# Patient Record
Sex: Female | Born: 1965 | Race: White | Hispanic: No | Marital: Married | Smoking: Current every day smoker
Health system: Southern US, Community
[De-identification: ages and names within clinical notes are randomized; demographics above are authoritative.]

## PROBLEM LIST (undated history)

## (undated) DIAGNOSIS — E282 Polycystic ovarian syndrome: Secondary | ICD-10-CM

## (undated) DIAGNOSIS — Z8719 Personal history of other diseases of the digestive system: Secondary | ICD-10-CM

## (undated) DIAGNOSIS — J189 Pneumonia, unspecified organism: Secondary | ICD-10-CM

## (undated) DIAGNOSIS — M1990 Unspecified osteoarthritis, unspecified site: Secondary | ICD-10-CM

## (undated) DIAGNOSIS — I6529 Occlusion and stenosis of unspecified carotid artery: Secondary | ICD-10-CM

## (undated) DIAGNOSIS — D649 Anemia, unspecified: Secondary | ICD-10-CM

## (undated) DIAGNOSIS — S41112S Laceration without foreign body of left upper arm, sequela: Secondary | ICD-10-CM

## (undated) DIAGNOSIS — F329 Major depressive disorder, single episode, unspecified: Secondary | ICD-10-CM

## (undated) DIAGNOSIS — E7800 Pure hypercholesterolemia, unspecified: Secondary | ICD-10-CM

## (undated) DIAGNOSIS — K219 Gastro-esophageal reflux disease without esophagitis: Secondary | ICD-10-CM

## (undated) DIAGNOSIS — T8859XA Other complications of anesthesia, initial encounter: Secondary | ICD-10-CM

## (undated) DIAGNOSIS — J45909 Unspecified asthma, uncomplicated: Secondary | ICD-10-CM

## (undated) DIAGNOSIS — I5032 Chronic diastolic (congestive) heart failure: Secondary | ICD-10-CM

## (undated) DIAGNOSIS — G4730 Sleep apnea, unspecified: Secondary | ICD-10-CM

## (undated) DIAGNOSIS — J40 Bronchitis, not specified as acute or chronic: Secondary | ICD-10-CM

## (undated) DIAGNOSIS — F419 Anxiety disorder, unspecified: Secondary | ICD-10-CM

## (undated) DIAGNOSIS — J449 Chronic obstructive pulmonary disease, unspecified: Secondary | ICD-10-CM

## (undated) DIAGNOSIS — R0609 Other forms of dyspnea: Secondary | ICD-10-CM

## (undated) DIAGNOSIS — M797 Fibromyalgia: Secondary | ICD-10-CM

## (undated) DIAGNOSIS — F32A Depression, unspecified: Secondary | ICD-10-CM

## (undated) DIAGNOSIS — R0600 Dyspnea, unspecified: Secondary | ICD-10-CM

## (undated) DIAGNOSIS — E669 Obesity, unspecified: Secondary | ICD-10-CM

## (undated) DIAGNOSIS — C801 Malignant (primary) neoplasm, unspecified: Secondary | ICD-10-CM

## (undated) DIAGNOSIS — I2510 Atherosclerotic heart disease of native coronary artery without angina pectoris: Secondary | ICD-10-CM

## (undated) DIAGNOSIS — B977 Papillomavirus as the cause of diseases classified elsewhere: Secondary | ICD-10-CM

## (undated) DIAGNOSIS — Z9289 Personal history of other medical treatment: Secondary | ICD-10-CM

## (undated) DIAGNOSIS — Z720 Tobacco use: Secondary | ICD-10-CM

## (undated) DIAGNOSIS — E785 Hyperlipidemia, unspecified: Secondary | ICD-10-CM

## (undated) DIAGNOSIS — I219 Acute myocardial infarction, unspecified: Secondary | ICD-10-CM

## (undated) DIAGNOSIS — I10 Essential (primary) hypertension: Secondary | ICD-10-CM

## (undated) HISTORY — PX: TONSILLECTOMY: SUR1361

## (undated) HISTORY — PX: CORONARY ANGIOPLASTY WITH STENT PLACEMENT: SHX49

## (undated) HISTORY — DX: Anemia, unspecified: D64.9

## (undated) HISTORY — PX: OTHER SURGICAL HISTORY: SHX169

## (undated) HISTORY — DX: Occlusion and stenosis of unspecified carotid artery: I65.29

---

## 1998-04-14 ENCOUNTER — Emergency Department (HOSPITAL_COMMUNITY): Admission: EM | Admit: 1998-04-14 | Discharge: 1998-04-14 | Payer: Self-pay | Admitting: Emergency Medicine

## 1998-05-24 ENCOUNTER — Encounter: Admission: RE | Admit: 1998-05-24 | Discharge: 1998-05-24 | Payer: Self-pay | Admitting: *Deleted

## 1999-01-24 ENCOUNTER — Encounter (HOSPITAL_COMMUNITY): Admission: RE | Admit: 1999-01-24 | Discharge: 1999-02-07 | Payer: Self-pay

## 1999-10-08 ENCOUNTER — Encounter: Payer: Self-pay | Admitting: Family Medicine

## 1999-10-08 ENCOUNTER — Encounter: Admission: RE | Admit: 1999-10-08 | Discharge: 1999-10-08 | Payer: Self-pay | Admitting: Family Medicine

## 2001-03-11 ENCOUNTER — Encounter: Payer: Self-pay | Admitting: Family Medicine

## 2001-03-11 ENCOUNTER — Encounter: Admission: RE | Admit: 2001-03-11 | Discharge: 2001-03-11 | Payer: Self-pay | Admitting: Family Medicine

## 2001-10-06 ENCOUNTER — Other Ambulatory Visit: Admission: RE | Admit: 2001-10-06 | Discharge: 2001-10-06 | Payer: Self-pay | Admitting: Gynecology

## 2003-03-03 ENCOUNTER — Other Ambulatory Visit: Admission: RE | Admit: 2003-03-03 | Discharge: 2003-03-03 | Payer: Self-pay | Admitting: Obstetrics and Gynecology

## 2003-04-04 ENCOUNTER — Ambulatory Visit (HOSPITAL_COMMUNITY): Admission: RE | Admit: 2003-04-04 | Discharge: 2003-04-04 | Payer: Self-pay | Admitting: Obstetrics and Gynecology

## 2003-04-04 ENCOUNTER — Encounter (INDEPENDENT_AMBULATORY_CARE_PROVIDER_SITE_OTHER): Payer: Self-pay

## 2003-05-11 ENCOUNTER — Other Ambulatory Visit (HOSPITAL_COMMUNITY): Admission: RE | Admit: 2003-05-11 | Discharge: 2003-05-22 | Payer: Self-pay | Admitting: Psychiatry

## 2003-11-16 ENCOUNTER — Encounter: Admission: RE | Admit: 2003-11-16 | Discharge: 2003-11-16 | Payer: Self-pay | Admitting: Psychiatry

## 2004-07-31 ENCOUNTER — Other Ambulatory Visit (HOSPITAL_COMMUNITY): Admission: RE | Admit: 2004-07-31 | Discharge: 2004-08-17 | Payer: Self-pay | Admitting: Psychiatry

## 2004-07-31 ENCOUNTER — Ambulatory Visit: Payer: Self-pay | Admitting: Psychiatry

## 2005-12-26 ENCOUNTER — Other Ambulatory Visit: Admission: RE | Admit: 2005-12-26 | Discharge: 2005-12-26 | Payer: Self-pay | Admitting: Obstetrics and Gynecology

## 2006-01-21 ENCOUNTER — Other Ambulatory Visit (HOSPITAL_COMMUNITY): Admission: RE | Admit: 2006-01-21 | Discharge: 2006-02-20 | Payer: Self-pay | Admitting: Psychiatry

## 2006-01-22 ENCOUNTER — Ambulatory Visit: Payer: Self-pay | Admitting: Psychiatry

## 2008-08-11 ENCOUNTER — Ambulatory Visit (HOSPITAL_COMMUNITY): Admission: RE | Admit: 2008-08-11 | Discharge: 2008-08-11 | Payer: Self-pay | Admitting: Obstetrics and Gynecology

## 2008-08-11 ENCOUNTER — Encounter (INDEPENDENT_AMBULATORY_CARE_PROVIDER_SITE_OTHER): Payer: Self-pay | Admitting: Obstetrics and Gynecology

## 2008-08-31 ENCOUNTER — Inpatient Hospital Stay (HOSPITAL_COMMUNITY): Admission: AD | Admit: 2008-08-31 | Discharge: 2008-09-01 | Payer: Self-pay | Admitting: Obstetrics and Gynecology

## 2008-09-01 IMAGING — US US PELVIS COMPLETE MODIFY
1 series · 13 of 25 positions shown · non-contrast
Comparison: None

CLINICAL DATA: Right lower quadrant pain.  Status post endometrial
ablation on [DATE].  Postop endometritis.

TRANSABDOMINAL AND TRANSVAGINAL ULTRASOUND OF PELVIS
TECHNIQUE: Both transabdominal and transvaginal ultrasound
examinations of the pelvis were performed including evaluation of
the uterus, ovaries, adnexal regions, and pelvic cul-de-sac.

[Series 1: us pelvis complete modify · 0.28mm/px · 13 of 51 slices shown]
[im 1/51]
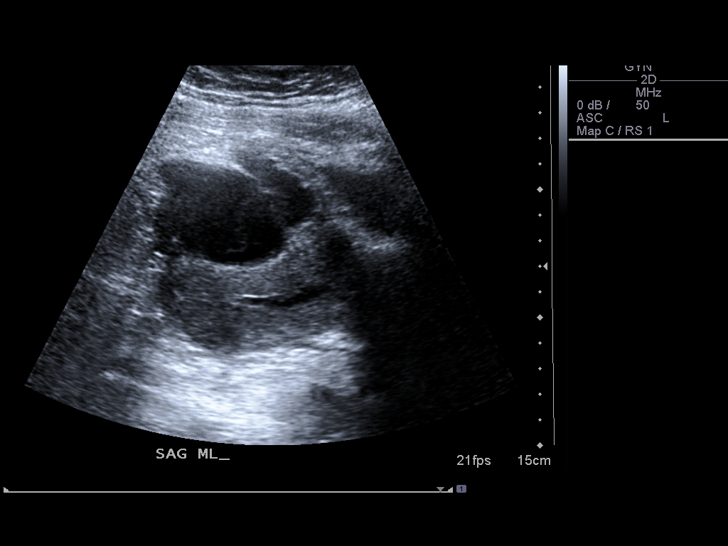
[im 5/51]
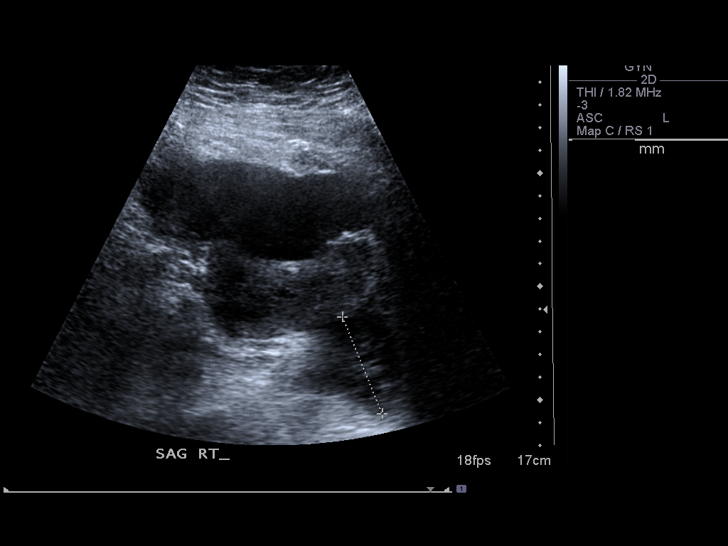
[im 9/51]
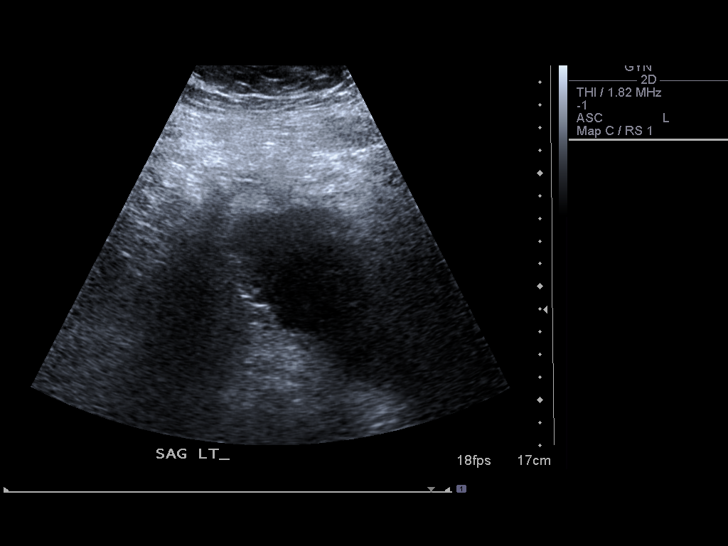
[im 13/51]
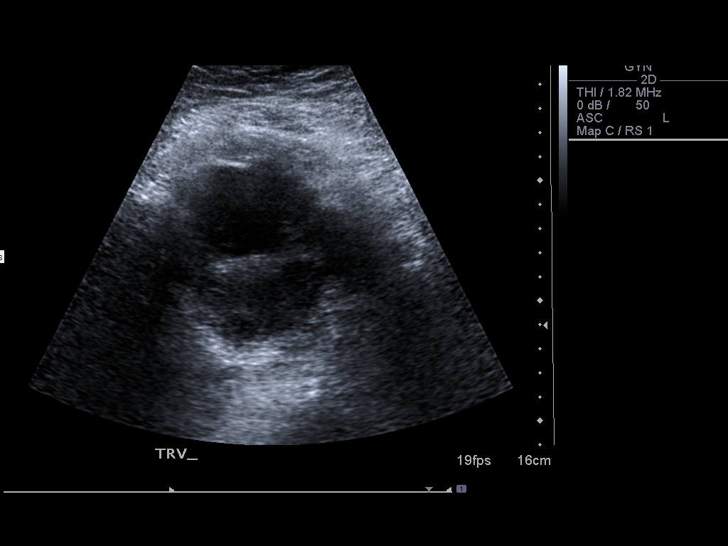
[im 17/51]
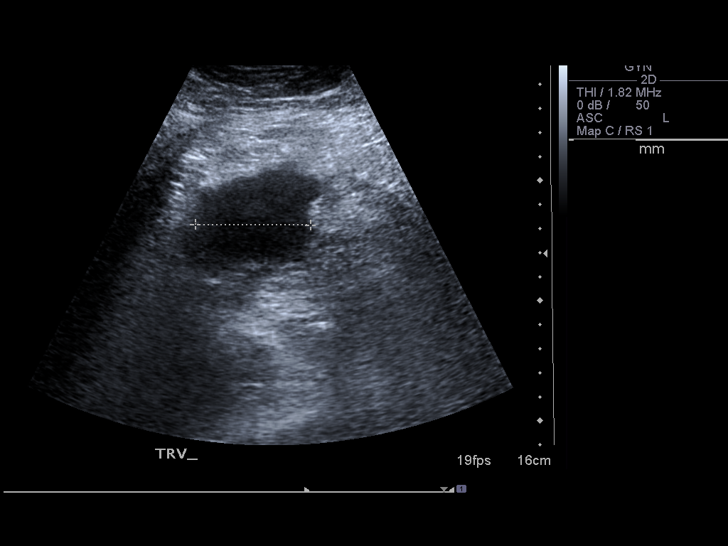
[im 21/51]
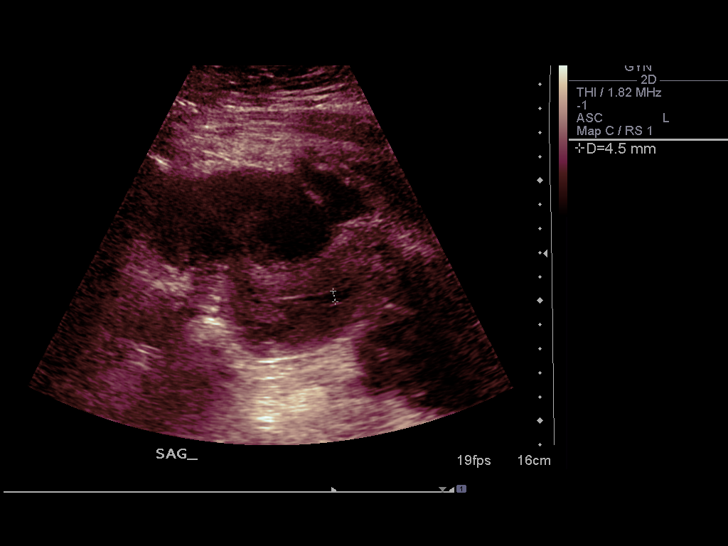
[im 26/51]
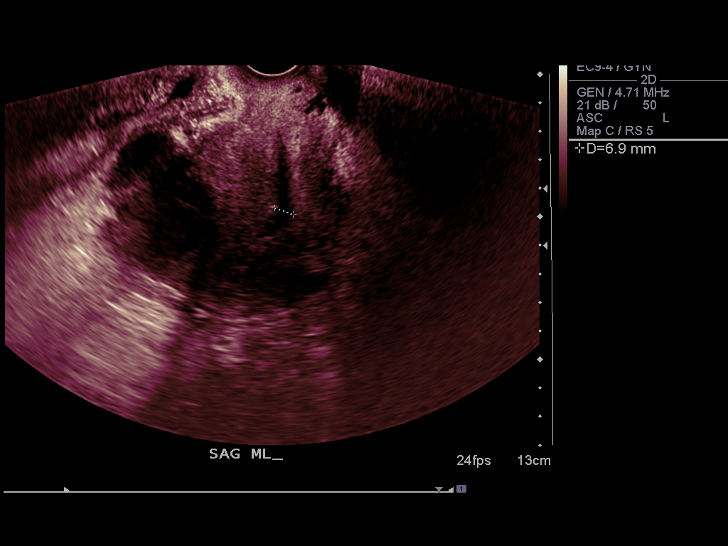
[im 30/51]
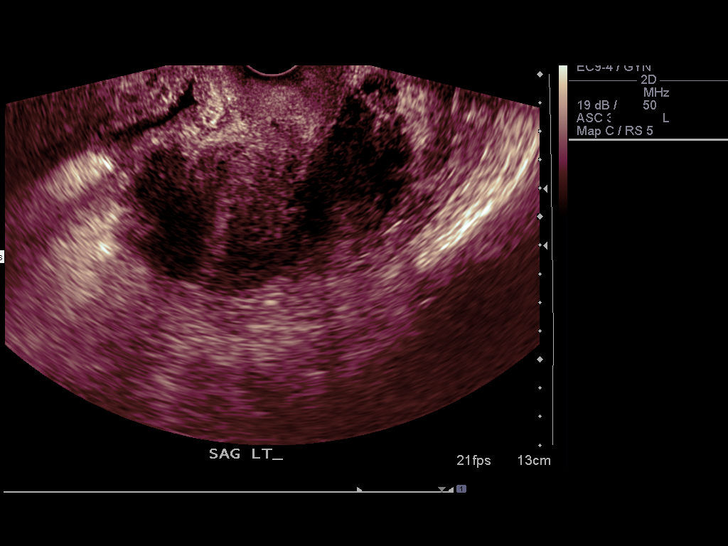
[im 34/51]
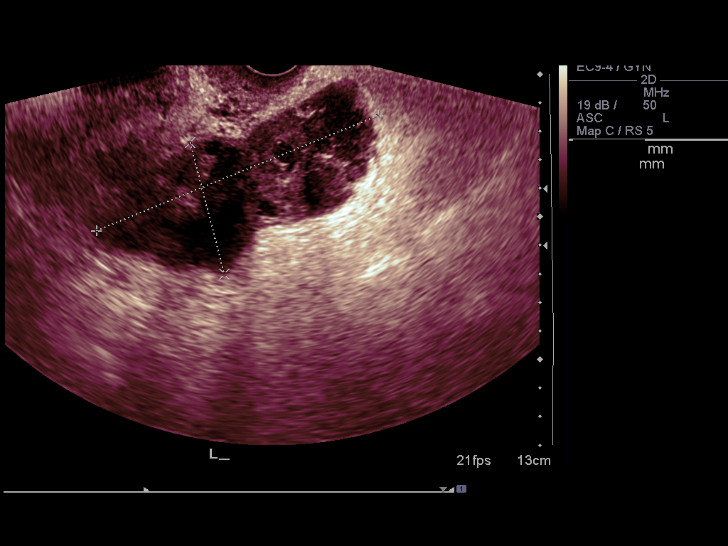
[im 38/51]
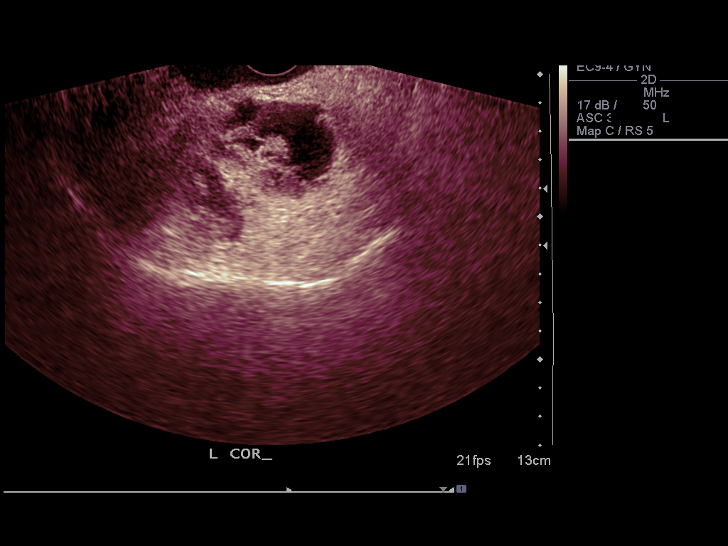
[im 42/51]
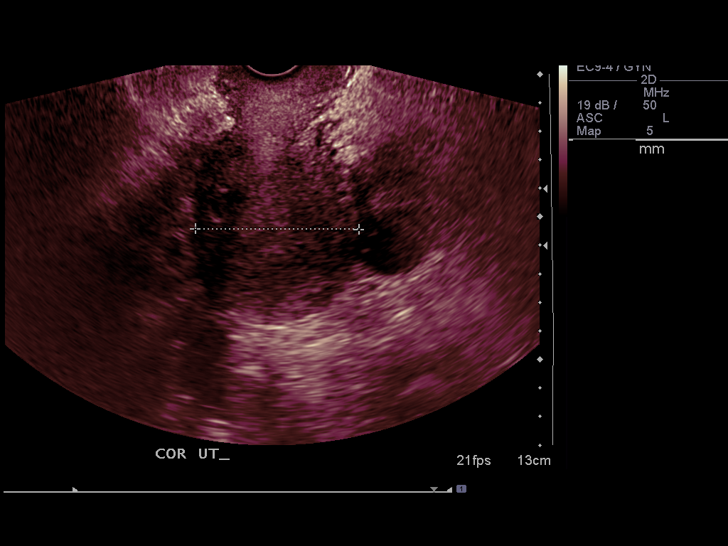
[im 46/51]
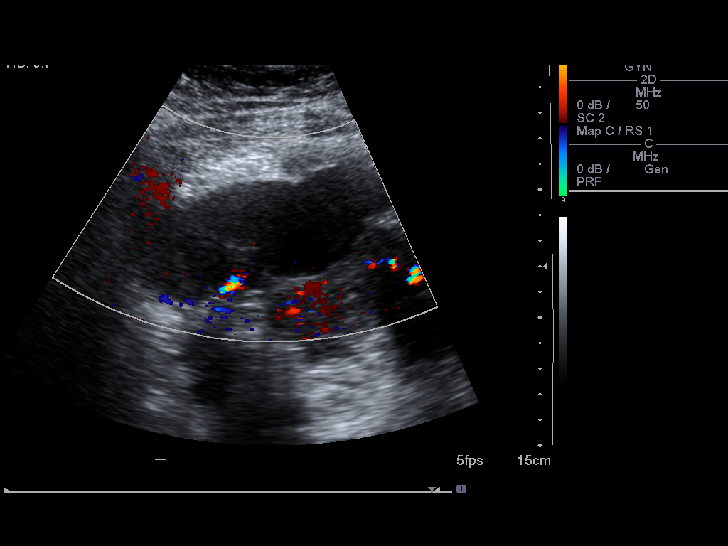
[im 51/51]
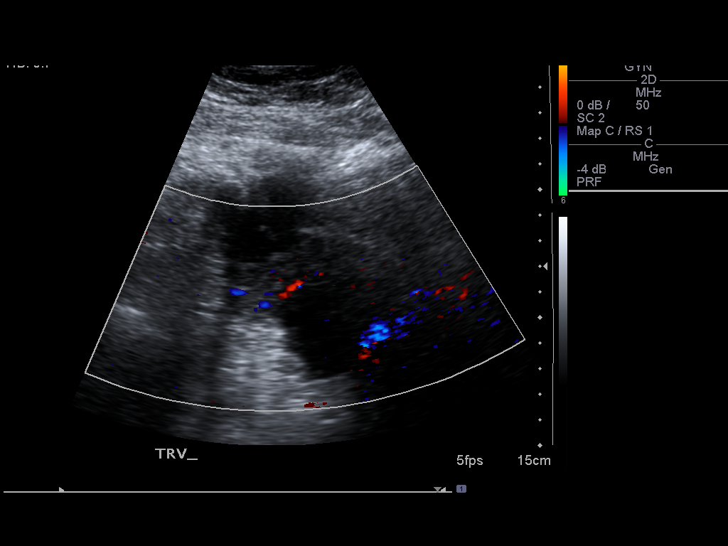

[13 of 25 positions shown; findings below may reference images not displayed]

FINDINGS: The uterus demonstrates a sagittal length of 10.1 cm, an
AP width of 5.8 cm and a transverse width of 5.7 cm.  One area of
focally altered echotexture is identified in the posterior upper
uterine segment portion of the myometrium measuring 1.9 x 1.5 cm
and is compatible with a small focal fibroid.

A small amount of fluid is identified within the endometrial canal.
The finding of note is the presence of bilateral adnexal
collections. On the left this measures 10.7 x 4.8 x 5.6 cm.  A
portion of this collection has imaging characteristics suggestive
of some ovarian tissue although a separate ovary cannot be clearly
delineated and the findings are most suggestive of a tubo-ovarian
abscess.

The collection on the right is located above the level of the
fundus of the uterus measuring 10.0 x 4.3 x 5.0 cm.  This
collection is more difficult to evaluate as it can be seen well
only transabdominally.  Given the findings on the other side, this
may represent a similar process on the right.

No free fluid is noted within the pelvis.
IMPRESSION: Small uterine fibroid with sizes location as noted above.  Small
amount of simple fluid within the endometrial canal in this patient
post endometrial ablation.

Bilateral adnexal collections  with the appearance on the left most
compatible with a tubo-ovarian abscess.  The right collection is
poorly evaluated as it is seen well only transabdominally but this
likely represents a similar process to that on the left.  If
desired, further evaluation with CT would likely be able to more
clearly delineate the extent of these bilateral collections.

Because of these findings, this report was discussed with Dr.
GLENROY at the time of this reading.

## 2009-08-09 ENCOUNTER — Encounter: Payer: Self-pay | Admitting: Cardiology

## 2009-09-04 ENCOUNTER — Ambulatory Visit: Payer: Self-pay | Admitting: Cardiology

## 2009-09-04 DIAGNOSIS — R079 Chest pain, unspecified: Secondary | ICD-10-CM

## 2009-09-04 DIAGNOSIS — R0602 Shortness of breath: Secondary | ICD-10-CM

## 2009-09-06 ENCOUNTER — Telehealth (INDEPENDENT_AMBULATORY_CARE_PROVIDER_SITE_OTHER): Payer: Self-pay

## 2009-09-10 ENCOUNTER — Ambulatory Visit: Payer: Self-pay | Admitting: Cardiology

## 2009-09-10 ENCOUNTER — Ambulatory Visit: Payer: Self-pay

## 2009-09-10 ENCOUNTER — Encounter (HOSPITAL_COMMUNITY): Admission: RE | Admit: 2009-09-10 | Discharge: 2009-09-28 | Payer: Self-pay | Admitting: Cardiology

## 2009-09-10 LAB — CONVERTED CEMR LAB
ALT: 18 units/L (ref 0–35)
AST: 18 units/L (ref 0–37)
Albumin: 3.9 g/dL (ref 3.5–5.2)
Alkaline Phosphatase: 67 units/L (ref 39–117)
BUN: 6 mg/dL (ref 6–23)
Bilirubin, Direct: 0 mg/dL (ref 0.0–0.3)
CO2: 27 meq/L (ref 19–32)
Calcium: 9.2 mg/dL (ref 8.4–10.5)
Chloride: 103 meq/L (ref 96–112)
Cholesterol: 294 mg/dL — ABNORMAL HIGH (ref 0–200)
Creatinine, Ser: 1.1 mg/dL (ref 0.4–1.2)
Direct LDL: 200.3 mg/dL
GFR calc non Af Amer: 56.98 mL/min (ref >60)
Glucose, Bld: 93 mg/dL (ref 70–99)
HDL: 39 mg/dL — ABNORMAL LOW (ref >39.00)
Potassium: 4.6 meq/L (ref 3.5–5.1)
Pro B Natriuretic peptide (BNP): 29 pg/mL (ref 0.0–100.0)
Sodium: 137 meq/L (ref 135–145)
Total Bilirubin: 0.7 mg/dL (ref 0.3–1.2)
Total CHOL/HDL Ratio: 8
Total Protein: 7.6 g/dL (ref 6.0–8.3)
Triglycerides: 244 mg/dL — ABNORMAL HIGH (ref 0.0–149.0)
VLDL: 48.8 mg/dL — ABNORMAL HIGH (ref 0.0–40.0)

## 2009-09-14 ENCOUNTER — Telehealth: Payer: Self-pay | Admitting: Cardiology

## 2009-10-09 ENCOUNTER — Encounter: Payer: Self-pay | Admitting: Cardiology

## 2010-10-31 NOTE — Letter (Signed)
Summary: [facility]   Imported By: Kassie Mends 10/16/2009 08:21:38  _____________________________________________________________________  External Attachment:    Type:   Image     Comment:   External Document

## 2010-10-31 NOTE — Letter (Signed)
Summary: letter to follow-up on B/P  [facility] [employer], Main Office  [address]   [place], [place] [address]   Phone: [phone]  Fax: [phone]        October 09, 2009 MRN: [id number]    [facility] [address] [place], [place]  [address]    Dear Ms. SASSANO,   I have been unable to reach you by telephone to see how your blood pressure has been. Please call me.        Sincerely,  Katina Dung, RN, BSN  This letter has been electronically signed by your physician.

## 2011-02-11 NOTE — H&P (Signed)
NAMEBETTEJANE, LEAVENS                 ACCOUNT NO.:  [id number]   MEDICAL RECORD NO.:  [id number]          PATIENT TYPE:  INP   LOCATION:  9306                          [facility]ILITY:  [facility]   PHYSICIAN:  Michelle L. Grewal, M.D.DATE OF BIRTH:  25-Apr-1966   DATE OF ADMISSION:  08/31/2008  DATE OF DISCHARGE:                              HISTORY & PHYSICAL   HISTORY OF PRESENT ILLNESS:  This 45 year old gravida 0 status post  diagnostic hysteroscopy, D&C, and ThermaChoice endometrial ablation on  August 11, 2008.  Surgery was uncomplicated.  The surgery was for  menorrhagia.  The patient several days after surgery had developed some  lower abdominal pain and low-grade fever.  She was given prescriptions  for antibiotics which she did not immediately take because of her  concern of how it might affect her GI tract.  She returned to the office  for a check up and was diagnosed with endometritis.  The patient was  given a dose of Rocephin and Toradol.  She was also given a prescription  for Keflex.  She was then advised to follow up within a few days. She  returned after few days reporting that she did not take the Keflex,  because she was concerned about taking antibiotics despite being  counseled to do so.  She was then given a prescription of Z-Pak at that  point.  She then took the Z-Pak.  She called in today complaining of a  sharp shooting pain from the right ovary, last night it started at 6  o'clock.  She also had a low-grade fever and then spiked the temperature  up to a 101.  She is having some yellowish vaginal discharge and some  urinary frequency.  She has to strain for her bowel movement and she is  having some constipation.  She feels lousy.   PAST MEDICAL HISTORY:  Unremarkable.   PAST SURGICAL HISTORY:  Tonsillectomy, hysteroscopy, D&C, and  ThermaChoice endometrial ablation.   MEDICATIONS:  Lexapro, Xanax, Vicodin, and Advil.   ALLERGIES:  She has no known allergies.   SOCIAL HISTORY:  She is not a smoker.   REVIEW OF SYSTEMS:  Unremarkable.   PHYSICAL EXAMINATION:  VITAL SIGNS:  Blood pressure 118/78 and  temperature 99.2.  Urine shows 1+ white blood cell and 1+ blood.  LUNGS:  Clear to auscultation bilaterally.  CARDIAC:  Regular rate and rhythm.  ABDOMEN:  Soft.  Some mild tenderness in the lower quadrant, but no  rebound.  No guarding.  Nondistended.  She has good bowel sounds.  PELVIC:  External genitalia within normal limits.  Vagina, moderate  amount of yellow discharge noted.  Cervix appears normal.  Bimanual  exam, moderate cervical motion tenderness.  Uterus is very tender with  movement and she has bilateral adnexal tenderness.   IMPRESSION:  1. Status post hysteroscopy, ThermaChoice endometrial ablation, and      dilation and curettage.  2. Endometritis/pelvic inflammatory disease.   PLAN:  Admit to [facility] for IV ampicillin, gentamicin, and  clindamycin.  I have been concerned about the patient's compliance with  outpatient oral antibiotic use.  So, I will admit her for IV  antibiotics.  Also, would give her Toradol tonight.  We will obtain an  admission CBC with differential and a repeat in the morning.  Obtain  pelvic ultrasound tomorrow.  Also, we will send urine for culture and  sensitivity and we will perform sitz bath tonight.  Plan of care  discussed with the patient.  All questions were answered.      Michelle L. Vincente Poli, M.D.  Electronically Signed     MLG/MEDQ  D:  08/31/2008  T:  09/01/2008  Job:  [id number]

## 2011-02-14 NOTE — Op Note (Signed)
Shannon [name], Shannon [name]                 ACCOUNT NO.:  [id number]   MEDICAL RECORD NO.:  [id number]          PATIENT TYPE:  AMB   LOCATION:  SDC                           [facility]  [facility]   PHYSICIAN:  Michelle L. Grewal, M.D.DATE OF BIRTH:  01/03/1966   DATE OF PROCEDURE:  08/14/2008  DATE OF DISCHARGE:                               OPERATIVE REPORT   PREOPERATIVE DIAGNOSIS:  Menorrhagia.   POSTOPERATIVE DIAGNOSIS:  Menorrhagia.   PROCEDURE:  D&C hysteroscopy, ThermaChoice endometrial ablation.   SURGEON:  Michelle L. Vincente Poli, MD   ANESTHESIA:  MAC with local.   FINDINGS:  Normal cavity.   SPECIMEN:  Uterine curettings sent to Pathology.   ESTIMATED BLOOD LOSS:  Minimal.   COMPLICATIONS:  None.   PROCEDURE:  The patient was taken to the operating room.  Anesthesia was  administered.  She was prepped and draped in usual fashion.  In-and-out  catheter was used to empty the bladder.  Speculum was inserted to the  vagina.  The cervix was grasped with a tenaculum and paracervical block  was performed in standard fashion.  The cervical internal os was gently  dilated using Pratt dilators.  The hysteroscope was inserted to the  uterine cavity with excellent visualization.  I found no intracavitary  lesion.  The hysteroscope was removed.  A sharp curette was inserted and  a thorough sharp uterine curettage was performed.  All tissue was sent  to Pathology.  The ThermaChoice III balloon was inserted and  ThermaChoice endometrial ablation was performed for 8 minutes according  to [employer] specifications.  The intact balloon was removed.  All  sponge, lap, and instrument counts were correct x2.  The patient went to  recovery room in stable condition.      Michelle L. Vincente Poli, M.D.  Electronically Signed     MLG/MEDQ  D:  08/11/2008  T:  08/11/2008  Job:  [id number]

## 2011-02-14 NOTE — Op Note (Signed)
NAME:  Shannon [name], Shannon [name]                           ACCOUNT NO.:  [id number]   MEDICAL RECORD NO.:  [id number]                   PATIENT TYPE:  AMB   LOCATION:  SDC                                  [facility]   PHYSICIAN:  Michelle L. Vincente Poli, M.D.            DATE OF BIRTH:  1965/12/10   DATE OF PROCEDURE:  04/04/2003  DATE OF DISCHARGE:                                 OPERATIVE REPORT   PREOPERATIVE DIAGNOSES:  1. Cervical intraepithelial neoplasia III.  2. Endometrial polyp.   POSTOPERATIVE DIAGNOSES:  1. Cervical intraepithelial neoplasia III.  2. Endometrial polyp.   PROCEDURE:  Wide local excision of CIN III lesion on right vulva, removal of  upper segmental  lesion of left cheek next to vulva in the ___________.   SURGEON:  Michelle L. Vincente Poli, M.D.   ANESTHESIA:  MAC with local.   ESTIMATED BLOOD LOSS:  Minimal.   FINDINGS:  1. The bump of tissue was low to the vagina.  2. Lesion on right vulva.  3. Small hyperpigmented lesion on left cheek.   COMPLICATIONS:  None.   PROCEDURE:  The patient was taken to the operating room.  She was given  sedation and placed in the low lithotomy position.  The abs, lower abdomen,  vagina, and vulva were prepped and draped in the usual sterile fashion.  In-  and-out catheter was used to empty the bladder.  Speculum was inserted into  the vagina and cervix was grasped with a tenaculum in a standard fashion.  The cervical internal os was gently dilated with the [facility] dilators and  diagnostic hysteroscope was inserted into the uterus.  Despite good  distention there was a moderate amount of blood clot in the uterus, so  visualization was limited, but there was some abundant tissue noted.  The  hysteroscope was removed and sharp curette was used to thoroughly curette  the uterus and soft tissue.  The soft tissue was sent to pathology.  The  speculum was removed.  All instruments were removed from the vagina.  Attention was then turned  to the vulva.   Exam of the vulva revealed a previous lesion from which I biopsied right  _______ at approximately 7 o'clock.  The local was infiltrated around it,  and using the scalpel it was removed in its entirety with a good margin.  This was sent to pathology for analysis.  The base of the wound was then  [facility] for hemostasis and then using 3-0 Vicryl interrupted the skin was  closed.  Bacitracin was then placed at the incision.   Attention was then turned to the left cheek at approximately 7 o'clock on  the left side, just a little bit further out from the vulva.  There was a  small 0.5 cm hyperpigmented lesion, and after local was infiltrated, it was  excised and the base was Bovied and then Bacitracin was applied  to this.  The patient was taken to the recovery room in stable condition.  All sponge,  lap, and instrument counts were correct x 2.  She will have an ice pack  placed to the vulva and follow up with me in one week.                                               Michelle L. Vincente Poli, M.D.    Florestine Avers  D:  04/04/2003  T:  04/04/2003  Job:  [id number]

## 2011-03-08 ENCOUNTER — Emergency Department (HOSPITAL_COMMUNITY)
Admission: EM | Admit: 2011-03-08 | Discharge: 2011-03-08 | Disposition: A | Discharge status: Home / Self Care | Payer: BC Managed Care – PPO | Attending: Emergency Medicine | Admitting: Emergency Medicine

## 2011-03-08 DIAGNOSIS — R112 Nausea with vomiting, unspecified: Secondary | ICD-10-CM | POA: Insufficient documentation

## 2011-03-08 DIAGNOSIS — N12 Tubulo-interstitial nephritis, not specified as acute or chronic: Secondary | ICD-10-CM | POA: Insufficient documentation

## 2011-03-08 DIAGNOSIS — I10 Essential (primary) hypertension: Secondary | ICD-10-CM | POA: Insufficient documentation

## 2011-03-08 DIAGNOSIS — R300 Dysuria: Secondary | ICD-10-CM | POA: Insufficient documentation

## 2011-03-08 DIAGNOSIS — R6883 Chills (without fever): Secondary | ICD-10-CM | POA: Insufficient documentation

## 2011-03-08 LAB — URINALYSIS, ROUTINE W REFLEX MICROSCOPIC
Glucose, UA: NEGATIVE mg/dL
Protein, ur: NEGATIVE mg/dL
pH: 6 (ref 5.0–8.0)

## 2011-03-08 LAB — BASIC METABOLIC PANEL
CO2: 20 mEq/L (ref 19–32)
Calcium: 8.6 mg/dL (ref 8.4–10.5)
Chloride: 97 mEq/L (ref 96–112)
Creatinine, Ser: 1.1 mg/dL (ref 0.4–1.2)
Glucose, Bld: 122 mg/dL — ABNORMAL HIGH (ref 70–99)
Sodium: 129 mEq/L — ABNORMAL LOW (ref 135–145)

## 2011-03-08 LAB — URINE MICROSCOPIC-ADD ON

## 2011-03-08 LAB — POCT PREGNANCY, URINE: Preg Test, Ur: NEGATIVE

## 2011-03-11 LAB — URINE CULTURE

## 2011-07-01 LAB — CBC
Hemoglobin: 14.1
MCHC: 32.7
RBC: 5.25 — ABNORMAL HIGH
WBC: 8.7

## 2011-07-03 LAB — COMPREHENSIVE METABOLIC PANEL
BUN: 7 mg/dL (ref 6–23)
CO2: 25 mEq/L (ref 19–32)
Calcium: 8.5 mg/dL (ref 8.4–10.5)
Chloride: 97 mEq/L (ref 96–112)
Creatinine, Ser: 0.81 mg/dL (ref 0.4–1.2)
GFR calc non Af Amer: >60 mL/min (ref >60)
Glucose, Bld: 100 mg/dL — ABNORMAL HIGH (ref 70–99)
Total Bilirubin: 0.4 mg/dL (ref 0.3–1.2)

## 2011-07-03 LAB — URINALYSIS, ROUTINE W REFLEX MICROSCOPIC
Bilirubin Urine: NEGATIVE
Nitrite: POSITIVE — AB
Specific Gravity, Urine: 1.025 (ref 1.005–1.030)
Urobilinogen, UA: 0.2 mg/dL (ref 0.0–1.0)
pH: 5.5 (ref 5.0–8.0)

## 2011-07-03 LAB — DIFFERENTIAL
Basophils Absolute: 0.1 10*3/uL (ref 0.0–0.1)
Basophils Relative: 1 % (ref 0–1)
Eosinophils Absolute: 0.1 10*3/uL (ref 0.0–0.7)
Eosinophils Absolute: 0.1 10*3/uL (ref 0.0–0.7)
Eosinophils Relative: 1 % (ref 0–5)
Lymphocytes Relative: 17 % (ref 12–46)
Lymphs Abs: 2.3 10*3/uL (ref 0.7–4.0)
Monocytes Absolute: 1.1 10*3/uL — ABNORMAL HIGH (ref 0.1–1.0)
Monocytes Relative: 8 % (ref 3–12)
Neutro Abs: 8.3 10*3/uL — ABNORMAL HIGH (ref 1.7–7.7)
Neutrophils Relative %: 75 % (ref 43–77)

## 2011-07-03 LAB — URINE CULTURE: Colony Count: >=100000

## 2011-07-03 LAB — URINE MICROSCOPIC-ADD ON

## 2011-07-03 LAB — CBC
HCT: 31.9 % — ABNORMAL LOW (ref 36.0–46.0)
HCT: 33.5 % — ABNORMAL LOW (ref 36.0–46.0)
Hemoglobin: 10.9 g/dL — ABNORMAL LOW (ref 12.0–15.0)
MCV: 79.7 fL (ref 78.0–100.0)
MCV: 80.1 fL (ref 78.0–100.0)
Platelets: 294 10*3/uL (ref 150–400)
Platelets: 366 10*3/uL (ref 150–400)
RBC: 4 MIL/uL (ref 3.87–5.11)
RBC: 4.18 MIL/uL (ref 3.87–5.11)
WBC: 11.1 10*3/uL — ABNORMAL HIGH (ref 4.0–10.5)
WBC: 13.5 10*3/uL — ABNORMAL HIGH (ref 4.0–10.5)

## 2011-07-03 LAB — CREATININE, SERUM
GFR calc Af Amer: >60 mL/min (ref >60)
GFR calc non Af Amer: >60 mL/min (ref >60)

## 2012-11-15 ENCOUNTER — Other Ambulatory Visit: Payer: Self-pay | Admitting: Physician Assistant

## 2012-11-15 ENCOUNTER — Encounter (HOSPITAL_COMMUNITY)
Admission: AD | Disposition: A | Discharge status: Home / Self Care | Payer: Self-pay | Source: Other Acute Inpatient Hospital | Attending: Surgery

## 2012-11-15 ENCOUNTER — Inpatient Hospital Stay (HOSPITAL_COMMUNITY)
Admission: AD | Admit: 2012-11-15 | Discharge: 2012-11-16 | DRG: 853 | Disposition: A | Discharge status: Home / Self Care | Payer: BC Managed Care – PPO | Source: Other Acute Inpatient Hospital | Attending: Surgery | Admitting: Surgery

## 2012-11-15 DIAGNOSIS — I10 Essential (primary) hypertension: Secondary | ICD-10-CM | POA: Diagnosis present

## 2012-11-15 DIAGNOSIS — I214 Non-ST elevation (NSTEMI) myocardial infarction: Principal | ICD-10-CM

## 2012-11-15 DIAGNOSIS — R079 Chest pain, unspecified: Secondary | ICD-10-CM

## 2012-11-15 DIAGNOSIS — I2510 Atherosclerotic heart disease of native coronary artery without angina pectoris: Secondary | ICD-10-CM | POA: Diagnosis present

## 2012-11-15 DIAGNOSIS — E669 Obesity, unspecified: Secondary | ICD-10-CM | POA: Diagnosis present

## 2012-11-15 DIAGNOSIS — F17200 Nicotine dependence, unspecified, uncomplicated: Secondary | ICD-10-CM | POA: Diagnosis present

## 2012-11-15 DIAGNOSIS — Z955 Presence of coronary angioplasty implant and graft: Secondary | ICD-10-CM

## 2012-11-15 DIAGNOSIS — R0602 Shortness of breath: Secondary | ICD-10-CM

## 2012-11-15 DIAGNOSIS — Z6838 Body mass index (BMI) 38.0-38.9, adult: Secondary | ICD-10-CM

## 2012-11-15 DIAGNOSIS — F411 Generalized anxiety disorder: Secondary | ICD-10-CM | POA: Diagnosis present

## 2012-11-15 DIAGNOSIS — F3289 Other specified depressive episodes: Secondary | ICD-10-CM | POA: Diagnosis present

## 2012-11-15 DIAGNOSIS — F329 Major depressive disorder, single episode, unspecified: Secondary | ICD-10-CM | POA: Diagnosis present

## 2012-11-15 DIAGNOSIS — E785 Hyperlipidemia, unspecified: Secondary | ICD-10-CM | POA: Diagnosis present

## 2012-11-15 HISTORY — DX: Major depressive disorder, single episode, unspecified: F32.9

## 2012-11-15 HISTORY — DX: Essential (primary) hypertension: I10

## 2012-11-15 HISTORY — DX: Depression, unspecified: F32.A

## 2012-11-15 HISTORY — PX: LEFT HEART CATHETERIZATION WITH CORONARY ANGIOGRAM: SHX5451

## 2012-11-15 HISTORY — DX: Tobacco use: Z72.0

## 2012-11-15 HISTORY — DX: Anxiety disorder, unspecified: F41.9

## 2012-11-15 HISTORY — DX: Atherosclerotic heart disease of native coronary artery without angina pectoris: I25.10

## 2012-11-15 HISTORY — PX: PERCUTANEOUS CORONARY STENT INTERVENTION (PCI-S): SHX5485

## 2012-11-15 HISTORY — DX: Hyperlipidemia, unspecified: E78.5

## 2012-11-15 SURGERY — LEFT HEART CATHETERIZATION WITH CORONARY ANGIOGRAM
Anesthesia: LOCAL

## 2012-11-15 MED ORDER — SODIUM CHLORIDE 0.9 % IJ SOLN: 3.0000 mL | Freq: Two times a day (BID) | INTRAMUSCULAR | Status: DC

## 2012-11-15 MED ORDER — ACETAMINOPHEN 325 MG PO TABS: 650.0000 mg | ORAL_TABLET | ORAL | Status: DC | PRN

## 2012-11-15 MED ORDER — PRASUGREL HCL 10 MG PO TABS
10.0000 mg | ORAL_TABLET | Freq: Every day | ORAL | Status: DC
  Administered 2012-11-16: 11:00:00 10 mg via ORAL
  Filled 2012-11-15: qty 1

## 2012-11-15 MED ORDER — MIDAZOLAM HCL 2 MG/2ML IJ SOLN
INTRAMUSCULAR | Status: AC
  Filled 2012-11-15: qty 2

## 2012-11-15 MED ORDER — HYDROMORPHONE HCL PF 2 MG/ML IJ SOLN
INTRAMUSCULAR | Status: AC
  Filled 2012-11-15: qty 1

## 2012-11-15 MED ORDER — ONDANSETRON HCL 4 MG/2ML IJ SOLN: 4.0000 mg | Freq: Four times a day (QID) | INTRAMUSCULAR | Status: DC | PRN

## 2012-11-15 MED ORDER — METOPROLOL TARTRATE 25 MG PO TABS
25.0000 mg | ORAL_TABLET | Freq: Two times a day (BID) | ORAL | Status: DC
  Administered 2012-11-15 – 2012-11-16 (×2): 25 mg via ORAL
  Filled 2012-11-15 (×4): qty 1

## 2012-11-15 MED ORDER — ATORVASTATIN CALCIUM 80 MG PO TABS
80.0000 mg | ORAL_TABLET | Freq: Every day | ORAL | Status: DC
  Filled 2012-11-15 (×3): qty 1

## 2012-11-15 MED ORDER — SERTRALINE HCL 50 MG PO TABS
50.0000 mg | ORAL_TABLET | Freq: Every day | ORAL | Status: DC
  Administered 2012-11-15 – 2012-11-16 (×2): 50 mg via ORAL
  Filled 2012-11-15 (×3): qty 1

## 2012-11-15 MED ORDER — PRASUGREL HCL 10 MG PO TABS
ORAL_TABLET | ORAL | Status: AC
  Filled 2012-11-15: qty 6

## 2012-11-15 MED ORDER — METOPROLOL TARTRATE 25 MG PO TABS
25.0000 mg | ORAL_TABLET | Freq: Four times a day (QID) | ORAL | Status: DC
  Filled 2012-11-15 (×3): qty 1

## 2012-11-15 MED ORDER — ALPRAZOLAM 0.25 MG PO TABS
0.5000 mg | ORAL_TABLET | Freq: Three times a day (TID) | ORAL | Status: DC | PRN
  Administered 2012-11-15 – 2012-11-16 (×3): 0.5 mg via ORAL
  Filled 2012-11-15 (×3): qty 2

## 2012-11-15 MED ORDER — BIVALIRUDIN 250 MG IV SOLR
INTRAVENOUS | Status: AC
  Filled 2012-11-15: qty 250

## 2012-11-15 MED ORDER — SODIUM CHLORIDE 0.9 % IJ SOLN: 3.0000 mL | INTRAMUSCULAR | Status: DC | PRN

## 2012-11-15 MED ORDER — OXYCODONE-ACETAMINOPHEN 5-325 MG PO TABS: 2.0000 | ORAL_TABLET | Freq: Once | ORAL | Status: DC

## 2012-11-15 MED ORDER — LIDOCAINE HCL (PF) 1 % IJ SOLN
INTRAMUSCULAR | Status: AC
  Filled 2012-11-15: qty 30

## 2012-11-15 MED ORDER — SODIUM CHLORIDE 0.9 % IV SOLN: INTRAVENOUS | Status: DC

## 2012-11-15 MED ORDER — ASPIRIN 81 MG PO CHEW
324.0000 mg | CHEWABLE_TABLET | ORAL | Status: AC
  Administered 2012-11-15: 324 mg via ORAL

## 2012-11-15 MED ORDER — SODIUM CHLORIDE 0.9 % IV SOLN: 250.0000 mL | INTRAVENOUS | Status: DC | PRN

## 2012-11-15 MED ORDER — OXYCODONE-ACETAMINOPHEN 5-325 MG PO TABS
ORAL_TABLET | ORAL | Status: AC
  Filled 2012-11-15: qty 2

## 2012-11-15 MED ORDER — HYDROMORPHONE HCL PF 2 MG/ML IJ SOLN
1.0000 mg | Freq: Once | INTRAMUSCULAR | Status: AC
  Administered 2012-11-15: 1 mg via INTRAVENOUS

## 2012-11-15 MED ORDER — SODIUM CHLORIDE 0.9 % IV SOLN: 1.0000 mL/kg/h | INTRAVENOUS | Status: AC

## 2012-11-15 MED ORDER — HEPARIN (PORCINE) IN NACL 2-0.9 UNIT/ML-% IJ SOLN
INTRAMUSCULAR | Status: AC
  Filled 2012-11-15: qty 1000

## 2012-11-15 MED ORDER — FENTANYL CITRATE 0.05 MG/ML IJ SOLN
INTRAMUSCULAR | Status: AC
  Filled 2012-11-15: qty 2

## 2012-11-15 MED ORDER — NITROGLYCERIN 0.4 MG SL SUBL: 0.4000 mg | SUBLINGUAL_TABLET | SUBLINGUAL | Status: DC | PRN

## 2012-11-15 MED ORDER — NITROGLYCERIN 1 MG/10 ML FOR IR/CATH LAB
INTRA_ARTERIAL | Status: AC
  Filled 2012-11-15: qty 10

## 2012-11-15 MED ORDER — PANTOPRAZOLE SODIUM 40 MG PO TBEC
40.0000 mg | DELAYED_RELEASE_TABLET | Freq: Every day | ORAL | Status: DC
  Administered 2012-11-16: 13:00:00 40 mg via ORAL
  Filled 2012-11-15: qty 1

## 2012-11-15 MED ORDER — ASPIRIN EC 81 MG PO TBEC
81.0000 mg | DELAYED_RELEASE_TABLET | Freq: Every day | ORAL | Status: DC
  Administered 2012-11-16: 11:00:00 81 mg via ORAL
  Filled 2012-11-15: qty 1

## 2012-11-15 NOTE — Care Management Note (Addendum)
    Page 1 of 1   11/16/2012     2:07:22 PM   CARE MANAGEMENT NOTE 11/16/2012  Patient:  Shannon [name], Shannon [name]   Account Number:  [id number]  Date Initiated:  11/15/2012  Documentation initiated by:  GRAVES-BIGELOW,BRENDA  Subjective/Objective Assessment:   Pt admitted with cp plan for cath in am.     Action/Plan:   CM will continue to monitor for disposition needs.   Anticipated DC Date:  11/16/2012   Anticipated DC Plan:  HOME/SELF CARE      DC Planning Services  CM consult      Choice offered to / List presented to:             Status of service:  In process, will continue to follow Medicare Important Message given?   (If response is "NO", the following Medicare IM given date fields will be blank) Date Medicare IM given:   Date Additional Medicare IM given:    Discharge Disposition:    Per UR Regulation:  Reviewed for med. necessity/level of care/duration of stay  If discussed at Long Length of Stay Meetings, dates discussed:    Comments:  11/16/12 @1400 .Marland KitchenMarland KitchenOletta Cohn, RN, BSN, NCM, [phone] Notified Pt of co-pay via telephone.  11/16/12@1215 .Marland KitchenMarland KitchenOletta Cohn, RN, BSN, NCM, [phone] Recieved call from CMS stating that [employer] quoted that co-pay is listed on pt insurance card.  11/16/12 @ 1115.Marland KitchenMarland KitchenOletta Cohn, RN, BSN, NCM, [phone] Spoke with Pt about Effient drug and offered assistance through [employer] Patient Assistance Program related to Self Pay status.  Pt husband was able to find insurance card and now on file.  Informed Pt that [employer] Patient Assistance Program no longer an option we would complete [name] benfits check.

## 2012-11-15 NOTE — Progress Notes (Signed)
Pt arrived from [facility].  Onset of CP 2 days ago.  Her Troponin levels are mildly elevated - 0.32 .  Dr. McLean has seen her and has transferred her to  for urgent cath.  Lungs : clear Cor: RR Abd. Obese Ext:  No edema.  R femoral pulse is 2+ Right radial pulse is 1+  Agree to proceed with cardiac cath.    Aubrielle Stroud, MD 11/15/12 4:09 PM  

## 2012-11-15 NOTE — Progress Notes (Signed)
Site area: right groin  Site Prior to Removal:  Level 0  Pressure Applied For 20 MINUTES    Minutes Beginning at 1950  Manual:   yes  Patient Status During Pull:  AAO X 3  Post Pull Groin Site:  Level 0  Post Pull Instructions Given:  yes  Post Pull Pulses Present:  yes  Dressing Applied:  yes  Comments:  TOLERATED PROCEDURE WELL

## 2012-11-15 NOTE — H&P (View-Only) (Signed)
Pt arrived from [facility].  Onset of CP 2 days ago.  Her Troponin levels are mildly elevated - 0.32 .  Dr. Shirlee Latch has seen her and has transferred her to [facility] for urgent cath.  Lungs : clear Cor: RR Abd. Obese Ext:  No edema.  R femoral pulse is 2+ Right radial pulse is 1+  Agree to proceed with cardiac cath.    Kristeen Miss, MD 11/15/12 4:09 PM

## 2012-11-15 NOTE — Interval H&P Note (Signed)
History and Physical Interval Note:  11/15/2012 4:09 PM  Shannon [name]  has presented today for surgery, with the diagnosis of cp  The various methods of treatment have been discussed with the patient and family. After consideration of risks, benefits and other options for treatment, the patient has consented to  Procedure(s): LEFT HEART CATHETERIZATION WITH CORONARY ANGIOGRAM (N/A) as a surgical intervention .  The patient's history has been reviewed, patient examined, no change in status, stable for surgery.  I have reviewed the patient's chart and labs.  Questions were answered to the patient's satisfaction.     Elyn Aquas.

## 2012-11-15 NOTE — CV Procedure (Signed)
    Cardiac Cath Note  Shannon [name] [id number] [birth year]  Procedure: left  Heart Cardiac Catheterization Note Indications: Unstable angina  Procedure Details Consent: Obtained Time Out: Verified patient identification, verified procedure, site/side was marked, verified correct patient position, special equipment/implants available, Radiology Safety Procedures followed,  medications/allergies/relevent history reviewed, required imaging and test results available.  Performed   Medications: Fentanyl: 50 mcg IV Versed: 2 mg IV  The right femoral artery was easily canulated using a modified Seldinger technique.  LV pressure: 126/16 Aortic pressure: 124/76  Angiography   Left Main: minor luminal irregularities  Left anterior Descending: minor luminal irregularities,  10-20% stenosis.  1st diagonal - moderate sized, minor luminal irregularities  Left Circumflex: moderate in size, there is a tight 90% stenosis in the mid LCx. The lesion is eccentric.  The posterior lateral branch and the terminal LCx have mild diffuse disease  Right Coronary Artery: small, mild diffuse disease  LV Gram: overall well preserved LV function.  EF 60-65%  Complications: No apparent complications Patient did tolerate procedure well.  Contrast used: 60 cc  Conclusions:   Single vessel CAD involving the LCx.   Have discussed with Dr. Excell Seltzer. He will proceed with stenting of the LCx this afternoon.   Vesta Mixer, Montez Hageman., MD, [facility] 11/15/2012, 4:38 PM Office - [phone] Pager [phone]

## 2012-11-15 NOTE — CV Procedure (Signed)
   CARDIAC CATH NOTE  Name: Shannon [name] MRN: [id number] DOB: Dec 09, 1965  Procedure: PTCA and stenting of the LCx, failed PerClose of the RFA  Indication: NSTEMI  Procedural Details: The right groin was prepped, draped, and anesthetized with 1% lidocaine. Using the modified Seldinger technique, a 6 Fr sheath was introduced into the right femoral artery.  Weight-based bivalirudin was given for anticoagulation. Once a therapeutic ACT was achieved, a 6 [place] XB 3.5 cm guide catheter was inserted. Dr Elease Hashimoto (who did the diagnostic cath) and I agreed this was the likely 'cluprit lestion.' The patient was preloaded with Effient 60 mg on the cath lab table.  A Cougar coronary guidewire was used to cross the lesion.  The lesion was predilated with a 2.5 mm balloon.  The lesion was then stented with a 3.0x32 mm Promus Element drug-eluting stent.  The stent was postdilated with a 3.25 mm noncompliant balloon.  Following PCI, there was 0% residual stenosis and TIMI-3 flow. Final angiography confirmed an excellent result. The patient tolerated the procedure well. There were no immediate procedural complications. Femoral hemostasis was achieved with a Perclose device, but the device failed to capture the artery. I was able to readvance a 7 French sheath over the indwelling 0.035 cm wire. The groin site was stable without hematoma or bleeding. The patient was transferred to the post catheterization recovery area for further monitoring.  Lesion Data: Vessel: LCx Percent stenosis (pre): 95 TIMI-flow (pre):  3 Stent:  3.0x32 mm drug-eluting Percent stenosis (post): 0 TIMI-flow (post): 3  Conclusions: Successful PCI of the LCx with a single long DES  Recommendations: DAPT with ASA and effient for 12 months.  Tonny Bollman 11/15/2012, 5:20 PM

## 2012-11-16 ENCOUNTER — Encounter (HOSPITAL_COMMUNITY): Payer: Self-pay | Admitting: *Deleted

## 2012-11-16 DIAGNOSIS — I214 Non-ST elevation (NSTEMI) myocardial infarction: Secondary | ICD-10-CM

## 2012-11-16 LAB — CBC
HCT: 39.8 % (ref 36.0–46.0)
Hemoglobin: 12.9 g/dL (ref 12.0–15.0)
MCV: 89 fL (ref 78.0–100.0)
WBC: 8.9 10*3/uL (ref 4.0–10.5)

## 2012-11-16 LAB — BASIC METABOLIC PANEL
BUN: 10 mg/dL (ref 6–23)
CO2: 25 mEq/L (ref 19–32)
Chloride: 104 mEq/L (ref 96–112)
Creatinine, Ser: 0.96 mg/dL (ref 0.50–1.10)
Glucose, Bld: 90 mg/dL (ref 70–99)
Potassium: 4.1 mEq/L (ref 3.5–5.1)

## 2012-11-16 MED ORDER — ATORVASTATIN CALCIUM 80 MG PO TABS: 80.0000 mg | ORAL_TABLET | Freq: Every day | ORAL | Status: DC

## 2012-11-16 MED ORDER — METOPROLOL TARTRATE 25 MG PO TABS: 25.0000 mg | ORAL_TABLET | Freq: Two times a day (BID) | ORAL | Status: DC

## 2012-11-16 MED ORDER — NITROGLYCERIN 0.4 MG SL SUBL: 0.4000 mg | SUBLINGUAL_TABLET | SUBLINGUAL | Status: DC | PRN

## 2012-11-16 MED ORDER — ATORVASTATIN CALCIUM 40 MG PO TABS: 40.0000 mg | ORAL_TABLET | Freq: Every day | ORAL | Status: DC

## 2012-11-16 MED ORDER — PRASUGREL HCL 10 MG PO TABS: 10.0000 mg | ORAL_TABLET | Freq: Every day | ORAL | Status: DC

## 2012-11-16 MED ORDER — ASPIRIN 81 MG PO CHEW: 81.0000 mg | CHEWABLE_TABLET | Freq: Every day | ORAL | Status: DC

## 2012-11-16 MED FILL — Dextrose Inj 5%: INTRAVENOUS | Qty: 50 | Status: AC

## 2012-11-16 NOTE — Discharge Summary (Signed)
Discharge Summary   Patient ID: Shannon [name] MRN: [id number], DOB/AGE: [id number] 49 y.o.  Primary MD: Lenora Boys, MD Primary Cardiologist: Dr. Shirlee Latch Admit date: 11/15/2012 D/C date:     11/16/2012      Primary Discharge Diagnoses:  1. NSTEMI/CAD  - Cath showed 90% stenosis mid LCx and otherwise nonobstructive disease w/ EF 60-65%; S/p PTCA/DES to LCx  2. Hyperlipidemia  - Initiated on statin  - F/u lipid/LFTs in 6 weeks  3. Tobacco Abuse   Secondary Discharge Diagnoses:  . Anxiety   . Depression   . Hypertension    Allergies No Known Allergies  Diagnostic Studies/Procedures:  11/15/12 - Cardiac Cath  LV pressure: 126/16  Aortic pressure: 124/76  Angiography  Left Main: minor luminal irregularities  Left anterior Descending: minor luminal irregularities, 10-20% stenosis. 1st diagonal - moderate sized, minor luminal irregularities  Left Circumflex: moderate in size, there is a tight 90% stenosis in the mid LCx. The lesion is eccentric. The posterior lateral branch and the terminal LCx have mild diffuse disease  Right Coronary Artery: small, mild diffuse disease  LV Gram: overall well preserved LV function. EF 60-65%  Conclusions: Single vessel CAD involving the LCx. Have discussed with Dr. Excell Seltzer. He will proceed with stenting of the LCx this afternoon.  Lesion Data:  Vessel: LCx  Percent stenosis (pre): 95  TIMI-flow (pre): 3  Stent: 3.0x32 mm drug-eluting  Percent stenosis (post): 0  TIMI-flow (post): 3  Conclusions: Successful PCI of the LCx with a single long DES  Recommendations: DAPT with ASA and effient for 12 months   History of Present Illness: 47 y.o. female w/ the above medical problems who transferred from [facility] to [facility] on 11/05/12 with complaints of chest pain and mildly elevated troponin. She has no history of heart disease and had a normal stress test in 2010 after being evaluated by Dr. Shirlee Latch for chest pain and sob. She  presented to [facility] with complaints of chest pain x2 nights with radiation into her neck and jaw. EKG at [facility] reportedly showed NSR with <32mm ST depression in V3-4, CXR was without acute cardiopulmonary fidings, and troponin was mildly elevated at 0.32. She was transferred to [facility] for cardiac cath.   Hospital Course: Cardiac cath revealed single vessel CAD involving the LCx which was successfully treated with PTCA/DES and preserved LV function, EF 60-65%. She tolerated the procedure well without complications. Recommendations were made for DAPT w/ ASA & Effient for at least 12 months and continued medical therapy with BB and statin. She will need follow up lipid panel and LFTs in 6 weeks as she was initiated on a statin. She was educated on the importance of medication compliance, tobacco cessation, improved diet and exercise. Post cath labs remained stable. Cath site was stable. She was able to ambulate without chest pain.   She was seen and evaluated by Dr. Excell Seltzer who felt she was stable for discharge home with plans for follow up as scheduled below.  Discharge Vitals: Blood pressure 120/87, pulse 71, temperature 98.3 F (36.8 C), temperature source Oral, resp. rate 22, height 5\' 6"  (1.676 m), weight 239 lb 6.7 oz (108.6 kg), SpO2 99.00%.  Labs:  [facility] 11/14/12 Troponin 0.32, 0.31, normal CPK/MB x2 INR 0.9, D-dimer 0.51 LFTs normal LDL 152, HDL 37   11/16/12 0630   NA  137   K  4.1   CL  104   CO2  25   GLUCOSE  90   BUN  10   CREATININE  0.96   CALCIUM  8.8     11/16/12 0630   WBC  8.9   HGB  12.9   HCT  39.8   MCV  89.0   PLT  156     Discharge Medications     Medication List    TAKE these medications       ALPRAZolam 1 MG tablet  Commonly known as:  XANAX  Take 0.5 mg by mouth 2 (two) times daily as needed for anxiety.     aspirin 81 MG chewable tablet  Chew 1 tablet (81 mg total) by mouth daily.     atorvastatin 40 MG tablet  Commonly  known as:  LIPITOR  Take 1 tablet (40 mg total) by mouth at bedtime.     ibuprofen 200 MG tablet  Commonly known as:  ADVIL,MOTRIN  Take 600 mg by mouth 2 (two) times daily as needed for pain.     metoprolol tartrate 25 MG tablet  Commonly known as:  LOPRESSOR  Take 1 tablet (25 mg total) by mouth 2 (two) times daily.     nitroGLYCERIN 0.4 MG SL tablet  Commonly known as:  NITROSTAT  Place 1 tablet (0.4 mg total) under the tongue every 5 (five) minutes as needed for chest pain (up to 3 doses).     prasugrel 10 MG Tabs  Commonly known as:  EFFIENT  Take 1 tablet (10 mg total) by mouth daily.     sertraline 100 MG tablet  Commonly known as:  ZOLOFT  Take 100 mg by mouth daily.     TYLENOL PO  Take 2 tablets by mouth every 8 (eight) hours as needed.        Disposition   Discharge Orders   Future Appointments Provider Department Dept Phone   12/02/2012 1:00 PM Prescott Parma, PA [facility] (near [place]) [phone]   Future Orders Complete By Expires     Diet - low sodium heart healthy  As directed     Discharge instructions  As directed     Comments:      * Please take all medications as prescribed and bring them with you to your office visit  * KEEP GROIN SITE CLEAN AND DRY. Call the office for any signs of bleedings, pus, swelling, increased pain, or any other concerns. * NO HEAVY LIFTING (>10lbs) X 2 WEEKS. * NO SEXUAL ACTIVITY X 2 WEEKS. * NO DRIVING X 1 WEEK. * NO SOAKING BATHS, HOT TUBS, POOLS, ETC., X 7 DAYS.  * You were started on a cholesterol medication (Lipitor) this hospitalization and will need follow up blood work (lipid panel and liver function tests) in 6wks  * Please stop smoking!    Increase activity slowly  As directed       Follow-up Information   Follow up with Lenora Boys, MD. (As needed)    Contact information:   [address] [place] [address] [phone]       Follow up with Prescott Parma, PA On 12/02/2012. (1:00)    Contact  information:    [facility] [address] [place] [address] [phone]        Outstanding Labs/Studies:  Follow up Lipid panel and LFTs in 6 weeks  Duration of Discharge Encounter: Greater than 30 minutes including physician and PA time.  Signed, HOPE, JESSICA PA-C 11/16/2012, 11:14 AM

## 2012-11-16 NOTE — Progress Notes (Signed)
CARDIAC REHAB PHASE I   PRE:  Rate/Rhythm: 84 SR    BP: sitting 120/96 forearm    SaO2:   MODE:  Ambulation: 700 ft   POST:  Rate/Rhythm: 90 SR    BP: sitting 130/106 forearm, 134/84 upper arm     SaO2:   Tolerated well, some SOB after walk. BP elevated. Long and indepth discussion of risk factor modification with boyfriend and mother present. Pt feels overwhelmed. Encouraged her to focus on meds and smoking cessation and ease into other changes. Also discussed stress reduction and thinking positive about change. Gave resources and pt interested in [facility], will send referral to [place].  [phone]  Harriet Masson CES, ACSM

## 2012-11-16 NOTE — Progress Notes (Signed)
Cardiology Progress Note Patient Name: Shannon [name] Date of Encounter: 11/16/2012, 7:35 AM     Subjective  No chest pain or sob. Discussed importance of medication compliance, tobacco cessation, improved diet, and exercise.    Objective   Telemetry: Sinus rhythm 70s, no arrhythmias   Medications: . aspirin EC  81 mg Oral Daily  . atorvastatin  80 mg Oral q1800  . metoprolol tartrate  25 mg Oral BID  . oxyCODONE-acetaminophen  2 tablet Oral Once  . pantoprazole  40 mg Oral Q1200  . prasugrel  10 mg Oral Daily  . sertraline  50 mg Oral Daily  . sodium chloride  3 mL Intravenous Q12H      Physical Exam: Temp:  [97.4 F (36.3 C)-98.6 F (37 C)] 98.5 F (36.9 C) (02/18 0611) Pulse Rate:  [60-80] 70 (02/18 0611) Resp:  [20] 20 (02/17 1222) BP: (93-149)/(46-95) 115/77 mmHg (02/18 0611) SpO2:  [94 %-100 %] 99 % (02/18 0611) Weight:  [238 lb 8 oz (108.183 kg)-239 lb 6.7 oz (108.6 kg)] 239 lb 6.7 oz (108.6 kg) (02/18 1610)  General: Overweight white female, in no acute distress. Head: Normocephalic, atraumatic, sclera non-icteric, nares are without discharge.  Neck: Supple. Negative for carotid bruits or JVD Lungs: Clear bilaterally to auscultation without wheezes, rales, or rhonchi. Breathing is unlabored. Heart: RRR S1 S2 without murmurs, rubs, or gallops.  Abdomen: Soft, non-tender, non-distended with normoactive bowel sounds. No rebound/guarding. No obvious abdominal masses. Msk:  Strength and tone appear normal for age. Extremities: Right groin without bleeding, hematoma, or bruit. Trace bilat ankle edema. No clubbing or cyanosis. Distal pedal pulses are intact and equal bilaterally. Neuro: Somnolent, oriented X 3. Moves all extremities spontaneously. Psych:  Responds to questions appropriately with a flat affect.   Intake/Output Summary (Last 24 hours) at 11/16/12 0735 Last data filed at 11/16/12 0600  Gross per 24 hour  Intake 885.75 ml  Output   1800 ml  Net  -914.25 ml    Labs:   11/16/12 0630  NA 137  K 4.1  CL 104  CO2 25  GLUCOSE 90  BUN 10  CREATININE 0.96  CALCIUM 8.8      11/16/12 0630  WBC 8.9  HGB 12.9  HCT 39.8  MCV 89.0  PLT 156    Radiology/Studies:   11/15/12 - Cardiac Cath LV pressure: 126/16  Aortic pressure: 124/76  Angiography  Left Main: minor luminal irregularities  Left anterior Descending: minor luminal irregularities, 10-20% stenosis. 1st diagonal - moderate sized, minor luminal irregularities  Left Circumflex: moderate in size, there is a tight 90% stenosis in the mid LCx. The lesion is eccentric. The posterior lateral branch and the terminal LCx have mild diffuse disease  Right Coronary Artery: small, mild diffuse disease  LV Gram: overall well preserved LV function. EF 60-65%  Conclusions: Single vessel CAD involving the LCx. Have discussed with Dr. Excell Seltzer. He will proceed with stenting of the LCx this afternoon. Lesion Data:  Vessel: LCx  Percent stenosis (pre): 95  TIMI-flow (pre): 3  Stent: 3.0x32 mm drug-eluting  Percent stenosis (post): 0  TIMI-flow (post): 3  Conclusions: Successful PCI of the LCx with a single long DES  Recommendations: DAPT with ASA and effient for 12 months    Assessment and Plan   1. NSTEMI/CAD: Cath showed 90% stenosis mid LCx and otherwise nonobstructive disease w/ EF 60-65%. Treated with PTCA/DES to LCx. Cont DAPT w/ ASA & Effient for at  least 12 months. Cont BB and statin. Crt stable. Groin site ok. Will need to ambulate with cardiac rehab and reassess groin. Then likely DC after evaluation by MD.   Hypertension: BP stable.   Hyperlipidemia: Cont statin. F/u lipids/LFTs in 6 weeks   Tobacco Abuse: Encouraged to quit smoking  Signed, HOPE, JESSICA PA-C  Patient seen, examined. Available data reviewed. Agree with findings, assessment, and plan as outlined by [facility], PA-C. The patient is alert and oriented in NAD. The right groin site is stable. CV is  RRR without murmur or gallop and there is no leg edema. Lab and EKG data reviewed. The patient is stable for discharge this morning. She has tolerated cardiac rehab well. We have discussed the improtance of med adherence, tobacco cessation, and Phase 2 cardiac rehab. She will follow-up in [facility] for her post-hospital visit but may prefer to see me back for long-term follow-up.  Tonny Bollman, M.D. 11/16/2012 11:14 AM

## 2012-11-16 NOTE — Progress Notes (Signed)
Benefits check for EFFFIENT 10 MG.

## 2012-11-17 ENCOUNTER — Telehealth: Payer: Self-pay | Admitting: *Deleted

## 2012-11-17 NOTE — Telephone Encounter (Signed)
TOC management pc to pt. LMTCB 11/17/12. 

## 2012-11-18 NOTE — Telephone Encounter (Signed)
LMOVM at home reminding pt of appt on 12/02/12 with [name] PA. Also called mobile number and found that this was no longer current.  I have removed this number. Mylo Red RN

## 2012-11-18 NOTE — Telephone Encounter (Signed)
Spoke with pt.  States she is doing well since d/c from hospital.  Has all meds and is aware of f/u appt.

## 2012-11-19 ENCOUNTER — Telehealth: Payer: Self-pay | Admitting: Cardiovascular Disease

## 2012-11-19 NOTE — Telephone Encounter (Signed)
Pt had stent placed by dr Excell Seltzer, needs letter stating pt can rtn to work Monday 11-22-12 , works from home, but has to have a letter before she can get into her computer, [employer]ie [employer] [phone] , if can't go back pls call pt  [phone]

## 2012-11-19 NOTE — Telephone Encounter (Signed)
Pt was a NSTEMI who was just discharged from the hospital on 11/16/12. I left a message on the pt's voicemail that she cannot return to work at this time. The pt's primary Cardiologist is Dr Shirlee Latch and Dr Excell Seltzer did the pt's intervention.  I will forward this message to Dr Shirlee Latch to review and determine when the pt can return to work.

## 2012-11-20 NOTE — Telephone Encounter (Signed)
Looks like pt will be followed in the [employer]. She has an appt with [name],PA in [facility] 12/02/12.

## 2012-11-22 ENCOUNTER — Encounter: Payer: Self-pay | Admitting: *Deleted

## 2012-11-22 ENCOUNTER — Telehealth: Payer: Self-pay | Admitting: Cardiology

## 2012-11-22 NOTE — Telephone Encounter (Signed)
Follow Up   Pt stated she needs a release to work from home per Dr. Excell Seltzer. Requesting release be faxed to her manager so she can clock in to work tomorrow. Fax number: [phone] Attn:Angie Sharol Harness. Pt would like a call back.

## 2012-11-22 NOTE — Telephone Encounter (Signed)
Reviewed with Dr Shirlee Latch. OK for pt to return to work 11/23/12 as long as she works from home at [employer] job.

## 2012-11-22 NOTE — Telephone Encounter (Signed)
Spoke with pt. 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 She is asking if she can return to work tomorrow or Wednesday. I will review with Dr Shirlee Latch.

## 2012-11-22 NOTE — Telephone Encounter (Signed)
Return To Work Note Faxed to Angie Simmons/[phone] Almira Coaster F Ok'd For me to do This) 11/22/12/KM

## 2012-11-22 NOTE — Telephone Encounter (Signed)
See letter done by Dr Shirlee Latch today. Pt aware letter has been done and that I am forwarding this information to HIM.

## 2012-11-22 NOTE — Telephone Encounter (Signed)
Pt calling back to find out when she can go back to work,  she works from home, but work on a computer, will need letter stating when she can start back, pls call asap, has to let them know by 12p

## 2012-11-22 NOTE — Telephone Encounter (Signed)
Per Dr Shirlee Latch. Pt should remain out of work for 2 weeks.

## 2012-11-22 NOTE — Telephone Encounter (Signed)
Return to Work Note Faxed to [employer] @ [phone]

## 2012-11-26 ENCOUNTER — Telehealth: Payer: Self-pay | Admitting: Cardiovascular Disease

## 2012-11-26 NOTE — Telephone Encounter (Signed)
This is a Dr Shirlee Latch patient who should be followed in the [place] office.

## 2012-11-26 NOTE — Telephone Encounter (Signed)
New Problem:     Patient called in because she had a strange episode last night and is extremely fatigued today.  Please call back.

## 2012-11-26 NOTE — Telephone Encounter (Signed)
Stated that she went back to work before she was supposed to.  Felt really fatigued today.  Also questioning about her groin site.  Very anxious since this happened and thinks she is noticing every little thing.  Advised her to rest and relax this weekend.  Also, discussed cath site.  States she has no pain, fever, redness in that area.  Also discussed any adverse symptoms to look for like any increased SOB or chest pain and reminded of protocol for usage of her Nitroglycerin.  Also discussed medication that she is taking for antiplatelet therapy as she had concerns about stent getting blocked.  Informed patient if she did have any symptoms (chest pain / SOB) to go to ED for evaluation in light of recent stent placement.   Patient verbalized understanding.

## 2012-11-26 NOTE — Telephone Encounter (Signed)
Left message to return call 

## 2012-11-26 NOTE — Telephone Encounter (Signed)
She has an appt 12/02/12 in the [place] office  with [name]. I am going to forward to the [facility].

## 2012-12-02 ENCOUNTER — Ambulatory Visit (INDEPENDENT_AMBULATORY_CARE_PROVIDER_SITE_OTHER): Payer: BC Managed Care – PPO | Admitting: Physician Assistant

## 2012-12-02 ENCOUNTER — Encounter: Payer: Self-pay | Admitting: Physician Assistant

## 2012-12-02 VITALS — BP 119/91 | HR 70 | Ht 66.0 in | Wt 237.0 lb

## 2012-12-02 DIAGNOSIS — F17200 Nicotine dependence, unspecified, uncomplicated: Secondary | ICD-10-CM

## 2012-12-02 DIAGNOSIS — I10 Essential (primary) hypertension: Secondary | ICD-10-CM | POA: Insufficient documentation

## 2012-12-02 DIAGNOSIS — E785 Hyperlipidemia, unspecified: Secondary | ICD-10-CM

## 2012-12-02 DIAGNOSIS — E782 Mixed hyperlipidemia: Secondary | ICD-10-CM | POA: Insufficient documentation

## 2012-12-02 DIAGNOSIS — I2510 Atherosclerotic heart disease of native coronary artery without angina pectoris: Secondary | ICD-10-CM

## 2012-12-02 MED ORDER — ATORVASTATIN CALCIUM 40 MG PO TABS: 40.0000 mg | ORAL_TABLET | Freq: Every day | ORAL | Status: DC

## 2012-12-02 MED ORDER — ATORVASTATIN CALCIUM 80 MG PO TABS: 80.0000 mg | ORAL_TABLET | Freq: Every day | ORAL | Status: DC

## 2012-12-02 NOTE — Assessment & Plan Note (Signed)
Well-controlled on current regimen. ?

## 2012-12-02 NOTE — Assessment & Plan Note (Signed)
Lipitor to be increased to full dose, with reassessment of lipid status in 8 weeks. Aggressive management recommended with target LDL 70 or less, if feasible.

## 2012-12-02 NOTE — Patient Instructions (Addendum)
Continue current dose of Lipitor 40mg  every evening Continue all other current medications. Follow up in  3 months

## 2012-12-02 NOTE — Progress Notes (Signed)
Primary Cardiologist: Marca Ancona, MD (new)   HPI: Post hospital followup from [facility], status post initial presentation at [facility] with NSTEMI. She presented with no known history of CAD, but multiple CRFs. Abnormal EKG with subtle horizontal ST segment depression in leads V3, V4. Abnormal troponins of 0.3, with normal MBs.   - Cardiac catheterization, February 17: 1v CAD with 90% mid CFX; EF 60-65%.   Status post successful PCI with DES. Discharged on low-dose ASA and Effient. LDL 152.  She presents today reporting no recurrent nocturnal angina, which was her presenting symptom. As before, she denies any development of exertional CP. She reports improved exercise tolerance, and has returned to work. She has also cut back on tobacco smoking, now at less than 1 ppd.  No Known Allergies  Current Outpatient Prescriptions  Medication Sig Dispense Refill  . ALPRAZolam (XANAX) 1 MG tablet Take 0.5 mg by mouth 2 (two) times daily as needed for anxiety.       Marland Kitchen amoxicillin-clavulanate (AUGMENTIN) 875-125 MG per tablet Take 1 tablet by mouth 2 (two) times daily.       Marland Kitchen aspirin 81 MG chewable tablet Chew 1 tablet (81 mg total) by mouth daily.      Marland Kitchen ibuprofen (ADVIL,MOTRIN) 200 MG tablet Take 600 mg by mouth 2 (two) times daily as needed for pain.      . metoprolol tartrate (LOPRESSOR) 25 MG tablet Take 1 tablet (25 mg total) by mouth 2 (two) times daily.  60 tablet  6  . nitroGLYCERIN (NITROSTAT) 0.4 MG SL tablet Place 1 tablet (0.4 mg total) under the tongue every 5 (five) minutes as needed for chest pain (up to 3 doses).  25 tablet  3  . prasugrel (EFFIENT) 10 MG TABS Take 1 tablet (10 mg total) by mouth daily.  30 tablet  6  . sertraline (ZOLOFT) 100 MG tablet Take 50 mg by mouth daily.       Marland Kitchen atorvastatin (LIPITOR) 80 MG tablet Take 1 tablet (80 mg total) by mouth at bedtime.  30 tablet  6   No current facility-administered medications for this visit.    Past Medical History  Diagnosis Date  .  Anxiety   . Depression   . Hypertension   . Hyperlipidemia   . Coronary artery disease     LHC 11/25/12 90% stenosis mid LCx & otherwise nonobstructive dz w/ EF 60-65% S/p PTCA/DES to LCx  . Tobacco abuse     Past Surgical History  Procedure Laterality Date  . Tonsillectomy    . Uterus ablation      History   Social History  . Marital Status: Legally Separated    Spouse Name: N/A    Number of Children: N/A  . Years of Education: N/A   Occupational History  . Not on file.   Social History Main Topics  . Smoking status: Current Every Day Smoker -- 1.25 packs/day    Types: Cigarettes  . Smokeless tobacco: Not on file  . Alcohol Use: No  . Drug Use: No  . Sexually Active: Yes -- Female partner(s)   Other Topics Concern  . Not on file   Social History Narrative  . No narrative on file    No family history on file.  ROS: no nausea, vomiting; no fever, chills; no melena, hematochezia; no claudication  PHYSICAL EXAM: BP 119/91  Pulse 70  Ht 5\' 6"  (1.676 m)  Wt 237 lb (107.502 kg)  BMI 38.27 kg/m2  SpO2 99%  GENERAL: 47 year old female, obese; NAD HEENT: NCAT, PERRLA, EOMI; sclera clear; no xanthelasma NECK: palpable bilateral carotid pulses, no bruits; no JVD; no TM LUNGS: CTA bilaterally CARDIAC: RRR (S1, S2); no significant murmurs; no rubs or gallops ABDOMEN: soft, protuberant EXTREMETIES: intact R femoral pulse, no hematoma or bruit; trace peripheral edema SKIN: warm/dry; no obvious rash/lesions MUSCULOSKELETAL: no joint deformity NEURO: no focal deficit; NL affect   EKG:    ASSESSMENT & PLAN:  Coronary artery disease Continue current medication regimen, including DAPT with low-dose ASA and Effient for at least 1 year.  Hyperlipidemia Reassessment of lipid status in 8 weeks. Aggressive management recommended with target LDL 70 or less, if feasible.  Hypertension Well-controlled on current regimen  Tobacco use disorder Patient was encouraged to  continue cutting back on tobacco smoking, with recommendation for complete cessation. We agree that Chantix would be a poor choice for her, given her history of depression. I would consider placing her on nicotine patch in near future, if she remains stable from a cardiac standpoint.    [name], PAC

## 2012-12-02 NOTE — Assessment & Plan Note (Signed)
Patient was encouraged to continue cutting back on tobacco smoking, with recommendation for complete cessation. We agree that Chantix would be a poor choice for her, given her history of depression. I would consider placing her on nicotine patch in near future, if she remains stable from a cardiac standpoint.

## 2012-12-02 NOTE — Assessment & Plan Note (Signed)
Continue current medication regimen, including DAPT with low-dose ASA and Effient for at least 1 year.

## 2012-12-30 ENCOUNTER — Telehealth: Payer: Self-pay | Admitting: *Deleted

## 2012-12-30 NOTE — Telephone Encounter (Signed)
Patient called office with multiple issues / questions.  Stated that the Lopressor was making her feel very tired.  Her PMD (Dr. Foy Guadalajara) changed her to Bystolic.  Stated that she really did not feel much difference with this med either.  She has now stopped both of these medications on her own x 3 days.  States that she was so fatigued and tired that she would just sleep all day.  Stated that her PMD also took her out of work for the next couple of weeks.  States that she also has issues with major depression and anxiety which she knows may contribute to all of these symptoms.  She also had multiple questions about her stent (DES), medications, and cardiac rehab.  She also c/o still having some numbness in that right groin area where cath site was.   Scheduled OV with [name], PA for Wednesday, 4/9 to discuss these issues as they were too numerous to discuss over there phone.  Advised her to go to ED if symptoms worsen, she verbalized understanding.

## 2013-01-05 ENCOUNTER — Ambulatory Visit (INDEPENDENT_AMBULATORY_CARE_PROVIDER_SITE_OTHER): Payer: BC Managed Care – PPO | Admitting: Physician Assistant

## 2013-01-05 ENCOUNTER — Encounter: Payer: Self-pay | Admitting: Physician Assistant

## 2013-01-05 VITALS — BP 108/77 | HR 89 | Ht 66.0 in | Wt 236.0 lb

## 2013-01-05 DIAGNOSIS — I714 Abdominal aortic aneurysm, without rupture: Secondary | ICD-10-CM | POA: Insufficient documentation

## 2013-01-05 DIAGNOSIS — R5381 Other malaise: Secondary | ICD-10-CM

## 2013-01-05 DIAGNOSIS — R0602 Shortness of breath: Secondary | ICD-10-CM

## 2013-01-05 DIAGNOSIS — R5383 Other fatigue: Secondary | ICD-10-CM | POA: Insufficient documentation

## 2013-01-05 DIAGNOSIS — R002 Palpitations: Secondary | ICD-10-CM

## 2013-01-05 DIAGNOSIS — F17200 Nicotine dependence, unspecified, uncomplicated: Secondary | ICD-10-CM

## 2013-01-05 DIAGNOSIS — I2510 Atherosclerotic heart disease of native coronary artery without angina pectoris: Secondary | ICD-10-CM

## 2013-01-05 MED ORDER — NICOTINE 21 MG/24HR TD PT24: 1.0000 | MEDICATED_PATCH | TRANSDERMAL | Status: DC

## 2013-01-05 NOTE — Assessment & Plan Note (Signed)
No recurrent CP. Continue DAPT for at least one year. Patient unable to tolerate beta blocker, in context of relative hypotension.

## 2013-01-05 NOTE — Progress Notes (Signed)
Primary Cardiologist: Marca Ancona, MD   HPI: Patient returns as an add-on for evaluation of extreme fatigue, after having just been seen here in clinic on March 6 for post hospital followup, status post NSTEMI/DES CFX on November 17.  Although she continues to report no recurrent CP since her recent hospitalization, she complains of extreme fatigue and DOE. She was recently taken off Lopressor and started on systolic, by her primary M.D., but she decided to stop this 3-4 days ago, noting no palpable benefit. She also suggests that her blood pressure is lower than it used to be, typically in the 130 range systolic. Over these last 2 visits she will P has been less than 120. She does not check it at home, however.  Patient also notes some new onset, occasional "fluttering" that lasts perhaps 5 minutes in duration, with some associated lightheadedness. These can occur several times a week. She has never worn an event monitor. She also complains of exertional dyspnea. Unfortunately, she continues to smoke.  No Known Allergies  Current Outpatient Prescriptions  Medication Sig Dispense Refill  . ALPRAZolam (XANAX) 1 MG tablet Take 0.5 mg by mouth 2 (two) times daily as needed for anxiety.       Marland Kitchen aspirin 81 MG chewable tablet Chew 1 tablet (81 mg total) by mouth daily.      Marland Kitchen atorvastatin (LIPITOR) 40 MG tablet Take 1 tablet (40 mg total) by mouth at bedtime.      Marland Kitchen ibuprofen (ADVIL,MOTRIN) 200 MG tablet Take 600 mg by mouth 2 (two) times daily as needed for pain.      . nitroGLYCERIN (NITROSTAT) 0.4 MG SL tablet Place 1 tablet (0.4 mg total) under the tongue every 5 (five) minutes as needed for chest pain (up to 3 doses).  25 tablet  3  . prasugrel (EFFIENT) 10 MG TABS Take 1 tablet (10 mg total) by mouth daily.  30 tablet  6  . sertraline (ZOLOFT) 100 MG tablet Take 50 mg by mouth daily.       . nicotine (NICODERM CQ - DOSED IN MG/24 HOURS) 21 mg/24hr patch Place 1 patch onto the skin daily.  28  patch  0   No current facility-administered medications for this visit.    Past Medical History  Diagnosis Date  . Anxiety   . Depression   . Hypertension   . Hyperlipidemia   . Coronary artery disease     LHC 11/25/12 90% stenosis mid LCx & otherwise nonobstructive dz w/ EF 60-65% S/p PTCA/DES to LCx  . Tobacco abuse     Past Surgical History  Procedure Laterality Date  . Tonsillectomy    . Uterus ablation      History   Social History  . Marital Status: Legally Separated    Spouse Name: N/A    Number of Children: N/A  . Years of Education: N/A   Occupational History  . Not on file.   Social History Main Topics  . Smoking status: Current Every Day Smoker -- 1.25 packs/day    Types: Cigarettes  . Smokeless tobacco: Not on file  . Alcohol Use: No  . Drug Use: No  . Sexually Active: Yes -- Female partner(s)   Other Topics Concern  . Not on file   Social History Narrative  . No narrative on file    No family history on file.  ROS: no nausea, vomiting; no fever, chills; no melena, hematochezia; no claudication  PHYSICAL EXAM: BP 108/77  Pulse  89  Ht 5\' 6"  (1.676 m)  Wt 236 lb (107.049 kg)  BMI 38.11 kg/m2  SpO2 97% GENERAL: 47 year old female, obese; NAD  HEENT: NCAT, PERRLA, EOMI; sclera clear; no xanthelasma  NECK: palpable bilateral carotid pulses, no bruits; no JVD; no TM  LUNGS: Diminished breath sounds  CARDIAC: RRR (S1, S2); no significant murmurs; no rubs or gallops  ABDOMEN: soft, protuberant  EXTREMETIES: trace peripheral edema  SKIN: warm/dry; no obvious rash/lesions  MUSCULOSKELETAL: no joint deformity  NEURO: no focal deficit; NL affect    EKG:    ASSESSMENT & PLAN:  Other malaise and fatigue We'll order extensive labs to rule out metabolic cause, which includes BMET, CBC, TSH, and BNP level.  Palpitations We'll order 21 day event monitor to rule out dysrhythmia. Extensive labs include TSH level.  Tobacco use disorder Patient  is now amenable for assistance in smoking cessation. Will prescribe nicotine 21 mg patch kit.  Coronary artery disease No recurrent CP. Continue DAPT for at least one year. Patient unable to tolerate beta blocker, in context of relative hypotension.    Gene Lorence Nagengast, PAC

## 2013-01-05 NOTE — Assessment & Plan Note (Signed)
Patient is now amenable for assistance in smoking cessation. Will prescribe nicotine 21 mg patch kit.

## 2013-01-05 NOTE — Patient Instructions (Signed)
   Cardiac Rehab  Labs for BMET, BNP, CBC, TSH  21 day heart monitor   Office will contact with results  Nicotine patch 21mg /hr Follow up in  1 month

## 2013-01-05 NOTE — Assessment & Plan Note (Signed)
We'll order 21 day event monitor to rule out dysrhythmia. Extensive labs include TSH level.

## 2013-01-05 NOTE — Assessment & Plan Note (Signed)
We'll order extensive labs to rule out metabolic cause, which includes BMET, CBC, TSH, and BNP level.

## 2013-01-06 ENCOUNTER — Other Ambulatory Visit: Payer: Self-pay | Admitting: *Deleted

## 2013-01-06 DIAGNOSIS — R002 Palpitations: Secondary | ICD-10-CM

## 2013-01-11 ENCOUNTER — Telehealth: Payer: Self-pay | Admitting: Cardiovascular Disease

## 2013-01-11 NOTE — Telephone Encounter (Signed)
FMLA received Via Fax From [facility], Spoke with Pt Yesterday 4/14 she asked for me to Give this toi  Lauren/Cooper to See if he Will complete as she stated she doesn't have the $25.00 to pay [employer] To get this completed, Placed On Lauren's Desk This am 01/11/13/KM

## 2013-01-13 ENCOUNTER — Telehealth: Payer: Self-pay | Admitting: Cardiovascular Disease

## 2013-01-13 NOTE — Telephone Encounter (Signed)
Lauren gave Me FMLA papers back w/ Note attached Dr.Cooper Will Not sign these papers because Pt was  Release to go back to Work on 2/25/14by Dr.McLean. I have LMOVM for Pt to Return my phone call. 01/13/13/KM

## 2013-01-17 ENCOUNTER — Other Ambulatory Visit: Payer: Self-pay | Admitting: *Deleted

## 2013-01-17 ENCOUNTER — Other Ambulatory Visit: Payer: Self-pay

## 2013-02-01 ENCOUNTER — Telehealth: Payer: Self-pay | Admitting: Cardiology

## 2013-02-01 NOTE — Telephone Encounter (Signed)
Pt Called In asking If [employer] FMLA has Been completed, I let her Know I left 2 Messages with Her On 01/14/13 & 01/17/13 and did Not Received a Call back Until Today, Let Her Know Dr.Cooper will  NOT Be Completing her FMLA for Her since she was Release to RTW On 11/23/12 By Dr.McLean And she is Currently Seen in [employer]. Pt asked If paperwork can Be Faxed to [employer] Via Fax  02/01/13/KM

## 2013-02-03 ENCOUNTER — Telehealth: Payer: Self-pay | Admitting: Cardiovascular Disease

## 2013-02-03 NOTE — Telephone Encounter (Signed)
[name] Called Me this am stating He Received the PW on this Pt that I Sent to Him, He was asking Why he Needed to Sign This, I let him know pt Was Release to RTW by Dr.McLean, and Dr.Cooper also refused To sign this, he Stated he Will Not sign And we agreed for Him to Return the PW back To Me  02/03/13/KM

## 2013-02-03 NOTE — Telephone Encounter (Signed)
LMOVM for Pt concerning Her paperwork, asked her to call Back 02/03/13/KM

## 2013-02-08 ENCOUNTER — Ambulatory Visit: Payer: BC Managed Care – PPO | Admitting: Cardiovascular Disease

## 2013-02-28 ENCOUNTER — Ambulatory Visit: Payer: BC Managed Care – PPO | Admitting: Cardiology

## 2013-03-04 ENCOUNTER — Telehealth: Payer: Self-pay | Admitting: Physician Assistant

## 2013-03-04 MED ORDER — PRASUGREL HCL 10 MG PO TABS: 10.0000 mg | ORAL_TABLET | Freq: Every day | ORAL | Status: DC

## 2013-03-04 NOTE — Telephone Encounter (Signed)
Received a telephone call today from Mrs. Lords wanting to know where her disability forms are. States that she spoke With Selena Batten in the [place] office and was told she would need to contact [place] office. I reviewed the telephone calls And told her that Selena Batten tried to contact her on 02/03/2013 but she never returned telephone call. She is upset over the fact noone will complete her disability forms.

## 2013-03-09 ENCOUNTER — Telehealth: Payer: Self-pay

## 2013-03-09 NOTE — Telephone Encounter (Signed)
Patient's mother called requesting that a manager contact her in reference to her daughter.   She would like for someone to explain to her why the doctor will not sign documents?

## 2013-03-09 NOTE — Telephone Encounter (Signed)
Received a telephone call from Shannon [name] who states she is the mother to Shannon [name]. Mrs. Shannon [name] states that she does not understand why Dr. Excell Seltzer will not sign her daughter's disability forms. I reviewed Mrs. Burchill documentation and I can not find where we have permission to speak to Mrs. Shannon [name]. I explained that to her and told her to ask Mrs. Vea to come by the office and sign a DPR giving [place] permission to speak to her. She understood and said that she would have daughter come by and sign the form.   Mrs. Shannon [name] called back to the office and stated that her daughter had given [place] permission to talk to her mother. I explained To Mrs. Shannon [name] that we have a certain form that has to be signed and scanned into the system before we can talk to her. She said that she would relay this messge to her daughter.

## 2013-04-08 ENCOUNTER — Other Ambulatory Visit (HOSPITAL_COMMUNITY): Payer: Self-pay | Admitting: Internal Medicine

## 2013-04-08 DIAGNOSIS — Z139 Encounter for screening, unspecified: Secondary | ICD-10-CM

## 2013-04-12 ENCOUNTER — Inpatient Hospital Stay (HOSPITAL_COMMUNITY): Admission: RE | Admit: 2013-04-12 | Payer: BC Managed Care – PPO | Source: Ambulatory Visit

## 2013-04-21 ENCOUNTER — Other Ambulatory Visit (HOSPITAL_COMMUNITY): Payer: Self-pay | Admitting: Internal Medicine

## 2013-04-21 DIAGNOSIS — R109 Unspecified abdominal pain: Secondary | ICD-10-CM

## 2013-04-21 DIAGNOSIS — N920 Excessive and frequent menstruation with regular cycle: Secondary | ICD-10-CM

## 2013-04-22 ENCOUNTER — Telehealth: Payer: Self-pay | Admitting: Cardiology

## 2013-04-22 NOTE — Telephone Encounter (Signed)
Received call concerning denial by physician to complete forms. Patient has a stent placed by Dr. Elease Hashimoto listed as the primary and Dr. Excell Seltzer assisting on November 15, 2012.  In the past both Dr. Excell Seltzer and Prescott Parma have refused. After reviewing previous telephone encounters it appears Dr. Excell Seltzer and Prescott Parma did not return the patient to work. A letter created by Katina Dung, signed by Dr. Shirlee Latch was completed with a Return to Work date November 23, 2012. The patient has an appointment with Dr. Excell Seltzer on 04/27/13.   Patient agreed to receive an authorization and [employer] payment form via the mail to complete and present to the HIM department during her appointment. She will pursue getting the physician statement form from [facility]. Patient is aware it will not be presented to the doc until the authorization is complete and payment is received. After the necessary documents are received we will pursue presenting form to Dr. Shirlee Latch or Dr. Elease Hashimoto. She is also expected to experience 7-10 business days for completion. rmf 04/22/13.

## 2013-04-27 ENCOUNTER — Encounter: Payer: Self-pay | Admitting: Cardiovascular Disease

## 2013-04-27 ENCOUNTER — Ambulatory Visit (INDEPENDENT_AMBULATORY_CARE_PROVIDER_SITE_OTHER): Payer: BC Managed Care – PPO | Admitting: Cardiovascular Disease

## 2013-04-27 ENCOUNTER — Telehealth: Payer: Self-pay | Admitting: Cardiovascular Disease

## 2013-04-27 VITALS — BP 134/98 | HR 87 | Ht 66.0 in | Wt 244.0 lb

## 2013-04-27 DIAGNOSIS — R609 Edema, unspecified: Secondary | ICD-10-CM

## 2013-04-27 DIAGNOSIS — I2510 Atherosclerotic heart disease of native coronary artery without angina pectoris: Secondary | ICD-10-CM

## 2013-04-27 DIAGNOSIS — R0602 Shortness of breath: Secondary | ICD-10-CM

## 2013-04-27 MED ORDER — METOPROLOL TARTRATE 25 MG PO TABS: 25.0000 mg | ORAL_TABLET | Freq: Two times a day (BID) | ORAL | Status: DC

## 2013-04-27 MED ORDER — FUROSEMIDE 20 MG PO TABS: 20.0000 mg | ORAL_TABLET | Freq: Every day | ORAL | Status: DC

## 2013-04-27 NOTE — Progress Notes (Signed)
HPI:  47 year old woman presenting for followup evaluation. The patient has been followed for coronary artery disease, hypertension, and hyperlipidemia. She presented with a non-STEMI in February 2014. She was found to have critical stenosis of the left circumflex and was treated with a drug-eluting stent. Her left ventricular function was preserved.  On initial followup she complains of marked fatigue with a beta blocker. Her dose was initially reduced and then was discontinued. However, she notes that she felt better now in retrospect when she was on a beta blocker. She denies further chest pain. She does have dyspnea with exertion and complains of worsening leg swelling. She denies orthopnea or PND. She continues to smoke cigarettes. She feels like she is gaining weight but denies any changes in her lifestyle. She doesn't participate in regular exercise. She was diagnosed with a small kidney stone recently. No bleeding problems on effient. No other complaints today.  Outpatient Encounter Prescriptions as of 04/27/2013  Medication Sig Dispense Refill  . ALPRAZolam (XANAX) 1 MG tablet Take 0.5 mg by mouth 2 (two) times daily as needed for anxiety.       Marland Kitchen aspirin 81 MG tablet Take 81 mg by mouth daily.      Marland Kitchen atorvastatin (LIPITOR) 40 MG tablet Take 1 tablet (40 mg total) by mouth at bedtime.      Marland Kitchen HYDROcodone-acetaminophen (NORCO/VICODIN) 5-325 MG per tablet Take 1 tablet by mouth every 6 (six) hours as needed for pain.      Marland Kitchen ibuprofen (ADVIL,MOTRIN) 200 MG tablet Take 600 mg by mouth 2 (two) times daily as needed for pain.      . nitroGLYCERIN (NITROSTAT) 0.4 MG SL tablet Place 1 tablet (0.4 mg total) under the tongue every 5 (five) minutes as needed for chest pain (up to 3 doses).  25 tablet  3  . prasugrel (EFFIENT) 10 MG TABS Take 1 tablet (10 mg total) by mouth daily.  90 tablet  3  . Vilazodone HCl (VIIBRYD) 10 MG TABS Take by mouth daily.      . [DISCONTINUED] aspirin 81 MG chewable  tablet Chew 1 tablet (81 mg total) by mouth daily.      . [DISCONTINUED] metoprolol tartrate (LOPRESSOR) 25 MG tablet       . [DISCONTINUED] nicotine (NICODERM CQ - DOSED IN MG/24 HOURS) 21 mg/24hr patch Place 1 patch onto the skin daily.  28 patch  0  . [DISCONTINUED] sertraline (ZOLOFT) 100 MG tablet Take 50 mg by mouth daily.        No facility-administered encounter medications on file as of 04/27/2013.    No Known Allergies  Past Medical History  Diagnosis Date  . Anxiety   . Depression   . Hypertension   . Hyperlipidemia   . Coronary artery disease     LHC 11/25/12 90% stenosis mid LCx & otherwise nonobstructive dz w/ EF 60-65% S/p PTCA/DES to LCx  . Tobacco abuse     ROS: Negative except as per HPI  BP 134/98  Pulse 87  Ht 5\' 6"  (1.676 m)  Wt 110.678 kg (244 lb)  BMI 39.4 kg/m2  PHYSICAL EXAM: Pt is alert and oriented, pleasant obese woman in NAD HEENT: normal Neck: JVP - normal, carotids 2+= without bruits Lungs: CTA bilaterally CV: RRR without murmur or gallop Abd: soft, NT, Positive BS, no hepatomegaly Ext: 1+ pretibial edema bilaterally, distal pulses intact and equal Skin: warm/dry no rash  ASSESSMENT AND PLAN: 1. Coronary artery disease, native vessel. At the  time of her non-STEMI, she experienced chest pain up into the neck and jaw. No recurrence of symptoms. She will continue on dual antiplatelet therapy with aspirin and effient as well as lipid-lowering with atorvastatin. I'm going to start her back on metoprolol 25 mg twice daily. Tobacco cessation reviewed.  2. Hyperlipidemia. Patient is on atorvastatin 40 mg.  3. Hypertension. Patient's blood pressure is elevated. She will be started on metoprolol 25 mg twice daily.  4. Edema. Suspect much of this is related to her obesity. She was counseled extensively about the need for weight loss. She does have shortness of breath and we will check an echo to make sure that congestive heart failure is not playing a  role in her symptoms. Will start on furosemide 20 mg daily. She should have followup labs in 2 weeks.  For clinical followup, I will schedule her to return in 6 months.  Tonny Bollman 04/27/2013 10:37 AM

## 2013-04-27 NOTE — Patient Instructions (Addendum)
Your physician has recommended you make the following change in your medication: START Metoprolol Tartrate 25mg  take one by mouth twice a day, START Furosemide 20mg  take one by mouth daily  Your physician has requested that you have an echocardiogram. Echocardiography is a painless test that uses sound waves to create images of your heart. It provides your doctor with information about the size and shape of your heart and how well your heart's chambers and valves are working. This procedure takes approximately one hour. There are no restrictions for this procedure.  Your physician recommends that you return for lab work in: 2 WEEKS (BMP)--order given to the pt  Your physician wants you to follow-up in: 6 MONTHS with Dr Excell Seltzer.  You will receive a reminder letter in the mail two months in advance. If you don't receive a letter, please call our office to schedule the follow-up appointment.

## 2013-04-27 NOTE — Telephone Encounter (Signed)
ROI faxed to [facility] at [phone] 04/27/13/km

## 2013-04-28 ENCOUNTER — Encounter: Payer: Self-pay | Admitting: Cardiovascular Disease

## 2013-05-04 ENCOUNTER — Telehealth: Payer: Self-pay | Admitting: Cardiovascular Disease

## 2013-05-04 NOTE — Telephone Encounter (Signed)
Left message on pt's voicemail that there are no restrictions prior to Echo.

## 2013-05-04 NOTE — Telephone Encounter (Signed)
Patient would like to know if there is anything special she needs to do for Echo tomorrow

## 2013-05-04 NOTE — Telephone Encounter (Signed)
Records Rec From [facility], gave to lauren 05/04/13/KM

## 2013-05-05 ENCOUNTER — Other Ambulatory Visit (INDEPENDENT_AMBULATORY_CARE_PROVIDER_SITE_OTHER): Payer: BC Managed Care – PPO

## 2013-05-05 DIAGNOSIS — R609 Edema, unspecified: Secondary | ICD-10-CM

## 2013-05-05 DIAGNOSIS — R0602 Shortness of breath: Secondary | ICD-10-CM

## 2013-05-05 DIAGNOSIS — I2510 Atherosclerotic heart disease of native coronary artery without angina pectoris: Secondary | ICD-10-CM

## 2013-06-02 ENCOUNTER — Telehealth: Payer: Self-pay | Admitting: Cardiovascular Disease

## 2013-06-02 NOTE — Telephone Encounter (Signed)
Follow Up ° °Pt returning call from earlier. Please call. °

## 2013-06-02 NOTE — Telephone Encounter (Signed)
Left message on machine for pt to contact the office.   

## 2013-06-03 NOTE — Telephone Encounter (Signed)
Left message on machine for pt to contact the office.   

## 2013-06-03 NOTE — Telephone Encounter (Signed)
Forms completed and given to medical records to fax.  I asked the pt if she has had BMP drawn and she said that she had a death in the family and has not been able to get labs done.  The pt said she would try to have BMP drawn next week.

## 2013-08-10 ENCOUNTER — Emergency Department (HOSPITAL_COMMUNITY): Payer: BC Managed Care – PPO

## 2013-08-10 ENCOUNTER — Emergency Department (HOSPITAL_COMMUNITY)
Admission: EM | Admit: 2013-08-10 | Discharge: 2013-08-11 | Disposition: A | Discharge status: Home / Self Care | Payer: BC Managed Care – PPO | Attending: Emergency Medicine | Admitting: Emergency Medicine

## 2013-08-10 ENCOUNTER — Encounter (HOSPITAL_COMMUNITY): Payer: Self-pay | Admitting: Emergency Medicine

## 2013-08-10 DIAGNOSIS — R0602 Shortness of breath: Secondary | ICD-10-CM

## 2013-08-10 DIAGNOSIS — Z9861 Coronary angioplasty status: Secondary | ICD-10-CM | POA: Insufficient documentation

## 2013-08-10 DIAGNOSIS — J209 Acute bronchitis, unspecified: Secondary | ICD-10-CM | POA: Insufficient documentation

## 2013-08-10 DIAGNOSIS — I2510 Atherosclerotic heart disease of native coronary artery without angina pectoris: Secondary | ICD-10-CM | POA: Insufficient documentation

## 2013-08-10 DIAGNOSIS — Z79899 Other long term (current) drug therapy: Secondary | ICD-10-CM | POA: Insufficient documentation

## 2013-08-10 DIAGNOSIS — Z862 Personal history of diseases of the blood and blood-forming organs and certain disorders involving the immune mechanism: Secondary | ICD-10-CM | POA: Insufficient documentation

## 2013-08-10 DIAGNOSIS — I252 Old myocardial infarction: Secondary | ICD-10-CM | POA: Insufficient documentation

## 2013-08-10 DIAGNOSIS — F17200 Nicotine dependence, unspecified, uncomplicated: Secondary | ICD-10-CM | POA: Insufficient documentation

## 2013-08-10 DIAGNOSIS — J40 Bronchitis, not specified as acute or chronic: Secondary | ICD-10-CM

## 2013-08-10 DIAGNOSIS — R5381 Other malaise: Secondary | ICD-10-CM | POA: Insufficient documentation

## 2013-08-10 DIAGNOSIS — Z8639 Personal history of other endocrine, nutritional and metabolic disease: Secondary | ICD-10-CM | POA: Insufficient documentation

## 2013-08-10 DIAGNOSIS — F411 Generalized anxiety disorder: Secondary | ICD-10-CM | POA: Insufficient documentation

## 2013-08-10 DIAGNOSIS — I10 Essential (primary) hypertension: Secondary | ICD-10-CM | POA: Insufficient documentation

## 2013-08-10 DIAGNOSIS — F3289 Other specified depressive episodes: Secondary | ICD-10-CM | POA: Insufficient documentation

## 2013-08-10 DIAGNOSIS — Z7982 Long term (current) use of aspirin: Secondary | ICD-10-CM | POA: Insufficient documentation

## 2013-08-10 DIAGNOSIS — F329 Major depressive disorder, single episode, unspecified: Secondary | ICD-10-CM | POA: Insufficient documentation

## 2013-08-10 DIAGNOSIS — R609 Edema, unspecified: Secondary | ICD-10-CM | POA: Insufficient documentation

## 2013-08-10 HISTORY — DX: Acute myocardial infarction, unspecified: I21.9

## 2013-08-10 LAB — POCT I-STAT TROPONIN I

## 2013-08-10 LAB — CBC
HCT: 41.8 % (ref 36.0–46.0)
Hemoglobin: 13.8 g/dL (ref 12.0–15.0)
MCH: 29.2 pg (ref 26.0–34.0)
MCHC: 33 g/dL (ref 30.0–36.0)
MCV: 88.4 fL (ref 78.0–100.0)
Platelets: 180 K/uL (ref 150–400)
RBC: 4.73 MIL/uL (ref 3.87–5.11)
RDW: 14.3 % (ref 11.5–15.5)
WBC: 9.9 K/uL (ref 4.0–10.5)

## 2013-08-10 LAB — COMPREHENSIVE METABOLIC PANEL WITH GFR
ALT: 10 U/L (ref 0–35)
AST: 11 U/L (ref 0–37)
Albumin: 3.2 g/dL — ABNORMAL LOW (ref 3.5–5.2)
Alkaline Phosphatase: 67 U/L (ref 39–117)
BUN: 10 mg/dL (ref 6–23)
CO2: 22 meq/L (ref 19–32)
Calcium: 9 mg/dL (ref 8.4–10.5)
Chloride: 100 meq/L (ref 96–112)
Creatinine, Ser: 0.89 mg/dL (ref 0.50–1.10)
GFR calc Af Amer: 87 mL/min — ABNORMAL LOW (ref >90)
GFR calc non Af Amer: 75 mL/min — ABNORMAL LOW (ref >90)
Glucose, Bld: 121 mg/dL — ABNORMAL HIGH (ref 70–99)
Potassium: 3.4 meq/L — ABNORMAL LOW (ref 3.5–5.1)
Sodium: 134 meq/L — ABNORMAL LOW (ref 135–145)
Total Bilirubin: 0.2 mg/dL — ABNORMAL LOW (ref 0.3–1.2)
Total Protein: 7.1 g/dL (ref 6.0–8.3)

## 2013-08-10 LAB — PRO B NATRIURETIC PEPTIDE: Pro B Natriuretic peptide (BNP): 177.3 pg/mL — ABNORMAL HIGH (ref 0–125)

## 2013-08-10 IMAGING — CR DG CHEST 2V
2 series · 2 of 2 positions shown · non-contrast
Comparison: Chest radiograph performed [DATE]

CLINICAL DATA: Shortness of breath; recent cold symptoms. History
of smoking.

EXAM:
CHEST  2 VIEW

[view not recorded (1 of 2)]
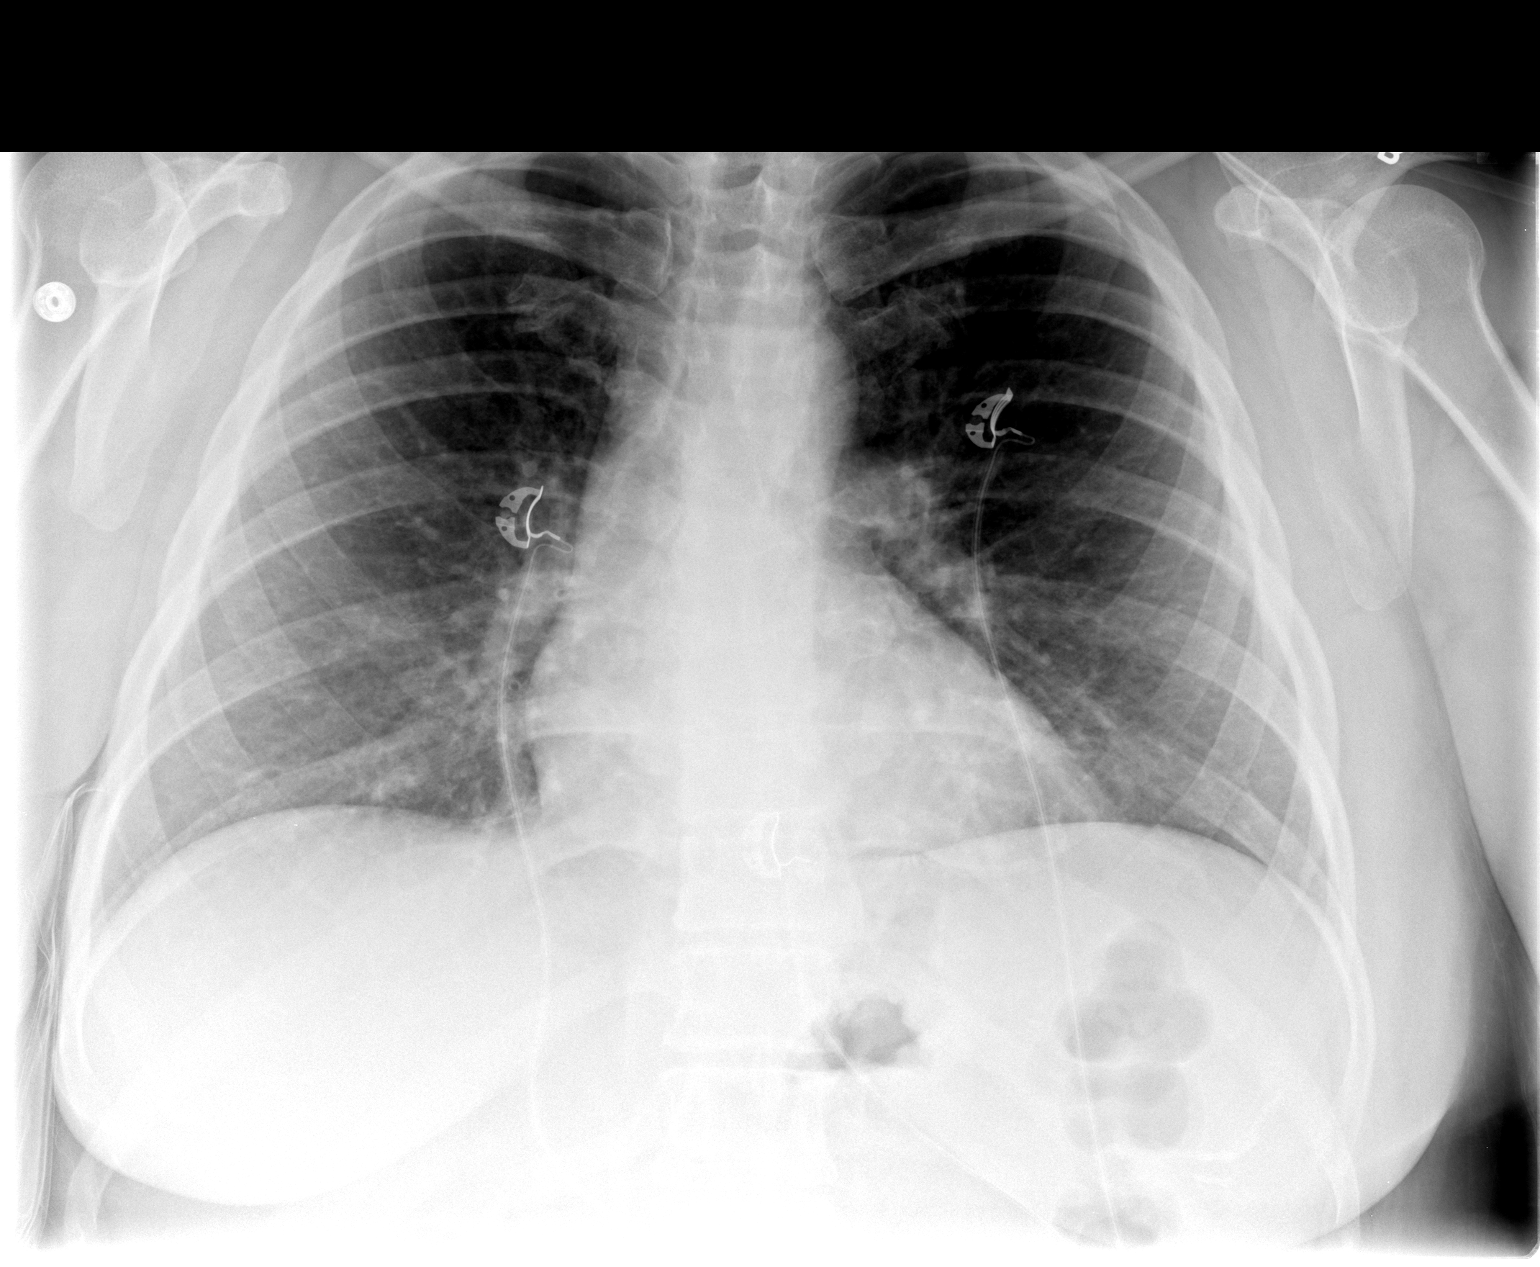

[view not recorded (2 of 2)]
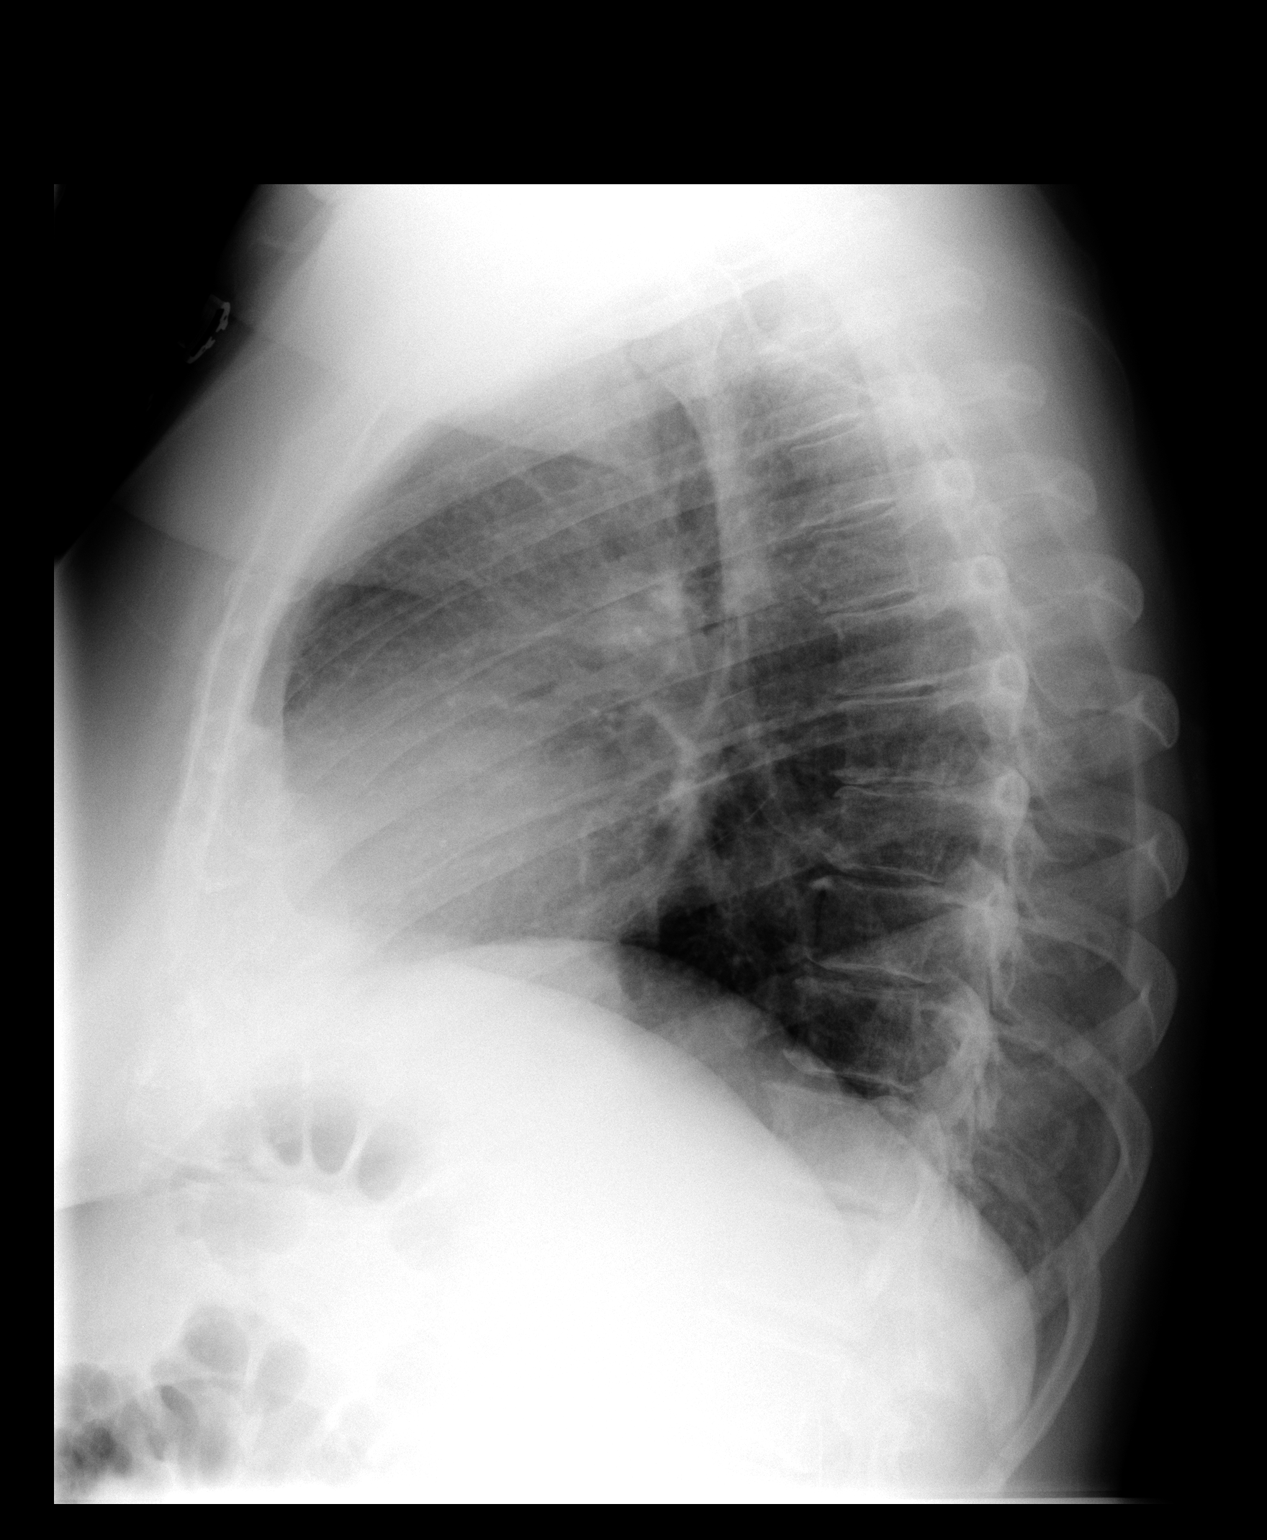

[2 of 2 positions shown; findings below may reference images not displayed]

FINDINGS: The lungs are well-aerated and clear. There is no evidence of focal
opacification, pleural effusion or pneumothorax.

The heart is normal in size; the mediastinal contour is within
normal limits. No acute osseous abnormalities are seen.
IMPRESSION: No acute cardiopulmonary process seen.

## 2013-08-10 NOTE — ED Provider Notes (Signed)
CSN: [id number]     Arrival date & time 08/10/13  2218 History  This chart was scribed for Vida Roller, MD by Blanchard Kelch, ED Scribe. The patient was seen in room APA12/APA12. Patient's care was started at 12:14 AM.     Chief Complaint  Patient presents with  . Chest Pain    HPI Comments: Pt has had a cold for 1 week - sinus drainage and pressure and nasal congestion - then she developed a hoarse  Voice - starting to get worse after getting better.  Today she feels tightness in her chest and voice is more hoarse.  She has assocaited SOB with the tightness.  She has been missing work so she is feeling more stress as well.  She has a hx of MI s/p stenting and hypertension / hyperlipidemia.  She does smoke cigarettes.    She endorses coughing "quite a bit" but is non productive.  It is a dry cough.  She has some subj fevers / chills.  No vomiting but has some nausea.  No diarrhea.  Feels some myalgias and decreased energy.    Stress seems to make things worse.  She has no exertional sx and no positional sx other than nasal congestion when she breaths laying down flat.  Patient is a 47 y.o. female presenting with chest pain. The history is provided by the patient. No language interpreter was used.  Chest Pain Associated symptoms: cough, fatigue, fever ( subjective), nausea and shortness of breath   Associated symptoms: no back pain, no headache, no numbness, not vomiting and no weakness ( focal)     Past Medical History  Diagnosis Date  . Anxiety   . Depression   . Hypertension   . Hyperlipidemia   . Coronary artery disease     LHC 11/25/12 90% stenosis mid LCx & otherwise nonobstructive dz w/ EF 60-65% S/p PTCA/DES to LCx  . Tobacco abuse   . MI (myocardial infarction)    Past Surgical History  Procedure Laterality Date  . Tonsillectomy    . Uterus ablation    . Coronary angioplasty with stent placement     History reviewed. No pertinent family history. History  Substance Use  Topics  . Smoking status: Current Every Day Smoker -- 1.25 packs/day    Types: Cigarettes  . Smokeless tobacco: Not on file  . Alcohol Use: No   OB History   Grav Para Term Preterm Abortions TAB SAB Ect Mult Living                 Review of Systems  Constitutional: Positive for fever ( subjective) and fatigue.  HENT: Positive for congestion, postnasal drip, sinus pressure and voice change.   Eyes: Negative for pain, discharge and redness.  Respiratory: Positive for cough and shortness of breath.   Cardiovascular: Positive for chest pain.  Gastrointestinal: Positive for nausea. Negative for vomiting.  Genitourinary: Negative for dysuria.  Musculoskeletal: Negative for back pain, neck pain and neck stiffness.  Skin: Negative for rash.  Neurological: Negative for weakness ( focal), numbness and headaches.  All other systems reviewed and are negative.    Allergies  Morphine and related  Home Medications   Current Outpatient Rx  Name  Route  Sig  Dispense  Refill  . ALPRAZolam (XANAX) 1 MG tablet   Oral   Take 0.5 mg by mouth 2 (two) times daily as needed for anxiety.          Marland Kitchen aspirin  81 MG tablet   Oral   Take 81 mg by mouth daily.         Marland Kitchen ibuprofen (ADVIL,MOTRIN) 200 MG tablet   Oral   Take 600 mg by mouth 2 (two) times daily as needed for pain.         . nitroGLYCERIN (NITROSTAT) 0.4 MG SL tablet   Sublingual   Place 1 tablet (0.4 mg total) under the tongue every 5 (five) minutes as needed for chest pain (up to 3 doses).   25 tablet   3   . prasugrel (EFFIENT) 10 MG TABS   Oral   Take 1 tablet (10 mg total) by mouth daily.   90 tablet   3   . amoxicillin-clavulanate (AUGMENTIN) 875-125 MG per tablet   Oral   Take 1 tablet by mouth every 12 (twelve) hours.   14 tablet   0    Triage Vitals: BP 133/97  Pulse 96  Temp(Src) 98.3 F (36.8 C) (Oral)  Resp 20  Ht 5\' 6"  (1.676 m)  Wt 240 lb (108.863 kg)  BMI 38.76 kg/m2  SpO2 100%  LMP  06/10/2013  Physical Exam  Nursing note and vitals reviewed. Constitutional: She appears well-developed and well-nourished. No distress.  HENT:  Head: Normocephalic and atraumatic.  Mouth/Throat: Oropharynx is clear and moist. No oropharyngeal exudate.  OP clear, no redness / exudate / hypertrophy  Eyes: Conjunctivae and EOM are normal. Pupils are equal, round, and reactive to light. Right eye exhibits no discharge. Left eye exhibits no discharge. No scleral icterus.  Neck: Normal range of motion. Neck supple. No JVD present. No thyromegaly present.  Cardiovascular: Normal rate, regular rhythm, normal heart sounds and intact distal pulses.  Exam reveals no gallop and no friction rub.   No murmur heard. Strong pulses at the radial arteries bialterally  Pulmonary/Chest: Effort normal and breath sounds normal. No respiratory distress. She has no wheezes. She has no rales.  No inc WOB, no resp distress, no acc muscle use, clear lungs, no wheezing or rales.  Voice with slight hoarsness  Abdominal: Soft. Bowel sounds are normal. She exhibits no distension and no mass. There is no tenderness.  Musculoskeletal: Normal range of motion. She exhibits edema ( scant bil edema without asymetry). She exhibits no tenderness.  Lymphadenopathy:    She has no cervical adenopathy.  Neurological: She is alert. Coordination normal.  Skin: Skin is warm and dry. No rash noted. No erythema.  Psychiatric: She has a normal mood and affect. Her behavior is normal.    ED Course  Procedures (including critical care time)  DIAGNOSTIC STUDIES: Oxygen Saturation is 100% on room air, normal by my interpretation.    COORDINATION OF CARE: 12:14 AM - Patient verbalizes understanding and agrees with treatment plan.    Labs Review Labs Reviewed  COMPREHENSIVE METABOLIC PANEL - Abnormal; Notable for the following:    Sodium 134 (*)    Potassium 3.4 (*)    Glucose, Bld 121 (*)    Albumin 3.2 (*)    Total Bilirubin  0.2 (*)    GFR calc non Af Amer 75 (*)    GFR calc Af Amer 87 (*)    All other components within normal limits  PRO B NATRIURETIC PEPTIDE - Abnormal; Notable for the following:    Pro B Natriuretic peptide (BNP) 177.3 (*)    All other components within normal limits  CBC  POCT I-STAT TROPONIN I   Imaging Review Dg Chest 2  View  08/10/2013   CLINICAL DATA:  Shortness of breath; recent cold symptoms. History of smoking.  EXAM: CHEST  2 VIEW  COMPARISON:  Chest radiograph performed 09/23/2012  FINDINGS: The lungs are well-aerated and clear. There is no evidence of focal opacification, pleural effusion or pneumothorax.  The heart is normal in size; the mediastinal contour is within normal limits. No acute osseous abnormalities are seen.  IMPRESSION: No acute cardiopulmonary process seen.   Electronically Signed   By: Roanna Raider M.D.   On: 08/10/2013 23:58    EKG Interpretation     Ventricular Rate:  85 PR Interval:  142 QRS Duration: 84 QT Interval:  370 QTC Calculation: 440 R Axis:   39 Text Interpretation:  Normal sinus rhythm Normal ECG When compared with ECG of 16-Nov-2012 06:15, No significant change was found            MDM   1. Shortness of breath   2. Bronchitis    ECG is non ischemic - the pt has had URI sx that have descended into the chest and now has a non productive cough - VS are normal, ECG is non ischemic - sx are worsened with coughing / stress but not exertion.  Check labs and CXR.  No wheezing, hypoxia or tachypnea.  CXR and labs without acute findings - pt appaers stable for d/c - doubt ACS given pt's infectious symptoms - she has some sinusitis sx thus the abx.  She has been counseled on cough with bronchitis and no likelyi improvement with abx - need for MDI questionable given no wheezing but with dry cough, tightness and SOB will give MDI and teaching for home.  Pt appears stable for d/c.   I personally performed the services described in this  documentation, which was scribed in my presence. The recorded information has been reviewed and is accurate.      Vida Roller, MD 08/11/13 [phone]

## 2013-08-10 NOTE — ED Notes (Signed)
Patient complaining of chest pain with shortness of breath starting today at 1900.

## 2013-08-11 MED ORDER — AMOXICILLIN-POT CLAVULANATE 875-125 MG PO TABS: 1.0000 | ORAL_TABLET | Freq: Two times a day (BID) | ORAL | Status: DC

## 2013-08-11 MED ORDER — ALBUTEROL SULFATE HFA 108 (90 BASE) MCG/ACT IN AERS
2.0000 | INHALATION_SPRAY | Freq: Once | RESPIRATORY_TRACT | Status: AC
  Administered 2013-08-11: 2 via RESPIRATORY_TRACT
  Filled 2013-08-11: qty 6.7

## 2013-08-11 NOTE — ED Notes (Signed)
Gave patient something to drink as requested.  

## 2014-02-02 ENCOUNTER — Other Ambulatory Visit (HOSPITAL_COMMUNITY): Payer: BC Managed Care – PPO | Attending: Psychiatry | Admitting: Psychiatry

## 2014-02-02 ENCOUNTER — Encounter (HOSPITAL_COMMUNITY): Payer: Self-pay

## 2014-02-02 DIAGNOSIS — F332 Major depressive disorder, recurrent severe without psychotic features: Secondary | ICD-10-CM | POA: Insufficient documentation

## 2014-02-02 DIAGNOSIS — F411 Generalized anxiety disorder: Secondary | ICD-10-CM | POA: Insufficient documentation

## 2014-02-02 DIAGNOSIS — F331 Major depressive disorder, recurrent, moderate: Secondary | ICD-10-CM

## 2014-02-02 DIAGNOSIS — F17200 Nicotine dependence, unspecified, uncomplicated: Secondary | ICD-10-CM | POA: Insufficient documentation

## 2014-02-02 DIAGNOSIS — E785 Hyperlipidemia, unspecified: Secondary | ICD-10-CM | POA: Insufficient documentation

## 2014-02-02 DIAGNOSIS — Z9861 Coronary angioplasty status: Secondary | ICD-10-CM | POA: Insufficient documentation

## 2014-02-02 DIAGNOSIS — I252 Old myocardial infarction: Secondary | ICD-10-CM | POA: Insufficient documentation

## 2014-02-02 DIAGNOSIS — I2510 Atherosclerotic heart disease of native coronary artery without angina pectoris: Secondary | ICD-10-CM | POA: Insufficient documentation

## 2014-02-02 DIAGNOSIS — I10 Essential (primary) hypertension: Secondary | ICD-10-CM | POA: Insufficient documentation

## 2014-02-02 DIAGNOSIS — Z7982 Long term (current) use of aspirin: Secondary | ICD-10-CM | POA: Insufficient documentation

## 2014-02-02 MED ORDER — ESCITALOPRAM OXALATE 20 MG PO TABS: 20.0000 mg | ORAL_TABLET | Freq: Every day | ORAL | Status: DC
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
## 2014-02-03 ENCOUNTER — Other Ambulatory Visit (HOSPITAL_COMMUNITY): Payer: BC Managed Care – PPO | Attending: Orthopedic Surgery

## 2014-02-03 DIAGNOSIS — I2510 Atherosclerotic heart disease of native coronary artery without angina pectoris: Secondary | ICD-10-CM | POA: Insufficient documentation

## 2014-02-03 DIAGNOSIS — Z7982 Long term (current) use of aspirin: Secondary | ICD-10-CM | POA: Insufficient documentation

## 2014-02-03 DIAGNOSIS — F332 Major depressive disorder, recurrent severe without psychotic features: Secondary | ICD-10-CM | POA: Insufficient documentation

## 2014-02-03 DIAGNOSIS — F411 Generalized anxiety disorder: Secondary | ICD-10-CM | POA: Insufficient documentation

## 2014-02-03 DIAGNOSIS — I10 Essential (primary) hypertension: Secondary | ICD-10-CM | POA: Insufficient documentation

## 2014-02-03 DIAGNOSIS — Z5987 Material hardship due to limited financial resources, not elsewhere classified: Secondary | ICD-10-CM | POA: Insufficient documentation

## 2014-02-03 DIAGNOSIS — I252 Old myocardial infarction: Secondary | ICD-10-CM | POA: Insufficient documentation

## 2014-02-03 DIAGNOSIS — Z598 Other problems related to housing and economic circumstances: Secondary | ICD-10-CM | POA: Insufficient documentation

## 2014-02-03 DIAGNOSIS — F17200 Nicotine dependence, unspecified, uncomplicated: Secondary | ICD-10-CM | POA: Insufficient documentation

## 2014-02-03 NOTE — Progress Notes (Signed)
    Daily Group Progress Note  Program: IOP  Group Time: 9:00-10:30  Participation Level: Active  Behavioral Response: Appropriate  Type of Therapy:  Group Therapy  Summary of Progress: Pt. Met with case manager and psychiatrist.     Group Time: 10:30-12:00  Participation Level:  Active  Behavioral Response: Appropriate  Type of Therapy: Psycho-education Group  Summary of Progress: Pt. Met with case manager and psychiatrist.  Brown, Jennifer B, COUNS 

## 2014-02-06 ENCOUNTER — Other Ambulatory Visit (HOSPITAL_COMMUNITY): Payer: BC Managed Care – PPO | Admitting: Psychiatry

## 2014-02-06 ENCOUNTER — Encounter (HOSPITAL_COMMUNITY): Payer: Self-pay | Admitting: Psychiatry

## 2014-02-06 DIAGNOSIS — F331 Major depressive disorder, recurrent, moderate: Secondary | ICD-10-CM
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
## 2014-02-07 ENCOUNTER — Other Ambulatory Visit (HOSPITAL_COMMUNITY): Payer: BC Managed Care – PPO | Admitting: Psychiatry

## 2014-02-07 ENCOUNTER — Encounter (HOSPITAL_COMMUNITY): Payer: Self-pay | Admitting: Psychiatry

## 2014-02-07 DIAGNOSIS — F331 Major depressive disorder, recurrent, moderate: Secondary | ICD-10-CM
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
## 2014-02-08 ENCOUNTER — Other Ambulatory Visit (HOSPITAL_COMMUNITY): Payer: BC Managed Care – PPO | Admitting: Psychiatry

## 2014-02-08 DIAGNOSIS — F331 Major depressive disorder, recurrent, moderate: Secondary | ICD-10-CM

## 2014-02-09 ENCOUNTER — Other Ambulatory Visit (HOSPITAL_COMMUNITY): Payer: BC Managed Care – PPO

## 2014-02-09 ENCOUNTER — Encounter (HOSPITAL_COMMUNITY): Payer: Self-pay | Admitting: Psychiatry

## 2014-02-09 NOTE — Progress Notes (Signed)
    Daily Group Progress Note  Program: IOP  Group Time: 9:00-10:30  Participation Level: Active  Behavioral Response: Appropriate  Type of Therapy:  Psycho-education Group  Summary of Progress: Pt. particpated in meditation exercise. Pt. Reported feeling " mentally exhausted". Pt. Reported that she had a productive day yesterday and that she is trying "to get on a spiritual path". Pt. Reported that she feels that this is her last big shot. Processed the importance of challenging all or nothing thinking.     Group Time: 10:30-12:00  Participation Level:  Active  Behavioral Response: Appropriate  Type of Therapy: Psycho-education Group  Summary of Progress: Pt. Participated in discussion about interventions for feeling positive change with [employer] video.  Nancie Neas, COUNS

## 2014-02-10 ENCOUNTER — Other Ambulatory Visit (HOSPITAL_COMMUNITY): Payer: BC Managed Care – PPO | Admitting: Psychiatry

## 2014-02-10 ENCOUNTER — Encounter (HOSPITAL_COMMUNITY): Payer: Self-pay | Admitting: Psychiatry

## 2014-02-10 DIAGNOSIS — F331 Major depressive disorder, recurrent, moderate: Secondary | ICD-10-CM
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
## 2014-02-13 ENCOUNTER — Encounter (HOSPITAL_COMMUNITY): Payer: Self-pay | Admitting: Psychiatry

## 2014-02-13 ENCOUNTER — Other Ambulatory Visit (HOSPITAL_COMMUNITY): Payer: BC Managed Care – PPO | Admitting: Psychiatry

## 2014-02-13 DIAGNOSIS — F331 Major depressive disorder, recurrent, moderate: Secondary | ICD-10-CM
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
## 2014-02-14 ENCOUNTER — Other Ambulatory Visit (HOSPITAL_COMMUNITY): Payer: BC Managed Care – PPO

## 2014-02-14 ENCOUNTER — Telehealth (HOSPITAL_COMMUNITY): Payer: Self-pay | Admitting: Psychiatry

## 2014-02-15 ENCOUNTER — Other Ambulatory Visit (HOSPITAL_COMMUNITY): Payer: BC Managed Care – PPO | Admitting: Psychiatry

## 2014-02-15 ENCOUNTER — Other Ambulatory Visit (HOSPITAL_COMMUNITY): Payer: Self-pay | Admitting: Family Medicine

## 2014-02-15 DIAGNOSIS — F331 Major depressive disorder, recurrent, moderate: Secondary | ICD-10-CM

## 2014-02-15 DIAGNOSIS — Z1231 Encounter for screening mammogram for malignant neoplasm of breast: Secondary | ICD-10-CM

## 2014-02-16 ENCOUNTER — Encounter (HOSPITAL_COMMUNITY): Payer: Self-pay | Admitting: Psychiatry

## 2014-02-16 ENCOUNTER — Other Ambulatory Visit (HOSPITAL_COMMUNITY): Payer: BC Managed Care – PPO | Admitting: Psychiatry

## 2014-02-16 DIAGNOSIS — F331 Major depressive disorder, recurrent, moderate: Secondary | ICD-10-CM

## 2014-02-16 NOTE — Progress Notes (Signed)
    Daily Group Progress Note  Program: IOP  Group Time: 9:00-10:30  Participation Level: Active  Behavioral Response: Appropriate  Type of Therapy:  Group Therapy  Summary of Progress: Pt. Participated in meditation exercise. Pt. Reported that she slept well the previous night but she was feeling "exhausted". Pt. Processed critical thoughts towards herself and belief that "I am hopeless". Pt. Challenged herself with another patient to follow up on scheduling preventative healthcare appointments.     Group Time: 10:30-12:00  Participation Level:  Active  Behavioral Response: Appropriate  Type of Therapy: Psycho-education Group  Summary of Progress: Pt. Was alert and attentive during discussion about [place] facilitated by Josh.  Nancie Neas, COUNS
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
## 2014-02-17 ENCOUNTER — Other Ambulatory Visit (HOSPITAL_COMMUNITY): Payer: BC Managed Care – PPO | Admitting: Psychiatry

## 2014-02-21 ENCOUNTER — Encounter (HOSPITAL_COMMUNITY): Payer: Self-pay | Admitting: Psychiatry

## 2014-02-21 ENCOUNTER — Other Ambulatory Visit (HOSPITAL_COMMUNITY): Payer: BC Managed Care – PPO | Admitting: Psychiatry

## 2014-02-21 DIAGNOSIS — F331 Major depressive disorder, recurrent, moderate: Secondary | ICD-10-CM

## 2014-02-21 NOTE — Progress Notes (Signed)
    Daily Group Progress Note  Program: IOP  Group Time: 9:00-10:30  Participation Level: Active  Behavioral Response: Appropriate  Type of Therapy:  Group Therapy  Summary of Progress: Pt. Participated in meditation exercise. Pt. Presented as talkative, smiled and laughed appropriately. Pt. Reported that she was feeling "pretty good". Pt. 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     Group Time: 10:30-12:00  Participation Level:  Active  Behavioral Response: Appropriate  Type of Therapy: Psycho-education Group  Summary of Progress: Pt. Participated in activity to develop personal guided meditations.  Nancie Neas, COUNS

## 2014-02-22 ENCOUNTER — Other Ambulatory Visit (HOSPITAL_COMMUNITY): Payer: BC Managed Care – PPO

## 2014-02-23 ENCOUNTER — Other Ambulatory Visit (HOSPITAL_COMMUNITY): Payer: BC Managed Care – PPO

## 2014-02-23 ENCOUNTER — Ambulatory Visit (HOSPITAL_COMMUNITY)
Admission: RE | Admit: 2014-02-23 | Discharge: 2014-02-23 | Disposition: A | Discharge status: Home / Self Care | Payer: BC Managed Care – PPO | Source: Ambulatory Visit | Attending: Family Medicine | Admitting: Family Medicine

## 2014-02-23 DIAGNOSIS — R928 Other abnormal and inconclusive findings on diagnostic imaging of breast: Secondary | ICD-10-CM | POA: Insufficient documentation

## 2014-02-23 DIAGNOSIS — Z1231 Encounter for screening mammogram for malignant neoplasm of breast: Secondary | ICD-10-CM | POA: Insufficient documentation

## 2014-02-23 IMAGING — MG MM DIGITAL SCREENING
6 series · 6 of 6 positions shown · non-contrast
Comparison: None.

CLINICAL DATA: Screening.

EXAM:
DIGITAL SCREENING BILATERAL MAMMOGRAM WITH CAD

[L CC (1 of 2)]
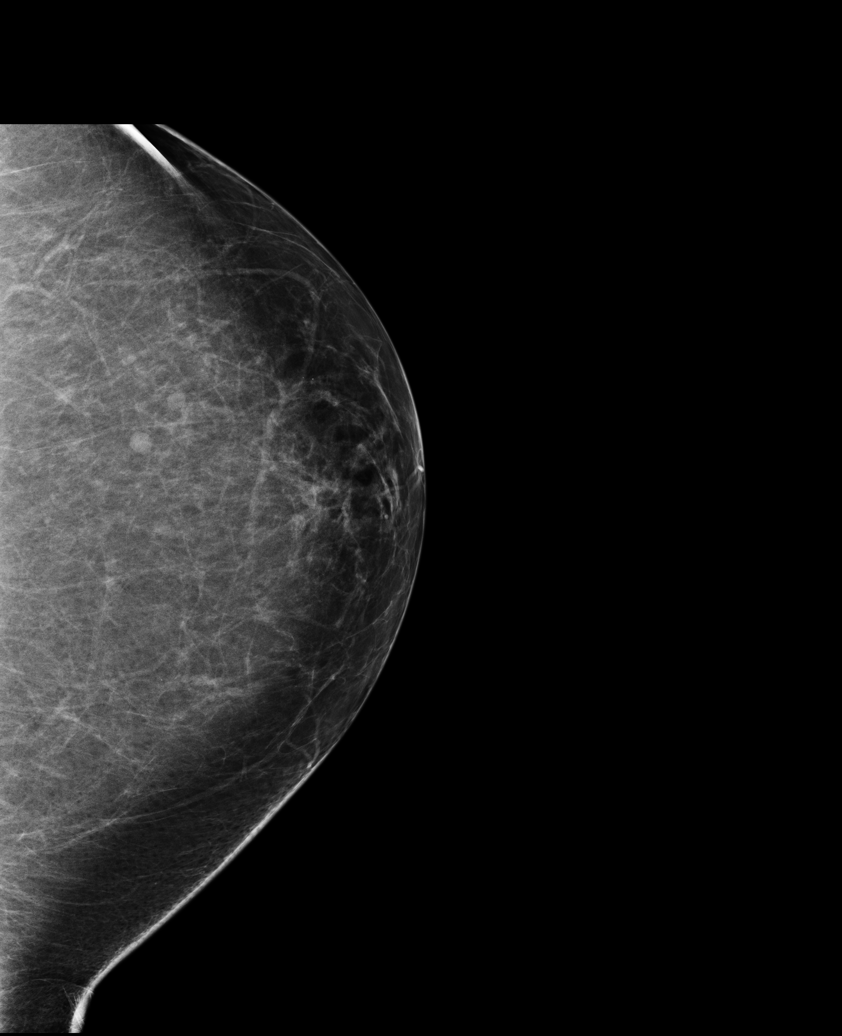

[L MLO]
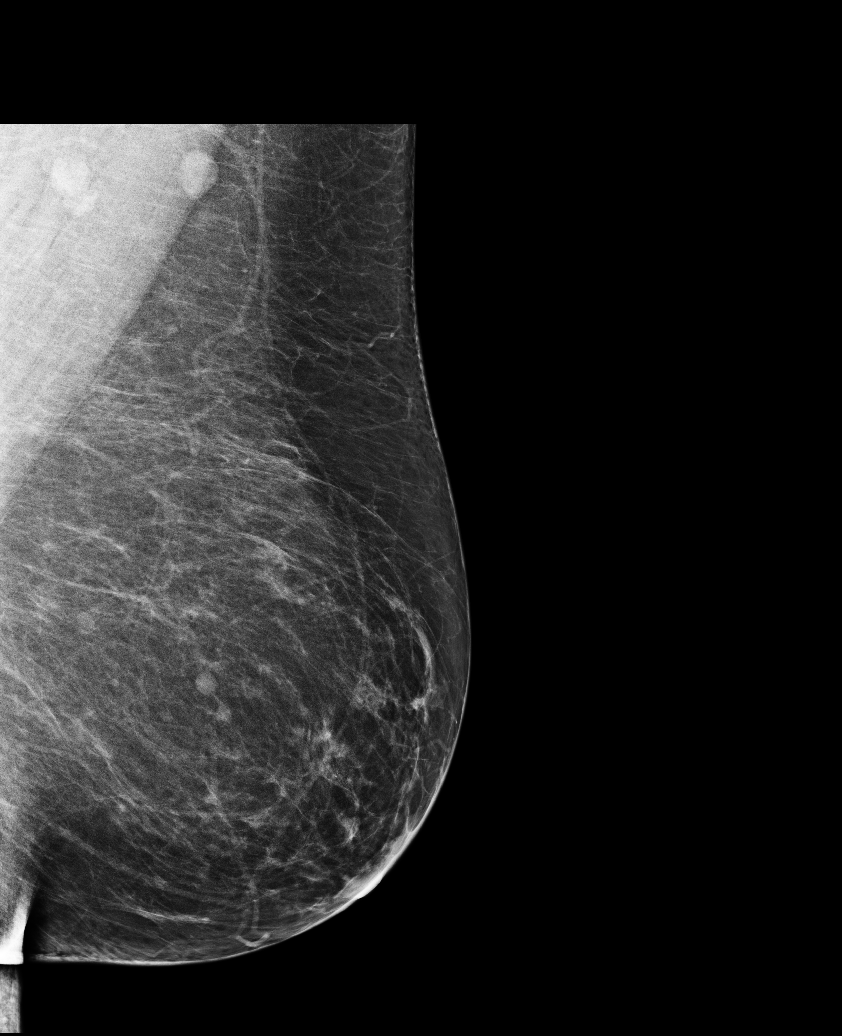

[R CC]
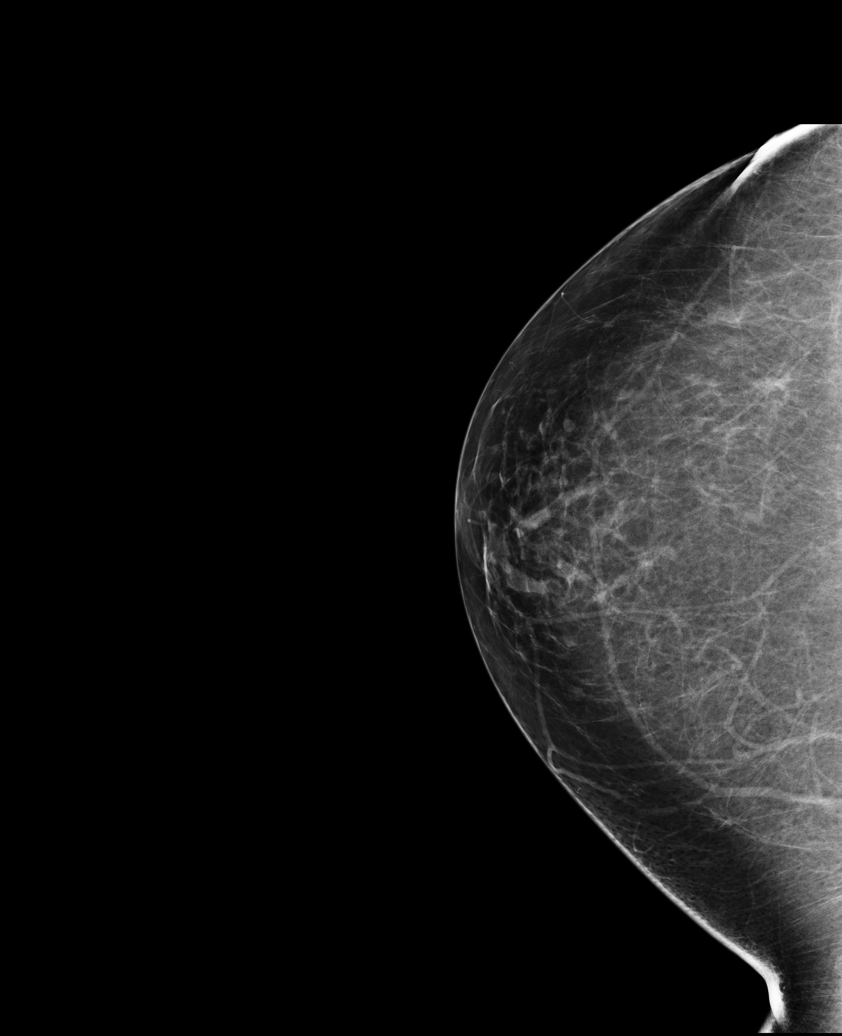

[R MLO (1 of 2)]
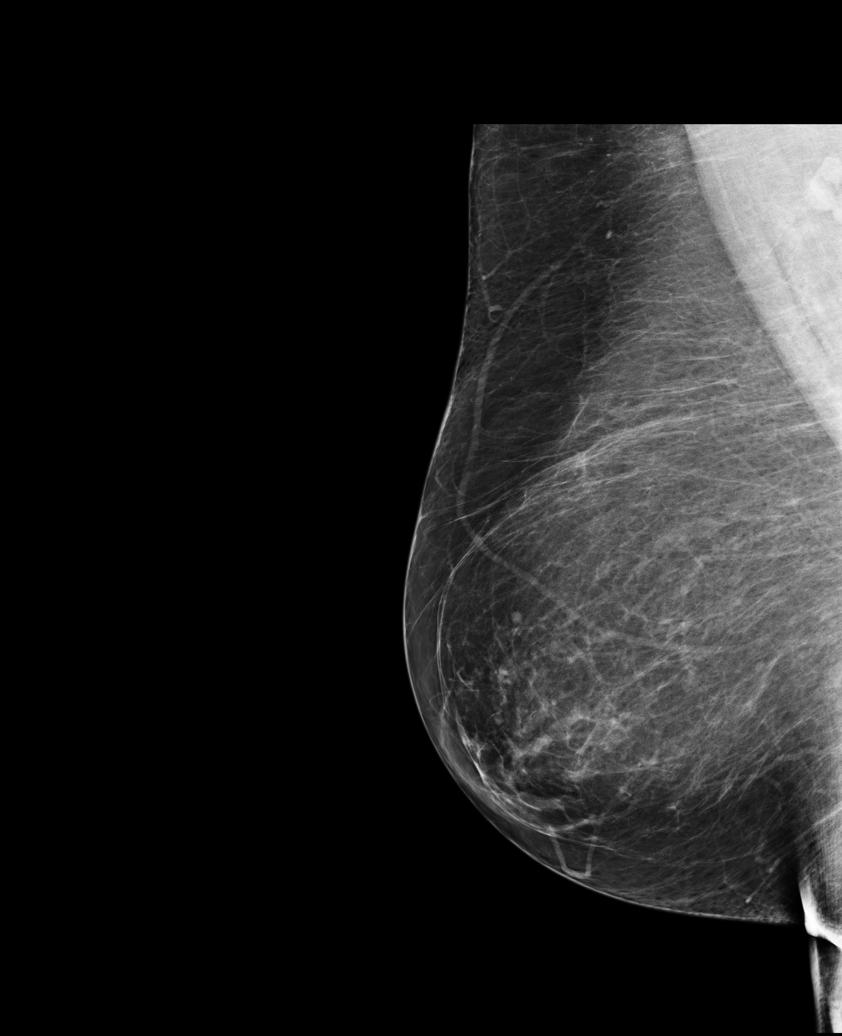

[L CC (2 of 2)]
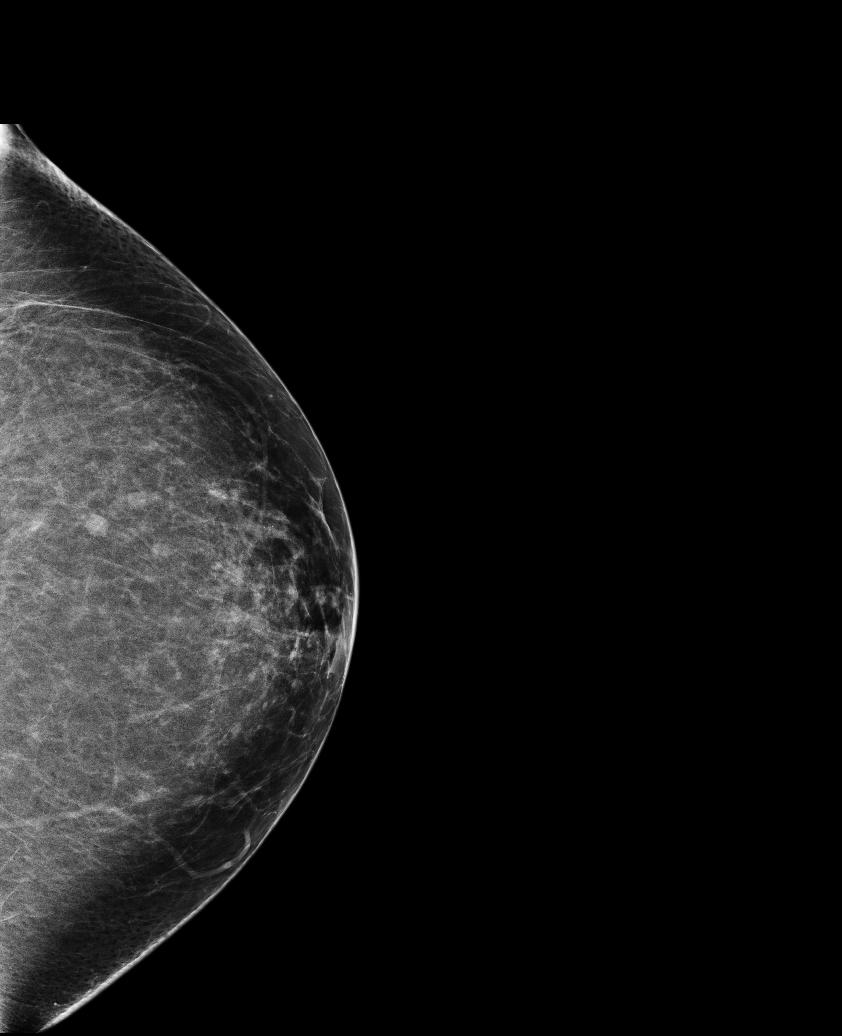

[R MLO (2 of 2)]
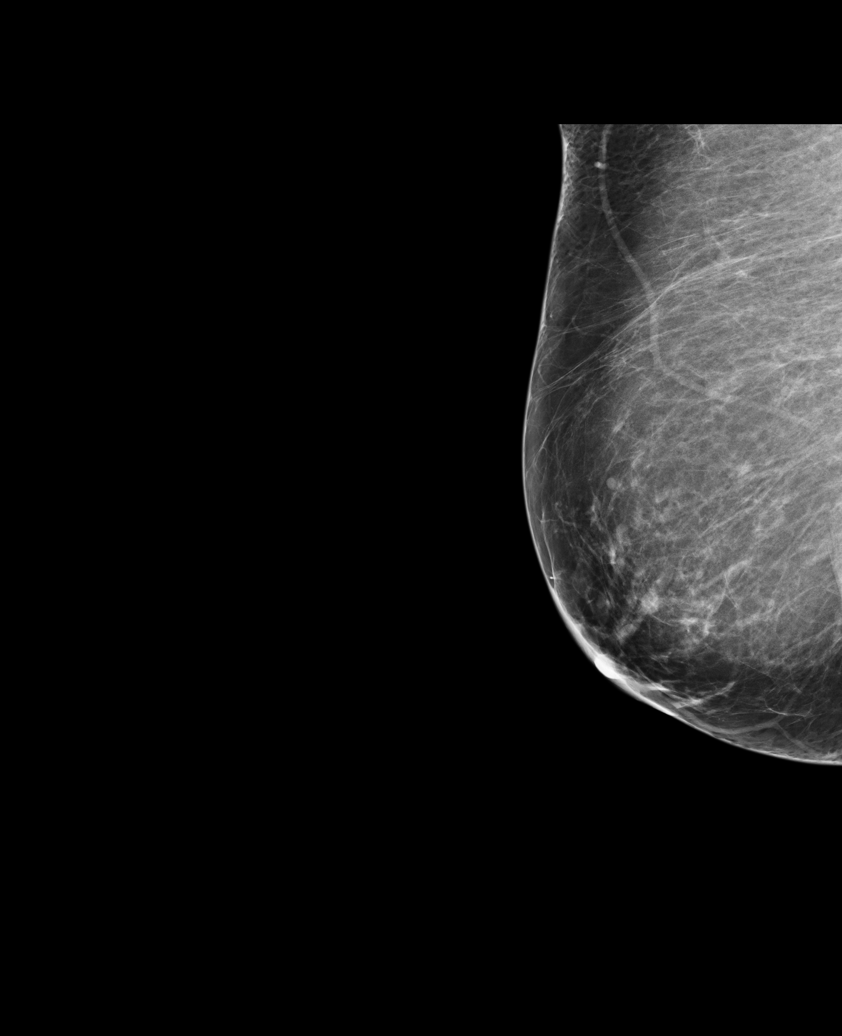

[6 of 6 positions shown; findings below may reference images not displayed]

ACR Breast Density Category b: There are scattered areas of
fibroglandular density.
FINDINGS: In the left breast possible mass warrants further imaging evaluation
with spot compression views and possibly ultrasound. In the right
breast, possible mass warrants further imaging evaluation with spot
compression views and possibly ultrasound. Images were processed
with CAD.
IMPRESSION: Further imaging evaluation is suggested for possible mass in the
left breast.

Further imaging evaluation is suggested for possible mass in the
right breast.

RECOMMENDATION:
Diagnostic mammogram and possibly ultrasound of both breasts.
(Code:[PB])

The patient will be contacted regarding the findings, and additional
imaging will be scheduled.

BI-RADS CATEGORY  0: Incomplete. Need additional imaging evaluation
and/or prior mammograms for comparison.

## 2014-02-24 ENCOUNTER — Encounter (HOSPITAL_COMMUNITY): Payer: Self-pay | Admitting: Psychiatry

## 2014-02-24 ENCOUNTER — Other Ambulatory Visit (HOSPITAL_COMMUNITY): Payer: BC Managed Care – PPO | Admitting: Psychiatry

## 2014-02-24 DIAGNOSIS — F331 Major depressive disorder, recurrent, moderate: Secondary | ICD-10-CM
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
## 2014-02-27 ENCOUNTER — Encounter (HOSPITAL_COMMUNITY): Payer: Self-pay | Admitting: Psychiatry

## 2014-02-27 ENCOUNTER — Other Ambulatory Visit: Payer: Self-pay | Admitting: Family Medicine

## 2014-02-27 ENCOUNTER — Other Ambulatory Visit (HOSPITAL_COMMUNITY): Payer: BC Managed Care – PPO | Attending: Orthopedic Surgery | Admitting: Psychiatry

## 2014-02-27 DIAGNOSIS — I252 Old myocardial infarction: Secondary | ICD-10-CM | POA: Insufficient documentation

## 2014-02-27 DIAGNOSIS — F331 Major depressive disorder, recurrent, moderate: Secondary | ICD-10-CM

## 2014-02-27 DIAGNOSIS — F17200 Nicotine dependence, unspecified, uncomplicated: Secondary | ICD-10-CM | POA: Insufficient documentation

## 2014-02-27 DIAGNOSIS — R928 Other abnormal and inconclusive findings on diagnostic imaging of breast: Secondary | ICD-10-CM

## 2014-02-27 DIAGNOSIS — Z5987 Material hardship due to limited financial resources, not elsewhere classified: Secondary | ICD-10-CM | POA: Insufficient documentation

## 2014-02-27 DIAGNOSIS — I10 Essential (primary) hypertension: Secondary | ICD-10-CM | POA: Insufficient documentation

## 2014-02-27 DIAGNOSIS — I2510 Atherosclerotic heart disease of native coronary artery without angina pectoris: Secondary | ICD-10-CM | POA: Insufficient documentation

## 2014-02-27 DIAGNOSIS — F411 Generalized anxiety disorder: Secondary | ICD-10-CM | POA: Insufficient documentation

## 2014-02-27 DIAGNOSIS — Z598 Other problems related to housing and economic circumstances: Secondary | ICD-10-CM | POA: Insufficient documentation

## 2014-02-27 DIAGNOSIS — F332 Major depressive disorder, recurrent severe without psychotic features: Secondary | ICD-10-CM | POA: Insufficient documentation

## 2014-02-27 DIAGNOSIS — Z7982 Long term (current) use of aspirin: Secondary | ICD-10-CM | POA: Insufficient documentation

## 2014-02-27 DIAGNOSIS — E785 Hyperlipidemia, unspecified: Secondary | ICD-10-CM | POA: Insufficient documentation

## 2014-02-27 NOTE — Telephone Encounter (Signed)
A:  Placed call to [employer] [phone]) to request an extension from work.  Spoke to Jana Half and she states medical records need to be sent to the company starting June 1st.

## 2014-02-27 NOTE — Progress Notes (Signed)
    Daily Group Progress Note  Program: IOP  Group Time: 9:00-10:30  Participation Level: Active  Behavioral Response: Appropriate  Type of Therapy:  Group Therapy  Summary of Progress: Pt. Participated in meditation exercise.  Pt. Reported that she was challenged by negative thoughts over the weekend. Pt. Reported that she continues to be challenged by lethargy and finding motivation to engage in daily activities. Pt. Continues to talk openly in group about her relationship and career stressors.     Group Time: 10:30-12:00  Participation Level:  Active  Behavioral Response: Appropriate  Type of Therapy: Psycho-education Group  Summary of Progress: Pt. Participated in grief and loss group facilitated by Jeanella Craze.  Nancie Neas, COUNS

## 2014-02-28 ENCOUNTER — Other Ambulatory Visit (HOSPITAL_COMMUNITY): Payer: BC Managed Care – PPO | Admitting: Psychiatry

## 2014-03-01 ENCOUNTER — Other Ambulatory Visit (HOSPITAL_COMMUNITY): Payer: BC Managed Care – PPO | Admitting: Psychiatry

## 2014-03-02 ENCOUNTER — Other Ambulatory Visit (HOSPITAL_COMMUNITY): Payer: BC Managed Care – PPO | Admitting: Psychiatry

## 2014-03-02 DIAGNOSIS — F331 Major depressive disorder, recurrent, moderate: Secondary | ICD-10-CM

## 2014-03-03 ENCOUNTER — Encounter (HOSPITAL_COMMUNITY): Payer: Self-pay | Admitting: Psychiatry
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
## 2014-03-03 NOTE — Progress Notes (Signed)
Patient ID: Shannon [name], female   DOB: July 08, 1966, 48 y.o.   MRN: [id number] Pt seen with Dellia Nims on 03/02/14 for follow up , Pt has misssed 6 days and wants more days , discussed Insurance was not approving and due to the fact that she had missed so many days ,  Pt reports multiple health problems leading to her missing days , discussed continued presense was important at IOP for therapeutic effectiveness.  States sleep -fair, appetite -fair, mood anxious No suicidal /homicidal ideation, no hallucinations / delusions.tol meds well

## 2014-03-03 NOTE — Patient Instructions (Signed)
Patient completed MH-IOP today.  Will follow up with Dr. Harrington Challenger on 03-08-14 @ 11 a.m. [phone]:  [address], [place], [place]).  Pt will also follow up with Floria Raveling, LCSW [phone]).  Encouraged support groups. Requested an extension for work through 03-08-14, until pt sees Dr. Harrington Challenger.
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
## 2014-03-08 ENCOUNTER — Ambulatory Visit (HOSPITAL_COMMUNITY)
Admission: RE | Admit: 2014-03-08 | Discharge: 2014-03-08 | Disposition: A | Discharge status: Home / Self Care | Payer: BC Managed Care – PPO | Source: Ambulatory Visit | Attending: Family Medicine | Admitting: Family Medicine

## 2014-03-08 ENCOUNTER — Other Ambulatory Visit: Payer: Self-pay | Admitting: Family Medicine

## 2014-03-08 ENCOUNTER — Ambulatory Visit (HOSPITAL_COMMUNITY): Payer: BC Managed Care – PPO | Admitting: Psychiatry

## 2014-03-08 DIAGNOSIS — R928 Other abnormal and inconclusive findings on diagnostic imaging of breast: Secondary | ICD-10-CM

## 2014-03-08 DIAGNOSIS — N6019 Diffuse cystic mastopathy of unspecified breast: Secondary | ICD-10-CM | POA: Insufficient documentation

## 2014-03-08 IMAGING — US US BREAST LTD UNI LEFT INC AXILLA
1 series · 13 of 16 positions shown · non-contrast
Comparison: Screening mammogram dated [DATE].

CLINICAL DATA: Possible bilateral breast masses at recent screening
mammography.

EXAM:
DIGITAL DIAGNOSTIC  BILATERAL MAMMOGRAM WITH CAD
ULTRASOUND BILATERAL BREAST

[Series 1: us breast ltd uni left inc axilla · 0.06mm/px · 13 of 16 slices shown]
[im 1/16]
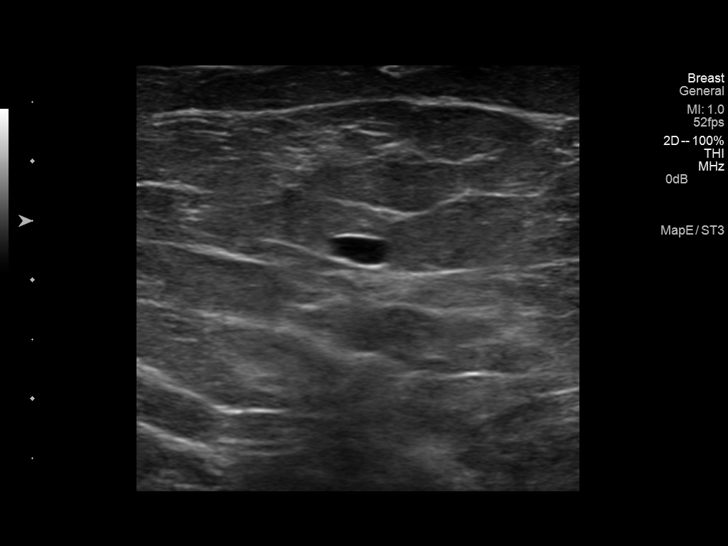
[im 2/16]
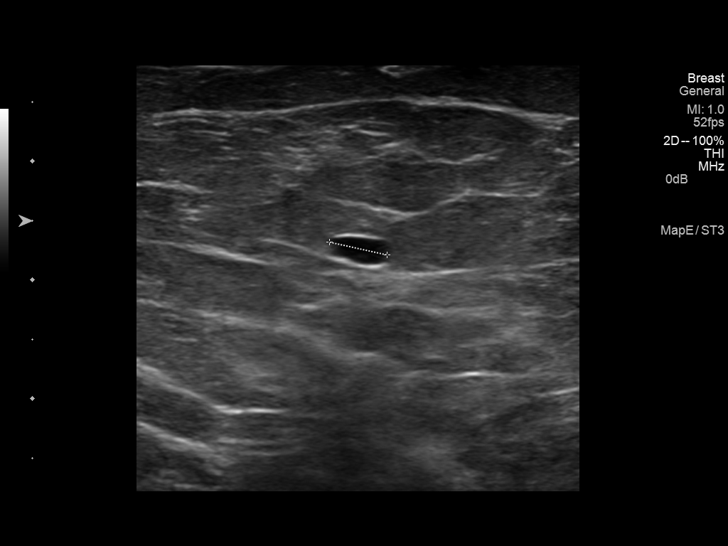
[im 4/16]
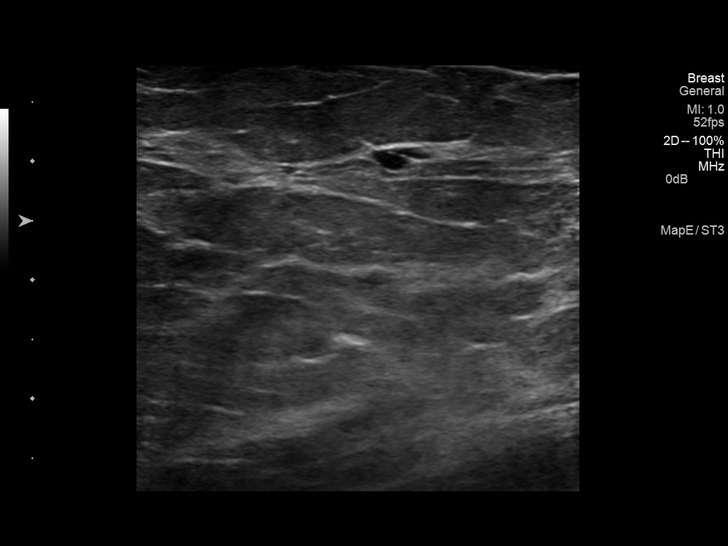
[im 5/16]
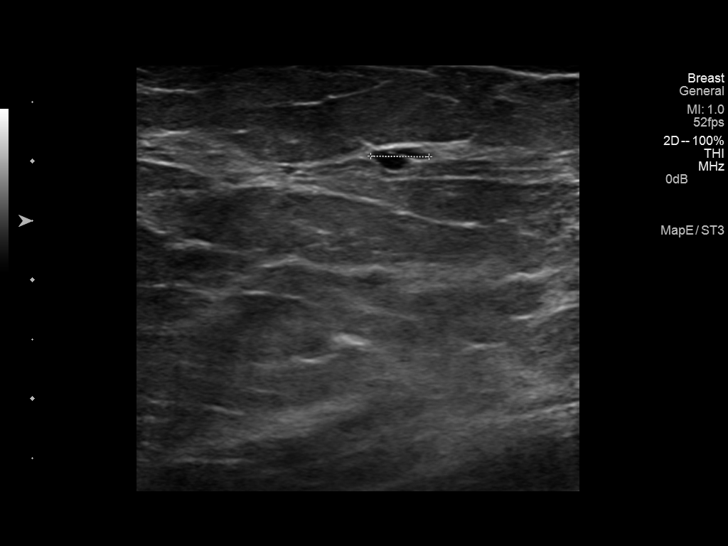
[im 6/16]
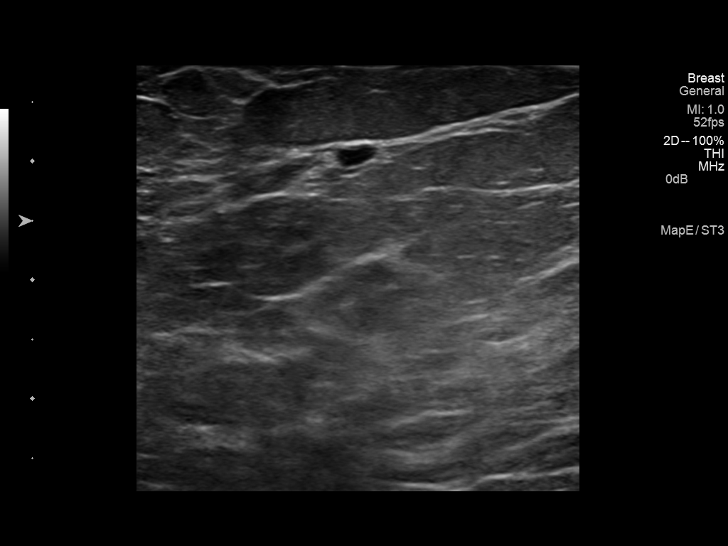
[im 7/16]
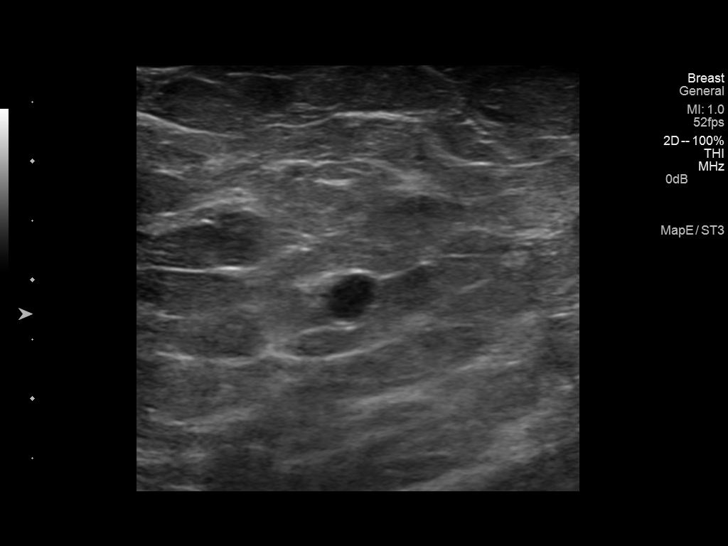
[im 9/16]
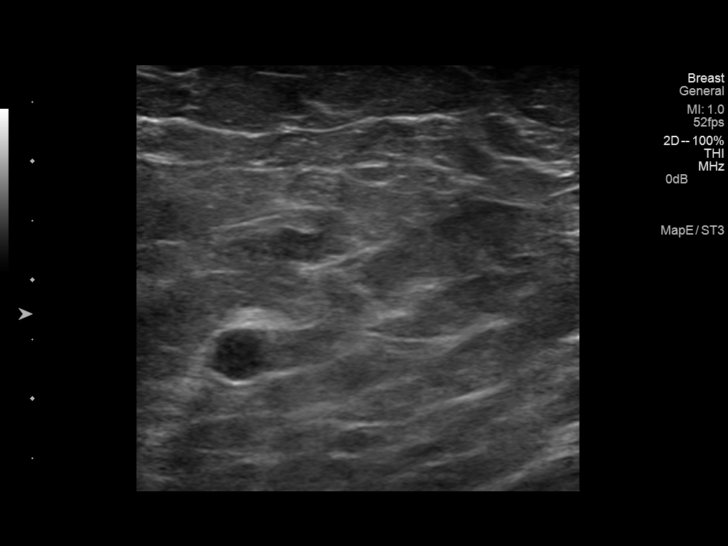
[im 10/16]
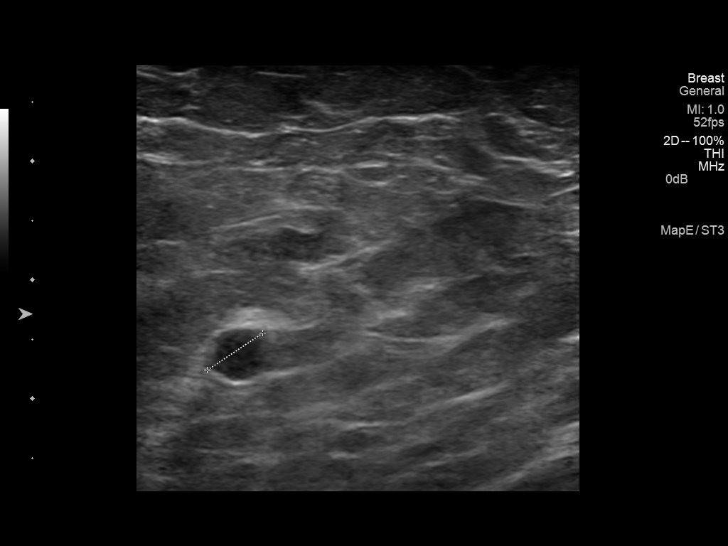
[im 11/16]
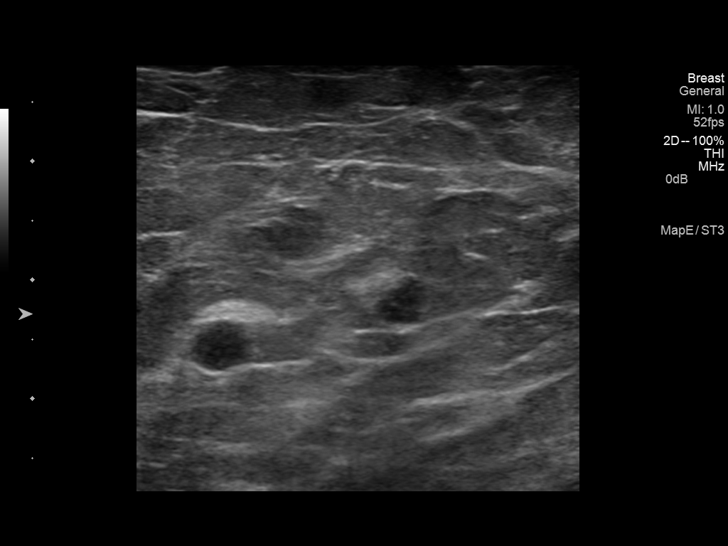
[im 12/16]
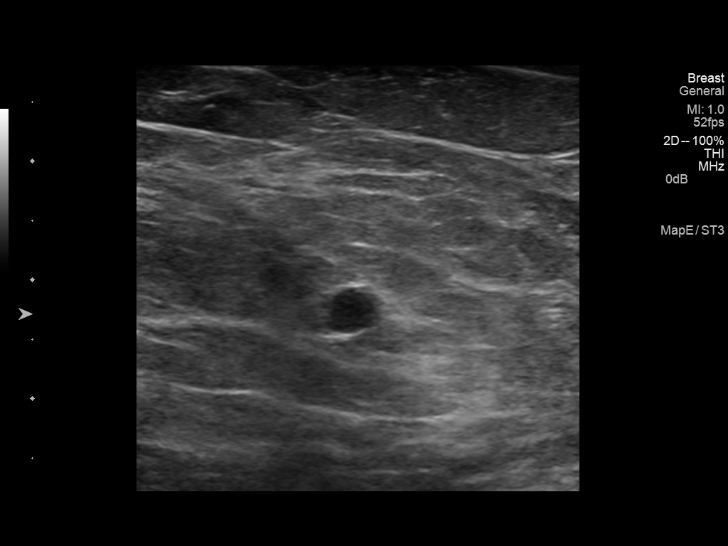
[im 13/16]
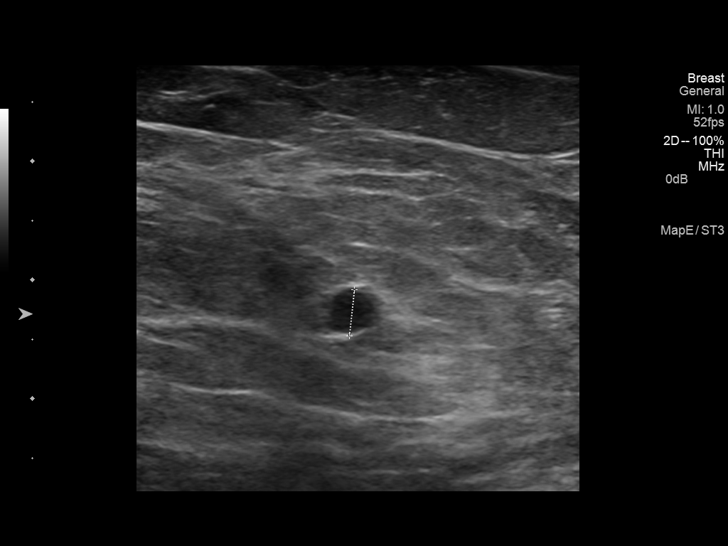
[im 15/16]
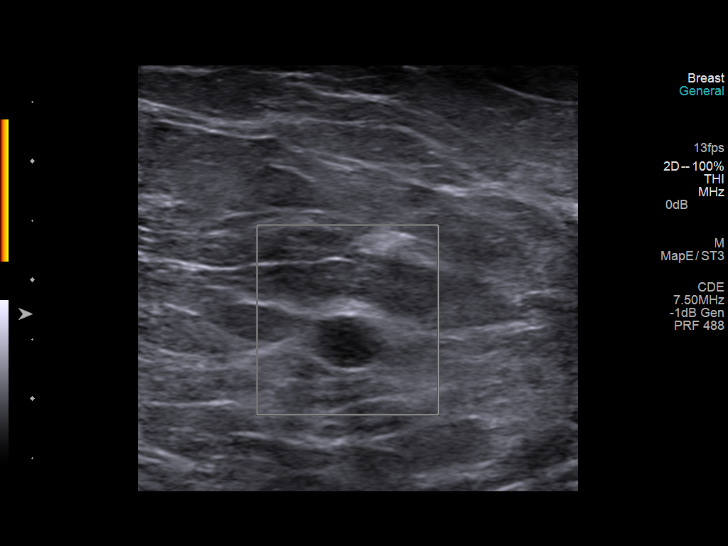
[im 16/16]
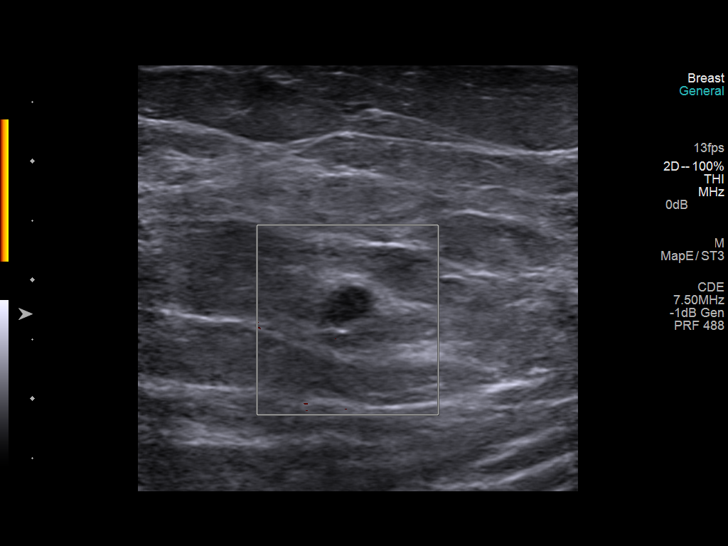

[13 of 16 positions shown; findings below may reference images not displayed]

ACR Breast Density Category b: There are scattered areas of
fibroglandular density.
FINDINGS: True lateral and spot compression views of both breasts confirm
small, oval, circumscribed nodular densities in both breasts.

Mammographic images were processed with CAD.

On physical exam, no mass is palpable in either breast.

Ultrasound is performed, showing a 5 mm cyst in the 11 o'clock
position of the right breast, 4 cm from the nipple. A 5 mm cyst in
the 1:30 o'clock position of the left breast, 5 cm from the nipple.
6 mm and 4 mm cysts containing low level internal echoes in the 3
o'clock position of the left breast, 3 cm from the nipple.
IMPRESSION: Bilateral breast cysts.  No evidence of malignancy.

RECOMMENDATION:
Bilateral screening mammogram in 1 year.

I have discussed the findings and recommendations with the patient.
Results were also provided in writing at the conclusion of the
visit. If applicable, a reminder letter will be sent to the patient
regarding the next appointment.

BI-RADS CATEGORY  2: Benign.

## 2014-04-11 ENCOUNTER — Ambulatory Visit: Payer: Self-pay | Admitting: Cardiovascular Disease

## 2014-04-17 ENCOUNTER — Ambulatory Visit: Payer: BC Managed Care – PPO | Admitting: Psychology

## 2014-04-25 ENCOUNTER — Ambulatory Visit: Payer: Self-pay | Admitting: Cardiovascular Disease

## 2014-05-09 ENCOUNTER — Telehealth: Payer: Self-pay | Admitting: Cardiovascular Disease

## 2014-05-09 NOTE — Telephone Encounter (Signed)
Signed [facility] paperwork never picked up By Pt From 8.28.2014 this was placed In [place]   8.11.15/km

## 2014-09-07 ENCOUNTER — Encounter (HOSPITAL_COMMUNITY): Payer: Self-pay | Admitting: Cardiovascular Disease

## 2015-01-15 ENCOUNTER — Other Ambulatory Visit (HOSPITAL_COMMUNITY): Payer: Self-pay | Admitting: Family Medicine

## 2015-01-15 DIAGNOSIS — I6523 Occlusion and stenosis of bilateral carotid arteries: Secondary | ICD-10-CM

## 2015-01-17 ENCOUNTER — Ambulatory Visit (HOSPITAL_COMMUNITY): Admission: RE | Admit: 2015-01-17 | Payer: Self-pay | Source: Ambulatory Visit

## 2015-01-24 ENCOUNTER — Ambulatory Visit (HOSPITAL_COMMUNITY)
Admission: RE | Admit: 2015-01-24 | Discharge: 2015-01-24 | Disposition: A | Discharge status: Home / Self Care | Payer: Self-pay | Source: Ambulatory Visit | Attending: Family Medicine | Admitting: Family Medicine

## 2015-01-24 DIAGNOSIS — I6523 Occlusion and stenosis of bilateral carotid arteries: Secondary | ICD-10-CM | POA: Insufficient documentation

## 2015-01-24 DIAGNOSIS — E785 Hyperlipidemia, unspecified: Secondary | ICD-10-CM | POA: Insufficient documentation

## 2015-01-24 DIAGNOSIS — I10 Essential (primary) hypertension: Secondary | ICD-10-CM | POA: Insufficient documentation

## 2015-01-24 DIAGNOSIS — Z720 Tobacco use: Secondary | ICD-10-CM | POA: Insufficient documentation

## 2015-01-24 IMAGING — US US CAROTID DUPLEX BILAT
1 series · 13 of 24 positions shown · non-contrast
Comparison: None.

CLINICAL DATA: Carotid atherosclerosis, hypertension,
hyperlipidemia, smoker

EXAM:
BILATERAL CAROTID DUPLEX ULTRASOUND
TECHNIQUE: Gray scale imaging, color Doppler and duplex ultrasound were
performed of bilateral carotid and vertebral arteries in the neck.

[Series 1: us carotid duplex bilat · 0.07mm/px · 13 of 69 slices shown]
[im 1/69]
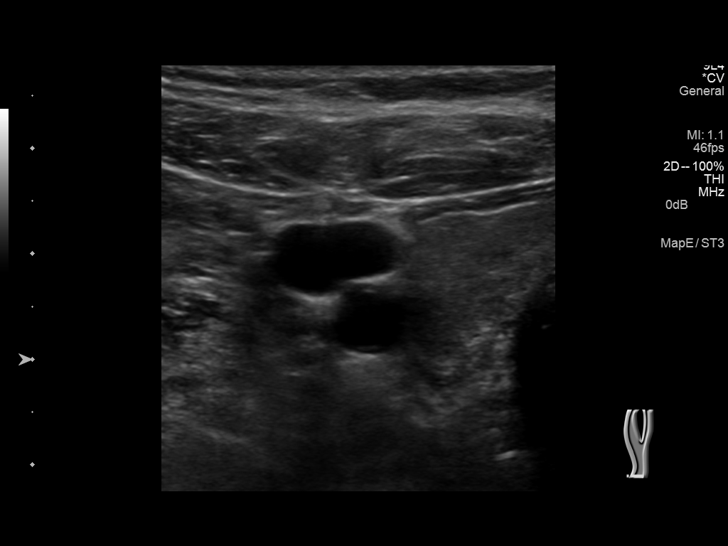
[im 6/69]
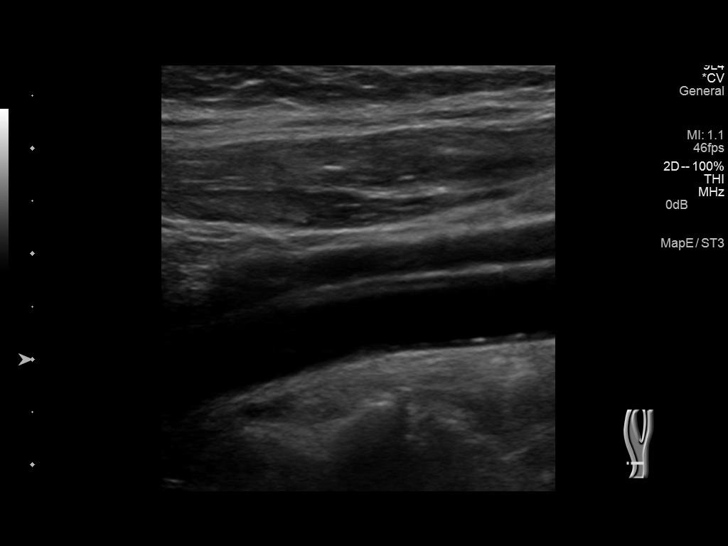
[im 12/69]
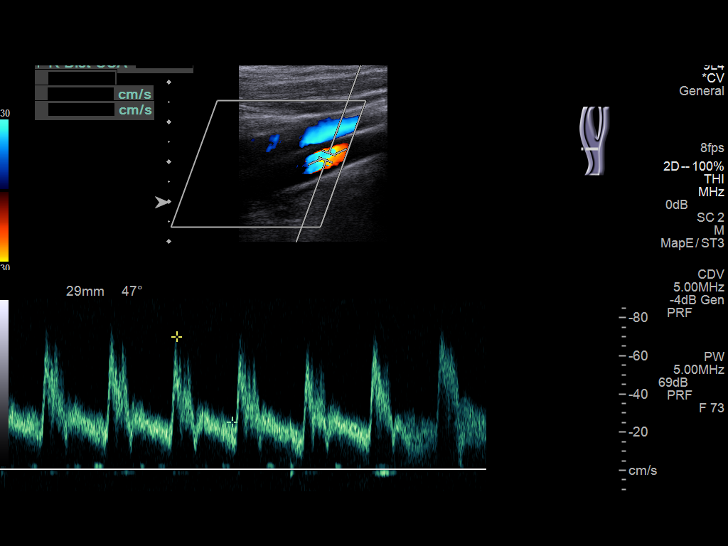
[im 18/69]
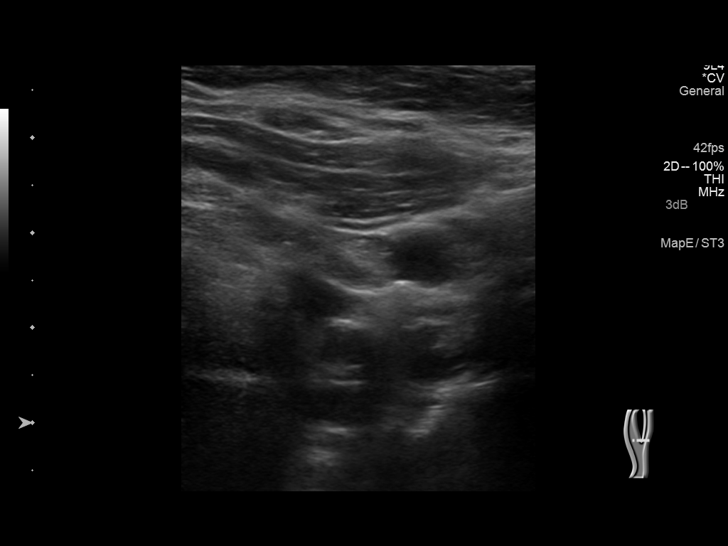
[im 24/69]
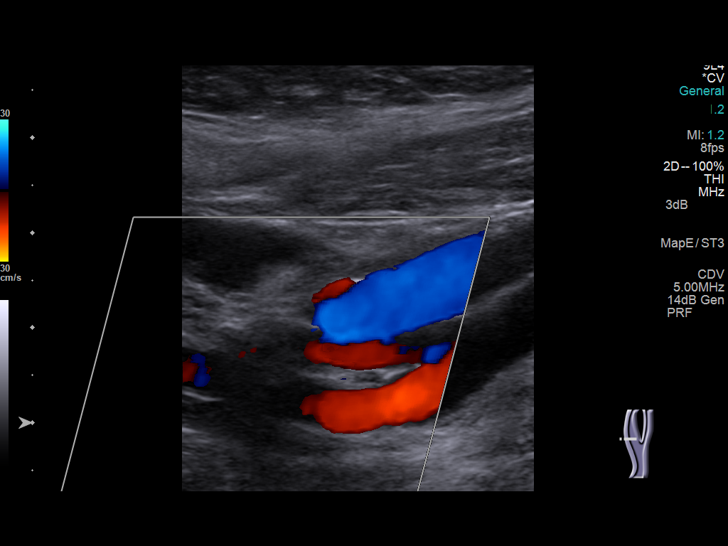
[im 30/69]
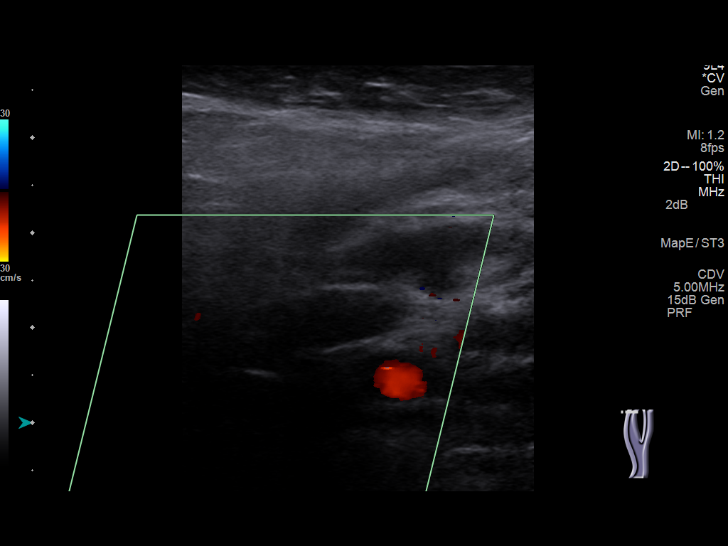
[im 36/69]
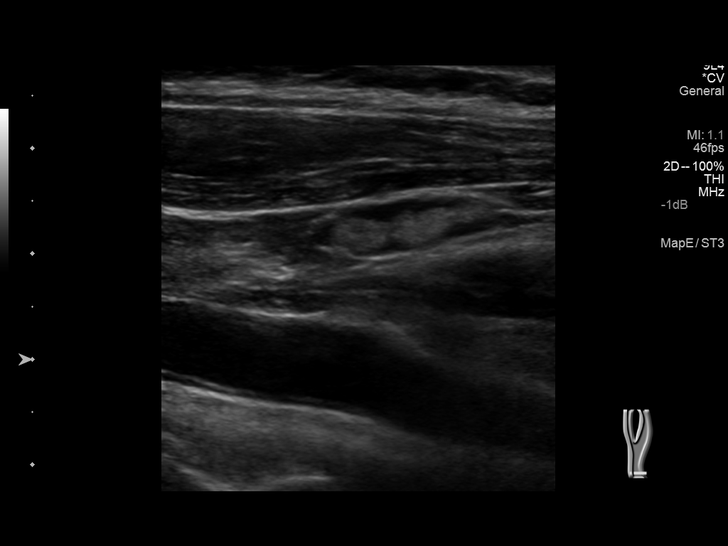
[im 39/69]
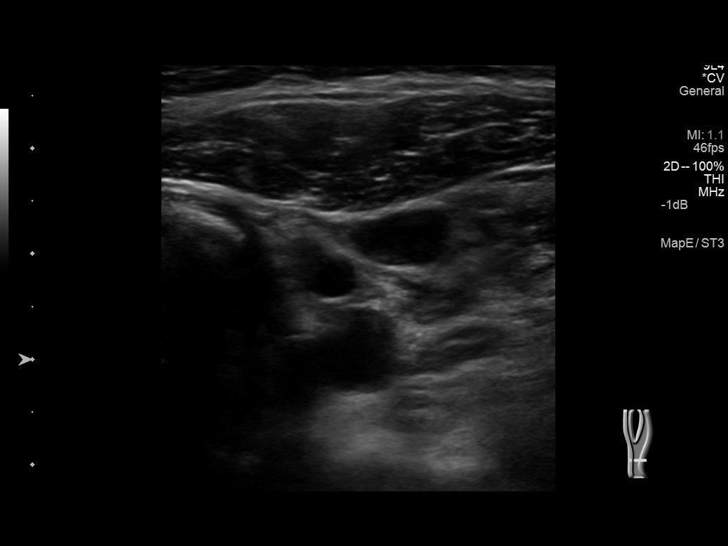
[im 45/69]
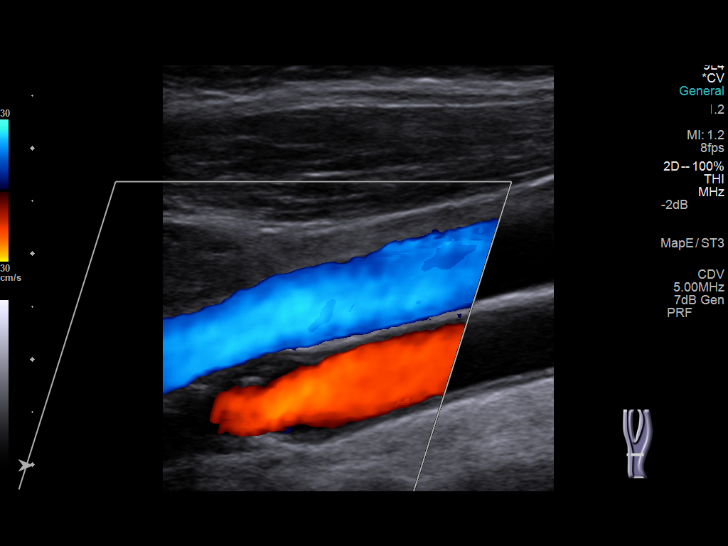
[im 51/69]
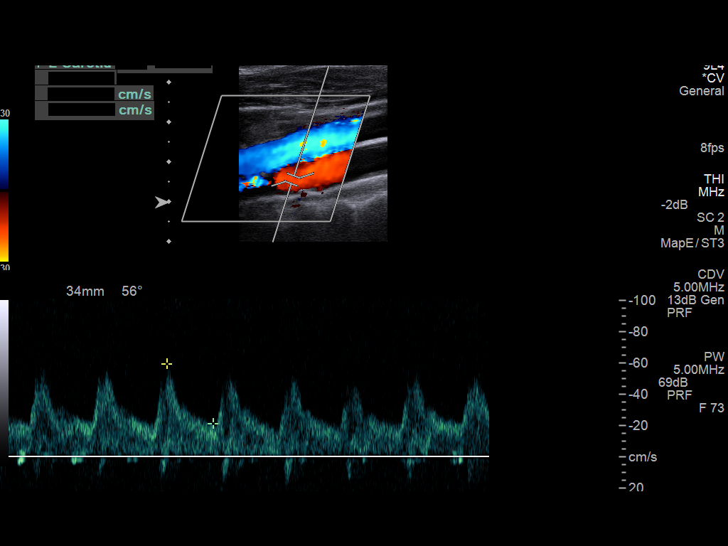
[im 57/69]
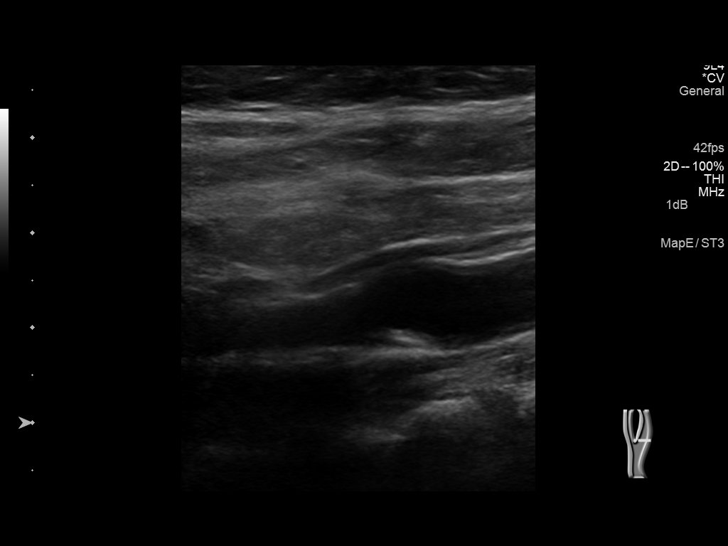
[im 63/69]
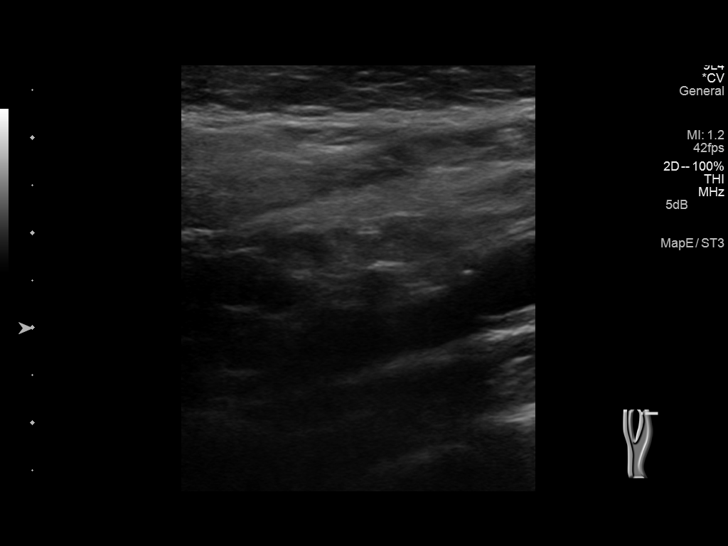
[im 69/69]
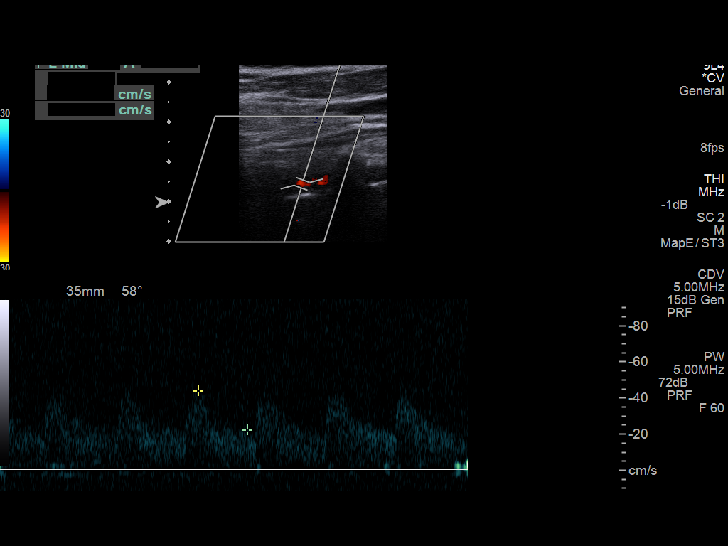

[13 of 24 positions shown; findings below may reference images not displayed]

FINDINGS: Criteria: Quantification of carotid stenosis is based on velocity
parameters that correlate the residual internal carotid diameter
with NASCET-based stenosis levels, using the diameter of the distal
internal carotid lumen as the denominator for stenosis measurement.

The following velocity measurements were obtained:

RIGHT

ICA:  84/36 cm/sec

CCA:  88/37 cm/sec

SYSTOLIC ICA/CCA RATIO:

DIASTOLIC ICA/CCA RATIO:

ECA:  123 cm/sec

LEFT

ICA:  265/107 cm/sec

CCA:  70/25 cm/sec

SYSTOLIC ICA/CCA RATIO:

DIASTOLIC ICA/CCA RATIO:

ECA:  60 cm/sec

RIGHT CAROTID ARTERY: Minor echogenic shadowing plaque formation. No
hemodynamically significant right ICA stenosis, velocity elevation,
or turbulent flow. Degree of narrowing less than 50%.

RIGHT VERTEBRAL ARTERY:  Antegrade

LEFT CAROTID ARTERY: Mid left ICA elevated velocity measures 265/1 7
centimeters/second with turbulent flow and spectral broadening
noted. Lumen is significantly narrowed by grayscale imaging. Degree
of stenosis is estimated at greater than 70%, possibly
hemodynamically significant.

LEFT VERTEBRAL ARTERY:  Antegrade
IMPRESSION: Left greater than right carotid atherosclerosis.

Left ICA stenosis estimated greater than 70%

Right ICA narrowing less than 50%

## 2015-02-19 ENCOUNTER — Ambulatory Visit: Payer: Self-pay | Admitting: Cardiovascular Disease

## 2015-03-09 ENCOUNTER — Ambulatory Visit: Payer: Self-pay | Admitting: Cardiology

## 2015-03-22 ENCOUNTER — Emergency Department (HOSPITAL_COMMUNITY): Payer: 59

## 2015-03-22 ENCOUNTER — Encounter (HOSPITAL_COMMUNITY): Payer: Self-pay | Admitting: *Deleted

## 2015-03-22 ENCOUNTER — Emergency Department (HOSPITAL_COMMUNITY)
Admission: EM | Admit: 2015-03-22 | Discharge: 2015-03-23 | Disposition: A | Discharge status: Home / Self Care | Payer: 59 | Attending: Emergency Medicine | Admitting: Emergency Medicine

## 2015-03-22 DIAGNOSIS — Z7982 Long term (current) use of aspirin: Secondary | ICD-10-CM | POA: Diagnosis not present

## 2015-03-22 DIAGNOSIS — I10 Essential (primary) hypertension: Secondary | ICD-10-CM | POA: Insufficient documentation

## 2015-03-22 DIAGNOSIS — Z8639 Personal history of other endocrine, nutritional and metabolic disease: Secondary | ICD-10-CM | POA: Diagnosis not present

## 2015-03-22 DIAGNOSIS — F329 Major depressive disorder, single episode, unspecified: Secondary | ICD-10-CM | POA: Diagnosis not present

## 2015-03-22 DIAGNOSIS — I252 Old myocardial infarction: Secondary | ICD-10-CM | POA: Insufficient documentation

## 2015-03-22 DIAGNOSIS — R079 Chest pain, unspecified: Secondary | ICD-10-CM | POA: Diagnosis not present

## 2015-03-22 DIAGNOSIS — Z720 Tobacco use: Secondary | ICD-10-CM | POA: Diagnosis not present

## 2015-03-22 DIAGNOSIS — F419 Anxiety disorder, unspecified: Secondary | ICD-10-CM | POA: Insufficient documentation

## 2015-03-22 LAB — CBC WITH DIFFERENTIAL/PLATELET
Basophils Absolute: 0 10*3/uL (ref 0.0–0.1)
Basophils Relative: 0 % (ref 0–1)
Eosinophils Absolute: 0.2 10*3/uL (ref 0.0–0.7)
Eosinophils Relative: 2 % (ref 0–5)
HEMATOCRIT: 41.5 % (ref 36.0–46.0)
Hemoglobin: 13.6 g/dL (ref 12.0–15.0)
LYMPHS PCT: 44 % (ref 12–46)
Lymphs Abs: 4.5 10*3/uL — ABNORMAL HIGH (ref 0.7–4.0)
MCH: 28.6 pg (ref 26.0–34.0)
MCHC: 32.8 g/dL (ref 30.0–36.0)
MCV: 87.4 fL (ref 78.0–100.0)
MONO ABS: 0.6 10*3/uL (ref 0.1–1.0)
Monocytes Relative: 6 % (ref 3–12)
Neutro Abs: 5 10*3/uL (ref 1.7–7.7)
Neutrophils Relative %: 48 % (ref 43–77)
Platelets: 155 10*3/uL (ref 150–400)
RBC: 4.75 MIL/uL (ref 3.87–5.11)
RDW: 14.8 % (ref 11.5–15.5)
WBC: 10.3 10*3/uL (ref 4.0–10.5)

## 2015-03-22 LAB — BASIC METABOLIC PANEL
ANION GAP: 9 (ref 5–15)
BUN: 10 mg/dL (ref 6–20)
CALCIUM: 8.7 mg/dL — AB (ref 8.9–10.3)
CO2: 24 mmol/L (ref 22–32)
Chloride: 105 mmol/L (ref 101–111)
Creatinine, Ser: 0.94 mg/dL (ref 0.44–1.00)
GFR calc non Af Amer: >60 mL/min (ref >60)
Glucose, Bld: 117 mg/dL — ABNORMAL HIGH (ref 65–99)
Potassium: 3.5 mmol/L (ref 3.5–5.1)
Sodium: 138 mmol/L (ref 135–145)

## 2015-03-22 LAB — TROPONIN I: Troponin I: <0.03 ng/mL (ref <0.031)

## 2015-03-22 IMAGING — CR DG CHEST 1V
1 series · 1 of 1 positions shown · non-contrast
Comparison: Chest radiograph [DATE]

CLINICAL DATA: LEFT chest pressure, intermittent shortness of
breath and nausea. Heartburn for many weeks. History of
hypertension, myocardial infarction, hyperlipidemia, tobacco use.

EXAM:
CHEST  1 VIEW

[ap portable]
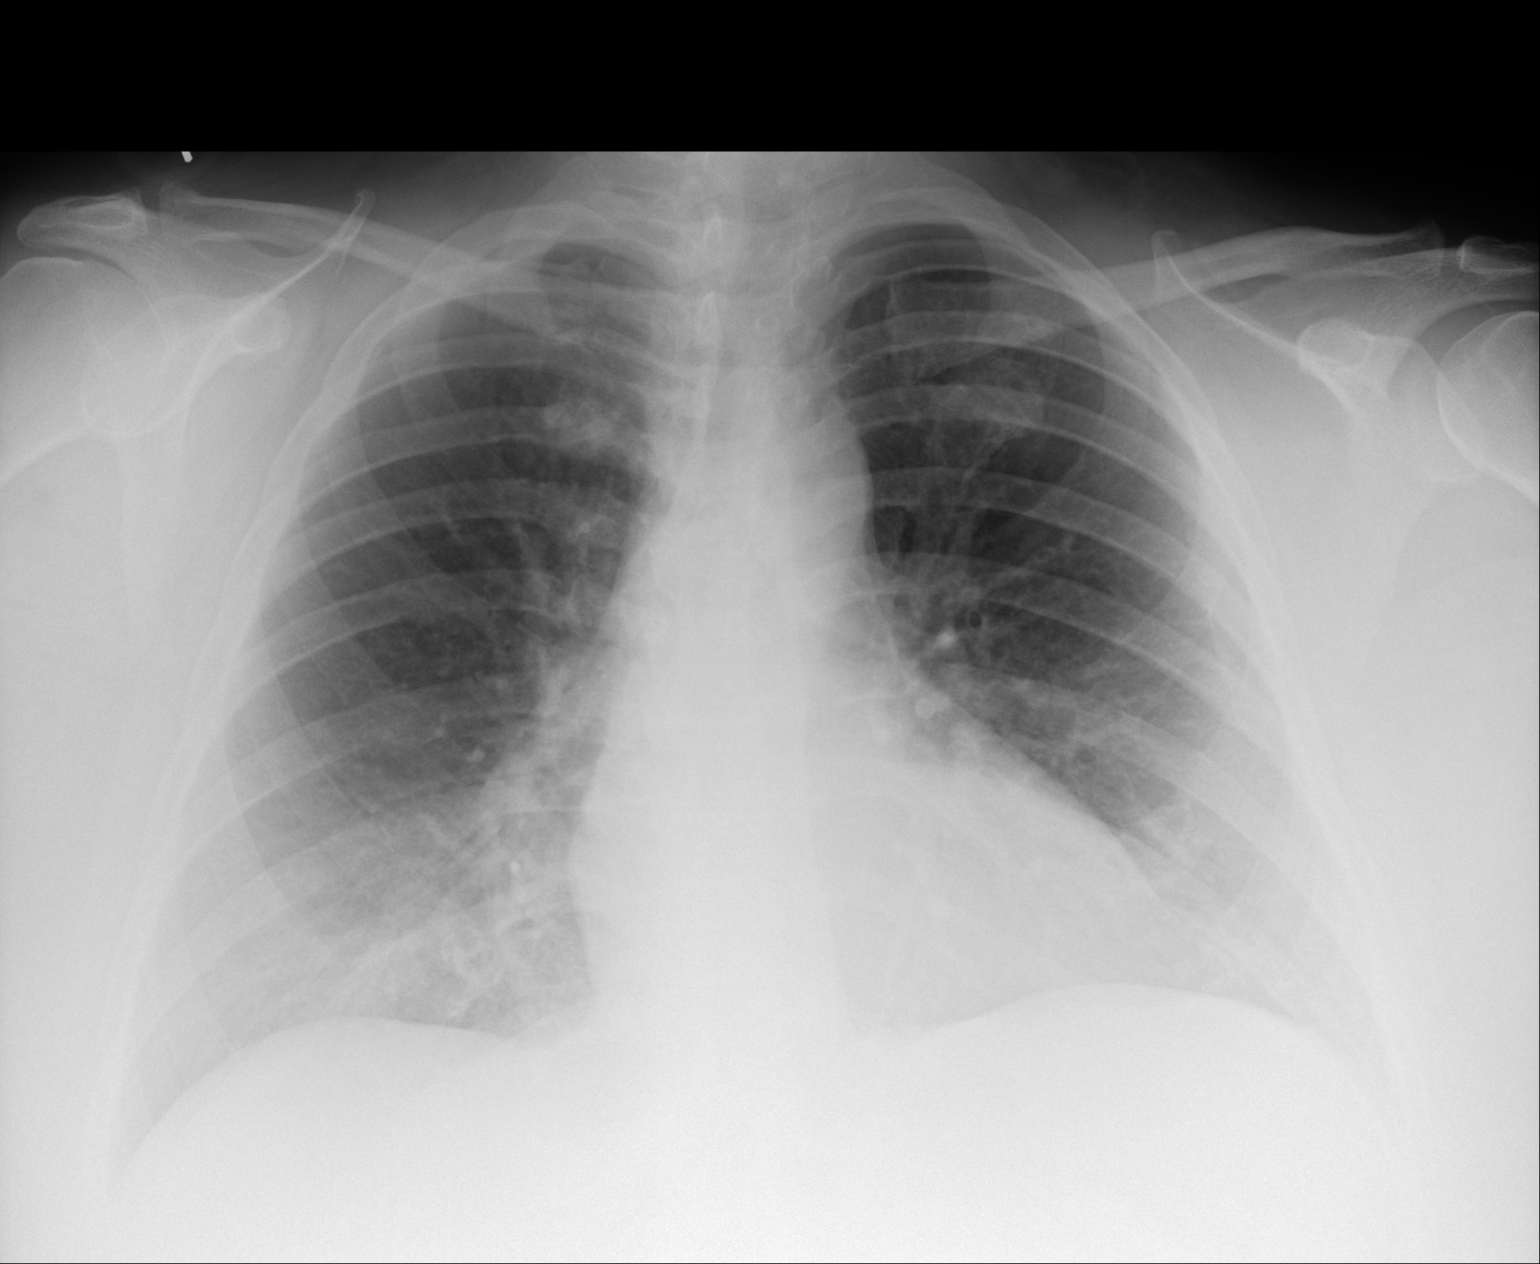

[1 of 1 positions shown; findings below may reference images not displayed]

FINDINGS: Cardiomediastinal silhouette is unremarkable. The lungs are clear
without pleural effusions or focal consolidations. Trachea projects
midline and there is no pneumothorax. Soft tissue planes and
included osseous structures are non-suspicious.
IMPRESSION: Normal portable chest radiograph.

## 2015-03-22 NOTE — ED Notes (Signed)
Pt states "I've been under a lot of stress lately and feel kind of funny today".  States "it's almost like when I had a heart attack in 2014".  Reporting pressure in left side of chest, occasional SOB and nausea.  Reporting bloating and heartburn sensations.  No distress noted at present time.

## 2015-03-22 NOTE — ED Provider Notes (Signed)
CSN: [id number]     Arrival date & time 03/22/15  2118 History  This chart was scribed for Shannon Schmidt, MD by Rayfield Citizen, ED Scribe. This patient was seen in room APA11/APA11 and the patient's care was started at 12:01 AM.    Chief Complaint  Patient presents with  . Chest Pain   The history is provided by the patient. No language interpreter was used.     HPI Comments: Shannon [name] is a 49 y.o. female with past medical history of HTN, HLD, CAD, MI (2014, PCI to circumflex), anxiety who presents to the Emergency Department complaining of central chest pain, described as a constant heaviness. Transient nausea, none now. No SOB now. Prior MI had discomfort radiating into her neck and jaw, which she denies at this time. Reports generalized malaise for several months. Denies exertional CP. No fevers or chills or productive cough. No unilateral leg swelling. Reports feeling "bloated". Reports "stressful" day. Continues to smoke tobacco.   She also notes some intermittent numbness and shooting pains to the lateral aspect of her right upper leg; she describes it as a "burning pain." This pain does not radiate into her buttock or down her leg.   Cardiologist Dr. Sherren Mocha. She is no longer on Plavix but takes aspirin 81mg  daily (last dose today around 15:00). No stress tests in the past year. She is a 1.5ppd smoker.   Past Medical History  Diagnosis Date  . Anxiety   . Depression   . Hypertension   . Hyperlipidemia   . Coronary artery disease     LHC 11/25/12 90% stenosis mid LCx & otherwise nonobstructive dz w/ EF 60-65% S/p PTCA/DES to LCx  . Tobacco abuse   . MI (myocardial infarction)    Past Surgical History  Procedure Laterality Date  . Tonsillectomy    . Uterus ablation    . Coronary angioplasty with stent placement    . Left heart catheterization with coronary angiogram N/A 11/15/2012    Procedure: LEFT HEART CATHETERIZATION WITH CORONARY ANGIOGRAM;  Surgeon: Thayer Headings,  MD;  Location[facility] CATH LAB;  Service: Cardiovascular;  Laterality: N/A;  . Percutaneous coronary stent intervention (pci-s)  11/15/2012    Procedure: PERCUTANEOUS CORONARY STENT INTERVENTION (PCI-S);  Surgeon: Thayer Headings, MD;  Location: [facility] CATH LAB;  Service: Cardiovascular;;   Family History  Problem Relation Age of Onset  . Depression Mother   . Anxiety disorder Maternal Grandmother   . Anxiety disorder Paternal Grandmother   . OCD Paternal Grandmother   . Depression Paternal Grandmother    History  Substance Use Topics  . Smoking status: Current Every Day Smoker -- 1.50 packs/day    Types: Cigarettes  . Smokeless tobacco: Not on file  . Alcohol Use: No   OB History    No data available     Review of Systems  A complete 10 system review of systems was obtained and all systems are negative except as noted in the HPI and PMH.    Allergies  Morphine and related  Home Medications   Prior to Admission medications   Medication Sig Start Date End Date Taking? Authorizing Provider  ALPRAZolam Duanne Moron) 1 MG tablet Take 1 mg by mouth 2 (two) times daily as needed for anxiety.     Historical Provider, MD  aspirin 81 MG tablet Take 81 mg by mouth daily.    Historical Provider, MD  escitalopram (LEXAPRO) 20 MG tablet Take 1 tablet (20 mg total)  by mouth daily. 02/02/14 02/02/15  Leonides Grills, MD  ibuprofen (ADVIL,MOTRIN) 200 MG tablet Take 600 mg by mouth 2 (two) times daily as needed for pain.    Historical Provider, MD  nitroGLYCERIN (NITROSTAT) 0.4 MG SL tablet Place 1 tablet (0.4 mg total) under the tongue every 5 (five) minutes as needed for chest pain (up to 3 doses). 11/16/12   Jessica A Hope, PA-C   BP 126/77 mmHg  Pulse 71  Temp(Src) 97.8 F (36.6 C) (Oral)  Resp 18  Ht 5\' 5"  (1.651 m)  Wt 250 lb (113.399 kg)  BMI 41.60 kg/m2  SpO2 100% Physical Exam  Constitutional: She is oriented to person, place, and time. She appears well-developed and well-nourished. No  distress.  HENT:  Head: Normocephalic and atraumatic.  Eyes: EOM are normal.  Neck: Normal range of motion.  Cardiovascular: Normal rate, regular rhythm and normal heart sounds.   Pulmonary/Chest: Effort normal and breath sounds normal.  Abdominal: Soft. She exhibits no distension. There is no tenderness.  Musculoskeletal: Normal range of motion.  Neurological: She is alert and oriented to person, place, and time.  Skin: Skin is warm and dry.  Psychiatric: She has a normal mood and affect. Judgment normal.  Nursing note and vitals reviewed.   ED Course  Procedures   DIAGNOSTIC STUDIES: Oxygen Saturation is 100% on RA, normal by my interpretation.    COORDINATION OF CARE: 12:13 AM Discussed treatment plan with pt at bedside and pt agreed to plan.   Labs Review Labs Reviewed  CBC WITH DIFFERENTIAL/PLATELET - Abnormal; Notable for the following:    Lymphs Abs 4.5 (*)    All other components within normal limits  BASIC METABOLIC PANEL - Abnormal; Notable for the following:    Glucose, Bld 117 (*)    Calcium 8.7 (*)    All other components within normal limits  TROPONIN I  TROPONIN I    Imaging Review Dg Chest 1 View  03/23/2015   CLINICAL DATA:  LEFT chest pressure, intermittent shortness of breath and nausea. Heartburn for many weeks. History of hypertension, myocardial infarction, hyperlipidemia, tobacco use.  EXAM: CHEST  1 VIEW  COMPARISON:  Chest radiograph July 06, 2014  FINDINGS: Cardiomediastinal silhouette is unremarkable. The lungs are clear without pleural effusions or focal consolidations. Trachea projects midline and there is no pneumothorax. Soft tissue planes and included osseous structures are non-suspicious.  IMPRESSION: Normal portable chest radiograph.   Electronically Signed   By: Elon Alas M.D.   On: 03/23/2015 00:18  I personally reviewed the imaging tests through PACS system I reviewed available ER/hospitalization records through the EMR   EKG  Interpretation #1 Date/Time:  Thursday March 22 2015 21:25:31 EDT Ventricular Rate:  82 PR Interval:  150 QRS Duration: 84 QT Interval:  394 QTC Calculation: 460 R Axis:   62 Text Interpretation:  Normal sinus rhythm Normal ECG No significant change  was found Confirmed by Merrily Tegeler  MD, Lennette Bihari ([id number]) on 03/23/2015 12:05:15 AM  ECG interpretation #2  Date: 03/23/2015  Rate: 71  Rhythm: normal sinus rhythm  QRS Axis: normal  Intervals: normal  ST/T Wave abnormalities: normal  Conduction Disutrbances: none  Narrative Interpretation:   Old EKG Reviewed: No significant changes noted          MDM   Final diagnoses:  Chest pain, unspecified chest pain type  Tobacco abuse    Constant chest heaviness for nearly 9 hrs. Refused GI meds. ecg x 2 normal and troponin  x 2 normal. Low suspicion for ACS. Will need outpatient cards evaluation given her hx of PCI and will probably benefit from noninvasive testing. I don't think the pt needs hospitalization tonight. i asked that she call the cardiology office in the morning for close follow up. It was recommended that she return to ER for new or worsening symptoms   I personally performed the services described in this documentation, which was scribed in my presence. The recorded information has been reviewed and is accurate.       Shannon Schmidt, MD 03/23/15 [phone]

## 2015-03-23 LAB — TROPONIN I

## 2015-03-23 MED ORDER — GI COCKTAIL ~~LOC~~: 30.0000 mL | Freq: Once | ORAL | Status: DC

## 2015-03-23 MED ORDER — ASPIRIN 325 MG PO TABS
325.0000 mg | ORAL_TABLET | Freq: Once | ORAL | Status: AC
  Administered 2015-03-23: 325 mg via ORAL
  Filled 2015-03-23: qty 1

## 2015-03-23 MED ORDER — HYDROMORPHONE HCL 1 MG/ML IJ SOLN: 1.0000 mg | Freq: Once | INTRAMUSCULAR | Status: DC

## 2015-03-23 NOTE — Discharge Instructions (Signed)
Chest Pain (Nonspecific) °It is often hard to give a specific diagnosis for the cause of chest pain. There is always a chance that your pain could be related to something serious, such as a heart attack or a blood clot in the lungs. You need to follow up with your health care provider for further evaluation. °CAUSES  °· Heartburn. °· Pneumonia or bronchitis. °· Anxiety or stress. °· Inflammation around your heart (pericarditis) or lung (pleuritis or pleurisy). °· A blood clot in the lung. °· A collapsed lung (pneumothorax). It can develop suddenly on its own (spontaneous pneumothorax) or from trauma to the chest. °· Shingles infection (herpes zoster virus). °The chest wall is composed of bones, muscles, and cartilage. Any of these can be the source of the pain. °· The bones can be bruised by injury. °· The muscles or cartilage can be strained by coughing or overwork. °· The cartilage can be affected by inflammation and become sore (costochondritis). °DIAGNOSIS  °Lab tests or other studies may be needed to find the cause of your pain. Your health care provider may have you take a test called an ambulatory electrocardiogram (ECG). An ECG records your heartbeat patterns over a 24-hour period. You may also have other tests, such as: °· Transthoracic echocardiogram (TTE). During echocardiography, sound waves are used to evaluate how blood flows through your heart. °· Transesophageal echocardiogram (TEE). °· Cardiac monitoring. This allows your health care provider to monitor your heart rate and rhythm in real time. °· Holter monitor. This is a portable device that records your heartbeat and can help diagnose heart arrhythmias. It allows your health care provider to track your heart activity for several days, if needed. °· Stress tests by exercise or by giving medicine that makes the heart beat faster. °TREATMENT  °· Treatment depends on what may be causing your chest pain. Treatment may include: °¨ Acid blockers for  heartburn. °¨ Anti-inflammatory medicine. °¨ Pain medicine for inflammatory conditions. °¨ Antibiotics if an infection is present. °· You may be advised to change lifestyle habits. This includes stopping smoking and avoiding alcohol, caffeine, and chocolate. °· You may be advised to keep your head raised (elevated) when sleeping. This reduces the chance of acid going backward from your stomach into your esophagus. °Most of the time, nonspecific chest pain will improve within 2-3 days with rest and mild pain medicine.  °HOME CARE INSTRUCTIONS  °· If antibiotics were prescribed, take them as directed. Finish them even if you start to feel better. °· For the next few days, avoid physical activities that bring on chest pain. Continue physical activities as directed. °· Do not use any tobacco products, including cigarettes, chewing tobacco, or electronic cigarettes. °· Avoid drinking alcohol. °· Only take medicine as directed by your health care provider. °· Follow your health care provider's suggestions for further testing if your chest pain does not go away. °· Keep any follow-up appointments you made. If you do not go to an appointment, you could develop lasting (chronic) problems with pain. If there is any problem keeping an appointment, call to reschedule. °SEEK MEDICAL CARE IF:  °· Your chest pain does not go away, even after treatment. °· You have a rash with blisters on your chest. °· You have a fever. °SEEK IMMEDIATE MEDICAL CARE IF:  °· You have increased chest pain or pain that spreads to your arm, neck, jaw, back, or abdomen. °· You have shortness of breath. °· You have an increasing cough, or you cough   up blood.  You have severe back or abdominal pain.  You feel nauseous or vomit.  You have severe weakness.  You faint.  You have chills. This is an emergency. Do not wait to see if the pain will go away. Get medical help at once. Call your local emergency services (911 in [place] Do not drive  yourself to the hospital. MAKE SURE YOU:   Understand these instructions.  Will watch your condition.  Will get help right away if you are not doing well or get worse. Document Released: 06/25/2005 Document Revised: 09/20/2013 Document Reviewed: 04/20/2008 [facility] Patient Information 2015 [place], [place]. This information is not intended to replace advice given to you by your health care provider. Make sure you discuss any questions you have with your health care provider.   Smoking Cessation, Tips for Success If you are ready to quit smoking, congratulations! You have chosen to help yourself be healthier. Cigarettes bring nicotine, tar, carbon monoxide, and other irritants into your body. Your lungs, heart, and blood vessels will be able to work better without these poisons. There are many different ways to quit smoking. Nicotine gum, nicotine patches, a nicotine inhaler, or nicotine nasal spray can help with physical craving. Hypnosis, support groups, and medicines help break the habit of smoking. WHAT THINGS CAN I DO TO MAKE QUITTING EASIER?  Here are some tips to help you quit for good:  Pick a date when you will quit smoking completely. Tell all of your friends and family about your plan to quit on that date.  Do not try to slowly cut down on the number of cigarettes you are smoking. Pick a quit date and quit smoking completely starting on that day.  Throw away all cigarettes.   Clean and remove all ashtrays from your home, work, and car.  On a card, write down your reasons for quitting. Carry the card with you and read it when you get the urge to smoke.  Cleanse your body of nicotine. Drink enough water and fluids to keep your urine clear or pale yellow. Do this after quitting to flush the nicotine from your body.  Learn to predict your moods. Do not let a bad situation be your excuse to have a cigarette. Some situations in your life might tempt you into wanting a  cigarette.  Never have "just one" cigarette. It leads to wanting another and another. Remind yourself of your decision to quit.  Change habits associated with smoking. If you smoked while driving or when feeling stressed, try other activities to replace smoking. Stand up when drinking your coffee. Brush your teeth after eating. Sit in a different chair when you read the paper. Avoid alcohol while trying to quit, and try to drink fewer caffeinated beverages. Alcohol and caffeine may urge you to smoke.  Avoid foods and drinks that can trigger a desire to smoke, such as sugary or spicy foods and alcohol.  Ask people who smoke not to smoke around you.  Have something planned to do right after eating or having a cup of coffee. For example, plan to take a walk or exercise.  Try a relaxation exercise to calm you down and decrease your stress. Remember, you may be tense and nervous for the first 2 weeks after you quit, but this will pass.  Find new activities to keep your hands busy. Play with a pen, coin, or rubber band. Doodle or draw things on paper.  Brush your teeth right after eating. This will  help cut down on the craving for the taste of tobacco after meals. You can also try mouthwash.   Use oral substitutes in place of cigarettes. Try using lemon drops, carrots, cinnamon sticks, or chewing gum. Keep them handy so they are available when you have the urge to smoke.  When you have the urge to smoke, try deep breathing.  Designate your home as a nonsmoking area.  If you are a heavy smoker, ask your health care provider about a prescription for nicotine chewing gum. It can ease your withdrawal from nicotine.  Reward yourself. Set aside the cigarette money you save and buy yourself something nice.  Look for support from others. Join a support group or smoking cessation program. Ask someone at home or at work to help you with your plan to quit smoking.  Always ask yourself, "Do I need this  cigarette or is this just a reflex?" Tell yourself, "Today, I choose not to smoke," or "I do not want to smoke." You are reminding yourself of your decision to quit.  Do not replace cigarette smoking with electronic cigarettes (commonly called e-cigarettes). The safety of e-cigarettes is unknown, and some may contain harmful chemicals.  If you relapse, do not give up! Plan ahead and think about what you will do the next time you get the urge to smoke. HOW WILL I FEEL WHEN I QUIT SMOKING? You may have symptoms of withdrawal because your body is used to nicotine (the addictive substance in cigarettes). You may crave cigarettes, be irritable, feel very hungry, cough often, get headaches, or have difficulty concentrating. The withdrawal symptoms are only temporary. They are strongest when you first quit but will go away within 10-14 days. When withdrawal symptoms occur, stay in control. Think about your reasons for quitting. Remind yourself that these are signs that your body is healing and getting used to being without cigarettes. Remember that withdrawal symptoms are easier to treat than the major diseases that smoking can cause.  Even after the withdrawal is over, expect periodic urges to smoke. However, these cravings are generally short lived and will go away whether you smoke or not. Do not smoke! WHAT RESOURCES ARE AVAILABLE TO HELP ME QUIT SMOKING? Your health care provider can direct you to community resources or hospitals for support, which may include:  Group support.  Education.  Hypnosis.  Therapy. Document Released: 06/13/2004 Document Revised: 01/30/2014 Document Reviewed: 03/03/2013 [facility] Patient Information 2015 [place], [place]. This information is not intended to replace advice given to you by your health care provider. Make sure you discuss any questions you have with your health care provider.

## 2015-03-23 NOTE — ED Notes (Signed)
Pt refused to take dilaudid and gi cocktail though she is having chest pressure and heaviness. EDP aware.

## 2015-03-23 NOTE — ED Notes (Signed)
   03/22/15 2330  Chest Pain Assessment  Occurrence Today  Chronicity New  Chest Pain Location Central chest  Pain Descriptors / Indicators Heaviness;Pressure  Pt c/o chest pressure and heaviness. She also states she is anxious

## 2015-03-23 NOTE — ED Notes (Signed)
   03/22/15 2330  Cardiac  Cardiac (WDL) X  Cardiac Regularity Regular  Cardiac Rhythm NSR  Bedside Cardiac Monitor On Yes  Bedside Cardiac Audible Yes  Bedside Cardiac Alarms Set Yes

## 2015-03-28 ENCOUNTER — Ambulatory Visit (INDEPENDENT_AMBULATORY_CARE_PROVIDER_SITE_OTHER): Payer: 59 | Admitting: Cardiovascular Disease

## 2015-03-28 ENCOUNTER — Encounter: Payer: Self-pay | Admitting: Cardiovascular Disease

## 2015-03-28 ENCOUNTER — Encounter: Payer: Self-pay | Admitting: *Deleted

## 2015-03-28 VITALS — BP 122/90 | HR 78 | Ht 65.0 in | Wt 255.0 lb

## 2015-03-28 DIAGNOSIS — E785 Hyperlipidemia, unspecified: Secondary | ICD-10-CM | POA: Diagnosis not present

## 2015-03-28 DIAGNOSIS — I10 Essential (primary) hypertension: Secondary | ICD-10-CM

## 2015-03-28 DIAGNOSIS — R079 Chest pain, unspecified: Secondary | ICD-10-CM | POA: Diagnosis not present

## 2015-03-28 DIAGNOSIS — Z87898 Personal history of other specified conditions: Secondary | ICD-10-CM

## 2015-03-28 DIAGNOSIS — I25118 Atherosclerotic heart disease of native coronary artery with other forms of angina pectoris: Secondary | ICD-10-CM

## 2015-03-28 DIAGNOSIS — Z9289 Personal history of other medical treatment: Secondary | ICD-10-CM

## 2015-03-28 DIAGNOSIS — I6523 Occlusion and stenosis of bilateral carotid arteries: Secondary | ICD-10-CM

## 2015-03-28 MED ORDER — METOPROLOL SUCCINATE ER 25 MG PO TB24: 12.5000 mg | ORAL_TABLET | Freq: Every day | ORAL | Status: DC
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
## 2015-03-28 NOTE — Patient Instructions (Addendum)
   Your physician recommends that you schedule a follow-up appointment in: [place]  Your physician has recommended you make the following change in your medication:   START METOPROLOL 12.5 MG DAILY   CONTINUE ALL OTHER MEDICATIONS AS DIRECTED   Your physician has requested that you have en exercise stress myoview. For further information please visit [email or web address]. Please follow instruction sheet, as give. Office will contact with results via phone or letter.

## 2015-03-30 ENCOUNTER — Encounter: Payer: Self-pay | Admitting: Cardiovascular Disease

## 2015-04-06 ENCOUNTER — Other Ambulatory Visit: Payer: Self-pay | Admitting: *Deleted

## 2015-04-06 DIAGNOSIS — I6522 Occlusion and stenosis of left carotid artery: Secondary | ICD-10-CM

## 2015-04-10 ENCOUNTER — Ambulatory Visit: Payer: Self-pay | Admitting: Cardiovascular Disease

## 2015-04-18 ENCOUNTER — Inpatient Hospital Stay (HOSPITAL_COMMUNITY): Admission: RE | Admit: 2015-04-18 | Payer: Self-pay | Source: Ambulatory Visit

## 2015-04-18 ENCOUNTER — Encounter (HOSPITAL_COMMUNITY): Payer: Commercial Managed Care - PPO

## 2015-04-18 ENCOUNTER — Encounter (HOSPITAL_COMMUNITY): Admission: RE | Admit: 2015-04-18 | Payer: Commercial Managed Care - PPO | Source: Ambulatory Visit

## 2015-04-18 ENCOUNTER — Telehealth: Payer: Self-pay | Admitting: Cardiovascular Disease

## 2015-04-18 NOTE — Telephone Encounter (Signed)
Left message for pt that we received authorization from pt's insurance [employer].  Also left my direct phone# if pt would like to apply/see if she will qualify for [facility] financial aid.

## 2015-04-18 NOTE — Telephone Encounter (Signed)
Tried to cancel 05-08-15 carotid but appt is linked to Dr. Evelena Leyden consult.  Notified [email or web address]  office.  She will contact Dr. Evelena Leyden office about carotid and consult and have them canx the carotid.

## 2015-04-18 NOTE — Telephone Encounter (Signed)
Pt scheduled for myoview for [email or web address].  Pt's new insurance [employer] effective 03-30-15. Preservice center was told precert was required and they notified [place] of this. Passport Eligibility stated showed no precert required.  [employer] Benefits fax stated "outpt" procedures required precert.  [employer] was called several times by myself and my coworker Dorian Pod.  We were told each time no precert for CPT code 25366. On 04-17-15, the second time I called [employer], I spoke with "Cristie Hem D" who told me no precert required for CPT code [phone].  I then asked for a reference# for the phone call and at that time he then asked me if this was an advanced radiology procedure.  I stated to him that it was.  He then told me "yes" it needed precert.  I attempted to reach the [facility] management nurse to get the case approved as it was the next day.  I was unable to reach the nurse but did leave a [phone] with all case information and faxed clinicals "STAT".  As of 8:30 this morning, clinicals have not been received/loaded ito their system.  I left another voicemail this morning for the nurse, Ellie E.  I contacted pt this morning and explained all this to her.  She wants nuclear test cancelled for today.  She doesn't want to have test unless authorization is on file and really doesn't feel she can afford it.  I also informed her of her carotid and Dr. Kellie Simmering appts.  At first she wanted both those cancelled too but I advised we should cancel just the carotid but keep Dr. Evelena Leyden appt.  Pt agrees and when Josem Kaufmann is received for the nuc, I will call her back and talk with her further about keeping her appts.

## 2015-04-27 ENCOUNTER — Encounter: Payer: Self-pay | Admitting: *Deleted

## 2015-05-07 ENCOUNTER — Encounter: Payer: Self-pay | Admitting: Vascular Surgery

## 2015-05-08 ENCOUNTER — Encounter (HOSPITAL_COMMUNITY): Payer: Self-pay

## 2015-05-08 ENCOUNTER — Encounter: Payer: Self-pay | Admitting: Vascular Surgery

## 2015-06-19 ENCOUNTER — Telehealth: Payer: Self-pay | Admitting: Cardiovascular Disease

## 2015-06-19 DIAGNOSIS — R079 Chest pain, unspecified: Secondary | ICD-10-CM

## 2015-06-19 NOTE — Telephone Encounter (Signed)
[name] called stating that she had cancelled her stress test due to insurance issues.  Also, patient states that she was never Told about being referred to a vascular surgeon . Shannon [name] wants to know what is more important the vascular testing or The stress test. She can not afford all these tests at this time. She has an upcoming appointment next week . [phone].

## 2015-06-19 NOTE — Telephone Encounter (Addendum)
Discussed below with patient.  If stress test is more important, can she do exercise test.  Dictation states Lexiscan.  Patient stated Dr. Raliegh Ip told her Exercise with dye would be fine.  Patient stated that she is willing to do the one doctor feels is most important.  States with her new insurance she will be having to pay most of this out of pocket any way.  3 month f/u scheduled for 06/26/2015 with Dr. Bronson Ing.

## 2015-06-20 NOTE — Telephone Encounter (Signed)
Will forward to Dr Bronson Ing as he returns tomorrow   Zandra Abts MD

## 2015-06-26 ENCOUNTER — Ambulatory Visit: Payer: Self-pay | Admitting: Cardiovascular Disease

## 2015-06-26 NOTE — Telephone Encounter (Signed)
Can hold off on

## 2015-06-26 NOTE — Telephone Encounter (Signed)
Left message to return call 

## 2015-06-26 NOTE — Telephone Encounter (Signed)
As per my note, carotid Dopplers are to be repeated in April 2017, not now. She had chest pain and has CAD with prior stenting. I do not feel she would be able to provide an adequate effort on a treadmill to get necessary information, which is why I ordered a [place].

## 2015-06-27 NOTE — Telephone Encounter (Signed)
That would be fine 

## 2015-06-27 NOTE — Telephone Encounter (Signed)
Call returned to patient  Notified of Dr. Court Joy response.  Stated that she prefers to do the Exercise Cardiolite instead of the [place].  Stated that she is not trying to be difficult, but she has a lot of stress and anxiety issues.  She is concerned that the normal side effects of the Lexiscan may be too much for her.  Informed patient that message will be sent to provider for further advice.

## 2015-06-27 NOTE — Telephone Encounter (Signed)
Patient returned call. Hours that she is available for someone to call back are listed below 10:00 am - 1:15 pm then anytime after 2:30pm

## 2015-06-28 NOTE — Telephone Encounter (Signed)
Left message to return call 

## 2015-06-30 DIAGNOSIS — Z9289 Personal history of other medical treatment: Secondary | ICD-10-CM

## 2015-06-30 HISTORY — DX: Personal history of other medical treatment: Z92.89

## 2015-07-02 NOTE — Telephone Encounter (Signed)
2nd attempt to contact patient - left information below on voice mail.  Will go ahead & put order in & forward to scheduling.    She will need to hold her Metoprolol the morning of test only.  Also, nothing to eat/drink for 4 hours prior to test & no caffeine for 24 hours.

## 2015-07-03 ENCOUNTER — Encounter: Payer: Self-pay | Admitting: *Deleted

## 2015-07-06 ENCOUNTER — Telehealth: Payer: Self-pay | Admitting: Cardiovascular Disease

## 2015-07-06 NOTE — Telephone Encounter (Signed)
Mrs. Labombard has questions about her upcoming stress test. Set for 07-11-15

## 2015-07-09 NOTE — Telephone Encounter (Signed)
Discussed exercise cardiolite test with patient.  Instructions given to have nothing to eat/drink x 4 hours prior to test & no caffeine x 24 hours prior.  Also, hold Metoprolol the morning of test only.  Patient verbalized understanding.

## 2015-07-11 ENCOUNTER — Encounter (HOSPITAL_COMMUNITY): Payer: Commercial Managed Care - PPO

## 2015-07-11 ENCOUNTER — Encounter (HOSPITAL_COMMUNITY): Payer: Self-pay

## 2015-07-25 ENCOUNTER — Encounter (HOSPITAL_COMMUNITY)
Admission: RE | Admit: 2015-07-25 | Discharge: 2015-07-25 | Disposition: A | Discharge status: Home / Self Care | Payer: Commercial Managed Care - PPO | Source: Ambulatory Visit | Attending: Cardiovascular Disease | Admitting: Cardiovascular Disease

## 2015-07-25 ENCOUNTER — Inpatient Hospital Stay (HOSPITAL_COMMUNITY): Admission: RE | Admit: 2015-07-25 | Payer: Self-pay | Source: Ambulatory Visit

## 2015-07-25 ENCOUNTER — Encounter (HOSPITAL_COMMUNITY): Payer: Self-pay

## 2015-07-25 DIAGNOSIS — R079 Chest pain, unspecified: Secondary | ICD-10-CM | POA: Insufficient documentation

## 2015-07-25 LAB — NM MYOCAR MULTI W/SPECT W/WALL MOTION / EF
CHL CUP NUCLEAR SDS: 0
CHL CUP RESTING HR STRESS: 72 {beats}/min
CHL RATE OF PERCEIVED EXERTION: 15
CSEPED: 7 min
CSEPEDS: 20 s
CSEPEW: 8.6 METS
CSEPPHR: 148 {beats}/min
LV dias vol: 63 mL
LVSYSVOL: 28 mL
MPHR: 169 {beats}/min
Percent HR: 87 %
RATE: 0.14
SRS: 0
SSS: 0
TID: 0.73

## 2015-07-25 IMAGING — NM NM MYOCAR MULTI W/SPECT W/WALL MOTION & EF
2 series · 12 of 12 positions shown · non-contrast
Comparison: none

[Series 1: rest · 8.28mm/px · 6 of 64 frames shown]
[frame 6/64]
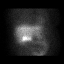
[frame 16/64]
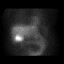
[frame 27/64]
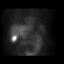
[frame 38/64]
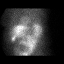
[frame 48/64]
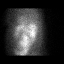
[frame 59/64]
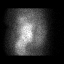

[Series 2: stress gated · 8.28mm/px · 6 of 64 frames shown]
[frame 6/64]
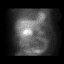
[frame 16/64]
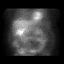
[frame 27/64]
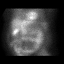
[frame 38/64]
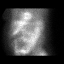
[frame 48/64]
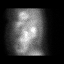
[frame 59/64]
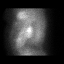

[12 of 12 positions shown; findings below may reference images not displayed]

Canned report from images found in remote index.

Refer to host system for actual result text.

## 2015-07-25 MED ORDER — TECHNETIUM TC 99M SESTAMIBI GENERIC - CARDIOLITE
10.0000 | Freq: Once | INTRAVENOUS | Status: AC | PRN
  Administered 2015-07-25: 10.7 via INTRAVENOUS

## 2015-07-25 MED ORDER — SODIUM CHLORIDE 0.9 % IJ SOLN
INTRAMUSCULAR | Status: AC
  Administered 2015-07-25: 10 mL via INTRAVENOUS
  Filled 2015-07-25: qty 3

## 2015-07-25 MED ORDER — TECHNETIUM TC 99M SESTAMIBI - CARDIOLITE
30.0000 | Freq: Once | INTRAVENOUS | Status: AC | PRN
  Administered 2015-07-25: 33 via INTRAVENOUS

## 2015-07-25 MED ORDER — REGADENOSON 0.4 MG/5ML IV SOLN
INTRAVENOUS | Status: AC
  Filled 2015-07-25: qty 5

## 2015-07-31 ENCOUNTER — Telehealth: Payer: Self-pay | Admitting: Cardiovascular Disease

## 2015-07-31 NOTE — Telephone Encounter (Signed)
Shannon [name] called in reference to her appointment for Wednesday.  She states that it will cost her $60.00 to come in to get test results. She wants to check with Dr. Bronson Ing to see if she can cancel this appointment due to the cost of her copay.

## 2015-08-01 ENCOUNTER — Ambulatory Visit: Payer: Self-pay | Admitting: Cardiovascular Disease

## 2015-08-01 NOTE — Telephone Encounter (Signed)
Patient informed. 

## 2015-08-01 NOTE — Telephone Encounter (Signed)
Inform pt that stress test was normal. You may schedule a 6 month f/u visit for her.

## 2015-10-09 ENCOUNTER — Ambulatory Visit: Payer: Self-pay | Admitting: Neurology

## 2015-11-15 ENCOUNTER — Ambulatory Visit (INDEPENDENT_AMBULATORY_CARE_PROVIDER_SITE_OTHER): Payer: Commercial Managed Care - PPO | Admitting: Neurology

## 2015-11-15 ENCOUNTER — Encounter: Payer: Self-pay | Admitting: Neurology

## 2015-11-15 VITALS — BP 110/80 | HR 92 | Ht 66.0 in | Wt 263.1 lb

## 2015-11-15 DIAGNOSIS — R202 Paresthesia of skin: Secondary | ICD-10-CM | POA: Diagnosis not present

## 2015-11-15 DIAGNOSIS — G5711 Meralgia paresthetica, right lower limb: Secondary | ICD-10-CM

## 2015-11-15 DIAGNOSIS — F17200 Nicotine dependence, unspecified, uncomplicated: Secondary | ICD-10-CM
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
## 2015-11-15 NOTE — Progress Notes (Signed)
Note routed

## 2015-11-15 NOTE — Patient Instructions (Addendum)
1.  You have meralgia paresthetica causing your thigh numbness  - Continue your exercises as weight loss may help improve thigh numbness 2.  Neck physiotherapy 3.  Nerve conduction study and electromyography of the arms 4.  Encouraged to quit smoking  Return to clinic in 3 months

## 2015-11-26 ENCOUNTER — Telehealth: Payer: Self-pay | Admitting: Neurology

## 2015-11-26 NOTE — Telephone Encounter (Signed)
Pt has not heard from [place] yet please call her at [phone]

## 2015-11-27 ENCOUNTER — Encounter: Payer: Self-pay | Admitting: Neurology

## 2015-11-27 NOTE — Telephone Encounter (Signed)
Called [facility] and left message for them to call me back.

## 2015-11-27 NOTE — Telephone Encounter (Signed)
Left message for patient that I have called [facility] but they have not called me back yet.

## 2015-11-28 NOTE — Telephone Encounter (Signed)
[facility] called and said that they have been trying to get in touch with patient.  They have left messages but patient has not called them back.  They said that they would keep trying to reach her.

## 2015-12-11 ENCOUNTER — Encounter: Payer: Self-pay | Admitting: Neurology

## 2015-12-20 ENCOUNTER — Emergency Department (HOSPITAL_COMMUNITY)
Admission: EM | Admit: 2015-12-20 | Discharge: 2015-12-21 | Disposition: A | Discharge status: Home / Self Care | Payer: Commercial Managed Care - PPO | Attending: Emergency Medicine | Admitting: Emergency Medicine

## 2015-12-20 ENCOUNTER — Encounter (HOSPITAL_COMMUNITY): Payer: Self-pay

## 2015-12-20 DIAGNOSIS — F329 Major depressive disorder, single episode, unspecified: Secondary | ICD-10-CM | POA: Insufficient documentation

## 2015-12-20 DIAGNOSIS — F17210 Nicotine dependence, cigarettes, uncomplicated: Secondary | ICD-10-CM | POA: Insufficient documentation

## 2015-12-20 DIAGNOSIS — I2510 Atherosclerotic heart disease of native coronary artery without angina pectoris: Secondary | ICD-10-CM | POA: Insufficient documentation

## 2015-12-20 DIAGNOSIS — Z79899 Other long term (current) drug therapy: Secondary | ICD-10-CM | POA: Diagnosis not present

## 2015-12-20 DIAGNOSIS — T7840XA Allergy, unspecified, initial encounter: Secondary | ICD-10-CM | POA: Diagnosis present

## 2015-12-20 DIAGNOSIS — I10 Essential (primary) hypertension: Secondary | ICD-10-CM | POA: Diagnosis not present

## 2015-12-20 DIAGNOSIS — F419 Anxiety disorder, unspecified: Secondary | ICD-10-CM

## 2015-12-20 DIAGNOSIS — E785 Hyperlipidemia, unspecified: Secondary | ICD-10-CM | POA: Diagnosis not present

## 2015-12-20 DIAGNOSIS — Z7982 Long term (current) use of aspirin: Secondary | ICD-10-CM | POA: Diagnosis not present

## 2015-12-20 NOTE — ED Notes (Signed)
Pt states she at [facility] at approx 9pm, states about 30 mins later she started feeling her throat was raspy.  Pt speaks with a raspy voice.  Pt states she also had stomach upset.

## 2015-12-21 LAB — CBG MONITORING, ED: GLUCOSE-CAPILLARY: 124 mg/dL — AB (ref 65–99)

## 2015-12-21 MED ORDER — ALBUTEROL SULFATE HFA 108 (90 BASE) MCG/ACT IN AERS: 2.0000 | INHALATION_SPRAY | RESPIRATORY_TRACT | Status: AC | PRN

## 2015-12-21 MED ORDER — FAMOTIDINE 20 MG PO TABS
40.0000 mg | ORAL_TABLET | Freq: Once | ORAL | Status: AC
  Administered 2015-12-21: 40 mg via ORAL
  Filled 2015-12-21: qty 2

## 2015-12-21 MED ORDER — METHYLPREDNISOLONE 4 MG PO TBPK: ORAL_TABLET | ORAL | Status: DC

## 2015-12-21 MED ORDER — FAMOTIDINE 40 MG PO TABS: 40.0000 mg | ORAL_TABLET | Freq: Every day | ORAL | Status: DC

## 2015-12-21 MED ORDER — ONDANSETRON 4 MG PO TBDP
4.0000 mg | ORAL_TABLET | Freq: Once | ORAL | Status: DC
  Filled 2015-12-21: qty 1

## 2015-12-21 MED ORDER — PREDNISONE 50 MG PO TABS: 60.0000 mg | ORAL_TABLET | Freq: Once | ORAL | Status: DC

## 2015-12-21 MED ORDER — ALBUTEROL SULFATE (2.5 MG/3ML) 0.083% IN NEBU
2.5000 mg | INHALATION_SOLUTION | Freq: Once | RESPIRATORY_TRACT | Status: AC
  Administered 2015-12-21: 2.5 mg via RESPIRATORY_TRACT
  Filled 2015-12-21: qty 3

## 2015-12-21 MED ORDER — METHYLPREDNISOLONE SODIUM SUCC 125 MG IJ SOLR
125.0000 mg | Freq: Once | INTRAMUSCULAR | Status: DC
  Filled 2015-12-21: qty 2

## 2015-12-21 MED ORDER — DIPHENHYDRAMINE HCL 25 MG PO CAPS
50.0000 mg | ORAL_CAPSULE | Freq: Once | ORAL | Status: AC
  Administered 2015-12-21: 50 mg via ORAL
  Filled 2015-12-21: qty 2

## 2015-12-21 NOTE — ED Provider Notes (Signed)
TIME SEEN: 12:57 AM    CHIEF COMPLAINT: Allergic Reaction   HPI:  HPI Comments: Shannon [name] is a 50 y.o. female, with a h/o anxiety and depression, HTN, HLD, CAD, and MI, who presents to the Emergency Department for evaluation of a possible allergic reaction, presenting as a dry mouth, throat, and nose. Pt states after eating dinner this evening she began to feel like her throat was closing up and becoming very dry with an associated hoarse voice. She then took her prescribed Xanax as she states her 'nerves were bothering' her. She is unsure of consumption of any allergic agents and denies use of any new medications, foods, or hygienic products. Pt also admits to a recent cough with phlegm. Denies fever. She denies any rashes visualized, recent medication changes, known allergies to foods consumed or any other complaints. States she has been tested for different allergens and states she is allergic to dust.   ROS: See HPI Constitutional: no fever  Eyes: no drainage  ENT: no runny nose   Cardiovascular:  no chest pain  Resp: no SOB  GI: no vomiting GU: no dysuria Integumentary: no rash  Allergy: no hives  Musculoskeletal: no leg swelling  Neurological: no slurred speech ROS otherwise negative  PAST MEDICAL HISTORY/PAST SURGICAL HISTORY:  Past Medical History  Diagnosis Date  . Anxiety   . Depression   . Hypertension   . Hyperlipidemia   . Coronary artery disease     LHC 11/25/12 90% stenosis mid LCx & otherwise nonobstructive dz w/ EF 60-65% S/p PTCA/DES to LCx  . Tobacco abuse   . MI (myocardial infarction) ([place])     MEDICATIONS:  Prior to Admission medications   Medication Sig Start Date End Date Taking? Authorizing Provider  albuterol (PROVENTIL HFA;VENTOLIN HFA) 108 (90 Base) MCG/ACT inhaler Inhale 1 puff into the lungs every 6 (six) hours as needed for wheezing or shortness of breath.   Yes Historical Provider, MD  ALPRAZolam Duanne Moron) 1 MG tablet Take 1 mg by mouth 2  (two) times daily as needed for anxiety.    Yes Historical Provider, MD  aspirin 81 MG tablet Take 81 mg by mouth daily.   Yes Historical Provider, MD  escitalopram (LEXAPRO) 20 MG tablet Take 1 tablet (20 mg total) by mouth daily. 02/02/14 12/20/15 Yes Leonides Grills, MD  ibuprofen (ADVIL,MOTRIN) 200 MG tablet Take 600 mg by mouth 2 (two) times daily as needed for pain.   Yes Historical Provider, MD  nitroGLYCERIN (NITROSTAT) 0.4 MG SL tablet Place 1 tablet (0.4 mg total) under the tongue every 5 (five) minutes as needed for chest pain (up to 3 doses). 11/16/12  Yes Jessica A Hope, PA-C  metoprolol succinate (TOPROL XL) 25 MG 24 hr tablet Take 0.5 tablets (12.5 mg total) by mouth daily. 03/28/15   Herminio Commons, MD  SIMVASTATIN PO Take by mouth daily.    Historical Provider, MD    ALLERGIES:  Allergies  Allergen Reactions  . Morphine And Related Other (See Comments)    PATIENT REFUSES THIS MEDICATION: states that she does not tolerate this medication well    SOCIAL HISTORY:  Social History  Substance Use Topics  . Smoking status: Current Every Day Smoker -- 1.50 packs/day for 32 years    Types: Cigarettes  . Smokeless tobacco: Never Used     Comment: Currently 2ppd  . Alcohol Use: No    FAMILY HISTORY: Family History  Problem Relation Age of Onset  . Depression  Mother   . Anxiety disorder Maternal Grandmother   . Anxiety disorder Paternal Grandmother   . OCD Paternal Grandmother   . Depression Paternal Grandmother   . Heart attack Father     Deceased, 93  . Cerebral aneurysm Father     EXAM: BP 132/83 mmHg  Pulse 86  Temp(Src) 97.7 F (36.5 C) (Oral)  Ht 5\' 4"  (1.626 m)  Wt 265 lb (120.203 kg)  BMI 45.46 kg/m2  SpO2 100% CONSTITUTIONAL: Alert and oriented and responds appropriately to questions. Well-appearing; well-nourished HEAD: Normocephalic EYES: Conjunctivae clear, PERRL ENT: normal nose; no rhinorrhea; moist mucous membranes,  slightly hoarse voice;  No pharyngeal erythema or petechiae, no tonsillar hypertrophy or exudate, no uvular deviation, no trismus or drooling, no stridor, no dental caries or abscess noted, no Ludwig's angina, tongue sits flat in the bottom of the mouth; oropharynx is patent, no angioedema NECK: Supple, no meningismus, no LAD  CARD: RRR; S1 and S2 appreciated; no murmurs, no clicks, no rubs, no gallops RESP: Normal chest excursion without splinting or tachypnea; breath sounds clear and equal bilaterally; no wheezes, no rhonchi, no rales, no hypoxia or respiratory distress, speaking full sentences ABD/GI: Normal bowel sounds; non-distended; soft, non-tender, no rebound, no guarding, no peritoneal signs BACK:  The back appears normal and is non-tender to palpation, there is no CVA tenderness EXT: Normal ROM in all joints; non-tender to palpation; no edema; normal capillary refill; no cyanosis, no calf tenderness or swelling    SKIN: Normal color for age and race; warm; no rash, no urticaria  NEURO: Moves all extremities equally, sensation to light touch intact diffusely, cranial nerves II through XII intact PSYCH: Patient appears very anxious. Grooming and personal hygiene are appropriate.  MEDICAL DECISION MAKING: Patient here with hoarse voice and feeling like her throat is swelling. She thinks this is an allergic reaction. She does report recent cough with her hoarse voice and discussed with her that this may also be a viral illness. Her lungs are clear and she has no fever. We'll treat her symptoms with Benadryl, steroids, Pepcid. Do not feel she needs epinephrine. Have recommended oral and is in which she refuses stating that it makes her anxiety worse. She agrees to IM Solu-Medrol instead. Will monitor patient closely.  ED PROGRESS: Patient now refusing IM Solu-Medrol. She is demanding a breathing treatment and be placed on oxygen. Her oxygen saturation is 100% on room air at rest. We'll place her on oxygen for comfort.  Her lungs are clear and she has good aeration and no wheezing. She still is demanding albuterol. Will give 1 breathing treatment for symptomatic relief.   Patient reports feeling much better after albuterol, Benadryl and Pepcid. Still refusing steroids but agrees to take a prescription for the same. Discussed with her that she could have significant rebound and that steroids are indicated if this is an allergic reaction. She still refuses them. No sign of any pharyngitis on exam, deep space neck infection, peritonsillar abscess. Again her lungs are clear to auscultation. She has no hypoxia, respiratory distress, angioedema, urticaria. I feel she is safe to be discharged. We'll discharge with prescription for Medrol Dosepak although I feel patient will not take this medication. Will also discharge with prescription for Pepcid and have advised her to use Benadryl as needed. We'll discharge patient home with close outpatient follow-up. Discussed return precautions. She verbalizes understanding is comfortable with this plan.  Nursing notes have been reviewed.     I personally performed  the services described in this documentation, which was scribed in my presence. The recorded information has been reviewed and is accurate.    [place], DO 12/21/15 [phone]

## 2015-12-21 NOTE — Discharge Instructions (Signed)

## 2015-12-21 NOTE — ED Notes (Addendum)
Feels like everything is drying up, my throat and nose are really dry. Patient states that she felt so anxious that she took her total daily dose of xanax. States that she was so anxious that she vomited on herself.  Said she got anxious because she thought her throat was closing up. But it is just sore now.  Gets worried because she knows she has a carotid artery closing up and doesn't know what all it causes.

## 2015-12-21 NOTE — ED Notes (Signed)
MD at bedside. 

## 2016-02-05 ENCOUNTER — Telehealth (HOSPITAL_COMMUNITY): Payer: Self-pay | Admitting: *Deleted

## 2016-04-14 ENCOUNTER — Ambulatory Visit (HOSPITAL_COMMUNITY): Payer: Self-pay | Admitting: Psychiatry

## 2016-06-16 ENCOUNTER — Ambulatory Visit (HOSPITAL_COMMUNITY): Payer: Self-pay | Admitting: Psychiatry

## 2016-06-16 ENCOUNTER — Encounter (HOSPITAL_COMMUNITY): Payer: Self-pay

## 2016-06-24 ENCOUNTER — Telehealth: Payer: Self-pay | Admitting: Cardiovascular Disease

## 2016-06-24 NOTE — Telephone Encounter (Signed)
Numerous attempts to contact patient with recall letters with no success.

## 2016-07-03 ENCOUNTER — Other Ambulatory Visit (HOSPITAL_COMMUNITY): Payer: Self-pay | Admitting: Physician Assistant

## 2016-07-03 DIAGNOSIS — R1310 Dysphagia, unspecified: Secondary | ICD-10-CM

## 2016-07-08 ENCOUNTER — Ambulatory Visit (HOSPITAL_COMMUNITY): Admission: RE | Admit: 2016-07-08 | Payer: Commercial Managed Care - PPO | Source: Ambulatory Visit

## 2016-10-13 ENCOUNTER — Ambulatory Visit (INDEPENDENT_AMBULATORY_CARE_PROVIDER_SITE_OTHER): Payer: Commercial Managed Care - PPO | Admitting: Cardiovascular Disease

## 2016-10-13 ENCOUNTER — Encounter: Payer: Self-pay | Admitting: Cardiovascular Disease

## 2016-10-13 ENCOUNTER — Encounter: Payer: Self-pay | Admitting: *Deleted

## 2016-10-13 VITALS — BP 116/81 | HR 73 | Ht 65.0 in | Wt 262.0 lb

## 2016-10-13 DIAGNOSIS — I10 Essential (primary) hypertension: Secondary | ICD-10-CM

## 2016-10-13 DIAGNOSIS — I25118 Atherosclerotic heart disease of native coronary artery with other forms of angina pectoris: Secondary | ICD-10-CM

## 2016-10-13 DIAGNOSIS — Z01818 Encounter for other preprocedural examination: Secondary | ICD-10-CM

## 2016-10-13 DIAGNOSIS — Z955 Presence of coronary angioplasty implant and graft: Secondary | ICD-10-CM

## 2016-10-13 DIAGNOSIS — I252 Old myocardial infarction: Secondary | ICD-10-CM

## 2016-10-13 DIAGNOSIS — E782 Mixed hyperlipidemia: Secondary | ICD-10-CM

## 2016-10-13 DIAGNOSIS — I6529 Occlusion and stenosis of unspecified carotid artery: Secondary | ICD-10-CM | POA: Diagnosis not present

## 2016-10-13 DIAGNOSIS — Z720 Tobacco use: Secondary | ICD-10-CM

## 2016-10-13 MED ORDER — ATORVASTATIN CALCIUM 20 MG PO TABS: 20.0000 mg | ORAL_TABLET | Freq: Every day | ORAL | 3 refills | Status: DC

## 2016-10-13 NOTE — Patient Instructions (Signed)
Your physician wants you to follow-up in: Shannon [name] You will receive a reminder letter in the mail two months in advance. If you don't receive a letter, please call our office to schedule the follow-up appointment.  Your physician has recommended you make the following change in your medication:   START LIPITOR 20 MG DAILY  Your physician has requested that you have a carotid duplex. This test is an ultrasound of the carotid arteries in your neck. It looks at blood flow through these arteries that supply the brain with blood. Allow one hour for this exam. There are no restrictions or special instructions.  Thank you for choosing [facility]!!

## 2016-10-13 NOTE — Progress Notes (Signed)
SUBJECTIVE: The patient presents for routine follow up. She has a history of coronary artery disease, hypertension, tobacco abuse, morbid obesity, and hyperlipidemia. She has a history of a non-STEMI in February 2014 and underwent drug-eluting stent placement to the left circumflex coronary artery.  She suffers from anxiety and depression and sees a therapist.   An echocardiogram on 05/25/13 demonstrated "probably normal left ventricular systolic function" but they were poor quality images.   Carotid Dopplers on 01/24/15 showed greater than 70% left internal carotid artery stenosis and less than 50% right sided internal carotid artery stenosis.  She smokes cigarettes.  Nuclear stress test 07/25/15: Normal, LVEF 56%.  She did not get carotid Dopplers done when I had ordered them one and a half years ago. She has occasional chest pain but has not had to use nitroglycerin. She has chronic exertional dyspnea from COPD and tobacco abuse which has not gotten any worse. She has occasional palpitations. She is being evaluated for carpal tunnel surgery. She has cervical spine degenerative joint disease. She tells me cholesterol was elevated in the Fall of 2017 but stopped taking statin therapy on her own. She was also diagnosed with low vitamin B-12 and vitamin D levels.  ECG performed in the office today which I personally reviewed demonstrates normal sinus rhythm with no ischemic ST segment or T-wave abnormalities, nor any arrhythmias.    Review of Systems: As per "subjective", otherwise negative.  Allergies  Allergen Reactions  . Morphine And Related Other (See Comments)    PATIENT REFUSES THIS MEDICATION: states that she does not tolerate this medication well    Current Outpatient Prescriptions  Medication Sig Dispense Refill  . albuterol (PROVENTIL HFA;VENTOLIN HFA) 108 (90 Base) MCG/ACT inhaler Inhale 2 puffs into the lungs every 4 (four) hours as needed for wheezing or shortness of  breath. 1 Inhaler 0  . ALPRAZolam (XANAX) 0.5 MG tablet Take 0.5 mg by mouth 2 (two) times daily as needed for anxiety.    Marland Kitchen aspirin 81 MG tablet Take 81 mg by mouth daily.    Marland Kitchen escitalopram (LEXAPRO) 20 MG tablet Take 20 mg by mouth daily.    . naproxen sodium (ALEVE) 220 MG tablet Take 220 mg by mouth 2 (two) times daily as needed.    . nitroGLYCERIN (NITROSTAT) 0.4 MG SL tablet Place 1 tablet (0.4 mg total) under the tongue every 5 (five) minutes as needed for chest pain (up to 3 doses). 25 tablet 3  . vitamin B-12 (CYANOCOBALAMIN) 1000 MCG tablet Take 1,000 mcg by mouth daily.    Marland Kitchen escitalopram (LEXAPRO) 20 MG tablet Take 1 tablet (20 mg total) by mouth daily. 30 tablet 0   No current facility-administered medications for this visit.     Past Medical History:  Diagnosis Date  . Anxiety   . Coronary artery disease    LHC 11/25/12 90% stenosis mid LCx & otherwise nonobstructive dz w/ EF 60-65% S/p PTCA/DES to LCx  . Depression   . Hyperlipidemia   . Hypertension   . MI (myocardial infarction)   . Tobacco abuse     Past Surgical History:  Procedure Laterality Date  . CORONARY ANGIOPLASTY WITH STENT PLACEMENT    . LEFT HEART CATHETERIZATION WITH CORONARY ANGIOGRAM N/A 11/15/2012   Procedure: LEFT HEART CATHETERIZATION WITH CORONARY ANGIOGRAM;  Surgeon: Thayer Headings, MD;  Location: [facility] C[facility] LAB;  Service: Cardiovascular;  Laterality: N/A;  . PERCUTANEOUS CORONARY STENT INTERVENTION (PCI-S)  11/15/2012   Procedure:  PERCUTANEOUS CORONARY STENT INTERVENTION (PCI-S);  Surgeon: Thayer Headings, MD;  Location: [facility] CATH LAB;  Service: Cardiovascular;;  . TONSILLECTOMY    . uterus ablation      Social History   Social History  . Marital status: Legally Separated    Spouse name: N/A  . Number of children: N/A  . Years of education: N/A   Occupational History  . Not on file.   Social History Main Topics  . Smoking status: Current Every Day Smoker    Packs/day: 2.00    Years: 32.00     Types: Cigarettes    Start date: 03/29/1979  . Smokeless tobacco: Never Used     Comment: Currently 2ppd  . Alcohol use No  . Drug use: No  . Sexual activity: Not Currently    Partners: Male   Other Topics Concern  . Not on file   Social History Narrative   Lives with husband in a 3 story home.  Has no children.     On disability.     Education: high school, some college.     Vitals:   10/13/16 1103  BP: 116/81  Pulse: 73  Weight: 262 lb (118.8 kg)  Height: 5\' 5"  (1.651 m)    PHYSICAL EXAM General: NAD HEENT: Normal. Neck: No JVD, no thyromegaly. Lungs: Clear to auscultation bilaterally with normal respiratory effort. CV: Nondisplaced PMI.  Regular rate and rhythm, normal S1/S2, no S3/S4, no murmur. No pretibial or periankle edema.  No carotid bruit.   Abdomen: Soft, obese.  Neurologic: Alert and oriented.  Psych: Normal affect. Skin: Normal. Musculoskeletal: No gross deformities.    ECG: Most recent ECG reviewed.     ASSESSMENT AND PLAN: 1.  CAD with left circumflex stent and prior NSTEMI: Symptomatically stable. Normal Lexiscan Cardiolite stress test 06/2015. Continue aspirin. No longer on statin and beta blocker. Will start Lipitor 20 mg.  2. Essential HTN: Controlled. No changes.  3. Dyslipidemia: No longer on simvastatin. Will start Lipitor 20 mg. Will obtain copy of lipids from PCP.  4. Carotid artery stenosis: Will repeat Dopplers. Continue ASA. No longer on statin therapy. Will start Lipitor 20 mg.  5. Tobacco abuse: Cessation counseling previously provided.  6. Preoperative risk stratification: Can safely proceed with carpal tunnel surgery. Does not require noninvasive testing.  Dispo: f/u 1 yr   Kate Sable, M.D., F.A.C.C.

## 2016-10-21 ENCOUNTER — Other Ambulatory Visit (HOSPITAL_COMMUNITY): Payer: Self-pay | Admitting: Physician Assistant

## 2016-10-21 ENCOUNTER — Ambulatory Visit (HOSPITAL_COMMUNITY)
Admission: RE | Admit: 2016-10-21 | Discharge: 2016-10-21 | Disposition: A | Discharge status: Home / Self Care | Payer: Commercial Managed Care - PPO | Source: Ambulatory Visit | Attending: Physician Assistant | Admitting: Physician Assistant

## 2016-10-21 DIAGNOSIS — K2980 Duodenitis without bleeding: Secondary | ICD-10-CM | POA: Diagnosis not present

## 2016-10-21 DIAGNOSIS — R1310 Dysphagia, unspecified: Secondary | ICD-10-CM

## 2016-10-21 IMAGING — RF DG UGI W/ HIGH DENSITY W/KUB
14 of 24 series · 14 of 24 positions shown · non-contrast
Comparison: None

CLINICAL DATA: Abdominal discomfort in epigastrium, bloating,
nausea, dysphagia

EXAM:
UPPER GI SERIES WITH KUB
TECHNIQUE: After obtaining a scout radiograph a routine upper GI series was
performed using thin and high density barium.
FLUOROSCOPY TIME:  Fluoroscopy Time:  2 minutes 12 seconds
Radiation Exposure Index (if provided by the fluoroscopic device):
80.0 mGy
Number of Acquired Spot Images: 3 plus multiple screen captures
during fluoroscopy

[Series 1: t abdomen supine · 0.15mm/px · 1 of 1 slices shown (1 of 2)]
[im 1/1]
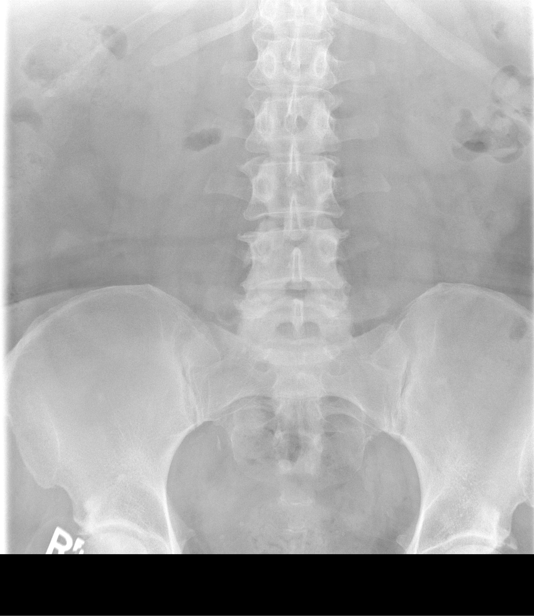

[Series 3: t abdomen supine · 0.15mm/px · 1 of 1 slices shown (2 of 2)]
[im 1/1]
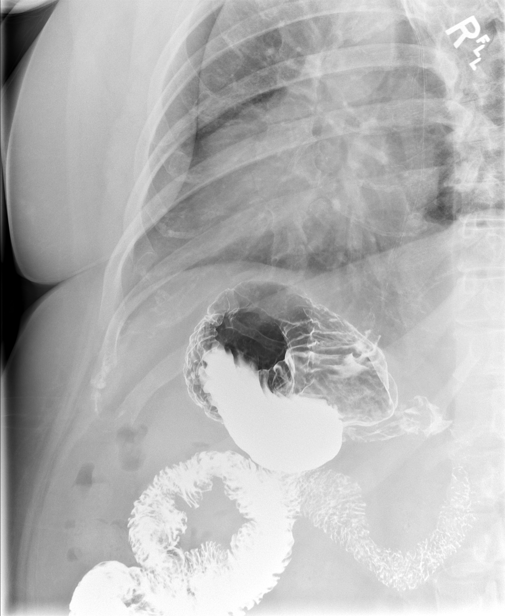

[Series 5: cp_standard · 0.19mm/px · 1 of 1 slices shown (1 of 12)]
[im 1/1]
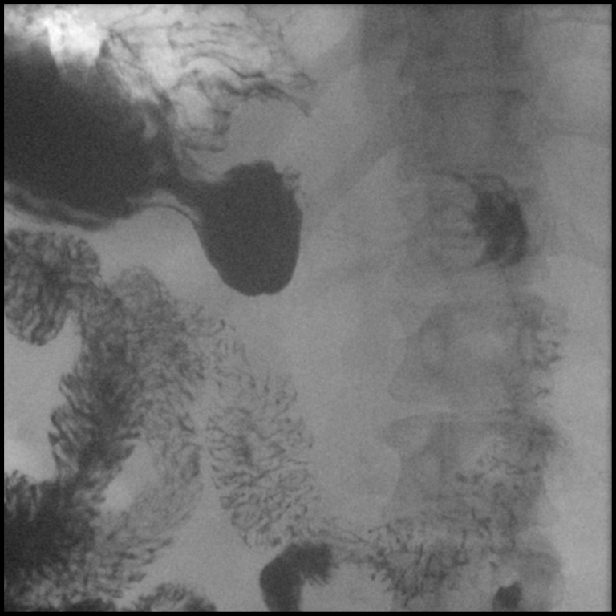

[Series 7: cp_standard · 0.19mm/px · 1 of 1 slices shown (2 of 12)]
[im 1/1]
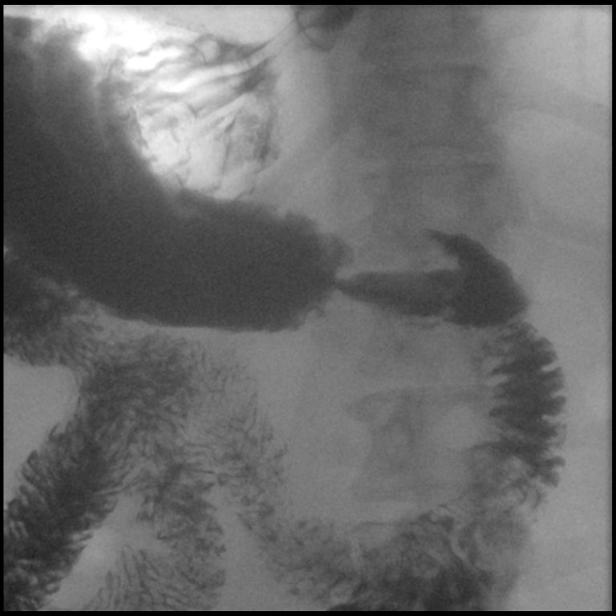

[Series 8: cp_standard · 0.19mm/px · 1 of 1 slices shown (3 of 12)]
[im 1/1]
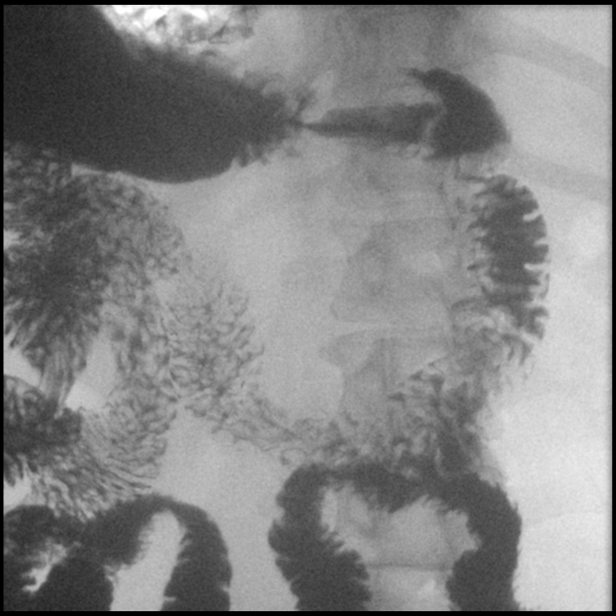

[Series 10: cp_standard · 0.19mm/px · 1 of 1 slices shown (4 of 12)]
[im 1/1]
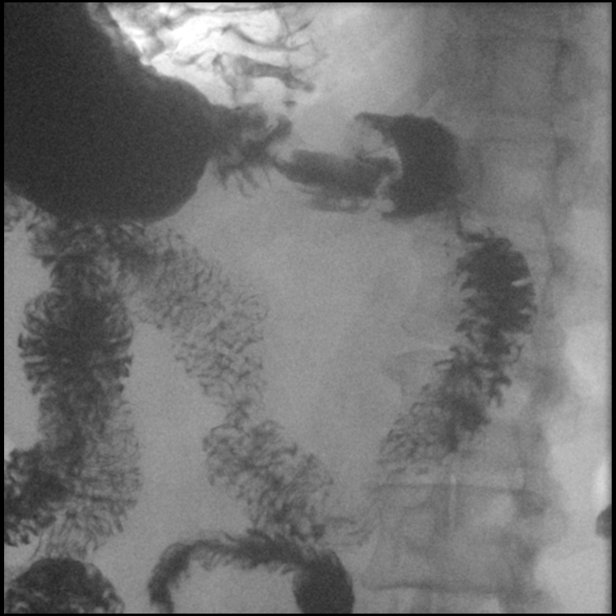

[Series 12: cp_standard · 0.19mm/px · 1 of 1 slices shown (5 of 12)]
[im 1/1]
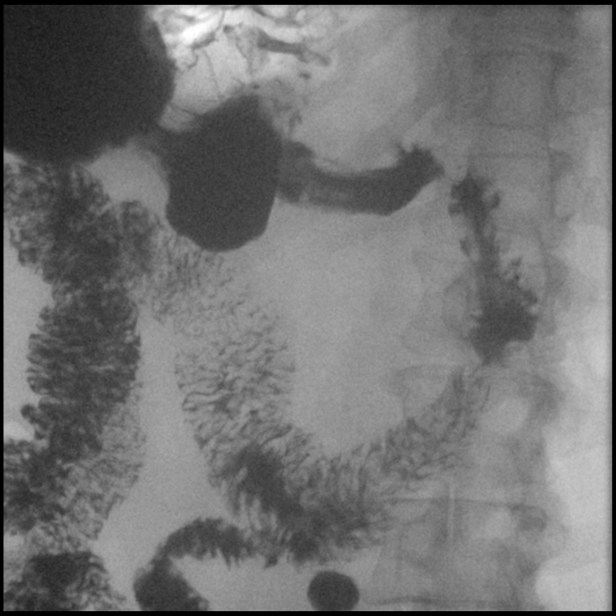

[Series 13: cp_standard · 0.19mm/px · 1 of 1 slices shown (6 of 12)]
[im 1/1]
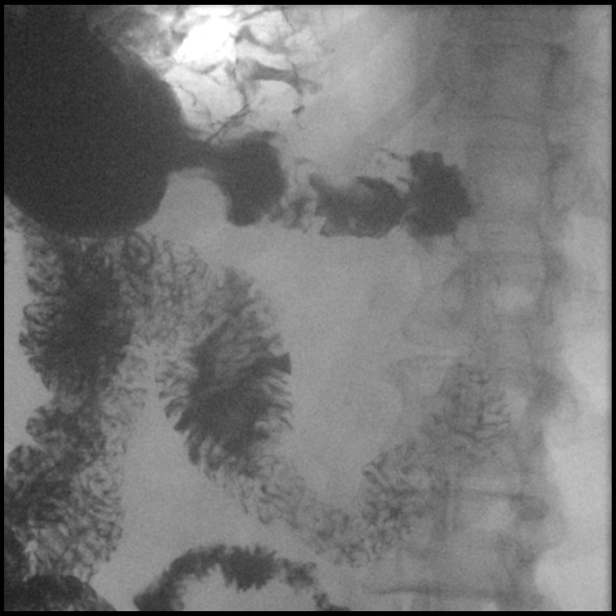

[Series 15: cp_standard · 0.19mm/px · 1 of 1 slices shown (7 of 12)]
[im 1/1]
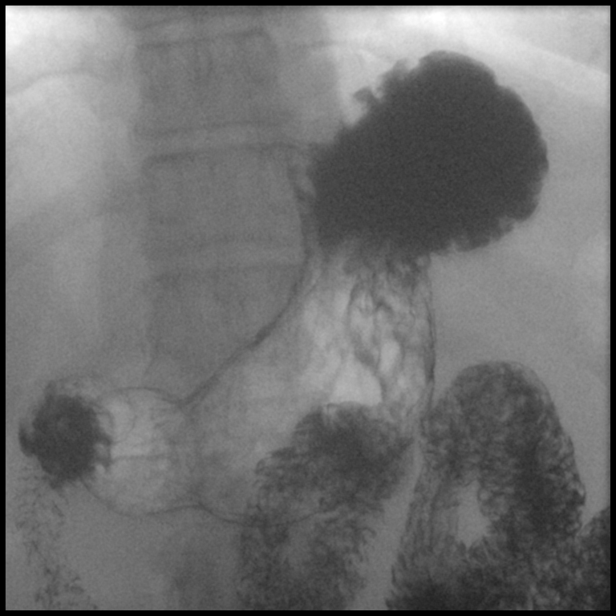

[Series 17: cp_standard · 0.19mm/px · 1 of 1 slices shown (8 of 12)]
[im 1/1]
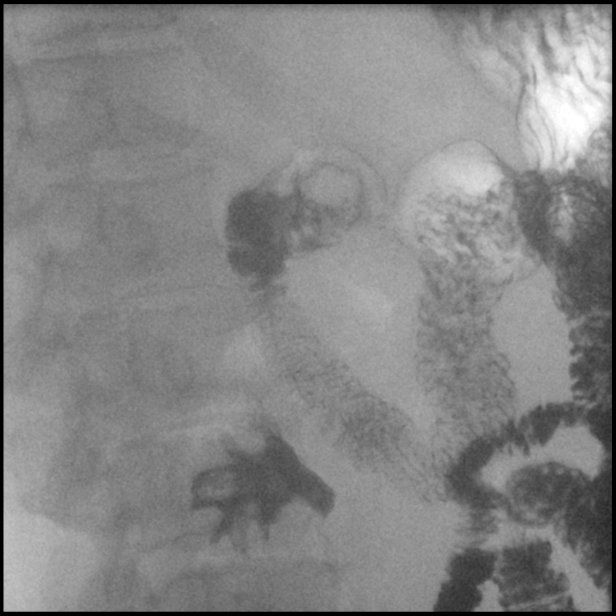

[Series 19: cp_standard · 0.19mm/px · 1 of 1 slices shown (9 of 12)]
[im 1/1]
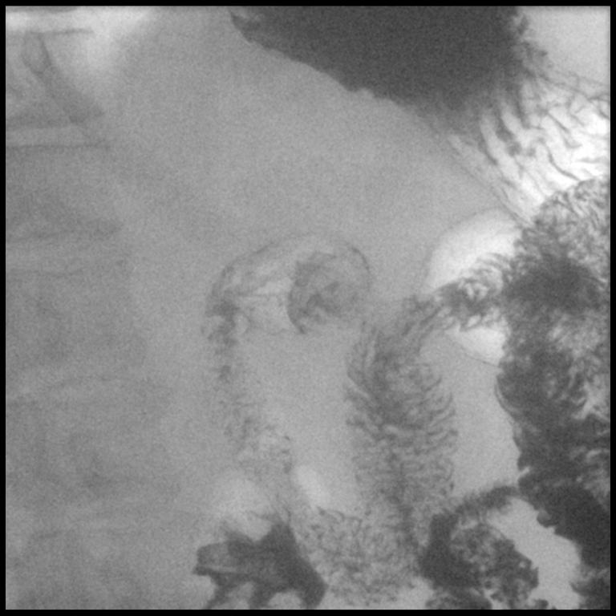

[Series 20: cp_standard · 0.19mm/px · 1 of 1 slices shown (10 of 12)]
[im 1/1]
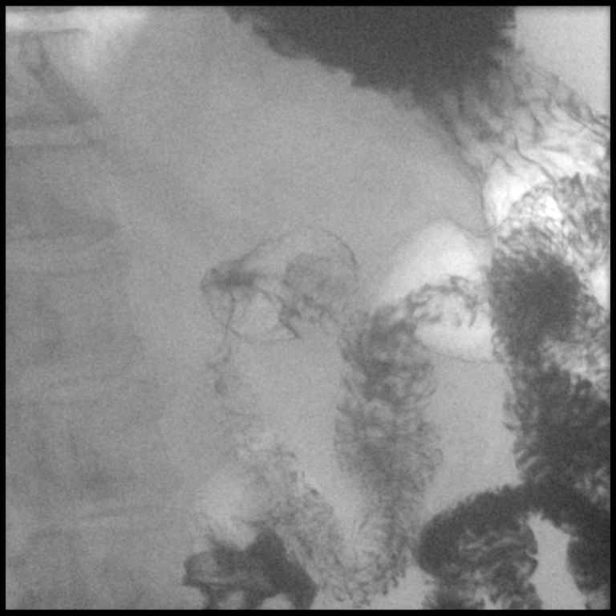

[Series 22: cp_standard · 0.19mm/px · 1 of 1 slices shown (11 of 12)]
[im 1/1]
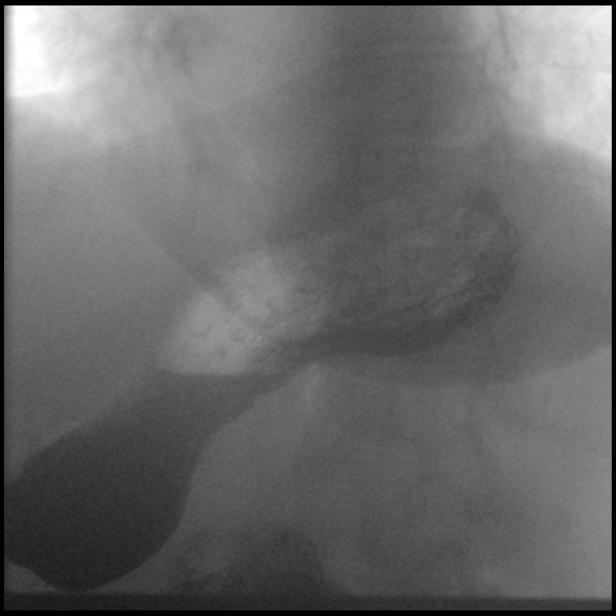

[Series 24: cp_standard · 0.19mm/px · 1 of 1 slices shown (12 of 12)]
[im 1/1]
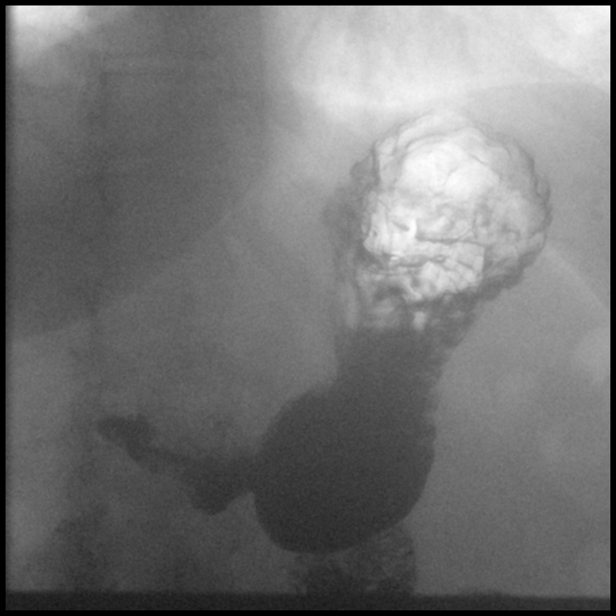

[14 of 24 positions shown; findings below may reference images not displayed]

FINDINGS: Real-time imaging of esophagus demonstrated normal distention and
motility without stricture.

No esophageal wall irregularity was seen at real-time imaging.

Stomach distends normally with normal rugal fold pattern.

No gastric mass or outlet obstruction.

Mucosal thickening of the duodenal bulb which appears to extends
back into the distal portion of the pyloric channel compatible with
duodenitis.

No discrete duodenal ulcer or contrast extravasation seen.

Normal morphology of the duodenal C-loop with normal position of
ligament of Treitz.

Jejunal and ileal small bowel loops are normal in appearance.
IMPRESSION: Duodenitis changes at the duodenal bulb without discrete duodenal
ulcer.

## 2016-11-05 ENCOUNTER — Ambulatory Visit: Payer: Commercial Managed Care - PPO

## 2016-11-05 DIAGNOSIS — I6523 Occlusion and stenosis of bilateral carotid arteries: Secondary | ICD-10-CM | POA: Diagnosis not present

## 2016-11-05 DIAGNOSIS — I6529 Occlusion and stenosis of unspecified carotid artery: Secondary | ICD-10-CM

## 2016-11-07 ENCOUNTER — Telehealth: Payer: Self-pay | Admitting: Cardiovascular Disease

## 2016-11-07 DIAGNOSIS — I6529 Occlusion and stenosis of unspecified carotid artery: Secondary | ICD-10-CM

## 2016-11-07 LAB — VAS US CAROTID
LCCADSYS: -75 cm/s
LCCAPDIAS: 25 cm/s
LEFT ECA DIAS: -48 cm/s
LEFT VERTEBRAL DIAS: -19 cm/s
Left CCA dist dias: -16 cm/s
Left CCA prox sys: 148 cm/s
RCCADSYS: -114 cm/s
RIGHT ECA DIAS: -56 cm/s
RIGHT VERTEBRAL DIAS: -20 cm/s
Right CCA prox dias: 46 cm/s
Right CCA prox sys: 138 cm/s

## 2016-11-07 NOTE — Telephone Encounter (Signed)
PATIENT asking for test results

## 2016-11-10 NOTE — Telephone Encounter (Signed)
Patient had concerns about the left side being occluded.  Requesting further explanation.  Tried to explain to patient that when the area is occluded, the blood flow will find another route to get to where it needs to go.  Also, said that she had discussed with you that she had some Crestor at home that she would like to finish up before beginning the Lipitor & still on ASA 81mg  qd.  Not sure how compliant she is with her medications though.

## 2016-11-10 NOTE — Telephone Encounter (Signed)
Notes Recorded by Laurine Blazer, LPN on [id number] at 075-GRM PM EST Patient notified. Copy to pmd. ------  Notes Recorded by Herminio Commons, MD on 11/10/2016 at 4:34 PM EST Mild to moderate right sided blockage and occluded left side. Repeat in one year.

## 2016-11-11 NOTE — Telephone Encounter (Signed)
Have her see vascular surgery.

## 2016-11-11 NOTE — Telephone Encounter (Signed)
Patient called back with continued concerns & states that she wants more explanation in regards to the total occlusion.  Stated that when she was at 70% that she had been referred to vascular surgeon back then, but she never went to that appointment.  Would like to come in for an office visit to discuss further so that someone can explain in detail her results. Or have a call from the doctor to explain further.

## 2016-11-11 NOTE — Telephone Encounter (Signed)
Yes, not sure about her compliance either. When I saw her in 02/2015, she was supposed to follow up with me 3-4 months later yet only did so in January 2018.  Your explanation of her ICA occlusion was satisfactory.

## 2016-11-12 NOTE — Telephone Encounter (Signed)
Referral entered into EPIC

## 2016-11-12 NOTE — Telephone Encounter (Signed)
Spoke with patient and explained to her Dr. Bronson Ing is referring her to a vascular surgeon for a consultation as a precaution.  She agreed with the referral and will contact us by Friday if she has not received a call from the surgeon's office.

## 2016-11-14 ENCOUNTER — Ambulatory Visit: Payer: Self-pay | Admitting: Cardiovascular Disease

## 2016-11-19 ENCOUNTER — Ambulatory Visit (INDEPENDENT_AMBULATORY_CARE_PROVIDER_SITE_OTHER): Payer: Commercial Managed Care - PPO | Admitting: Vascular Surgery

## 2016-11-19 ENCOUNTER — Encounter: Payer: Self-pay | Admitting: Vascular Surgery

## 2016-11-19 VITALS — BP 137/94 | HR 95 | Temp 97.0°F | Resp 18 | Ht 65.0 in | Wt 265.0 lb

## 2016-11-19 DIAGNOSIS — I6523 Occlusion and stenosis of bilateral carotid arteries: Secondary | ICD-10-CM

## 2016-11-19 NOTE — Progress Notes (Signed)
Patient name: Shannon [name] MRN: [id number] DOB: 07/12/1966 Sex: female  REASON FOR CONSULT: Carotid disease. 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  HPI: Shannon [name] is a 51 y.o. female, who had a [facility] screening test in 2016 which reportedly showed a 70% left carotid stenosis. This is being followed and on a most recent carotid duplex scan she was found to have an occluded left internal carotid artery with a 40-59% right carotid stenosis. She was sent for vascular consultation.  Patient is right-handed. She denies any history of stroke, TIAs, expressive or receptive aphasia, or amaurosis fugax.  Past Medical History:  Diagnosis Date  . Anemia   . Anxiety   . Carotid artery occlusion   . Coronary artery disease    LHC 11/25/12 90% stenosis mid LCx & otherwise nonobstructive dz w/ EF 60-65% S/p PTCA/DES to LCx  . Depression   . Hyperlipidemia   . Hypertension   . MI (myocardial infarction)   . Tobacco abuse     Family History  Problem Relation Age of Onset  . Depression Mother   . Anxiety disorder Maternal Grandmother   . Anxiety disorder Paternal Grandmother   . OCD Paternal Grandmother   . Depression Paternal Grandmother   . Heart attack Father     Deceased, 70  . Cerebral aneurysm Father   . Heart disease Father     SOCIAL HISTORY: Social History   Social History  . Marital status: Legally Separated    Spouse name: N/A  . Number of children: N/A  . Years of education: N/A   Occupational History  . Not on file.   Social History Main Topics  . Smoking status: Current Every Day Smoker    Packs/day: 2.00    Years: 32.00    Types: Cigarettes    Start date: 03/29/1979  . Smokeless tobacco: Never Used     Comment: Currently 2ppd  . Alcohol use No  . Drug use: No  . Sexual activity: Not Currently    Partners: Male   Other Topics Concern  . Not on file   Social History Narrative   Lives with husband in a 3 story home.  Has no children.     On  disability.     Education: high school, some college.    Allergies  Allergen Reactions  . Morphine And Related Other (See Comments)    PATIENT REFUSES THIS MEDICATION: states that she does not tolerate this medication well    Current Outpatient Prescriptions  Medication Sig Dispense Refill  . albuterol (PROVENTIL HFA;VENTOLIN HFA) 108 (90 Base) MCG/ACT inhaler Inhale 2 puffs into the lungs every 4 (four) hours as needed for wheezing or shortness of breath. 1 Inhaler 0  . ALPRAZolam (XANAX) 0.5 MG tablet Take 0.5 mg by mouth 2 (two) times daily as needed for anxiety.    Marland Kitchen aspirin 81 MG tablet Take 81 mg by mouth daily.    Marland Kitchen atorvastatin (LIPITOR) 20 MG tablet Take 1 tablet (20 mg total) by mouth daily. 90 tablet 3  . escitalopram (LEXAPRO) 20 MG tablet Take 1 tablet (20 mg total) by mouth daily. 30 tablet 0  . escitalopram (LEXAPRO) 20 MG tablet Take 20 mg by mouth daily.    . naproxen sodium (ALEVE) 220 MG tablet Take 220 mg by mouth 2 (two) times daily as needed.    . nitroGLYCERIN (NITROSTAT) 0.4 MG SL tablet Place 1 tablet (0.4 mg total) under the tongue every 5 (  five) minutes as needed for chest pain (up to 3 doses). 25 tablet 3  . vitamin B-12 (CYANOCOBALAMIN) 1000 MCG tablet Take 1,000 mcg by mouth daily.     No current facility-administered medications for this visit.     REVIEW OF SYSTEMS:  [X]  denotes positive finding, [ ]  denotes negative finding Cardiac  Comments:  Chest pain or chest pressure:    Shortness of breath upon exertion: X   Short of breath when lying flat:    Irregular heart rhythm:        Vascular    Pain in calf, thigh, or hip brought on by ambulation:    Pain in feet at night that wakes you up from your sleep:     Blood clot in your veins:    Leg swelling:         Pulmonary    Oxygen at home:    Productive cough:     Wheezing:         Neurologic    Sudden weakness in arms or legs:     Sudden numbness in arms or legs:  X   Sudden onset of  difficulty speaking or slurred speech:    Temporary loss of vision in one eye:     Problems with dizziness:         Gastrointestinal    Blood in stool:     Vomited blood:         Genitourinary    Burning when urinating:     Blood in urine:        Psychiatric    Major depression:         Hematologic    Bleeding problems:    Problems with blood clotting too easily:        Skin    Rashes or ulcers:        Constitutional    Fever or chills:      PHYSICAL EXAM: Vitals:   11/19/16 1113 11/19/16 1117  BP: 132/85 (!) 137/94  Pulse: 95   Resp: 18   Temp: 97 F (36.1 C)   TempSrc: Oral   SpO2: 95%   Weight: 265 lb (120.2 kg)   Height: 5\' 5"  (1.651 m)     GENERAL: The patient is a well-nourished female, in no acute distress. The vital signs are documented above. CARDIAC: There is a regular rate and rhythm.  VASCULAR: I do not detect carotid bruits. PULMONARY: There is good air exchange bilaterally without wheezing or rales. ABDOMEN: Soft and non-tender with normal pitched bowel sounds.  MUSCULOSKELETAL: There are no major deformities or cyanosis. NEUROLOGIC: No focal weakness or paresthesias are detected. SKIN: There are no ulcers or rashes noted. PSYCHIATRIC: The patient has a normal affect.  DATA:   CAROTID DUPLEX: I have reviewed the carotid duplex scan that was done at the outpatient vascular lab in [place].   On the right side there was a 40-59% stenosis.  The left internal carotid artery was occluded.  MEDICAL ISSUES:  BILATERAL CAROTID DISEASE: This patient has an occluded left internal carotid artery and I have explained that there is no surgical options for this. On the right side she has a 40-59% stenosis. Given that the velocities on the right are likely falsely elevated, because of the contralateral occlusion, this stenosis is probably closer to 40%. I would normally recommend a follow up carotid duplex scan in 1 year however given that the stenosis on the  left went from 70% to  occluded I would like 16 month study before extending her follow up to 1 year. If this stenosis is stable at 6 months and I think we can follow this on a yearly basis.  In addition, I have had a long discussion with her about the importance of tobacco cessation. We have also discussed the importance of nutrition and exercise. She is on aspirin and is on a statin and no stent continue these.  I will see her back in 6 months. She knows to call sooner if she has problems.   Deitra Mayo [facility] [phone]

## 2016-11-24 NOTE — Addendum Note (Signed)
Addended by: Lianne Cure A on: 11/24/2016 03:47 PM   Modules accepted: Orders

## 2017-01-06 ENCOUNTER — Telehealth: Payer: Self-pay | Admitting: Vascular Surgery

## 2017-01-06 NOTE — Telephone Encounter (Signed)
-----   Message -----  From: Kaleen Mask, LPN  Sent: 7/65/4650  2:23 PM  To: Angelia Mould, MD  Subject: Surgical Clearance                Joelene Millin with [employer] called and left voice msg. Is this pt cleared for carpel tunnel surgery? [phone].  Thanks,  Vivyan Biggers E, LPN.   ---------------  Yes. OK to proceed.  Thanks  CD    --------------- Joelene Millin will make pt aware.

## 2017-01-07 ENCOUNTER — Telehealth: Payer: Self-pay | Admitting: Cardiovascular Disease

## 2017-01-07 NOTE — Telephone Encounter (Signed)
Patient was diagnosed with bronchitis on this past Sunday afternoon by [facility]. Patient c/o productive coughing with light brown sputum, congestion, headache rated 8/10 that is relieved by advil, chest pain/tightness rated 5-6/10 that only happens at night, intermittent lightheadedness that doesn't affect her ability to do her ADL's and she was able to drive herself to [facility], and elevated BP. Patient said her BP was 153/101 at [facility] on Sunday. Patient said that her PCP started her on a blood pressure medication 2-3 weeks ago but she has not started it yet. Patient denies, fever, chills,n/v, swelling and active chest pain. Patient said that her SOB is the same as it was prior to going to Urgent Care. Patient said that her symptoms were improving and seemed to have gotten worse again on last night. Patient said she is currently taking augmentin, tesselon pearles for her bronchitis. Patient said that her cough has improved. Patient stated that she was also prescribed a nasal spray that she has not started yet. Patient said she sleeps in a recliner but has been doing this for several months prior to bronchitis diagnosis. Patient was given a breathing tx at Urgent Care and said that it helped her sob. Patient has an albuterol inhaler at home but says it doesn't help. Patient said she gained 5 lbs in a months time. Patient did not sound to be in any respiratory distress while on the phone. Patient was given an appointment to see Dr. Raliegh Ip on 01/09/17 by front staff. Patient was advised to contact [facility] about her symptoms since they recently evaluated her or contact her PCP. Patient also advised that if her symptoms got worse, that she needed to go to the ED for an evaluation. Please advise.

## 2017-01-07 NOTE — Telephone Encounter (Signed)
patient called stating that she continues to have shortness of breath -seen in Urgent Care at [facility] on 01/04/17 - was told it was Bronchitis - patient is not conmfortable with the way she feels. She is scheduled for appointment in the [facility] on Friday 01/09/17.

## 2017-01-09 ENCOUNTER — Ambulatory Visit: Payer: Self-pay | Admitting: Cardiovascular Disease

## 2017-02-06 ENCOUNTER — Ambulatory Visit: Payer: Self-pay | Admitting: Cardiovascular Disease

## 2017-02-27 ENCOUNTER — Ambulatory Visit (INDEPENDENT_AMBULATORY_CARE_PROVIDER_SITE_OTHER): Payer: Commercial Managed Care - PPO | Admitting: Cardiovascular Disease

## 2017-02-27 ENCOUNTER — Encounter: Payer: Self-pay | Admitting: Cardiovascular Disease

## 2017-02-27 VITALS — BP 160/100 | HR 90 | Ht 65.5 in | Wt 264.0 lb

## 2017-02-27 DIAGNOSIS — Z716 Tobacco abuse counseling: Secondary | ICD-10-CM

## 2017-02-27 DIAGNOSIS — E782 Mixed hyperlipidemia: Secondary | ICD-10-CM | POA: Diagnosis not present

## 2017-02-27 DIAGNOSIS — I10 Essential (primary) hypertension: Secondary | ICD-10-CM | POA: Diagnosis not present

## 2017-02-27 DIAGNOSIS — I252 Old myocardial infarction: Secondary | ICD-10-CM

## 2017-02-27 DIAGNOSIS — I25118 Atherosclerotic heart disease of native coronary artery with other forms of angina pectoris: Secondary | ICD-10-CM

## 2017-02-27 DIAGNOSIS — Z955 Presence of coronary angioplasty implant and graft: Secondary | ICD-10-CM

## 2017-02-27 DIAGNOSIS — I6529 Occlusion and stenosis of unspecified carotid artery: Secondary | ICD-10-CM

## 2017-02-27 MED ORDER — LOSARTAN POTASSIUM 25 MG PO TABS: 25.0000 mg | ORAL_TABLET | Freq: Every day | ORAL | 6 refills | Status: DC

## 2017-02-27 MED ORDER — ROSUVASTATIN CALCIUM 10 MG PO TABS: 10.0000 mg | ORAL_TABLET | Freq: Every day | ORAL | 6 refills | Status: DC

## 2017-02-27 NOTE — Patient Instructions (Signed)
Medication Instructions:   Begin Losartan 25mg  daily.  Increase Crestor to 10mg  daily.  Continue all other medications.    Labwork:  BMET - due next week.  Lipids - Reminder:  Nothing to eat or drink after 12 midnight prior to labs.  Orders given today   Office will contact with results via phone or letter.    Testing/Procedures: none  Follow-Up: 4 months   Any Other Special Instructions Will Be Listed Below (If Applicable).  If you need a refill on your cardiac medications before your next appointment, please call your pharmacy.

## 2017-02-27 NOTE — Addendum Note (Signed)
Addended by: Laurine Blazer on: 02/27/2017 03:40 PM   Modules accepted: Orders

## 2017-02-27 NOTE — Progress Notes (Signed)
SUBJECTIVE:  The patient presents for routine follow up. She has a history of coronary artery disease, hypertension, tobacco abuse, morbid obesity, and hyperlipidemia. She has a history of a non-STEMI in February 2014 and underwent drug-eluting stent placement to the left circumflex coronary artery.  She suffers from anxiety and depression and sees a therapist.   An echocardiogram on 05/25/13 demonstrated "probably normal left ventricular systolic function" but they were poor quality images.   Carotid Dopplers 11/07/16 showed 40-59% right sided carotid artery stenosis with occlusion of the left side. She saw vascular surgery who felt that the right sided velocities were falsely elevated because of contralateral occlusion and stenosis is probably closer to 40%. He recommended repeat Dopplers in 6 months.  She smokes cigarettes.  Nuclear stress test 07/25/15: Normal, LVEF 56%.  She said she is very stressed and is tearful. Blood pressure 160/100 today. She says she has noticed that her blood pressure has gradually been increasing over the past several months.  She denies chest pain. She says she is more forgetful lately. She has headaches when her blood pressure is elevated.  Lipids 06/30/16: Total cholesterol 303, triglycerides 232, HDL 47, LDL 209.   Review of Systems: As per "subjective", otherwise negative.  Allergies  Allergen Reactions  . Morphine And Related Other (See Comments)    PATIENT REFUSES THIS MEDICATION: states that she does not tolerate this medication well    Current Outpatient Prescriptions  Medication Sig Dispense Refill  . albuterol (PROVENTIL HFA;VENTOLIN HFA) 108 (90 Base) MCG/ACT inhaler Inhale 2 puffs into the lungs every 4 (four) hours as needed for wheezing or shortness of breath. 1 Inhaler 0  . ALPRAZolam (XANAX) 0.5 MG tablet Take 0.5 mg by mouth 2 (two) times daily as needed for anxiety.    Marland Kitchen aspirin 81 MG tablet Take 81 mg by mouth daily.    Marland Kitchen  escitalopram (LEXAPRO) 20 MG tablet Take 1 tablet (20 mg total) by mouth daily. 30 tablet 0  . nitroGLYCERIN (NITROSTAT) 0.4 MG SL tablet Place 1 tablet (0.4 mg total) under the tongue every 5 (five) minutes as needed for chest pain (up to 3 doses). 25 tablet 3  . rosuvastatin (CRESTOR) 5 MG tablet Take 5 mg by mouth at bedtime.    . vitamin B-12 (CYANOCOBALAMIN) 1000 MCG tablet Take 1,000 mcg by mouth daily.     No current facility-administered medications for this visit.     Past Medical History:  Diagnosis Date  . Anemia   . Anxiety   . Carotid artery occlusion   . Coronary artery disease    LHC 11/25/12 90% stenosis mid LCx & otherwise nonobstructive dz w/ EF 60-65% S/p PTCA/DES to LCx  . Depression   . Hyperlipidemia   . Hypertension   . MI (myocardial infarction) ([place])   . Tobacco abuse     Past Surgical History:  Procedure Laterality Date  . CORONARY ANGIOPLASTY WITH STENT PLACEMENT    . LEFT HEART CATHETERIZATION WITH CORONARY ANGIOGRAM N/A 11/15/2012   Procedure: LEFT HEART CATHETERIZATION WITH CORONARY ANGIOGRAM;  Surgeon: Thayer Headings, MD;  Location[facility] C[facility] LAB;  Service: Cardiovascular;  Laterality: N/A;  . PERCUTANEOUS CORONARY STENT INTERVENTION (PCI-S)  11/15/2012   Procedure: PERCUTANEOUS CORONARY STENT INTERVENTION (PCI-S);  Surgeon: Thayer Headings, MD;  Location: [facility] CATH LAB;  Service: Cardiovascular;;  . TONSILLECTOMY    . uterus ablation      Social History   Social History  . Marital  status: Legally Separated    Spouse name: N/A  . Number of children: N/A  . Years of education: N/A   Occupational History  . Not on file.   Social History Main Topics  . Smoking status: Current Every Day Smoker    Packs/day: 2.00    Years: 32.00    Types: Cigarettes    Start date: 03/29/1979  . Smokeless tobacco: Never Used     Comment: Currently 2ppd  . Alcohol use No  . Drug use: No  . Sexual activity: Not Currently    Partners: Male   Other Topics Concern   . Not on file   Social History Narrative   Lives with husband in a 3 story home.  Has no children.     On disability.     Education: high school, some college.     Vitals:   02/27/17 1508  BP: (!) 160/100  Pulse: 90  SpO2: 98%  Weight: 264 lb (119.7 kg)  Height: 5' 5.5" (1.664 m)    Wt Readings from Last 3 Encounters:  02/27/17 264 lb (119.7 kg)  11/19/16 265 lb (120.2 kg)  10/13/16 262 lb (118.8 kg)     PHYSICAL EXAM General: NAD HEENT: Normal. Neck: No JVD, no thyromegaly. Lungs: Diminished throughout, no crackles or wheezes. CV: Nondisplaced PMI.  Regular rate and rhythm, normal S1/S2, no S3/S4, no murmur. Trace b/l periankle edema.  No carotid bruit.   Abdomen: Soft, nontender, obese  Neurologic: Alert and oriented.  Psych: Tearful. Skin: Normal. Musculoskeletal: No gross deformities.    ECG: Most recent ECG reviewed.   Labs: Lab Results  Component Value Date/Time   K 3.5 03/22/2015 11:28 PM   BUN 10 03/22/2015 11:28 PM   CREATININE 0.94 03/22/2015 11:28 PM   ALT 10 08/10/2013 10:47 PM   HGB 13.6 03/22/2015 11:28 PM     Lipids: Lab Results  Component Value Date/Time   LDLDIRECT 200.3 09/04/2009 01:08 PM   CHOL 294 (H) 09/04/2009 01:08 PM   TRIG 244.0 (H) 09/04/2009 01:08 PM   HDL 39.00 (L) 09/04/2009 01:08 PM       ASSESSMENT AND PLAN: 1.  CAD with left circumflex stent and prior NSTEMI: Symptomatically stable. Normal Lexiscan Cardiolite stress test 06/2015. Continue aspirin. I will increase Crestor to 10 mg.  2. Essential HTN: Markedly elevated. I will start losartan 25 mg and check a basic metabolic panel to assess kidney function and potassium next week.  3. Dyslipidemia: Lipids from October 2017 detailed above. I will repeat lipids now. I will increase Crestor to 10 mg.  4. Carotid artery stenosis: Carotid Dopplers 11/07/16 showed 40-59% right sided carotid artery stenosis with occlusion of the left side. She saw vascular surgery who  felt that the right sided velocities were falsely elevated because of contralateral occlusion and stenosis is probably closer to 40%. He recommended repeat Dopplers in 6 months. Continue aspirin and statin.  5. Tobacco abuse: Cessation counseling again provided.    Disposition: Follow up 4 months.  Kate Sable, M.D., F.A.C.C.

## 2017-03-21 ENCOUNTER — Encounter (HOSPITAL_COMMUNITY): Payer: Self-pay | Admitting: Emergency Medicine

## 2017-03-21 ENCOUNTER — Emergency Department (HOSPITAL_COMMUNITY)
Admission: EM | Admit: 2017-03-21 | Discharge: 2017-03-21 | Disposition: A | Discharge status: Home / Self Care | Payer: Commercial Managed Care - PPO | Attending: Emergency Medicine | Admitting: Emergency Medicine

## 2017-03-21 DIAGNOSIS — F17210 Nicotine dependence, cigarettes, uncomplicated: Secondary | ICD-10-CM | POA: Insufficient documentation

## 2017-03-21 DIAGNOSIS — I2510 Atherosclerotic heart disease of native coronary artery without angina pectoris: Secondary | ICD-10-CM | POA: Insufficient documentation

## 2017-03-21 DIAGNOSIS — I10 Essential (primary) hypertension: Secondary | ICD-10-CM | POA: Insufficient documentation

## 2017-03-21 DIAGNOSIS — Z7982 Long term (current) use of aspirin: Secondary | ICD-10-CM | POA: Diagnosis not present

## 2017-03-21 DIAGNOSIS — R42 Dizziness and giddiness: Secondary | ICD-10-CM | POA: Diagnosis present

## 2017-03-21 DIAGNOSIS — I252 Old myocardial infarction: Secondary | ICD-10-CM | POA: Diagnosis not present

## 2017-03-21 HISTORY — DX: Chronic obstructive pulmonary disease, unspecified: J44.9

## 2017-03-21 HISTORY — DX: Personal history of other medical treatment: Z92.89

## 2017-03-21 HISTORY — DX: Dyspnea, unspecified: R06.00

## 2017-03-21 HISTORY — DX: Other forms of dyspnea: R06.09

## 2017-03-21 LAB — COMPREHENSIVE METABOLIC PANEL
ALK PHOS: 79 U/L (ref 38–126)
ALT: 20 U/L (ref 14–54)
ANION GAP: 9 (ref 5–15)
AST: 16 U/L (ref 15–41)
Albumin: 3.8 g/dL (ref 3.5–5.0)
BILIRUBIN TOTAL: 0.4 mg/dL (ref 0.3–1.2)
BUN: 12 mg/dL (ref 6–20)
CALCIUM: 9.1 mg/dL (ref 8.9–10.3)
CO2: 26 mmol/L (ref 22–32)
Chloride: 106 mmol/L (ref 101–111)
Creatinine, Ser: 1.08 mg/dL — ABNORMAL HIGH (ref 0.44–1.00)
GFR calc non Af Amer: 58 mL/min — ABNORMAL LOW (ref >60)
Glucose, Bld: 113 mg/dL — ABNORMAL HIGH (ref 65–99)
Potassium: 4.1 mmol/L (ref 3.5–5.1)
SODIUM: 141 mmol/L (ref 135–145)
Total Protein: 7.4 g/dL (ref 6.5–8.1)

## 2017-03-21 LAB — CBC WITH DIFFERENTIAL/PLATELET
Basophils Absolute: 0 10*3/uL (ref 0.0–0.1)
Basophils Relative: 0 %
Eosinophils Absolute: 0.2 10*3/uL (ref 0.0–0.7)
Eosinophils Relative: 2 %
HEMATOCRIT: 44.6 % (ref 36.0–46.0)
HEMOGLOBIN: 14.5 g/dL (ref 12.0–15.0)
LYMPHS ABS: 3.8 10*3/uL (ref 0.7–4.0)
LYMPHS PCT: 36 %
MCH: 28.2 pg (ref 26.0–34.0)
MCHC: 32.5 g/dL (ref 30.0–36.0)
MCV: 86.6 fL (ref 78.0–100.0)
MONOS PCT: 6 %
Monocytes Absolute: 0.6 10*3/uL (ref 0.1–1.0)
NEUTROS ABS: 5.9 10*3/uL (ref 1.7–7.7)
NEUTROS PCT: 56 %
Platelets: 152 10*3/uL (ref 150–400)
RBC: 5.15 MIL/uL — AB (ref 3.87–5.11)
RDW: 14.8 % (ref 11.5–15.5)
WBC: 10.5 10*3/uL (ref 4.0–10.5)

## 2017-03-21 LAB — URINALYSIS, ROUTINE W REFLEX MICROSCOPIC
BILIRUBIN URINE: NEGATIVE
Glucose, UA: NEGATIVE mg/dL
HGB URINE DIPSTICK: NEGATIVE
Ketones, ur: NEGATIVE mg/dL
Leukocytes, UA: NEGATIVE
NITRITE: NEGATIVE
PH: 5 (ref 5.0–8.0)
Protein, ur: NEGATIVE mg/dL
SPECIFIC GRAVITY, URINE: 1.009 (ref 1.005–1.030)

## 2017-03-21 LAB — TROPONIN I: Troponin I: <0.03 ng/mL (ref <0.03)

## 2017-03-21 MED ORDER — ONDANSETRON 4 MG PO TBDP
4.0000 mg | ORAL_TABLET | Freq: Once | ORAL | Status: AC
  Administered 2017-03-21: 4 mg via ORAL
  Filled 2017-03-21: qty 1

## 2017-03-21 NOTE — ED Notes (Signed)
Pt found her $15 in her pocket.

## 2017-03-21 NOTE — ED Provider Notes (Signed)
[facility] DEPT Provider Note   CSN: [id number] Arrival date & time: 03/21/17  1754     History   Chief Complaint Chief Complaint  Patient presents with  . Dizziness    HPI Shannon [name] is a 51 y.o. female with a history of CAD, HTN, prior MI and carotid artery occlusion which is evaluated every 6 months by vascular surgery and has been stable presenting with a one week history of episodic lightheadedness with near syncope along with fatigue, vague nausea, intermittent tingling in her hands and is very disturbed by an event yesterday that she completely forgot about and missed which is unlike her. She states she has felt like this in the past when she has had a urinary infection but denies urinary symptoms.  She also denies any focal weakness, headaches, cp, sob, palpitations.  She has also had similar symptoms with Vitamin B12 deficiency which she has been diagnosed with but has not been taking her supplement.  She additionally states she is Vitamin D deficient and would like both of these levels checked.  She reports decreased sleep but this is chronic and related to menopause.  She has been placed on Losartan at her last office visit with her cardiologist June 1 and reports her blood pressures "have never been so low" and really has not felt well since being placed on this medicine.    The history is provided by the patient and the spouse.    Past Medical History:  Diagnosis Date  . Anemia   . Anxiety   . Carotid artery occlusion   . COPD (chronic obstructive pulmonary disease) ([place])   . Coronary artery disease    LHC 11/25/12 90% stenosis mid LCx & otherwise nonobstructive dz w/ EF 60-65% S/p PTCA/DES to LCx  . Depression   . Exertional dyspnea    chronic  . History of cardiovascular stress test 06/2015   low risk  . Hyperlipidemia   . Hypertension   . MI (myocardial infarction) ([place])   . Tobacco abuse     Patient Active Problem List   Diagnosis Date Noted  . AAA  (abdominal aortic aneurysm) without rupture ([place]) 01/05/2013  . Other malaise and fatigue 01/05/2013  . Palpitations 01/05/2013  . Tobacco use disorder 12/02/2012  . Coronary artery disease   . Hyperlipidemia   . Hypertension   . SHORTNESS OF BREATH 09/04/2009  . CHEST PAIN 09/04/2009    Past Surgical History:  Procedure Laterality Date  . CORONARY ANGIOPLASTY WITH STENT PLACEMENT    . LEFT HEART CATHETERIZATION WITH CORONARY ANGIOGRAM N/A 11/15/2012   Procedure: LEFT HEART CATHETERIZATION WITH CORONARY ANGIOGRAM;  Surgeon: Thayer Headings, MD;  Location: [facility] CATH LAB;  Service: Cardiovascular;  Laterality: N/A;  . PERCUTANEOUS CORONARY STENT INTERVENTION (PCI-S)  11/15/2012   Procedure: PERCUTANEOUS CORONARY STENT INTERVENTION (PCI-S);  Surgeon: Thayer Headings, MD;  Location: [facility] CATH LAB;  Service: Cardiovascular;;  . TONSILLECTOMY    . uterus ablation      OB History    No data available       Home Medications    Prior to Admission medications   Medication Sig Start Date End Date Taking? Authorizing Provider  albuterol (PROVENTIL HFA;VENTOLIN HFA) 108 (90 Base) MCG/ACT inhaler Inhale 2 puffs into the lungs every 4 (four) hours as needed for wheezing or shortness of breath. 12/21/15   Ward, Delice Bison, DO  ALPRAZolam Duanne Moron) 0.5 MG tablet Take 0.5 mg by mouth 2 (two) times daily as  needed for anxiety.    [provider]  aspirin 81 MG tablet Take 81 mg by mouth daily.    [provider]  escitalopram (LEXAPRO) 20 MG tablet Take 1 tablet (20 mg total) by mouth daily. 02/02/14 02/27/17  Leonides Grills, MD  losartan (COZAAR) 25 MG tablet Take 1 tablet (25 mg total) by mouth daily. 02/27/17 05/28/17  Herminio Commons, MD  nitroGLYCERIN (NITROSTAT) 0.4 MG SL tablet Place 1 tablet (0.4 mg total) under the tongue every 5 (five) minutes as needed for chest pain (up to 3 doses). 11/16/12   Hope, Jessica A, PA-C  rosuvastatin (CRESTOR) 10 MG tablet Take 1 tablet (10 mg  total) by mouth at bedtime. 02/27/17   Herminio Commons, MD  vitamin B-12 (CYANOCOBALAMIN) 1000 MCG tablet Take 1,000 mcg by mouth daily.    [provider]    Family History Family History  Problem Relation Age of Onset  . Depression Mother   . Anxiety disorder Maternal Grandmother   . Anxiety disorder Paternal Grandmother   . OCD Paternal Grandmother   . Depression Paternal Grandmother   . Heart attack Father        Deceased, 90  . Cerebral aneurysm Father   . Heart disease Father     Social History Social History  Substance Use Topics  . Smoking status: Current Every Day Smoker    Packs/day: 2.00    Years: 32.00    Types: Cigarettes    Start date: 03/29/1979  . Smokeless tobacco: Never Used     Comment: Currently 2ppd  . Alcohol use No     Allergies   Morphine and related   Review of Systems Review of Systems  Constitutional: Negative for fever.  HENT: Negative for congestion and sore throat.   Eyes: Negative.   Respiratory: Negative for chest tightness and shortness of breath.   Cardiovascular: Negative for chest pain and palpitations.  Gastrointestinal: Positive for nausea. Negative for abdominal pain.  Endocrine: Negative for cold intolerance and heat intolerance.  Genitourinary: Negative.   Musculoskeletal: Negative for arthralgias, joint swelling and neck pain.  Skin: Negative.  Negative for rash and wound.  Neurological: Positive for light-headedness and numbness. Negative for dizziness, seizures, speech difficulty, weakness and headaches.  Psychiatric/Behavioral: Positive for sleep disturbance.     Physical Exam Updated Vital Signs BP 120/72   Pulse 66   Temp 97.9 F (36.6 C) (Oral)   Resp 18   Ht 5\' 5"  (1.651 m)   Wt 119.7 kg (264 lb)   SpO2 100%   BMI 43.93 kg/m   Physical Exam  Constitutional: She is oriented to person, place, and time. She appears well-developed and well-nourished.  obese  HENT:  Head: Normocephalic and  atraumatic.  Eyes: Conjunctivae are normal.  Neck: Normal range of motion.  Cardiovascular: Normal rate, regular rhythm, normal heart sounds and intact distal pulses.   Trace ankle edema, left greater than right.  Pulmonary/Chest: Effort normal and breath sounds normal. She has no wheezes.  Abdominal: Soft. Bowel sounds are normal. There is no tenderness.  Moderately adipose, non tender.  Musculoskeletal: Normal range of motion.  Lymphadenopathy:    She has no cervical adenopathy.  Neurological: She is alert and oriented to person, place, and time. She has normal strength. No cranial nerve deficit or sensory deficit. Coordination normal.  No pronator drift. Equal grip strength.  Skin: Skin is warm and dry.  Psychiatric: She has a normal mood and affect.  Nursing note and vitals reviewed.    ED Treatments / Results  Labs (all labs ordered are listed, but only abnormal results are displayed) Labs Reviewed  CBC WITH DIFFERENTIAL/PLATELET - Abnormal; Notable for the following:       Result Value   RBC 5.15 (*)    All other components within normal limits  COMPREHENSIVE METABOLIC PANEL - Abnormal; Notable for the following:    Glucose, Bld 113 (*)    Creatinine, Ser 1.08 (*)    GFR calc non Af Amer 58 (*)    All other components within normal limits  TROPONIN I  URINALYSIS, ROUTINE W REFLEX MICROSCOPIC    EKG  EKG Interpretation None       Radiology No results found.  Procedures Procedures (including critical care time)  Medications Ordered in ED Medications  ondansetron (ZOFRAN-ODT) disintegrating tablet 4 mg (4 mg Oral Given 03/21/17 2140)     Initial Impression / Assessment and Plan / ED Course  I have reviewed the triage vital signs and the nursing notes.  Pertinent labs & imaging results that were available during my care of the patient were reviewed by me and considered in my medical decision making (see chart for details).     Pt became frustrated by the  fact that her B12 level is not being checked.  Explained that this is not a test typically performed in the ed but she should follow up with her pcp for this test. Ekg was recommended given episodes of lightheadedness.  Pt refused.   Pt was also seen by Dr. Thurnell Garbe during this visit.  Final Clinical Impressions(s) / ED Diagnoses   Final diagnoses:  Lightheadedness    New Prescriptions New Prescriptions   No medications on file     Landis Martins 03/21/17 [place], [place], DO 03/25/17 [phone]

## 2017-03-21 NOTE — ED Notes (Signed)
Pt and husband stated to Jeris Penta RN that patient and husband have asked me for something to eat or drink.  Pt never asked me for anything to eat or drink.  Pt also stated to me " I have $15 in my pocket and I cant find it now.  It must have fallen out.  There was someone that went in the bathroom after me."  RN told pt  " I have not seen any money lying around and if I did, ill let you know".  Pt is in her room counting money.

## 2017-03-21 NOTE — ED Provider Notes (Signed)
Pt agitated with multiple complaints. I went to see pt and her spouse. Pt refusing EKG and CT head. Pt demanding "B12 levels" and other vitamin/mineral testing because that is what she believes her symptoms are due to; also states ED staff was "not taking the role of B12 seriously." ED Charge RN and I explained to pt at length regarding role of ED in continuum of healthcare, as well as scope of ED care. We also reviewed her reassuring dx testing, as well as encouraged her to obtain CT head. Pt continues agitated, stating "nothing was being done" and that she "knows you all see children with colds and treat them." ED RN and I explained EMTALA, and re-explained scope of ED care (ED does not perform specialized testing, this would be the role of her PMD). Again offered CT head and pt refused. Pt picked up her belongings, stated she was "sorry for wasting everyone's time" and began to leave the ED. ED RN and I reiterated multiple times that she was not "wasting our time" and again offered CT head. Pt continues to refuse.    Results for orders placed or performed during the hospital encounter of 03/21/17  CBC with Differential  Result Value Ref Range   WBC 10.5 4.0 - 10.5 K/uL   RBC 5.15 (H) 3.87 - 5.11 MIL/uL   Hemoglobin 14.5 12.0 - 15.0 g/dL   HCT 44.6 36.0 - 46.0 %   MCV 86.6 78.0 - 100.0 fL   MCH 28.2 26.0 - 34.0 pg   MCHC 32.5 30.0 - 36.0 g/dL   RDW 14.8 11.5 - 15.5 %   Platelets 152 150 - 400 K/uL   Neutrophils Relative % 56 %   Neutro Abs 5.9 1.7 - 7.7 K/uL   Lymphocytes Relative 36 %   Lymphs Abs 3.8 0.7 - 4.0 K/uL   Monocytes Relative 6 %   Monocytes Absolute 0.6 0.1 - 1.0 K/uL   Eosinophils Relative 2 %   Eosinophils Absolute 0.2 0.0 - 0.7 K/uL   Basophils Relative 0 %   Basophils Absolute 0.0 0.0 - 0.1 K/uL  Comprehensive metabolic panel  Result Value Ref Range   Sodium 141 135 - 145 mmol/L   Potassium 4.1 3.5 - 5.1 mmol/L   Chloride 106 101 - 111 mmol/L   CO2 26 22 - 32 mmol/L    Glucose, Bld 113 (H) 65 - 99 mg/dL   BUN 12 6 - 20 mg/dL   Creatinine, Ser 1.08 (H) 0.44 - 1.00 mg/dL   Calcium 9.1 8.9 - 10.3 mg/dL   Total Protein 7.4 6.5 - 8.1 g/dL   Albumin 3.8 3.5 - 5.0 g/dL   AST 16 15 - 41 U/L   ALT 20 14 - 54 U/L   Alkaline Phosphatase 79 38 - 126 U/L   Total Bilirubin 0.4 0.3 - 1.2 mg/dL   GFR calc non Af Amer 58 (L) >60 mL/min   GFR calc Af Amer >60 >60 mL/min   Anion gap 9 5 - 15  Troponin I  Result Value Ref Range   Troponin I <0.03 <0.03 ng/mL  Urinalysis, Routine w reflex microscopic  Result Value Ref Range   Color, Urine YELLOW YELLOW   APPearance CLEAR CLEAR   Specific Gravity, Urine 1.009 1.005 - 1.030   pH 5.0 5.0 - 8.0   Glucose, UA NEGATIVE NEGATIVE mg/dL   Hgb urine dipstick NEGATIVE NEGATIVE   Bilirubin Urine NEGATIVE NEGATIVE   Ketones, ur NEGATIVE NEGATIVE mg/dL   Protein,  ur NEGATIVE NEGATIVE mg/dL   Nitrite NEGATIVE NEGATIVE   Leukocytes, UA NEGATIVE NEGATIVE       Francine Graven, DO 03/21/17 2328

## 2017-03-21 NOTE — ED Notes (Signed)
Pt stated to RN " can I ask why a doctor hasnt seen me yet, this is ridiculous"  RN explained to pt that a doctor has to sign up for her as a patient and that we are full in our ER at the moment but a doctor will see her as soon as they can.  Pt responded " so you mean to tell me that a doctor wont come in here to see me.  There were only 4 patient in the waiting room. So I guess I am the least important patient then"  RN explained to pt that she is important to [place] and she will be seen.

## 2017-03-21 NOTE — ED Triage Notes (Signed)
PT states recent change in her HTN medications and since then has been having dizziness spells over the past week.

## 2017-03-21 NOTE — ED Notes (Signed)
Pt refused EKG.  Pt came up to nursing station stating " when is the doctor coming back in because if not, im leaving and I will file a report.  I will leave AMA and I have had nothing done for me here." RN made MD aware.  MD at bedside now.

## 2017-03-21 NOTE — Discharge Instructions (Signed)
Your exam and labs tonight do not give [place] an explanation for your lightheadness and memory concerns, but with the testing we have done this evening, we have found no emergent problems.  I do agree that you would benefit from having your B12 level checked, but this is a test that should be ordered by your primary doctor.  I recommend calling on Monday so you can discuss with him. In the interim, get rechecked for any worsened or new symptoms.

## 2017-03-21 NOTE — ED Notes (Signed)
Pt stated to registration " this is the worst care I have ever received. I have never had to wait. When is the survey coming out so I can complete it."

## 2017-03-21 NOTE — ED Notes (Signed)
Spouse to desk requesting soda and crackers for pt and him  Drinks and crackers provided

## 2017-03-22 NOTE — ED Notes (Signed)
This RN went into room with Dr Thurnell Garbe, Dr Thurnell Garbe spoke with pt and family for several minutes, offered to have ct scan of head performed, RN also attempted to talk to pt about having ct scan performed while at the er now instead of waiting, pt continued to refuse ct scan, pt grabbed her purse and walked out with family,

## 2017-05-12 ENCOUNTER — Encounter: Payer: Self-pay | Admitting: Vascular Surgery

## 2017-05-20 ENCOUNTER — Ambulatory Visit (HOSPITAL_COMMUNITY)
Admission: RE | Admit: 2017-05-20 | Discharge: 2017-05-20 | Disposition: A | Discharge status: Home / Self Care | Payer: Commercial Managed Care - PPO | Source: Ambulatory Visit | Attending: Vascular Surgery | Admitting: Vascular Surgery

## 2017-05-20 ENCOUNTER — Encounter: Payer: Self-pay | Admitting: Vascular Surgery

## 2017-05-20 ENCOUNTER — Ambulatory Visit (INDEPENDENT_AMBULATORY_CARE_PROVIDER_SITE_OTHER): Payer: Commercial Managed Care - PPO | Admitting: Vascular Surgery

## 2017-05-20 ENCOUNTER — Telehealth: Payer: Self-pay | Admitting: Vascular Surgery

## 2017-05-20 VITALS — BP 166/98 | HR 74 | Ht 65.5 in | Wt 266.0 lb

## 2017-05-20 DIAGNOSIS — I6523 Occlusion and stenosis of bilateral carotid arteries: Secondary | ICD-10-CM

## 2017-05-20 DIAGNOSIS — I208 Other forms of angina pectoris: Secondary | ICD-10-CM | POA: Diagnosis not present

## 2017-05-20 DIAGNOSIS — G44009 Cluster headache syndrome, unspecified, not intractable: Secondary | ICD-10-CM

## 2017-05-20 LAB — VAS US CAROTID
LCCADDIAS: -14 cm/s
LCCADSYS: -55 cm/s
LEFT ECA DIAS: -36 cm/s
Left CCA prox dias: 19 cm/s
Left CCA prox sys: 109 cm/s
RCCADSYS: -104 cm/s
RCCAPSYS: 140 cm/s
RIGHT CCA MID DIAS: 28 cm/s
RIGHT ECA DIAS: 39 cm/s
Right CCA prox dias: 31 cm/s

## 2017-05-20 NOTE — Progress Notes (Signed)
Patient name: Shannon [name] MRN: [id number] DOB: July 19, 1966 Sex: female  REASON FOR VISIT:    Follow up with carotid disease.  HPI:   Shannon [name] is a pleasant 51 y.o. female who I saw in consultation on 11/19/2016. The patient had a [facility] screening test in 2016 and was found to have carotid disease. When I saw the patient in February of this year, the patient had a 40-59% right carotid stenosis and the left internal carotid artery was occluded, based on a duplex scan that was done elsewhere. At that time, we discussed the importance of tobacco cessation. The patient was on aspirin and was on a statin. I recommended a follow up duplex can in 6 months.  Since I saw her last, she denies any history of stroke, TIAs, expressive or receptive aphasia, or amaurosis fugax. She does get some paresthesias in both hands and also paresthesias along the lateral aspect of her right thigh. She does have some lumbar disc disease.  She has a significant cardiac history. She had a myocardial infarction in 2014. She's had some recent chest pain in the last 2 weeks. She describes this as chest pressure.   She has also had headaches for the last 2 weeks which are new. She describes a generalized headache with no real aggravating or alleviating factors. She does feel like she's been under increased stress recently.  Past Medical History:  Diagnosis Date  . Anemia   . Anxiety   . Carotid artery occlusion   . COPD (chronic obstructive pulmonary disease) ([place])   . Coronary artery disease    LHC 11/25/12 90% stenosis mid LCx & otherwise nonobstructive dz w/ EF 60-65% S/p PTCA/DES to LCx  . Depression   . Exertional dyspnea    chronic  . History of cardiovascular stress test 06/2015   low risk  . Hyperlipidemia   . Hypertension   . MI (myocardial infarction) ([place])   . Tobacco abuse     Family History  Problem Relation Age of Onset  . Depression Mother   . Anxiety disorder Maternal Grandmother   .  Anxiety disorder Paternal Grandmother   . OCD Paternal Grandmother   . Depression Paternal Grandmother   . Heart attack Father        Deceased, 52  . Cerebral aneurysm Father   . Heart disease Father    She does have a family history of premature cardiovascular disease. Her father died at age 15 from a heart attack.  SOCIAL HISTORY: She smokes 2 packs per day of cigarettes and has been smoking for over 25 years. Social History  Substance Use Topics  . Smoking status: Current Every Day Smoker    Packs/day: 2.00    Years: 32.00    Types: Cigarettes    Start date: 03/29/1979  . Smokeless tobacco: Never Used     Comment: Currently 2ppd  . Alcohol use No    Allergies  Allergen Reactions  . Morphine And Related Other (See Comments)    PATIENT REFUSES THIS MEDICATION: states that she does not tolerate this medication well    Current Outpatient Prescriptions  Medication Sig Dispense Refill  . albuterol (PROVENTIL HFA;VENTOLIN HFA) 108 (90 Base) MCG/ACT inhaler Inhale 2 puffs into the lungs every 4 (four) hours as needed for wheezing or shortness of breath. 1 Inhaler 0  . ALPRAZolam (XANAX) 0.5 MG tablet Take 0.5 mg by mouth 2 (two) times daily as needed for anxiety.    Marland Kitchen aspirin  81 MG tablet Take 81 mg by mouth daily.    Marland Kitchen losartan (COZAAR) 25 MG tablet Take 1 tablet (25 mg total) by mouth daily. 30 tablet 6  . nitroGLYCERIN (NITROSTAT) 0.4 MG SL tablet Place 1 tablet (0.4 mg total) under the tongue every 5 (five) minutes as needed for chest pain (up to 3 doses). 25 tablet 3  . rosuvastatin (CRESTOR) 10 MG tablet Take 1 tablet (10 mg total) by mouth at bedtime. 30 tablet 6  . vitamin B-12 (CYANOCOBALAMIN) 1000 MCG tablet Take 1,000 mcg by mouth daily.    Marland Kitchen escitalopram (LEXAPRO) 20 MG tablet Take 1 tablet (20 mg total) by mouth daily. 30 tablet 0   No current facility-administered medications for this visit.     REVIEW OF SYSTEMS:  [X]  denotes positive finding, [ ]  denotes negative  finding Cardiac  Comments:  Chest pain or chest pressure: X   Shortness of breath upon exertion: X   Short of breath when lying flat:    Irregular heart rhythm:        Vascular    Pain in calf, thigh, or hip brought on by ambulation:    Pain in feet at night that wakes you up from your sleep:     Blood clot in your veins:    Leg swelling:         Pulmonary    Oxygen at home:    Productive cough:  X   Wheezing:  X       Neurologic    Sudden weakness in arms or legs:     Sudden numbness in arms or legs:     Sudden onset of difficulty speaking or slurred speech:    Temporary loss of vision in one eye:     Problems with dizziness:         Gastrointestinal    Blood in stool:     Vomited blood:         Genitourinary    Burning when urinating:     Blood in urine:        Psychiatric    Major depression:  X       Hematologic    Bleeding problems:    Problems with blood clotting too easily:        Skin    Rashes or ulcers:        Constitutional    Fever or chills:     PHYSICAL EXAM:   Vitals:   05/20/17 1435  BP: (!) 166/98  Pulse: 74  SpO2: 98%  Weight: 266 lb (120.7 kg)  Height: 5' 5.5" (1.664 m)    GENERAL: The patient is a well-nourished female, in no acute distress. The vital signs are documented above. CARDIAC: There is a regular rate and rhythm.  VASCULAR: I do not detect carotid bruits. She has no significant lower extremity swelling. PULMONARY: There is good air exchange bilaterally without wheezing or rales. ABDOMEN: Soft and non-tender with normal pitched bowel sounds.  MUSCULOSKELETAL: There are no major deformities or cyanosis. NEUROLOGIC: No focal weakness or paresthesias are detected. SKIN: There are no ulcers or rashes noted. PSYCHIATRIC: The patient has a normal affect.  DATA:    CAROTID DUPLEX: I have independently interpreted her carotid duplex scan in our office today. This shows that in fact the left internal carotid artery is not  occluded but she has an 80-99% stenosis. Peak systolic velocity is 564 cm/s with an end-diastolic velocity of 332 cm/s. On the  right side she has a 40-59% stenosis.  MEDICAL ISSUES:   BILATERAL CAROTID DISEASE: This patient has a greater than 80% left carotid stenosis and a 40-59% right carotid stenosis. She is asymptomatic. Given the severity of the left carotid stenosis I would recommend left carotid endarterectomy in order to lower her risk of future stroke. He is on aspirin and is on a statin. We have discussed the importance of tobacco cessation. I have reviewed the indications for carotid endarterectomy, that is to lower the risk of future stroke. I have also reviewed the potential complications of surgery, including but not limited to: bleeding, stroke (perioperative risk 1-2%), MI, nerve injury of other unpredictable medical problems. All of the patients questions were answered and they are agreeable to proceed with surgery.  Given her recent chest pain however we will obtain preoperative cardiac clearance prior to scheduling her surgery. In addition, given the discrepancy between the duplex scan done elsewhere in the scan done in our office I have also ordered a CT angio of the neck.  CHEST PRESSURE: This patient has been having some chest pressure over the last 2 weeks which is new. She does have a history of a myocardial infarction in 2014. We will make arrangements for her to see her cardiologist for preoperative cardiac evaluation and to workup for chest pain.   HEADACHES: The patient is also been having headaches for the last 2 weeks which is new. There are really no aggravating or alleviating factors. Given her plans for carotid endarterectomy I think that it would be safest to obtain a CT scan of the head to be sure there were not missing anything.   HYPERTENSION: Her blood pressure is also elevated today. She will follow up with her primary care physician on this.  We will get her  scheduled for carotid endarterectomy once her cardiac clearance is complete and her CT Angio of the neck and CT of the head are complete.  Deitra Mayo [facility] [phone]

## 2017-05-20 NOTE — Telephone Encounter (Signed)
Per Dr.Dickson's instructions, I scheduled the patient for a CTA head/neck for 05/22/17 at the [facility] location of [facility]. She is to arrive at 3:10pm for a 3:30pm appt. No solid foods 4 hours prior but liquids and medications are okay.  I also faxed a cardiac clearance form to Dr.Konewaran's office as well. awt

## 2017-05-20 NOTE — Addendum Note (Signed)
Addended by: Lianne Cure A on: 05/20/2017 03:17 PM   Modules accepted: Orders

## 2017-05-22 ENCOUNTER — Ambulatory Visit
Admission: RE | Admit: 2017-05-22 | Discharge: 2017-05-22 | Disposition: A | Discharge status: Home / Self Care | Payer: Commercial Managed Care - PPO | Source: Ambulatory Visit | Attending: Vascular Surgery | Admitting: Vascular Surgery

## 2017-05-22 ENCOUNTER — Other Ambulatory Visit: Payer: Self-pay | Admitting: *Deleted

## 2017-05-22 DIAGNOSIS — I6523 Occlusion and stenosis of bilateral carotid arteries: Secondary | ICD-10-CM

## 2017-05-22 DIAGNOSIS — G44009 Cluster headache syndrome, unspecified, not intractable: Secondary | ICD-10-CM

## 2017-05-22 IMAGING — CT CT ANGIO HEAD
1 of 14 series · 3 of 33 positions shown · IV contrast (isovue)
Comparison: Report of [REDACTED]tid Doppler ultrasound
[DATE] (no images available).
Previous Carotid Doppler ultrasound [DATE]

CLINICAL DATA: 51-year-old female with carotid atherosclerosis.
Reportedly with left ICA occlusion on outside study, but recent [REDACTED]tid Doppler suggesting patency with 80-99%
proximal left ICA stenosis.

EXAM:
CT ANGIOGRAPHY HEAD AND NECK
TECHNIQUE: Multidetector CT imaging of the head and neck was performed using
the standard protocol during bolus administration of intravenous
contrast. Multiplanar CT image reconstructions and MIPs were
obtained to evaluate the vascular anatomy. Carotid stenosis
measurements (when applicable) are obtained utilizing NASCET
criteria, using the distal internal carotid diameter as the
denominator.
CONTRAST:  75 mL Isovue 370

[Series 501: axial thin · axial · 0.54mm/px · z∈[+201,+387]mm · 3 of 372 slices shown]
[im 93/372  soft-tissue]
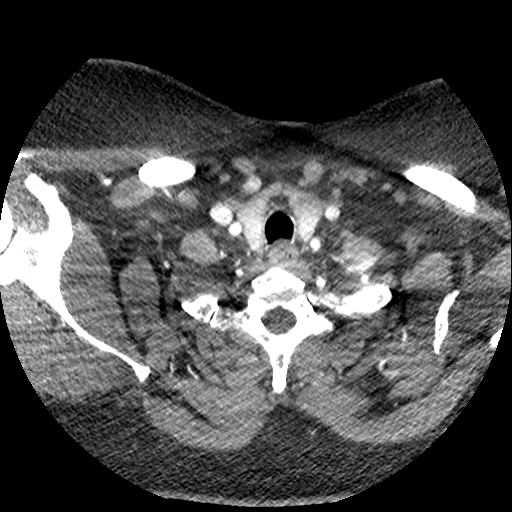
[im 186/372  bone]
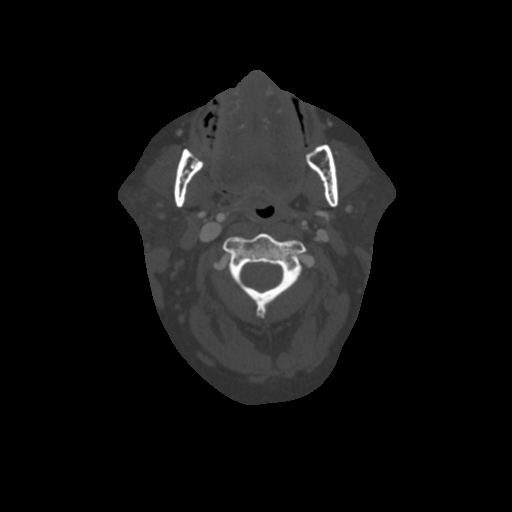
[im 279/372  soft-tissue]
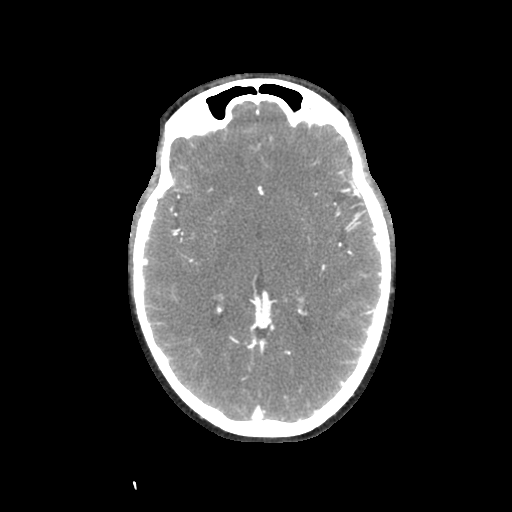

[3 of 33 positions shown; findings below may reference images not displayed]

FINDINGS: CT HEAD

Brain: No midline shift, ventriculomegaly, mass effect, evidence of
mass lesion, intracranial hemorrhage or evidence of cortically based
acute infarction. Gray-white matter differentiation is within normal
limits throughout the brain.

Calvarium and skull base: Negative.

Paranasal sinuses: Clear.

Orbits: Visualized orbits and scalp soft tissues are within normal
limits.

CTA NECK

Skeleton: Absent dentition. Lower cervical spine disc and endplate
degeneration. No acute osseous abnormality identified.

Upper chest: Negative visualized lung parenchyma. No superior
mediastinal lymphadenopathy.

Other neck: Negative thyroid, larynx, pharynx, parapharyngeal
spaces, retropharyngeal space (retropharyngeal course of the
carotids more so the right), sublingual space, submandibular glands
and parotid glands. No cervical lymphadenopathy.

Aortic arch: 3 vessel arch configuration. No arch atherosclerosis or
great vessel origin stenosis.

Right carotid system: Negative right CCA. Partially retropharyngeal
right carotid bifurcation and right ICA origin. Soft and calcified
plaque but no cervical right ICA stenosis.

Left carotid system: Negative although mildly diminutive left CCA.
Soft and calcified plaque at the left carotid bifurcation, and bulky
soft plaque or thrombus filling the left ICA bulb within 5-6 mm of
the origin. Despite this finding the left ICA remains patent but is
diminutive. There is a highly faint string sign along a 12 mm
segment of the vessel as seen on series 607, image 170. The
downstream left ICA measures 3 mm diameter but remains patent to the
skullbase.

Vertebral arteries:No proximal subclavian artery stenosis despite
soft plaque. Soft plaque at the left vertebral artery origin with no
significant stenosis. Some loss of detail of the right vertebral
artery origin and proximal V1 segment (series 606, image 150), but
no definite stenosis. Both vertebral arteries appear somewhat
hypertrophied in the left is dominant. No vertebral artery stenosis
to the skullbase.

CTA HEAD

Posterior circulation: Mildly dominant distal right vertebral artery
with mild V4 segment calcified plaque, but no distal vertebral
stenosis. PICA origins and vertebrobasilar junction are patent. No
basilar stenosis. Normal SCA and PCA origins. Posterior
communicating arteries are diminutive or absent. Normal PCA
branches.

Anterior circulation: Both ICA siphons are patent. There is mild
calcified plaque on the right with no hemodynamically significant
stenosis. The left siphon is diminutive with more moderate calcified
plaque, and mild supraclinoid stenosis, but remains patent to the
left ICA terminus. Patent MCA and ACA origins.

Mildly dominant right ACA A1 segment. Anterior communicating artery
and bilateral ACA branches are within normal limits. The left MCA
also appears mildly diminutive compared to the right but the left M1
segment, bifurcation, and left MCA MCA branches are patent and
otherwise within normal limits. Right MCA M1 segment, bifurcation,
and right MCA branches are within normal limits.

Venous sinuses: Patent.

Anatomic variants: Mildly dominant left vertebral artery, and right
ACA A1 segment.

Delayed phase: No abnormal enhancement identified.

Review of the MIP images confirms the above findings
IMPRESSION: 1. SUBTOTAL OCCLUSION of the Left ICA bulb with a very faint
RADIOGRAPHIC STRING SIGN along a 12 mm segment of the vessel
beginning about 5 mm from the origin. See series 607, image 170.
2. Subsequently, the Left ICA is diminutive but patent throughout
the siphon to the terminus. There is mild superimposed supraclinoid
segment stenosis due to calcified plaque.
3. Patent but mildly diminutive appearing left anterior circulation,
with no other intracranial stenosis.
4. Comparatively mild right ICA atherosclerosis with no significant
stenosis. Mild vertebral artery origin atherosclerosis with no
significant stenosis.
5.  Normal CT appearance of the brain.
6. No acute findings in the neck.

## 2017-05-22 MED ORDER — IOPAMIDOL (ISOVUE-370) INJECTION 76%
75.0000 mL | Freq: Once | INTRAVENOUS | Status: AC | PRN
  Administered 2017-05-22: 75 mL via INTRAVENOUS

## 2017-05-26 ENCOUNTER — Telehealth: Payer: Self-pay

## 2017-05-26 NOTE — Telephone Encounter (Signed)
Spoke with patient regarding disability paperwork received by fax. Patient stated that it was for long term disability. Our office does not participate in long term disability and patient stated that she would come to our office to get last office note. Patient asked that I fax [employer] that we do not participate in long term disability. Fax was sent at 4:26pm.

## 2017-05-29 ENCOUNTER — Telehealth: Payer: Self-pay | Admitting: Vascular Surgery

## 2017-05-29 NOTE — Telephone Encounter (Signed)
Per instructions from [place], I scheduled a six month appt w/ Dr.Dickson and carotid US prior for this patient. Her appt is 11/25/16 at 1pm. I called and LVM for her regarding this and also mailed a letter. awt

## 2017-06-04 ENCOUNTER — Ambulatory Visit: Payer: Self-pay | Admitting: Cardiovascular Disease

## 2017-06-16 ENCOUNTER — Inpatient Hospital Stay: Admit: 2017-06-16 | Payer: Commercial Managed Care - PPO | Admitting: Vascular Surgery

## 2017-06-16 SURGERY — ENDARTERECTOMY, CAROTID
Anesthesia: General | Laterality: Left

## 2017-06-24 ENCOUNTER — Telehealth (HOSPITAL_COMMUNITY): Payer: Self-pay

## 2017-06-24 ENCOUNTER — Ambulatory Visit (HOSPITAL_COMMUNITY): Payer: Commercial Managed Care - PPO | Attending: Physician Assistant

## 2017-06-24 ENCOUNTER — Encounter (HOSPITAL_COMMUNITY): Payer: Self-pay

## 2017-06-24 DIAGNOSIS — R29898 Other symptoms and signs involving the musculoskeletal system: Secondary | ICD-10-CM | POA: Diagnosis present

## 2017-06-24 DIAGNOSIS — M542 Cervicalgia: Secondary | ICD-10-CM | POA: Diagnosis present

## 2017-06-24 NOTE — Therapy (Signed)
[place], [place], [address] Phone: [phone]   Fax:  [phone]  Occupational Therapy Evaluation  Patient Details  Name: Shannon [name] MRN: [id number] Date of Birth: October 16, 1965 Referring Provider: Cline Crock, PA-C  Encounter Date: 06/24/2017      OT End of Session - 06/24/17 1750    Visit Number 1   Number of Visits 8   Date for OT Re-Evaluation 07/24/17   Authorization Type [employer] $60 copay per visit (pt requesting to be billed)   OT Start Time 1120   OT Stop Time 1214   OT Time Calculation (min) 54 min   Activity Tolerance Patient tolerated treatment well   Behavior During Therapy [facility] for tasks assessed/performed      Past Medical History:  Diagnosis Date  . Anemia   . Anxiety   . Carotid artery occlusion   . COPD (chronic obstructive pulmonary disease) ([place])   . Coronary artery disease    LHC 11/25/12 90% stenosis mid LCx & otherwise nonobstructive dz w/ EF 60-65% S/p PTCA/DES to LCx  . Depression   . Exertional dyspnea    chronic  . History of cardiovascular stress test 06/2015   low risk  . Hyperlipidemia   . Hypertension   . MI (myocardial infarction) ([place])   . Tobacco abuse     Past Surgical History:  Procedure Laterality Date  . CORONARY ANGIOPLASTY WITH STENT PLACEMENT    . LEFT HEART CATHETERIZATION WITH CORONARY ANGIOGRAM N/A 11/15/2012   Procedure: LEFT HEART CATHETERIZATION WITH CORONARY ANGIOGRAM;  Surgeon: Thayer Headings, MD;  Location: [facility] CATH LAB;  Service: Cardiovascular;  Laterality: N/A;  . PERCUTANEOUS CORONARY STENT INTERVENTION (PCI-S)  11/15/2012   Procedure: PERCUTANEOUS CORONARY STENT INTERVENTION (PCI-S);  Surgeon: Thayer Headings, MD;  Location: [facility] CATH LAB;  Service: Cardiovascular;;  . TONSILLECTOMY    . uterus ablation      There were no vitals filed for this visit.      Subjective Assessment - 06/24/17 1123    Subjective  S: I was diagnosed last  July but recently it's been getting worse.    Pertinent History Patient is a 51 y/o female S/P cervical pain and possible carpal tunnel syndrome in bilateral hands. Pt reports that we was diagnosed with DDD July 2017 and CTS June 2017. She reports that the MD is not 100% sure that it's CTS and wants to see if therapy will help her neck and hand pain before discussing surgery. Cline Crock, PA-C has referred patient to occupational therapy for evaluation and treatment.    Special Tests FOTO to be completed next session.    Patient Stated Goals To decrease pain and be able to use her hands as normal as possible.    Currently in Pain? Yes   Pain Score 6    Pain Location Neck   Pain Orientation Right;Lateral;Posterior   Pain Descriptors / Indicators Tiring;Heaviness;Tightness;Squeezing   Pain Type Chronic pain   Aggravating Factors  Unsupported (neck), driving for a long, sleeping flat.   Pain Relieving Factors Heat    Effect of Pain on Daily Activities Max effect   Multiple Pain Sites Yes   Pain Score 5   Pain Location Hand   Pain Orientation Right;Left   Pain Descriptors / Indicators Cramping;Pins and needles;Numbness   Pain Type Chronic pain   Pain Onset Other (comment)   Pain Frequency Intermittent   Aggravating Factors  Increased use, washing  dishes, holding items, washing hair, raising arms, driving.    Pain Relieving Factors unknown   Effect of Pain on Daily Activities Max effect           [facility] OT Assessment - 06/24/17 1114      Assessment   Diagnosis Cervical Pain/Bilateral hand numbness   Referring Provider Cline Crock, PA-C   Onset Date --  June/July 2017   Prior Therapy Pt received OT services at [facility] orthropedic when diagnosed. Approximately 6-8 sessions completed.      Precautions   Precautions Other (comment)   Precaution Comments 100% blocked carotid artery on left side of neck     Restrictions   Weight Bearing Restrictions No     Balance  Screen   Has the patient fallen in the past 6 months No     Home  Environment   Family/patient expects to be discharged to: Private residence     Prior Function   Level of Independence Independent     ADL   ADL comments Drops everything, holding hairbrush, unable to sleep in bed and instead sleeps sitting upright in recliner.      Mobility   Mobility Status Independent     Written Expression   Dominant Hand Right     Cognition   Overall Cognitive Status Within Functional Limits for tasks assessed     Sensation   Light Touch Appears Intact   Stereognosis Impaired by gross assessment   Hot/Cold Appears Intact   Proprioception Appears Intact     ROM / Strength   AROM / PROM / Strength AROM;PROM;Strength     Palpation   Palpation comment Max fascial restrictions on right lateral cervical region and upper trapezius region.      AROM   Overall AROM Comments Assessed seated. Shoulder A/ROM WFL.   AROM Assessment Site Cervical   Cervical Flexion 40   Cervical Extension 22   Cervical - Right Side Bend 29   Cervical - Left Side Bend 36   Cervical - Right Rotation 45   Cervical - Left Rotation 63     Strength   Overall Strength Comments Assessed seated. IR/er adducted.   Strength Assessment Site Shoulder;Hand   Right/Left Shoulder Right;Left   Right Shoulder Flexion 4+/5   Right Shoulder ABduction 4/5   Right Shoulder Internal Rotation 4+/5   Right Shoulder External Rotation 4+/5   Left Shoulder Flexion 4+/5   Left Shoulder ABduction 4+/5   Left Shoulder Internal Rotation 5/5   Left Shoulder External Rotation 5/5   Right/Left hand Left;Right   Right Hand Grip (lbs) 35   Right Hand Lateral Pinch 12 lbs   Right Hand 3 Point Pinch 16 lbs   Left Hand Grip (lbs) 31   Left Hand Lateral Pinch 9 lbs   Left Hand 3 Point Pinch 10 lbs                         OT Education - 06/24/17 1746    Education provided Yes   Education Details cervical stretches and  scapular strengthening with red band.   Person(s) Educated Patient   Methods Explanation;Demonstration;Handout;Verbal cues   Comprehension Returned demonstration;Verbalized understanding          OT Short Term Goals - 06/24/17 1756      OT SHORT TERM GOAL #1   Title Patient will be educated and independent with HEP to faciliate progress in therapy and allow her to complete  her ADL tasks with increased independence.   Time 4   Period Weeks   Status New   Target Date 07/24/17     OT SHORT TERM GOAL #2   Title Patient will increase cervical ROM by 10 degrees where improvement is needed in order to look both ways and behind her when driving with less difficulty.    Time 4   Period Weeks   Status New     OT SHORT TERM GOAL #3   Title Patient will decrease pain in cervical region to 3/10 or less to increase ability sleep with more comfort.   Time 3   Period Weeks   Status New     OT SHORT TERM GOAL #4   Title Patient will decrease fascial restrictions in cervical region to min amount or less to increase functional mobility needed to complete daily tasks.    Time 4   Period Weeks   Status New     OT SHORT TERM GOAL #5   Title Patient will be educated on safety awareness related to bilateral hand numbness in order to prevent serious injury when completing daily tasks.    Time 4   Period Weeks   Status New                  Plan - 06/24/17 1751    Clinical Impression Statement A: Patient is a 51 y/o female S/P cervical pain and bilateral hand numbness causing increased fascial restrictions, pain and decreased cervical ROM resulting in difficulty completing ADL tasks. Discussed expected outcomes of therapy and diagnosis of DDD.    Occupational Profile and client history currently impacting functional performance Motivated to return to PLOF, independent prior issue.   Occupational performance deficits (Please refer to evaluation for details): ADL's;Rest and Sleep;Leisure    Rehab Potential Excellent   Current Impairments/barriers affecting progress: Chronic issue now due to length of time. Other medical issues.   OT Frequency 2x / week   OT Duration 4 weeks   OT Treatment/Interventions Self-care/ADL training;Therapeutic exercise;Patient/family education;Manual Therapy;Ultrasound;Therapeutic activities;Cryotherapy;Electrical Stimulation;Moist Heat;Passive range of motion;DME and/or AE instruction   Plan P: Pt will benefit from skilled OT services to increase functional performance during ADL tasks and increase overall comfort level. Treatment plan: myofascial release, manual stretching of cervical region. cervical stretches, scapular strengthening and posture training, grip strengthening.    Clinical Decision Making Limited treatment options, no task modification necessary   Consulted and Agree with Plan of Care Patient      Patient will benefit from skilled therapeutic intervention in order to improve the following deficits and impairments:     Visit Diagnosis: Other symptoms and signs involving the musculoskeletal system - Plan: Ot plan of care cert/re-cert  Cervicalgia - Plan: Ot plan of care cert/re-cert    Problem List Patient Active Problem List   Diagnosis Date Noted  . AAA (abdominal aortic aneurysm) without rupture ([place]) 01/05/2013  . Other malaise and fatigue 01/05/2013  . Palpitations 01/05/2013  . Tobacco use disorder 12/02/2012  . Coronary artery disease   . Hyperlipidemia   . Hypertension   . SHORTNESS OF BREATH 09/04/2009  . CHEST PAIN 09/04/2009   Ailene Ravel, OTR/L,CBIS  [phone]  06/24/2017, 6:06 PM  [place] [address] [place], [place], [address] Phone: [phone]   Fax:  [phone]  Name: ELLERY TASH MRN: [id number] Date of Birth: 1966-09-07

## 2017-06-24 NOTE — Patient Instructions (Signed)
   Flexibility: Neck Retraction   Pull head straight back, keeping eyes and jaw level. *Give yourself a double chin.* Hold 15-30 seconds. Repeat 10 times.   Lower Cervical / Upper Thoracic Stretch   Clasp hands together in front with arms extended. Gently pull shoulder blades apart and bend head forward. Hold 15-30 seconds. Repeat 10 times.  [place] Stretch, Sitting  Repeat __10_ times per session. Do _1-2__ sessions per day.  Copyright  VHI. All rights reserved.  Scalene Stretch, Sitting   Sit, one hand tucked under hip on side to be stretched, other hand over top of head. Gently pull head to side and backwards. Hold _15-30__ seconds. For more stretch, lean body in direction of head pull. Repeat ___ times per session. Do ___ sessions per day.  Copyright  VHI. All rights reserved.  Side Bend, Standing   Stand, one hand grasping other arm above wrist behind body. Pull down while gently tilting head toward pulling side. Hold _15-30__ seconds. Repeat ___ times per session. Do ___ sessions per day.   (Home) Extension: Isometric / Bilateral Arm Retraction - Sitting   Facing anchor, hold hands and elbow at shoulder height, with elbow bent.  Pull arms back to squeeze shoulder blades together. Repeat 10-15 times. 1-3 times/day.   Copyright  VHI. All rights reserved.   (Home) Retraction: Row - Bilateral (Anchor)   Facing anchor, arms reaching forward, pull hands toward stomach, keeping elbows bent and at your sides and pinching shoulder blades together. Repeat 10-15 times. 1-3 times/day.   Copyright  VHI. All rights reserved.   (Clinic) Extension / Flexion (Assist)   Face anchor, pull arms back, keeping elbow straight, and squeze shoulder blades together. Repeat 10-15 times. 1-3 times/day.   Copyright  VHI. All rights reserved.

## 2017-06-24 NOTE — Telephone Encounter (Signed)
OT 2xwk/4wks L/m for pt to call us back to  schedule. NF  06/24/17

## 2017-06-29 ENCOUNTER — Ambulatory Visit (HOSPITAL_COMMUNITY): Payer: Commercial Managed Care - PPO | Attending: Physician Assistant

## 2017-06-29 ENCOUNTER — Encounter (HOSPITAL_COMMUNITY): Payer: Self-pay

## 2017-06-29 DIAGNOSIS — M542 Cervicalgia: Secondary | ICD-10-CM | POA: Diagnosis not present

## 2017-06-29 DIAGNOSIS — R29898 Other symptoms and signs involving the musculoskeletal system: Secondary | ICD-10-CM | POA: Diagnosis present

## 2017-06-29 NOTE — Therapy (Addendum)
[place], [place], [address] Phone: [phone]   Fax:  [phone]  Occupational Therapy Treatment  Patient Details  Name: Shannon [name] MRN: [id number] Date of Birth: 06-Feb-1966 Referring Provider: Cline Crock, PA-C  Encounter Date: 06/29/2017      OT End of Session - 06/29/17 0200    Visit Number 2   Number of Visits 8   Date for OT Re-Evaluation 07/24/17   Authorization Type UMR $60 copay per visit (pt requesting to be billed)   OT Start Time 1128  Pt arrived late   OT Stop Time 1200   OT Time Calculation (min) 32 min   Activity Tolerance Patient tolerated treatment well   Behavior During Therapy [facility] for tasks assessed/performed      Past Medical History:  Diagnosis Date  . Anemia   . Anxiety   . Carotid artery occlusion   . COPD (chronic obstructive pulmonary disease) ([place])   . Coronary artery disease    LHC 11/25/12 90% stenosis mid LCx & otherwise nonobstructive dz w/ EF 60-65% S/p PTCA/DES to LCx  . Depression   . Exertional dyspnea    chronic  . History of cardiovascular stress test 06/2015   low risk  . Hyperlipidemia   . Hypertension   . MI (myocardial infarction) ([place])   . Tobacco abuse     Past Surgical History:  Procedure Laterality Date  . CORONARY ANGIOPLASTY WITH STENT PLACEMENT    . LEFT HEART CATHETERIZATION WITH CORONARY ANGIOGRAM N/A 11/15/2012   Procedure: LEFT HEART CATHETERIZATION WITH CORONARY ANGIOGRAM;  Surgeon: Thayer Headings, MD;  Location: [facility] CATH LAB;  Service: Cardiovascular;  Laterality: N/A;  . PERCUTANEOUS CORONARY STENT INTERVENTION (PCI-S)  11/15/2012   Procedure: PERCUTANEOUS CORONARY STENT INTERVENTION (PCI-S);  Surgeon: Thayer Headings, MD;  Location: [facility] CATH LAB;  Service: Cardiovascular;;  . TONSILLECTOMY    . uterus ablation      There were no vitals filed for this visit.      Subjective Assessment - 06/29/17 1319    Subjective  S: What  you did last time really helped, I think.    Currently in Pain? Yes   Pain Score 5    Pain Location Neck   Pain Orientation Right;Lateral;Posterior   Pain Descriptors / Indicators Constant;Tightness;Squeezing   Pain Type Chronic pain   Pain Onset More than a month ago   Pain Frequency Constant            [facility] OT Assessment - 06/29/17 1323      Assessment   Diagnosis Cervical Pain/Bilateral hand numbness     Precautions   Precautions Other (comment)   Precaution Comments 100% blocked carotid artery on left side of neck                  OT Treatments/Exercises (OP) - 06/29/17 1147      Exercises   Exercises Neck;Shoulder     Neck Exercises: Supine   Cervical Isometrics Flexion;Extension;Left rotation;Right rotation;5 secs  3X   Cervical Isometrics Limitations weaker more for flexion   Neck Retraction 10 reps     Shoulder Exercises: Standing   Extension Theraband;12 reps   Theraband Level (Shoulder Extension) Level 2 (Red)   Row Theraband;12 reps   Theraband Level (Shoulder Row) Level 2 (Red)   Retraction Theraband;12 reps   Theraband Level (Shoulder Retraction) Level 2 (Red)     Shoulder Exercises: ROM/Strengthening   UBE (Upper  Arm Bike) Level 1 2' reverse     Manual Therapy   Manual Therapy Myofascial release   Manual therapy comments Manual therapy completed prior to exercises.    Myofascial Release Myofascial release and manual stretching completed to bilateral cervical region to decrease fascial restrictions and increase joint mobility in a pain free zone.                 OT Education - 06/29/17 1325    Education provided Yes   Education Details Pt was given handout of OT evaluation with goals and plan of care reviewed.    Person(s) Educated Patient   Methods Explanation;Handout   Comprehension Verbalized understanding          OT Short Term Goals - 06/29/17 1327      OT SHORT TERM GOAL #1   Title Patient will be educated and  independent with HEP to faciliate progress in therapy and allow her to complete her ADL tasks with increased independence.   Time 4   Period Weeks   Status On-going     OT SHORT TERM GOAL #2   Title Patient will increase cervical ROM by 10 degrees where improvement is needed in order to look both ways and behind her when driving with less difficulty.    Time 4   Period Weeks   Status On-going     OT SHORT TERM GOAL #3   Title Patient will decrease pain in cervical region to 3/10 or less to increase ability sleep with more comfort.   Time 4   Period Weeks   Status On-going     OT SHORT TERM GOAL #4   Title Patient will decrease fascial restrictions in cervical region to min amount or less to increase functional mobility needed to complete daily tasks.    Time 4   Period Weeks   Status On-going     OT SHORT TERM GOAL #5   Title Patient will be educated on safety awareness related to bilateral hand numbness in order to prevent serious injury when completing daily tasks.    Time 4   Period Weeks   Status On-going          06/29/17 1326  OT Assessment and Plan  Clinical Impression Statement A: Intiated myofascial release, manual stretching, A/ROM of cervical spine. Patient had less fascial restrictions this session compared to evaluation. Continues to have cervical pain when rotating to the left.   Pt will benefit from skilled therapeutic intervention in order to improve on the following deficits  Decreased strength;Pain;Decreased range of motion;Increased fascial restricitons;Impaired UE functional use  Plan P: Continue with myofascial release to bilateral trapezius and cervical region. Cervical stretches.           Patient will benefit from skilled therapeutic intervention in order to improve the following deficits and impairments:  Decreased strength, Pain, Decreased range of motion, Increased fascial restricitons, Impaired UE functional use  Visit  Diagnosis: Cervicalgia  Other symptoms and signs involving the musculoskeletal system    Problem List Patient Active Problem List   Diagnosis Date Noted  . AAA (abdominal aortic aneurysm) without rupture ([place]) 01/05/2013  . Other malaise and fatigue 01/05/2013  . Palpitations 01/05/2013  . Tobacco use disorder 12/02/2012  . Coronary artery disease   . Hyperlipidemia   . Hypertension   . SHORTNESS OF BREATH 09/04/2009  . CHEST PAIN 09/04/2009   Ailene Ravel, OTR/L,CBIS  [phone]  06/29/2017, 1:29 PM  [facility] [facility] [address] [place], [place], [address] Phone: [phone]   Fax:  [phone]  Name: Shannon [name] MRN: [id number] Date of Birth: 10/13/1965

## 2017-07-02 ENCOUNTER — Ambulatory Visit (HOSPITAL_COMMUNITY): Payer: Commercial Managed Care - PPO | Admitting: Occupational Therapy

## 2017-07-02 ENCOUNTER — Encounter (HOSPITAL_COMMUNITY): Payer: Self-pay | Admitting: Occupational Therapy

## 2017-07-02 DIAGNOSIS — R29898 Other symptoms and signs involving the musculoskeletal system: Secondary | ICD-10-CM

## 2017-07-02 DIAGNOSIS — M542 Cervicalgia: Secondary | ICD-10-CM

## 2017-07-02 NOTE — Therapy (Signed)
[place], [place], [address] Phone: [phone]   Fax:  [phone]  Occupational Therapy Treatment  Patient Details  Name: Shannon [name] MRN: [id number] Date of Birth: [birth year] Referring Provider: Cline Crock, PA-C  Encounter Date: 07/02/2017      OT End of Session - 07/02/17 1622    Visit Number 3   Number of Visits 8   Date for OT Re-Evaluation 07/24/17   Authorization Type UMR $60 copay per visit (pt requesting to be billed)   OT Start Time 1520   OT Stop Time 1600   OT Time Calculation (min) 40 min   Activity Tolerance Patient tolerated treatment well   Behavior During Therapy [facility] for tasks assessed/performed      Past Medical History:  Diagnosis Date  . Anemia   . Anxiety   . Carotid artery occlusion   . COPD (chronic obstructive pulmonary disease) ([place])   . Coronary artery disease    LHC 11/25/12 90% stenosis mid LCx & otherwise nonobstructive dz w/ EF 60-65% S/p PTCA/DES to LCx  . Depression   . Exertional dyspnea    chronic  . History of cardiovascular stress test 06/2015   low risk  . Hyperlipidemia   . Hypertension   . MI (myocardial infarction) ([place])   . Tobacco abuse     Past Surgical History:  Procedure Laterality Date  . CORONARY ANGIOPLASTY WITH STENT PLACEMENT    . LEFT HEART CATHETERIZATION WITH CORONARY ANGIOGRAM N/A 11/15/2012   Procedure: LEFT HEART CATHETERIZATION WITH CORONARY ANGIOGRAM;  Surgeon: Thayer Headings, MD;  Location: [facility] CATH LAB;  Service: Cardiovascular;  Laterality: N/A;  . PERCUTANEOUS CORONARY STENT INTERVENTION (PCI-S)  11/15/2012   Procedure: PERCUTANEOUS CORONARY STENT INTERVENTION (PCI-S);  Surgeon: Thayer Headings, MD;  Location: [facility] CATH LAB;  Service: Cardiovascular;;  . TONSILLECTOMY    . uterus ablation      There were no vitals filed for this visit.      Subjective Assessment - 07/02/17 1521    Subjective  S: It all feels about the  same.    Currently in Pain? Yes   Pain Score 7    Pain Location Neck   Pain Orientation Right;Lateral;Posterior   Pain Descriptors / Indicators Constant;Tightness;Squeezing   Pain Type Chronic pain   Pain Radiating Towards n/a   Pain Onset More than a month ago   Pain Frequency Constant   Aggravating Factors  unsupported (neck), driving for a long time, sleeping flat   Pain Relieving Factors heat   Effect of Pain on Daily Activities max effect on ADL completion   Multiple Pain Sites No            [facility] OT Assessment - 07/02/17 1521      Assessment   Diagnosis Cervical Pain/Bilateral hand numbness     Precautions   Precautions Other (comment)   Precaution Comments 100% blocked carotid artery on left side of neck                  OT Treatments/Exercises (OP) - 07/02/17 1524      Exercises   Exercises Neck;Shoulder     Neck Exercises: Stretches   Upper Trapezius Stretch 3 reps;10 seconds  both directions   Levator Stretch 3 reps;10 seconds  both directions   Lower Cervical/Upper Thoracic Stretch 3 reps;10 seconds     Neck Exercises: Seated   Neck Retraction 10 reps  Neck Exercises: Supine   Cervical Isometrics Flexion;Extension;Left rotation;Right rotation;5 secs  3X   Cervical Isometrics Limitations weaker more for flexion   Neck Retraction 10 reps     Shoulder Exercises: Standing   Extension Theraband;12 reps   Theraband Level (Shoulder Extension) Level 2 (Red)   Row Theraband;12 reps   Theraband Level (Shoulder Row) Level 2 (Red)   Retraction Theraband;12 reps   Theraband Level (Shoulder Retraction) Level 2 (Red)     Manual Therapy   Manual Therapy Myofascial release   Manual therapy comments Manual therapy completed prior to exercises.    Myofascial Release Myofascial release and manual stretching completed to bilateral cervical region to decrease fascial restrictions and increase joint mobility in a pain free zone.                    OT Short Term Goals - 06/29/17 1327      OT SHORT TERM GOAL #1   Title Patient will be educated and independent with HEP to faciliate progress in therapy and allow her to complete her ADL tasks with increased independence.   Time 4   Period Weeks   Status On-going     OT SHORT TERM GOAL #2   Title Patient will increase cervical ROM by 10 degrees where improvement is needed in order to look both ways and behind her when driving with less difficulty.    Time 4   Period Weeks   Status On-going     OT SHORT TERM GOAL #3   Title Patient will decrease pain in cervical region to 3/10 or less to increase ability sleep with more comfort.   Time 4   Period Weeks   Status On-going     OT SHORT TERM GOAL #4   Title Patient will decrease fascial restrictions in cervical region to min amount or less to increase functional mobility needed to complete daily tasks.    Time 4   Period Weeks   Status On-going     OT SHORT TERM GOAL #5   Title Patient will be educated on safety awareness related to bilateral hand numbness in order to prevent serious injury when completing daily tasks.    Time 4   Period Weeks   Status On-going                  Plan - 07/02/17 1622    Clinical Impression Statement A: Added cervical stretches this date, pt reports more pain on the right side when completing trapezius stretch towards the right. Pt reports she is completing the chin tucks at home, occasionally completes remaining exercises from HEP. Verbal and visual cuing for form and technique with new exercises.    Plan P: continue with myofasical release and cervical stretches, follow up on HEP completion. Add prot/ret/elev/dep for scapular mobility   Consulted and Agree with Plan of Care Patient      Patient will benefit from skilled therapeutic intervention in order to improve the following deficits and impairments:  Decreased strength, Pain, Decreased range of motion,  Increased fascial restricitons, Impaired UE functional use  Visit Diagnosis: Cervicalgia  Other symptoms and signs involving the musculoskeletal system    Problem List Patient Active Problem List   Diagnosis Date Noted  . AAA (abdominal aortic aneurysm) without rupture ([facility]) 01/05/2013  . Other malaise and fatigue 01/05/2013  . Palpitations 01/05/2013  . Tobacco use disorder 12/02/2012  . Coronary artery disease   . Hyperlipidemia   .  Hypertension   . SHORTNESS OF BREATH 09/04/2009  . CHEST PAIN 09/04/2009   Guadelupe Sabin, OTR/L  [phone] 07/02/2017, 4:26 PM  [facility] [address] [place], [place], [address] Phone: [phone]   Fax:  [phone]  Name: KENT BRAUNSCHWEIG MRN: [id number] Date of Birth: Feb 01, 1966

## 2017-07-06 ENCOUNTER — Ambulatory Visit (HOSPITAL_COMMUNITY): Payer: Commercial Managed Care - PPO

## 2017-07-07 ENCOUNTER — Telehealth (HOSPITAL_COMMUNITY): Payer: Self-pay | Admitting: Occupational Therapy

## 2017-07-07 NOTE — Telephone Encounter (Signed)
07-07-17 Patient called today to make sure we canceled her appt so it wont count as a noshow. She talk to Bartelso about it because there was'nt anyone at the desk to cancel it for her

## 2017-07-09 ENCOUNTER — Ambulatory Visit (HOSPITAL_COMMUNITY): Payer: Commercial Managed Care - PPO

## 2017-07-13 ENCOUNTER — Ambulatory Visit (HOSPITAL_COMMUNITY): Payer: Commercial Managed Care - PPO

## 2017-07-13 ENCOUNTER — Encounter (HOSPITAL_COMMUNITY): Payer: Self-pay

## 2017-07-13 DIAGNOSIS — M542 Cervicalgia: Secondary | ICD-10-CM

## 2017-07-13 DIAGNOSIS — R29898 Other symptoms and signs involving the musculoskeletal system: Secondary | ICD-10-CM

## 2017-07-13 NOTE — Therapy (Signed)
[facility] [address] [place], [place], [address] Phone: [phone]   Fax:  [phone]  Occupational Therapy Treatment  Patient Details  Name: Shannon [name] MRN: [id number] Date of Birth: 1966/07/21 Referring Provider: Cline Crock, PA-C  Encounter Date: 07/13/2017      OT End of Session - 07/13/17 1752    Visit Number 4   Number of Visits 8   Date for OT Re-Evaluation 07/24/17   Authorization Type UMR $60 copay per visit (pt requesting to be billed)   OT Start Time 1656  Pt arrived late   OT Stop Time 1733   OT Time Calculation (min) 37 min   Activity Tolerance Patient tolerated treatment well   Behavior During Therapy [facility] for tasks assessed/performed      Past Medical History:  Diagnosis Date  . Anemia   . Anxiety   . Carotid artery occlusion   . COPD (chronic obstructive pulmonary disease) ([place])   . Coronary artery disease    LHC 11/25/12 90% stenosis mid LCx & otherwise nonobstructive dz w/ EF 60-65% S/p PTCA/DES to LCx  . Depression   . Exertional dyspnea    chronic  . History of cardiovascular stress test 06/2015   low risk  . Hyperlipidemia   . Hypertension   . MI (myocardial infarction) ([place])   . Tobacco abuse     Past Surgical History:  Procedure Laterality Date  . CORONARY ANGIOPLASTY WITH STENT PLACEMENT    . LEFT HEART CATHETERIZATION WITH CORONARY ANGIOGRAM N/A 11/15/2012   Procedure: LEFT HEART CATHETERIZATION WITH CORONARY ANGIOGRAM;  Surgeon: Thayer Headings, MD;  Location: [facility] CATH LAB;  Service: Cardiovascular;  Laterality: N/A;  . PERCUTANEOUS CORONARY STENT INTERVENTION (PCI-S)  11/15/2012   Procedure: PERCUTANEOUS CORONARY STENT INTERVENTION (PCI-S);  Surgeon: Thayer Headings, MD;  Location: [facility] CATH LAB;  Service: Cardiovascular;;  . TONSILLECTOMY    . uterus ablation      There were no vitals filed for this visit.      Subjective Assessment - 07/13/17 1727    Subjective  S: My  numbness may be a little bit better than it was.    Currently in Pain? Yes   Pain Score 8    Pain Location Neck   Pain Orientation Right;Lateral;Posterior   Pain Descriptors / Indicators Constant;Squeezing;Tightness   Pain Type Chronic pain   Pain Radiating Towards n/a   Pain Onset More than a month ago   Pain Frequency Constant   Aggravating Factors  unsupported (neck), driving for a long time, sleeping flat   Pain Relieving Factors heat   Effect of Pain on Daily Activities max effect on ADL completion            [facility] OT Assessment - 07/13/17 1728      Assessment   Diagnosis Cervical Pain/Bilateral hand numbness     Precautions   Precautions Other (comment)   Precaution Comments 100% blocked carotid artery on left side of neck                  OT Treatments/Exercises (OP) - 07/13/17 1729      Exercises   Exercises Neck;Shoulder     Neck Exercises: Stretches   Upper Trapezius Stretch 2 reps;10 seconds  bilateral     Neck Exercises: Supine   Cervical Rotation Both;5 reps   Lateral Flexion Both;5 reps     Shoulder Exercises: Supine   Flexion AROM;10 reps   Other  Supine Exercises Serratus anterior punch; 10X                  OT Short Term Goals - 06/29/17 1327      OT SHORT TERM GOAL #1   Title Patient will be educated and independent with HEP to faciliate progress in therapy and allow her to complete her ADL tasks with increased independence.   Time 4   Period Weeks   Status On-going     OT SHORT TERM GOAL #2   Title Patient will increase cervical ROM by 10 degrees where improvement is needed in order to look both ways and behind her when driving with less difficulty.    Time 4   Period Weeks   Status On-going     OT SHORT TERM GOAL #3   Title Patient will decrease pain in cervical region to 3/10 or less to increase ability sleep with more comfort.   Time 4   Period Weeks   Status On-going     OT SHORT TERM GOAL #4   Title  Patient will decrease fascial restrictions in cervical region to min amount or less to increase functional mobility needed to complete daily tasks.    Time 4   Period Weeks   Status On-going     OT SHORT TERM GOAL #5   Title Patient will be educated on safety awareness related to bilateral hand numbness in order to prevent serious injury when completing daily tasks.    Time 4   Period Weeks   Status On-going                  Plan - 07/13/17 1752    Clinical Impression Statement A: Pt presented with less fascial restrictions on the right upper trapezius region although showed increased fascial restrictions on the left side. Pt was able to achieve full passive cervical ROM supine this session. Reports a decreased pain level of 5 or 6 out of 10.   Plan P: Complete CTS testing and nerve testing. Provide appropriate nerve glides if needed.       Patient will benefit from skilled therapeutic intervention in order to improve the following deficits and impairments:     Visit Diagnosis: Cervicalgia  Other symptoms and signs involving the musculoskeletal system    Problem List Patient Active Problem List   Diagnosis Date Noted  . AAA (abdominal aortic aneurysm) without rupture ([place]) 01/05/2013  . Other malaise and fatigue 01/05/2013  . Palpitations 01/05/2013  . Tobacco use disorder 12/02/2012  . Coronary artery disease   . Hyperlipidemia   . Hypertension   . SHORTNESS OF BREATH 09/04/2009  . CHEST PAIN 09/04/2009   Ailene Ravel, OTR/L,CBIS  [phone]  07/13/2017, 5:56 PM  [facility] [address] [place], [place], [address] Phone: [phone]   Fax:  [phone]  Name: Shannon [name] MRN: [id number] Date of Birth: 05-26-1966

## 2017-07-16 ENCOUNTER — Ambulatory Visit (HOSPITAL_COMMUNITY): Payer: Commercial Managed Care - PPO

## 2017-07-16 ENCOUNTER — Encounter (HOSPITAL_COMMUNITY): Payer: Self-pay

## 2017-07-16 DIAGNOSIS — M542 Cervicalgia: Secondary | ICD-10-CM

## 2017-07-16 DIAGNOSIS — R29898 Other symptoms and signs involving the musculoskeletal system: Secondary | ICD-10-CM

## 2017-07-16 NOTE — Therapy (Signed)
[place], [place], [address] Phone: [phone]   Fax:  [phone]  Occupational Therapy Treatment  Patient Details  Name: Shannon [name] MRN: [id number] Date of Birth: June 10, 1966 Referring Provider: Cline Crock, PA-C  Encounter Date: 07/16/2017      OT End of Session - 07/16/17 1722    Visit Number 5   Number of Visits 8   Date for OT Re-Evaluation 07/24/17   Authorization Type UMR $60 copay per visit (pt requesting to be billed)   OT Start Time 1520   OT Stop Time 1600   OT Time Calculation (min) 40 min   Activity Tolerance Patient tolerated treatment well   Behavior During Therapy [facility] for tasks assessed/performed      Past Medical History:  Diagnosis Date  . Anemia   . Anxiety   . Carotid artery occlusion   . COPD (chronic obstructive pulmonary disease) ([place])   . Coronary artery disease    LHC 11/25/12 90% stenosis mid LCx & otherwise nonobstructive dz w/ EF 60-65% S/p PTCA/DES to LCx  . Depression   . Exertional dyspnea    chronic  . History of cardiovascular stress test 06/2015   low risk  . Hyperlipidemia   . Hypertension   . MI (myocardial infarction) ([place])   . Tobacco abuse     Past Surgical History:  Procedure Laterality Date  . CORONARY ANGIOPLASTY WITH STENT PLACEMENT    . LEFT HEART CATHETERIZATION WITH CORONARY ANGIOGRAM N/A 11/15/2012   Procedure: LEFT HEART CATHETERIZATION WITH CORONARY ANGIOGRAM;  Surgeon: Thayer Headings, MD;  Location: [facility] CATH LAB;  Service: Cardiovascular;  Laterality: N/A;  . PERCUTANEOUS CORONARY STENT INTERVENTION (PCI-S)  11/15/2012   Procedure: PERCUTANEOUS CORONARY STENT INTERVENTION (PCI-S);  Surgeon: Thayer Headings, MD;  Location: [facility] CATH LAB;  Service: Cardiovascular;;  . TONSILLECTOMY    . uterus ablation      There were no vitals filed for this visit.      Subjective Assessment - 07/16/17 1527    Subjective  S: I've been under a lot  of stress.    Currently in Pain? Yes   Pain Score 7    Pain Location Neck   Pain Orientation Right;Lateral;Posterior   Pain Descriptors / Indicators Constant;Tightness;Squeezing   Pain Type Chronic pain            [facility] OT Assessment - 07/16/17 1550      Assessment   Diagnosis Cervical Pain/Bilateral hand numbness     Precautions   Precautions Other (comment)   Precaution Comments 100% blocked carotid artery on left side of neck     Observation/Other Assessments   Observations Phalen's test completed which was inconclusive this date. Did not indicate a positive CTS.                  OT Treatments/Exercises (OP) - 07/16/17 1550      Exercises   Exercises Neck;Shoulder     Neck Exercises: Seated   X to V 10 reps     Neck Exercises: Supine   Cervical Rotation Both;10 reps   Lateral Flexion Both;10 reps     Shoulder Exercises: Supine   Flexion AROM;10 reps     Shoulder Exercises: ROM/Strengthening   UBE (Upper Arm Bike) Level 1 4' reverse   X to V Arms 10X     Manual Therapy   Manual Therapy Myofascial release   Manual therapy comments Manual therapy completed  prior to exercises.    Myofascial Release Myofascial release and manual stretching completed to bilateral cervical region to decrease fascial restrictions and increase joint mobility in a pain free zone.                   OT Short Term Goals - 06/29/17 1327      OT SHORT TERM GOAL #1   Title Patient will be educated and independent with HEP to faciliate progress in therapy and allow her to complete her ADL tasks with increased independence.   Time 4   Period Weeks   Status On-going     OT SHORT TERM GOAL #2   Title Patient will increase cervical ROM by 10 degrees where improvement is needed in order to look both ways and behind her when driving with less difficulty.    Time 4   Period Weeks   Status On-going     OT SHORT TERM GOAL #3   Title Patient will decrease pain in  cervical region to 3/10 or less to increase ability sleep with more comfort.   Time 4   Period Weeks   Status On-going     OT SHORT TERM GOAL #4   Title Patient will decrease fascial restrictions in cervical region to min amount or less to increase functional mobility needed to complete daily tasks.    Time 4   Period Weeks   Status On-going     OT SHORT TERM GOAL #5   Title Patient will be educated on safety awareness related to bilateral hand numbness in order to prevent serious injury when completing daily tasks.    Time 4   Period Weeks   Status On-going                  Plan - 07/16/17 1723    Clinical Impression Statement A: Patient presents with typical fascial restrictions on the right side and less on the left compared to last session. Supine and seated posture have greatly increased and patient reports that she is actively working on it at home.   Plan P: Continue with manual therapy to cervical and upper trapezius region. Complete scapular strengthening (seated hughston's)      Patient will benefit from skilled therapeutic intervention in order to improve the following deficits and impairments:     Visit Diagnosis: Cervicalgia  Other symptoms and signs involving the musculoskeletal system    Problem List Patient Active Problem List   Diagnosis Date Noted  . AAA (abdominal aortic aneurysm) without rupture ([place]) 01/05/2013  . Other malaise and fatigue 01/05/2013  . Palpitations 01/05/2013  . Tobacco use disorder 12/02/2012  . Coronary artery disease   . Hyperlipidemia   . Hypertension   . SHORTNESS OF BREATH 09/04/2009  . CHEST PAIN 09/04/2009   Shannon [name], OTR/L,CBIS  [phone]  07/16/2017, 5:25 PM  [place] [address] [place], [place], [address] Phone: [phone]   Fax:  [phone]  Name: Shannon [name] MRN: [id number] Date of Birth: 1965/12/04

## 2017-07-20 ENCOUNTER — Encounter (HOSPITAL_COMMUNITY): Payer: Self-pay | Admitting: Occupational Therapy

## 2017-07-20 ENCOUNTER — Ambulatory Visit (HOSPITAL_COMMUNITY): Payer: Commercial Managed Care - PPO | Admitting: Occupational Therapy

## 2017-07-20 DIAGNOSIS — M542 Cervicalgia: Secondary | ICD-10-CM

## 2017-07-20 DIAGNOSIS — R29898 Other symptoms and signs involving the musculoskeletal system: Secondary | ICD-10-CM

## 2017-07-20 NOTE — Therapy (Signed)
Shannon [name], [place], [address] Phone: [phone]   Fax:  [phone]  Occupational Therapy Treatment  Patient Details  Name: Shannon [name] MRN: [id number] Date of Birth: Sep 12, 1966 Referring Provider: Cline Crock, PA-C  Encounter Date: 07/20/2017      OT End of Session - 07/20/17 1652    Visit Number 6   Number of Visits 8   Date for OT Re-Evaluation 07/24/17   Authorization Type UMR $60 copay per visit (pt requesting to be billed)   OT Start Time 1520   OT Stop Time 1606   OT Time Calculation (min) 46 min   Activity Tolerance Patient tolerated treatment well   Behavior During Therapy [facility] for tasks assessed/performed      Past Medical History:  Diagnosis Date  . Anemia   . Anxiety   . Carotid artery occlusion   . COPD (chronic obstructive pulmonary disease) ([place])   . Coronary artery disease    LHC 11/25/12 90% stenosis mid LCx & otherwise nonobstructive dz w/ EF 60-65% S/p PTCA/DES to LCx  . Depression   . Exertional dyspnea    chronic  . History of cardiovascular stress test 06/2015   low risk  . Hyperlipidemia   . Hypertension   . MI (myocardial infarction) ([place])   . Tobacco abuse     Past Surgical History:  Procedure Laterality Date  . CORONARY ANGIOPLASTY WITH STENT PLACEMENT    . LEFT HEART CATHETERIZATION WITH CORONARY ANGIOGRAM N/A 11/15/2012   Procedure: LEFT HEART CATHETERIZATION WITH CORONARY ANGIOGRAM;  Surgeon: Thayer Headings, MD;  Location: [facility] CATH LAB;  Service: Cardiovascular;  Laterality: N/A;  . PERCUTANEOUS CORONARY STENT INTERVENTION (PCI-S)  11/15/2012   Procedure: PERCUTANEOUS CORONARY STENT INTERVENTION (PCI-S);  Surgeon: Thayer Headings, MD;  Location: [facility] CATH LAB;  Service: Cardiovascular;;  . TONSILLECTOMY    . uterus ablation      There were no vitals filed for this visit.      Subjective Assessment - 07/20/17 1521    Subjective  S: My blood pressure has  been sky high this weekend.    Currently in Pain? Yes   Pain Score 6    Pain Location Neck   Pain Orientation Right;Lateral;Posterior   Pain Descriptors / Indicators Aching;Constant;Tightness;Squeezing   Pain Type Chronic pain   Pain Radiating Towards n/a   Pain Onset More than a month ago   Pain Frequency Constant   Aggravating Factors  neck unsupported, driving for a long time, sleeping flat   Pain Relieving Factors heat   Effect of Pain on Daily Activities max effect on ADL completion   Multiple Pain Sites No            [facility] OT Assessment - 07/20/17 1521      Assessment   Diagnosis Cervical Pain/Bilateral hand numbness     Precautions   Precautions Other (comment)   Precaution Comments 100% blocked carotid artery on left side of neck                  OT Treatments/Exercises (OP) - 07/20/17 1523      Exercises   Exercises Neck;Shoulder     Neck Exercises: Seated   X to V  10 reps   Other Seated Exercise seated hughston exercises: 10X each, 5 positions     Neck Exercises: Supine   Cervical Rotation Both;10 reps   Lateral Flexion Both;10 reps     Shoulder  Exercises: ROM/Strengthening   UBE (Upper Arm Bike)  Level 1 4' reverse   X to V Arms  10X     Manual Therapy   Manual Therapy Myofascial release   Manual therapy comments Manual therapy completed prior to exercises.    Myofascial Release Myofascial release and manual stretching completed to bilateral cervical region to decrease fascial restrictions and increase joint mobility in a pain free zone.                   OT Short Term Goals - 06/29/17 1327      OT SHORT TERM GOAL #1   Title Patient will be educated and independent with HEP to faciliate progress in therapy and allow her to complete her ADL tasks with increased independence.   Time 4   Period Weeks   Status On-going     OT SHORT TERM GOAL #2   Title Patient will increase cervical ROM by 10 degrees where improvement is  needed in order to look both ways and behind her when driving with less difficulty.    Time 4   Period Weeks   Status On-going     OT SHORT TERM GOAL #3   Title Patient will decrease pain in cervical region to 3/10 or less to increase ability sleep with more comfort.   Time 4   Period Weeks   Status On-going     OT SHORT TERM GOAL #4   Title Patient will decrease fascial restrictions in cervical region to min amount or less to increase functional mobility needed to complete daily tasks.    Time 4   Period Weeks   Status On-going     OT SHORT TERM GOAL #5   Title Patient will be educated on safety awareness related to bilateral hand numbness in order to prevent serious injury when completing daily tasks.    Time 4   Period Weeks   Status On-going                  Plan - 07/20/17 1551    Clinical Impression Statement A: Pt reports increased stress over the weekend, fascial restrictions palpated on both right and left sides, left greater than right. Added seated hughston exercises this session, pt actively working on her posture during all exercises. Occasional verbal cuing for form.    Plan P: continue with manual therapy, reassessment.   Consulted and Agree with Plan of Care Patient      Patient will benefit from skilled therapeutic intervention in order to improve the following deficits and impairments:  Decreased strength, Pain, Decreased range of motion, Increased fascial restricitons, Impaired UE functional use  Visit Diagnosis: Cervicalgia  Other symptoms and signs involving the musculoskeletal system    Problem List Patient Active Problem List   Diagnosis Date Noted  . AAA (abdominal aortic aneurysm) without rupture ([place]) 01/05/2013  . Other malaise and fatigue 01/05/2013  . Palpitations 01/05/2013  . Tobacco use disorder 12/02/2012  . Coronary artery disease   . Hyperlipidemia   . Hypertension   . SHORTNESS OF BREATH 09/04/2009  . CHEST PAIN  09/04/2009   [place] [name], OTR/L  [phone] 07/20/2017, 4:53 PM  [facility] [address] [place], [place], [address] Phone: [phone]   Fax:  [phone]  Name: Shannon [name] MRN: [id number] Date of Birth: [birth year]

## 2017-07-22 NOTE — Progress Notes (Addendum)
New Carotid Patient  Requested by:  Starlyn Skeans, PA-C [place], [place] [address]  Reason for consultation: left carotid stenosis   History of Present Illness   Shannon [name] is a 51 y.o. (1966-05-26) female who presents with chief complaint: no complaints.  Pt initially was referred to Dr. Scot Dock for L ICA occlusion but on recent carotid studies demonstrated: RICA 86-76% stenosis, LICA 19-50% stenosis.  Dr. Scot Dock recommended: cardiac preop risk stratificaiton and optimization, CTA neck and CT head.  The patient reported recently had a stress test that was normal in the last year.  This patient presents today for second opinion.  Patient has no history of TIA or stroke symptom.  The patient has never had amaurosis fugax or monocular blindness.  The patient has never had facial drooping or hemiplegia.  The patient has never had receptive or expressive aphasia.   The patient has some paresthesia in both hands and lateral aspect of right thigh.  She feels this is likely due to her back problems.  She has known Lumbar DDD.  The patient's risks factors for carotid disease include: HLD, HTN, and active smoking.  Past Medical History:  Diagnosis Date  . Anemia   . Anxiety   . Carotid artery occlusion   . COPD (chronic obstructive pulmonary disease) ([place])   . Coronary artery disease    LHC 11/25/12 90% stenosis mid LCx & otherwise nonobstructive dz w/ EF 60-65% S/p PTCA/DES to LCx  . Depression   . Exertional dyspnea    chronic  . History of cardiovascular stress test 06/2015   low risk  . Hyperlipidemia   . Hypertension   . MI (myocardial infarction) ([place])   . Tobacco abuse     Past Surgical History:  Procedure Laterality Date  . CORONARY ANGIOPLASTY WITH STENT PLACEMENT    . LEFT HEART CATHETERIZATION WITH CORONARY ANGIOGRAM N/A 11/15/2012   Procedure: LEFT HEART CATHETERIZATION WITH CORONARY ANGIOGRAM;  Surgeon: Thayer Headings, MD;  Location: [facility] CATH LAB;   Service: Cardiovascular;  Laterality: N/A;  . PERCUTANEOUS CORONARY STENT INTERVENTION (PCI-S)  11/15/2012   Procedure: PERCUTANEOUS CORONARY STENT INTERVENTION (PCI-S);  Surgeon: Thayer Headings, MD;  Location[facility] CATH LAB;  Service: Cardiovascular;;  . TONSILLECTOMY    . uterus ablation      Social History   Social History  . Marital status: Legally Separated    Spouse name: N/A  . Number of children: N/A  . Years of education: N/A   Occupational History  . Not on file.   Social History Main Topics  . Smoking status: Current Every Day Smoker    Packs/day: 2.00    Years: 32.00    Types: Cigarettes    Start date: 03/29/1979  . Smokeless tobacco: Never Used     Comment: Currently 2ppd  . Alcohol use No  . Drug use: No  . Sexual activity: Not Currently    Partners: Male   Other Topics Concern  . Not on file   Social History Narrative   Lives with husband in a 3 story home.  Has no children.     On disability.     Education: high school, some college.    Family History  Problem Relation Age of Onset  . Depression Mother   . Anxiety disorder Maternal Grandmother   . Anxiety disorder Paternal Grandmother   . OCD Paternal Grandmother   . Depression Paternal Grandmother   . Heart attack Father  Deceased, 53  . Cerebral aneurysm Father   . Heart disease Father     Current Outpatient Prescriptions  Medication Sig Dispense Refill  . albuterol (PROVENTIL HFA;VENTOLIN HFA) 108 (90 Base) MCG/ACT inhaler Inhale 2 puffs into the lungs every 4 (four) hours as needed for wheezing or shortness of breath. 1 Inhaler 0  . ALPRAZolam (XANAX) 0.5 MG tablet Take 0.5 mg by mouth 2 (two) times daily as needed for anxiety.    Marland Kitchen aspirin 81 MG tablet Take 81 mg by mouth daily.    Marland Kitchen escitalopram (LEXAPRO) 20 MG tablet Take 1 tablet (20 mg total) by mouth daily. 30 tablet 0  . losartan (COZAAR) 25 MG tablet Take 1 tablet (25 mg total) by mouth daily. 30 tablet 6  . nitroGLYCERIN  (NITROSTAT) 0.4 MG SL tablet Place 1 tablet (0.4 mg total) under the tongue every 5 (five) minutes as needed for chest pain (up to 3 doses). 25 tablet 3  . propranolol (INDERAL) 10 MG tablet Take 10 mg by mouth 2 (two) times daily. Takes at night.    . rosuvastatin (CRESTOR) 10 MG tablet Take 1 tablet (10 mg total) by mouth at bedtime. 30 tablet 6  . vitamin B-12 (CYANOCOBALAMIN) 1000 MCG tablet Take 1,000 mcg by mouth daily.     No current facility-administered medications for this visit.     Allergies  Allergen Reactions  . Morphine And Related Other (See Comments)    PATIENT REFUSES THIS MEDICATION: states that she does not tolerate this medication well    REVIEW OF SYSTEMS (negative unless checked):   Cardiac:  []  Chest pain or chest pressure? []  Shortness of breath upon activity? []  Shortness of breath when lying flat? []  Irregular heart rhythm?  Vascular:  []  Pain in calf, thigh, or hip brought on by walking? []  Pain in feet at night that wakes you up from your sleep? []  Blood clot in your veins? []  Leg swelling?  Pulmonary:  []  Oxygen at home? []  Productive cough? []  Wheezing?  Neurologic:  []  Sudden weakness in arms or legs? []  Sudden numbness in arms or legs? []  Sudden onset of difficult speaking or slurred speech? []  Temporary loss of vision in one eye? []  Problems with dizziness?  Gastrointestinal:  []  Blood in stool? []  Vomited blood?  Genitourinary:  []  Burning when urinating? []  Blood in urine?  Psychiatric:  []  Major depression  Hematologic:  []  Bleeding problems? []  Problems with blood clotting?  Dermatologic:  []  Rashes or ulcers?  Constitutional:  []  Fever or chills?  Ear/Nose/Throat:  []  Change in hearing? []  Nose bleeds? []  Sore throat?  Musculoskeletal:  []  Back pain? []  Joint pain? []  Muscle pain?   For VQI Use Only   PRE-ADM LIVING Home  AMB STATUS Ambulatory  CAD Sx History of MI, but no symptoms No MI within 6 months   PRIOR CHF None  STRESS TEST Normal    Physical Examination     Vitals:   07/24/17 1129 07/24/17 1135  BP: 122/86 111/75  Pulse: 67 65  Resp: 16   Temp: (!) 97.4 F (36.3 C)   SpO2: 99%   Weight: 258 lb (117 kg)   Height: 5\' 6"  (1.676 m)    Body mass index is 41.64 kg/m.  General Alert, O x 3, WD, NAD  [place]/AT,    Ear/Nose/ Throat Hearing grossly intact, nares without erythema or drainage, oropharynx without Erythema or Exudate, Mallampati score: 3,   Eyes PERRLA, EOMI,  Neck Supple, mid-line trachea,    Pulmonary Sym exp, good B air movt, CTA B  Cardiac RRR, Nl S1, S2, no Murmurs, No rubs, No S3,S4  Vascular Vessel Right Left  Radial Palpable Palpable  Brachial Palpable Palpable  Carotid Palpable, No Bruit Palpable, No Bruit  Aorta Not palpable N/A  Femoral Palpable Palpable  Popliteal Not palpable Not palpable  PT Not palpable Not palpable  DP Faintly palpable Faintly palpable    Gastro- intestinal soft, non-distended, non-tender to palpation, No guarding or rebound, no HSM, no masses, no CVAT B, No palpable prominent aortic pulse,    Musculo- skeletal M/S 5/5 throughout  , Extremities without ischemic changes  , Non-pitting edema present: 1+ B, No visible varicosities , No Lipodermatosclerosis present  Neurologic Cranial nerves 2-12 intact , Pain and light touch intact in extremities , Motor exam as listed above  Psychiatric Judgement intact, Mood & affect appropriate for pt's clinical situation  Dermatologic See M/S exam for extremity exam, skin tags throughout  Lymphatic  Palpable lymph nodes: None    Radiology     CTA Neck (05/22/17) 1. SUBTOTAL OCCLUSION of the Left ICA bulb with a very faint RADIOGRAPHIC STRING SIGN along a 12 mm segment of the vessel beginning about 5 mm from the origin. See series 607, image 170. 2. Subsequently, the Left ICA is diminutive but patent throughout the siphon to the terminus. There is mild superimposed  supraclinoid segment stenosis due to calcified plaque. 3. Patent but mildly diminutive appearing left anterior circulation, with no other intracranial stenosis. 4. Comparatively mild right ICA atherosclerosis with no significant stenosis. Mild vertebral artery origin atherosclerosis with no significant stenosis. 5.  Normal CT appearance of the brain. 6. No acute findings in the neck.  I reviewed the CTA Neck, and the L ICA appears to have a short segment occlusion.  More definitive evaluation will likely need L carotid angiogram.   Outside Studies/Documentation   4 pages of outside documents were reviewed including: B carotid duplex and prior clinic charts.   Medical Decision Making   Rehema A Prigmore is a 51 y.o. female who presents with: R asx ICA stenosis 40-59%, L asx ICA sub-total vs total occlusion   Based on the patient's vascular studies and examination, I have offered the patient: B carotid and cerebral angiogram. I discussed with the patient the nature of angiographic procedures, especially the limited patencies of any endovascular intervention.   The patient is aware of that the risks of an angiographic procedure include but are not limited to: bleeding, infection, access site complications, renal failure, embolization, rupture of vessel, dissection, arteriovenous fistula, possible need for emergent surgical intervention, possible need for surgical procedures to treat the patient's pathology, anaphylactic reaction to contrast, and stroke and death.   The patient is aware of the risks and agrees to proceed.  She is scheduled for 1 NOV 18. I discussed in depth with the patient the nature of atherosclerosis, and emphasized the importance of maximal medical management including strict control of blood pressure, blood glucose, and lipid levels, obtaining regular exercise, antiplatelet agents, and cessation of smoking.   The patient is currently on a statin: Crestor.  The patient is  currently on an anti-platelet: ASA.  The patient is aware that without maximal medical management the underlying atherosclerotic disease process will progress, limiting the benefit of any interventions.  Thank you for allowing [place] to participate in this patient's care.   Adele Barthel, MD, FACS [facility] [place] Office: [phone] Pager: [phone]  07/22/2017, 10:19 AM

## 2017-07-23 ENCOUNTER — Ambulatory Visit (HOSPITAL_COMMUNITY): Payer: Commercial Managed Care - PPO | Admitting: Occupational Therapy

## 2017-07-24 ENCOUNTER — Ambulatory Visit (INDEPENDENT_AMBULATORY_CARE_PROVIDER_SITE_OTHER): Payer: Commercial Managed Care - PPO | Admitting: Vascular Surgery

## 2017-07-24 ENCOUNTER — Encounter: Payer: Self-pay | Admitting: *Deleted

## 2017-07-24 ENCOUNTER — Other Ambulatory Visit: Payer: Self-pay | Admitting: *Deleted

## 2017-07-24 ENCOUNTER — Encounter: Payer: Self-pay | Admitting: Vascular Surgery

## 2017-07-24 DIAGNOSIS — I6523 Occlusion and stenosis of bilateral carotid arteries: Secondary | ICD-10-CM

## 2017-07-24 DIAGNOSIS — I779 Disorder of arteries and arterioles, unspecified: Secondary | ICD-10-CM | POA: Insufficient documentation

## 2017-07-24 DIAGNOSIS — I739 Peripheral vascular disease, unspecified: Secondary | ICD-10-CM

## 2017-07-27 ENCOUNTER — Telehealth: Payer: Self-pay | Admitting: *Deleted

## 2017-07-27 ENCOUNTER — Ambulatory Visit (HOSPITAL_COMMUNITY): Payer: Commercial Managed Care - PPO | Admitting: Specialist

## 2017-07-27 NOTE — Telephone Encounter (Signed)
Patient left message that she needed to cancel her angiogram "I have something else sched. for that day". I called patient back to instruct her that she may call me back when she wants to reschedule this procedure.

## 2017-07-28 ENCOUNTER — Ambulatory Visit (HOSPITAL_COMMUNITY): Payer: Commercial Managed Care - PPO | Admitting: Specialist

## 2017-07-28 ENCOUNTER — Encounter (HOSPITAL_COMMUNITY): Payer: Self-pay | Admitting: Specialist

## 2017-07-28 DIAGNOSIS — M542 Cervicalgia: Secondary | ICD-10-CM | POA: Diagnosis not present

## 2017-07-28 DIAGNOSIS — R29898 Other symptoms and signs involving the musculoskeletal system: Secondary | ICD-10-CM

## 2017-07-28 NOTE — Therapy (Signed)
[place], [place], [address] Phone: [phone]   Fax:  [phone]  Occupational Therapy Treatment  Patient Details  Name: Shannon [name] MRN: [id number] Date of Birth: 01/21/1966 Referring Provider: Cline Crock, PA-C  Encounter Date: 07/28/2017      OT End of Session - 07/28/17 1558    Visit Number 7   Number of Visits 8   Date for OT Re-Evaluation 08/25/17   Authorization Type UMR $60 copay per visit (pt requesting to be billed)   OT Start Time 1125   OT Stop Time 1210   OT Time Calculation (min) 45 min      Past Medical History:  Diagnosis Date  . Anemia   . Anxiety   . Carotid artery occlusion   . COPD (chronic obstructive pulmonary disease) ([place])   . Coronary artery disease    LHC 11/25/12 90% stenosis mid LCx & otherwise nonobstructive dz w/ EF 60-65% S/p PTCA/DES to LCx  . Depression   . Exertional dyspnea    chronic  . History of cardiovascular stress test 06/2015   low risk  . Hyperlipidemia   . Hypertension   . MI (myocardial infarction) ([place])   . Tobacco abuse     Past Surgical History:  Procedure Laterality Date  . CORONARY ANGIOPLASTY WITH STENT PLACEMENT    . LEFT HEART CATHETERIZATION WITH CORONARY ANGIOGRAM N/A 11/15/2012   Procedure: LEFT HEART CATHETERIZATION WITH CORONARY ANGIOGRAM;  Surgeon: Thayer Headings, MD;  Location: [facility] CATH LAB;  Service: Cardiovascular;  Laterality: N/A;  . PERCUTANEOUS CORONARY STENT INTERVENTION (PCI-S)  11/15/2012   Procedure: PERCUTANEOUS CORONARY STENT INTERVENTION (PCI-S);  Surgeon: Thayer Headings, MD;  Location: [facility] CATH LAB;  Service: Cardiovascular;;  . TONSILLECTOMY    . uterus ablation      There were no vitals filed for this visit.      Subjective Assessment - 07/28/17 1557    Subjective  S:  I think its better than it was, but not back to normal.    Currently in Pain? Yes   Pain Score 5    Pain Location Neck  hands   Pain Orientation Right;Posterior   Pain Descriptors / Indicators Aching   Pain Type Chronic pain            [facility] OT Assessment - 07/28/17 0001      Assessment   Diagnosis Cervical Pain/Bilateral hand numbness     Precautions   Precautions Other (comment)   Precaution Comments 100% blocked carotid artery on left side of neck                  OT Treatments/Exercises (OP) - 07/28/17 0001      Exercises   Exercises Neck;Shoulder;Hand     Neck Exercises: Stretches   Upper Trapezius Stretch --     Neck Exercises: Seated   Neck Retraction 5 reps;5 secs   X to V 10 reps   W Back 10 reps     Neck Exercises: Supine   Capital Flexion 10 reps   Cervical Rotation Both;10 reps   Lateral Flexion Both;10 reps     Shoulder Exercises: Standing   Extension Theraband;10 reps   Theraband Level (Shoulder Extension) Level 3 (Green)   Row [employer] reps   Theraband Level (Shoulder Row) Level 3 (Green)   Retraction Theraband;10 reps   Theraband Level (Shoulder Retraction) Level 3 (Green)     Shoulder Exercises: ROM/Strengthening  UBE (Upper Arm Bike) Level 2 4' reverse   Graduated Retraction with Theraband 3 times 3 sets   Sustained Retraction with Theraband 3 times 3 sets      Hand Exercises   Tendon Glides 5 times   Other Hand Exercises wrist extension stretch 3 times 10 seconds     Manual Therapy   Manual Therapy Myofascial release   Manual therapy comments Manual therapy completed prior to exercises.    Myofascial Release Myofascial release and manual stretching completed to bilateral cervical region to decrease fascial restrictions and increase joint mobility in a pain free zone. Added myofascial release to bilateral carpal tunnel regions for decreased restrictions and numbness.                  OT Education - 07/28/17 1558    Education provided Yes   Education Details tendon glides and wrist extension stretch   Person(s) Educated Patient   Methods  Explanation;Handout   Comprehension Verbalized understanding;Returned demonstration          OT Short Term Goals - 06/29/17 1327      OT SHORT TERM GOAL #1   Title Patient will be educated and independent with HEP to faciliate progress in therapy and allow her to complete her ADL tasks with increased independence.   Time 4   Period Weeks   Status On-going     OT SHORT TERM GOAL #2   Title Patient will increase cervical ROM by 10 degrees where improvement is needed in order to look both ways and behind her when driving with less difficulty.    Time 4   Period Weeks   Status On-going     OT SHORT TERM GOAL #3   Title Patient will decrease pain in cervical region to 3/10 or less to increase ability sleep with more comfort.   Time 4   Period Weeks   Status On-going     OT SHORT TERM GOAL #4   Title Patient will decrease fascial restrictions in cervical region to min amount or less to increase functional mobility needed to complete daily tasks.    Time 4   Period Weeks   Status On-going     OT SHORT TERM GOAL #5   Title Patient will be educated on safety awareness related to bilateral hand numbness in order to prevent serious injury when completing daily tasks.    Time 4   Period Weeks   Status On-going                  Plan - 07/28/17 1601    Clinical Impression Statement A: Patient is making progress towards her goals, however she continues to have increased pain and decreased use of her hands due to pain in her hands and cervical region.     OT Frequency 2x / week   OT Duration 4 weeks   OT Treatment/Interventions Self-care/ADL training;Therapeutic exercise;Patient/family education;Manual Therapy;Ultrasound;Therapeutic activities;Cryotherapy;Electrical Stimulation;Moist Heat;Passive range of motion;DME and/or AE instruction   Plan P:  Continue skilled OT intervetnion to improve mobility and functional use of bilateral hands while decreasing pain in her cervical  region and hands.  Follow up on HEP.       Patient will benefit from skilled therapeutic intervention in order to improve the following deficits and impairments:  Decreased strength, Pain, Decreased range of motion, Increased fascial restricitons, Impaired UE functional use  Visit Diagnosis: Cervicalgia - Plan: Ot plan of care cert/re-cert  Other symptoms and signs involving  the musculoskeletal system - Plan: Ot plan of care cert/re-cert    Problem List Patient Active Problem List   Diagnosis Date Noted  . Carotid artery disease ([place]) 07/24/2017  . AAA (abdominal aortic aneurysm) without rupture ([place]) 01/05/2013  . Other malaise and fatigue 01/05/2013  . Palpitations 01/05/2013  . Tobacco use disorder 12/02/2012  . Coronary artery disease   . Hyperlipidemia   . Hypertension   . SHORTNESS OF BREATH 09/04/2009  . CHEST PAIN 09/04/2009    Vangie Bicker, [place], OTR/L [phone]  07/28/2017, 4:25 PM  [place], [place], [address] Phone: [phone]   Fax:  [phone]  Name: Shannon [name] MRN: [id number] Date of Birth: 03/05/1966

## 2017-07-28 NOTE — Patient Instructions (Signed)
  CARPAL TUNNEL (Nerve Compression Syndrome): Wrist Stretch    Extend right arm with fingers facing down. With left hand, gently pull fingers of right hand toward body. Hold position for ___ breaths. Repeat with arms switched. Repeat ___ times, alternating arms. Do __2_ times per day.  Copyright  VHI. All rights reserved.

## 2017-07-30 ENCOUNTER — Ambulatory Visit (HOSPITAL_COMMUNITY): Payer: Commercial Managed Care - PPO | Admitting: Specialist

## 2017-07-30 ENCOUNTER — Telehealth (HOSPITAL_COMMUNITY): Payer: Self-pay | Admitting: Specialist

## 2017-07-30 NOTE — Telephone Encounter (Signed)
Pt  is sick and can not come in today

## 2017-08-03 NOTE — Telephone Encounter (Signed)
Error no documentation

## 2017-08-04 ENCOUNTER — Ambulatory Visit (HOSPITAL_COMMUNITY): Payer: Commercial Managed Care - PPO | Attending: Physician Assistant

## 2017-08-04 DIAGNOSIS — R29898 Other symptoms and signs involving the musculoskeletal system: Secondary | ICD-10-CM | POA: Insufficient documentation

## 2017-08-04 DIAGNOSIS — M542 Cervicalgia: Secondary | ICD-10-CM | POA: Diagnosis present

## 2017-08-04 NOTE — Patient Instructions (Signed)
Complete each exercises at least 10 times. Complete 2-3 time a day.    AROM: Lateral Neck Flexion   Slowly tilt head toward one shoulder, then the other. Hold each position ____ seconds. Repeat ____ times per set. Do ____ sets per session. Do ____ sessions per day.  http://orth.exer.us/296   Copyright  VHI. All rights reserved.  AROM: Neck Extension   Bend head backward. Hold ____ seconds. Repeat ____ times per set. Do ____ sets per session. Do ____ sessions per day.  http://orth.exer.us/300   Copyright  VHI. All rights reserved.  AROM: Neck Flexion   Bend head forward. Hold ____ seconds. Repeat ____ times per set. Do ____ sets per session. Do ____ sessions per day.  http://orth.exer.us/298   Copyright  VHI. All rights reserved.  AROM: Neck Rotation   Turn head slowly to look over one shoulder, then the other. Hold each position ____ seconds. Repeat ____ times per set. Do ____ sets per session. Do ____ sessions per day.  http://orth.exer.us/294   Copyright  VHI. All rights reserved.

## 2017-08-04 NOTE — Therapy (Signed)
[place], [place], [address] Phone: [phone]   Fax:  [phone]  Occupational Therapy Treatment  Patient Details  Name: Shannon [name] MRN: [id number] Date of Birth: 1966-02-02 Referring Provider: Cline Crock, PA-C   Encounter Date: 08/04/2017  OT End of Session - 08/04/17 1610    Visit Number  8    Number of Visits  16    Date for OT Re-Evaluation  08/25/17    Authorization Type  UMR $60 copay per visit (pt requesting to be billed)    OT Start Time  1310 reassessment   reassessment   OT Stop Time  1358    OT Time Calculation (min)  48 min       Past Medical History:  Diagnosis Date  . Anemia   . Anxiety   . Carotid artery occlusion   . COPD (chronic obstructive pulmonary disease) ([place])   . Coronary artery disease    LHC 11/25/12 90% stenosis mid LCx & otherwise nonobstructive dz w/ EF 60-65% S/p PTCA/DES to LCx  . Depression   . Exertional dyspnea    chronic  . History of cardiovascular stress test 06/2015   low risk  . Hyperlipidemia   . Hypertension   . MI (myocardial infarction) ([place])   . Tobacco abuse     Past Surgical History:  Procedure Laterality Date  . CORONARY ANGIOPLASTY WITH STENT PLACEMENT    . TONSILLECTOMY    . uterus ablation      There were no vitals filed for this visit.  Subjective Assessment - 08/04/17 1505    Subjective   S: My hands are more numb    Currently in Pain?  Yes    Pain Score  7     Pain Location  Neck    Pain Orientation  -- entire cervical region   entire cervical region   Pain Descriptors / Indicators  Aching    Pain Type  Chronic pain    Pain Radiating Towards  N/A    Pain Onset  More than a month ago    Pain Frequency  Constant    Aggravating Factors   neck unsupported, driving for a long time, sleeping flat    Pain Relieving Factors  heat    Effect of Pain on Daily Activities  max effect on ADL completion         [facility] OT  Assessment - 08/04/17 1339      Assessment   Diagnosis  Cervical Pain/Bilateral hand numbness    Onset Date  -- June/July 2017   June/July 2017     Precautions   Precautions  Other (comment)    Precaution Comments  100% blocked carotid artery on left side of neck      Prior Function   Level of Independence  Independent      AROM   Overall AROM Comments  Assessed seated. Shoulder A/ROM WFL.    Cervical Flexion  52 previous: 40   previous: 40   Cervical Extension  25 previous: 22   previous: 22   Cervical - Right Side Bend  20 previuos: 29   previuos: 29   Cervical - Left Side Bend  40 previous: 36   previous: 36   Cervical - Right Rotation  53 previous: 45   previous: 45   Cervical - Left Rotation  60 previous: 63   previous: 63     Strength   Overall Strength  Comments  Assessed seated. IR/er adducted.    Strength Assessment Site  Shoulder    Right/Left Shoulder  Right;Left    Right Shoulder Flexion  5/5 previous: 4+/5   previous: 4+/5   Right Shoulder ABduction  5/5 previous: 4/5   previous: 4/5   Right Shoulder Internal Rotation  5/5 previous; 4+/5   previous; 4+/5   Right Shoulder External Rotation  5/5 previous: 4+/5   previous: 4+/5   Left Shoulder Flexion  5/5 previous: 4+/5   previous: 4+/5   Left Shoulder ABduction  5/5 previous: 4=/5   previous: 4=/5   Left Shoulder Internal Rotation  5/5 previous: same   previous: same   Left Shoulder External Rotation  5/5 previous: same   previous: same   Right Hand Grip (lbs)  40 previuos: 35   previuos: 35   Right Hand Lateral Pinch  14 lbs previous: 12   previous: 12   Right Hand 3 Point Pinch  13 lbs previous: 16   previous: 16   Left Hand Grip (lbs)  30 previous: 31   previous: 31   Left Hand Lateral Pinch  10 lbs previous: 9   previous: 9   Left Hand 3 Point Pinch  11 lbs previous: 10   previous: 10              OT Treatments/Exercises (OP) - 08/04/17 1530      Exercises   Exercises   Neck;Shoulder;Hand      Neck Exercises: Supine   Cervical Rotation  Both;10 reps    Lateral Flexion  Both;10 reps      Manual Therapy   Manual Therapy  Myofascial release    Manual therapy comments  Manual therapy completed prior to exercises.     Myofascial Release  Myofascial release and manual stretching completed to bilateral cervical region to decrease fascial restrictions and increase joint mobility in a pain free zone. Added myofascial release to bilateral carpal tunnel regions for decreased restrictions and numbness.               OT Education - 08/04/17 1610    Education provided  Yes    Education Details  cervical ROM    Person(s) Educated  Patient    Methods  Explanation;Demonstration;Handout;Verbal cues    Comprehension  Verbalized understanding       OT Short Term Goals - 08/04/17 1352      OT SHORT TERM GOAL #1   Title  Patient will be educated and independent with HEP to faciliate progress in therapy and allow her to complete her ADL tasks with increased independence.    Time  4    Period  Weeks    Status  On-going      OT SHORT TERM GOAL #2   Title  Patient will increase cervical ROM by 10 degrees where improvement is needed in order to look both ways and behind her when driving with less difficulty.     Time  4    Period  Weeks    Status  On-going      OT SHORT TERM GOAL #3   Title  Patient will decrease pain in cervical region to 3/10 or less to increase ability sleep with more comfort.    Time  4    Period  Weeks    Status  On-going      OT SHORT TERM GOAL #4   Title  Patient will decrease fascial restrictions in cervical region to  min amount or less to increase functional mobility needed to complete daily tasks.     Time  4    Period  Weeks    Status  On-going      OT SHORT TERM GOAL #5   Title  Patient will be educated on safety awareness related to bilateral hand numbness in order to prevent serious injury when completing daily tasks.      Time  4    Period  Weeks    Status  Achieved               Plan - 08/04/17 1612    Clinical Impression Statement  A: Pt has met one therapy goal this date. She has made improvements with her BUE shoulder strength. Pain level is still increased. Cervical ROM has increased slightly. She is making progress towards therapy and would benefit from continuing to focus on ROM and decreasing pain.     Plan  P: Continue skilled OT interventions to improve mobility and comfort level in cervical region. Continue with scapular strengthening and proximal shoulder strengthening.        Patient will benefit from skilled therapeutic intervention in order to improve the following deficits and impairments:  Decreased strength, Pain, Decreased range of motion, Increased fascial restricitons, Impaired UE functional use  Visit Diagnosis: Other symptoms and signs involving the musculoskeletal system  Cervicalgia    Problem List Patient Active Problem List   Diagnosis Date Noted  . Carotid artery disease ([place]) 07/24/2017  . AAA (abdominal aortic aneurysm) without rupture ([place]) 01/05/2013  . Other malaise and fatigue 01/05/2013  . Palpitations 01/05/2013  . Tobacco use disorder 12/02/2012  . Coronary artery disease   . Hyperlipidemia   . Hypertension   . SHORTNESS OF BREATH 09/04/2009  . CHEST PAIN 09/04/2009   Ailene Ravel, OTR/L,CBIS  [phone]  08/04/2017, 4:30 PM  [place] [address] [place], [place], [address] Phone: [phone]   Fax:  [phone]  Name: CAPRIA CARTAYA MRN: [id number] Date of Birth: Aug 25, 1966

## 2017-08-05 ENCOUNTER — Encounter: Payer: Self-pay | Admitting: Family Medicine

## 2017-08-06 ENCOUNTER — Encounter (HOSPITAL_COMMUNITY): Payer: Self-pay

## 2017-08-06 ENCOUNTER — Ambulatory Visit (HOSPITAL_COMMUNITY): Admit: 2017-08-06 | Payer: Commercial Managed Care - PPO | Admitting: Vascular Surgery

## 2017-08-06 SURGERY — CAROTID ANGIOGRAPHY
Anesthesia: LOCAL

## 2017-08-10 ENCOUNTER — Encounter (HOSPITAL_COMMUNITY): Payer: Commercial Managed Care - PPO

## 2017-08-12 ENCOUNTER — Ambulatory Visit (HOSPITAL_COMMUNITY): Payer: Commercial Managed Care - PPO

## 2017-08-12 ENCOUNTER — Telehealth (HOSPITAL_COMMUNITY): Payer: Self-pay

## 2017-08-12 NOTE — Telephone Encounter (Signed)
She is sick and can not come today

## 2017-08-14 ENCOUNTER — Telehealth (HOSPITAL_COMMUNITY): Payer: Self-pay

## 2017-08-14 ENCOUNTER — Ambulatory Visit (HOSPITAL_COMMUNITY): Payer: Commercial Managed Care - PPO

## 2017-08-14 NOTE — Telephone Encounter (Signed)
Called patient regarding no show. Left message reminding patient of next appointment and to call if she was unable to make it.   Ailene Ravel, OTR/L,CBIS  [phone]

## 2017-08-17 ENCOUNTER — Encounter (HOSPITAL_COMMUNITY): Payer: Commercial Managed Care - PPO | Admitting: Specialist

## 2017-08-18 ENCOUNTER — Encounter (HOSPITAL_COMMUNITY): Payer: Self-pay

## 2017-08-18 ENCOUNTER — Ambulatory Visit (HOSPITAL_COMMUNITY): Payer: Commercial Managed Care - PPO

## 2017-08-18 DIAGNOSIS — R29898 Other symptoms and signs involving the musculoskeletal system: Secondary | ICD-10-CM | POA: Diagnosis not present

## 2017-08-18 DIAGNOSIS — M542 Cervicalgia: Secondary | ICD-10-CM

## 2017-08-18 NOTE — Therapy (Signed)
[place] [name], [place], [address] Phone: [phone]   Fax:  [phone]  Occupational Therapy Treatment  Patient Details  Name: Shannon [name] MRN: [id number] Date of Birth: 13-Oct-1965 Referring Provider: Cline Crock, PA-C   Encounter Date: 08/18/2017  OT End of Session - 08/18/17 1501    Visit Number  9    Number of Visits  16    Date for OT Re-Evaluation  08/25/17    Authorization Type  UMR $60 copay per visit (pt requesting to be billed)    OT Start Time  0932 pt arrived late    OT Stop Time  1345    OT Time Calculation (min)  33 min    Activity Tolerance  Patient tolerated treatment well    Behavior During Therapy  [facility] for tasks assessed/performed       Past Medical History:  Diagnosis Date  . Anemia   . Anxiety   . Carotid artery occlusion   . COPD (chronic obstructive pulmonary disease) ([place])   . Coronary artery disease    LHC 11/25/12 90% stenosis mid LCx & otherwise nonobstructive dz w/ EF 60-65% S/p PTCA/DES to LCx  . Depression   . Exertional dyspnea    chronic  . History of cardiovascular stress test 06/2015   low risk  . Hyperlipidemia   . Hypertension   . MI (myocardial infarction) ([place])   . Tobacco abuse     Past Surgical History:  Procedure Laterality Date  . CORONARY ANGIOPLASTY WITH STENT PLACEMENT    . LEFT HEART CATHETERIZATION WITH CORONARY ANGIOGRAM N/A 11/15/2012   Procedure: LEFT HEART CATHETERIZATION WITH CORONARY ANGIOGRAM;  Surgeon: Thayer Headings, MD;  Location[facility] CATH LAB;  Service: Cardiovascular;  Laterality: N/A;  . PERCUTANEOUS CORONARY STENT INTERVENTION (PCI-S)  11/15/2012   Procedure: PERCUTANEOUS CORONARY STENT INTERVENTION (PCI-S);  Surgeon: Thayer Headings, MD;  Location: [facility] CATH LAB;  Service: Cardiovascular;;  . TONSILLECTOMY    . uterus ablation      There were no vitals filed for this visit.  Subjective Assessment - 08/18/17 1342    Subjective    S: I think a lot of this is stress related.    Currently in Pain?  Yes    Pain Score  6     Pain Location  Other (Comment) bilateral upper trapezius    Pain Orientation  Right;Left    Pain Descriptors / Indicators  Aching;Sore    Pain Type  Chronic pain    Pain Radiating Towards  N/A    Pain Onset  More than a month ago    Pain Frequency  Constant    Aggravating Factors   stress    Pain Relieving Factors  heat    Effect of Pain on Daily Activities  max effect    Multiple Pain Sites  No         [facility] OT Assessment - 08/18/17 1344      Assessment   Diagnosis  Cervical Pain/Bilateral hand numbness      Precautions   Precautions  Other (comment)    Precaution Comments  100% blocked carotid artery on left side of neck               OT Treatments/Exercises (OP) - 08/18/17 1344      Exercises   Exercises  Neck;Shoulder      Neck Exercises: Seated   Other Seated Exercise  seated rotation, lateral flexion  both sides, 10X      Shoulder Exercises: Supine   Horizontal ABduction  PROM;Both;5 reps    External Rotation  PROM;Both;5 reps    Internal Rotation  PROM;5 reps;Both    Flexion  PROM;Both;5 reps    ABduction  PROM;5 reps;Both      Manual Therapy   Manual Therapy  Myofascial release    Manual therapy comments  Manual therapy completed prior to exercises.     Myofascial Release  Myofascial release and manual stretching completed to bilateral cervical region to decrease fascial restrictions and increase joint mobility in a pain free zone. Added myofascial release to bilateral carpal tunnel regions for decreased restrictions and numbness.                 OT Short Term Goals - 08/18/17 1510      OT SHORT TERM GOAL #1   Title  Patient will be educated and independent with HEP to faciliate progress in therapy and allow her to complete her ADL tasks with increased independence.    Time  4    Period  Weeks    Status  On-going      OT SHORT TERM GOAL #2    Title  Patient will increase cervical ROM by 10 degrees where improvement is needed in order to look both ways and behind her when driving with less difficulty.     Time  4    Period  Weeks    Status  On-going      OT SHORT TERM GOAL #3   Title  Patient will decrease pain in cervical region to 3/10 or less to increase ability sleep with more comfort.    Time  4    Period  Weeks    Status  On-going      OT SHORT TERM GOAL #4   Title  Patient will decrease fascial restrictions in cervical region to min amount or less to increase functional mobility needed to complete daily tasks.     Time  4    Period  Weeks    Status  On-going      OT SHORT TERM GOAL #5   Title  Patient will be educated on safety awareness related to bilateral hand numbness in order to prevent serious injury when completing daily tasks.     Time  4    Period  Weeks               Plan - 08/18/17 1507    Clinical Impression Statement  A: Pt with equal tightness and tension in bilateral upper trapezius muscles. Session focused mainly on manual techniques to decrease pain and muscle tension. patient has good response to manual techniques.     Plan  P: Continue with improving cervical mobility and scapular strengthening.        Patient will benefit from skilled therapeutic intervention in order to improve the following deficits and impairments:  Decreased strength, Pain, Decreased range of motion, Increased fascial restricitons, Impaired UE functional use  Visit Diagnosis: Cervicalgia  Other symptoms and signs involving the musculoskeletal system    Problem List Patient Active Problem List   Diagnosis Date Noted  . Carotid artery disease ([place]) 07/24/2017  . AAA (abdominal aortic aneurysm) without rupture ([place]) 01/05/2013  . Other malaise and fatigue 01/05/2013  . Palpitations 01/05/2013  . Tobacco use disorder 12/02/2012  . Coronary artery disease   . Hyperlipidemia   . Hypertension   . SHORTNESS  OF  BREATH 09/04/2009  . CHEST PAIN 09/04/2009     Ailene Ravel, OTR/L,CBIS  [phone] 08/18/2017, 3:11 PM  [place] [address] [place], [place], [address] Phone: [phone]   Fax:  [phone]  Name: Shannon [name] MRN: [id number] Date of Birth: 1965/10/29

## 2017-08-24 ENCOUNTER — Encounter (HOSPITAL_COMMUNITY): Payer: Commercial Managed Care - PPO | Admitting: Specialist

## 2017-08-26 ENCOUNTER — Encounter (HOSPITAL_COMMUNITY): Payer: Self-pay

## 2017-08-26 ENCOUNTER — Ambulatory Visit (HOSPITAL_COMMUNITY): Payer: Commercial Managed Care - PPO

## 2017-08-26 DIAGNOSIS — R29898 Other symptoms and signs involving the musculoskeletal system: Secondary | ICD-10-CM | POA: Diagnosis not present

## 2017-08-26 DIAGNOSIS — M542 Cervicalgia: Secondary | ICD-10-CM

## 2017-08-26 NOTE — Therapy (Signed)
[place], [place], [address] Phone: [phone]   Fax:  [phone]  Occupational Therapy Treatment  Patient Details  Name: ETHAN KASPERSKI MRN: [id number] Date of Birth: 11/18/1965 Referring Provider: Cline Crock, PA-C   Encounter Date: 08/26/2017  OT End of Session - 08/26/17 1439    Visit Number  10    Number of Visits  16    Date for OT Re-Evaluation  08/25/17    Authorization Type  [facility] $60 copay per visit (pt requesting to be billed)    OT Start Time  1350    OT Stop Time  1430    OT Time Calculation (min)  40 min    Activity Tolerance  Patient tolerated treatment well    Behavior During Therapy  [facility] for tasks assessed/performed       Past Medical History:  Diagnosis Date  . Anemia   . Anxiety   . Carotid artery occlusion   . COPD (chronic obstructive pulmonary disease) ([place])   . Coronary artery disease    LHC 11/25/12 90% stenosis mid LCx & otherwise nonobstructive dz w/ EF 60-65% S/p PTCA/DES to LCx  . Depression   . Exertional dyspnea    chronic  . History of cardiovascular stress test 06/2015   low risk  . Hyperlipidemia   . Hypertension   . MI (myocardial infarction) ([place])   . Tobacco abuse     Past Surgical History:  Procedure Laterality Date  . CORONARY ANGIOPLASTY WITH STENT PLACEMENT    . LEFT HEART CATHETERIZATION WITH CORONARY ANGIOGRAM N/A 11/15/2012   Procedure: LEFT HEART CATHETERIZATION WITH CORONARY ANGIOGRAM;  Surgeon: Thayer Headings, MD;  Location: [facility] CATH LAB;  Service: Cardiovascular;  Laterality: N/A;  . PERCUTANEOUS CORONARY STENT INTERVENTION (PCI-S)  11/15/2012   Procedure: PERCUTANEOUS CORONARY STENT INTERVENTION (PCI-S);  Surgeon: Thayer Headings, MD;  Location: [facility] CATH LAB;  Service: Cardiovascular;;  . TONSILLECTOMY    . uterus ablation      There were no vitals filed for this visit.  Subjective Assessment - 08/26/17 1427    Subjective   S: I haven't  been doing much of anything which may have helped.    Currently in Pain?  Yes    Pain Score  5     Pain Location  Other (Comment) bilateral upper trapezius    Pain Orientation  Right;Left    Pain Descriptors / Indicators  Aching;Sore    Pain Type  Chronic pain    Pain Radiating Towards  N/A    Pain Onset  More than a month ago    Pain Frequency  Constant    Aggravating Factors   stress    Pain Relieving Factors  heat         [facility] OT Assessment - 08/26/17 1418      Assessment   Diagnosis  Cervical Pain/Bilateral hand numbness      Precautions   Precautions  Other (comment)    Precaution Comments  100% blocked carotid artery on left side of neck               OT Treatments/Exercises (OP) - 08/26/17 0001      Neck Exercises: Seated   Money  10 reps    Other Seated Exercise  seated [place]; 10X; #3    Other Seated Exercise  Wall V arms lift off; 10X      Shoulder Exercises: Standing   Extension  Theraband;10 reps    Theraband Level (Shoulder Extension)  Level 3 (Green)    Row  Theraband;10 reps    Theraband Level (Shoulder Row)  Level 3 (Green)    Retraction  Theraband;10 reps    Theraband Level (Shoulder Retraction)  Level 3 (Green)      Shoulder Exercises: ROM/Strengthening   "W" Arms  10X      Manual Therapy   Manual Therapy  Myofascial release    Manual therapy comments  Manual therapy completed prior to exercises.     Myofascial Release  Myofascial release and manual stretching completed to bilateral cervical region to decrease fascial restrictions and increase joint mobility in a pain free zone. Added myofascial release to bilateral carpal tunnel regions for decreased restrictions and numbness.                 OT Short Term Goals - 08/18/17 1510      OT SHORT TERM GOAL #1   Title  Patient will be educated and independent with HEP to faciliate progress in therapy and allow her to complete her ADL tasks with increased independence.    Time  4     Period  Weeks    Status  On-going      OT SHORT TERM GOAL #2   Title  Patient will increase cervical ROM by 10 degrees where improvement is needed in order to look both ways and behind her when driving with less difficulty.     Time  4    Period  Weeks    Status  On-going      OT SHORT TERM GOAL #3   Title  Patient will decrease pain in cervical region to 3/10 or less to increase ability sleep with more comfort.    Time  4    Period  Weeks    Status  On-going      OT SHORT TERM GOAL #4   Title  Patient will decrease fascial restrictions in cervical region to min amount or less to increase functional mobility needed to complete daily tasks.     Time  4    Period  Weeks    Status  On-going      OT SHORT TERM GOAL #5   Title  Patient will be educated on safety awareness related to bilateral hand numbness in order to prevent serious injury when completing daily tasks.     Time  4    Period  Weeks               Plan - 08/26/17 1439    Clinical Impression Statement  A: Patient with less fascial restrictions in bilateral upper trapezius this session. She reports that she still has the same amount of stress although is not physically doing much around the house due to her depression.     Plan  P: continue with scapular strengthening. Attempt 1# weight seated or standing if able to tolerate.        Patient will benefit from skilled therapeutic intervention in order to improve the following deficits and impairments:  Decreased strength, Pain, Decreased range of motion, Increased fascial restricitons, Impaired UE functional use  Visit Diagnosis: Cervicalgia  Other symptoms and signs involving the musculoskeletal system    Problem List Patient Active Problem List   Diagnosis Date Noted  . Carotid artery disease ([place]) 07/24/2017  . AAA (abdominal aortic aneurysm) without rupture ([place]) 01/05/2013  . Other malaise and fatigue 01/05/2013  . Palpitations  01/05/2013  .  Tobacco use disorder 12/02/2012  . Coronary artery disease   . Hyperlipidemia   . Hypertension   . SHORTNESS OF BREATH 09/04/2009  . CHEST PAIN 09/04/2009   Ailene Ravel, OTR/L,CBIS  [phone]  08/26/2017, 2:41 PM  [facility] [address] [place], [place], [address] Phone: [phone]   Fax:  [phone]  Name: ESME DURKIN MRN: [id number] Date of Birth: July 10, 1966

## 2017-08-28 ENCOUNTER — Ambulatory Visit (HOSPITAL_COMMUNITY): Payer: Commercial Managed Care - PPO | Admitting: Occupational Therapy

## 2017-09-01 ENCOUNTER — Encounter (HOSPITAL_COMMUNITY): Payer: Commercial Managed Care - PPO

## 2017-09-03 ENCOUNTER — Encounter (HOSPITAL_COMMUNITY): Payer: Self-pay

## 2017-09-03 ENCOUNTER — Ambulatory Visit (HOSPITAL_COMMUNITY): Payer: Commercial Managed Care - PPO | Attending: Physician Assistant

## 2017-09-03 DIAGNOSIS — R29898 Other symptoms and signs involving the musculoskeletal system: Secondary | ICD-10-CM | POA: Diagnosis not present

## 2017-09-03 DIAGNOSIS — M542 Cervicalgia: Secondary | ICD-10-CM | POA: Diagnosis present

## 2017-09-03 NOTE — Patient Instructions (Signed)
CERVICAL TOWEL ROTATION STRETCH  Hold the ends of a small folded bath towel and wrap it around your head and neck as shown. Place the towel on your face so as to minimize placing pressure on your jaw. Pressure should be placed on the side of your face/cheek bone.   Use your bottom most arm to anchor the towel in place. Use your top most arm to pull the towel to cause a gentle rotational stretch in your neck. Hold, then return to starting position and repeat. Hold for 10-15 seconds. Completed 2 times. Repeat several times a day.

## 2017-09-03 NOTE — Therapy (Signed)
[facility] [address] [place], [place], [address] Phone: [phone]   Fax:  [phone]  Occupational Therapy Treatment  Patient Details  Name: Shannon [name] MRN: [id number] Date of Birth: January 15, 1966 Referring Provider: Cline Crock, PA-C   Encounter Date: 09/03/2017  OT End of Session - 09/03/17 1702    Visit Number  11    Number of Visits  16    Authorization Type  [employer] $60 copay per visit (pt requesting to be billed)    OT Start Time  1345 reassessment and discharge     OT Stop Time  1430    OT Time Calculation (min)  45 min    Activity Tolerance  Patient tolerated treatment well    Behavior During Therapy  [facility] for tasks assessed/performed       Past Medical History:  Diagnosis Date  . Anemia   . Anxiety   . Carotid artery occlusion   . COPD (chronic obstructive pulmonary disease) ([place])   . Coronary artery disease    LHC 11/25/12 90% stenosis mid LCx & otherwise nonobstructive dz w/ EF 60-65% S/p PTCA/DES to LCx  . Depression   . Exertional dyspnea    chronic  . History of cardiovascular stress test 06/2015   low risk  . Hyperlipidemia   . Hypertension   . MI (myocardial infarction) ([place])   . Tobacco abuse     Past Surgical History:  Procedure Laterality Date  . CORONARY ANGIOPLASTY WITH STENT PLACEMENT    . LEFT HEART CATHETERIZATION WITH CORONARY ANGIOGRAM N/A 11/15/2012   Procedure: LEFT HEART CATHETERIZATION WITH CORONARY ANGIOGRAM;  Surgeon: Thayer Headings, MD;  Location: [facility] CATH LAB;  Service: Cardiovascular;  Laterality: N/A;  . PERCUTANEOUS CORONARY STENT INTERVENTION (PCI-S)  11/15/2012   Procedure: PERCUTANEOUS CORONARY STENT INTERVENTION (PCI-S);  Surgeon: Thayer Headings, MD;  Location: [facility] CATH LAB;  Service: Cardiovascular;;  . TONSILLECTOMY    . uterus ablation      There were no vitals filed for this visit.  Subjective Assessment - 09/03/17 1658    Subjective   S: I'm not doing my  exercises as much as I should be.    Currently in Pain?  Yes    Pain Score  6     Pain Location  Other (Comment) bilateral upper trapezius    Pain Orientation  Right;Left    Pain Descriptors / Indicators  Aching;Sore    Pain Type  Chronic pain    Pain Radiating Towards  N/A    Pain Onset  More than a month ago    Pain Frequency  Constant    Aggravating Factors   stress    Pain Relieving Factors  heat    Effect of Pain on Daily Activities  max effect    Multiple Pain Sites  No         [facility] OT Assessment - 09/03/17 1400      Assessment   Diagnosis  Cervical Pain/Bilateral hand numbness    Onset Date  -- June/July 2017      Precautions   Precautions  Other (comment)    Precaution Comments  100% blocked carotid artery on left side of neck      Prior Function   Level of Independence  Independent      AROM   Overall AROM Comments  Assessed seated. Shoulder A/ROM WFL.    Cervical Flexion  WFL    Cervical Extension  30 previous:  25    Cervical - Right Side Bend  WFL    Cervical - Left Side Bend  WFL    Cervical - Right Rotation  50 previous: 53    Cervical - Left Rotation  75 previous: 60      Strength   Overall Strength Comments  BUE strength for shoulder is 5/5 in all ranges.     Right Hand Grip (lbs)  41 previous: 40    Left Hand Grip (lbs)  35 previous: 30               OT Treatments/Exercises (OP) - 09/03/17 1700      Exercises   Exercises  Neck;Shoulder      Neck Exercises: Stretches   Other Neck Stretches  Cervical rotation to the right with towel; 10 seconds; 2 times      Neck Exercises: Supine   Cervical Rotation  Both;10 reps      Manual Therapy   Manual Therapy  Myofascial release    Manual therapy comments  Manual therapy completed prior to exercises.     Myofascial Release  Myofascial release and manual stretching completed to bilateral cervical region to decrease fascial restrictions and increase joint mobility in a pain free zone.               OT Education - 09/03/17 1701    Education provided  Yes    Education Details  reviewed progress in therapy and updated HEP to focus on cervical rotation and extension. Reviewed goals. Recommended that patient follow up with MD regarding next step of therapy.    Person(s) Educated  Patient    Methods  Explanation;Handout;Demonstration    Comprehension  Verbalized understanding;Returned demonstration       OT Short Term Goals - 09/03/17 1426      OT SHORT TERM GOAL #1   Title  Patient will be educated and independent with HEP to faciliate progress in therapy and allow her to complete her ADL tasks with increased independence.    Time  4    Period  Weeks    Status  Achieved      OT SHORT TERM GOAL #2   Title  Patient will increase cervical ROM by 10 degrees where improvement is needed in order to look both ways and behind her when driving with less difficulty.     Time  4    Period  Weeks    Status  Partially Met      OT SHORT TERM GOAL #3   Title  Patient will decrease pain in cervical region to 3/10 or less to increase ability sleep with more comfort.    Time  4    Period  Weeks    Status  Not Met      OT SHORT TERM GOAL #4   Title  Patient will decrease fascial restrictions in cervical region to min amount or less to increase functional mobility needed to complete daily tasks.     Time  4    Period  Weeks    Status  Partially Met      OT SHORT TERM GOAL #5   Title  Patient will be educated on safety awareness related to bilateral hand numbness in order to prevent serious injury when completing daily tasks.     Time  4    Period  Weeks               Plan - 09/03/17 1714  Clinical Impression Statement  A: Reassessment completed this date. patient has met 2/5 goals overall. Progress has improved with ROM with all areas except right cervical rotation and extension. patient has great results during session with decreasing fascial restrictions although  they return by the next session. Patient reports that she is under a lot of stress which is probably contributing to tightness that is in her bilateral upper trapezius region. At this point, due to lack of progress therapist is recommended that patient discharge and follow up with MD. She continues to have numbness in bilateral hands which has not decreased with therapy. Pt is in agreement with discharge. HEP was updated.     Plan  P: D/C from therapy with HEP.    Consulted and Agree with Plan of Care  Patient       Patient will benefit from skilled therapeutic intervention in order to improve the following deficits and impairments:  Decreased strength, Pain, Decreased range of motion, Increased fascial restrictions, Impaired UE functional use  Visit Diagnosis: Other symptoms and signs involving the musculoskeletal system  Cervicalgia    Problem List Patient Active Problem List   Diagnosis Date Noted  . Carotid artery disease ([place]) 07/24/2017  . AAA (abdominal aortic aneurysm) without rupture ([place]) 01/05/2013  . Other malaise and fatigue 01/05/2013  . Palpitations 01/05/2013  . Tobacco use disorder 12/02/2012  . Coronary artery disease   . Hyperlipidemia   . Hypertension   . SHORTNESS OF BREATH 09/04/2009  . CHEST PAIN 09/04/2009   OCCUPATIONAL THERAPY DISCHARGE SUMMARY  Visits from Start of Care: 11  Current functional level related to goals / functional outcomes: See above   Remaining deficits: See above   Education / Equipment: See above Plan: Patient agrees to discharge.  Patient goals were partially met. Patient is being discharged due to lack of progress.  ?????         Ailene Ravel, OTR/L,CBIS  [phone]  09/03/2017, 5:19 PM  [facility] [address] [place], [place], [address] Phone: [phone]   Fax:  [phone]  Name: Shannon [name] MRN: [id number] Date of Birth: 1966/07/01

## 2017-09-08 ENCOUNTER — Encounter (HOSPITAL_COMMUNITY): Payer: Commercial Managed Care - PPO

## 2017-09-11 ENCOUNTER — Encounter (HOSPITAL_COMMUNITY): Payer: Commercial Managed Care - PPO

## 2017-09-15 ENCOUNTER — Encounter (HOSPITAL_COMMUNITY): Payer: Commercial Managed Care - PPO

## 2017-09-17 ENCOUNTER — Encounter (HOSPITAL_COMMUNITY): Payer: Commercial Managed Care - PPO | Admitting: Occupational Therapy

## 2017-09-21 ENCOUNTER — Emergency Department (HOSPITAL_COMMUNITY)
Admission: EM | Admit: 2017-09-21 | Discharge: 2017-09-22 | Disposition: A | Discharge status: Home / Self Care | Payer: Commercial Managed Care - PPO | Attending: Emergency Medicine | Admitting: Emergency Medicine

## 2017-09-21 DIAGNOSIS — J449 Chronic obstructive pulmonary disease, unspecified: Secondary | ICD-10-CM | POA: Diagnosis not present

## 2017-09-21 DIAGNOSIS — J189 Pneumonia, unspecified organism: Secondary | ICD-10-CM | POA: Insufficient documentation

## 2017-09-21 DIAGNOSIS — I10 Essential (primary) hypertension: Secondary | ICD-10-CM | POA: Insufficient documentation

## 2017-09-21 DIAGNOSIS — R05 Cough: Secondary | ICD-10-CM | POA: Insufficient documentation

## 2017-09-21 DIAGNOSIS — F17210 Nicotine dependence, cigarettes, uncomplicated: Secondary | ICD-10-CM | POA: Insufficient documentation

## 2017-09-21 DIAGNOSIS — R0602 Shortness of breath: Secondary | ICD-10-CM | POA: Insufficient documentation

## 2017-09-21 DIAGNOSIS — Z885 Allergy status to narcotic agent status: Secondary | ICD-10-CM | POA: Insufficient documentation

## 2017-09-21 DIAGNOSIS — Z7982 Long term (current) use of aspirin: Secondary | ICD-10-CM | POA: Diagnosis not present

## 2017-09-21 DIAGNOSIS — J3489 Other specified disorders of nose and nasal sinuses: Secondary | ICD-10-CM | POA: Insufficient documentation

## 2017-09-21 DIAGNOSIS — R0789 Other chest pain: Secondary | ICD-10-CM | POA: Diagnosis present

## 2017-09-21 DIAGNOSIS — I2510 Atherosclerotic heart disease of native coronary artery without angina pectoris: Secondary | ICD-10-CM | POA: Diagnosis not present

## 2017-09-21 DIAGNOSIS — R350 Frequency of micturition: Secondary | ICD-10-CM | POA: Diagnosis not present

## 2017-09-21 DIAGNOSIS — R6884 Jaw pain: Secondary | ICD-10-CM | POA: Insufficient documentation

## 2017-09-21 DIAGNOSIS — Z79899 Other long term (current) drug therapy: Secondary | ICD-10-CM | POA: Diagnosis not present

## 2017-09-22 ENCOUNTER — Emergency Department (HOSPITAL_COMMUNITY): Payer: Commercial Managed Care - PPO

## 2017-09-22 ENCOUNTER — Other Ambulatory Visit: Payer: Self-pay

## 2017-09-22 ENCOUNTER — Encounter (HOSPITAL_COMMUNITY): Payer: Self-pay

## 2017-09-22 LAB — I-STAT CHEM 8, ED
BUN: 10 mg/dL (ref 6–20)
CREATININE: 0.9 mg/dL (ref 0.44–1.00)
Calcium, Ion: 1.13 mmol/L — ABNORMAL LOW (ref 1.15–1.40)
Chloride: 99 mmol/L — ABNORMAL LOW (ref 101–111)
GLUCOSE: 125 mg/dL — AB (ref 65–99)
HEMATOCRIT: 45 % (ref 36.0–46.0)
Hemoglobin: 15.3 g/dL — ABNORMAL HIGH (ref 12.0–15.0)
POTASSIUM: 3.7 mmol/L (ref 3.5–5.1)
Sodium: 136 mmol/L (ref 135–145)
TCO2: 24 mmol/L (ref 22–32)

## 2017-09-22 LAB — TROPONIN I

## 2017-09-22 LAB — URINALYSIS, ROUTINE W REFLEX MICROSCOPIC
BACTERIA UA: NONE SEEN
BILIRUBIN URINE: NEGATIVE
Glucose, UA: NEGATIVE mg/dL
Hgb urine dipstick: NEGATIVE
KETONES UR: NEGATIVE mg/dL
LEUKOCYTES UA: NEGATIVE
Nitrite: NEGATIVE
Protein, ur: NEGATIVE mg/dL
SQUAMOUS EPITHELIAL / LPF: NONE SEEN
Specific Gravity, Urine: 1.003 — ABNORMAL LOW (ref 1.005–1.030)
WBC, UA: NONE SEEN WBC/hpf (ref 0–5)
pH: 6 (ref 5.0–8.0)

## 2017-09-22 IMAGING — DX DG CHEST 2V
2 series · 2 of 2 positions shown · non-contrast
Comparison: Chest radiograph performed [DATE]

CLINICAL DATA: Acute onset of productive cough and left-sided chest
pain. Sinus congestion.

EXAM:
CHEST  2 VIEW

[chest lat]
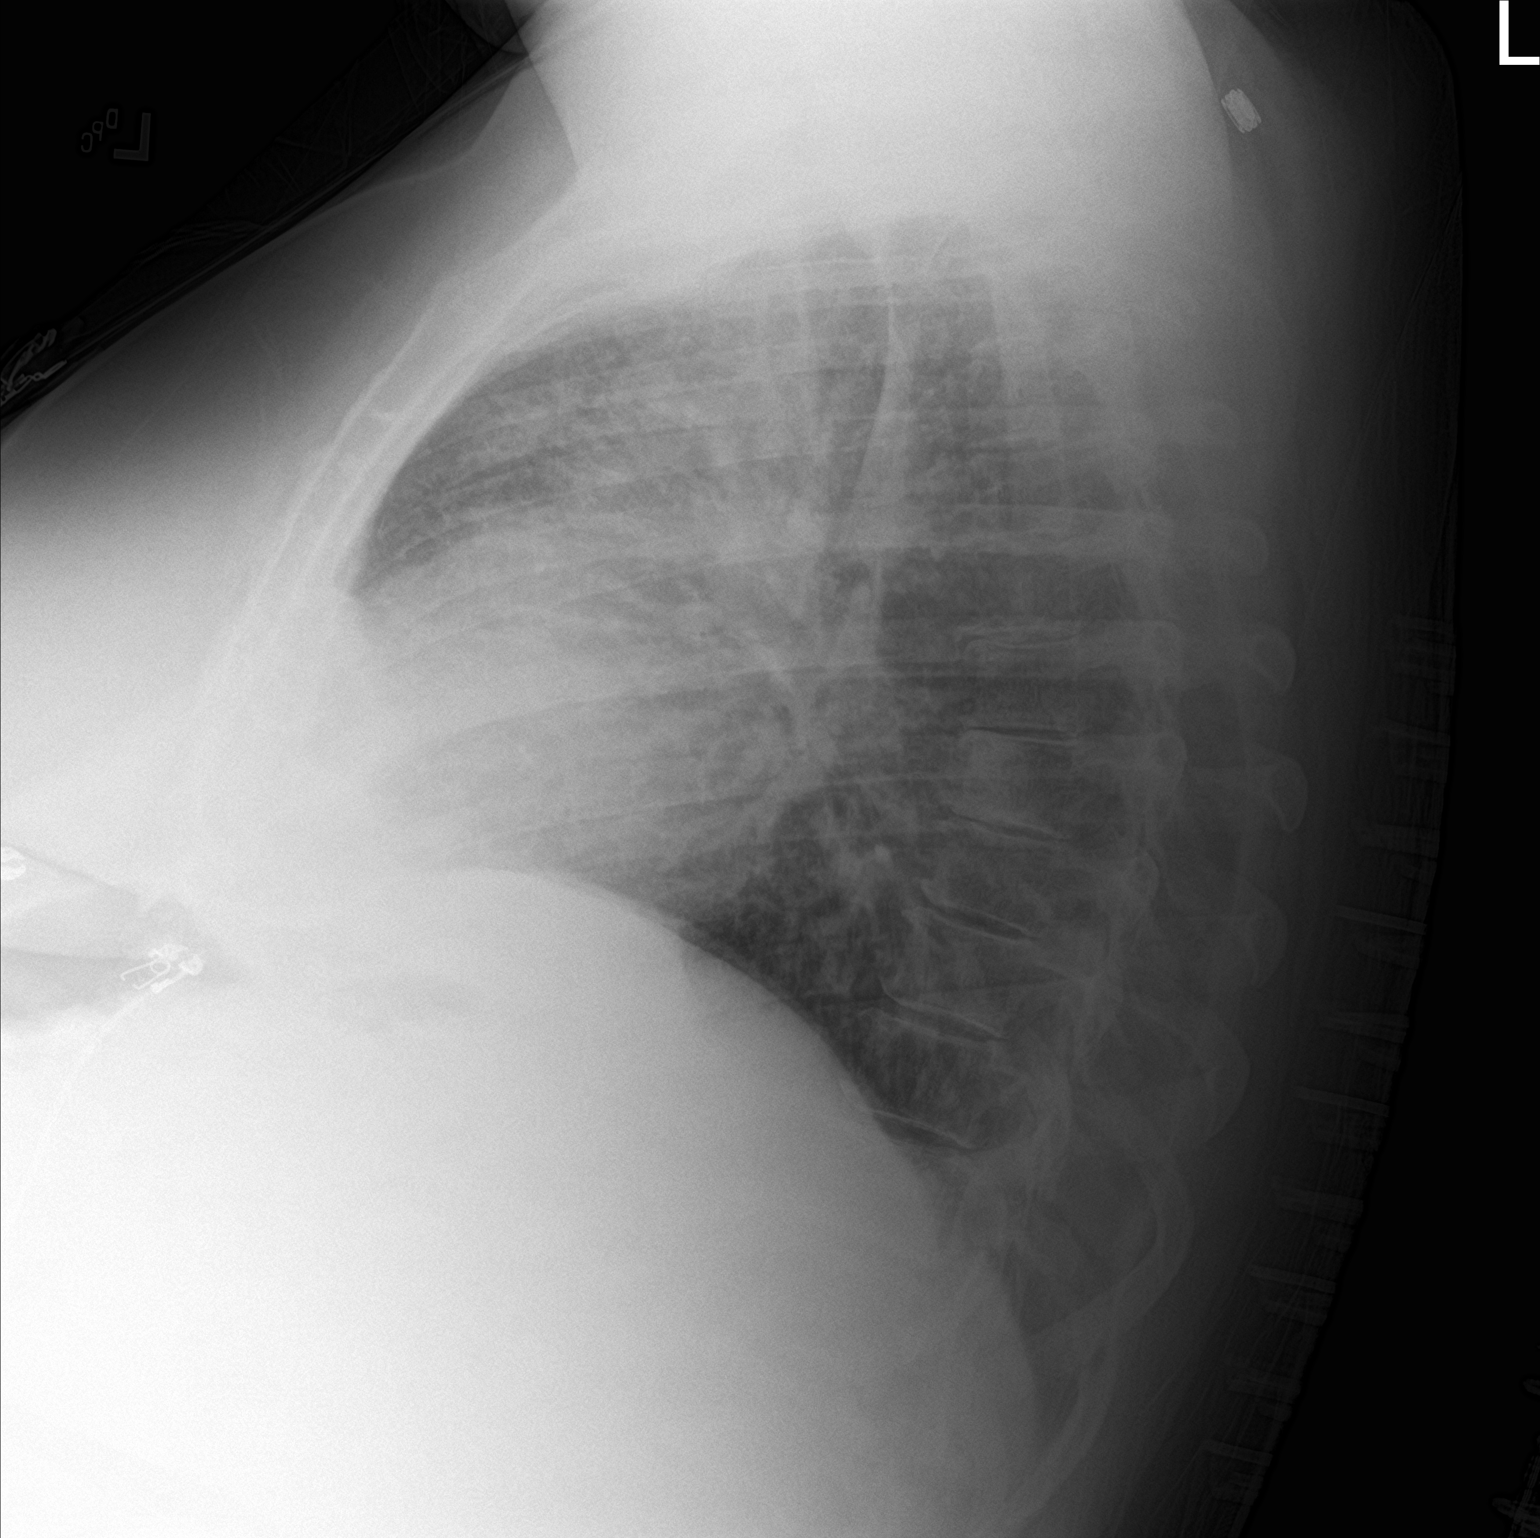

[chest ap]
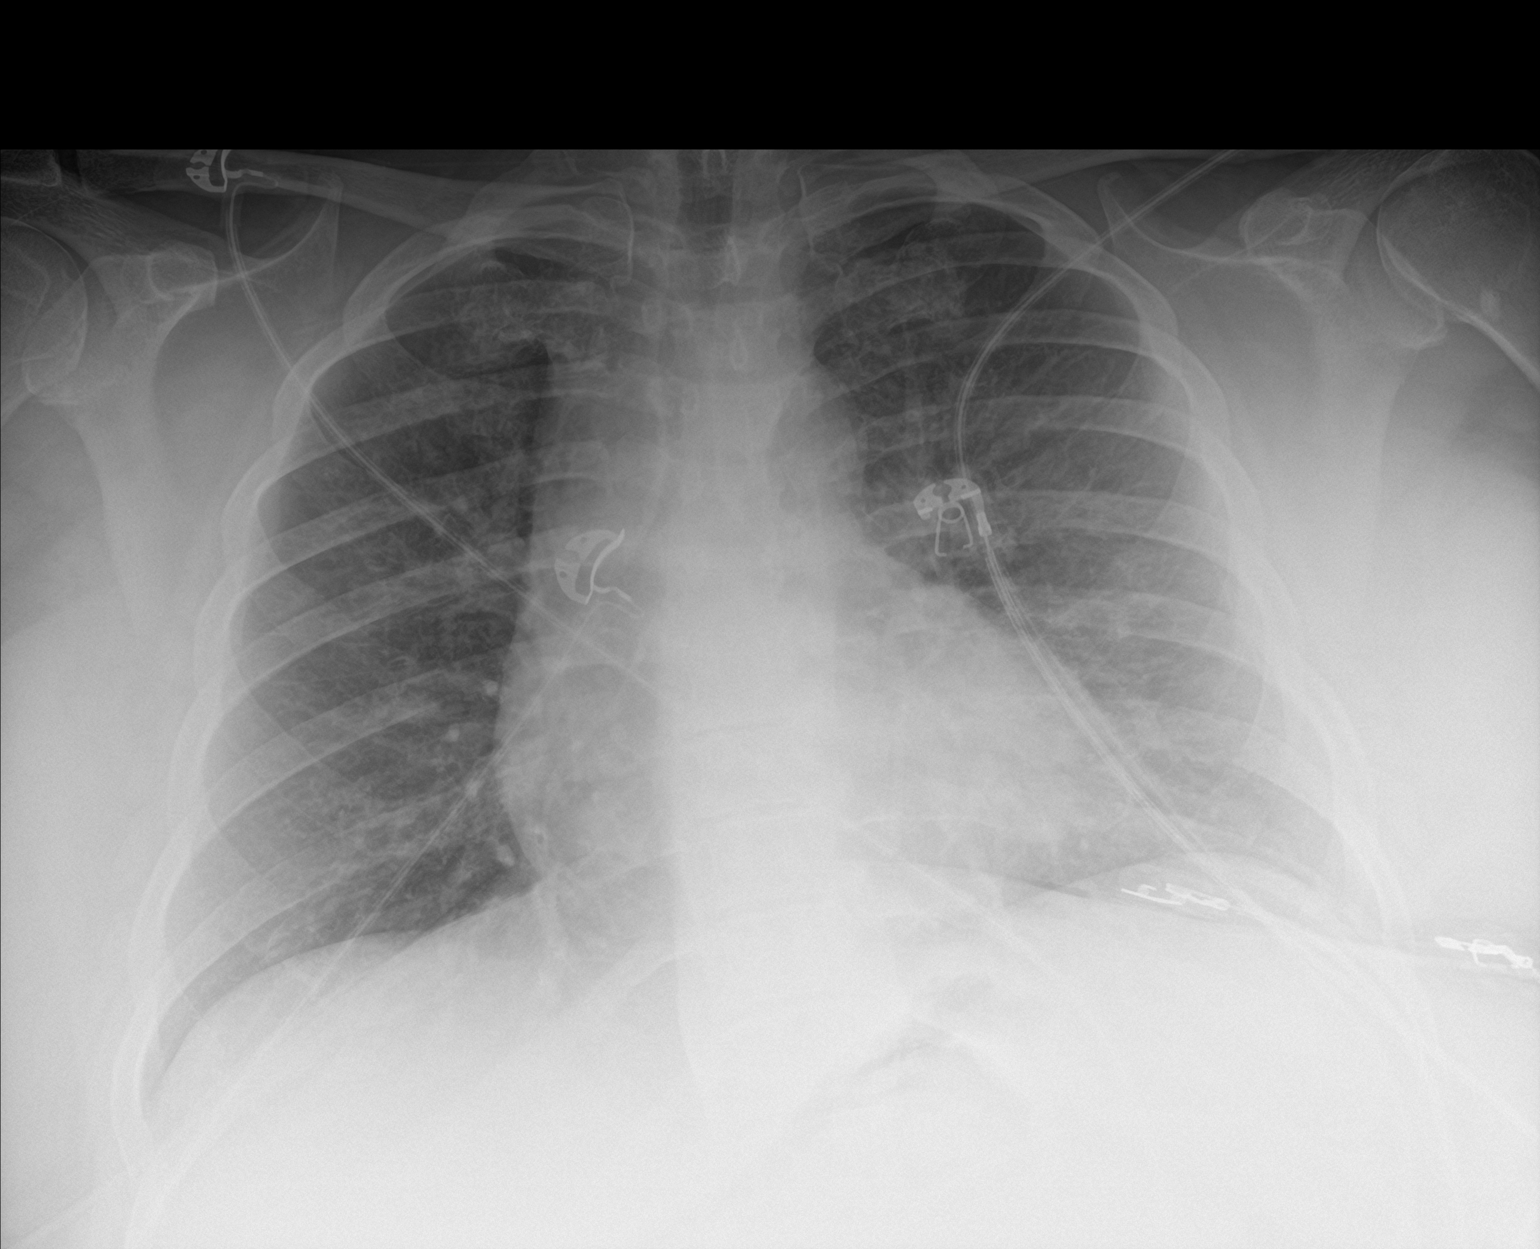

[2 of 2 positions shown; findings below may reference images not displayed]

FINDINGS: The lungs are well-aerated. Mild retrocardiac opacity raises concern
for pneumonia. There is no evidence of pleural effusion or
pneumothorax.

The heart is normal in size; the mediastinal contour is within
normal limits. No acute osseous abnormalities are seen.
IMPRESSION: Mild retrocardiac opacity raises concern for pneumonia.

## 2017-09-22 MED ORDER — AZITHROMYCIN 250 MG PO TABS
500.0000 mg | ORAL_TABLET | Freq: Once | ORAL | Status: AC
  Administered 2017-09-22: 500 mg via ORAL
  Filled 2017-09-22: qty 2

## 2017-09-22 MED ORDER — CEFTRIAXONE SODIUM 1 G IJ SOLR
1.0000 g | Freq: Once | INTRAMUSCULAR | Status: AC
  Administered 2017-09-22: 1 g via INTRAMUSCULAR
  Filled 2017-09-22: qty 10

## 2017-09-22 MED ORDER — AMOXICILLIN 500 MG PO CAPS: 500.0000 mg | ORAL_CAPSULE | Freq: Three times a day (TID) | ORAL | 0 refills | Status: DC

## 2017-09-22 MED ORDER — AZITHROMYCIN 250 MG PO TABS: ORAL_TABLET | ORAL | 0 refills | Status: DC

## 2017-09-22 MED ORDER — LIDOCAINE HCL (PF) 1 % IJ SOLN
INTRAMUSCULAR | Status: AC
  Filled 2017-09-22: qty 2

## 2017-09-22 NOTE — ED Notes (Signed)
Pt ambulated around the nurses station with a P Ox of 99 per cent through out- She ambulates at a steady pace and reports at the 3rd corner of the nurses station walk that she has a catch and points midsternally  Pt reports that she does not want to be discharged until she knows what is wrong with her

## 2017-09-22 NOTE — ED Triage Notes (Signed)
Pt reports 2 day history of sinus congestion, productive cough, sob, left chest pain that is worse with breathing at times.  Pt reports worsening of symptoms over 2 days.

## 2017-09-22 NOTE — Discharge Instructions (Signed)
Drink plenty of fluids.  Take the antibiotics as prescribed.  Take them until they are gone.  If you have a fever you can take ibuprofen 600 mg and/or acetaminophen 1000 mg every 6 hours.  Take Mucinex DM over-the-counter for cough.  You can use Nasacort nasal spray over-the-counter for your nasal stuffiness which will also help your ears to drain.  You should call your doctor's office on Wednesday, December 26 to let them know about your ED visit.  They may want to check you at the end of the week or next week.  Return to the emergency room if you struggle to breathe.

## 2017-09-22 NOTE — ED Notes (Signed)
To radiology

## 2017-09-22 NOTE — ED Notes (Signed)
From Rad 

## 2017-09-22 NOTE — ED Notes (Signed)
Pt reports smokes 2 PPD Carotid art occlusion Several day hx of jaw pain, midsternal CP  Reports allergy to morphine  But states dilaudid works

## 2017-09-22 NOTE — ED Provider Notes (Signed)
[facility] EMERGENCY DEPARTMENT Provider Note   CSN: [id number] Arrival date & time: 09/21/17  2354  Time seen 12:30 AM   History   Chief Complaint Chief Complaint  Patient presents with  . Chest Pain    HPI Shannon [name] is a 51 y.o. female.  HPI patient states 3-4 days ago she started having rhinorrhea or stuffy nose with minimal drainage that is clear, some sneezing, cough that is dry, fever up to around the 100, fullness in her ears, pain in her jaw especially on the right.  She also states she feels like a "tube is cut off" in her left chest and it makes her feel short of breath with exertion.  She describes the chest discomfort as dull and has been there constantly the past 3-4 days.  She denies wheezing.  She denies sore throat but states she has a mild discomfort in her throat.  She denies vomiting, nausea or diarrhea but states she has lower abdominal pain that comes and goes and last a few minutes.  She denies dysuria but states she is having frequency.  She states she had leftover Augmentin from a sinus infection recently that she did not finish and she has been taking them for 3 or 4 days.  PCP Glenford Bayley, DO [facility]   Past Medical History:  Diagnosis Date  . Anemia   . Anxiety   . Carotid artery occlusion   . COPD (chronic obstructive pulmonary disease) ([place])   . Coronary artery disease    LHC 11/25/12 90% stenosis mid LCx & otherwise nonobstructive dz w/ EF 60-65% S/p PTCA/DES to LCx  . Depression   . Exertional dyspnea    chronic  . History of cardiovascular stress test 06/2015   low risk  . Hyperlipidemia   . Hypertension   . MI (myocardial infarction) ([place])   . Tobacco abuse     Patient Active Problem List   Diagnosis Date Noted  . Carotid artery disease ([place]) 07/24/2017  . AAA (abdominal aortic aneurysm) without rupture ([place]) 01/05/2013  . Other malaise and fatigue 01/05/2013  . Palpitations 01/05/2013  . Tobacco use disorder 12/02/2012    . Coronary artery disease   . Hyperlipidemia   . Hypertension   . SHORTNESS OF BREATH 09/04/2009  . CHEST PAIN 09/04/2009    Past Surgical History:  Procedure Laterality Date  . CORONARY ANGIOPLASTY WITH STENT PLACEMENT    . LEFT HEART CATHETERIZATION WITH CORONARY ANGIOGRAM N/A 11/15/2012   Procedure: LEFT HEART CATHETERIZATION WITH CORONARY ANGIOGRAM;  Surgeon: Thayer Headings, MD;  Location: [facility] CATH LAB;  Service: Cardiovascular;  Laterality: N/A;  . PERCUTANEOUS CORONARY STENT INTERVENTION (PCI-S)  11/15/2012   Procedure: PERCUTANEOUS CORONARY STENT INTERVENTION (PCI-S);  Surgeon: Thayer Headings, MD;  Location: [facility] CATH LAB;  Service: Cardiovascular;;  . TONSILLECTOMY    . uterus ablation      OB History    No data available       Home Medications    Prior to Admission medications   Medication Sig Start Date End Date Taking? Authorizing Provider  albuterol (PROVENTIL HFA;VENTOLIN HFA) 108 (90 Base) MCG/ACT inhaler Inhale 2 puffs into the lungs every 4 (four) hours as needed for wheezing or shortness of breath. 12/21/15   Ward, Delice Bison, DO  ALPRAZolam Duanne Moron) 0.5 MG tablet Take 0.5 mg by mouth 2 (two) times daily as needed for anxiety.    [provider]  ALPRAZolam Duanne Moron) 1 MG  tablet Take 1 mg by mouth 2 (two) times daily as needed. 06/12/17   [provider]  amoxicillin (AMOXIL) 500 MG capsule Take 1 capsule (500 mg total) by mouth 3 (three) times daily. 09/22/17   Rolland Porter, MD  amoxicillin-clavulanate (AUGMENTIN) 875-125 MG tablet TAKE 1 TABLET BY MOUTH EVERY 12 HOURS FOR 10 DAYS 07/21/17   [provider]  aspirin 81 MG tablet Take 81 mg by mouth daily.    [provider]  azithromycin (ZITHROMAX) 250 MG tablet Take 1 po QD starting on Dec 26 09/22/17   Rolland Porter, MD  escitalopram (LEXAPRO) 10 MG tablet Take 10 mg by mouth daily.  07/15/17   [provider]  escitalopram (LEXAPRO) 20 MG tablet Take 1 tablet (20 mg total) by  mouth daily. 02/02/14 06/24/17  Leonides Grills, MD  losartan (COZAAR) 25 MG tablet Take 1 tablet (25 mg total) by mouth daily. 02/27/17 05/28/17  Herminio Commons, MD  nitroGLYCERIN (NITROSTAT) 0.4 MG SL tablet Place 1 tablet (0.4 mg total) under the tongue every 5 (five) minutes as needed for chest pain (up to 3 doses). 11/16/12   Hope, Jessica A, PA-C  propranolol (INDERAL) 10 MG tablet Take 10 mg by mouth 2 (two) times daily. Takes at night.    [provider]  rosuvastatin (CRESTOR) 10 MG tablet Take 1 tablet (10 mg total) by mouth at bedtime. 02/27/17   Herminio Commons, MD  vitamin B-12 (CYANOCOBALAMIN) 1000 MCG tablet Take 1,000 mcg by mouth daily.    [provider]    Family History Family History  Problem Relation Age of Onset  . Depression Mother   . Anxiety disorder Maternal Grandmother   . Anxiety disorder Paternal Grandmother   . OCD Paternal Grandmother   . Depression Paternal Grandmother   . Heart attack Father        Deceased, 29  . Cerebral aneurysm Father   . Heart disease Father     Social History Social History   Tobacco Use  . Smoking status: Current Every Day Smoker    Packs/day: 2.00    Years: 32.00    Pack years: 64.00    Types: Cigarettes    Start date: 03/29/1979  . Smokeless tobacco: Never Used  . Tobacco comment: Currently 2ppd  Substance Use Topics  . Alcohol use: No    Alcohol/week: 0.0 oz  . Drug use: No  Still smokes 2 packs a day States she is on disability for heart problems, depression, fibromyalgia and "multiple things".   Allergies   Morphine and related   Review of Systems Review of Systems  All other systems reviewed and are negative.    Physical Exam Updated Vital Signs BP 123/72 (BP Location: Left Arm)   Pulse 81   Temp 98.7 F (37.1 C) (Oral)   Resp 18   Ht 5\' 5"  (1.651 m)   Wt 117.9 kg (260 lb)   SpO2 97%   BMI 43.27 kg/m   Vital signs normal    Physical Exam  Constitutional: She is  oriented to person, place, and time. She appears well-developed and well-nourished.  Non-toxic appearance. She does not appear ill. No distress.  HENT:  Head: Normocephalic and atraumatic.  Right Ear: External ear normal.  Left Ear: External ear normal.  Nose: Nose normal. No mucosal edema or rhinorrhea.  Mouth/Throat: Oropharynx is clear and moist and mucous membranes are normal. No dental abscesses or uvula swelling.  Right TM is obscured  by cerumen, left TM is mildly dull without erythema or bulging.  This canal is normal.  She states the left is more stuffy than the right.  Eyes: Conjunctivae and EOM are normal. Pupils are equal, round, and reactive to light.  Neck: Normal range of motion and full passive range of motion without pain. Neck supple.  Cardiovascular: Normal rate, regular rhythm and normal heart sounds. Exam reveals no gallop and no friction rub.  No murmur heard. Pulmonary/Chest: Effort normal and breath sounds normal. No respiratory distress. She has no wheezes. She has no rhonchi. She has no rales. She exhibits no tenderness and no crepitus.  Area of chest pain noted    Abdominal: Soft. Normal appearance and bowel sounds are normal. She exhibits no distension. There is no tenderness. There is no rebound and no guarding.  Musculoskeletal: Normal range of motion. She exhibits no edema or tenderness.  Moves all extremities well.   Neurological: She is alert and oriented to person, place, and time. She has normal strength. No cranial nerve deficit.  Skin: Skin is warm, dry and intact. No rash noted. No erythema. No pallor.  Psychiatric: She has a normal mood and affect. Her speech is normal and behavior is normal. Her mood appears not anxious.  Nursing note and vitals reviewed.    ED Treatments / Results  Labs (all labs ordered are listed, but only abnormal results are displayed) Results for orders placed or performed during the hospital encounter of 09/21/17    Urinalysis, Routine w reflex microscopic  Result Value Ref Range   Color, Urine COLORLESS (A) YELLOW   APPearance CLEAR CLEAR   Specific Gravity, Urine 1.003 (L) 1.005 - 1.030   pH 6.0 5.0 - 8.0   Glucose, UA NEGATIVE NEGATIVE mg/dL   Hgb urine dipstick NEGATIVE NEGATIVE   Bilirubin Urine NEGATIVE NEGATIVE   Ketones, ur NEGATIVE NEGATIVE mg/dL   Protein, ur NEGATIVE NEGATIVE mg/dL   Nitrite NEGATIVE NEGATIVE   Leukocytes, UA NEGATIVE NEGATIVE   RBC / HPF 0-5 0 - 5 RBC/hpf   WBC, UA NONE SEEN 0 - 5 WBC/hpf   Bacteria, UA NONE SEEN NONE SEEN   Squamous Epithelial / LPF NONE SEEN NONE SEEN   Mucus PRESENT   Troponin I  Result Value Ref Range   Troponin I <0.03 <0.03 ng/mL  I-stat Chem 8, ED  Result Value Ref Range   Sodium 136 135 - 145 mmol/L   Potassium 3.7 3.5 - 5.1 mmol/L   Chloride 99 (L) 101 - 111 mmol/L   BUN 10 6 - 20 mg/dL   Creatinine, Ser 0.90 0.44 - 1.00 mg/dL   Glucose, Bld 125 (H) 65 - 99 mg/dL   Calcium, Ion 1.13 (L) 1.15 - 1.40 mmol/L   TCO2 24 22 - 32 mmol/L   Hemoglobin 15.3 (H) 12.0 - 15.0 g/dL   HCT 45.0 36.0 - 46.0 %   Laboratory interpretation all normal except hyperglycemia    EKG  EKG Interpretation  Date/Time:  Tuesday September 22 2017 00:04:10 EST Ventricular Rate:  80 PR Interval:    QRS Duration: 90 QT Interval:  365 QTC Calculation: 421 R Axis:   63 Text Interpretation:  Sinus rhythm Normal ECG No significant change since last tracing 23 Mar 2015 Confirmed by Rolland Porter [phone]) on 09/22/2017 12:09:43 AM       Radiology Dg Chest 2 View  Result Date: 09/22/2017 CLINICAL DATA:  Acute onset of productive cough and left-sided chest pain. Sinus congestion. EXAM:  CHEST  2 VIEW COMPARISON:  Chest radiograph performed 06/30/2016 FINDINGS: The lungs are well-aerated. Mild retrocardiac opacity raises concern for pneumonia. There is no evidence of pleural effusion or pneumothorax. The heart is normal in size; the mediastinal contour is within  normal limits. No acute osseous abnormalities are seen. IMPRESSION: Mild retrocardiac opacity raises concern for pneumonia. Electronically Signed   By: Garald Balding M.D.   On: 09/22/2017 00:58    Procedures Procedures (including critical care time)  Medications Ordered in ED Medications  lidocaine (PF) (XYLOCAINE) 1 % injection (not administered)  cefTRIAXone (ROCEPHIN) injection 1 g (1 g Intramuscular Given 09/22/17 0236)  azithromycin (ZITHROMAX) tablet 500 mg (500 mg Oral Given 09/22/17 0236)     Initial Impression / Assessment and Plan / ED Course  I have reviewed the triage vital signs and the nursing notes.  Pertinent labs & imaging results that were available during my care of the patient were reviewed by me and considered in my medical decision making (see chart for details).    Chest x-ray was done, urinalysis was done with her complaints of urinary frequency and lower abdominal pain.  Chest x-ray is suggestive of pneumonia.  Patient was ambulated by nursing staff and her pulse ox remained 99% on room air  Talked to patient about her test results.  She was given Rocephin IM and started on Zithromax.  I am going to change her antibiotics   Final Clinical Impressions(s) / ED Diagnoses   Final diagnoses:  Community acquired pneumonia, unspecified laterality    ED Discharge Orders        Ordered    amoxicillin (AMOXIL) 500 MG capsule  3 times daily     09/22/17 0213    azithromycin (ZITHROMAX) 250 MG tablet     09/22/17 0630    OTC nasacort  Plan discharge  Rolland Porter, MD, Barbette Or, MD 09/22/17 [phone]

## 2017-09-23 ENCOUNTER — Ambulatory Visit (HOSPITAL_COMMUNITY): Payer: Commercial Managed Care - PPO

## 2017-09-23 ENCOUNTER — Other Ambulatory Visit: Payer: Self-pay | Admitting: Family Medicine

## 2017-09-23 DIAGNOSIS — M79604 Pain in right leg: Secondary | ICD-10-CM

## 2017-10-07 ENCOUNTER — Ambulatory Visit (HOSPITAL_COMMUNITY)
Admission: RE | Admit: 2017-10-07 | Discharge: 2017-10-07 | Disposition: A | Discharge status: Home / Self Care | Payer: Commercial Managed Care - PPO | Source: Ambulatory Visit | Attending: Family Medicine | Admitting: Family Medicine

## 2017-10-07 ENCOUNTER — Ambulatory Visit (HOSPITAL_COMMUNITY)
Admission: RE | Admit: 2017-10-07 | Discharge: 2017-10-07 | Disposition: A | Discharge status: Home / Self Care | Payer: Medicare HMO | Source: Ambulatory Visit | Attending: Family Medicine | Admitting: Family Medicine

## 2017-10-07 ENCOUNTER — Encounter (HOSPITAL_COMMUNITY): Payer: Self-pay

## 2017-10-07 DIAGNOSIS — E785 Hyperlipidemia, unspecified: Secondary | ICD-10-CM | POA: Insufficient documentation

## 2017-10-07 DIAGNOSIS — M79604 Pain in right leg: Secondary | ICD-10-CM | POA: Insufficient documentation

## 2017-10-07 DIAGNOSIS — Z87891 Personal history of nicotine dependence: Secondary | ICD-10-CM | POA: Insufficient documentation

## 2017-10-07 DIAGNOSIS — I10 Essential (primary) hypertension: Secondary | ICD-10-CM | POA: Diagnosis not present

## 2017-10-27 ENCOUNTER — Other Ambulatory Visit: Payer: Self-pay

## 2017-10-27 DIAGNOSIS — I6523 Occlusion and stenosis of bilateral carotid arteries: Secondary | ICD-10-CM

## 2017-11-25 ENCOUNTER — Ambulatory Visit: Payer: Medicare HMO | Admitting: Vascular Surgery

## 2017-11-25 ENCOUNTER — Encounter (HOSPITAL_COMMUNITY): Payer: Self-pay

## 2017-11-25 DIAGNOSIS — Z6841 Body Mass Index (BMI) 40.0 and over, adult: Secondary | ICD-10-CM | POA: Diagnosis not present

## 2017-11-25 DIAGNOSIS — F332 Major depressive disorder, recurrent severe without psychotic features: Secondary | ICD-10-CM | POA: Diagnosis not present

## 2017-11-25 DIAGNOSIS — E782 Mixed hyperlipidemia: Secondary | ICD-10-CM | POA: Diagnosis not present

## 2017-11-25 DIAGNOSIS — R7301 Impaired fasting glucose: Secondary | ICD-10-CM | POA: Diagnosis not present

## 2017-11-25 DIAGNOSIS — M797 Fibromyalgia: Secondary | ICD-10-CM | POA: Diagnosis not present

## 2017-11-25 DIAGNOSIS — R635 Abnormal weight gain: Secondary | ICD-10-CM | POA: Diagnosis not present

## 2017-11-25 DIAGNOSIS — F411 Generalized anxiety disorder: Secondary | ICD-10-CM | POA: Diagnosis not present

## 2017-11-25 DIAGNOSIS — E559 Vitamin D deficiency, unspecified: Secondary | ICD-10-CM | POA: Diagnosis not present

## 2017-11-25 DIAGNOSIS — H6983 Other specified disorders of Eustachian tube, bilateral: Secondary | ICD-10-CM | POA: Diagnosis not present

## 2017-12-24 DIAGNOSIS — Z85828 Personal history of other malignant neoplasm of skin: Secondary | ICD-10-CM | POA: Diagnosis not present

## 2017-12-24 DIAGNOSIS — C44529 Squamous cell carcinoma of skin of other part of trunk: Secondary | ICD-10-CM | POA: Diagnosis not present

## 2017-12-24 DIAGNOSIS — Z08 Encounter for follow-up examination after completed treatment for malignant neoplasm: Secondary | ICD-10-CM | POA: Diagnosis not present

## 2017-12-24 DIAGNOSIS — L820 Inflamed seborrheic keratosis: Secondary | ICD-10-CM | POA: Diagnosis not present

## 2018-01-07 DIAGNOSIS — G5603 Carpal tunnel syndrome, bilateral upper limbs: Secondary | ICD-10-CM | POA: Diagnosis not present

## 2018-01-12 NOTE — Progress Notes (Signed)
Established Carotid Patient   History of Present Illness   Shannon [name] is a 52 y.o. (1966-07-30) female who presents with chief complaint: no complaints.  Patient has been seen in the past for RICA 03-00% stenosis, LICA 92-33% stenosis.  Patient has no history of TIA or stroke symptom.  The patient has never had amaurosis fugax or monocular blindness.  The patient has never had facial drooping or hemiplegia.  The patient has never had receptive or expressive aphasia.    The patient was a documented history of declining offered diagnostic and therapeutic interventions despite asking for a second opinion.  I recommended previously a carotid angiogram to investigate the left sub-total vs total occlusion in the L carotid artery.  She never scheduled the procedure and has not follow up since then.  The patient's PMH, PSH, SH, and FamHx were reviewed on 01/13/18 are unchanged from 01/12/18.  Current Outpatient Medications  Medication Sig Dispense Refill  . albuterol (PROVENTIL HFA;VENTOLIN HFA) 108 (90 Base) MCG/ACT inhaler Inhale 2 puffs into the lungs every 4 (four) hours as needed for wheezing or shortness of breath. 1 Inhaler 0  . ALPRAZolam (XANAX) 0.5 MG tablet Take 0.5 mg by mouth 2 (two) times daily as needed for anxiety.    . ALPRAZolam (XANAX) 1 MG tablet Take 1 mg by mouth 2 (two) times daily as needed.  2  . amoxicillin (AMOXIL) 500 MG capsule Take 1 capsule (500 mg total) by mouth 3 (three) times daily. 30 capsule 0  . amoxicillin-clavulanate (AUGMENTIN) 875-125 MG tablet TAKE 1 TABLET BY MOUTH EVERY 12 HOURS FOR 10 DAYS  0  . aspirin 81 MG tablet Take 81 mg by mouth daily.    Marland Kitchen azithromycin (ZITHROMAX) 250 MG tablet Take 1 po QD starting on Dec 26 4 tablet 0  . escitalopram (LEXAPRO) 10 MG tablet Take 10 mg by mouth daily.   5  . escitalopram (LEXAPRO) 20 MG tablet Take 1 tablet (20 mg total) by mouth daily. 30 tablet 0  . losartan (COZAAR) 25 MG tablet Take 1 tablet (25 mg  total) by mouth daily. 30 tablet 6  . nitroGLYCERIN (NITROSTAT) 0.4 MG SL tablet Place 1 tablet (0.4 mg total) under the tongue every 5 (five) minutes as needed for chest pain (up to 3 doses). 25 tablet 3  . propranolol (INDERAL) 10 MG tablet Take 10 mg by mouth 2 (two) times daily. Takes at night.    . rosuvastatin (CRESTOR) 10 MG tablet Take 1 tablet (10 mg total) by mouth at bedtime. 30 tablet 6  . vitamin B-12 (CYANOCOBALAMIN) 1000 MCG tablet Take 1,000 mcg by mouth daily.     No current facility-administered medications for this visit.     On ROS today: no CVA or TIA, no intermittent claudication    Physical Examination   Vitals:   01/13/18 1346  BP: 140/89  Pulse: 62  Resp: 16  Temp: (!) 97.2 F (36.2 C)  TempSrc: Oral  SpO2: 98%  Weight: 265 lb 14.4 oz (120.6 kg)  Height: 5\' 5"  (1.651 m)   Body mass index is 44.25 kg/m.  General Alert, O x 3, WD, NAD  Neck Supple, mid-line trachea,    Pulmonary Sym exp, good B air movt, CTA B  Cardiac RRR, Nl S1, S2, no Murmurs, No rubs, No S3,S4  Vascular Vessel Right Left  Radial Palpable Palpable  Brachial Palpable Palpable  Carotid Palpable, No Bruit Palpable, No Bruit  Aorta Not palpable N/A  Femoral Palpable Palpable  Popliteal Not palpable Not palpable  PT Not palpable Not palpable  DP Not palpable Not palpable    Gastro- intestinal soft, non-distended, non-tender to palpation, No guarding or rebound, no HSM, no masses, no CVAT B, No palpable prominent aortic pulse,    Musculo- skeletal M/S 5/5 throughout  , Extremities without ischemic changes    Neurologic Cranial nerves 2-12 intact , Pain and light touch intact in extremities , Motor exam as listed above    Non-Invasive Vascular Imaging   B Carotid Duplex (01/13/2018):   R ICA stenosis:  40-59%  R VA: patent and antegrade  L ICA stenosis:  40-59% (limited visualization, unable to duplicate previous >63%)  L VA: patent and antegrade   Medical Decision  Making   Shannon [name] is a 52 y.o. female who presents with: R asx ICA stenosis 40-59%, L asx ICA sub-total vs total occlusion   Due to technical limitation unable to visualize the L carotid artery well today.  Based on the patient's vascular studies and examination, I have offered the patient: L carotid angiogram.  If L carotid has sub-total occlusion, should purse L CEA.  If occluded already, no intervention is recommended. I discussed with the patient the nature of angiographic procedures, especially the limited patencies of any endovascular intervention.   The patient is aware of that the risks of an angiographic procedure include but are not limited to: bleeding, infection, access site complications, renal failure, embolization, rupture of vessel, dissection, arteriovenous fistula, possible need for emergent surgical intervention, possible need for surgical procedures to treat the patient's pathology, anaphylactic reaction to contrast, and stroke and death.   The patient is aware of the risks and does NOT agree to proceed.  She is considering proceeding.  I discussed in depth with the patient the nature of atherosclerosis, and emphasized the importance of maximal medical management including strict control of blood pressure, blood glucose, and lipid levels, antiplatelet agents, obtaining regular exercise, and cessation of smoking.    The patient is aware that without maximal medical management the underlying atherosclerotic disease process will progress, limiting the benefit of any interventions.  The patient is currently on a statin: Crestor.   The patient is currently on an anti-platelet: ASA.  Thank you for allowing [place] to participate in this patient's care.   Adele Barthel, MD, FACS [facility] Office: [phone] Pager: [phone]

## 2018-01-13 ENCOUNTER — Ambulatory Visit (HOSPITAL_COMMUNITY)
Admission: RE | Admit: 2018-01-13 | Discharge: 2018-01-13 | Disposition: A | Discharge status: Home / Self Care | Payer: Medicare HMO | Source: Ambulatory Visit | Attending: Vascular Surgery | Admitting: Vascular Surgery

## 2018-01-13 ENCOUNTER — Ambulatory Visit: Payer: Medicare HMO | Admitting: Vascular Surgery

## 2018-01-13 ENCOUNTER — Encounter: Payer: Self-pay | Admitting: Vascular Surgery

## 2018-01-13 VITALS — BP 140/89 | HR 62 | Temp 97.2°F | Resp 16 | Ht 65.0 in | Wt 265.9 lb

## 2018-01-13 DIAGNOSIS — I6523 Occlusion and stenosis of bilateral carotid arteries: Secondary | ICD-10-CM

## 2018-01-28 DIAGNOSIS — G4719 Other hypersomnia: Secondary | ICD-10-CM | POA: Diagnosis not present

## 2018-02-15 DIAGNOSIS — J40 Bronchitis, not specified as acute or chronic: Secondary | ICD-10-CM | POA: Diagnosis not present

## 2018-02-15 DIAGNOSIS — J0100 Acute maxillary sinusitis, unspecified: Secondary | ICD-10-CM | POA: Diagnosis not present

## 2018-02-25 ENCOUNTER — Ambulatory Visit: Payer: Self-pay | Admitting: Cardiovascular Disease

## 2018-03-03 DIAGNOSIS — F17200 Nicotine dependence, unspecified, uncomplicated: Secondary | ICD-10-CM | POA: Diagnosis not present

## 2018-03-03 DIAGNOSIS — F411 Generalized anxiety disorder: Secondary | ICD-10-CM | POA: Diagnosis not present

## 2018-03-03 DIAGNOSIS — R600 Localized edema: Secondary | ICD-10-CM | POA: Diagnosis not present

## 2018-03-03 DIAGNOSIS — J40 Bronchitis, not specified as acute or chronic: Secondary | ICD-10-CM | POA: Diagnosis not present

## 2018-03-03 DIAGNOSIS — R635 Abnormal weight gain: Secondary | ICD-10-CM | POA: Diagnosis not present

## 2018-03-03 DIAGNOSIS — R062 Wheezing: Secondary | ICD-10-CM | POA: Diagnosis not present

## 2018-03-03 DIAGNOSIS — I10 Essential (primary) hypertension: Secondary | ICD-10-CM | POA: Diagnosis not present

## 2018-03-03 DIAGNOSIS — I6529 Occlusion and stenosis of unspecified carotid artery: Secondary | ICD-10-CM | POA: Diagnosis not present

## 2018-03-03 DIAGNOSIS — R05 Cough: Secondary | ICD-10-CM | POA: Diagnosis not present

## 2018-03-08 ENCOUNTER — Encounter: Payer: Self-pay | Admitting: *Deleted

## 2018-03-08 ENCOUNTER — Encounter

## 2018-03-08 ENCOUNTER — Ambulatory Visit: Payer: Medicare HMO | Admitting: Cardiovascular Disease

## 2018-03-08 ENCOUNTER — Encounter: Payer: Self-pay | Admitting: Cardiovascular Disease

## 2018-03-08 VITALS — BP 130/84 | HR 57 | Ht 65.5 in | Wt 272.3 lb

## 2018-03-08 DIAGNOSIS — I25118 Atherosclerotic heart disease of native coronary artery with other forms of angina pectoris: Secondary | ICD-10-CM | POA: Diagnosis not present

## 2018-03-08 DIAGNOSIS — Z716 Tobacco abuse counseling: Secondary | ICD-10-CM

## 2018-03-08 DIAGNOSIS — J441 Chronic obstructive pulmonary disease with (acute) exacerbation: Secondary | ICD-10-CM | POA: Diagnosis not present

## 2018-03-08 DIAGNOSIS — E785 Hyperlipidemia, unspecified: Secondary | ICD-10-CM | POA: Diagnosis not present

## 2018-03-08 DIAGNOSIS — R600 Localized edema: Secondary | ICD-10-CM | POA: Diagnosis not present

## 2018-03-08 DIAGNOSIS — I252 Old myocardial infarction: Secondary | ICD-10-CM | POA: Diagnosis not present

## 2018-03-08 DIAGNOSIS — I10 Essential (primary) hypertension: Secondary | ICD-10-CM

## 2018-03-08 DIAGNOSIS — I6523 Occlusion and stenosis of bilateral carotid arteries: Secondary | ICD-10-CM

## 2018-03-08 DIAGNOSIS — Z955 Presence of coronary angioplasty implant and graft: Secondary | ICD-10-CM | POA: Diagnosis not present

## 2018-03-08 NOTE — Patient Instructions (Signed)
Medication Instructions:  Your physician recommends that you continue on your current medications as directed. Please refer to the Current Medication list given to you today.   Labwork: NONE  Testing/Procedures: Your physician has requested that you have an echocardiogram. Echocardiography is a painless test that uses sound waves to create images of your heart. It provides your doctor with information about the size and shape of your heart and how well your heart's chambers and valves are working. This procedure takes approximately one hour. There are no restrictions for this procedure.    Follow-Up: Your physician wants you to follow-up in: 6 Months with Dr. Koneswaran. You will receive a reminder letter in the mail two months in advance. If you don't receive a letter, please call our office to schedule the follow-up appointment.   Any Other Special Instructions Will Be Listed Below (If Applicable).     If you need a refill on your cardiac medications before your next appointment, please call your pharmacy.  Thank you for choosing [facility]!   

## 2018-03-08 NOTE — Progress Notes (Signed)
SUBJECTIVE: The patient presents for past due follow up. She has a history of coronary artery disease, hypertension, tobacco abuse, morbid obesity, and hyperlipidemia. She has a history of a non-STEMI in February 2014 and underwent drug-eluting stent placement to the left circumflex coronary artery.  She has a history of anxiety and depression.   An echocardiogram on 05/25/13 demonstrated "probably normal left ventricular systolic function" but they were poor quality images.   Carotid Dopplers 11/07/16 showed 40-59% right sided carotid artery stenosis with occlusion of the left side. She saw vascular surgery who felt that the right sided velocities were falsely elevated because of contralateral occlusion and stenosis is probably closer to 40%. He recommended repeat Dopplers in 6 months.  She smokes 1.5 packs of cigarettes daily.  Nuclear stress test 07/25/15: Normal, LVEF 56%.  She has chronic exertional dyspnea which is stable.  She denies chest pain.  She was at the beach last week and developed bilateral calf and ankle swelling.  It took a few days after returning home for symptoms to resolve.  She then saw her PCP and the patient tells me that BNP and chest x-ray were okay.  She has a lot of questions regarding carotid artery stenosis and is frustrated by varying ultrasonographic findings.  She requests a second opinion.     Review of Systems: As per "subjective", otherwise negative.  Allergies  Allergen Reactions  . Morphine And Related Other (See Comments)    PATIENT REFUSES THIS MEDICATION: states that she does not tolerate this medication well    Current Outpatient Medications  Medication Sig Dispense Refill  . albuterol (PROVENTIL HFA;VENTOLIN HFA) 108 (90 Base) MCG/ACT inhaler Inhale 2 puffs into the lungs every 4 (four) hours as needed for wheezing or shortness of breath. 1 Inhaler 0  . ALPRAZolam (XANAX) 1 MG tablet Take 1 mg by mouth 2 (two) times daily as  needed.  2  . aspirin EC 81 MG tablet Take by mouth.    . bisoprolol-hydrochlorothiazide (ZIAC) 5-6.25 MG tablet Take 1 tablet by mouth daily.    Marland Kitchen escitalopram (LEXAPRO) 20 MG tablet Take 1 tablet (20 mg total) by mouth daily. 30 tablet 0  . fluticasone (FLONASE) 50 MCG/ACT nasal spray fluticasone propionate 50 mcg/actuation nasal spray,suspension  SPRAY 2 SPRAYS INTO EACH NOSTRIL EVERY DAY    . furosemide (LASIX) 20 MG tablet Take 1 tablet by mouth daily as needed.    Marland Kitchen ibuprofen (ADVIL,MOTRIN) 200 MG tablet Take 200 mg by mouth every 6 (six) hours as needed.     Marland Kitchen ipratropium-albuterol (DUONEB) 0.5-2.5 (3) MG/3ML SOLN Inhale 3 mLs into the lungs every 4 (four) hours as needed.    . nitroGLYCERIN (NITROSTAT) 0.4 MG SL tablet Place 1 tablet (0.4 mg total) under the tongue every 5 (five) minutes as needed for chest pain (up to 3 doses). 25 tablet 3  . rosuvastatin (CRESTOR) 10 MG tablet Take 1 tablet (10 mg total) by mouth at bedtime. 30 tablet 6  . nicotine (NICODERM CQ - DOSED IN MG/24 HOURS) 14 mg/24hr patch nicotine 14 mg/24 hr daily transdermal patch  APPLY 1 PATCH EVERY DAY AS DIRECTED    . vitamin B-12 (CYANOCOBALAMIN) 1000 MCG tablet Take 1,000 mcg by mouth daily.     No current facility-administered medications for this visit.     Past Medical History:  Diagnosis Date  . Anemia   . Anxiety   . Carotid artery occlusion   . COPD (chronic obstructive  pulmonary disease) ([place])   . Coronary artery disease    LHC 11/25/12 90% stenosis mid LCx & otherwise nonobstructive dz w/ EF 60-65% S/p PTCA/DES to LCx  . Depression   . Exertional dyspnea    chronic  . History of cardiovascular stress test 06/2015   low risk  . Hyperlipidemia   . Hypertension   . MI (myocardial infarction) ([place])   . Tobacco abuse     Past Surgical History:  Procedure Laterality Date  . CORONARY ANGIOPLASTY WITH STENT PLACEMENT    . LEFT HEART CATHETERIZATION WITH CORONARY ANGIOGRAM N/A 11/15/2012    Procedure: LEFT HEART CATHETERIZATION WITH CORONARY ANGIOGRAM;  Surgeon: Thayer Headings, MD;  Location[facility] CATH LAB;  Service: Cardiovascular;  Laterality: N/A;  . PERCUTANEOUS CORONARY STENT INTERVENTION (PCI-S)  11/15/2012   Procedure: PERCUTANEOUS CORONARY STENT INTERVENTION (PCI-S);  Surgeon: Thayer Headings, MD;  Location: [facility] CATH LAB;  Service: Cardiovascular;;  . skin cancer removed right lower leg Right   . TONSILLECTOMY    . uterus ablation      Social History   Socioeconomic History  . Marital status: Legally Separated    Spouse name: Not on file  . Number of children: Not on file  . Years of education: Not on file  . Highest education level: Not on file  Occupational History  . Not on file  Social Needs  . Financial resource strain: Not on file  . Food insecurity:    Worry: Not on file    Inability: Not on file  . Transportation needs:    Medical: Not on file    Non-medical: Not on file  Tobacco Use  . Smoking status: Current Every Day Smoker    Packs/day: 2.00    Years: 32.00    Pack years: 64.00    Types: Cigarettes    Start date: 03/29/1979  . Smokeless tobacco: Never Used  . Tobacco comment: Currently 2ppd  Substance and Sexual Activity  . Alcohol use: No    Alcohol/week: 0.0 oz  . Drug use: No  . Sexual activity: Not Currently    Partners: Male  Lifestyle  . Physical activity:    Days per week: Not on file    Minutes per session: Not on file  . Stress: Not on file  Relationships  . Social connections:    Talks on phone: Not on file    Gets together: Not on file    Attends religious service: Not on file    Active member of club or organization: Not on file    Attends meetings of clubs or organizations: Not on file    Relationship status: Not on file  . Intimate partner violence:    Fear of current or ex partner: Not on file    Emotionally abused: Not on file    Physically abused: Not on file    Forced sexual activity: Not on file  Other Topics  Concern  . Not on file  Social History Narrative   Lives with husband in a 3 story home.  Has no children.     On disability.     Education: high school, some college.     Vitals:   03/08/18 1057  BP: 130/84  Pulse: (!) 57  SpO2: 97%  Weight: 272 lb 4.8 oz (123.5 kg)  Height: 5' 5.5" (1.664 m)    Wt Readings from Last 3 Encounters:  03/08/18 272 lb 4.8 oz (123.5 kg)  01/13/18 265 lb 14.4 oz (  120.6 kg)  09/22/17 260 lb (117.9 kg)     PHYSICAL EXAM General: NAD HEENT: Normal. Neck: No JVD, no thyromegaly. Lungs: Diminished throughout, no crackles or wheezes. CV: Regular rate and rhythm, normal S1/S2, no S3/S4, no murmur.  No pretibial nor periankle edema.   Abdomen: Soft, nontender, obese.  Neurologic: Alert and oriented.  Psych: Normal affect. Skin: Normal. Musculoskeletal: No gross deformities.    ECG: Most recent ECG reviewed.   Labs: Lab Results  Component Value Date/Time   K 3.7 09/22/2017 01:12 AM   BUN 10 09/22/2017 01:12 AM   CREATININE 0.90 09/22/2017 01:12 AM   ALT 20 03/21/2017 07:38 PM   HGB 15.3 (H) 09/22/2017 01:12 AM     Lipids: Lab Results  Component Value Date/Time   LDLDIRECT 200.3 09/04/2009 01:08 PM   CHOL 294 (H) 09/04/2009 01:08 PM   TRIG 244.0 (H) 09/04/2009 01:08 PM   HDL 39.00 (L) 09/04/2009 01:08 PM       ASSESSMENT AND PLAN:  1. CAD with left circumflexstent and prior NSTEMI: Symptomatically stable. NormalLexiscan Cardiolite stress test 06/2015. Continue aspirin, bisoprolol, and Crestor.   2. Essential HTN:  Controlled on present therapy.  No changes.  3. Dyslipidemia:  I will obtain a copy of lipids from PCP.  Continue Crestor 10 mg.  4. Carotid artery stenosis:  She follows with vascular surgery who recommended angiography but she decided not to proceed. Continue aspirin and statin. She has a lot of questions regarding carotid artery stenosis and is frustrated by varying ultrasonographic findings.  She requests a  second opinion.  I told her that angiography regardless of the provider would likely be recommended.  5. Tobacco abuse: Cessation counseling againprovided.  6.  Bilateral leg edema: I will obtain an echocardiogram to evaluate cardiac structure and function.  I will obtain a copy of labs and chest x-ray report from PCP.   Disposition: Follow up 6 months   Kate Sable, M.D., F.A.C.C.

## 2018-03-11 ENCOUNTER — Ambulatory Visit (HOSPITAL_COMMUNITY)
Admission: RE | Admit: 2018-03-11 | Discharge: 2018-03-11 | Disposition: A | Discharge status: Home / Self Care | Payer: Medicare HMO | Source: Ambulatory Visit | Attending: Cardiovascular Disease | Admitting: Cardiovascular Disease

## 2018-03-11 DIAGNOSIS — R600 Localized edema: Secondary | ICD-10-CM | POA: Insufficient documentation

## 2018-03-11 DIAGNOSIS — E785 Hyperlipidemia, unspecified: Secondary | ICD-10-CM | POA: Diagnosis not present

## 2018-03-11 DIAGNOSIS — Z720 Tobacco use: Secondary | ICD-10-CM | POA: Diagnosis not present

## 2018-03-11 DIAGNOSIS — I25119 Atherosclerotic heart disease of native coronary artery with unspecified angina pectoris: Secondary | ICD-10-CM | POA: Insufficient documentation

## 2018-03-11 DIAGNOSIS — I10 Essential (primary) hypertension: Secondary | ICD-10-CM | POA: Diagnosis not present

## 2018-03-11 MED ORDER — PERFLUTREN LIPID MICROSPHERE
1.0000 mL | INTRAVENOUS | Status: AC | PRN
  Administered 2018-03-11: 2 mL via INTRAVENOUS
  Filled 2018-03-11: qty 10

## 2018-03-11 NOTE — Progress Notes (Signed)
*  PRELIMINARY RESULTS* Echocardiogram 2D Echocardiogramdefinity has been performed.  Leavy Cella 03/11/2018, 3:40 PM

## 2018-03-17 DIAGNOSIS — G4733 Obstructive sleep apnea (adult) (pediatric): Secondary | ICD-10-CM | POA: Diagnosis not present

## 2018-03-20 DIAGNOSIS — G4733 Obstructive sleep apnea (adult) (pediatric): Secondary | ICD-10-CM | POA: Diagnosis not present

## 2018-03-23 ENCOUNTER — Telehealth: Payer: Self-pay | Admitting: Cardiovascular Disease

## 2018-03-23 NOTE — Telephone Encounter (Signed)
Results of echo/ tg

## 2018-03-23 NOTE — Telephone Encounter (Signed)
Will forward to Dr. Bronson Ing to result.

## 2018-03-24 NOTE — Telephone Encounter (Signed)
Called pt., no answer. Left message for pt to return call.  

## 2018-03-24 NOTE — Telephone Encounter (Signed)
PT notified of results. Voiced understanding.

## 2018-03-24 NOTE — Telephone Encounter (Signed)
Normal cardiac function.

## 2018-04-07 DIAGNOSIS — J441 Chronic obstructive pulmonary disease with (acute) exacerbation: Secondary | ICD-10-CM | POA: Diagnosis not present

## 2018-05-08 DIAGNOSIS — J441 Chronic obstructive pulmonary disease with (acute) exacerbation: Secondary | ICD-10-CM | POA: Diagnosis not present

## 2018-05-18 DIAGNOSIS — R7302 Impaired glucose tolerance (oral): Secondary | ICD-10-CM | POA: Diagnosis not present

## 2018-05-18 DIAGNOSIS — I272 Pulmonary hypertension, unspecified: Secondary | ICD-10-CM | POA: Diagnosis not present

## 2018-05-18 DIAGNOSIS — R635 Abnormal weight gain: Secondary | ICD-10-CM | POA: Diagnosis not present

## 2018-05-18 DIAGNOSIS — Z1212 Encounter for screening for malignant neoplasm of rectum: Secondary | ICD-10-CM | POA: Diagnosis not present

## 2018-05-18 DIAGNOSIS — Z6841 Body Mass Index (BMI) 40.0 and over, adult: Secondary | ICD-10-CM | POA: Diagnosis not present

## 2018-05-18 DIAGNOSIS — Z124 Encounter for screening for malignant neoplasm of cervix: Secondary | ICD-10-CM | POA: Diagnosis not present

## 2018-05-18 DIAGNOSIS — N951 Menopausal and female climacteric states: Secondary | ICD-10-CM | POA: Diagnosis not present

## 2018-05-18 DIAGNOSIS — R140 Abdominal distension (gaseous): Secondary | ICD-10-CM | POA: Diagnosis not present

## 2018-05-18 DIAGNOSIS — N952 Postmenopausal atrophic vaginitis: Secondary | ICD-10-CM | POA: Diagnosis not present

## 2018-05-18 DIAGNOSIS — R5383 Other fatigue: Secondary | ICD-10-CM | POA: Diagnosis not present

## 2018-05-18 DIAGNOSIS — Z01419 Encounter for gynecological examination (general) (routine) without abnormal findings: Secondary | ICD-10-CM | POA: Diagnosis not present

## 2018-05-27 DIAGNOSIS — G5603 Carpal tunnel syndrome, bilateral upper limbs: Secondary | ICD-10-CM | POA: Diagnosis not present

## 2018-06-08 DIAGNOSIS — J441 Chronic obstructive pulmonary disease with (acute) exacerbation: Secondary | ICD-10-CM | POA: Diagnosis not present

## 2018-07-08 DIAGNOSIS — J441 Chronic obstructive pulmonary disease with (acute) exacerbation: Secondary | ICD-10-CM | POA: Diagnosis not present

## 2018-07-12 DIAGNOSIS — G5603 Carpal tunnel syndrome, bilateral upper limbs: Secondary | ICD-10-CM | POA: Diagnosis not present

## 2018-08-08 DIAGNOSIS — J441 Chronic obstructive pulmonary disease with (acute) exacerbation: Secondary | ICD-10-CM | POA: Diagnosis not present

## 2018-08-09 ENCOUNTER — Encounter: Payer: Self-pay | Admitting: Physician Assistant

## 2018-08-09 ENCOUNTER — Encounter (HOSPITAL_COMMUNITY)
Admission: EM | Disposition: A | Discharge status: Home / Self Care | Payer: Self-pay | Source: Home / Self Care | Attending: Emergency Medicine

## 2018-08-09 ENCOUNTER — Ambulatory Visit: Payer: Medicare HMO | Admitting: Physician Assistant

## 2018-08-09 ENCOUNTER — Observation Stay (HOSPITAL_COMMUNITY)
Admission: EM | Admit: 2018-08-09 | Discharge: 2018-08-10 | Disposition: A | Discharge status: Home / Self Care | Payer: Medicare HMO | Attending: Internal Medicine | Admitting: Internal Medicine

## 2018-08-09 ENCOUNTER — Emergency Department (HOSPITAL_COMMUNITY): Payer: Medicare HMO

## 2018-08-09 ENCOUNTER — Other Ambulatory Visit: Payer: Self-pay

## 2018-08-09 ENCOUNTER — Encounter (HOSPITAL_COMMUNITY): Payer: Self-pay

## 2018-08-09 VITALS — BP 128/82 | HR 81 | Ht 65.0 in | Wt 282.0 lb

## 2018-08-09 DIAGNOSIS — J449 Chronic obstructive pulmonary disease, unspecified: Secondary | ICD-10-CM | POA: Diagnosis not present

## 2018-08-09 DIAGNOSIS — Z8249 Family history of ischemic heart disease and other diseases of the circulatory system: Secondary | ICD-10-CM | POA: Diagnosis not present

## 2018-08-09 DIAGNOSIS — I2584 Coronary atherosclerosis due to calcified coronary lesion: Secondary | ICD-10-CM | POA: Diagnosis not present

## 2018-08-09 DIAGNOSIS — I10 Essential (primary) hypertension: Secondary | ICD-10-CM | POA: Diagnosis present

## 2018-08-09 DIAGNOSIS — Z79899 Other long term (current) drug therapy: Secondary | ICD-10-CM | POA: Insufficient documentation

## 2018-08-09 DIAGNOSIS — Z85828 Personal history of other malignant neoplasm of skin: Secondary | ICD-10-CM | POA: Diagnosis not present

## 2018-08-09 DIAGNOSIS — E782 Mixed hyperlipidemia: Secondary | ICD-10-CM | POA: Diagnosis not present

## 2018-08-09 DIAGNOSIS — I25110 Atherosclerotic heart disease of native coronary artery with unstable angina pectoris: Secondary | ICD-10-CM | POA: Diagnosis not present

## 2018-08-09 DIAGNOSIS — F17200 Nicotine dependence, unspecified, uncomplicated: Secondary | ICD-10-CM | POA: Diagnosis not present

## 2018-08-09 DIAGNOSIS — Z9889 Other specified postprocedural states: Secondary | ICD-10-CM | POA: Diagnosis not present

## 2018-08-09 DIAGNOSIS — E785 Hyperlipidemia, unspecified: Secondary | ICD-10-CM | POA: Diagnosis not present

## 2018-08-09 DIAGNOSIS — I6523 Occlusion and stenosis of bilateral carotid arteries: Secondary | ICD-10-CM

## 2018-08-09 DIAGNOSIS — Z955 Presence of coronary angioplasty implant and graft: Secondary | ICD-10-CM | POA: Insufficient documentation

## 2018-08-09 DIAGNOSIS — Z7951 Long term (current) use of inhaled steroids: Secondary | ICD-10-CM | POA: Insufficient documentation

## 2018-08-09 DIAGNOSIS — R079 Chest pain, unspecified: Secondary | ICD-10-CM

## 2018-08-09 DIAGNOSIS — Z885 Allergy status to narcotic agent status: Secondary | ICD-10-CM | POA: Insufficient documentation

## 2018-08-09 DIAGNOSIS — K219 Gastro-esophageal reflux disease without esophagitis: Secondary | ICD-10-CM | POA: Diagnosis not present

## 2018-08-09 DIAGNOSIS — I5032 Chronic diastolic (congestive) heart failure: Secondary | ICD-10-CM | POA: Insufficient documentation

## 2018-08-09 DIAGNOSIS — I110 Hypertensive heart disease with heart failure: Secondary | ICD-10-CM | POA: Diagnosis not present

## 2018-08-09 DIAGNOSIS — Z23 Encounter for immunization: Secondary | ICD-10-CM | POA: Insufficient documentation

## 2018-08-09 DIAGNOSIS — Z6841 Body Mass Index (BMI) 40.0 and over, adult: Secondary | ICD-10-CM | POA: Diagnosis not present

## 2018-08-09 DIAGNOSIS — I252 Old myocardial infarction: Secondary | ICD-10-CM | POA: Diagnosis not present

## 2018-08-09 DIAGNOSIS — I200 Unstable angina: Secondary | ICD-10-CM | POA: Diagnosis present

## 2018-08-09 DIAGNOSIS — F17210 Nicotine dependence, cigarettes, uncomplicated: Secondary | ICD-10-CM | POA: Diagnosis not present

## 2018-08-09 DIAGNOSIS — Z7982 Long term (current) use of aspirin: Secondary | ICD-10-CM | POA: Insufficient documentation

## 2018-08-09 DIAGNOSIS — I2510 Atherosclerotic heart disease of native coronary artery without angina pectoris: Secondary | ICD-10-CM | POA: Diagnosis present

## 2018-08-09 DIAGNOSIS — I25118 Atherosclerotic heart disease of native coronary artery with other forms of angina pectoris: Secondary | ICD-10-CM | POA: Diagnosis not present

## 2018-08-09 HISTORY — DX: Chronic diastolic (congestive) heart failure: I50.32

## 2018-08-09 HISTORY — PX: LEFT HEART CATH AND CORONARY ANGIOGRAPHY: CATH118249

## 2018-08-09 LAB — CBC
HCT: 46.3 % — ABNORMAL HIGH (ref 36.0–46.0)
Hemoglobin: 14.5 g/dL (ref 12.0–15.0)
MCH: 27.6 pg (ref 26.0–34.0)
MCHC: 31.3 g/dL (ref 30.0–36.0)
MCV: 88.2 fL (ref 80.0–100.0)
NRBC: 0 % (ref 0.0–0.2)
PLATELETS: 154 10*3/uL (ref 150–400)
RBC: 5.25 MIL/uL — ABNORMAL HIGH (ref 3.87–5.11)
RDW: 14.8 % (ref 11.5–15.5)
WBC: 8.7 10*3/uL (ref 4.0–10.5)

## 2018-08-09 LAB — BASIC METABOLIC PANEL
Anion gap: 7 (ref 5–15)
BUN: 11 mg/dL (ref 6–20)
CALCIUM: 9.1 mg/dL (ref 8.9–10.3)
CO2: 27 mmol/L (ref 22–32)
CREATININE: 0.91 mg/dL (ref 0.44–1.00)
Chloride: 103 mmol/L (ref 98–111)
Glucose, Bld: 115 mg/dL — ABNORMAL HIGH (ref 70–99)
Potassium: 4.3 mmol/L (ref 3.5–5.1)
Sodium: 137 mmol/L (ref 135–145)

## 2018-08-09 LAB — TROPONIN I: Troponin I: <0.03 ng/mL (ref <0.03)

## 2018-08-09 LAB — POC URINE PREG, ED: Preg Test, Ur: NEGATIVE

## 2018-08-09 IMAGING — CR DG CHEST 1V PORT
1 series · 1 of 1 positions shown · non-contrast
Comparison: [DATE]

CLINICAL DATA: Chest pain.  Smoker.

EXAM:
PORTABLE CHEST 1 VIEW

[portable]
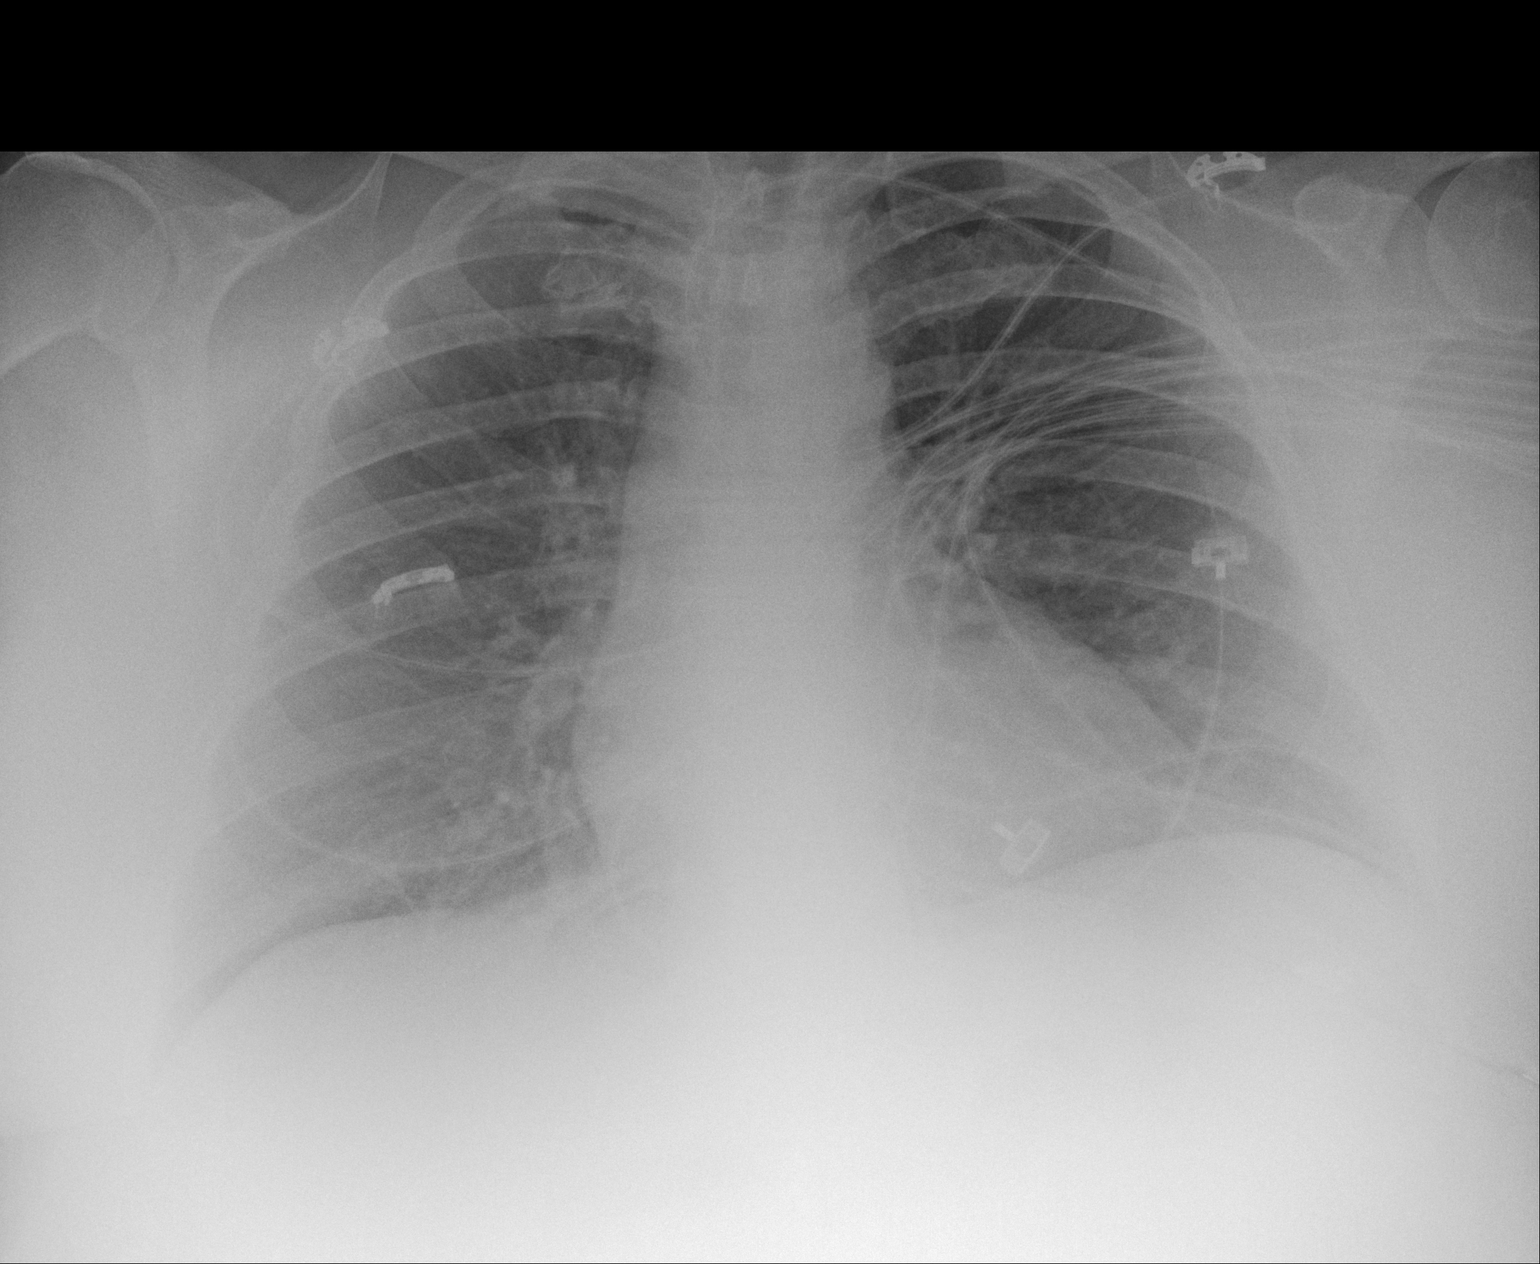

[1 of 1 positions shown; findings below may reference images not displayed]

FINDINGS: The heart size and mediastinal contours are within normal limits.
Both lungs are clear. The visualized skeletal structures are
unremarkable.
IMPRESSION: No active disease.

## 2018-08-09 SURGERY — LEFT HEART CATH AND CORONARY ANGIOGRAPHY
Anesthesia: LOCAL

## 2018-08-09 MED ORDER — VERAPAMIL HCL 2.5 MG/ML IV SOLN
INTRAVENOUS | Status: DC | PRN
  Administered 2018-08-09: 10 mL via INTRA_ARTERIAL

## 2018-08-09 MED ORDER — VERAPAMIL HCL 2.5 MG/ML IV SOLN
INTRAVENOUS | Status: AC
  Filled 2018-08-09: qty 2

## 2018-08-09 MED ORDER — HEPARIN BOLUS VIA INFUSION
4000.0000 [IU] | Freq: Once | INTRAVENOUS | Status: AC
  Administered 2018-08-09: 4000 [IU] via INTRAVENOUS

## 2018-08-09 MED ORDER — SODIUM CHLORIDE 0.9% FLUSH: 3.0000 mL | INTRAVENOUS | Status: DC | PRN

## 2018-08-09 MED ORDER — ESCITALOPRAM OXALATE 10 MG PO TABS
20.0000 mg | ORAL_TABLET | Freq: Every day | ORAL | Status: DC
  Administered 2018-08-09: 10 mg via ORAL
  Filled 2018-08-09: qty 2

## 2018-08-09 MED ORDER — SODIUM CHLORIDE 0.9 % WEIGHT BASED INFUSION: 1.0000 mL/kg/h | INTRAVENOUS | Status: DC

## 2018-08-09 MED ORDER — ASPIRIN EC 81 MG PO TBEC
81.0000 mg | DELAYED_RELEASE_TABLET | Freq: Every day | ORAL | Status: DC
  Administered 2018-08-10: 81 mg via ORAL
  Filled 2018-08-09: qty 1

## 2018-08-09 MED ORDER — ALPRAZOLAM 0.5 MG PO TABS
1.0000 mg | ORAL_TABLET | Freq: Two times a day (BID) | ORAL | Status: DC | PRN
  Administered 2018-08-09: 1 mg via ORAL
  Filled 2018-08-09: qty 2

## 2018-08-09 MED ORDER — SODIUM CHLORIDE 0.9 % IV SOLN: 250.0000 mL | INTRAVENOUS | Status: DC | PRN

## 2018-08-09 MED ORDER — IOHEXOL 350 MG/ML SOLN
INTRAVENOUS | Status: DC | PRN
  Administered 2018-08-09: 55 mL via INTRAVENOUS

## 2018-08-09 MED ORDER — HEPARIN (PORCINE) IN NACL 1000-0.9 UT/500ML-% IV SOLN
INTRAVENOUS | Status: DC | PRN
  Administered 2018-08-09 (×2): 500 mL

## 2018-08-09 MED ORDER — FENTANYL CITRATE (PF) 100 MCG/2ML IJ SOLN
INTRAMUSCULAR | Status: AC
  Filled 2018-08-09: qty 2

## 2018-08-09 MED ORDER — HEPARIN SODIUM (PORCINE) 1000 UNIT/ML IJ SOLN
INTRAMUSCULAR | Status: DC | PRN
  Administered 2018-08-09: 5000 [IU] via INTRAVENOUS

## 2018-08-09 MED ORDER — NITROGLYCERIN 0.4 MG SL SUBL: 0.4000 mg | SUBLINGUAL_TABLET | SUBLINGUAL | Status: DC | PRN

## 2018-08-09 MED ORDER — MIDAZOLAM HCL 2 MG/2ML IJ SOLN
INTRAMUSCULAR | Status: AC
  Filled 2018-08-09: qty 2

## 2018-08-09 MED ORDER — ONDANSETRON HCL 4 MG/2ML IJ SOLN: 4.0000 mg | Freq: Four times a day (QID) | INTRAMUSCULAR | Status: DC | PRN

## 2018-08-09 MED ORDER — HEPARIN (PORCINE) IN NACL 1000-0.9 UT/500ML-% IV SOLN
INTRAVENOUS | Status: AC
  Filled 2018-08-09: qty 1000

## 2018-08-09 MED ORDER — SODIUM CHLORIDE 0.9 % WEIGHT BASED INFUSION
3.0000 mL/kg/h | INTRAVENOUS | Status: DC
  Administered 2018-08-09 (×2): 3 mL/kg/h via INTRAVENOUS

## 2018-08-09 MED ORDER — HYDROMORPHONE HCL 1 MG/ML IJ SOLN
0.5000 mg | Freq: Once | INTRAMUSCULAR | Status: AC
  Administered 2018-08-09: 0.5 mg via INTRAVENOUS
  Filled 2018-08-09: qty 1

## 2018-08-09 MED ORDER — ACETAMINOPHEN 325 MG PO TABS
650.0000 mg | ORAL_TABLET | ORAL | Status: DC | PRN
  Administered 2018-08-09 (×2): 650 mg via ORAL
  Filled 2018-08-09 (×2): qty 2

## 2018-08-09 MED ORDER — HEPARIN (PORCINE) 25000 UT/250ML-% IV SOLN
1050.0000 [IU]/h | INTRAVENOUS | Status: DC
  Administered 2018-08-09: 1050 [IU]/h via INTRAVENOUS
  Filled 2018-08-09: qty 250

## 2018-08-09 MED ORDER — LIDOCAINE HCL (PF) 1 % IJ SOLN
INTRAMUSCULAR | Status: AC
  Filled 2018-08-09: qty 30

## 2018-08-09 MED ORDER — ENOXAPARIN SODIUM 40 MG/0.4ML ~~LOC~~ SOLN: 40.0000 mg | SUBCUTANEOUS | Status: DC

## 2018-08-09 MED ORDER — SODIUM CHLORIDE 0.9% FLUSH
3.0000 mL | Freq: Two times a day (BID) | INTRAVENOUS | Status: DC
  Administered 2018-08-09: 3 mL via INTRAVENOUS

## 2018-08-09 MED ORDER — ROSUVASTATIN CALCIUM 10 MG PO TABS
10.0000 mg | ORAL_TABLET | Freq: Every day | ORAL | Status: DC
  Administered 2018-08-09: 10 mg via ORAL
  Filled 2018-08-09: qty 1

## 2018-08-09 MED ORDER — IPRATROPIUM-ALBUTEROL 0.5-2.5 (3) MG/3ML IN SOLN: 3.0000 mL | RESPIRATORY_TRACT | Status: DC | PRN

## 2018-08-09 MED ORDER — FUROSEMIDE 20 MG PO TABS
20.0000 mg | ORAL_TABLET | Freq: Every day | ORAL | Status: DC
  Administered 2018-08-10: 20 mg via ORAL
  Filled 2018-08-09: qty 1

## 2018-08-09 MED ORDER — ASPIRIN 81 MG PO CHEW
324.0000 mg | CHEWABLE_TABLET | Freq: Once | ORAL | Status: AC
  Administered 2018-08-09: 324 mg via ORAL
  Filled 2018-08-09: qty 4

## 2018-08-09 MED ORDER — LIDOCAINE HCL (PF) 1 % IJ SOLN
INTRAMUSCULAR | Status: DC | PRN
  Administered 2018-08-09: 2 mL

## 2018-08-09 MED ORDER — INFLUENZA VAC SPLIT QUAD 0.5 ML IM SUSY
0.5000 mL | PREFILLED_SYRINGE | INTRAMUSCULAR | Status: AC
  Administered 2018-08-10: 0.5 mL via INTRAMUSCULAR
  Filled 2018-08-09: qty 0.5

## 2018-08-09 MED ORDER — NITROGLYCERIN IN D5W 200-5 MCG/ML-% IV SOLN
0.0000 ug/min | INTRAVENOUS | Status: DC
  Filled 2018-08-09: qty 250

## 2018-08-09 MED ORDER — BISOPROLOL FUMARATE 5 MG PO TABS: 5.0000 mg | ORAL_TABLET | Freq: Every day | ORAL | Status: DC

## 2018-08-09 MED ORDER — MIDAZOLAM HCL 2 MG/2ML IJ SOLN
INTRAMUSCULAR | Status: DC | PRN
  Administered 2018-08-09 (×2): 1 mg via INTRAVENOUS

## 2018-08-09 MED ORDER — FENTANYL CITRATE (PF) 100 MCG/2ML IJ SOLN
INTRAMUSCULAR | Status: DC | PRN
  Administered 2018-08-09 (×2): 25 ug via INTRAVENOUS

## 2018-08-09 SURGICAL SUPPLY — 13 items
CATH INFINITI 5FR ANG PIGTAIL (CATHETERS) ×1 IMPLANT
CATH OPTITORQUE TIG 4.0 5F (CATHETERS) ×2 IMPLANT
DEVICE RAD COMP TR BAND LRG (VASCULAR PRODUCTS) ×1 IMPLANT
GLIDESHEATH SLEND SS 6F .021 (SHEATH) ×1 IMPLANT
GUIDEWIRE INQWIRE 1.5J.035X260 (WIRE) IMPLANT
HOVERMATT SINGLE USE (MISCELLANEOUS) ×2 IMPLANT
INQWIRE 1.5J .035X260CM (WIRE) ×2
KIT HEART LEFT (KITS) ×2 IMPLANT
PACK CARDIAC CATHETERIZATION (CUSTOM PROCEDURE TRAY) ×2 IMPLANT
SHEATH PROBE COVER 6X72 (BAG) ×1 IMPLANT
SYR MEDRAD MARK V 150ML (SYRINGE) ×2 IMPLANT
TRANSDUCER W/STOPCOCK (MISCELLANEOUS) ×2 IMPLANT
TUBING CIL FLEX 10 FLL-RA (TUBING) ×2 IMPLANT

## 2018-08-09 NOTE — Brief Op Note (Signed)
BRIEF CARDIAC CATHETERIZATION NOTE  DATE: 08/09/2018 TIME: 4:14 PM  PATIENT:  Shannon [name]  52 y.o. female  PRE-OPERATIVE DIAGNOSIS:  unstable angina  POST-OPERATIVE DIAGNOSIS:  Non-obstructive CAD  PROCEDURE:  Procedure(s): LEFT HEART CATH AND CORONARY ANGIOGRAPHY (N/A)  SURGEON:  Surgeon(s) and Role:    * Lissa Rowles, MD - Primary  FINDINGS: 1. Mild to non-obstructive CAD. 2. Widely patent LCx stent. 3. Mildly elevated LVEDP with normal LVEF.  RECOMMENDATIONS: 1. Overnight observation. 2. Start furosemide 20 mg PO daily tomorrow for possible component of diastolic heart failure. 3. Aggressive secondary prevention and medical therapy.  Nelva Bush, MD [facility] Pager: [phone]

## 2018-08-09 NOTE — Progress Notes (Signed)
Pt received to room 3e9, pt aggravated that she cant move her right arm and the bp cuff on left arm, husband and mother at bedisde, offered to assist but then says her family will help, pt became frustrated with family not getting her sandwich done, NT assisted with calling for dinner, discussed limitations of right arm and she may get to chair or bathroom with assist the first time, need to oberve for bleeding and hematoma. Verbalized understanding

## 2018-08-09 NOTE — ED Triage Notes (Signed)
Pt sent by Heart care due to chest heaviness for 3 days. Reports some Sob. Hs hx of MI in 2014 with stents

## 2018-08-09 NOTE — ED Notes (Signed)
Went in to start patients nitroglycerin infusion. Patient states "He didn't tell me he wanted nitro. It gives me a headache and I don't want it. He said he was going to give me heparin. I'll take just the heparin. Do not give me that nitro." Medication returned to pyxis. Advised Dr Tomi Bamberger.

## 2018-08-09 NOTE — Addendum Note (Signed)
Addended by: Imogene Burn on: 08/09/2018 12:05 PM   Modules accepted: Orders

## 2018-08-09 NOTE — Interval H&P Note (Signed)
History and Physical Interval Note:  08/09/2018 3:31 PM  Shannon [name]  has presented today for cardiac catheterization, with the diagnosis of unstable angina.  The various methods of treatment have been discussed with the patient and family. After consideration of risks, benefits and other options for treatment, the patient has consented to  Procedure(s): LEFT HEART CATH AND CORONARY ANGIOGRAPHY (N/A) as a surgical intervention .  The patient's history has been reviewed, patient examined, no change in status, stable for surgery.  I have reviewed the patient's chart and labs.  Questions were answered to the patient's satisfaction.    Cath Lab Visit (complete for each Cath Lab visit)  Clinical Evaluation Leading to the Procedure:   ACS: Yes.    Non-ACS:  N/A  Garv Kuechle

## 2018-08-09 NOTE — Plan of Care (Signed)
  Problem: Health Behavior/Discharge Planning: Goal: Ability to manage health-related needs will improve Outcome: Progressing   Problem: Activity: Goal: Risk for activity intolerance will decrease Outcome: Progressing   Problem: Safety: Goal: Ability to remain free from injury will improve Outcome: Progressing   

## 2018-08-09 NOTE — H&P (View-Only) (Signed)
Cardiology Office Note    Date:  08/09/2018   ID:  Shannon [name] 1966-09-20, MRN [id number]  PCP:  Glenford Bayley, DO  Cardiologist: Kate Sable, MD EPS: None  Chief Complaint  Patient presents with  . Chest Pain    History of Present Illness:  Shannon [name] is a 52 y.o. female with history of CAD status post NSTEMI 2014 treated with DES to the circumflex, normal nuclear stress test 07/25/2015 LVEF 56%.  Hypertension, HLD, morbid obesity, tobacco abuse, anxiety depression.  Also has history of 40 to 59% R ICA with occlusion of the left ICA.  Vascular surgery felt the right side velocities were falsely elevated because of contralateral occlusion and stenosis is probably closer to 40%.  Angiography was recommended but patient declined.  Patient last saw Dr. Bronson Ing 03/08/2018 at which time she had bilateral leg edema after a beach trip.  2D echo showed normal LVEF 55 to 06%, normal diastolic function.  Patient comes in today with chest pressure for 1 week. Gets short of breath with little activity.Under a lot of stress for the past 2 weeks.Having a lot of heart burn and trouble swallowing solids-doesn't go all the way down. Food can't pass through. Hard for her to distinguish between heart, lungs and GI.  Has not used nitroglycerin.  Currently having chest pressure in the office.  EKG unchanged.  Smokes 1/2 to 2 packs of cigarettes daily.  Past Medical History:  Diagnosis Date  . Anemia   . Anxiety   . Carotid artery occlusion   . COPD (chronic obstructive pulmonary disease) ([place])   . Coronary artery disease    LHC 11/25/12 90% stenosis mid LCx & otherwise nonobstructive dz w/ EF 60-65% S/p PTCA/DES to LCx  . Depression   . Exertional dyspnea    chronic  . History of cardiovascular stress test 06/2015   low risk  . Hyperlipidemia   . Hypertension   . MI (myocardial infarction) ([place])   . Tobacco abuse     Past Surgical History:  Procedure Laterality Date  . CORONARY  ANGIOPLASTY WITH STENT PLACEMENT    . LEFT HEART CATHETERIZATION WITH CORONARY ANGIOGRAM N/A 11/15/2012   Procedure: LEFT HEART CATHETERIZATION WITH CORONARY ANGIOGRAM;  Surgeon: Thayer Headings, MD;  Location: [facility] CATH LAB;  Service: Cardiovascular;  Laterality: N/A;  . PERCUTANEOUS CORONARY STENT INTERVENTION (PCI-S)  11/15/2012   Procedure: PERCUTANEOUS CORONARY STENT INTERVENTION (PCI-S);  Surgeon: Thayer Headings, MD;  Location: [facility] CATH LAB;  Service: Cardiovascular;;  . skin cancer removed right lower leg Right   . TONSILLECTOMY    . uterus ablation      Current Medications: Current Meds  Medication Sig  . albuterol (PROVENTIL HFA;VENTOLIN HFA) 108 (90 Base) MCG/ACT inhaler Inhale 2 puffs into the lungs every 4 (four) hours as needed for wheezing or shortness of breath.  . ALPRAZolam (XANAX) 1 MG tablet Take 1 mg by mouth 2 (two) times daily as needed.  Marland Kitchen aspirin EC 81 MG tablet Take by mouth.  . bisoprolol-hydrochlorothiazide (ZIAC) 5-6.25 MG tablet Take 1 tablet by mouth daily.  Marland Kitchen escitalopram (LEXAPRO) 20 MG tablet Take 1 tablet (20 mg total) by mouth daily.  . fluticasone (FLONASE) 50 MCG/ACT nasal spray fluticasone propionate 50 mcg/actuation nasal spray,suspension  SPRAY 2 SPRAYS INTO EACH NOSTRIL EVERY DAY  . ibuprofen (ADVIL,MOTRIN) 200 MG tablet Take 200 mg by mouth every 6 (six) hours as needed.   Marland Kitchen ipratropium-albuterol (DUONEB) 0.5-2.5 (3) MG/3ML  SOLN Inhale 3 mLs into the lungs every 4 (four) hours as needed.  . nitroGLYCERIN (NITROSTAT) 0.4 MG SL tablet Place 1 tablet (0.4 mg total) under the tongue every 5 (five) minutes as needed for chest pain (up to 3 doses).  . rosuvastatin (CRESTOR) 10 MG tablet Take 1 tablet (10 mg total) by mouth at bedtime.     Allergies:   Morphine and related   Social History   Socioeconomic History  . Marital status: Legally Separated    Spouse name: Not on file  . Number of children: Not on file  . Years of education: Not on file  .  Highest education level: Not on file  Occupational History  . Not on file  Social Needs  . Financial resource strain: Not on file  . Food insecurity:    Worry: Not on file    Inability: Not on file  . Transportation needs:    Medical: Not on file    Non-medical: Not on file  Tobacco Use  . Smoking status: Current Every Day Smoker    Packs/day: 2.00    Years: 32.00    Pack years: 64.00    Types: Cigarettes    Start date: 03/29/1979  . Smokeless tobacco: Never Used  . Tobacco comment: Currently 2ppd  Substance and Sexual Activity  . Alcohol use: No    Alcohol/week: 0.0 standard drinks  . Drug use: No  . Sexual activity: Not Currently    Partners: Male  Lifestyle  . Physical activity:    Days per week: Not on file    Minutes per session: Not on file  . Stress: Not on file  Relationships  . Social connections:    Talks on phone: Not on file    Gets together: Not on file    Attends religious service: Not on file    Active member of club or organization: Not on file    Attends meetings of clubs or organizations: Not on file    Relationship status: Not on file  Other Topics Concern  . Not on file  Social History Narrative   Lives with husband in a 3 story home.  Has no children.     On disability.     Education: high school, some college.     Family History:  The patient's family history includes Anxiety disorder in her maternal grandmother and paternal grandmother; Cerebral aneurysm in her father; Depression in her mother and paternal grandmother; Heart attack in her father; Heart disease in her father; OCD in her paternal grandmother.   ROS:   Please see the history of present illness.    Review of Systems  Constitution: Negative.  HENT: Negative.   Eyes: Negative.   Cardiovascular: Positive for chest pain and dyspnea on exertion.  Respiratory: Negative.   Hematologic/Lymphatic: Negative.   Musculoskeletal: Negative.  Negative for joint pain.  Gastrointestinal:  Positive for dysphagia and heartburn.  Genitourinary: Negative.   Neurological: Negative.   Psychiatric/Behavioral: The patient is nervous/anxious.    All other systems reviewed and are negative.   PHYSICAL EXAM:   VS:  BP 128/82   Pulse 81   Ht 5\' 5"  (1.651 m)   Wt 282 lb (127.9 kg)   SpO2 98%   BMI 46.93 kg/m   Physical Exam  GEN: Well nourished, well developed, in no acute distress  HEENT: normal  Neck: Bilateral carotid bruits no JVD, or masses Cardiac:RRR; no murmurs, rubs, or gallops  Respiratory:  clear to  auscultation bilaterally, normal work of breathing GI: soft, nontender, nondistended, + BS Ext: without cyanosis, clubbing, or edema, Good distal pulses bilaterally MS: no deformity or atrophy  Skin: warm and dry, no rash Neuro:  Alert and Oriented x 3, Strength and sensation are intact Psych: euthymic mood, full affect  Wt Readings from Last 3 Encounters:  08/09/18 282 lb (127.9 kg)  03/08/18 272 lb 4.8 oz (123.5 kg)  01/13/18 265 lb 14.4 oz (120.6 kg)      Studies/Labs Reviewed:   EKG:  EKG is  ordered today.  The ekg ordered today demonstrates normal sinus rhythm, no acute change  Recent Labs: 09/22/2017: BUN 10; Creatinine, Ser 0.90; Hemoglobin 15.3; Potassium 3.7; Sodium 136   Lipid Panel    Component Value Date/Time   CHOL 294 (H) 09/04/2009 1308   TRIG 244.0 (H) 09/04/2009 1308   HDL 39.00 (L) 09/04/2009 1308   CHOLHDL 8 09/04/2009 1308   VLDL 48.8 (H) 09/04/2009 1308   LDLDIRECT 200.3 09/04/2009 1308    Additional studies/ records that were reviewed today include:   2D echo 6/2019Study Conclusions   - Procedure narrative: Transthoracic echocardiography. Image   quality was suboptimal. Contrast enhancement was employed. - Left ventricle: The cavity size was normal. Wall thickness was   increased in a pattern of mild LVH. Systolic function was normal.   The estimated ejection fraction was in the range of 55% to 60%.   Wall motion was  normal; there were no regional wall motion   abnormalities. Left ventricular diastolic function parameters   were normal.   Carotid Dopplers 4/17/2019Final Interpretation: Right Carotid: Velocities in the right ICA are consistent with a 40-59%                stenosis. This may be compensatory flow.  Left Carotid: Limited visualization. Flow noted in the ICA. Unable to obtain               velocities in the 80-99% stenosis range as noted on previous               study, however, this may be due to sub-optimal visualization.  Vertebrals:  Bilateral vertebral arteries demonstrate antegrade flow. Subclavians: Normal flow hemodynamics were seen in bilateral subclavian              arteries.  *See table(s) above for measurements and observations.     Electronically signed by Adele Barthel on 01/13/2018 at 1:51:05 PM.    Nuclear stress test 2016  There was no ST segment deviation noted during stress.  The study is normal.  This is a low risk study.  Nuclear stress EF: 56%.     Cardiac catheterization 2/2014Angiography    Left Main: minor luminal irregularities   Left anterior Descending: minor luminal irregularities,  10-20% stenosis.  1st diagonal - moderate sized, minor luminal irregularities   Left Circumflex: moderate in size, there is a tight 90% stenosis in the mid LCx. The lesion is eccentric.  The posterior lateral branch and the terminal LCx have mild diffuse disease   Right Coronary Artery: small, mild diffuse disease   LV Gram: overall well preserved LV function.  EF 02-40%   Complications: No apparent complications Patient did tolerate procedure well.   Contrast used: 60 cc   Conclusions:   Single vessel CAD involving the LCx.   Have discussed with Dr. Burt Knack. He will proceed with stenting of the LCx this afternoon.     Ramond Dial.,  MD, [facility] 11/15/2012, 4:38 PM Office - [phone] Pager [phone]     Lesion Data: Vessel: LCx Percent stenosis  (pre): 95 TIMI-flow (pre):  3 Stent:  3.0x32 mm drug-eluting Percent stenosis (post): 0 TIMI-flow (post): 3   Conclusions: Successful PCI of the LCx with a single long DES   Recommendations: DAPT with ASA and effient for 12 months.   Sherren Mocha 11/15/2012, 5:20 PM     ASSESSMENT:    1. Chest pain, unspecified type   2. Coronary artery disease involving native coronary artery of native heart with other form of angina pectoris ([place])   3. Essential hypertension   4. Mixed hyperlipidemia   5. Tobacco use disorder   6. Bilateral carotid artery stenosis   7. Gastroesophageal reflux disease, esophagitis presence not specified      PLAN:  In order of problems listed above:  Unstable angina patient with one-week history of chest pain worse with exertion but having active chest pain now.  EKG without acute change.  We will send her to the emergency room to be started on IV heparin, check preliminary labs and transfer to [facility] for cardiac catheterization.  Dr. Bronson Ing saw the patient and concurs.  CAD status post NSTEMI in 2014 treated with DES to the circumflex, normal NST 2016  Essential hypertension pressure stable  Hyperlipidemia on Crestor  Tobacco abuse continues to smoke 1/2 to 2 packs of cigarettes a day.  Smoking cessation discussed with patient.  Bilateral carotid artery stenosis followed by [facility]  GERD with symptoms of dysphasia.  Will need to see GI after cardiac work-up done.    Medication Adjustments/Labs and Tests Ordered: Current medicines are reviewed at length with the patient today.  Concerns regarding medicines are outlined above.  Medication changes, Labs and Tests ordered today are listed in the Patient Instructions below. There are no Patient Instructions on file for this visit.   Sumner Boast, PA-C  08/09/2018 11:42 AM    [facility] [address], [place], [place]  [address] Phone: [phone]; Fax: [phone]

## 2018-08-09 NOTE — ED Provider Notes (Signed)
[facility] EMERGENCY DEPARTMENT Provider Note   CSN: [id number] Arrival date & time: 08/09/18  1139     History   Chief Complaint Chief Complaint  Patient presents with  . Chest Pain    HPI Shannon [name] is a 52 y.o. female.  HPI She has a history of coronary artery disease status post and STEMI with subsequent cardiac stenting in 2014.  Patient continues to smoke cigarettes.  Patient presents to the emergency room after being sent to the ED awaiting admission from the cardiologist office.  Patient states symptoms started about a week ago.  Patient has been getting some shortness of breath with activity and has been having a pressure discomfort in the center of her chest.  She does have some complaints of heartburn as well.  Patient continues to have chest pressure today. Past Medical History:  Diagnosis Date  . Anemia   . Anxiety   . Carotid artery occlusion   . COPD (chronic obstructive pulmonary disease) ([place])   . Coronary artery disease    LHC 11/25/12 90% stenosis mid LCx & otherwise nonobstructive dz w/ EF 60-65% S/p PTCA/DES to LCx  . Depression   . Exertional dyspnea    chronic  . History of cardiovascular stress test 06/2015   low risk  . Hyperlipidemia   . Hypertension   . MI (myocardial infarction) ([place])   . Tobacco abuse     Patient Active Problem List   Diagnosis Date Noted  . GERD (gastroesophageal reflux disease) 08/09/2018  . Carotid artery disease ([place]) 07/24/2017  . AAA (abdominal aortic aneurysm) without rupture ([place]) 01/05/2013  . Other malaise and fatigue 01/05/2013  . Palpitations 01/05/2013  . Tobacco use disorder 12/02/2012  . Coronary artery disease   . Hyperlipidemia   . Hypertension   . SHORTNESS OF BREATH 09/04/2009  . CHEST PAIN 09/04/2009    Past Surgical History:  Procedure Laterality Date  . CORONARY ANGIOPLASTY WITH STENT PLACEMENT    . LEFT HEART CATHETERIZATION WITH CORONARY ANGIOGRAM N/A 11/15/2012   Procedure: LEFT HEART  CATHETERIZATION WITH CORONARY ANGIOGRAM;  Surgeon: Thayer Headings, MD;  Location: [facility] CATH LAB;  Service: Cardiovascular;  Laterality: N/A;  . PERCUTANEOUS CORONARY STENT INTERVENTION (PCI-S)  11/15/2012   Procedure: PERCUTANEOUS CORONARY STENT INTERVENTION (PCI-S);  Surgeon: Thayer Headings, MD;  Location: [facility] CATH LAB;  Service: Cardiovascular;;  . skin cancer removed right lower leg Right   . TONSILLECTOMY    . uterus ablation       OB History   None      Home Medications    Prior to Admission medications   Medication Sig Start Date End Date Taking? Authorizing Provider  albuterol (PROVENTIL HFA;VENTOLIN HFA) 108 (90 Base) MCG/ACT inhaler Inhale 2 puffs into the lungs every 4 (four) hours as needed for wheezing or shortness of breath. 12/21/15   Ward, Delice Bison, DO  ALPRAZolam Duanne Moron) 1 MG tablet Take 1 mg by mouth 2 (two) times daily as needed. 06/12/17   [provider]  aspirin EC 81 MG tablet Take by mouth.    [provider]  bisoprolol-hydrochlorothiazide (ZIAC) 5-6.25 MG tablet Take 1 tablet by mouth daily. 01/14/18   [provider]  escitalopram (LEXAPRO) 20 MG tablet Take 1 tablet (20 mg total) by mouth daily. 02/02/14 08/09/18  Leonides Grills, MD  fluticasone (FLONASE) 50 MCG/ACT nasal spray fluticasone propionate 50 mcg/actuation nasal spray,suspension  SPRAY 2 SPRAYS INTO EACH NOSTRIL EVERY DAY  [provider]  ibuprofen (ADVIL,MOTRIN) 200 MG tablet Take 200 mg by mouth every 6 (six) hours as needed.     [provider]  ipratropium-albuterol (DUONEB) 0.5-2.5 (3) MG/3ML SOLN Inhale 3 mLs into the lungs every 4 (four) hours as needed. 03/03/18   [provider]  nitroGLYCERIN (NITROSTAT) 0.4 MG SL tablet Place 1 tablet (0.4 mg total) under the tongue every 5 (five) minutes as needed for chest pain (up to 3 doses). 11/16/12   Hope, Jessica A, PA-C  rosuvastatin (CRESTOR) 10 MG tablet Take 1 tablet (10 mg total) by mouth  at bedtime. 02/27/17   Herminio Commons, MD    Family History Family History  Problem Relation Age of Onset  . Depression Mother   . Anxiety disorder Maternal Grandmother   . Anxiety disorder Paternal Grandmother   . OCD Paternal Grandmother   . Depression Paternal Grandmother   . Heart attack Father        Deceased, 23  . Cerebral aneurysm Father   . Heart disease Father     Social History Social History   Tobacco Use  . Smoking status: Current Every Day Smoker    Packs/day: 2.00    Years: 32.00    Pack years: 64.00    Types: Cigarettes    Start date: 03/29/1979  . Smokeless tobacco: Never Used  . Tobacco comment: Currently 2ppd  Substance Use Topics  . Alcohol use: No    Alcohol/week: 0.0 standard drinks  . Drug use: No     Allergies   Morphine and related   Review of Systems Review of Systems  All other systems reviewed and are negative.    Physical Exam Updated Vital Signs BP (!) 139/97 (BP Location: Left Arm)   Pulse 72   Temp 98.1 F (36.7 C) (Oral)   Wt 127.9 kg   SpO2 99%   BMI 46.92 kg/m   Physical Exam  Constitutional: No distress.  Obese  HENT:  Head: Normocephalic and atraumatic.  Right Ear: External ear normal.  Left Ear: External ear normal.  Eyes: Conjunctivae are normal. Right eye exhibits no discharge. Left eye exhibits no discharge. No scleral icterus.  Neck: Neck supple. No tracheal deviation present.  Cardiovascular: Normal rate, regular rhythm and intact distal pulses.  Pulmonary/Chest: Effort normal and breath sounds normal. No stridor. No respiratory distress. She has no wheezes. She has no rales.  Abdominal: Soft. Bowel sounds are normal. She exhibits no distension. There is no tenderness. There is no rebound and no guarding.  Musculoskeletal: She exhibits no edema or tenderness.       Right lower leg: She exhibits no edema.       Left lower leg: She exhibits no edema.  Neurological: She is alert. She has normal  strength. No cranial nerve deficit (no facial droop, extraocular movements intact, no slurred speech) or sensory deficit. She exhibits normal muscle tone. She displays no seizure activity. Coordination normal.  Skin: Skin is warm and dry. No rash noted.  Psychiatric: She has a normal mood and affect.  Nursing note and vitals reviewed.    ED Treatments / Results  Labs (all labs ordered are listed, but only abnormal results are displayed) Labs Reviewed  BASIC METABOLIC PANEL - Abnormal; Notable for the following components:      Result Value   Glucose, Bld 115 (*)    All other components within normal limits  CBC - Abnormal; Notable for the following components:   RBC  5.25 (*)    HCT 46.3 (*)    All other components within normal limits  TROPONIN I  TROPONIN I  PREGNANCY, URINE  HEPARIN LEVEL (UNFRACTIONATED)    EKG EKG Interpretation  Date/Time:  Monday August 09 2018 11:50:10 EST Ventricular Rate:  70 PR Interval:    QRS Duration: 91 QT Interval:  394 QTC Calculation: 426 R Axis:   80 Text Interpretation:  Sinus rhythm Low voltage, extremity and precordial leads No significant change since last tracing Confirmed by Dorie Rank [phone]) on 08/09/2018 11:57:24 AM   Radiology Dg Chest Portable 1 View  Result Date: 08/09/2018 CLINICAL DATA:  Chest pain.  Smoker. EXAM: PORTABLE CHEST 1 VIEW COMPARISON:  03/03/2018 FINDINGS: The heart size and mediastinal contours are within normal limits. Both lungs are clear. The visualized skeletal structures are unremarkable. IMPRESSION: No active disease. Electronically Signed   By: Kerby Moors M.D.   On: 08/09/2018 12:24    Procedures .Critical Care Performed by: Dorie Rank, MD Authorized by: Dorie Rank, MD   Critical care provider statement:    Critical care time (minutes):  30   Critical care was time spent personally by me on the following activities:  Discussions with consultants, evaluation of patient's response to  treatment, examination of patient, ordering and performing treatments and interventions, ordering and review of laboratory studies, ordering and review of radiographic studies, pulse oximetry, re-evaluation of patient's condition, obtaining history from patient or surrogate and review of old charts   (including critical care time)  Medications Ordered in ED Medications  nitroGLYCERIN 50 mg in dextrose 5 % 250 mL (0.2 mg/mL) infusion (has no administration in time range)  heparin bolus via infusion 4,000 Units (has no administration in time range)    Followed by  heparin ADULT infusion 100 units/mL (25000 units/217mL sodium chloride 0.45%) (has no administration in time range)  aspirin chewable tablet 324 mg (324 mg Oral Given 08/09/18 1217)     Initial Impression / Assessment and Plan / ED Course  I have reviewed the triage vital signs and the nursing notes.  Pertinent labs & imaging results that were available during my care of the patient were reviewed by me and considered in my medical decision making (see chart for details).  Clinical Course as of Aug 10 1239  Mon Aug 09, 2018  1238 Initial trop is normal.     [JK]  1239 CXR negative.  Arrangements have been made by cardiology team to have the patient transfer to [place] for cardiac catheterization.  Heparin ordered.  Pt has refused the NTG   [JK]    Clinical Course User Index [JK] Dorie Rank, MD    Patient has known history of coronary artery disease.  She unfortunately has had chest heaviness and some shortness of breath for the last few days.  She went to her cardiologist appointment this morning and they felt she needed to be transferred to [facility] to have a cardiac catheterization.  Patient was sent to the ED for stabilization.  Patient was started on a heparin infusion.  Nitroglycerin was ordered but she refused.  Patient has remained hemodynamically stable.  No acute EKG changes and initial troponin is normal.   Plan on transfer to [facility].  Final Clinical Impressions(s) / ED Diagnoses   Final diagnoses:  Unstable angina [facility])      Dorie Rank, MD 08/09/18 1241

## 2018-08-09 NOTE — ED Notes (Signed)
[facility] arrived to transport patient to Cath Lab at [facility].

## 2018-08-09 NOTE — Progress Notes (Signed)
ANTICOAGULATION CONSULT NOTE - Initial Consult  Pharmacy Consult for heparin  Indication: chest pain/ACS  Allergies  Allergen Reactions  . Morphine And Related Other (See Comments)    PATIENT REFUSES THIS MEDICATION: states that she does not tolerate this medication well    Patient Measurements: Weight: 281 lb 15.5 oz (127.9 kg) Heparin Dosing Weight: 86kg   Vital Signs: Temp: 98.1 F (36.7 C) (11/11 1149) Temp Source: Oral (11/11 1149) BP: 139/97 (11/11 1149) Pulse Rate: 72 (11/11 1149)  Labs: Recent Labs    08/09/18 1151  HGB 14.5  HCT 46.3*  PLT 154  CREATININE 0.91    Estimated Creatinine Clearance: 95.3 mL/min (by C-G formula based on SCr of 0.91 mg/dL).   Medical History: Past Medical History:  Diagnosis Date  . Anemia   . Anxiety   . Carotid artery occlusion   . COPD (chronic obstructive pulmonary disease) ([place])   . Coronary artery disease    LHC 11/25/12 90% stenosis mid LCx & otherwise nonobstructive dz w/ EF 60-65% S/p PTCA/DES to LCx  . Depression   . Exertional dyspnea    chronic  . History of cardiovascular stress test 06/2015   low risk  . Hyperlipidemia   . Hypertension   . MI (myocardial infarction) ([place])   . Tobacco abuse     Medications:  See med rec  Assessment: 52 yo female presented with chest pain. Has significant cardiac history. Plan is to send to [facility] for cardiac cath. Asked to start Heparin infusion.  Goal of Therapy:  INR 2-3 Monitor platelets by anticoagulation protocol: Yes   Plan:  Give 4000 units bolus x 1 Start heparin infusion at 1050 units/hr Check anti-Xa level in 6 hours and daily while on heparin Continue to monitor H&H and platelets  Isac Sarna, BS Vena Austria, BCPS Clinical Pharmacist Pager [phone] 08/09/2018,12:30 PM

## 2018-08-09 NOTE — Progress Notes (Signed)
Cardiology Office Note    Date:  08/09/2018   ID:  Shannon [name], Shannon [name] Feb 11, 1966, MRN [id number]  PCP:  Glenford Bayley, DO  Cardiologist: Kate Sable, MD EPS: None  Chief Complaint  Patient presents with  . Chest Pain    History of Present Illness:  Shannon [name] is a 52 y.o. female with history of CAD status post NSTEMI 2014 treated with DES to the circumflex, normal nuclear stress test 07/25/2015 LVEF 56%.  Hypertension, HLD, morbid obesity, tobacco abuse, anxiety depression.  Also has history of 40 to 59% R ICA with occlusion of the left ICA.  Vascular surgery felt the right side velocities were falsely elevated because of contralateral occlusion and stenosis is probably closer to 40%.  Angiography was recommended but patient declined.  Patient last saw Dr. Bronson Ing 03/08/2018 at which time she had bilateral leg edema after a beach trip.  2D echo showed normal LVEF 55 to 17%, normal diastolic function.  Patient comes in today with chest pressure for 1 week. Gets short of breath with little activity.Under a lot of stress for the past 2 weeks.Having a lot of heart burn and trouble swallowing solids-doesn't go all the way down. Food can't pass through. Hard for her to distinguish between heart, lungs and GI.  Has not used nitroglycerin.  Currently having chest pressure in the office.  EKG unchanged.  Smokes 1/2 to 2 packs of cigarettes daily.  Past Medical History:  Diagnosis Date  . Anemia   . Anxiety   . Carotid artery occlusion   . COPD (chronic obstructive pulmonary disease) ([place])   . Coronary artery disease    LHC 11/25/12 90% stenosis mid LCx & otherwise nonobstructive dz w/ EF 60-65% S/p PTCA/DES to LCx  . Depression   . Exertional dyspnea    chronic  . History of cardiovascular stress test 06/2015   low risk  . Hyperlipidemia   . Hypertension   . MI (myocardial infarction) ([place])   . Tobacco abuse     Past Surgical History:  Procedure Laterality Date  . CORONARY  ANGIOPLASTY WITH STENT PLACEMENT    . LEFT HEART CATHETERIZATION WITH CORONARY ANGIOGRAM N/A 11/15/2012   Procedure: LEFT HEART CATHETERIZATION WITH CORONARY ANGIOGRAM;  Surgeon: Thayer Headings, MD;  Location: [facility] CATH LAB;  Service: Cardiovascular;  Laterality: N/A;  . PERCUTANEOUS CORONARY STENT INTERVENTION (PCI-S)  11/15/2012   Procedure: PERCUTANEOUS CORONARY STENT INTERVENTION (PCI-S);  Surgeon: Thayer Headings, MD;  Location: [facility] CATH LAB;  Service: Cardiovascular;;  . skin cancer removed right lower leg Right   . TONSILLECTOMY    . uterus ablation      Current Medications: Current Meds  Medication Sig  . albuterol (PROVENTIL HFA;VENTOLIN HFA) 108 (90 Base) MCG/ACT inhaler Inhale 2 puffs into the lungs every 4 (four) hours as needed for wheezing or shortness of breath.  . ALPRAZolam (XANAX) 1 MG tablet Take 1 mg by mouth 2 (two) times daily as needed.  Marland Kitchen aspirin EC 81 MG tablet Take by mouth.  . bisoprolol-hydrochlorothiazide (ZIAC) 5-6.25 MG tablet Take 1 tablet by mouth daily.  Marland Kitchen escitalopram (LEXAPRO) 20 MG tablet Take 1 tablet (20 mg total) by mouth daily.  . fluticasone (FLONASE) 50 MCG/ACT nasal spray fluticasone propionate 50 mcg/actuation nasal spray,suspension  SPRAY 2 SPRAYS INTO EACH NOSTRIL EVERY DAY  . ibuprofen (ADVIL,MOTRIN) 200 MG tablet Take 200 mg by mouth every 6 (six) hours as needed.   Marland Kitchen ipratropium-albuterol (DUONEB) 0.5-2.5 (3) MG/3ML  SOLN Inhale 3 mLs into the lungs every 4 (four) hours as needed.  . nitroGLYCERIN (NITROSTAT) 0.4 MG SL tablet Place 1 tablet (0.4 mg total) under the tongue every 5 (five) minutes as needed for chest pain (up to 3 doses).  . rosuvastatin (CRESTOR) 10 MG tablet Take 1 tablet (10 mg total) by mouth at bedtime.     Allergies:   Morphine and related   Social History   Socioeconomic History  . Marital status: Legally Separated    Spouse name: Not on file  . Number of children: Not on file  . Years of education: Not on file  .  Highest education level: Not on file  Occupational History  . Not on file  Social Needs  . Financial resource strain: Not on file  . Food insecurity:    Worry: Not on file    Inability: Not on file  . Transportation needs:    Medical: Not on file    Non-medical: Not on file  Tobacco Use  . Smoking status: Current Every Day Smoker    Packs/day: 2.00    Years: 32.00    Pack years: 64.00    Types: Cigarettes    Start date: 03/29/1979  . Smokeless tobacco: Never Used  . Tobacco comment: Currently 2ppd  Substance and Sexual Activity  . Alcohol use: No    Alcohol/week: 0.0 standard drinks  . Drug use: No  . Sexual activity: Not Currently    Partners: Male  Lifestyle  . Physical activity:    Days per week: Not on file    Minutes per session: Not on file  . Stress: Not on file  Relationships  . Social connections:    Talks on phone: Not on file    Gets together: Not on file    Attends religious service: Not on file    Active member of club or organization: Not on file    Attends meetings of clubs or organizations: Not on file    Relationship status: Not on file  Other Topics Concern  . Not on file  Social History Narrative   Lives with husband in a 3 story home.  Has no children.     On disability.     Education: high school, some college.     Family History:  The patient's family history includes Anxiety disorder in her maternal grandmother and paternal grandmother; Cerebral aneurysm in her father; Depression in her mother and paternal grandmother; Heart attack in her father; Heart disease in her father; OCD in her paternal grandmother.   ROS:   Please see the history of present illness.    Review of Systems  Constitution: Negative.  HENT: Negative.   Eyes: Negative.   Cardiovascular: Positive for chest pain and dyspnea on exertion.  Respiratory: Negative.   Hematologic/Lymphatic: Negative.   Musculoskeletal: Negative.  Negative for joint pain.  Gastrointestinal:  Positive for dysphagia and heartburn.  Genitourinary: Negative.   Neurological: Negative.   Psychiatric/Behavioral: The patient is nervous/anxious.    All other systems reviewed and are negative.   PHYSICAL EXAM:   VS:  BP 128/82   Pulse 81   Ht 5\' 5"  (1.651 m)   Wt 282 lb (127.9 kg)   SpO2 98%   BMI 46.93 kg/m   Physical Exam  GEN: Well nourished, well developed, in no acute distress  HEENT: normal  Neck: Bilateral carotid bruits no JVD, or masses Cardiac:RRR; no murmurs, rubs, or gallops  Respiratory:  clear to  auscultation bilaterally, normal work of breathing GI: soft, nontender, nondistended, + BS Ext: without cyanosis, clubbing, or edema, Good distal pulses bilaterally MS: no deformity or atrophy  Skin: warm and dry, no rash Neuro:  Alert and Oriented x 3, Strength and sensation are intact Psych: euthymic mood, full affect  Wt Readings from Last 3 Encounters:  08/09/18 282 lb (127.9 kg)  03/08/18 272 lb 4.8 oz (123.5 kg)  01/13/18 265 lb 14.4 oz (120.6 kg)      Studies/Labs Reviewed:   EKG:  EKG is  ordered today.  The ekg ordered today demonstrates normal sinus rhythm, no acute change  Recent Labs: 09/22/2017: BUN 10; Creatinine, Ser 0.90; Hemoglobin 15.3; Potassium 3.7; Sodium 136   Lipid Panel    Component Value Date/Time   CHOL 294 (H) 09/04/2009 1308   TRIG 244.0 (H) 09/04/2009 1308   HDL 39.00 (L) 09/04/2009 1308   CHOLHDL 8 09/04/2009 1308   VLDL 48.8 (H) 09/04/2009 1308   LDLDIRECT 200.3 09/04/2009 1308    Additional studies/ records that were reviewed today include:   2D echo 6/2019Study Conclusions   - Procedure narrative: Transthoracic echocardiography. Image   quality was suboptimal. Contrast enhancement was employed. - Left ventricle: The cavity size was normal. Wall thickness was   increased in a pattern of mild LVH. Systolic function was normal.   The estimated ejection fraction was in the range of 55% to 60%.   Wall motion was  normal; there were no regional wall motion   abnormalities. Left ventricular diastolic function parameters   were normal.   Carotid Dopplers 4/17/2019Final Interpretation: Right Carotid: Velocities in the right ICA are consistent with a 40-59%                stenosis. This may be compensatory flow.  Left Carotid: Limited visualization. Flow noted in the ICA. Unable to obtain               velocities in the 80-99% stenosis range as noted on previous               study, however, this may be due to sub-optimal visualization.  Vertebrals:  Bilateral vertebral arteries demonstrate antegrade flow. Subclavians: Normal flow hemodynamics were seen in bilateral subclavian              arteries.  *See table(s) above for measurements and observations.     Electronically signed by Adele Barthel on 01/13/2018 at 1:51:05 PM.    Nuclear stress test 2016  There was no ST segment deviation noted during stress.  The study is normal.  This is a low risk study.  Nuclear stress EF: 56%.     Cardiac catheterization 2/2014Angiography    Left Main: minor luminal irregularities   Left anterior Descending: minor luminal irregularities,  10-20% stenosis.  1st diagonal - moderate sized, minor luminal irregularities   Left Circumflex: moderate in size, there is a tight 90% stenosis in the mid LCx. The lesion is eccentric.  The posterior lateral branch and the terminal LCx have mild diffuse disease   Right Coronary Artery: small, mild diffuse disease   LV Gram: overall well preserved LV function.  EF 53-66%   Complications: No apparent complications Patient did tolerate procedure well.   Contrast used: 60 cc   Conclusions:   Single vessel CAD involving the LCx.   Have discussed with Dr. Burt Knack. He will proceed with stenting of the LCx this afternoon.     Ramond Dial.,  MD, [facility] 11/15/2012, 4:38 PM Office - [phone] Pager [phone]     Lesion Data: Vessel: LCx Percent stenosis  (pre): 95 TIMI-flow (pre):  3 Stent:  3.0x32 mm drug-eluting Percent stenosis (post): 0 TIMI-flow (post): 3   Conclusions: Successful PCI of the LCx with a single long DES   Recommendations: DAPT with ASA and effient for 12 months.   Sherren Mocha 11/15/2012, 5:20 PM     ASSESSMENT:    1. Chest pain, unspecified type   2. Coronary artery disease involving native coronary artery of native heart with other form of angina pectoris ([place])   3. Essential hypertension   4. Mixed hyperlipidemia   5. Tobacco use disorder   6. Bilateral carotid artery stenosis   7. Gastroesophageal reflux disease, esophagitis presence not specified      PLAN:  In order of problems listed above:  Unstable angina patient with one-week history of chest pain worse with exertion but having active chest pain now.  EKG without acute change.  We will send her to the emergency room to be started on IV heparin, check preliminary labs and transfer to [facility] for cardiac catheterization.  Dr. Bronson Ing saw the patient and concurs.  CAD status post NSTEMI in 2014 treated with DES to the circumflex, normal NST 2016  Essential hypertension pressure stable  Hyperlipidemia on Crestor  Tobacco abuse continues to smoke 1/2 to 2 packs of cigarettes a day.  Smoking cessation discussed with patient.  Bilateral carotid artery stenosis followed by [facility]  GERD with symptoms of dysphasia.  Will need to see GI after cardiac work-up done.    Medication Adjustments/Labs and Tests Ordered: Current medicines are reviewed at length with the patient today.  Concerns regarding medicines are outlined above.  Medication changes, Labs and Tests ordered today are listed in the Patient Instructions below. There are no Patient Instructions on file for this visit.   Sumner Boast, PA-C  08/09/2018 11:42 AM    [facility] [address], [place], [place]  [address] Phone: [phone]; Fax: [phone]

## 2018-08-10 ENCOUNTER — Encounter (HOSPITAL_COMMUNITY): Payer: Self-pay | Admitting: Internal Medicine

## 2018-08-10 DIAGNOSIS — I200 Unstable angina: Secondary | ICD-10-CM

## 2018-08-10 DIAGNOSIS — I5032 Chronic diastolic (congestive) heart failure: Secondary | ICD-10-CM

## 2018-08-10 DIAGNOSIS — I10 Essential (primary) hypertension: Secondary | ICD-10-CM

## 2018-08-10 DIAGNOSIS — E7800 Pure hypercholesterolemia, unspecified: Secondary | ICD-10-CM

## 2018-08-10 DIAGNOSIS — I2510 Atherosclerotic heart disease of native coronary artery without angina pectoris: Secondary | ICD-10-CM | POA: Diagnosis not present

## 2018-08-10 DIAGNOSIS — I2583 Coronary atherosclerosis due to lipid rich plaque: Secondary | ICD-10-CM

## 2018-08-10 HISTORY — DX: Chronic diastolic (congestive) heart failure: I50.32

## 2018-08-10 LAB — BASIC METABOLIC PANEL
ANION GAP: 7 (ref 5–15)
BUN: 8 mg/dL (ref 6–20)
CO2: 23 mmol/L (ref 22–32)
Calcium: 8.5 mg/dL — ABNORMAL LOW (ref 8.9–10.3)
Chloride: 105 mmol/L (ref 98–111)
Creatinine, Ser: 1.02 mg/dL — ABNORMAL HIGH (ref 0.44–1.00)
Glucose, Bld: 114 mg/dL — ABNORMAL HIGH (ref 70–99)
Potassium: 3.9 mmol/L (ref 3.5–5.1)
SODIUM: 135 mmol/L (ref 135–145)

## 2018-08-10 LAB — LIPID PANEL
Cholesterol: 194 mg/dL (ref 0–200)
HDL: 36 mg/dL — ABNORMAL LOW (ref >40)
LDL CALC: 105 mg/dL — AB (ref 0–99)
Total CHOL/HDL Ratio: 5.4 RATIO
Triglycerides: 264 mg/dL — ABNORMAL HIGH (ref <150)
VLDL: 53 mg/dL — ABNORMAL HIGH (ref 0–40)

## 2018-08-10 LAB — CBC
HCT: 44 % (ref 36.0–46.0)
HEMOGLOBIN: 13.3 g/dL (ref 12.0–15.0)
MCH: 26.9 pg (ref 26.0–34.0)
MCHC: 30.2 g/dL (ref 30.0–36.0)
MCV: 88.9 fL (ref 80.0–100.0)
NRBC: 0 % (ref 0.0–0.2)
Platelets: 166 10*3/uL (ref 150–400)
RBC: 4.95 MIL/uL (ref 3.87–5.11)
RDW: 14.6 % (ref 11.5–15.5)
WBC: 6.5 10*3/uL (ref 4.0–10.5)

## 2018-08-10 MED ORDER — ROSUVASTATIN CALCIUM 20 MG PO TABS: 20.0000 mg | ORAL_TABLET | Freq: Every day | ORAL | 6 refills | Status: DC

## 2018-08-10 MED ORDER — FUROSEMIDE 20 MG PO TABS: 20.0000 mg | ORAL_TABLET | Freq: Every day | ORAL | 6 refills | Status: DC

## 2018-08-10 MED ORDER — BISOPROLOL FUMARATE 5 MG PO TABS: 5.0000 mg | ORAL_TABLET | Freq: Every day | ORAL | 6 refills | Status: DC

## 2018-08-10 MED ORDER — BISOPROLOL FUMARATE 5 MG PO TABS
5.0000 mg | ORAL_TABLET | Freq: Every day | ORAL | Status: DC
  Filled 2018-08-10: qty 1

## 2018-08-10 MED ORDER — ROSUVASTATIN CALCIUM 20 MG PO TABS: 20.0000 mg | ORAL_TABLET | Freq: Every day | ORAL | Status: DC

## 2018-08-10 NOTE — Progress Notes (Addendum)
Progress Note  Patient Name: Shannon [name] Date of Encounter: 08/10/2018  Primary Cardiologist: Kate Sable, MD   Subjective   Doing well this morning with no complaints.  Cardiac cath yesterday showed mild nonobstructive CAD with widely patent left circumflex stent and 40% mid LAD and mildly elevated LVEDP.  She denies any chest pain or shortness of breath this morning.  Inpatient Medications    Scheduled Meds: . aspirin EC  81 mg Oral Daily  . bisoprolol  5 mg Oral Daily  . enoxaparin (LOVENOX) injection  40 mg Subcutaneous Q24H  . escitalopram  20 mg Oral Daily  . furosemide  20 mg Oral Daily  . Influenza vac split quadrivalent PF  0.5 mL Intramuscular Tomorrow-1000  . rosuvastatin  10 mg Oral QHS  . sodium chloride flush  3 mL Intravenous Q12H   Continuous Infusions: . sodium chloride     PRN Meds: sodium chloride, acetaminophen, ALPRAZolam, ipratropium-albuterol, nitroGLYCERIN, ondansetron (ZOFRAN) IV, sodium chloride flush   Vital Signs    Vitals:   08/09/18 1800 08/09/18 1817 08/09/18 1934 08/10/18 0523  BP: (!) 112/101 108/68 114/67 125/70  Pulse: 64 69 77 70  Resp:   18 18  Temp:   98 F (36.7 C) 98.1 F (36.7 C)  TempSrc:   Oral Oral  SpO2:   99% 99%  Weight:    127.8 kg    Intake/Output Summary (Last 24 hours) at 08/10/2018 0859 Last data filed at 08/09/2018 2249 Gross per 24 hour  Intake 1515.1 ml  Output 0 ml  Net 1515.1 ml   Filed Weights   08/09/18 1147 08/10/18 0523  Weight: 127.9 kg 127.8 kg    Telemetry    NSR - Personally Reviewed  ECG    No new EKG to review - Personally Reviewed  Physical Exam   GEN: No acute distress.   Neck: No JVD Cardiac: RRR, no murmurs, rubs, or gallops.  Respiratory: Clear to auscultation bilaterally. GI: Soft, nontender, non-distended  MS: No edema; No deformity. Neuro:  Nonfocal  Psych: Normal affect   Labs    Chemistry Recent Labs  Lab 08/09/18 1151 08/10/18 0539  NA 137 135  K  4.3 3.9  CL 103 105  CO2 27 23  GLUCOSE 115* 114*  BUN 11 8  CREATININE 0.91 1.02*  CALCIUM 9.1 8.5*  GFRNONAA >60 >60  GFRAA >60 >60  ANIONGAP 7 7     Hematology Recent Labs  Lab 08/09/18 1151 08/10/18 0539  WBC 8.7 6.5  RBC 5.25* 4.95  HGB 14.5 13.3  HCT 46.3* 44.0  MCV 88.2 88.9  MCH 27.6 26.9  MCHC 31.3 30.2  RDW 14.8 14.6  PLT 154 166    Cardiac Enzymes Recent Labs  Lab 08/09/18 1205  TROPONINI <0.03   No results for input(s): TROPIPOC in the last 168 hours.   BNPNo results for input(s): BNP, PROBNP in the last 168 hours.   DDimer No results for input(s): DDIMER in the last 168 hours.   Radiology    Dg Chest Portable 1 View  Result Date: 08/09/2018 CLINICAL DATA:  Chest pain.  Smoker. EXAM: PORTABLE CHEST 1 VIEW COMPARISON:  03/03/2018 FINDINGS: The heart size and mediastinal contours are within normal limits. Both lungs are clear. The visualized skeletal structures are unremarkable. IMPRESSION: No active disease. Electronically Signed   By: Kerby Moors M.D.   On: 08/09/2018 12:24    Cardiac Studies   Cardiac Cath 08/09/2018 Conclusions: 1. Mild to moderate,  non-obstructive coronary artery disease involving the mid LAD and RCA. 2. Widely patent proximal/mid LCx stent. 3. Normal left ventricular contraction with mildly elevated filling pressure.  Recommendations: 1. Medical therapy and aggressive secondary prevention, including smoking cessation, weight loss, and lipid control.  Consider increasing statin therapy if LDL not at goal (<70). 2. Start furosemide 20 mg daily for component of diastolic heart failure.  Recommend Aspirin 81mg  daily for moderate CAD.   Patient Profile     52 y.o. female with a history of a SCID status post non-STEMI in 2014 treated with DES to the left circumflex.  Presented with one-week history of chest pain with exertion.  EKG showed no ischemia.  Admitted for further evaluation.  Assessment & Plan    1.  ASCAD    -status post NSTEMI in 2014 treated with DES to left circumflex. -Admitted with chest pain negative troponin -Cath showed patent left circumflex stent with 40% mid LAD. -Continue medical management with aspirin 81 mg daily, beta-blocker and statin.  2.  HTN -BP is well controlled on exam today at 125/70 mmHg. -Bisoprolol HCT changed to Bisoprolol 5mg  daily and Lasix added for diastolic CHF  3.  Hyperlipidemia -LDL goal is less than 70. -LDL admission was 105 with triglycerides 264. -Increase Crestor to 20 mg daily -Add Vascepa 2gm BID -repeat FLP and ALT in 6 weeks -check HbA1C -encourage weight loss program  4.  Tobacco abuse counseling -counseled on need to stop smoking.  -she is smoking 2ppd -discussed with her that this is the most important thing she can do for herself to prevent future cardiac events  5.  Chronic diastolic CHF -mildly elevated LVEDP at cath -start Lasix 20mg  daily   Patient  is stable from a cardiac standpoint for discharge today.  She will have follow-up with Dr. Saunders Revel in the office.  She will need a repeat FLP and ALT in 6 weeks after increasing Crestor and starting Vascepa.  For questions or updates, please contact [place] Please consult [email or web address] for contact info under Cardiology/STEMI.      Signed, Fransico Him, MD  08/10/2018, 8:59 AM

## 2018-08-10 NOTE — Discharge Summary (Signed)
Discharge Summary    Patient ID: Shannon [name] MRN: [id number]; DOB: Apr 23, 1966  Admit date: 08/09/2018 Discharge date: 08/10/2018  Primary Care Provider: Glenford Bayley, DO  Primary Cardiologist: Kate Sable, MD  Primary Electrophysiologist:  None  Discharge Diagnoses    Principal Problem:   Unstable angina [facility]) Active Problems:   Coronary artery disease   Hyperlipidemia   Hypertension   Tobacco use disorder   Chronic diastolic HF (heart failure) (HCC)  Allergies Allergies  Allergen Reactions  . Morphine And Related Other (See Comments)    PATIENT REFUSES THIS MEDICATION: states that she does not tolerate this medication well    Diagnostic Studies/Procedures    Cardiac Cath 08/09/2018 Conclusions: 1. Mild to moderate, non-obstructive coronary artery disease involving the mid LAD and RCA. 2. Widely patent proximal/mid LCx stent. 3. Normal left ventricular contraction with mildly elevated filling pressure.  Recommendations: 1. Medical therapy and aggressive secondary prevention, including smoking cessation, weight loss, and lipid control. Consider increasing statin therapy if LDL not at goal (<70). 2. Start furosemide 20 mg daily for component of diastolic heart failure.  Recommend Aspirin 81mg  daily for moderate CAD. _____________   History of Present Illness     52 y.o. female with a history of a SCID status post non-STEMI in 2014 treated with DES to the left circumflex.  last nuc was 2016 and neg for ischemia.  Last echo 02/2018 with mild LVH.  EF 55-60%.   Presented to the office yesterday with one-week history of chest pain with exertion.  EKG showed no ischemia.  Sent to ER for futher eval.  Admitted to obs at [facility] and planned for cath.  They were able to proceed with cath same day.    Hospital Course     Consultants:  None   Her cardiac cath with non obstructive disease and prior stent is patent.  LVEDP was elevated so HCTZ stopped and lasix added.   She did well post cath and during the night.  She will continue with BB, statin.  BP stable.  With LDL of 105 Crestor was increased to 20 mg daily.   She was instructed on tobacco cessation.  She has chronic diastolic CHF with mildly elevated LVEDP so on Lasix.    Today she was seen by Dr. Radford Pax  _____________  Discharge Vitals Blood pressure 125/70, pulse 70, temperature 98.1 F (36.7 C), temperature source Oral, resp. rate 18, weight 127.8 kg, SpO2 99 %.  Filed Weights   08/09/18 1147 08/10/18 0523  Weight: 127.9 kg 127.8 kg    Labs & Radiologic Studies    CBC Recent Labs    08/09/18 1151 08/10/18 0539  WBC 8.7 6.5  HGB 14.5 13.3  HCT 46.3* 44.0  MCV 88.2 88.9  PLT 154 607   Basic Metabolic Panel Recent Labs    08/09/18 1151 08/10/18 0539  NA 137 135  K 4.3 3.9  CL 103 105  CO2 27 23  GLUCOSE 115* 114*  BUN 11 8  CREATININE 0.91 1.02*  CALCIUM 9.1 8.5*   Liver Function Tests No results for input(s): AST, ALT, ALKPHOS, BILITOT, PROT, ALBUMIN in the last 72 hours. No results for input(s): LIPASE, AMYLASE in the last 72 hours. Cardiac Enzymes Recent Labs    08/09/18 1205  TROPONINI <0.03   BNP Invalid input(s): POCBNP D-Dimer No results for input(s): DDIMER in the last 72 hours. Hemoglobin A1C No results for input(s): HGBA1C in the last 72 hours. Fasting Lipid Panel Recent  Labs    08/10/18 0539  CHOL 194  HDL 36*  LDLCALC 105*  TRIG 264*  CHOLHDL 5.4   Thyroid Function Tests No results for input(s): TSH, T4TOTAL, T3FREE, THYROIDAB in the last 72 hours.  Invalid input(s): FREET3 _____________  Dg Chest Portable 1 View  Result Date: 08/09/2018 CLINICAL DATA:  Chest pain.  Smoker. EXAM: PORTABLE CHEST 1 VIEW COMPARISON:  03/03/2018 FINDINGS: The heart size and mediastinal contours are within normal limits. Both lungs are clear. The visualized skeletal structures are unremarkable. IMPRESSION: No active disease. Electronically Signed   By: Kerby Moors M.D.   On: 08/09/2018 12:24   Disposition   Pt is being discharged home today in good condition.  Follow-up Plans & Appointments   Call [facility] at [phone] if any bleeding, swelling or drainage at cath site.  May shower, no tub baths for 48 hours for groin sticks. No lifting over 5 pounds for 3 days.  No Driving for 3 days  We added Lasix daily for some extra volume and stopped the HCTZ   Heart healthy low salt diet.  Follow-up Information    Herminio Commons, MD Follow up on 09/09/2018.   Specialty:  Cardiology Why:  at 2:00 pm with his PA [place] information: [place] [id number] [phone]            Discharge Medications   Allergies as of 08/10/2018      Reactions   Morphine And Related Other (See Comments)   PATIENT REFUSES THIS MEDICATION: states that she does not tolerate this medication well      Medication List    STOP taking these medications   bisoprolol-hydrochlorothiazide 5-6.25 MG tablet Commonly known as:  ZIAC   ibuprofen 200 MG tablet Commonly known as:  ADVIL,MOTRIN     TAKE these medications   albuterol 108 (90 Base) MCG/ACT inhaler Commonly known as:  PROVENTIL HFA;VENTOLIN HFA Inhale 2 puffs into the lungs every 4 (four) hours as needed for wheezing or shortness of breath.   ALPRAZolam 1 MG tablet Commonly known as:  XANAX Take 1 mg by mouth 2 (two) times daily as needed.   aspirin EC 81 MG tablet Take by mouth.   bisoprolol 5 MG tablet Commonly known as:  ZEBETA Take 1 tablet (5 mg total) by mouth daily. Start taking on:  08/11/2018   escitalopram 20 MG tablet Commonly known as:  LEXAPRO Take 1 tablet (20 mg total) by mouth daily.   fluticasone 50 MCG/ACT nasal spray Commonly known as:  FLONASE fluticasone propionate 50 mcg/actuation nasal spray,suspension  SPRAY 2 SPRAYS INTO EACH NOSTRIL EVERY DAY   furosemide 20 MG tablet Commonly known as:   LASIX Take 1 tablet (20 mg total) by mouth daily. Start taking on:  08/11/2018   ipratropium-albuterol 0.5-2.5 (3) MG/3ML Soln Commonly known as:  DUONEB Inhale 3 mLs into the lungs every 4 (four) hours as needed.   nitroGLYCERIN 0.4 MG SL tablet Commonly known as:  NITROSTAT Place 1 tablet (0.4 mg total) under the tongue every 5 (five) minutes as needed for chest pain (up to 3 doses).   rosuvastatin 20 MG tablet Commonly known as:  CRESTOR Take 1 tablet (20 mg total) by mouth at bedtime. What changed:    medication strength  how much to take        Acute coronary syndrome (MI, NSTEMI, STEMI, etc) this admission?: No.    Outstanding Labs/Studies  Needs FLP and ALT in 6 weeks and check HGB A1C.  May consider beginning Vascepa as outpt.   Duration of Discharge Encounter   Greater than 30 minutes including physician time.  Signed, Cecilie Kicks, NP 08/10/2018, 10:19 AM

## 2018-08-10 NOTE — Discharge Instructions (Signed)
Call [facility] at [phone] if any bleeding, swelling or drainage at cath site.  May shower, no tub baths for 48 hours for groin sticks. No lifting over 5 pounds for 3 days.  No Driving for 3 days  We added Lasix daily for some extra volume and stopped the HCTZ   Heart healthy low salt diet.

## 2018-08-12 ENCOUNTER — Telehealth: Payer: Self-pay | Admitting: Cardiovascular Disease

## 2018-08-12 NOTE — Telephone Encounter (Signed)
Patient has not eaten , has sensation of wave like movement in stomach and lower chest. Last night she had trouble swallowing rice. I encouraged her to call pcp.She has dx GERD but was not aware  And takes no medication for it.She states this is not the first time she has had trouble swallowing. She is going to take simeticone and then call pcp    She wants to hear from Gerrianne Scale PA-C, or Dr.Koneswaran regarding her cath itself.She doesn't remember the MD talking to her after cath.She remembers something about the "widow maker" showing "stiffness" and more progression of occlusion and is scared

## 2018-08-12 NOTE — Telephone Encounter (Signed)
Pt notified of Dr.Koneswaran's message and states she has reduced smoking from 2 /ppd to one and will continue to smoke less.

## 2018-08-12 NOTE — Telephone Encounter (Signed)
Patient just had angioplasty and has questions about issues she is experiencing.  Chest area has like waves of gas and nothing is relieving it.  Food is getting stuck in that area also

## 2018-08-12 NOTE — Telephone Encounter (Signed)
The stent in the circumflex is widely open.  There are mild blockages in the RCA and moderate blockage in the LAD of up to 40%.  These are not causing significant obstruction.  She needs tobacco cessation to prevent progression.  Continue aspirin and statin therapy.

## 2018-08-30 DIAGNOSIS — Z6841 Body Mass Index (BMI) 40.0 and over, adult: Secondary | ICD-10-CM | POA: Diagnosis not present

## 2018-08-30 DIAGNOSIS — I119 Hypertensive heart disease without heart failure: Secondary | ICD-10-CM | POA: Diagnosis not present

## 2018-08-30 DIAGNOSIS — F17200 Nicotine dependence, unspecified, uncomplicated: Secondary | ICD-10-CM | POA: Diagnosis not present

## 2018-08-30 DIAGNOSIS — E782 Mixed hyperlipidemia: Secondary | ICD-10-CM | POA: Diagnosis not present

## 2018-09-07 DIAGNOSIS — J441 Chronic obstructive pulmonary disease with (acute) exacerbation: Secondary | ICD-10-CM | POA: Diagnosis not present

## 2018-09-09 ENCOUNTER — Ambulatory Visit: Payer: Self-pay | Admitting: Student

## 2018-09-28 DIAGNOSIS — I10 Essential (primary) hypertension: Secondary | ICD-10-CM | POA: Diagnosis not present

## 2018-09-28 DIAGNOSIS — H5210 Myopia, unspecified eye: Secondary | ICD-10-CM | POA: Diagnosis not present

## 2018-10-06 DIAGNOSIS — M25561 Pain in right knee: Secondary | ICD-10-CM | POA: Diagnosis not present

## 2018-10-06 DIAGNOSIS — M25551 Pain in right hip: Secondary | ICD-10-CM | POA: Diagnosis not present

## 2018-10-08 ENCOUNTER — Ambulatory Visit: Payer: Self-pay | Admitting: Student

## 2018-10-08 DIAGNOSIS — J441 Chronic obstructive pulmonary disease with (acute) exacerbation: Secondary | ICD-10-CM | POA: Diagnosis not present

## 2018-10-12 DIAGNOSIS — J0100 Acute maxillary sinusitis, unspecified: Secondary | ICD-10-CM | POA: Diagnosis not present

## 2018-10-12 DIAGNOSIS — H6982 Other specified disorders of Eustachian tube, left ear: Secondary | ICD-10-CM | POA: Diagnosis not present

## 2018-11-01 DIAGNOSIS — D069 Carcinoma in situ of cervix, unspecified: Secondary | ICD-10-CM | POA: Diagnosis not present

## 2018-11-01 DIAGNOSIS — N958 Other specified menopausal and perimenopausal disorders: Secondary | ICD-10-CM | POA: Diagnosis not present

## 2018-11-01 DIAGNOSIS — A630 Anogenital (venereal) warts: Secondary | ICD-10-CM | POA: Diagnosis not present

## 2018-11-08 DIAGNOSIS — J441 Chronic obstructive pulmonary disease with (acute) exacerbation: Secondary | ICD-10-CM | POA: Diagnosis not present

## 2018-11-22 DIAGNOSIS — H6123 Impacted cerumen, bilateral: Secondary | ICD-10-CM | POA: Diagnosis not present

## 2018-11-22 DIAGNOSIS — F17210 Nicotine dependence, cigarettes, uncomplicated: Secondary | ICD-10-CM | POA: Diagnosis not present

## 2018-11-22 DIAGNOSIS — H9193 Unspecified hearing loss, bilateral: Secondary | ICD-10-CM | POA: Diagnosis not present

## 2018-11-22 DIAGNOSIS — J209 Acute bronchitis, unspecified: Secondary | ICD-10-CM | POA: Diagnosis not present

## 2018-11-25 DIAGNOSIS — B9689 Other specified bacterial agents as the cause of diseases classified elsewhere: Secondary | ICD-10-CM | POA: Diagnosis not present

## 2018-11-25 DIAGNOSIS — J019 Acute sinusitis, unspecified: Secondary | ICD-10-CM | POA: Diagnosis not present

## 2018-11-25 DIAGNOSIS — H6691 Otitis media, unspecified, right ear: Secondary | ICD-10-CM | POA: Diagnosis not present

## 2018-11-25 DIAGNOSIS — R05 Cough: Secondary | ICD-10-CM | POA: Diagnosis not present

## 2018-12-03 DIAGNOSIS — D071 Carcinoma in situ of vulva: Secondary | ICD-10-CM | POA: Diagnosis not present

## 2018-12-03 DIAGNOSIS — A630 Anogenital (venereal) warts: Secondary | ICD-10-CM | POA: Diagnosis not present

## 2018-12-03 DIAGNOSIS — Z1231 Encounter for screening mammogram for malignant neoplasm of breast: Secondary | ICD-10-CM | POA: Diagnosis not present

## 2018-12-03 DIAGNOSIS — Z87891 Personal history of nicotine dependence: Secondary | ICD-10-CM | POA: Diagnosis not present

## 2018-12-07 DIAGNOSIS — J441 Chronic obstructive pulmonary disease with (acute) exacerbation: Secondary | ICD-10-CM | POA: Diagnosis not present

## 2018-12-16 DIAGNOSIS — M9905 Segmental and somatic dysfunction of pelvic region: Secondary | ICD-10-CM | POA: Diagnosis not present

## 2018-12-16 DIAGNOSIS — M9904 Segmental and somatic dysfunction of sacral region: Secondary | ICD-10-CM | POA: Diagnosis not present

## 2018-12-16 DIAGNOSIS — M5136 Other intervertebral disc degeneration, lumbar region: Secondary | ICD-10-CM | POA: Diagnosis not present

## 2018-12-16 DIAGNOSIS — M9903 Segmental and somatic dysfunction of lumbar region: Secondary | ICD-10-CM | POA: Diagnosis not present

## 2018-12-20 DIAGNOSIS — B078 Other viral warts: Secondary | ICD-10-CM | POA: Diagnosis not present

## 2018-12-20 DIAGNOSIS — D0461 Carcinoma in situ of skin of right upper limb, including shoulder: Secondary | ICD-10-CM | POA: Diagnosis not present

## 2018-12-20 DIAGNOSIS — D485 Neoplasm of uncertain behavior of skin: Secondary | ICD-10-CM | POA: Diagnosis not present

## 2018-12-20 DIAGNOSIS — Z85828 Personal history of other malignant neoplasm of skin: Secondary | ICD-10-CM | POA: Diagnosis not present

## 2018-12-20 DIAGNOSIS — M9905 Segmental and somatic dysfunction of pelvic region: Secondary | ICD-10-CM | POA: Diagnosis not present

## 2018-12-20 DIAGNOSIS — M9903 Segmental and somatic dysfunction of lumbar region: Secondary | ICD-10-CM | POA: Diagnosis not present

## 2018-12-20 DIAGNOSIS — M9904 Segmental and somatic dysfunction of sacral region: Secondary | ICD-10-CM | POA: Diagnosis not present

## 2018-12-20 DIAGNOSIS — D2261 Melanocytic nevi of right upper limb, including shoulder: Secondary | ICD-10-CM | POA: Diagnosis not present

## 2018-12-20 DIAGNOSIS — D225 Melanocytic nevi of trunk: Secondary | ICD-10-CM | POA: Diagnosis not present

## 2018-12-20 DIAGNOSIS — L821 Other seborrheic keratosis: Secondary | ICD-10-CM | POA: Diagnosis not present

## 2018-12-20 DIAGNOSIS — D2262 Melanocytic nevi of left upper limb, including shoulder: Secondary | ICD-10-CM | POA: Diagnosis not present

## 2018-12-20 DIAGNOSIS — M5136 Other intervertebral disc degeneration, lumbar region: Secondary | ICD-10-CM | POA: Diagnosis not present

## 2019-01-05 DIAGNOSIS — I6522 Occlusion and stenosis of left carotid artery: Secondary | ICD-10-CM | POA: Diagnosis not present

## 2019-01-05 DIAGNOSIS — E782 Mixed hyperlipidemia: Secondary | ICD-10-CM | POA: Diagnosis not present

## 2019-01-05 DIAGNOSIS — I10 Essential (primary) hypertension: Secondary | ICD-10-CM | POA: Diagnosis not present

## 2019-01-05 DIAGNOSIS — F411 Generalized anxiety disorder: Secondary | ICD-10-CM | POA: Diagnosis not present

## 2019-01-05 DIAGNOSIS — F17200 Nicotine dependence, unspecified, uncomplicated: Secondary | ICD-10-CM | POA: Diagnosis not present

## 2019-01-05 DIAGNOSIS — F332 Major depressive disorder, recurrent severe without psychotic features: Secondary | ICD-10-CM | POA: Diagnosis not present

## 2019-01-07 DIAGNOSIS — J441 Chronic obstructive pulmonary disease with (acute) exacerbation: Secondary | ICD-10-CM | POA: Diagnosis not present

## 2019-01-20 DIAGNOSIS — M545 Low back pain: Secondary | ICD-10-CM | POA: Diagnosis not present

## 2019-01-20 DIAGNOSIS — M25551 Pain in right hip: Secondary | ICD-10-CM | POA: Diagnosis not present

## 2019-01-20 DIAGNOSIS — M25561 Pain in right knee: Secondary | ICD-10-CM | POA: Diagnosis not present

## 2019-02-06 DIAGNOSIS — J441 Chronic obstructive pulmonary disease with (acute) exacerbation: Secondary | ICD-10-CM | POA: Diagnosis not present

## 2019-02-11 DIAGNOSIS — M545 Low back pain: Secondary | ICD-10-CM | POA: Diagnosis not present

## 2019-02-11 DIAGNOSIS — M25551 Pain in right hip: Secondary | ICD-10-CM | POA: Diagnosis not present

## 2019-02-11 DIAGNOSIS — Z6841 Body Mass Index (BMI) 40.0 and over, adult: Secondary | ICD-10-CM | POA: Diagnosis not present

## 2019-03-09 DIAGNOSIS — J441 Chronic obstructive pulmonary disease with (acute) exacerbation: Secondary | ICD-10-CM | POA: Diagnosis not present

## 2019-05-30 DIAGNOSIS — F17200 Nicotine dependence, unspecified, uncomplicated: Secondary | ICD-10-CM | POA: Diagnosis not present

## 2019-05-30 DIAGNOSIS — E559 Vitamin D deficiency, unspecified: Secondary | ICD-10-CM | POA: Diagnosis not present

## 2019-05-30 DIAGNOSIS — E538 Deficiency of other specified B group vitamins: Secondary | ICD-10-CM | POA: Diagnosis not present

## 2019-05-30 DIAGNOSIS — E782 Mixed hyperlipidemia: Secondary | ICD-10-CM | POA: Diagnosis not present

## 2019-05-30 DIAGNOSIS — M797 Fibromyalgia: Secondary | ICD-10-CM | POA: Diagnosis not present

## 2019-05-30 DIAGNOSIS — F332 Major depressive disorder, recurrent severe without psychotic features: Secondary | ICD-10-CM | POA: Diagnosis not present

## 2019-05-30 DIAGNOSIS — F411 Generalized anxiety disorder: Secondary | ICD-10-CM | POA: Diagnosis not present

## 2019-05-30 DIAGNOSIS — R52 Pain, unspecified: Secondary | ICD-10-CM | POA: Diagnosis not present

## 2019-05-30 DIAGNOSIS — R234 Changes in skin texture: Secondary | ICD-10-CM | POA: Diagnosis not present

## 2019-05-31 DIAGNOSIS — M25551 Pain in right hip: Secondary | ICD-10-CM | POA: Diagnosis not present

## 2019-05-31 DIAGNOSIS — M25552 Pain in left hip: Secondary | ICD-10-CM | POA: Diagnosis not present

## 2019-05-31 DIAGNOSIS — M545 Low back pain: Secondary | ICD-10-CM | POA: Diagnosis not present

## 2019-06-09 DIAGNOSIS — M545 Low back pain: Secondary | ICD-10-CM | POA: Diagnosis not present

## 2019-06-16 DIAGNOSIS — M25552 Pain in left hip: Secondary | ICD-10-CM | POA: Diagnosis not present

## 2019-06-16 DIAGNOSIS — M25551 Pain in right hip: Secondary | ICD-10-CM | POA: Diagnosis not present

## 2019-07-07 DIAGNOSIS — Z20828 Contact with and (suspected) exposure to other viral communicable diseases: Secondary | ICD-10-CM | POA: Diagnosis not present

## 2019-07-12 DIAGNOSIS — Z720 Tobacco use: Secondary | ICD-10-CM | POA: Diagnosis not present

## 2019-07-12 DIAGNOSIS — J069 Acute upper respiratory infection, unspecified: Secondary | ICD-10-CM | POA: Diagnosis not present

## 2019-07-26 DIAGNOSIS — Z7982 Long term (current) use of aspirin: Secondary | ICD-10-CM | POA: Diagnosis not present

## 2019-07-26 DIAGNOSIS — J449 Chronic obstructive pulmonary disease, unspecified: Secondary | ICD-10-CM | POA: Diagnosis not present

## 2019-07-26 DIAGNOSIS — F17200 Nicotine dependence, unspecified, uncomplicated: Secondary | ICD-10-CM | POA: Diagnosis not present

## 2019-07-26 DIAGNOSIS — I6523 Occlusion and stenosis of bilateral carotid arteries: Secondary | ICD-10-CM | POA: Diagnosis not present

## 2019-08-04 ENCOUNTER — Other Ambulatory Visit: Payer: Self-pay | Admitting: *Deleted

## 2019-08-04 DIAGNOSIS — D071 Carcinoma in situ of vulva: Secondary | ICD-10-CM | POA: Diagnosis not present

## 2019-08-04 MED ORDER — FUROSEMIDE 20 MG PO TABS: 20.0000 mg | ORAL_TABLET | Freq: Every day | ORAL | 0 refills | Status: DC

## 2019-08-27 ENCOUNTER — Other Ambulatory Visit: Payer: Self-pay | Admitting: Cardiovascular Disease

## 2019-09-01 DIAGNOSIS — M25552 Pain in left hip: Secondary | ICD-10-CM | POA: Diagnosis not present

## 2019-09-01 DIAGNOSIS — M25551 Pain in right hip: Secondary | ICD-10-CM | POA: Diagnosis not present

## 2019-09-01 DIAGNOSIS — R262 Difficulty in walking, not elsewhere classified: Secondary | ICD-10-CM | POA: Diagnosis not present

## 2019-09-01 DIAGNOSIS — R079 Chest pain, unspecified: Secondary | ICD-10-CM | POA: Diagnosis not present

## 2019-09-02 DIAGNOSIS — M25552 Pain in left hip: Secondary | ICD-10-CM | POA: Diagnosis not present

## 2019-09-02 DIAGNOSIS — M25551 Pain in right hip: Secondary | ICD-10-CM | POA: Diagnosis not present

## 2019-09-02 DIAGNOSIS — M1611 Unilateral primary osteoarthritis, right hip: Secondary | ICD-10-CM | POA: Diagnosis not present

## 2019-09-02 DIAGNOSIS — M1612 Unilateral primary osteoarthritis, left hip: Secondary | ICD-10-CM | POA: Diagnosis not present

## 2019-09-08 DIAGNOSIS — M25552 Pain in left hip: Secondary | ICD-10-CM | POA: Diagnosis not present

## 2019-10-19 NOTE — H&P (Signed)
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  Past Medical History:  Diagnosis Date  . Anemia   . Anxiety   . Carotid artery occlusion   . Chronic diastolic HF (heart failure) ([place]) 08/10/2018  . COPD (chronic obstructive pulmonary disease) ([place])   . Coronary artery disease    LHC 11/25/12 90% stenosis mid LCx & otherwise nonobstructive dz w/ EF 60-65% S/p PTCA/DES to LCx  . Depression   . Exertional dyspnea    chronic  . History of cardiovascular stress test 06/2015   low risk  . Hyperlipidemia   . Hypertension   . MI (myocardial infarction) ([place])   . Tobacco abuse    Past Surgical History:  Procedure Laterality Date  . CORONARY ANGIOPLASTY WITH STENT PLACEMENT    . LEFT HEART CATH AND CORONARY ANGIOGRAPHY N/A 08/09/2018   Procedure: LEFT HEART CATH AND CORONARY ANGIOGRAPHY;  Surgeon: Nelva Bush, MD;  Location: [place] CV LAB;  Service: Cardiovascular;  Laterality: N/A;  . LEFT HEART CATHETERIZATION WITH CORONARY ANGIOGRAM N/A 11/15/2012   Procedure: LEFT HEART CATHETERIZATION WITH CORONARY ANGIOGRAM;  Surgeon: Thayer Headings, MD;  Location: [facility] C[facility] LAB;  Service: Cardiovascular;  Laterality: N/A;  . PERCUTANEOUS CORONARY STENT INTERVENTION (PCI-S)  11/15/2012   Procedure: PERCUTANEOUS CORONARY STENT INTERVENTION (PCI-S);  Surgeon: Thayer Headings, MD;  Location: [facility] CATH LAB;  Service: Cardiovascular;;  . skin cancer removed right lower leg Right   . TONSILLECTOMY    . uterus ablation     Scheduled Meds: Continuous Infusions: PRN Meds:. Allergies Morphine  Family History  Problem Relation Age of Onset  . Depression Mother   . Anxiety disorder Maternal Grandmother   . Anxiety disorder Paternal Grandmother   . OCD Paternal Grandmother   . Depression Paternal Grandmother   . Heart attack Father        Deceased, 86  . Cerebral aneurysm Father   . Heart disease Father    There were no vitals taken for this visit. General alert  and oriented Lung CTAB Car RRR Abdomen is soft and non tender  Pelvic  Lesion on right upper inner labia and perineum consistent with VIN 3  IMPRESSION: VIN 3  Will proceed with Wide local of excision of VIN 3

## 2019-10-26 ENCOUNTER — Ambulatory Visit: Admit: 2019-10-26 | Payer: Medicare HMO | Admitting: Orthopedic Surgery

## 2019-10-26 DIAGNOSIS — M25551 Pain in right hip: Secondary | ICD-10-CM | POA: Diagnosis not present

## 2019-10-26 SURGERY — ARTHROPLASTY, HIP, TOTAL, ANTERIOR APPROACH
Anesthesia: Choice | Site: Hip | Laterality: Right

## 2019-11-01 ENCOUNTER — Other Ambulatory Visit: Payer: Self-pay

## 2019-11-01 ENCOUNTER — Encounter (HOSPITAL_BASED_OUTPATIENT_CLINIC_OR_DEPARTMENT_OTHER): Payer: Self-pay | Admitting: Obstetrics and Gynecology

## 2019-11-01 NOTE — Progress Notes (Addendum)
Spoke w/ via phone for pre-op interview---[facility] needs dos----    I stat 8, ekg, type and screen          Lab results------ COVID test ------11-04-2019 Arrive at -------530 am 11-08-2019 NPO after ------midnight Medications to take morning of surgery -----alprazolam prn, albuterol inhaler prn/bring inhaler,  Diabetic medication -----n/a Patient Special Instructions ----- Pre-Op special Istructions ----- Patient verbalized understanding of instructions that were given at this phone interview. Patient denies shortness of breath, chest pain, fever, cough a this phone interview.  Anesthesia: cad Addendum :spoke with jessica zanetto pa, ok to proceed for 11-08-2019 surgery, lov dr Bronson Ing cardiology 11-07-2019 epic  [id number] nelson np [facility] Cardiologist :dr Forde Dandy 08-09-2018 chart/epic Chest x-ray :none EKG :none Echo :03-11-2018 chart/epic Cardiac Cath : 08-09-2018 chart/epic lov vascular ross davis 10-31-2019 care everywher/chart Sleep Study/ CPAP :n/a Fasting Blood Sugar :      / Checks Blood Sugar -- times a day:   Blood Thinner/ Instructions /Last Dose:n/a ASA / Instructions/ Last Dose : patient to call dr drewal about if needs to stop 81 mg aspirin for surgery  Patient denies shortness of breath, chest pain, fever, and cough at this phone interview.

## 2019-11-03 ENCOUNTER — Telehealth: Payer: Self-pay

## 2019-11-03 NOTE — Progress Notes (Signed)
Left voicemail message with heather calhoun at dr Helane Rima office, patient needs cardiac clearnce per Janett Billow zanetto pa

## 2019-11-03 NOTE — Progress Notes (Addendum)
Anesthesia Chart Review   Case: [id number] Date/Time: 11/08/19 0715   Procedure: excision of VULVA, vulvoscopy (N/A )   Anesthesia type: Choice   Pre-op diagnosis: VIN 3   Location: [facility] OR ROOM 1 / [place]   Surgeons: Dian Queen, MD      DISCUSSION:54 y.o. current every day smoker (22 pack years) with h/o HTN, HLD, CAD (NSTEMI DES to left circumflex 2014), COPD, CHF, GERD scheduled for above procedure 11/08/19 with Dr. Dian Queen.   S/p DES 2014.  Evaluated for chest pain 07/2018.  Per cardiac cath stent widely open, mild blockages to RCA and moderate in LAD up to 40% without significant obstruction.  Medical therapy recommended. Cardiac clearance requested.   Pt last seen by vascular surgery regarding carotid artery stenosis.  Per OV note, "asymptomatic R ICA stenosis.  She has L ICA occlusion.  We discussed all of this.  She will continue ASA and statin.  She should stop smoking.  I will see her back in 6 months with updated duplex in surveillance of R side stenosis.  Her L ICA needs no intervention, now being occluded.  We will see her back in 6 months."  Addendum 11/07/19:  Pt seen by cardiologist, Dr. Kate Sable, 11/07/19.  Per OV note CAD symptomatically stable, no changes to medical therapy. 6 month follow up recommended.  No further cardiac testing recommended.  VS: Ht 5\' 5"  (1.651 m)   Wt 106.6 kg   LMP 07/01/2015   BMI 39.11 kg/m   PROVIDERS: Glenford Bayley, DO is PCP   Concha Se, MD is Vascular Surgeon LABS: labs DOS (all labs ordered are listed, but only abnormal results are displayed)  Labs Reviewed - No data to display   IMAGES: Carotid US 01/13/2018 Final Interpretation:  Right Carotid: Velocities in the right ICA are consistent with a 40-59%         stenosis. This may be compensatory flow.   Left Carotid: Limited visualization. Flow noted in the ICA. Unable to  obtain        velocities in the 80-99% stenosis  range as noted on previous        study, however, this may be due to sub-optimal  visualization.   Vertebrals: Bilateral vertebral arteries demonstrate antegrade flow.  Subclavians: Normal flow hemodynamics were seen in bilateral subclavian        arteries.   EKG:   CV: Cardiac Cath 08/09/2018 Conclusions: 1. Mild to moderate, non-obstructive coronary artery disease involving the mid LAD and RCA. 2. Widely patent proximal/mid LCx stent. 3. Normal left ventricular contraction with mildly elevated filling pressure.  Recommendations: 1. Medical therapy and aggressive secondary prevention, including smoking cessation, weight loss, and lipid control.  Consider increasing statin therapy if LDL not at goal (<70). 2. Start furosemide 20 mg daily for component of diastolic heart failure.  Recommend Aspirin 81mg  daily for moderate CAD.   03/11/2018 Study Conclusions   - Procedure narrative: Transthoracic echocardiography. Image  quality was suboptimal. Contrast enhancement was employed.  - Left ventricle: The cavity size was normal. Wall thickness was  increased in a pattern of mild LVH. Systolic function was normal.  The estimated ejection fraction was in the range of 55% to 60%.  Wall motion was normal; there were no regional wall motion  abnormalities. Left ventricular diastolic function parameters  were normal.  Past Medical History:  Diagnosis Date  . Anemia   . Anxiety   . Arthritis  oa needs hip replacement on right  . 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 Carotid artery occlusion   . Chronic diastolic HF (heart failure) ([place])    pt denies  . COPD (chronic obstructive pulmonary disease) ([place])    pt denies  . Coronary artery disease    LHC 11/25/12 90% stenosis mid LCx & otherwise nonobstructive dz w/ EF 60-65% S/p PTCA/DES to LCx  . Depression   . Elevated cholesterol   .  Exertional dyspnea    chronic  . GERD (gastroesophageal reflux disease)   . History of cardiovascular stress test 06/2015   low risk  . HPV in female   . Hyperlipidemia   . Hypertension   . MI (myocardial infarction) ([place]) fe 17, 2014  . Obesity   . Polycystic ovary disease   . Skin tear of upper arm without complication, left, sequela    small skin tear left upper arm no drainage for 1 week  . Tobacco abuse     Past Surgical History:  Procedure Laterality Date  . CORONARY ANGIOPLASTY WITH STENT PLACEMENT    . LEFT HEART CATH AND CORONARY ANGIOGRAPHY N/A 08/09/2018   Procedure: LEFT HEART CATH AND CORONARY ANGIOGRAPHY;  Surgeon: Nelva Bush, MD;  Location: [place] CV LAB;  Service: Cardiovascular;  Laterality: N/A;  . LEFT HEART CATHETERIZATION WITH CORONARY ANGIOGRAM N/A 11/15/2012   Procedure: LEFT HEART CATHETERIZATION WITH CORONARY ANGIOGRAM;  Surgeon: Thayer Headings, MD;  Location: [facility] CATH LAB;  Service: Cardiovascular;  Laterality: N/A;  . PERCUTANEOUS CORONARY STENT INTERVENTION (PCI-S)  11/15/2012   Procedure: PERCUTANEOUS CORONARY STENT INTERVENTION (PCI-S);  Surgeon: Thayer Headings, MD;  Location: [facility] CATH LAB;  Service: Cardiovascular;;  . skin cancer removed right lower leg Right   . TONSILLECTOMY    . uterus ablation      MEDICATIONS: No current facility-administered medications for this encounter.   Marland Kitchen ibuprofen (ADVIL) 200 MG tablet  . NON FORMULARY  . pantoprazole (PROTONIX) 40 MG tablet  . traMADol (ULTRAM) 50 MG tablet  . albuterol (PROVENTIL HFA;VENTOLIN HFA) 108 (90 Base) MCG/ACT inhaler  . ALPRAZolam (XANAX) 1 MG tablet  . aspirin EC 81 MG tablet  . bisoprolol (ZEBETA) 5 MG tablet  . escitalopram (LEXAPRO) 20 MG tablet  . fluticasone (FLONASE) 50 MCG/ACT nasal spray  . furosemide (LASIX) 20 MG tablet  . ipratropium-albuterol (DUONEB) 0.5-2.5 (3) MG/3ML SOLN  . nitroGLYCERIN (NITROSTAT) 0.4 MG SL tablet  . rosuvastatin (CRESTOR) 20 MG tablet     Maia Plan [facility] Pre-Surgical Testing [phone] 11/03/19  1:22 PM

## 2019-11-03 NOTE — Telephone Encounter (Signed)
Dr.Grewal contact person Nira Conn [phone]) is requesting surgical clearance from Cardiologist. Planning Out Pt procedure excision of vulva. Office is faxing notes.  Thanks renee

## 2019-11-03 NOTE — Telephone Encounter (Signed)
Placed on Dr.Koneswaran's desk

## 2019-11-04 ENCOUNTER — Other Ambulatory Visit (HOSPITAL_COMMUNITY)
Admission: RE | Admit: 2019-11-04 | Discharge: 2019-11-04 | Disposition: A | Discharge status: Home / Self Care | Payer: Medicare HMO | Source: Ambulatory Visit | Attending: Obstetrics and Gynecology | Admitting: Obstetrics and Gynecology

## 2019-11-04 ENCOUNTER — Other Ambulatory Visit: Payer: Self-pay

## 2019-11-04 ENCOUNTER — Telehealth: Payer: Self-pay | Admitting: Cardiovascular Disease

## 2019-11-04 DIAGNOSIS — Z01812 Encounter for preprocedural laboratory examination: Secondary | ICD-10-CM | POA: Insufficient documentation

## 2019-11-04 DIAGNOSIS — Z20822 Contact with and (suspected) exposure to covid-19: Secondary | ICD-10-CM | POA: Diagnosis not present

## 2019-11-04 LAB — SARS CORONAVIRUS 2 (TAT 6-24 HRS): SARS Coronavirus 2: NEGATIVE

## 2019-11-04 NOTE — Telephone Encounter (Signed)

## 2019-11-07 ENCOUNTER — Encounter: Payer: Self-pay | Admitting: Cardiovascular Disease

## 2019-11-07 ENCOUNTER — Telehealth (INDEPENDENT_AMBULATORY_CARE_PROVIDER_SITE_OTHER): Payer: Medicare HMO | Admitting: Cardiovascular Disease

## 2019-11-07 VITALS — BP 139/81 | HR 72 | Temp 95.3°F | Ht 65.0 in | Wt 235.0 lb

## 2019-11-07 DIAGNOSIS — F17210 Nicotine dependence, cigarettes, uncomplicated: Secondary | ICD-10-CM

## 2019-11-07 DIAGNOSIS — I6523 Occlusion and stenosis of bilateral carotid arteries: Secondary | ICD-10-CM

## 2019-11-07 DIAGNOSIS — I110 Hypertensive heart disease with heart failure: Secondary | ICD-10-CM

## 2019-11-07 DIAGNOSIS — Z716 Tobacco abuse counseling: Secondary | ICD-10-CM

## 2019-11-07 DIAGNOSIS — I10 Essential (primary) hypertension: Secondary | ICD-10-CM

## 2019-11-07 DIAGNOSIS — I25118 Atherosclerotic heart disease of native coronary artery with other forms of angina pectoris: Secondary | ICD-10-CM

## 2019-11-07 DIAGNOSIS — I5032 Chronic diastolic (congestive) heart failure: Secondary | ICD-10-CM | POA: Diagnosis not present

## 2019-11-07 DIAGNOSIS — I252 Old myocardial infarction: Secondary | ICD-10-CM

## 2019-11-07 DIAGNOSIS — E782 Mixed hyperlipidemia: Secondary | ICD-10-CM

## 2019-11-07 DIAGNOSIS — Z720 Tobacco use: Secondary | ICD-10-CM | POA: Diagnosis not present

## 2019-11-07 DIAGNOSIS — E785 Hyperlipidemia, unspecified: Secondary | ICD-10-CM

## 2019-11-07 DIAGNOSIS — Z955 Presence of coronary angioplasty implant and graft: Secondary | ICD-10-CM

## 2019-11-07 NOTE — Anesthesia Preprocedure Evaluation (Addendum)
Anesthesia Evaluation  Patient identified by MRN, date of birth, ID band Patient awake    Reviewed: Allergy & Precautions, NPO status , Patient's Chart, lab work & pertinent test results  Airway Mallampati: II  TM Distance: >3 FB Neck ROM: Full    Dental  (+) Upper Dentures, Lower Dentures   Pulmonary shortness of breath and with exertion, COPD, Current Smoker and Patient abstained from smoking.,  Current smoker, 80 pack year history   Pulmonary exam normal breath sounds clear to auscultation       Cardiovascular hypertension, Pt. on home beta blockers + angina with exertion + CAD, + Past MI, + CABG and +CHF  Normal cardiovascular exam Rhythm:Regular Rate:Normal  MI 2014: [place] 11/25/12 90% stenosis mid LCx & otherwise nonobstructive dz w/ EF 60-65% S/p PTCA/DES to LCx  Last echo 2019: Left ventricle: The cavity size was normal. Wall thickness wasincreased in a pattern of mild LVH. Systolic function was normal.The estimated ejection fraction was in the range of 55% to 60%.Wall motion was normal; there were no regional wall motionabnormalities. Left ventricular diastolic function parameterswere normal.   Subsequent cath 2019:  Lasix started for diastolic HF 1. Mild to moderate, non-obstructive coronary artery disease involving the mid LAD and RCA. 2. Widely patent proximal/mid LCx stent. 3. Normal left ventricular contraction with mildly elevated filling pressure.  60-79% RICA stenosis and 100% occlusion of the LICA.  Cardiologist aware of occasional chest tightness with exertion   Neuro/Psych PSYCHIATRIC DISORDERS Anxiety Depression 60-79% RICA stenosis and 100% occlusion of the LICA.    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, PCOS    Musculoskeletal  (+) Arthritis , Osteoarthritis,    Abdominal (+) +  obese,   Peds negative pediatric ROS (+)  Hematology negative hematology ROS (+)   Anesthesia Other Findings HLD  Reproductive/Obstetrics negative OB ROS                           Anesthesia Physical Anesthesia Plan  ASA: III  Anesthesia Plan: General   Post-op Pain Management:    Induction: Intravenous  PONV Risk Score and Plan: 3 and Ondansetron, Dexamethasone, Midazolam, Treatment may vary due to age or medical condition and Scopolamine patch - Pre-op  Airway Management Planned: LMA  Additional Equipment: None  Intra-op Plan:   Post-operative Plan: Extubation in OR  Informed Consent: I have reviewed the patients History and Physical, chart, labs and discussed the procedure including the risks, benefits and alternatives for the proposed anesthesia with the patient or authorized representative who has indicated his/her understanding and acceptance.     Dental advisory given  Plan Discussed with: CRNA  Anesthesia Plan Comments: (Maintain BP within 10% of baseline given severe carotid disease and moderate CAD. Maintain low/normal HR to decrease myocardial oxygen demand. Explained to the patient the importance of being able to deliver adequate oxygen through the breathing device, especially in a patient with coronary, carotid and lung disease. Pt upset because she was promised sedation by the OB office for this procedure. )      Anesthesia Quick Evaluation

## 2019-11-07 NOTE — Patient Instructions (Signed)

## 2019-11-07 NOTE — Progress Notes (Signed)
Virtual Visit via Telephone Note   This visit type was conducted due to national recommendations for restrictions regarding the COVID-19 Pandemic (e.g. social distancing) in an effort to limit this patient's exposure and mitigate transmission in our community.  Due to her co-morbid illnesses, this patient is at least at moderate risk for complications without adequate follow up.  This format is felt to be most appropriate for this patient at this time.  The patient did not have access to video technology/had technical difficulties with video requiring transitioning to audio format only (telephone).  All issues noted in this document were discussed and addressed.  No physical exam could be performed with this format.  Please refer to the patient's chart for her  consent to telehealth for [facility].   Date:  11/07/2019   ID:  Shannon, [name] 1966/02/18, MRN [id number]  Patient Location: Home Provider Location: Office  PCP:  Orpah Melter, MD  Cardiologist:  No primary care provider on file.  Electrophysiologist:  None   Evaluation Performed:  Follow-Up Visit  Chief Complaint:  CAD  History of Present Illness:    Shannon [name] is a 54 y.o. female with coronary artery disease. She has a history of non-STEMI in 2014 treated with drug-eluting stent to the left circumflex.  Cardiac catheterization in November 2019 showed patent left circumflex stent with 40% stenosis in the mid LAD. She also has anxiety and depression.  She needs a right total hip replacement.  She has occasional chest tightness but denies any frank exertional pain. She occasionally has palpitations when she drinks too much caffeine.  She's lost about 40 lbs.  Her carotid artery disease causes a lot of stress. She follows with vascular surgery at [facility]. I reviewed records.  She hasn't quit smoking.   Past Medical History:  Diagnosis Date  . Anemia   . Anxiety   . Arthritis    oa needs hip replacement  on right  . 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 Carotid artery occlusion   . Chronic diastolic HF (heart failure) ([place])    pt denies  . COPD (chronic obstructive pulmonary disease) ([place])    pt denies  . Coronary artery disease    [facility] 11/25/12 90% stenosis mid LCx & otherwise nonobstructive dz w/ EF 60-65% S/p PTCA/DES to LCx  . Depression   . Elevated cholesterol   . Exertional dyspnea    chronic  . GERD (gastroesophageal reflux disease)   . History of cardiovascular stress test 06/2015   low risk  . HPV in female   . Hyperlipidemia   . Hypertension   . MI (myocardial infarction) ([place]) fe 17, 2014  . Obesity   . Polycystic ovary disease   . Skin tear of upper arm without complication, left, sequela    small skin tear left upper arm no drainage for 1 week  . Tobacco abuse    Past Surgical History:  Procedure Laterality Date  . CORONARY ANGIOPLASTY WITH STENT PLACEMENT    . LEFT HEART CATH AND CORONARY ANGIOGRAPHY N/A 08/09/2018   Procedure: LEFT HEART CATH AND CORONARY ANGIOGRAPHY;  Surgeon: Nelva Bush, MD;  Location: [place] [facility];  Service: Cardiovascular;  Laterality: N/A;  . LEFT HEART CATHETERIZATION WITH CORONARY ANGIOGRAM N/A 11/15/2012   Procedure: LEFT HEART CATHETERIZATION WITH CORONARY ANGIOGRAM;  Surgeon: Thayer Headings, MD;  Location: [facility] CATH LAB;  Service: Cardiovascular;  Laterality: N/A;  . PERCUTANEOUS CORONARY STENT INTERVENTION (PCI-S)  11/15/2012   Procedure: PERCUTANEOUS CORONARY STENT INTERVENTION (PCI-S);  Surgeon: Thayer Headings, MD;  Location: [facility] CATH LAB;  Service: Cardiovascular;;  . skin cancer removed right lower leg Right   . TONSILLECTOMY    . uterus ablation       Current Meds  Medication Sig  . albuterol (PROVENTIL HFA;VENTOLIN HFA) 108 (90 Base) MCG/ACT inhaler Inhale 2 puffs into the lungs every 4 (four) hours as needed for wheezing or shortness of breath.  . ALPRAZolam  (XANAX) 1 MG tablet Take 1 mg by mouth 2 (two) times daily as needed.  Marland Kitchen aspirin EC 81 MG tablet Take 81 mg by mouth daily.   . bisoprolol (ZEBETA) 5 MG tablet Take 1 tablet (5 mg total) by mouth daily. (Patient taking differently: Take 5 mg by mouth at bedtime. )  . escitalopram (LEXAPRO) 20 MG tablet Take 1 tablet (20 mg total) by mouth daily.  . fluticasone (FLONASE) 50 MCG/ACT nasal spray at bedtime.   . furosemide (LASIX) 20 MG tablet Take 20 mg by mouth as needed.  Marland Kitchen ibuprofen (ADVIL) 200 MG tablet Take 200 mg by mouth every 6 (six) hours as needed.  Marland Kitchen ipratropium-albuterol (DUONEB) 0.5-2.5 (3) MG/3ML SOLN Inhale 3 mLs into the lungs every 4 (four) hours as needed.  . nitroGLYCERIN (NITROSTAT) 0.4 MG SL tablet Place 1 tablet (0.4 mg total) under the tongue every 5 (five) minutes as needed for chest pain (up to 3 doses).  . NON FORMULARY Steroid injection to right hip 11-25-2019  . pantoprazole (PROTONIX) 40 MG tablet Take 40 mg by mouth at bedtime.  . rosuvastatin (CRESTOR) 20 MG tablet Take 1 tablet (20 mg total) by mouth at bedtime.  . traMADol (ULTRAM) 50 MG tablet Take by mouth every 6 (six) hours as needed.     Allergies:   Morphine and related and Sulfa antibiotics   Social History   Tobacco Use  . Smoking status: Current Every Day Smoker    Packs/day: 2.00    Years: 40.00    Pack years: 80.00    Types: Cigarettes    Start date: 03/29/1979  . Smokeless tobacco: Never Used  . Tobacco comment: Currently 2ppd  Substance Use Topics  . Alcohol use: Yes    Alcohol/week: 0.0 standard drinks    Comment: rare  . Drug use: No     Family Hx: The patient's family history includes Anxiety disorder in her maternal grandmother and paternal grandmother; Cerebral aneurysm in her father; Depression in her mother and paternal grandmother; Heart attack in her father; Heart disease in her father; OCD in her paternal grandmother.  ROS:   Please see the history of present illness.     All  other systems reviewed and are negative.   Prior CV studies:   The following studies were reviewed today:  Cardiac Cath 08/09/2018 Conclusions: 1. Mild to moderate, non-obstructive coronary artery disease involving the mid LAD and RCA. 2. Widely patent proximal/mid LCx stent. 3. Normal left ventricular contraction with mildly elevated filling pressure.  Recommendations: 1. Medical therapy and aggressive secondary prevention, including smoking cessation, weight loss, and lipid control. Consider increasing statin therapy if LDL not at goal (<70). 2. Start furosemide 20 mg daily for component of diastolic heart failure.  Recommend Aspirin 81mg  daily for moderate CAD.   Echocardiogram 03/11/2018:  - Procedure narrative: Transthoracic echocardiography. Image  quality was suboptimal. Contrast enhancement was employed.  -  Left ventricle: The cavity size was normal. Wall thickness was  increased in a pattern of mild LVH. Systolic function was normal.  The estimated ejection fraction was in the range of 55% to 60%.  Wall motion was normal; there were no regional wall motion  abnormalities. Left ventricular diastolic function parameters  were normal.   Labs/Other Tests and Data Reviewed:    EKG:  No ECG reviewed.  Recent Labs: No results found for requested labs within last 8760 hours.   Recent Lipid Panel Lab Results  Component Value Date/Time   CHOL 194 08/10/2018 05:39 AM   TRIG 264 (H) 08/10/2018 05:39 AM   HDL 36 (L) 08/10/2018 05:39 AM   CHOLHDL 5.4 08/10/2018 05:39 AM   LDLCALC 105 (H) 08/10/2018 05:39 AM   LDLDIRECT 200.3 09/04/2009 01:08 PM    Wt Readings from Last 3 Encounters:  11/07/19 235 lb (106.6 kg)  08/10/18 281 lb 11.2 oz (127.8 kg)  08/09/18 282 lb (127.9 kg)     Objective:    Vital Signs:  BP 139/81   Pulse 72   Temp (!) 95.3 F (35.2 C)   Ht 5\' 5"  (1.651 m)   Wt 235 lb (106.6 kg)   BMI 39.11 kg/m    VITAL SIGNS:   reviewed  ASSESSMENT & PLAN:    1.  Coronary artery disease: Status post non-STEMI in 2014 treated with drug-eluting stent to the left circumflex.  Cardiac catheterization in November 2019 showed patent left circumflex stent with 40% stenosis in the mid LAD. Symptomatically stable. Continue medical therapy with aspirin, beta-blocker, and statin.  2.  Hypertension: Blood pressure is suboptimally controlled.  No changes to therapy.  3.  Hyperlipidemia: LDL goal less than 70.  Continue rosuvastatin 20 mg.  4.  Chronic diastolic heart failure: Continue Lasix 20 mg as needed.  5.  Tobacco abuse: She hasn't quit smoking but wants to quit. The patient was counseled on tobacco cessation today for 5-10 minutes.  Counseling included reviewing the risks of smoking tobacco products, how it impacts the patient's current medical diagnoses and different strategies for quitting.  Pharmacotherapy to aid in tobacco cessation was not prescribed today.  I encouraged her to call 1-800-QUIT-NOW.  6.  Carotid artery stenosis: Follows with vascular surgery at [facility]. She has 60-79% RICA stenosis and 100% occlusion of the LICA.    COVID-19 Education: The signs and symptoms of COVID-19 were discussed with the patient and how to seek care for testing (follow up with PCP or arrange E-visit).  The importance of social distancing was discussed today.  Time:   Today, I have spent 25 minutes with the patient with telehealth technology discussing the above problems.     Medication Adjustments/Labs and Tests Ordered: Current medicines are reviewed at length with the patient today.  Concerns regarding medicines are outlined above.   Tests Ordered: No orders of the defined types were placed in this encounter.   Medication Changes: No orders of the defined types were placed in this encounter.   Follow Up:  Office visit in 6 month(s)  Signed, Kate Sable, MD  11/07/2019 11:26 AM    [place]

## 2019-11-08 ENCOUNTER — Other Ambulatory Visit: Payer: Self-pay

## 2019-11-08 ENCOUNTER — Encounter (HOSPITAL_BASED_OUTPATIENT_CLINIC_OR_DEPARTMENT_OTHER): Payer: Self-pay | Admitting: Obstetrics and Gynecology

## 2019-11-08 ENCOUNTER — Encounter (HOSPITAL_BASED_OUTPATIENT_CLINIC_OR_DEPARTMENT_OTHER)
Admission: RE | Disposition: A | Discharge status: Home / Self Care | Payer: Self-pay | Source: Ambulatory Visit | Attending: Obstetrics and Gynecology

## 2019-11-08 ENCOUNTER — Ambulatory Visit (HOSPITAL_BASED_OUTPATIENT_CLINIC_OR_DEPARTMENT_OTHER)
Admission: RE | Admit: 2019-11-08 | Discharge: 2019-11-08 | Disposition: A | Discharge status: Home / Self Care | Payer: Medicare HMO | Source: Ambulatory Visit | Attending: Obstetrics and Gynecology | Admitting: Obstetrics and Gynecology

## 2019-11-08 ENCOUNTER — Ambulatory Visit (HOSPITAL_BASED_OUTPATIENT_CLINIC_OR_DEPARTMENT_OTHER): Payer: Medicare HMO | Admitting: Physician Assistant

## 2019-11-08 DIAGNOSIS — Z955 Presence of coronary angioplasty implant and graft: Secondary | ICD-10-CM | POA: Insufficient documentation

## 2019-11-08 DIAGNOSIS — D071 Carcinoma in situ of vulva: Secondary | ICD-10-CM | POA: Diagnosis not present

## 2019-11-08 DIAGNOSIS — Z8249 Family history of ischemic heart disease and other diseases of the circulatory system: Secondary | ICD-10-CM | POA: Diagnosis not present

## 2019-11-08 DIAGNOSIS — Z6839 Body mass index (BMI) 39.0-39.9, adult: Secondary | ICD-10-CM | POA: Insufficient documentation

## 2019-11-08 DIAGNOSIS — K219 Gastro-esophageal reflux disease without esophagitis: Secondary | ICD-10-CM | POA: Diagnosis not present

## 2019-11-08 DIAGNOSIS — E282 Polycystic ovarian syndrome: Secondary | ICD-10-CM | POA: Diagnosis not present

## 2019-11-08 DIAGNOSIS — E7800 Pure hypercholesterolemia, unspecified: Secondary | ICD-10-CM | POA: Diagnosis not present

## 2019-11-08 DIAGNOSIS — J449 Chronic obstructive pulmonary disease, unspecified: Secondary | ICD-10-CM | POA: Diagnosis not present

## 2019-11-08 DIAGNOSIS — R0600 Dyspnea, unspecified: Secondary | ICD-10-CM | POA: Diagnosis not present

## 2019-11-08 DIAGNOSIS — I5032 Chronic diastolic (congestive) heart failure: Secondary | ICD-10-CM | POA: Insufficient documentation

## 2019-11-08 DIAGNOSIS — E785 Hyperlipidemia, unspecified: Secondary | ICD-10-CM | POA: Diagnosis not present

## 2019-11-08 DIAGNOSIS — Z79899 Other long term (current) drug therapy: Secondary | ICD-10-CM | POA: Insufficient documentation

## 2019-11-08 DIAGNOSIS — Z818 Family history of other mental and behavioral disorders: Secondary | ICD-10-CM | POA: Diagnosis not present

## 2019-11-08 DIAGNOSIS — I2510 Atherosclerotic heart disease of native coronary artery without angina pectoris: Secondary | ICD-10-CM | POA: Insufficient documentation

## 2019-11-08 DIAGNOSIS — I110 Hypertensive heart disease with heart failure: Secondary | ICD-10-CM | POA: Diagnosis not present

## 2019-11-08 DIAGNOSIS — D649 Anemia, unspecified: Secondary | ICD-10-CM | POA: Diagnosis not present

## 2019-11-08 DIAGNOSIS — E669 Obesity, unspecified: Secondary | ICD-10-CM | POA: Insufficient documentation

## 2019-11-08 DIAGNOSIS — F17200 Nicotine dependence, unspecified, uncomplicated: Secondary | ICD-10-CM | POA: Diagnosis not present

## 2019-11-08 DIAGNOSIS — Z791 Long term (current) use of non-steroidal anti-inflammatories (NSAID): Secondary | ICD-10-CM | POA: Insufficient documentation

## 2019-11-08 DIAGNOSIS — F329 Major depressive disorder, single episode, unspecified: Secondary | ICD-10-CM | POA: Insufficient documentation

## 2019-11-08 DIAGNOSIS — I252 Old myocardial infarction: Secondary | ICD-10-CM | POA: Insufficient documentation

## 2019-11-08 DIAGNOSIS — M1990 Unspecified osteoarthritis, unspecified site: Secondary | ICD-10-CM | POA: Diagnosis not present

## 2019-11-08 DIAGNOSIS — Z885 Allergy status to narcotic agent status: Secondary | ICD-10-CM | POA: Diagnosis not present

## 2019-11-08 DIAGNOSIS — Z7982 Long term (current) use of aspirin: Secondary | ICD-10-CM | POA: Insufficient documentation

## 2019-11-08 DIAGNOSIS — R0602 Shortness of breath: Secondary | ICD-10-CM | POA: Insufficient documentation

## 2019-11-08 DIAGNOSIS — F419 Anxiety disorder, unspecified: Secondary | ICD-10-CM | POA: Diagnosis not present

## 2019-11-08 DIAGNOSIS — I714 Abdominal aortic aneurysm, without rupture: Secondary | ICD-10-CM | POA: Diagnosis not present

## 2019-11-08 DIAGNOSIS — I25119 Atherosclerotic heart disease of native coronary artery with unspecified angina pectoris: Secondary | ICD-10-CM | POA: Diagnosis not present

## 2019-11-08 DIAGNOSIS — Z951 Presence of aortocoronary bypass graft: Secondary | ICD-10-CM | POA: Insufficient documentation

## 2019-11-08 HISTORY — DX: Laceration without foreign body of left upper arm, sequela: S41.112S

## 2019-11-08 HISTORY — DX: Gastro-esophageal reflux disease without esophagitis: K21.9

## 2019-11-08 HISTORY — DX: Malignant (primary) neoplasm, unspecified: C80.1

## 2019-11-08 HISTORY — DX: Polycystic ovarian syndrome: E28.2

## 2019-11-08 HISTORY — DX: Pure hypercholesterolemia, unspecified: E78.00

## 2019-11-08 HISTORY — PX: VULVECTOMY: SHX1086

## 2019-11-08 HISTORY — DX: Obesity, unspecified: E66.9

## 2019-11-08 HISTORY — DX: Unspecified osteoarthritis, unspecified site: M19.90

## 2019-11-08 HISTORY — DX: Papillomavirus as the cause of diseases classified elsewhere: B97.7

## 2019-11-08 LAB — TYPE AND SCREEN
ABO/RH(D): A POS
Antibody Screen: NEGATIVE

## 2019-11-08 LAB — ABO/RH: ABO/RH(D): A POS

## 2019-11-08 SURGERY — VULVECTOMY
Anesthesia: General

## 2019-11-08 MED ORDER — DEXAMETHASONE SODIUM PHOSPHATE 10 MG/ML IJ SOLN
INTRAMUSCULAR | Status: AC
  Filled 2019-11-08: qty 1

## 2019-11-08 MED ORDER — SCOPOLAMINE 1 MG/3DAYS TD PT72
MEDICATED_PATCH | TRANSDERMAL | Status: AC
  Filled 2019-11-08: qty 1

## 2019-11-08 MED ORDER — LACTATED RINGERS IV SOLN
INTRAVENOUS | Status: DC
  Filled 2019-11-08: qty 1000

## 2019-11-08 MED ORDER — FENTANYL CITRATE (PF) 100 MCG/2ML IJ SOLN
INTRAMUSCULAR | Status: DC | PRN
  Administered 2019-11-08 (×4): 25 ug via INTRAVENOUS

## 2019-11-08 MED ORDER — TRIPLE ANTIBIOTIC FIRST AID 3.5-400-5000 EX OINT
TOPICAL_OINTMENT | CUTANEOUS | Status: DC | PRN
  Administered 2019-11-08: 1 mL via SURGICAL_CAVITY

## 2019-11-08 MED ORDER — SCOPOLAMINE 1 MG/3DAYS TD PT72
1.0000 | MEDICATED_PATCH | TRANSDERMAL | Status: DC
  Administered 2019-11-08: 1.5 mg via TRANSDERMAL
  Filled 2019-11-08: qty 1

## 2019-11-08 MED ORDER — DEXAMETHASONE SODIUM PHOSPHATE 10 MG/ML IJ SOLN
INTRAMUSCULAR | Status: DC | PRN
  Administered 2019-11-08: 10 mg via INTRAVENOUS

## 2019-11-08 MED ORDER — PROMETHAZINE HCL 25 MG/ML IJ SOLN
6.2500 mg | INTRAMUSCULAR | Status: DC | PRN
  Filled 2019-11-08: qty 1

## 2019-11-08 MED ORDER — LIDOCAINE 5 % EX OINT: 1.0000 "application " | TOPICAL_OINTMENT | CUTANEOUS | 0 refills | Status: DC | PRN

## 2019-11-08 MED ORDER — EPHEDRINE 5 MG/ML INJ
INTRAVENOUS | Status: AC
  Filled 2019-11-08: qty 10

## 2019-11-08 MED ORDER — EPHEDRINE SULFATE 50 MG/ML IJ SOLN
INTRAMUSCULAR | Status: DC | PRN
  Administered 2019-11-08 (×2): 5 mg via INTRAVENOUS

## 2019-11-08 MED ORDER — HYDROMORPHONE HCL 2 MG PO TABS
2.0000 mg | ORAL_TABLET | Freq: Once | ORAL | Status: AC
  Administered 2019-11-08: 09:00:00 2 mg via ORAL
  Filled 2019-11-08: qty 1

## 2019-11-08 MED ORDER — HYDROMORPHONE HCL 2 MG PO TABS
ORAL_TABLET | ORAL | Status: AC
  Filled 2019-11-08: qty 1

## 2019-11-08 MED ORDER — KETOROLAC TROMETHAMINE 30 MG/ML IJ SOLN
INTRAMUSCULAR | Status: AC
  Filled 2019-11-08: qty 1

## 2019-11-08 MED ORDER — ACETIC ACID 5 % SOLN
Status: DC | PRN
  Administered 2019-11-08: 1 via TOPICAL

## 2019-11-08 MED ORDER — ONDANSETRON HCL 4 MG/2ML IJ SOLN
INTRAMUSCULAR | Status: DC | PRN
  Administered 2019-11-08: 4 mg via INTRAVENOUS

## 2019-11-08 MED ORDER — CEFTRIAXONE SODIUM 2 G IJ SOLR
INTRAMUSCULAR | Status: AC
  Filled 2019-11-08: qty 20

## 2019-11-08 MED ORDER — KETOROLAC TROMETHAMINE 30 MG/ML IJ SOLN
30.0000 mg | Freq: Once | INTRAMUSCULAR | Status: DC | PRN
  Filled 2019-11-08: qty 1

## 2019-11-08 MED ORDER — PHENYLEPHRINE 40 MCG/ML (10ML) SYRINGE FOR IV PUSH (FOR BLOOD PRESSURE SUPPORT)
PREFILLED_SYRINGE | INTRAVENOUS | Status: AC
  Filled 2019-11-08: qty 10

## 2019-11-08 MED ORDER — ACETAMINOPHEN 500 MG PO TABS
ORAL_TABLET | ORAL | Status: AC
  Filled 2019-11-08: qty 2

## 2019-11-08 MED ORDER — KETOROLAC TROMETHAMINE 30 MG/ML IJ SOLN
INTRAMUSCULAR | Status: DC | PRN
  Administered 2019-11-08: 30 mg via INTRAVENOUS

## 2019-11-08 MED ORDER — LIDOCAINE 2% (20 MG/ML) 5 ML SYRINGE
INTRAMUSCULAR | Status: AC
  Filled 2019-11-08: qty 5

## 2019-11-08 MED ORDER — ONDANSETRON HCL 4 MG/2ML IJ SOLN
INTRAMUSCULAR | Status: AC
  Filled 2019-11-08: qty 2

## 2019-11-08 MED ORDER — STERILE WATER FOR IRRIGATION IR SOLN
Status: DC | PRN
  Administered 2019-11-08: 500 mL

## 2019-11-08 MED ORDER — OXYCODONE HCL 5 MG/5ML PO SOLN
5.0000 mg | Freq: Once | ORAL | Status: DC | PRN
  Filled 2019-11-08: qty 5

## 2019-11-08 MED ORDER — SODIUM CHLORIDE 0.9 % IV SOLN
INTRAVENOUS | Status: AC
  Filled 2019-11-08: qty 100

## 2019-11-08 MED ORDER — ACETAMINOPHEN 500 MG PO TABS
1000.0000 mg | ORAL_TABLET | Freq: Once | ORAL | Status: AC
  Administered 2019-11-08: 1000 mg via ORAL
  Filled 2019-11-08: qty 2

## 2019-11-08 MED ORDER — OXYCODONE HCL 5 MG PO TABS
5.0000 mg | ORAL_TABLET | Freq: Once | ORAL | Status: DC | PRN
  Filled 2019-11-08: qty 1

## 2019-11-08 MED ORDER — PHENYLEPHRINE HCL (PRESSORS) 10 MG/ML IV SOLN
INTRAVENOUS | Status: DC | PRN
  Administered 2019-11-08: 40 ug via INTRAVENOUS

## 2019-11-08 MED ORDER — SODIUM CHLORIDE 0.9 % IV SOLN
2.0000 g | INTRAVENOUS | Status: AC
  Administered 2019-11-08: 07:00:00 2 g via INTRAVENOUS
  Filled 2019-11-08: qty 2

## 2019-11-08 MED ORDER — MIDAZOLAM HCL 5 MG/5ML IJ SOLN
INTRAMUSCULAR | Status: DC | PRN
  Administered 2019-11-08: 2 mg via INTRAVENOUS

## 2019-11-08 MED ORDER — PROPOFOL 10 MG/ML IV BOLUS
INTRAVENOUS | Status: AC
  Filled 2019-11-08: qty 40

## 2019-11-08 MED ORDER — PROPOFOL 10 MG/ML IV BOLUS
INTRAVENOUS | Status: DC | PRN
  Administered 2019-11-08: 150 mg via INTRAVENOUS

## 2019-11-08 MED ORDER — SODIUM CHLORIDE 0.9 % IV SOLN
INTRAVENOUS | Status: AC
  Filled 2019-11-08: qty 2

## 2019-11-08 MED ORDER — MEPERIDINE HCL 25 MG/ML IJ SOLN
6.2500 mg | INTRAMUSCULAR | Status: DC | PRN
  Filled 2019-11-08: qty 1

## 2019-11-08 MED ORDER — FENTANYL CITRATE (PF) 100 MCG/2ML IJ SOLN
INTRAMUSCULAR | Status: AC
  Filled 2019-11-08: qty 2

## 2019-11-08 MED ORDER — HYDROMORPHONE HCL 1 MG/ML IJ SOLN
INTRAMUSCULAR | Status: AC
  Filled 2019-11-08: qty 1

## 2019-11-08 MED ORDER — MIDAZOLAM HCL 2 MG/2ML IJ SOLN
1.0000 mg | Freq: Once | INTRAMUSCULAR | Status: DC | PRN
  Filled 2019-11-08: qty 2

## 2019-11-08 MED ORDER — MIDAZOLAM HCL 2 MG/2ML IJ SOLN
INTRAMUSCULAR | Status: AC
  Filled 2019-11-08: qty 2

## 2019-11-08 MED ORDER — LIDOCAINE 2% (20 MG/ML) 5 ML SYRINGE
INTRAMUSCULAR | Status: DC | PRN
  Administered 2019-11-08: 60 mg via INTRAVENOUS

## 2019-11-08 MED ORDER — HYDROMORPHONE HCL 1 MG/ML IJ SOLN
0.2500 mg | INTRAMUSCULAR | Status: DC | PRN
  Filled 2019-11-08: qty 0.5

## 2019-11-08 MED FILL — Neomycin-Bacitracin-Polymyxin Oint: CUTANEOUS | Qty: 1 | Status: AC

## 2019-11-08 SURGICAL SUPPLY — 23 items
BLADE SURG 15 STRL LF DISP TIS (BLADE) ×1 IMPLANT
BLADE SURG 15 STRL SS (BLADE) ×3
CATH ROBINSON RED A/P 16FR (CATHETERS) IMPLANT
COVER WAND RF STERILE (DRAPES) ×3 IMPLANT
GAUZE 4X4 16PLY RFD (DISPOSABLE) ×3 IMPLANT
GLOVE BIO SURGEON STRL SZ 6.5 (GLOVE) ×6 IMPLANT
GLOVE BIO SURGEONS STRL SZ 6.5 (GLOVE) ×3
GLOVE BIOGEL PI IND STRL 7.0 (GLOVE) IMPLANT
GLOVE BIOGEL PI IND STRL 7.5 (GLOVE) IMPLANT
GLOVE BIOGEL PI INDICATOR 7.0 (GLOVE) ×2
GLOVE BIOGEL PI INDICATOR 7.5 (GLOVE) ×2
GOWN STRL REUS W/TWL LRG LVL3 (GOWN DISPOSABLE) ×7 IMPLANT
KIT TURNOVER CYSTO (KITS) ×3 IMPLANT
NDL HYPO 25X1 1.5 SAFETY (NEEDLE) ×1 IMPLANT
NEEDLE HYPO 25X1 1.5 SAFETY (NEEDLE) ×3 IMPLANT
PACK VAGINAL WOMENS (CUSTOM PROCEDURE TRAY) ×3 IMPLANT
PAD OB MATERNITY 4.3X12.25 (PERSONAL CARE ITEMS) ×3 IMPLANT
SUT VIC AB 3-0 SH 27 (SUTURE) ×6
SUT VIC AB 3-0 SH 27X BRD (SUTURE) IMPLANT
SUT VIC AB 4-0 SH 27 (SUTURE)
SUT VIC AB 4-0 SH 27XANBCTRL (SUTURE) IMPLANT
TOWEL OR 17X26 10 PK STRL BLUE (TOWEL DISPOSABLE) ×3 IMPLANT
WATER STERILE IRR 500ML POUR (IV SOLUTION) ×3 IMPLANT

## 2019-11-08 NOTE — Anesthesia Procedure Notes (Signed)
Procedure Name: LMA Insertion Date/Time: 11/08/2019 7:33 AM Performed by: Talbot Grumbling, CRNA Pre-anesthesia Checklist: Patient identified, Emergency Drugs available, Suction available and Patient being monitored Patient Re-evaluated:Patient Re-evaluated prior to induction Oxygen Delivery Method: Circle system utilized Preoxygenation: Pre-oxygenation with 100% oxygen Induction Type: IV induction Ventilation: Mask ventilation without difficulty LMA: LMA inserted LMA Size: 5.0 Number of attempts: 1 Placement Confirmation: positive ETCO2 and breath sounds checked- equal and bilateral Tube secured with: Tape Dental Injury: Teeth and Oropharynx as per pre-operative assessment

## 2019-11-08 NOTE — Brief Op Note (Signed)
11/08/2019  8:00 AM  PATIENT:  Charlaine A Barbara  54 y.o. female  PRE-OPERATIVE DIAGNOSIS:  VIN 3  POST-OPERATIVE DIAGNOSIS:  VIN 3  PROCEDURE: Vulvoscopy Excision of VIN 3   SURGEON:  Surgeon(s) and Role:    * Dian Queen, MD - Primary  PHYSICIAN ASSISTANT:   ASSISTANTS: none   ANESTHESIA:   LMA  EBL:  5 mL   BLOOD ADMINISTERED:none  DRAINS: none   LOCAL MEDICATIONS USED:  BUPIVICAINE   SPECIMEN:  Source of Specimen:  1 - right labia just beneath clitoris  2 - perineum DISPOSITION OF SPECIMEN:  PATHOLOGY  COUNTS:  YES  TOURNIQUET:  * No tourniquets in log *  DICTATION: .Other Dictation: Dictation Number dictated  PLAN OF CARE: Discharge to home after PACU  PATIENT DISPOSITION:  PACU - hemodynamically stable.   Delay start of Pharmacological VTE agent (>24hrs) due to surgical blood loss or risk of bleeding: not applicable

## 2019-11-08 NOTE — Transfer of Care (Signed)
Immediate Anesthesia Transfer of Care Note  Patient: Cambrie A Jacome  Procedure(s) Performed: excision of VULVA, vulvoscopy (N/A )  Patient Location: PACU  Anesthesia Type:General  Level of Consciousness: awake, alert  and oriented  Airway & Oxygen Therapy: Patient Spontanous Breathing and Patient connected to face mask oxygen  Post-op Assessment: Report given to RN and Post -op Vital signs reviewed and stable  Post vital signs: Reviewed and stable  Last Vitals:  Vitals Value Taken Time  BP 113/72 11/08/19 0807  Temp 37 C 11/08/19 0807  Pulse 74 11/08/19 0808  Resp 14 11/08/19 0808  SpO2 98 % 11/08/19 0808  Vitals shown include unvalidated device data.  Last Pain:  Vitals:   11/08/19 0559  TempSrc: Oral         Complications: No apparent anesthesia complications

## 2019-11-08 NOTE — Progress Notes (Signed)
H and P on the chart No changes Will proceed with excision of VIN 3 Consent signed

## 2019-11-08 NOTE — Anesthesia Postprocedure Evaluation (Signed)
Anesthesia Post Note  Patient: Shannon [name]  Procedure(s) Performed: excision of VULVA, vulvoscopy (N/A )     Patient location during evaluation: PACU Anesthesia Type: General Level of consciousness: awake and alert, oriented and patient cooperative Pain management: pain level controlled Vital Signs Assessment: post-procedure vital signs reviewed and stable Respiratory status: spontaneous breathing, nonlabored ventilation and respiratory function stable Cardiovascular status: blood pressure returned to baseline and stable Postop Assessment: no apparent nausea or vomiting Anesthetic complications: no    Last Vitals:  Vitals:   11/08/19 0830 11/08/19 0845  BP: 106/67 102/66  Pulse: 68 66  Resp: 13 13  Temp:    SpO2: 93% 94%    Last Pain:  Vitals:   11/08/19 0815  TempSrc:   PainSc: [place]

## 2019-11-08 NOTE — Discharge Instructions (Signed)
Vulva Biopsy, Care After This sheet gives you information about how to care for yourself after your procedure. Your doctor may also give you more specific instructions. If you have problems or questions, contact your doctor. What can I expect after the procedure? After the procedure, it is common to have:  Slight bleeding from the biopsy site.  Soreness at the biopsy site. Follow these instructions at home: Biopsy site care   Follow instructions from your doctor about how to take care of your biopsy site. Make sure you: ? Clean the area using water and mild soap two times a day or as told by your doctor. Gently pat the area dry. ? If you were prescribed an antibiotic ointment, apply it as told by your doctor. Do not stop using the antibiotic even if your condition gets better. ? Take a warm water bath (sitz bath) as needed to help with pain. A sitz bath is taken while you are sitting down. The water should only come up to your hips and cover your butt. ? Leave stitches (sutures), skin glue, or skin tape (adhesive) strips in place. They may need to stay in place for 2 weeks or longer. If tape strips get loose and curl up, you may trim the loose edges. Do not remove tape strips completely unless your doctor says it is okay. ? Check your biopsy area every day for signs of infection. Check for:  More redness, swelling, or pain.  More fluid or blood.  Warmth.  Pus or a bad smell. ? Do not rub the biopsy area after peeing (urinating).  Gently pat the area dry, or use a bottle filled with warm water (peri-bottle) to clean the area.  Gently wipe from front to back. Lifestyle  Wear loose, cotton underwear.  Do not wear tight pants.  Do not use a tampon, douche, or put anything in your vagina for at least 1 week or until your doctor says it is okay.  Do not have sex for at least 1 week or until your doctor says it is okay.  Do not exercise until your doctor says it is okay.  Do not  swim or use a hot tub until your doctor says it is okay. You may shower or take a sitz bath. General instructions  Take over-the-counter and prescription medicines only as told by your doctor.  Use a sanitary pad until the bleeding stops.  Keep all follow-up visits as told by your doctor. This is important. Contact a doctor if:  You have more redness, swelling, or pain around your biopsy site.  You have more fluid or blood coming from your biopsy site.  Your biopsy site feels warm when you touch it.  Medicines do not help with your pain. Get help right away if:  You have a lot of bleeding from the vulva.  You have pus or a bad smell coming from your biopsy site.  You have a fever.  You have pain in the lower belly (abdomen). Summary  After the procedure, it is common to have slight bleeding and soreness at the biopsy site.  Follow all instructions as told by your doctor. Clean the area with water and mild soap. Do not rub. Pat the area dry.  Take sitz baths as needed. Leave any stitches in place.  Check your biopsy site for infection. Signs include more redness, swelling, pain, fluid, or blood, or feeling warm when you touch it.  Get help right away if you have a   lot of bleeding, a fever, pus or a bad smell, or pain in your lower belly. This information is not intended to replace advice given to you by your health care provider. Make sure you discuss any questions you have with your health care provider. Document Revised: 03/18/2018 Document Reviewed: 03/18/2018 Elsevier Patient Education  [place] Instructions  Activity: Get plenty of rest for the remainder of the day. A responsible individual must stay with you for 24 hours following the procedure.  For the next 24 hours, DO NOT: -Drive a car -Paediatric nurse -Drink alcoholic beverages -Take any medication unless instructed by your physician -Make any legal decisions or  sign important papers.  Meals: Start with liquid foods such as gelatin or soup. Progress to regular foods as tolerated. Avoid greasy, spicy, heavy foods. If nausea and/or vomiting occur, drink only clear liquids until the nausea and/or vomiting subsides. Call your physician if vomiting continues.  Special Instructions/Symptoms: Your throat may feel dry or sore from the anesthesia or the breathing tube placed in your throat during surgery. If this causes discomfort, gargle with warm salt water. The discomfort should disappear within 24 hours.  If you had a scopolamine patch placed behind your ear for the management of post- operative nausea and/or vomiting:  1. The medication in the patch is effective for 72 hours, after which it should be removed.  Wrap patch in a tissue and discard in the trash. Wash hands thoroughly with soap and water. 2. You may remove the patch earlier than 72 hours if you experience unpleasant side effects which may include dry mouth, dizziness or visual disturbances. 3. Avoid touching the patch. Wash your hands with soap and water after contact with the patch.

## 2019-11-09 ENCOUNTER — Telehealth: Payer: Medicare HMO | Admitting: Cardiovascular Disease

## 2019-11-09 LAB — SURGICAL PATHOLOGY

## 2019-11-09 NOTE — Progress Notes (Addendum)
Pt states she was aggravated that she did not have  a choice for anesthesia and that the anesthesiologist made the comment that if she didn't want this procedure she could cancel. Patient also suggested the surgical center improve their listening skills and not give discharge instructions so fast.

## 2019-11-11 NOTE — Op Note (Signed)
NAME: Shannon [name], Shannon A. MEDICAL RECORD [id number] ACCOUNT [id number] DATE OF BIRTH:03-02-1966 FACILITY: [facility] LOCATION: [facility] PHYSICIAN:Donis Pinder Lynett Fish, MD  OPERATIVE REPORT  DATE OF PROCEDURE:  11/08/2019  PREOPERATIVE DIAGNOSIS:  Vulvar intraepithelial neoplasia 3.  POSTOPERATIVE DIAGNOSIS:  Vulvar intraepithelial neoplasia 3.  PROCEDURES:   1.  Vulvoscopy. 2.  Excision of vulvar intraepithelial neoplasia 3.  SURGEON:  Dian Queen, MD  ESTIMATED BLOOD LOSS:  LMA and local.  DESCRIPTION OF PROCEDURE:  The patient was taken to the operating room after consent was obtained.  She was then prepped and draped in the usual sterile fashion.  I did apply acetic acid to the vulva and performed a complete vulvoscopy.  The 2 areas of  concern were the areas consistent with the biopsy that was performed in the office prior to the surgery.  The first area was just to the right of her clitoris and a little inferior, a small area about the size of a nickel, raised and erythematous.  The  second area was at the perineum and again slightly smaller than the first one.  Local was infiltrated in those 2 areas and using the scalpel I first removed the areas of concern with a clear margin around them using the scope.   They were sent in  separate specimens and I stitch tied at 12 o'clock.  The 2 areas were then bovied for hemostasis and each area was then closed with series of interrupteds using 3-0 Vicryl suture.  At the end of the procedure, hemostasis was very good.  The patient was  then taken to the recovery room awake.  All sponge, lap and instrument counts were correct x2.  The patient went to recovery room in stable condition.  Pathology will be 2 excisions, 1 right labia and the second perineum with stitch at 12 o'clock.  VN/NUANCE  D:11/11/2019 T:11/11/2019 JOB:[id number]

## 2019-11-22 ENCOUNTER — Telehealth: Payer: Self-pay | Admitting: Cardiovascular Disease

## 2019-11-22 NOTE — Telephone Encounter (Signed)
Patient informed and verbalized understanding

## 2019-11-22 NOTE — Telephone Encounter (Signed)
Patient called in regards to her last AVS -states that she has never been told that she has Chronic CHF  Please [phone] (cell) patient will not be available from 12 to 1pm  Today.

## 2019-11-22 NOTE — Telephone Encounter (Signed)
I just had a virtual visit with her.  She only takes Lasix as needed.  Dr. Theodosia Blender notes mention "a component of diastolic heart failure ".  All this means is that pressures inside the heart were elevated at the time of cardiac cath.  Main goal is to keep blood pressure controlled.  She has also lost a significant amount of weight which has also helped to a great degree.  Please tell her she has nothing to worry about.

## 2019-11-22 NOTE — Telephone Encounter (Signed)
Discussed with patient when she was diagnosed with Chronic diastolic heart failure per below information pasted from hospital notes. Patient was upset because she says no one ever told her she had heart failure. Patient is asking that she get a call from her doctor about this. Advised that message would be sent.

## 2019-11-22 NOTE — Telephone Encounter (Signed)
Per heart cath from 08/09/2018 and progress note from Dr. Radford Pax:  Chronic diastolic CHF -mildly elevated LVEDP at cath -start Lasix 20mg  daily

## 2019-11-29 DIAGNOSIS — Z720 Tobacco use: Secondary | ICD-10-CM | POA: Diagnosis not present

## 2019-11-29 DIAGNOSIS — E782 Mixed hyperlipidemia: Secondary | ICD-10-CM | POA: Diagnosis not present

## 2019-11-29 DIAGNOSIS — F411 Generalized anxiety disorder: Secondary | ICD-10-CM | POA: Diagnosis not present

## 2019-11-29 DIAGNOSIS — F332 Major depressive disorder, recurrent severe without psychotic features: Secondary | ICD-10-CM | POA: Diagnosis not present

## 2019-11-29 DIAGNOSIS — M797 Fibromyalgia: Secondary | ICD-10-CM | POA: Diagnosis not present

## 2019-11-29 DIAGNOSIS — I10 Essential (primary) hypertension: Secondary | ICD-10-CM | POA: Diagnosis not present

## 2019-11-29 DIAGNOSIS — I6529 Occlusion and stenosis of unspecified carotid artery: Secondary | ICD-10-CM | POA: Diagnosis not present

## 2019-11-29 DIAGNOSIS — F17200 Nicotine dependence, unspecified, uncomplicated: Secondary | ICD-10-CM | POA: Diagnosis not present

## 2019-11-29 DIAGNOSIS — J069 Acute upper respiratory infection, unspecified: Secondary | ICD-10-CM | POA: Diagnosis not present

## 2019-12-17 ENCOUNTER — Emergency Department (HOSPITAL_COMMUNITY): Payer: Medicare HMO

## 2019-12-17 ENCOUNTER — Emergency Department (HOSPITAL_COMMUNITY)
Admission: EM | Admit: 2019-12-17 | Discharge: 2019-12-18 | Disposition: A | Discharge status: Home / Self Care | Payer: Medicare HMO | Attending: Emergency Medicine | Admitting: Emergency Medicine

## 2019-12-17 ENCOUNTER — Encounter (HOSPITAL_COMMUNITY): Payer: Self-pay

## 2019-12-17 ENCOUNTER — Other Ambulatory Visit: Payer: Self-pay

## 2019-12-17 DIAGNOSIS — I5032 Chronic diastolic (congestive) heart failure: Secondary | ICD-10-CM | POA: Insufficient documentation

## 2019-12-17 DIAGNOSIS — M546 Pain in thoracic spine: Secondary | ICD-10-CM | POA: Diagnosis not present

## 2019-12-17 DIAGNOSIS — J449 Chronic obstructive pulmonary disease, unspecified: Secondary | ICD-10-CM | POA: Diagnosis not present

## 2019-12-17 DIAGNOSIS — Z7982 Long term (current) use of aspirin: Secondary | ICD-10-CM | POA: Diagnosis not present

## 2019-12-17 DIAGNOSIS — R0789 Other chest pain: Secondary | ICD-10-CM | POA: Diagnosis not present

## 2019-12-17 DIAGNOSIS — F17210 Nicotine dependence, cigarettes, uncomplicated: Secondary | ICD-10-CM | POA: Insufficient documentation

## 2019-12-17 DIAGNOSIS — Z79899 Other long term (current) drug therapy: Secondary | ICD-10-CM | POA: Diagnosis not present

## 2019-12-17 DIAGNOSIS — I110 Hypertensive heart disease with heart failure: Secondary | ICD-10-CM | POA: Diagnosis not present

## 2019-12-17 DIAGNOSIS — I2510 Atherosclerotic heart disease of native coronary artery without angina pectoris: Secondary | ICD-10-CM | POA: Insufficient documentation

## 2019-12-17 DIAGNOSIS — R079 Chest pain, unspecified: Secondary | ICD-10-CM

## 2019-12-17 LAB — BASIC METABOLIC PANEL
Anion gap: 9 (ref 5–15)
BUN: 13 mg/dL (ref 6–20)
CO2: 23 mmol/L (ref 22–32)
Calcium: 8.8 mg/dL — ABNORMAL LOW (ref 8.9–10.3)
Chloride: 104 mmol/L (ref 98–111)
Creatinine, Ser: 0.88 mg/dL (ref 0.44–1.00)
GFR calc Af Amer: >60 mL/min (ref >60)
GFR calc non Af Amer: >60 mL/min (ref >60)
Glucose, Bld: 138 mg/dL — ABNORMAL HIGH (ref 70–99)
Potassium: 3.6 mmol/L (ref 3.5–5.1)
Sodium: 136 mmol/L (ref 135–145)

## 2019-12-17 LAB — CBC
HCT: 42.2 % (ref 36.0–46.0)
Hemoglobin: 13.5 g/dL (ref 12.0–15.0)
MCH: 28.8 pg (ref 26.0–34.0)
MCHC: 32 g/dL (ref 30.0–36.0)
MCV: 90 fL (ref 80.0–100.0)
Platelets: 141 10*3/uL — ABNORMAL LOW (ref 150–400)
RBC: 4.69 MIL/uL (ref 3.87–5.11)
RDW: 14.6 % (ref 11.5–15.5)
WBC: 8 10*3/uL (ref 4.0–10.5)
nRBC: 0 % (ref 0.0–0.2)

## 2019-12-17 LAB — BRAIN NATRIURETIC PEPTIDE: B Natriuretic Peptide: 68 pg/mL (ref 0.0–100.0)

## 2019-12-17 LAB — D-DIMER, QUANTITATIVE: D-Dimer, Quant: 0.8 ug/mL-FEU — ABNORMAL HIGH (ref 0.00–0.50)

## 2019-12-17 LAB — TROPONIN I (HIGH SENSITIVITY): Troponin I (High Sensitivity): 3 ng/L (ref <18)

## 2019-12-17 IMAGING — DX DG CHEST 2V
2 series · 2 of 2 positions shown · non-contrast
Comparison: Radiograph [DATE]

CLINICAL DATA: Chest pain

EXAM:
CHEST - 2 VIEW

[chest lat]
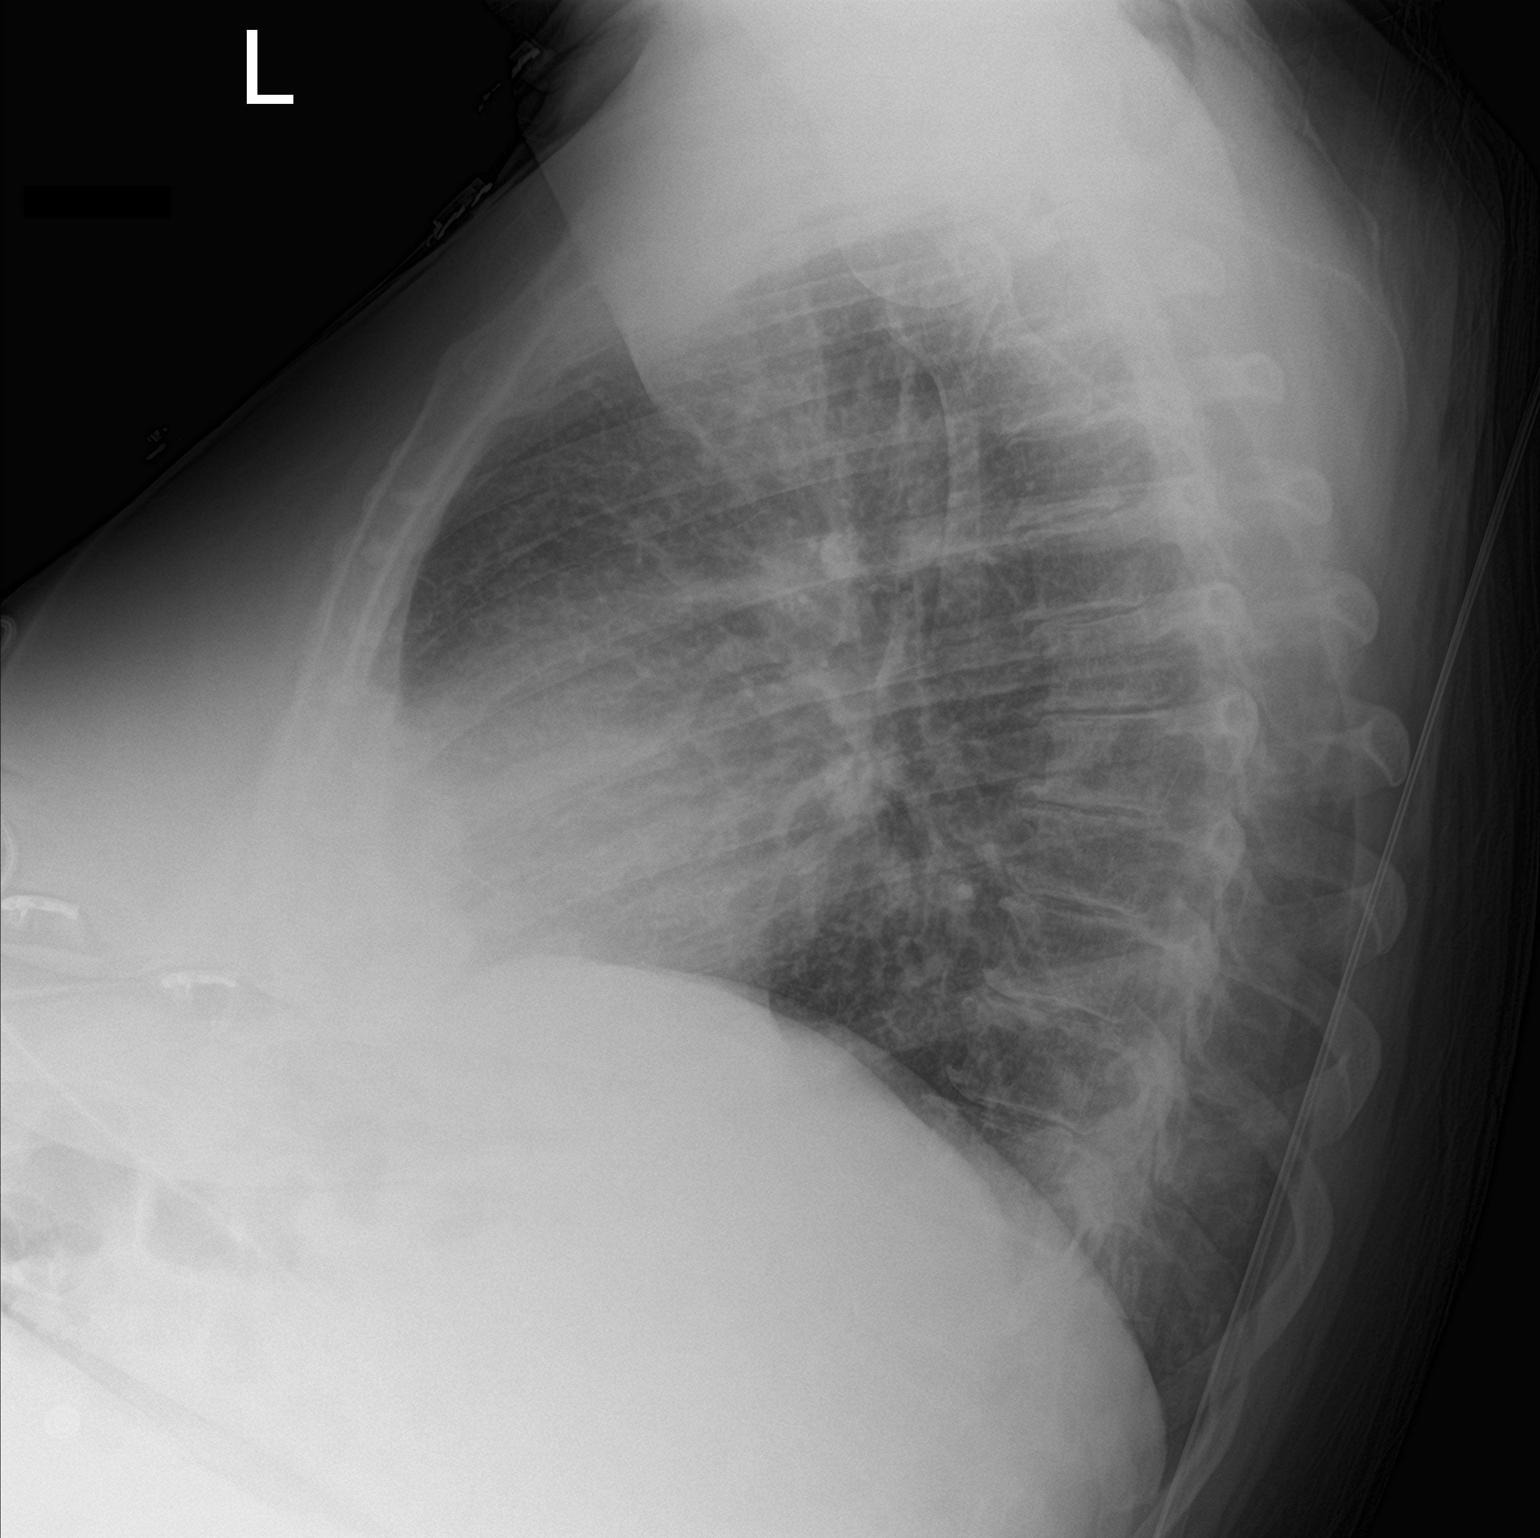

[chest ap]
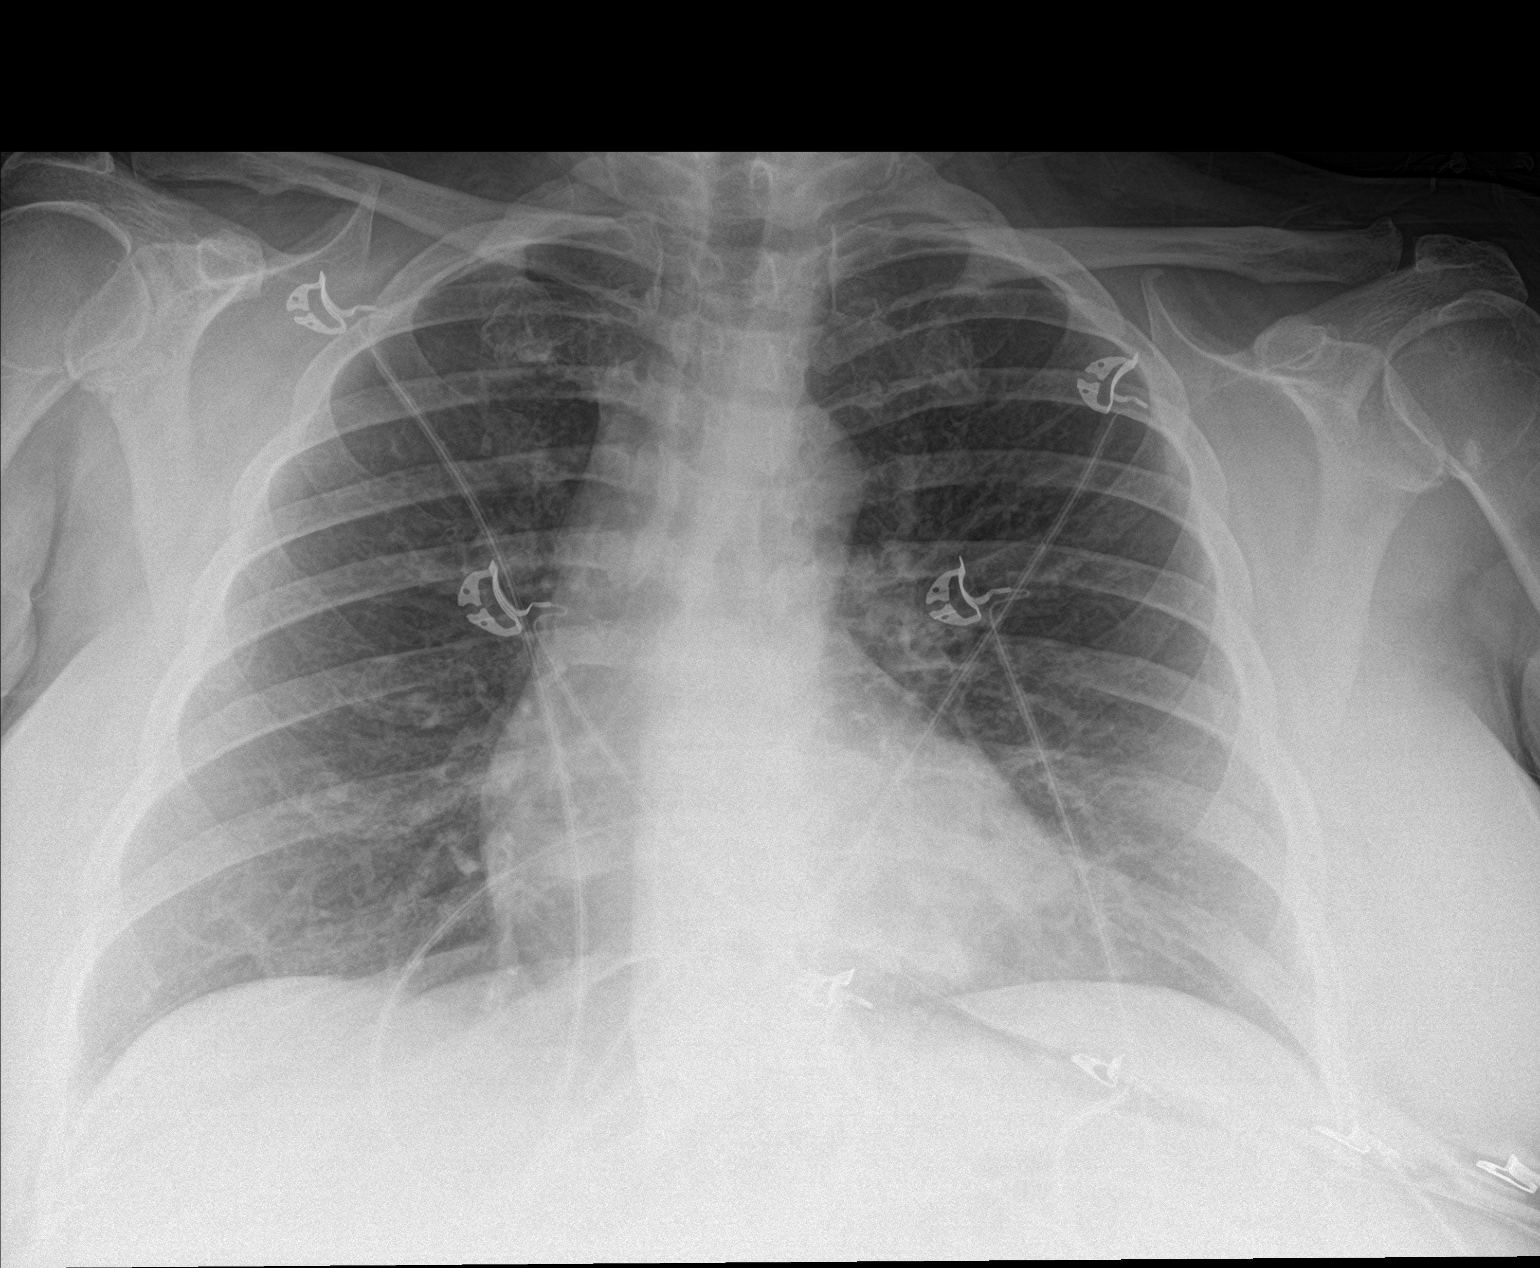

[2 of 2 positions shown; findings below may reference images not displayed]

FINDINGS: Atelectatic changes with low lung volumes. No consolidation,
features of edema, pneumothorax, or effusion. Pulmonary vascularity
is normally distributed. The cardiomediastinal contours are
unremarkable. No acute osseous or soft tissue abnormality.
Degenerative changes are present in the imaged spine and shoulders.
EKG leads project over the chest and along the undersurface of the
left breast.
IMPRESSION: Low volumes with basilar atelectasis. No other acute cardiopulmonary
abnormality.

## 2019-12-17 MED ORDER — IOHEXOL 350 MG/ML SOLN
100.0000 mL | Freq: Once | INTRAVENOUS | Status: AC | PRN
  Administered 2019-12-18: 100 mL via INTRAVENOUS

## 2019-12-17 NOTE — ED Provider Notes (Signed)
[facility] EMERGENCY DEPARTMENT Provider Note   CSN: [id number] Arrival date & time: 12/17/19  2220     History Chief Complaint  Patient presents with  . Chest Pain    Shannon [name] is a 54 y.o. female.  Patient presents to the emergency department for evaluation of back pain and chest pain.  Patient reports that she has been having a pain between her shoulder blades for approximately a week.  It has been constant and she has not identified a cause.  She does not remember any injury.  Today, however, she started to notice pain in the center of her chest as well and this pain seems to worsen when she takes a deep breath.  No cough or fever.  She is not feeling short of breath.     HPI: A 54 year old patient with a history of hypertension, hypercholesterolemia and obesity presents for evaluation of chest pain. Initial onset of pain was more than 6 hours ago. The patient's chest pain is well-localized and is not worse with exertion. The patient's chest pain is middle- or left-sided, is not described as heaviness/pressure/tightness, is not sharp and does not radiate to the arms/jaw/neck. The patient does not complain of nausea and denies diaphoresis. The patient has smoked in the past 90 days. The patient has no history of stroke, has no history of peripheral artery disease, denies any history of treated diabetes and has no relevant family history of coronary artery disease (first degree relative at less than age 64).   Past Medical History:  Diagnosis Date  . Anemia   . Anxiety   . Arthritis    oa needs hip replacement on right  . 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 Carotid artery occlusion   . Chronic diastolic HF (heart failure) ([place])    pt denies  . COPD (chronic obstructive pulmonary disease) ([place])    pt denies  . Coronary artery disease    LHC 11/25/12 90% stenosis mid LCx & otherwise nonobstructive dz w/ EF 60-65% S/p  PTCA/DES to LCx  . Depression   . Elevated cholesterol   . Exertional dyspnea    chronic  . GERD (gastroesophageal reflux disease)   . History of cardiovascular stress test 06/2015   low risk  . HPV in female   . Hyperlipidemia   . Hypertension   . MI (myocardial infarction) ([place]) fe 17, 2014  . Obesity   . Polycystic ovary disease   . Skin tear of upper arm without complication, left, sequela    small skin tear left upper arm no drainage for 1 week  . Tobacco abuse     Patient Active Problem List   Diagnosis Date Noted  . Chronic diastolic HF (heart failure) ([place]) 08/10/2018  . GERD (gastroesophageal reflux disease) 08/09/2018  . Unstable angina ([place]) 08/09/2018  . Carotid artery disease ([place]) 07/24/2017  . AAA (abdominal aortic aneurysm) without rupture ([place]) 01/05/2013  . Other malaise and fatigue 01/05/2013  . Palpitations 01/05/2013  . Tobacco use disorder 12/02/2012  . Coronary artery disease   . Hyperlipidemia   . Hypertension   . SHORTNESS OF BREATH 09/04/2009  . CHEST PAIN 09/04/2009    Past Surgical History:  Procedure Laterality Date  . CORONARY ANGIOPLASTY WITH STENT PLACEMENT    . LEFT HEART CATH AND CORONARY ANGIOGRAPHY N/A 08/09/2018   Procedure: LEFT HEART CATH AND CORONARY ANGIOGRAPHY;  Surgeon: End,  Harrell Gave, MD;  Location: [facility];  Service: Cardiovascular;  Laterality: N/A;  . LEFT HEART CATHETERIZATION WITH CORONARY ANGIOGRAM N/A 11/15/2012   Procedure: LEFT HEART CATHETERIZATION WITH CORONARY ANGIOGRAM;  Surgeon: Thayer Headings, MD;  Location: [facility] CATH LAB;  Service: Cardiovascular;  Laterality: N/A;  . PERCUTANEOUS CORONARY STENT INTERVENTION (PCI-S)  11/15/2012   Procedure: PERCUTANEOUS CORONARY STENT INTERVENTION (PCI-S);  Surgeon: Thayer Headings, MD;  Location: [facility] CATH LAB;  Service: Cardiovascular;;  . skin cancer removed right lower leg Right   . TONSILLECTOMY    . uterus ablation    . VULVECTOMY N/A 11/08/2019   Procedure:  excision of VULVA, vulvoscopy;  Surgeon: Dian Queen, MD;  Location: [facility];  Service: Gynecology;  Laterality: N/A;     OB History   No obstetric history on file.     Family History  Problem Relation Age of Onset  . Depression Mother   . Anxiety disorder Maternal Grandmother   . Anxiety disorder Paternal Grandmother   . OCD Paternal Grandmother   . Depression Paternal Grandmother   . Heart attack Father        Deceased, 44  . Cerebral aneurysm Father   . Heart disease Father     Social History   Tobacco Use  . Smoking status: Current Every Day Smoker    Packs/day: 2.00    Years: 40.00    Pack years: 80.00    Types: Cigarettes    Start date: 03/29/1979  . Smokeless tobacco: Never Used  . Tobacco comment: Currently 2ppd  Substance Use Topics  . Alcohol use: Yes    Alcohol/week: 0.0 standard drinks    Comment: rare  . Drug use: No    Home Medications Prior to Admission medications   Medication Sig Start Date End Date Taking? Authorizing Provider  albuterol (PROVENTIL HFA;VENTOLIN HFA) 108 (90 Base) MCG/ACT inhaler Inhale 2 puffs into the lungs every 4 (four) hours as needed for wheezing or shortness of breath. 12/21/15   Ward, Delice Bison, DO  ALPRAZolam Duanne Moron) 1 MG tablet Take 1 mg by mouth 2 (two) times daily as needed. 06/12/17   [provider]  aspirin EC 81 MG tablet Take 81 mg by mouth daily.     [provider]  bisoprolol (ZEBETA) 5 MG tablet Take 1 tablet (5 mg total) by mouth daily. Patient taking differently: Take 5 mg by mouth at bedtime.  08/11/18   Isaiah Serge, NP  escitalopram (LEXAPRO) 20 MG tablet Take 1 tablet (20 mg total) by mouth daily. 02/02/14 11/06/20  Leonides Grills, MD  fluticasone (FLONASE) 50 MCG/ACT nasal spray at bedtime.     [provider]  furosemide (LASIX) 20 MG tablet Take 20 mg by mouth as needed.    [provider]  ibuprofen (ADVIL) 200 MG tablet Take 200 mg by mouth  every 6 (six) hours as needed.    [provider]  ipratropium-albuterol (DUONEB) 0.5-2.5 (3) MG/3ML SOLN Inhale 3 mLs into the lungs every 4 (four) hours as needed. 03/03/18   [provider]  lidocaine (XYLOCAINE) 5 % ointment Apply 1 application topically as needed. 11/08/19   Dian Queen, MD  nitroGLYCERIN (NITROSTAT) 0.4 MG SL tablet Place 1 tablet (0.4 mg total) under the tongue every 5 (five) minutes as needed for chest pain (up to 3 doses). 11/16/12   Hope, Jessica A, PA-C  NON FORMULARY Steroid injection to right hip 11-25-2019    [provider]  pantoprazole (PROTONIX) 40 MG tablet Take 40 mg by mouth at bedtime.    [provider]  rosuvastatin (CRESTOR) 20 MG tablet Take 1 tablet (20 mg total) by mouth at bedtime. 08/10/18   Isaiah Serge, NP  traMADol (ULTRAM) 50 MG tablet Take by mouth every 6 (six) hours as needed.    [provider]    Allergies    Morphine and related and Sulfa antibiotics  Review of Systems   Review of Systems  Cardiovascular: Positive for chest pain.  Musculoskeletal: Positive for back pain.  All other systems reviewed and are negative.   Physical Exam Updated Vital Signs BP 109/63   Pulse (!) 56   Temp 99.2 F (37.3 C) (Tympanic)   Resp 10   Ht 5\' 5"  (1.651 m)   Wt 104.3 kg   SpO2 100%   BMI 38.27 kg/m   Physical Exam Vitals and nursing note reviewed.  Constitutional:      General: She is not in acute distress.    Appearance: Normal appearance. She is well-developed.  HENT:     Head: Normocephalic and atraumatic.     Right Ear: Hearing normal.     Left Ear: Hearing normal.     Nose: Nose normal.  Eyes:     Conjunctiva/sclera: Conjunctivae normal.     Pupils: Pupils are equal, round, and reactive to light.  Cardiovascular:     Rate and Rhythm: Regular rhythm.     Heart sounds: S1 normal and S2 normal. No murmur. No friction rub. No gallop.   Pulmonary:     Effort: Pulmonary effort is  normal. No respiratory distress.     Breath sounds: Normal breath sounds.  Chest:     Chest wall: No tenderness.  Abdominal:     General: Bowel sounds are normal.     Palpations: Abdomen is soft.     Tenderness: There is no abdominal tenderness. There is no guarding or rebound. Negative signs include Murphy's sign and McBurney's sign.     Hernia: No hernia is present.  Musculoskeletal:        General: Normal range of motion.     Cervical back: Normal range of motion and neck supple.  Skin:    General: Skin is warm and dry.     Findings: No rash.  Neurological:     Mental Status: She is alert and oriented to person, place, and time.     GCS: GCS eye subscore is 4. GCS verbal subscore is 5. GCS motor subscore is 6.     Cranial Nerves: No cranial nerve deficit.     Sensory: No sensory deficit.     Coordination: Coordination normal.  Psychiatric:        Speech: Speech normal.        Behavior: Behavior normal.        Thought Content: Thought content normal.     ED Results / Procedures / Treatments   Labs (all labs ordered are listed, but only abnormal results are displayed) Labs Reviewed  BASIC METABOLIC PANEL - Abnormal; Notable for the following components:      Result Value   Glucose, Bld 138 (*)    Calcium 8.8 (*)    All other components within normal limits  CBC - Abnormal; Notable for the following components:   Platelets 141 (*)    All other components within normal limits  D-DIMER, QUANTITATIVE (NOT AT [facility]) - Abnormal; Notable for the following components:  D-Dimer, Quant 0.80 (*)    All other components within normal limits  BRAIN NATRIURETIC PEPTIDE  POC URINE PREG, ED  TROPONIN I (HIGH SENSITIVITY)  TROPONIN I (HIGH SENSITIVITY)    EKG EKG Interpretation  Date/Time:  Saturday December 17 2019 22:47:03 EDT Ventricular Rate:  70 PR Interval:    QRS Duration: 100 QT Interval:  408 QTC Calculation: 441 R Axis:   76 Text Interpretation: Sinus rhythm Low  voltage, precordial leads No significant change since last tracing Confirmed by Orpah Greek [phone]) on 12/17/2019 11:09:56 PM   Radiology DG Chest 2 View  Result Date: 12/17/2019 CLINICAL DATA:  Chest pain EXAM: CHEST - 2 VIEW COMPARISON:  Radiograph 08/09/2018 FINDINGS: Atelectatic changes with low lung volumes. No consolidation, features of edema, pneumothorax, or effusion. Pulmonary vascularity is normally distributed. The cardiomediastinal contours are unremarkable. No acute osseous or soft tissue abnormality. Degenerative changes are present in the imaged spine and shoulders. EKG leads project over the chest and along the undersurface of the left breast. IMPRESSION: Low volumes with basilar atelectasis. No other acute cardiopulmonary abnormality. Electronically Signed   By: Lovena Le M.D.   On: 12/17/2019 23:24    Procedures Procedures (including critical care time) Angiocath insertion Performed by: Orpah Greek  Consent: Verbal consent obtained. Risks and benefits: risks, benefits and alternatives were discussed Time out: Immediately prior to procedure a "time out" was called to verify the correct patient, procedure, equipment, support staff and site/side marked as required.  Preparation: Patient was prepped and draped in the usual sterile fashion.  Vein Location: R antecub  Ultrasound Guided  Gauge: 20  Normal blood return and flush without difficulty Patient tolerance: Patient tolerated the procedure well with no immediate complications.    Medications Ordered in ED Medications  iohexol (OMNIPAQUE) 350 MG/ML injection 100 mL (100 mLs Intravenous Contrast Given 12/18/19 0126)  HYDROmorphone (DILAUDID) injection 1 mg (1 mg Intravenous Given 12/18/19 0118)  ondansetron (ZOFRAN) injection 4 mg (4 mg Intravenous Given 12/18/19 0118)    ED Course  I have reviewed the triage vital signs and the nursing notes.  Pertinent labs & imaging results that were  available during my care of the patient were reviewed by me and considered in my medical decision making (see chart for details).    MDM Rules/Calculators/A&P HEAR Score: 3                    Patient presents to the emergency department for evaluation of chest pain.  She has been experiencing pain between her shoulder blades for approximately a week.  Over the last 24 hours she has developed anterior chest pain.  This pain has been present continuously but she has noticed that it worsens with taking deep breaths.  She does not feel particularly short of breath.  She does have a history of coronary artery disease with previous LAD stenting.  Current symptoms, however, are atypical.  EKG is unchanged from previous, no ischemic changes.  Troponins negative.  Based on her pleuritic pain and atypical nature of the chest pain, PE was considered.  D-dimer was mildly elevated and therefore she underwent CT scan.  CT angiography of chest does not show any evidence of PE.  Patient treated with analgesia here in the ER with improvement.  She will be appropriate for outpatient follow-up with her cardiologist.  Final Clinical Impression(s) / ED Diagnoses Final diagnoses:  Acute midline thoracic back pain  Chest pain, unspecified type  Rx / DC Orders ED Discharge Orders    None       Cherilynn Schomburg, Gwenyth Allegra, MD 12/18/19 [phone]

## 2019-12-17 NOTE — ED Triage Notes (Signed)
Pt c/o CP worse with breathing. Michela Pitcher it feels like a heaviness and pain between shoulders.

## 2019-12-18 DIAGNOSIS — R079 Chest pain, unspecified: Secondary | ICD-10-CM | POA: Diagnosis not present

## 2019-12-18 LAB — TROPONIN I (HIGH SENSITIVITY): Troponin I (High Sensitivity): 3 ng/L (ref <18)

## 2019-12-18 IMAGING — CT CT ANGIO CHEST
2 of 6 series · 17 of 46 positions shown · IV contrast (Omnipaque or Isovue)
Comparison: Chest radiograph [DATE]

CLINICAL DATA: Chest pain, worse with inspiration, and heaviness
and pain between the shoulders

EXAM:
CT ANGIOGRAPHY CHEST WITH CONTRAST
TECHNIQUE: Multidetector CT imaging of the chest was performed using the
standard protocol during bolus administration of intravenous
contrast. Multiplanar CT image reconstructions and MIPs were
obtained to evaluate the vascular anatomy.
CONTRAST:  100mL OMNIPAQUE IOHEXOL 350 MG/ML SOLN

[Series 5: pe axial thins · axial · 0.83mm/px · z∈[+1075,+1368]mm · 14 of 321 slices shown]
[im 14/321  lung]
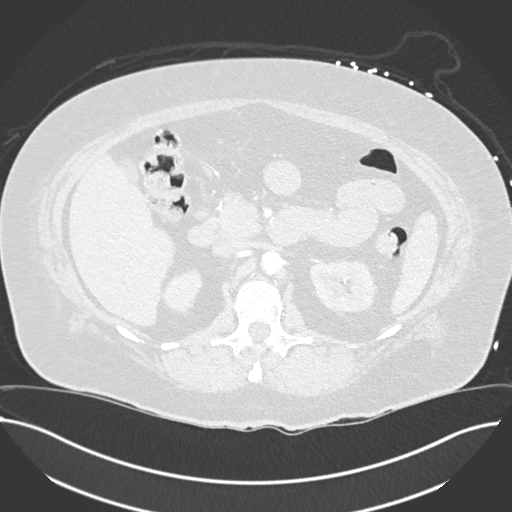
[im 42/321  soft-tissue]
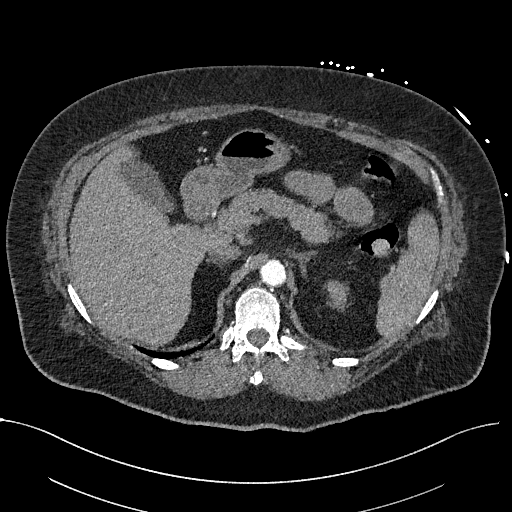
[im 56/321  lung]
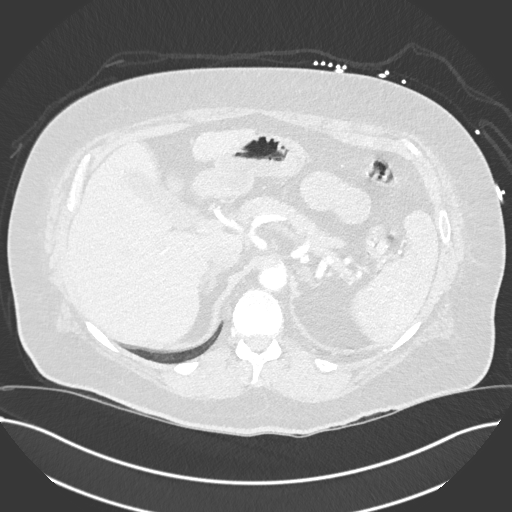
[im 84/321  soft-tissue]
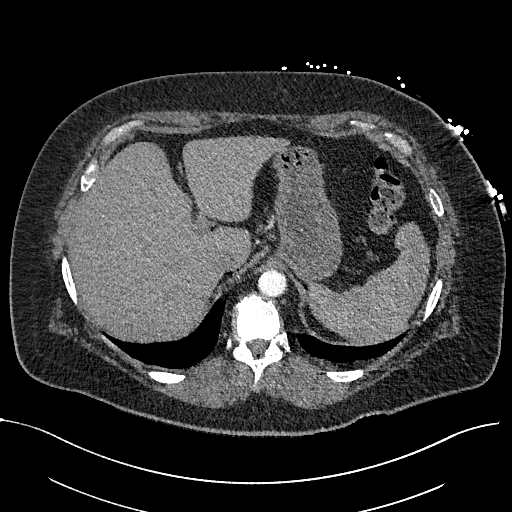
[im 112/321  lung]
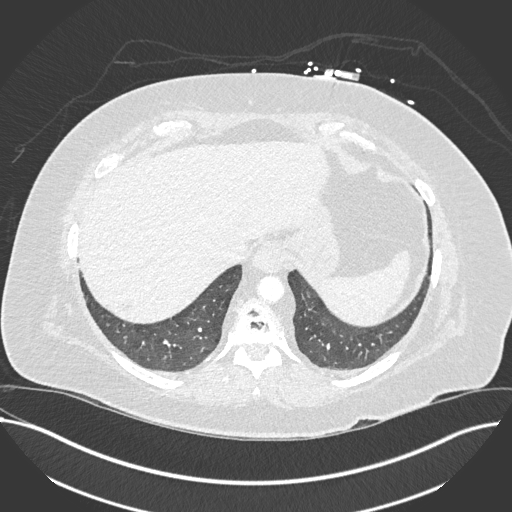
[im 126/321  soft-tissue]
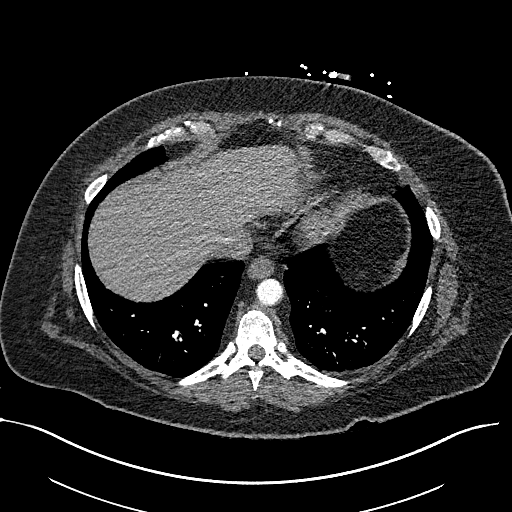
[im 154/321  lung]
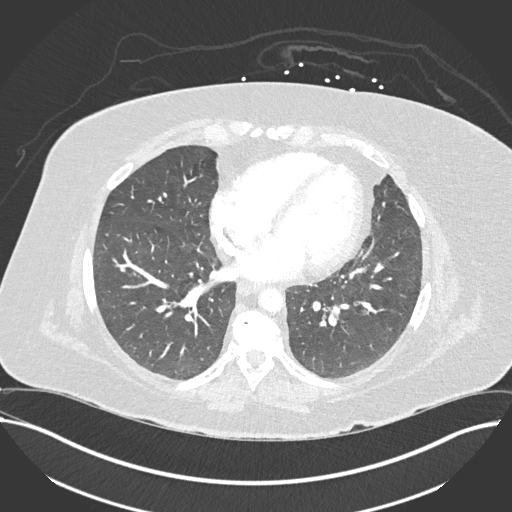
[im 167/321  soft-tissue]
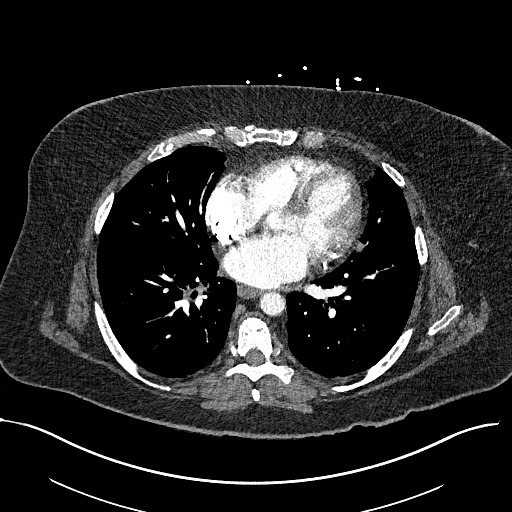
[im 195/321  lung]
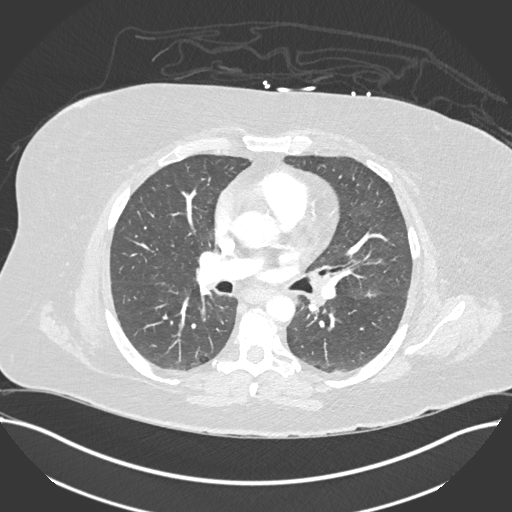
[im 209/321  soft-tissue]
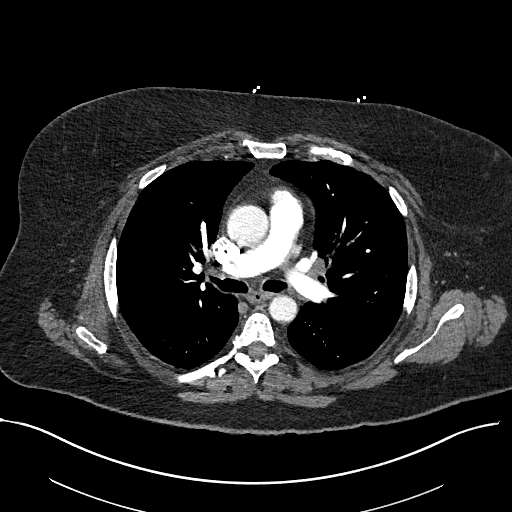
[im 237/321  lung]
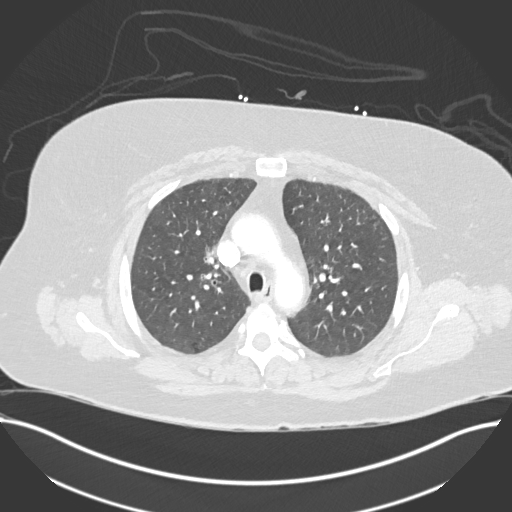
[im 265/321  soft-tissue]
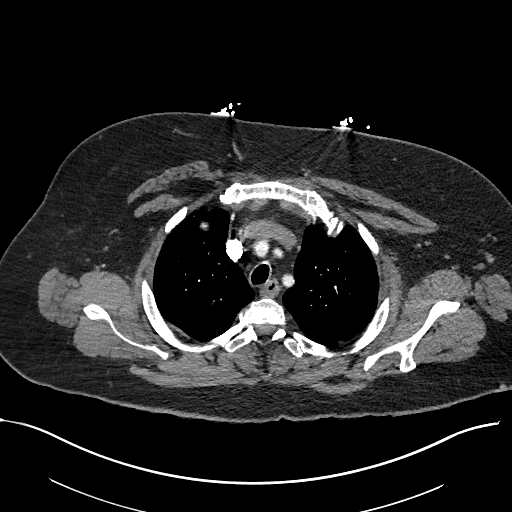
[im 279/321  lung]
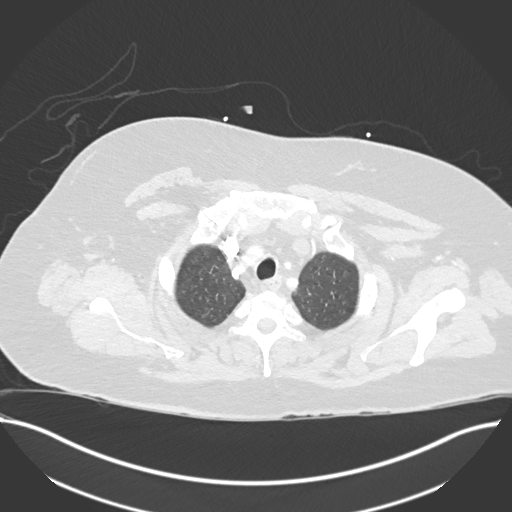
[im 307/321  soft-tissue]
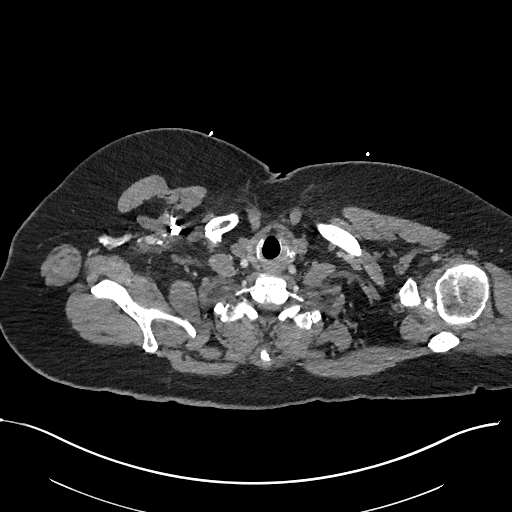

[Series 7: cor soft · coronal · 0.65mm/px · 3 of 158 slices shown]
[im 40/158  soft-tissue]
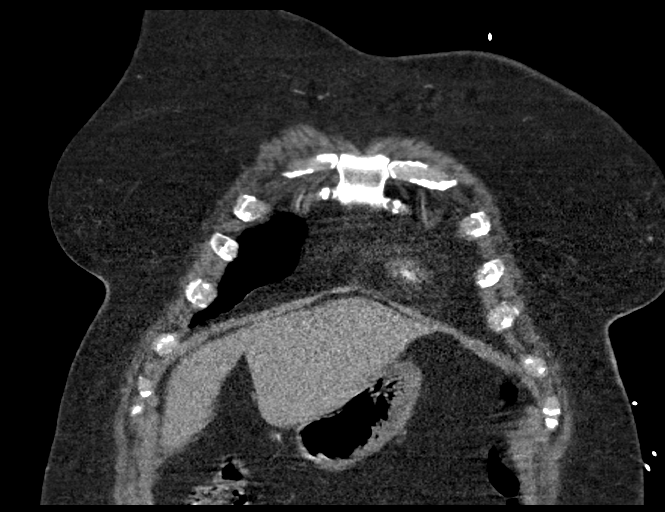
[im 79/158  soft-tissue]
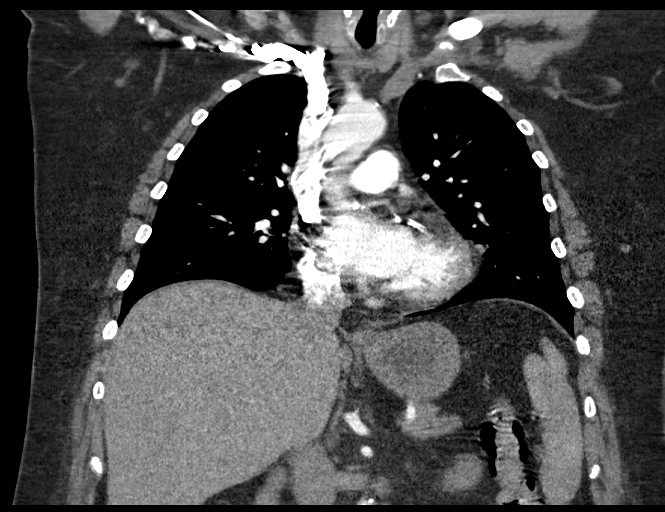
[im 118/158  soft-tissue]
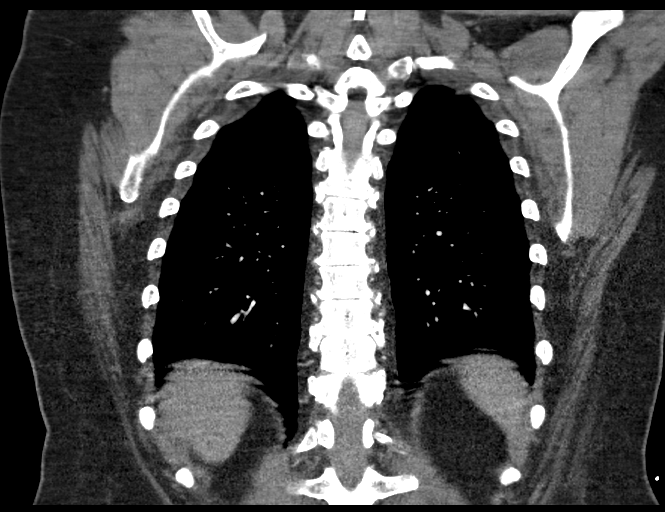

[17 of 46 positions shown; findings below may reference images not displayed]

FINDINGS: Cardiovascular: Satisfactory opacification the pulmonary arteries to
the segmental level. Evaluation of the right upper lobe complicated
by adjacent streak artifact from contrast bolus retained in the SVC.
No pulmonary artery filling defects are identified. Central
pulmonary arteries are normal caliber. Normal heart size. No
pericardial effusion. Extensive three-vessel coronary artery disease
is noted. Atherosclerotic plaque within the normal caliber aorta.
Normal 3 vessel branching of the aortic arch with minimal plaque in
the proximal great vessels.

Mediastinum/Nodes: No mediastinal fluid or gas. Normal thyroid gland
and thoracic inlet. No acute abnormality of the trachea or
esophagus. No worrisome mediastinal, hilar or axillary adenopathy.

Lungs/Pleura: Few scattered sub 5 mm pulmonary nodules (6/47, 54,
63, 90). Favored to be post infectious or inflammatory or reflective
of subpleural lymph nodes.

Upper Abdomen: No acute abnormalities present in the visualized
portions of the upper abdomen.

Musculoskeletal: Multilevel degenerative changes are present in the
imaged portions of the spine. Slightly exaggerated midthoracic
kyphosis and slight dextrocurvature of the spine. No acute osseous
abnormality or suspicious osseous lesion.

Review of the MIP images confirms the above findings.
IMPRESSION: 1. No acute findings in the chest. Specifically, no evidence of
acute pulmonary embolism though there is some streak artifact from
the retained contrast in the IVC which may limit detection of
smaller emboli in the right upper lobe.
2. Extensive three-vessel coronary artery calcifications are
present. Please note that the presence of coronary artery calcium
documents the presence of coronary artery disease, the severity of
this disease and any potential stenosis cannot be assessed on this
non-gated CT examination. Assessment for potential risk factor
modification, dietary therapy or pharmacologic therapy may be
warranted.
3. Few scattered sub 5 mm pulmonary nodules. No follow-up needed if
patient is low-risk (and has no known or suspected primary
neoplasm). Non-contrast chest CT can be considered in 12 months if
patient is high-risk. This recommendation follows the consensus
statement: Guidelines for Management of Incidental Pulmonary Nodules
Detected on CT Images: From the [HOSPITAL] [UQ]; Radiology
4. Aortic Atherosclerosis ([UQ]-[UQ]).

## 2019-12-18 MED ORDER — ONDANSETRON HCL 4 MG/2ML IJ SOLN
4.0000 mg | Freq: Once | INTRAMUSCULAR | Status: AC
  Administered 2019-12-18: 4 mg via INTRAVENOUS
  Filled 2019-12-18: qty 2

## 2019-12-18 MED ORDER — HYDROMORPHONE HCL 1 MG/ML IJ SOLN
1.0000 mg | Freq: Once | INTRAMUSCULAR | Status: AC
  Administered 2019-12-18: 1 mg via INTRAVENOUS
  Filled 2019-12-18: qty 1

## 2019-12-18 MED ORDER — HYDROCODONE-ACETAMINOPHEN 5-325 MG PO TABS: 1.0000 | ORAL_TABLET | ORAL | 0 refills | Status: DC | PRN

## 2019-12-18 NOTE — ED Notes (Signed)
Pt given prepack of vicodin and d.c papers. Pt verbalized understanding. Pt alert and oriented x 4 . Depart with family

## 2019-12-19 MED FILL — Hydrocodone-Acetaminophen Tab 5-325 MG: ORAL | Qty: 6 | Status: AC

## 2019-12-29 DIAGNOSIS — M47816 Spondylosis without myelopathy or radiculopathy, lumbar region: Secondary | ICD-10-CM | POA: Diagnosis not present

## 2019-12-29 DIAGNOSIS — G894 Chronic pain syndrome: Secondary | ICD-10-CM | POA: Diagnosis not present

## 2019-12-29 DIAGNOSIS — M47896 Other spondylosis, lumbar region: Secondary | ICD-10-CM | POA: Diagnosis not present

## 2020-01-03 ENCOUNTER — Ambulatory Visit: Payer: Medicare HMO | Admitting: Dermatology

## 2020-01-09 ENCOUNTER — Ambulatory Visit
Admission: EM | Admit: 2020-01-09 | Discharge: 2020-01-09 | Disposition: A | Discharge status: Home / Self Care | Payer: Medicare HMO | Attending: Family Medicine | Admitting: Family Medicine

## 2020-01-09 ENCOUNTER — Other Ambulatory Visit: Payer: Self-pay

## 2020-01-09 DIAGNOSIS — R21 Rash and other nonspecific skin eruption: Secondary | ICD-10-CM

## 2020-01-09 DIAGNOSIS — L2489 Irritant contact dermatitis due to other agents: Secondary | ICD-10-CM | POA: Diagnosis not present

## 2020-01-09 MED ORDER — HYDROXYZINE HCL 25 MG PO TABS: 25.0000 mg | ORAL_TABLET | Freq: Four times a day (QID) | ORAL | 0 refills | Status: DC | PRN

## 2020-01-09 MED ORDER — TRIAMCINOLONE ACETONIDE 0.1 % EX CREA: 1.0000 "application " | TOPICAL_CREAM | Freq: Two times a day (BID) | CUTANEOUS | 0 refills | Status: DC

## 2020-01-09 MED ORDER — PREDNISONE 20 MG PO TABS: ORAL_TABLET | ORAL | 0 refills | Status: DC

## 2020-01-09 MED ORDER — METHYLPREDNISOLONE SODIUM SUCC 125 MG IJ SOLR
80.0000 mg | Freq: Once | INTRAMUSCULAR | Status: AC
  Administered 2020-01-09: 16:00:00 80 mg via INTRAMUSCULAR

## 2020-01-09 NOTE — ED Triage Notes (Signed)
Pt presents with rash on face and arms and torso , pt was doing yard work this week

## 2020-01-09 NOTE — Discharge Instructions (Signed)
Take prednisone as directed and with food.  Avoid caffeine and foods rich in sugar as this can cause hyperactivity and caffeine can cause palpitations especially when taken with prednisone. I have sent over hydroxyzine take as prescribed if itching worsens however do not take with Benadryl as these are the same drug classes of medication. I have also sent over jar of triamcinolone cream avoid placing this anywhere on your face as this can be very dangerous if it gets into the eyes.  Apply to affected areas of arms and abdomen.

## 2020-01-09 NOTE — ED Provider Notes (Signed)
[facility]    CSN: [id number] Arrival date & time: 01/09/20  1500      History   Chief Complaint Chief Complaint  Patient presents with  . Rash    HPI Shannon [name] is a 54 y.o. female.   HPI Rash: Patient complains of rash involving the face, under the eyes, bilateral upper extremities, in skin folds of the abdomen.  Rash started 1-2 days ago however this morning erupted on her face which caused her to come in for evaluation.  Appearance of rash at onset: Macular and erythematous.  Two days ago patient was outside working for prolonged period of time although was unaware of any direct contact with poison ivy or poison oak. Denies any problems swallowing or irritation of the throat. She has taken a dose of benadryl today prior to arriving in the clinic today. Past Medical History:  Diagnosis Date  . Anemia   . Anxiety   . Arthritis    oa needs hip replacement on right  . Cancer ([place])    squamous cell areas removed from vulva and pre caner areas removed from leg   . Carotid artery occlusion   . Chronic diastolic HF (heart failure) ([place])    pt denies  . COPD (chronic obstructive pulmonary disease) ([place])    pt denies  . Coronary artery disease    LHC 11/25/12 90% stenosis mid LCx & otherwise nonobstructive dz w/ EF 60-65% S/p PTCA/DES to LCx  . Depression   . Elevated cholesterol   . Exertional dyspnea    chronic  . GERD (gastroesophageal reflux disease)   . History of cardiovascular stress test 06/2015   low risk  . HPV in female   . Hyperlipidemia   . Hypertension   . MI (myocardial infarction) ([place]) fe 17, 2014  . Obesity   . Polycystic ovary disease   . Skin tear of upper arm without complication, left, sequela    small skin tear left upper arm no drainage for 1 week  . Tobacco abuse     Patient Active Problem List   Diagnosis Date Noted  . Chronic diastolic HF (heart failure) ([place]) 08/10/2018  . GERD (gastroesophageal reflux disease) 08/09/2018    . Unstable angina ([place]) 08/09/2018  . Carotid artery disease ([place]) 07/24/2017  . AAA (abdominal aortic aneurysm) without rupture ([place]) 01/05/2013  . Other malaise and fatigue 01/05/2013  . Palpitations 01/05/2013  . Tobacco use disorder 12/02/2012  . Coronary artery disease   . Hyperlipidemia   . Hypertension   . SHORTNESS OF BREATH 09/04/2009  . CHEST PAIN 09/04/2009    Past Surgical History:  Procedure Laterality Date  . CORONARY ANGIOPLASTY WITH STENT PLACEMENT    . LEFT HEART CATH AND CORONARY ANGIOGRAPHY N/A 08/09/2018   Procedure: LEFT HEART CATH AND CORONARY ANGIOGRAPHY;  Surgeon: Nelva Bush, MD;  Location: [facility];  Service: Cardiovascular;  Laterality: N/A;  . LEFT HEART CATHETERIZATION WITH CORONARY ANGIOGRAM N/A 11/15/2012   Procedure: LEFT HEART CATHETERIZATION WITH CORONARY ANGIOGRAM;  Surgeon: Thayer Headings, MD;  Location[facility];  Service: Cardiovascular;  Laterality: N/A;  . PERCUTANEOUS CORONARY STENT INTERVENTION (PCI-S)  11/15/2012   Procedure: PERCUTANEOUS CORONARY STENT INTERVENTION (PCI-S);  Surgeon: Thayer Headings, MD;  Location: [facility] CATH LAB;  Service: Cardiovascular;;  . skin cancer removed right lower leg Right   . TONSILLECTOMY    . uterus ablation    . VULVECTOMY N/A 11/08/2019   Procedure: excision of  VULVA, vulvoscopy;  Surgeon: Dian Queen, MD;  Location: [facility];  Service: Gynecology;  Laterality: N/A;    OB History   No obstetric history on file.      Home Medications    Prior to Admission medications   Medication Sig Start Date End Date Taking? Authorizing Provider  albuterol (PROVENTIL HFA;VENTOLIN HFA) 108 (90 Base) MCG/ACT inhaler Inhale 2 puffs into the lungs every 4 (four) hours as needed for wheezing or shortness of breath. 12/21/15   Ward, Delice Bison, DO  ALPRAZolam Duanne Moron) 1 MG tablet Take 1 mg by mouth 2 (two) times daily as needed. 06/12/17   [provider]  aspirin EC 81 MG  tablet Take 81 mg by mouth daily.     [provider]  bisoprolol (ZEBETA) 5 MG tablet Take 1 tablet (5 mg total) by mouth daily. Patient taking differently: Take 5 mg by mouth at bedtime.  08/11/18   Isaiah Serge, NP  escitalopram (LEXAPRO) 20 MG tablet Take 1 tablet (20 mg total) by mouth daily. 02/02/14 11/06/20  Leonides Grills, MD  fluticasone (FLONASE) 50 MCG/ACT nasal spray at bedtime.     [provider]  furosemide (LASIX) 20 MG tablet Take 20 mg by mouth as needed.    [provider]  HYDROcodone-acetaminophen (NORCO/VICODIN) 5-325 MG tablet Take 1-2 tablets by mouth every 4 (four) hours as needed. 12/18/19   Orpah Greek, MD  ibuprofen (ADVIL) 200 MG tablet Take 200 mg by mouth every 6 (six) hours as needed.    [provider]  ipratropium-albuterol (DUONEB) 0.5-2.5 (3) MG/3ML SOLN Inhale 3 mLs into the lungs every 4 (four) hours as needed. 03/03/18   [provider]  lidocaine (XYLOCAINE) 5 % ointment Apply 1 application topically as needed. 11/08/19   Dian Queen, MD  nitroGLYCERIN (NITROSTAT) 0.4 MG SL tablet Place 1 tablet (0.4 mg total) under the tongue every 5 (five) minutes as needed for chest pain (up to 3 doses). 11/16/12   Hope, Jessica A, PA-C  NON FORMULARY Steroid injection to right hip 11-25-2019    [provider]  pantoprazole (PROTONIX) 40 MG tablet Take 40 mg by mouth at bedtime.    [provider]  rosuvastatin (CRESTOR) 20 MG tablet Take 1 tablet (20 mg total) by mouth at bedtime. 08/10/18   Isaiah Serge, NP  traMADol (ULTRAM) 50 MG tablet Take by mouth every 6 (six) hours as needed.    [provider]    Family History Family History  Problem Relation Age of Onset  . Depression Mother   . Anxiety disorder Maternal Grandmother   . Anxiety disorder Paternal Grandmother   . OCD Paternal Grandmother   . Depression Paternal Grandmother   . Heart attack Father        Deceased,  22  . Cerebral aneurysm Father   . Heart disease Father     Social History Social History   Tobacco Use  . Smoking status: Current Every Day Smoker    Packs/day: 2.00    Years: 40.00    Pack years: 80.00    Types: Cigarettes    Start date: 03/29/1979  . Smokeless tobacco: Never Used  . Tobacco comment: Currently 2ppd  Substance Use Topics  . Alcohol use: Yes    Alcohol/week: 0.0 standard drinks    Comment: rare  . Drug use: No     Allergies   Morphine and related and Sulfa antibiotics  Review of Systems  Review of Systems Pertinent negatives listed in HPI Physical Exam Triage Vital Signs ED Triage Vitals  Enc Vitals Group     BP 01/09/20 1513 119/78     Pulse Rate 01/09/20 1513 65     Resp 01/09/20 1513 18     Temp 01/09/20 1513 98.3 F (36.8 C)     Temp src --      SpO2 01/09/20 1513 97 %     Weight --      Height --      Head Circumference --      Peak Flow --      Pain Score 01/09/20 1511 0     Pain Loc --      Pain Edu? --      Excl. in [place]? --    No data found.  Updated Vital Signs BP 119/78   Pulse 65   Temp 98.3 F (36.8 C)   Resp 18   SpO2 97%   Visual Acuity Right Eye Distance:   Left Eye Distance:   Bilateral Distance:    Right Eye Near:   Left Eye Near:    Bilateral Near:     Physical Exam General appearance: alert, cooperative, and without distress Head: Normocephalic, without obvious abnormality, atraumatic Respiratory: Respirations even and unlabored, normal respiratory rate Heart: rate and rhythm normal. No gallop or murmurs noted on exam  Abdomen: BS +, no distention, no rebound tenderness, or no mass Extremities: No gross deformities Skin: Rash present. Maculopapular, erythema present: face, neck, torso, and abdominal fold. Psych: Anxious  Neurologic: Alert, oriented to person, place, and time, thought content appropriate. UC Treatments / Results  Labs (all labs ordered are listed, but only abnormal results are  displayed) Labs Reviewed - No data to display  EKG   Radiology No results found.  Procedures Procedures (including critical care time)  Medications Ordered in UC Medications - No data to display  Initial Impression / Assessment and Plan / UC Course  I have reviewed the triage vital signs and the nursing notes.  Pertinent labs & imaging results that were available during my care of the patient were reviewed by me and considered in my medical decision making (see chart for details).    Contact dermatitis  -Solumedrol 80 mg IM given in clinic today -Tomorrow start Prednisone 20 mg,  in mornings with breakfast as follows:  Take 3 pills for 3 days, Take 2 pills for 3 days, and Take 1 pill for 3 days.  Complete all medication. -Hydroxyzine 25 mg every 6 hours as needed for itching. Stop benadryl  -Triamcinolone cream apply to affected areas-do not apply to face- 3 times daily until rash resolves.  Final Clinical Impressions(s) / UC Diagnoses   Final diagnoses:  Irritant contact dermatitis due to other agents     Discharge Instructions     Take prednisone as directed and with food.  Avoid caffeine and foods rich in sugar as this can cause hyperactivity and caffeine can cause palpitations especially when taken with prednisone. I have sent over hydroxyzine take as prescribed if itching worsens however do not take with Benadryl as these are the same drug classes of medication. I have also sent over jar of triamcinolone cream avoid placing this anywhere on your face as this can be very dangerous if it gets into the eyes.  Apply to affected areas of arms and abdomen.    ED Prescriptions    Medication Sig Dispense Auth. Provider   predniSONE (  DELTASONE) 20 MG tablet Take 3 PO QAM x3days, 2 PO QAM x3days, 1 PO QAM x3days 18 tablet Scot Jun, FNP   hydrOXYzine (ATARAX/VISTARIL) 25 MG tablet Take 1 tablet (25 mg total) by mouth every 6 (six) hours as needed for itching. 10 tablet  Scot Jun, FNP   triamcinolone cream (KENALOG) 0.1 % Apply 1 application topically 2 (two) times daily. 454 g Scot Jun, FNP     PDMP not reviewed this encounter.   Scot Jun, [place] 01/11/20 [phone]

## 2020-01-17 DIAGNOSIS — M1612 Unilateral primary osteoarthritis, left hip: Secondary | ICD-10-CM | POA: Diagnosis not present

## 2020-01-17 DIAGNOSIS — M169 Osteoarthritis of hip, unspecified: Secondary | ICD-10-CM | POA: Diagnosis not present

## 2020-02-09 DIAGNOSIS — M47896 Other spondylosis, lumbar region: Secondary | ICD-10-CM | POA: Diagnosis not present

## 2020-02-09 DIAGNOSIS — M47816 Spondylosis without myelopathy or radiculopathy, lumbar region: Secondary | ICD-10-CM | POA: Diagnosis not present

## 2020-02-15 DIAGNOSIS — N951 Menopausal and female climacteric states: Secondary | ICD-10-CM | POA: Diagnosis not present

## 2020-02-15 DIAGNOSIS — Z86002 Personal history of in-situ neoplasm of other and unspecified genital organs: Secondary | ICD-10-CM | POA: Diagnosis not present

## 2020-02-15 DIAGNOSIS — G8929 Other chronic pain: Secondary | ICD-10-CM | POA: Diagnosis not present

## 2020-02-15 DIAGNOSIS — F419 Anxiety disorder, unspecified: Secondary | ICD-10-CM | POA: Diagnosis not present

## 2020-02-15 DIAGNOSIS — N760 Acute vaginitis: Secondary | ICD-10-CM | POA: Diagnosis not present

## 2020-02-21 ENCOUNTER — Encounter (HOSPITAL_COMMUNITY): Payer: Self-pay | Admitting: Physical Therapy

## 2020-02-21 ENCOUNTER — Ambulatory Visit (HOSPITAL_COMMUNITY): Payer: Medicare HMO | Attending: Chiropractic Medicine | Admitting: Physical Therapy

## 2020-02-21 ENCOUNTER — Other Ambulatory Visit: Payer: Self-pay

## 2020-02-21 DIAGNOSIS — R29898 Other symptoms and signs involving the musculoskeletal system: Secondary | ICD-10-CM | POA: Diagnosis not present

## 2020-02-21 DIAGNOSIS — M6281 Muscle weakness (generalized): Secondary | ICD-10-CM | POA: Diagnosis not present

## 2020-02-21 DIAGNOSIS — M545 Low back pain, unspecified: Secondary | ICD-10-CM

## 2020-02-21 DIAGNOSIS — R2689 Other abnormalities of gait and mobility: Secondary | ICD-10-CM | POA: Diagnosis not present

## 2020-02-21 NOTE — Therapy (Signed)
[facility] [place], [place], [address] Phone: [phone]   Fax:  [phone]  Physical Therapy Evaluation  Patient Details  Name: Shannon [name] MRN: [id number] Date of Birth: 09/20/1966 Referring Provider (PT): Levy Pupa PA-C   Encounter Date: 02/21/2020  PT End of Session - 02/21/20 1130    Visit Number  1    Number of Visits  12    Date for PT Re-Evaluation  04/03/20    Authorization Type  [employer]    Authorization Time Period  02/22/20-04/03/20    Authorization - Visit Number  1    Authorization - Number of Visits  12    Progress Note Due on Visit  10    PT Start Time  0950    PT Stop Time  1030    PT Time Calculation (min)  40 min    Activity Tolerance  Patient tolerated treatment well    Behavior During Therapy  [facility] for tasks assessed/performed       Past Medical History:  Diagnosis Date  . Anemia   . Anxiety   . Arthritis    oa needs hip replacement on right  . Cancer ([place])    squamous cell areas removed from vulva and pre caner areas removed from leg   . Carotid artery occlusion   . Chronic diastolic HF (heart failure) ([place])    pt denies  . COPD (chronic obstructive pulmonary disease) ([place])    pt denies  . Coronary artery disease    LHC 11/25/12 90% stenosis mid LCx & otherwise nonobstructive dz w/ EF 60-65% S/p PTCA/DES to LCx  . Depression   . Elevated cholesterol   . Exertional dyspnea    chronic  . GERD (gastroesophageal reflux disease)   . History of cardiovascular stress test 06/2015   low risk  . HPV in female   . Hyperlipidemia   . Hypertension   . MI (myocardial infarction) ([place]) fe 17, 2014  . Obesity   . Polycystic ovary disease   . Skin tear of upper arm without complication, left, sequela    small skin tear left upper arm no drainage for 1 week  . Tobacco abuse     Past Surgical History:  Procedure Laterality Date  . CORONARY ANGIOPLASTY WITH STENT PLACEMENT    .  LEFT HEART CATH AND CORONARY ANGIOGRAPHY N/A 08/09/2018   Procedure: LEFT HEART CATH AND CORONARY ANGIOGRAPHY;  Surgeon: Nelva Bush, MD;  Location: [facility];  Service: Cardiovascular;  Laterality: N/A;  . LEFT HEART CATHETERIZATION WITH CORONARY ANGIOGRAM N/A 11/15/2012   Procedure: LEFT HEART CATHETERIZATION WITH CORONARY ANGIOGRAM;  Surgeon: Thayer Headings, MD;  Location: [facility] CATH LAB;  Service: Cardiovascular;  Laterality: N/A;  . PERCUTANEOUS CORONARY STENT INTERVENTION (PCI-S)  11/15/2012   Procedure: PERCUTANEOUS CORONARY STENT INTERVENTION (PCI-S);  Surgeon: Thayer Headings, MD;  Location: [facility] CATH LAB;  Service: Cardiovascular;;  . skin cancer removed right lower leg Right   . TONSILLECTOMY    . uterus ablation    . VULVECTOMY N/A 11/08/2019   Procedure: excision of VULVA, vulvoscopy;  Surgeon: Dian Queen, MD;  Location: [facility];  Service: Gynecology;  Laterality: N/A;    There were no vitals filed for this visit.   Subjective Assessment - 02/21/20 0951    Subjective  Patient is a 54 y.o. female who presents to physical therapy with c/o low back pain that began about 1 year  ago. Patient states she has facet arthritis in her back and she needs both hips replaced. She has neck/shoulder pain on both sides. She thinks that walking has made her back worse because she limps. She wants to get back to the gym and go walking. Symptoms increase with walking, sitting too long, bending, stairs, transfers, standing. Symptoms improve with sleeping and lying in bed. She is very limited with stiffness. 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 Patient states her main goal is to get out of some of the pain. Patient has numbness for years in R thigh.    Pertinent History  blocked carotid artery, chronic LBP, hip pain    Limitations  Sitting;Lifting;Standing;Walking;House hold activities    How long  can you stand comfortably?  unable    How long can you walk comfortably?  unable    Patient Stated Goals  get out of some of the pain.    Currently in Pain?  Yes    Pain Score  7    worst 8/10   Pain Location  Back         [facility] PT Assessment - 02/21/20 0001      Assessment   Medical Diagnosis  Spondylosis, Lumbar region    Referring Provider (PT)  Levy Pupa PA-C    Onset Date/Surgical Date  02/21/19    Next MD Visit  none scheduled yet    Prior Therapy  yes      Precautions   Precautions  None      Restrictions   Weight Bearing Restrictions  No      Balance Screen   Has the patient fallen in the past 6 months  No    Has the patient had a decrease in activity level because of a fear of falling?   No    Is the patient reluctant to leave their home because of a fear of falling?   No      Prior Function   Level of Independence  Independent    Vocation  On disability    Leisure  walking, do household chores more often      Cognition   Overall Cognitive Status  Within Functional Limits for tasks assessed      Observation/Other Assessments   Observations  Antalgic gait, guarded movements    Focus on Therapeutic Outcomes (FOTO)   74% limited      Sensation   Light Touch  Impaired Detail    Additional Comments  decreased L3 on R; decreased L4,L5 on L      Posture/Postural Control   Posture/Postural Control  Postural limitations    Postural Limitations  Forward head;Rounded Shoulders;Decreased lumbar lordosis    Posture Comments  slouched posture in seated      ROM / Strength   AROM / PROM / Strength  AROM;Strength      AROM   Overall AROM Comments  pain with all lumbar mobility    AROM Assessment Site  Lumbar    Lumbar Flexion  75% limited pain    Lumbar Extension  75% limited pain    Lumbar - Right Side Bend  75% limited pain    Lumbar - Left Side Bend  75% limited pain    Lumbar - Right Rotation  50% limited pain    Lumbar - Left Rotation  50% limited       Strength   Overall  Strength Comments  low back and hip pain with hip testing; leg pain with all other MMT    Strength Assessment Site  Hip;Knee;Ankle    Right/Left Hip  Right;Left    Right Hip Flexion  4/5    Left Hip Flexion  4/5    Right/Left Knee  Right;Left    Right Knee Flexion  4/5    Right Knee Extension  4/5    Left Knee Flexion  4/5    Left Knee Extension  4/5    Right/Left Ankle  Right;Left    Right Ankle Dorsiflexion  4/5    Left Ankle Dorsiflexion  4/5      Palpation   Spinal mobility  assess next session    Palpation comment  assess next session      Transfers   Five time sit to stand comments   27.40 seconds    Comments  slow, labored, requires use of hands      Ambulation/Gait   Ambulation/Gait  Yes    Ambulation/Gait Assistance  6: Modified independent (Device/Increase time)    Ambulation Distance (Feet)  100 Feet    Assistive device  None    Gait Pattern  Antalgic    Ambulation Surface  Level;Indoor    Gait Comments  --                  Objective measurements completed on examination: See above findings.              PT Education - 02/21/20 1004    Education Details  Patient educated on exam findings, POC, scope of PT    Person(s) Educated  Patient    Methods  Explanation;Demonstration    Comprehension  Verbalized understanding;Returned demonstration       PT Short Term Goals - 02/21/20 1157      PT SHORT TERM GOAL #1   Title  Patient will be independent with HEP in order to improve functional outcomes.    Time  3    Period  Weeks    Status  New    Target Date  03/13/20      PT SHORT TERM GOAL #2   Title  Patient will report at least 25% improvement in symptoms for improved quality of life.    Time  3    Period  Weeks    Status  New    Target Date  03/13/20        PT Long Term Goals - 02/21/20 1158      PT LONG TERM GOAL #1   Title  Patient will report at least 75% improvement in symptoms for improved quality  of life.    Time  6    Period  Weeks    Status  New    Target Date  04/03/20      PT LONG TERM GOAL #2   Title  Patient will improve FOTO score by at least 5 points in order to indicate improved tolerance to activity.    Time  6    Period  Weeks    Status  New    Target Date  04/03/20      PT LONG TERM GOAL #3   Title  Patient will be able to complete 5x STS in under 11.4 seconds in order to reduce the risk of falls.    Time  6    Period  Weeks    Status  New    Target Date  04/03/20      PT LONG TERM GOAL #4   Title  Patient will demonstrate at least 25% improvement in lumbar ROM in all planes for improved ability to move trunk for housework.    Time  6    Period  Weeks    Status  New    Target Date  04/03/20             Plan - 02/21/20 1146    Clinical Impression Statement  Patient is a 54 y.o. female who presents to physical therapy with c/o low back pain that began about 1 year ago. She presents with pain limited deficits in lumbar and LE strength, ROM, endurance, postural impairments, spinal mobility, gait, activity tolerance, and functional mobility with ADL. She is having to modify and restrict ADL as indicated by FOTO score as well as subjective information and objective measures which is affecting overall participation. Patient also states history of depression which causes symptoms to increase. Patient has significant health history and psychosocial factors likely contributing to symptoms as well. Patient will benefit from skilled physical therapy in order to improve function and reduce impairment.    Personal Factors and Comorbidities  Comorbidity 3+;Fitness;Past/Current Experience;Time since onset of injury/illness/exacerbation;Social Background    Comorbidities  anxiety, depression, HTN, carotid artery blockage on L, chronic LBP, chronic hip pain, sedentary lifestyle    Examination-Activity Limitations  Bathing;Bed Mobility;Bend;Caring for  Others;Carry;Continence;Dressing;Hygiene/Grooming;Lift;Locomotion Level;Sit;Squat;Stairs;Stand;Transfers    Examination-Participation Restrictions  Cleaning;Church;Community Activity;Driving;Meal Prep;Shop;Volunteer;Yard Work    [employer]  Evolving/Moderate complexity    Clinical Decision Making  Moderate    Rehab Potential  Fair    PT Frequency  2x / week    PT Duration  6 weeks    PT Treatment/Interventions  ADLs/Self Care Home Management;Aquatic Therapy;Biofeedback;Cryotherapy;Electrical Stimulation;Iontophoresis 4mg /ml Dexamethasone;Moist Heat;Traction;Ultrasound;DME Instruction;Gait training;Stair training;Functional mobility training;Therapeutic activities;Therapeutic exercise;Balance training;Neuromuscular re-education;Patient/family education;Orthotic Fit/Training;Manual techniques;Manual lymph drainage;Passive range of motion;Dry needling;Energy conservation;Splinting;Taping;Spinal Manipulations;Joint Manipulations    PT Next Visit Plan  Further assess palpation and spinal mobility, possibly initiate manual therapy for pain relief and improving lumbar mobility, begin core and LE strengthing as able; eventually show her exercises she can perform at the [facility]  initiate next session    Consulted and Agree with Plan of Care  Patient       Patient will benefit from skilled therapeutic intervention in order to improve the following deficits and impairments:  Abnormal gait, Decreased activity tolerance, Decreased balance, Decreased mobility, Decreased endurance, Decreased range of motion, Decreased strength, Difficulty walking, Increased muscle spasms, Impaired perceived functional ability, Impaired sensation, Improper body mechanics, Pain  Visit Diagnosis: Low back pain, unspecified back pain laterality, unspecified chronicity, unspecified whether sciatica present  Muscle weakness (generalized)  Other abnormalities of gait and mobility  Other  symptoms and signs involving the musculoskeletal system     Problem List Patient Active Problem List   Diagnosis Date Noted  . Chronic diastolic HF (heart failure) ([place]) 08/10/2018  . GERD (gastroesophageal reflux disease) 08/09/2018  . Unstable angina ([place]) 08/09/2018  . Carotid artery disease ([place]) 07/24/2017  . AAA (abdominal aortic aneurysm) without rupture ([place]) 01/05/2013  . Other malaise and fatigue 01/05/2013  . Palpitations 01/05/2013  . Tobacco use disorder 12/02/2012  . Coronary artery disease   . Hyperlipidemia   . Hypertension   . SHORTNESS OF BREATH 09/04/2009  . CHEST PAIN 09/04/2009    12:01 PM, 02/21/20 Mearl Latin PT, DPT Physical Therapist at [facility]  [place] [address] [place], [place], [address] Phone: [phone]   Fax:  [phone]  Name: Shannon [name] MRN: [id number] Date of Birth: 1966/05/20

## 2020-02-23 ENCOUNTER — Encounter (HOSPITAL_COMMUNITY): Payer: Self-pay | Admitting: Physical Therapy

## 2020-02-23 ENCOUNTER — Other Ambulatory Visit: Payer: Self-pay

## 2020-02-23 ENCOUNTER — Ambulatory Visit (HOSPITAL_COMMUNITY): Payer: Medicare HMO | Admitting: Physical Therapy

## 2020-02-23 DIAGNOSIS — R2689 Other abnormalities of gait and mobility: Secondary | ICD-10-CM | POA: Diagnosis not present

## 2020-02-23 DIAGNOSIS — R29898 Other symptoms and signs involving the musculoskeletal system: Secondary | ICD-10-CM | POA: Diagnosis not present

## 2020-02-23 DIAGNOSIS — M545 Low back pain, unspecified: Secondary | ICD-10-CM

## 2020-02-23 DIAGNOSIS — M6281 Muscle weakness (generalized): Secondary | ICD-10-CM | POA: Diagnosis not present

## 2020-02-23 NOTE — Therapy (Signed)
[place], [place], [address] Phone: [phone]   Fax:  [phone]  Physical Therapy Treatment  Patient Details  Name: RADIA FALSO MRN: [id number] Date of Birth: 1965/12/12 Referring Provider (PT): Levy Pupa PA-C   Encounter Date: 02/23/2020  PT End of Session - 02/23/20 1042    Visit Number  2    Number of Visits  12    Date for PT Re-Evaluation  04/03/20    Authorization Type  [employer]    Authorization Time Period  02/22/20-04/03/20    Authorization - Visit Number  2    Authorization - Number of Visits  12    Progress Note Due on Visit  10    PT Start Time  1045   arrived late   PT Stop Time  1115    PT Time Calculation (min)  30 min    Activity Tolerance  Patient limited by pain    Behavior During Therapy  Restless;Agitated       Past Medical History:  Diagnosis Date  . Anemia   . Anxiety   . Arthritis    oa needs hip replacement on right  . Cancer ([place])    squamous cell areas removed from vulva and pre caner areas removed from leg   . Carotid artery occlusion   . Chronic diastolic HF (heart failure) ([place])    pt denies  . COPD (chronic obstructive pulmonary disease) ([place])    pt denies  . Coronary artery disease    LHC 11/25/12 90% stenosis mid LCx & otherwise nonobstructive dz w/ EF 60-65% S/p PTCA/DES to LCx  . Depression   . Elevated cholesterol   . Exertional dyspnea    chronic  . GERD (gastroesophageal reflux disease)   . History of cardiovascular stress test 06/2015   low risk  . HPV in female   . Hyperlipidemia   . Hypertension   . MI (myocardial infarction) ([place]) fe 17, 2014  . Obesity   . Polycystic ovary disease   . Skin tear of upper arm without complication, left, sequela    small skin tear left upper arm no drainage for 1 week  . Tobacco abuse     Past Surgical History:  Procedure Laterality Date  . CORONARY ANGIOPLASTY WITH STENT PLACEMENT    . LEFT HEART  CATH AND CORONARY ANGIOGRAPHY N/A 08/09/2018   Procedure: LEFT HEART CATH AND CORONARY ANGIOGRAPHY;  Surgeon: Nelva Bush, MD;  Location: [place] [facility];  Service: Cardiovascular;  Laterality: N/A;  . LEFT HEART CATHETERIZATION WITH CORONARY ANGIOGRAM N/A 11/15/2012   Procedure: LEFT HEART CATHETERIZATION WITH CORONARY ANGIOGRAM;  Surgeon: Thayer Headings, MD;  [facility] C[facility] LAB;  Service: Cardiovascular;  Laterality: N/A;  . PERCUTANEOUS CORONARY STENT INTERVENTION (PCI-S)  11/15/2012   Procedure: PERCUTANEOUS CORONARY STENT INTERVENTION (PCI-S);  Surgeon: Thayer Headings, MD;  Location: [facility] CATH LAB;  Service: Cardiovascular;;  . skin cancer removed right lower leg Right   . TONSILLECTOMY    . uterus ablation    . VULVECTOMY N/A 11/08/2019   Procedure: excision of VULVA, vulvoscopy;  Surgeon: Dian Queen, MD;  Location: [facility];  Service: Gynecology;  Laterality: N/A;    There were no vitals filed for this visit.  Subjective Assessment - 02/23/20 1049    Subjective  Patient says she is having a lot of pain today. Says she had to take extra pain medication after evaluation last time.  Says she can't do much and doesn't tolerate exercise until she gets "hands on" treatment for pain.    Pertinent History  blocked carotid artery, chronic LBP, hip pain    Limitations  Sitting;Lifting;Standing;Walking;House hold activities    How long can you stand comfortably?  unable    How long can you walk comfortably?  unable    Patient Stated Goals  get out of some of the pain.    Currently in Pain?  Yes    Pain Score  8     Pain Location  Back    Pain Orientation  Lower;Posterior    Pain Descriptors / Indicators  Aching;Dull;Sore    Pain Type  Chronic pain    Aggravating Factors   movement    Pain Relieving Factors  heat    Effect of Pain on Daily Activities  Limits                        [facility] Adult PT Treatment/Exercise - 02/23/20 0001       Exercises   Exercises  Lumbar      Lumbar Exercises: Seated   Other Seated Lumbar Exercises  ab set 10 x 5" hold       Manual Therapy   Manual Therapy  Soft tissue mobilization    Manual therapy comments  Manual treatmetn performed independant of other activity     Soft tissue mobilization  IASTM to bilateral lumbar paraspinals, patient in prone              PT Education - 02/23/20 1359    Education Details  on POC, pain management tecnhiques and HEP    Person(s) Educated  Patient    Methods  Explanation    Comprehension  Verbalized understanding       PT Short Term Goals - 02/21/20 1157      PT SHORT TERM GOAL #1   Title  Patient will be independent with HEP in order to improve functional outcomes.    Time  3    Period  Weeks    Status  New    Target Date  03/13/20      PT SHORT TERM GOAL #2   Title  Patient will report at least 25% improvement in symptoms for improved quality of life.    Time  3    Period  Weeks    Status  New    Target Date  03/13/20        PT Long Term Goals - 02/21/20 1158      PT LONG TERM GOAL #1   Title  Patient will report at least 75% improvement in symptoms for improved quality of life.    Time  6    Period  Weeks    Status  New    Target Date  04/03/20      PT LONG TERM GOAL #2   Title  Patient will improve FOTO score by at least 5 points in order to indicate improved tolerance to activity.    Time  6    Period  Weeks    Status  New    Target Date  04/03/20      PT LONG TERM GOAL #3   Title  Patient will be able to complete 5x STS in under 11.4 seconds in order to reduce the risk of falls.    Time  6    Period  Weeks    Status  New    Target Date  04/03/20      PT LONG TERM GOAL #4   Title  Patient will demonstrate at least 25% improvement in lumbar ROM in all planes for improved ability to move trunk for housework.    Time  6    Period  Weeks    Status  New    Target Date  04/03/20            Plan -  02/23/20 1407    Clinical Impression Statement  Patient presents pain limited today, demonstrating some level of frustration with current level of pain. Patient verbalized she is unable to participate in exercise component until she has pain level addressed. Patient educated on therapy POC and IASTM for addressing lumbar pain and restriction to reduce current symptoms. Patient tolerated this well and noted slight reduction in pain post treatment. Educated patient on addition of ab set in most comfortable position (sitting, laying etc) for initiating core strengthening to assist with reduction of lumbar pain. Patient will continue to benefit from skilled therapy services to address pain and restriction in lumbar spine, and to progress core strengthening to improve LOF with ADLs.    Personal Factors and Comorbidities  Comorbidity 3+;Fitness;Past/Current Experience;Time since onset of injury/illness/exacerbation;Social Background    Comorbidities  anxiety, depression, HTN, carotid artery blockage on L, chronic LBP, chronic hip pain, sedentary lifestyle    Examination-Activity Limitations  Bathing;Bed Mobility;Bend;Caring for Others;Carry;Continence;Dressing;Hygiene/Grooming;Lift;Locomotion Level;Sit;Squat;Stairs;Stand;Transfers    Examination-Participation Restrictions  Cleaning;Church;Community Activity;Driving;Meal Prep;Shop;Volunteer;Yard Work    [employer] officer  Evolving/Moderate complexity    Rehab Potential  Fair    PT Frequency  2x / week    PT Duration  6 weeks    PT Treatment/Interventions  ADLs/Self Care Home Management;Aquatic Therapy;Biofeedback;Cryotherapy;Electrical Stimulation;Iontophoresis 4mg /ml Dexamethasone;Moist Heat;Traction;Ultrasound;DME Instruction;Gait training;Stair training;Functional mobility training;Therapeutic activities;Therapeutic exercise;Balance training;Neuromuscular re-education;Patient/family education;Orthotic Fit/Training;Manual techniques;Manual  lymph drainage;Passive range of motion;Dry needling;Energy conservation;Splinting;Taping;Spinal Manipulations;Joint Manipulations    PT Next Visit Plan  Assess response to manual treatment and HEP. Continue manual as indicated and progress table core strengthening/ pain free mobility as tolerated.    PT Home Exercise Plan  02/23/20: ab set    Consulted and Agree with Plan of Care  Patient       Patient will benefit from skilled therapeutic intervention in order to improve the following deficits and impairments:  Abnormal gait, Decreased activity tolerance, Decreased balance, Decreased mobility, Decreased endurance, Decreased range of motion, Decreased strength, Difficulty walking, Increased muscle spasms, Impaired perceived functional ability, Impaired sensation, Improper body mechanics, Pain  Visit Diagnosis: Low back pain, unspecified back pain laterality, unspecified chronicity, unspecified whether sciatica present  Muscle weakness (generalized)  Other abnormalities of gait and mobility  Other symptoms and signs involving the musculoskeletal system     Problem List Patient Active Problem List   Diagnosis Date Noted  . Chronic diastolic HF (heart failure) ([place]) 08/10/2018  . GERD (gastroesophageal reflux disease) 08/09/2018  . Unstable angina ([place]) 08/09/2018  . Carotid artery disease ([place]) 07/24/2017  . AAA (abdominal aortic aneurysm) without rupture ([place]) 01/05/2013  . Other malaise and fatigue 01/05/2013  . Palpitations 01/05/2013  . Tobacco use disorder 12/02/2012  . Coronary artery disease   . Hyperlipidemia   . Hypertension   . SHORTNESS OF BREATH 09/04/2009  . CHEST PAIN 09/04/2009   2:10 PM, 02/23/20 Josue Hector PT DPT  Physical Therapist with [facility]  [phone]   [facility]  [place], [place], [address] Phone: [phone]   Fax:  [phone]  Name: Ir[place]on SANDOVAL MRN:  [id number] Date of Birth: Feb 26, 1966

## 2020-02-29 ENCOUNTER — Other Ambulatory Visit: Payer: Self-pay

## 2020-02-29 ENCOUNTER — Encounter (HOSPITAL_COMMUNITY): Payer: Self-pay

## 2020-02-29 ENCOUNTER — Ambulatory Visit (HOSPITAL_COMMUNITY): Payer: Medicare HMO | Attending: Chiropractic Medicine

## 2020-02-29 DIAGNOSIS — R29898 Other symptoms and signs involving the musculoskeletal system: Secondary | ICD-10-CM | POA: Diagnosis not present

## 2020-02-29 DIAGNOSIS — M6281 Muscle weakness (generalized): Secondary | ICD-10-CM

## 2020-02-29 DIAGNOSIS — R2689 Other abnormalities of gait and mobility: Secondary | ICD-10-CM | POA: Insufficient documentation

## 2020-02-29 DIAGNOSIS — M545 Low back pain, unspecified: Secondary | ICD-10-CM

## 2020-02-29 NOTE — Patient Instructions (Signed)
Bridge    Lie back, legs bent. Inhale, pressing hips up. Keeping ribs in, lengthen lower back. Exhale, rolling down along spine from top. Repeat 5-10 times. Do 1-2 sessions per day.  http://pm.exer.us/55   Copyright  VHI. All rights reserved. ..   Lower Trunk Rotation Stretch    Keeping back flat and feet together, rotate knees to left side. Hold 10 seconds. Repeat 5 times per set. Do 2 sets per session. Do 2 sessions per day.  http://orth.exer.us/123   Copyright  VHI. All rights reserved.    20 cm lumbar wedge

## 2020-02-29 NOTE — Therapy (Signed)
[place], [place], [address] Phone: [phone]   Fax:  [phone]  Physical Therapy Treatment  Patient Details  Name: Shannon [name] MRN: [id number] Date of Birth: [birth year] Referring Provider (PT): Levy Pupa PA-C   Encounter Date: 02/29/2020  PT End of Session - 02/29/20 1542    Visit Number  3    Number of Visits  12    Date for PT Re-Evaluation  04/03/20    Authorization Type  [employer]    Authorization Time Period  02/22/20-04/03/20    Authorization - Visit Number  3    Authorization - Number of Visits  12    Progress Note Due on Visit  10    PT Start Time  1537    PT Stop Time  1618    PT Time Calculation (min)  41 min    Activity Tolerance  Patient limited by pain    Behavior During Therapy  Restless;Agitated;WFL for tasks assessed/performed       Past Medical History:  Diagnosis Date  . Anemia   . Anxiety   . Arthritis    oa needs hip replacement on right  . Cancer ([place])    squamous cell areas removed from vulva and pre caner areas removed from leg   . Carotid artery occlusion   . Chronic diastolic HF (heart failure) ([place])    pt denies  . COPD (chronic obstructive pulmonary disease) ([place])    pt denies  . Coronary artery disease    LHC 11/25/12 90% stenosis mid LCx & otherwise nonobstructive dz w/ EF 60-65% S/p PTCA/DES to LCx  . Depression   . Elevated cholesterol   . Exertional dyspnea    chronic  . GERD (gastroesophageal reflux disease)   . History of cardiovascular stress test 06/2015   low risk  . HPV in female   . Hyperlipidemia   . Hypertension   . MI (myocardial infarction) ([place]) fe 17, 2014  . Obesity   . Polycystic ovary disease   . Skin tear of upper arm without complication, left, sequela    small skin tear left upper arm no drainage for 1 week  . Tobacco abuse     Past Surgical History:  Procedure Laterality Date  . CORONARY ANGIOPLASTY WITH STENT PLACEMENT     . LEFT HEART CATH AND CORONARY ANGIOGRAPHY N/A 08/09/2018   Procedure: LEFT HEART CATH AND CORONARY ANGIOGRAPHY;  Surgeon: Nelva Bush, MD;  Location: [place] [facility];  Service: Cardiovascular;  Laterality: N/A;  . LEFT HEART CATHETERIZATION WITH CORONARY ANGIOGRAM N/A 11/15/2012   Procedure: LEFT HEART CATHETERIZATION WITH CORONARY ANGIOGRAM;  Surgeon: Thayer Headings, MD;  Location: [facility] CATH LAB;  Service: Cardiovascular;  Laterality: N/A;  . PERCUTANEOUS CORONARY STENT INTERVENTION (PCI-S)  11/15/2012   Procedure: PERCUTANEOUS CORONARY STENT INTERVENTION (PCI-S);  Surgeon: Thayer Headings, MD;  Location: [facility] CATH LAB;  Service: Cardiovascular;;  . skin cancer removed right lower leg Right   . TONSILLECTOMY    . uterus ablation    . VULVECTOMY N/A 11/08/2019   Procedure: excision of VULVA, vulvoscopy;  Surgeon: Dian Queen, MD;  Location: [facility];  Service: Gynecology;  Laterality: N/A;    There were no vitals filed for this visit.  Subjective Assessment - 02/29/20 1540    Subjective  Pt reoprts she continues to have a lot of LBP, pain scale 8/10 Rt> Lt.  Feels LBP related to need for  Rt hip replacement.  Has began ab sets at home.  Plans on shots Rt hip tomorrow.    Pertinent History  blocked carotid artery, chronic LBP, hip pain    Patient Stated Goals  get out of some of the pain.    Currently in Pain?  Yes    Pain Score  8     Pain Location  Back    Pain Orientation  Lower;Right;Left    Pain Descriptors / Indicators  Aching;Tightness;Sore    Pain Type  Chronic pain    Pain Onset  More than a month ago    Pain Frequency  Constant    Aggravating Factors   movement    Pain Relieving Factors  heat; laying on side    Effect of Pain on Daily Activities  limits                        [facility] Adult PT Treatment/Exercise - 02/29/20 0001      Posture/Postural Control   Posture/Postural Control  Postural limitations    Postural Limitations   Forward head;Rounded Shoulders;Decreased lumbar lordosis    Posture Comments  slouched posture in seated      Exercises   Exercises  Lumbar      Lumbar Exercises: Stretches   Lower Trunk Rotation  5 reps;10 seconds    Lower Trunk Rotation Limitations  improved tolerance wiht reps      Lumbar Exercises: Supine   Bridge  5 reps    Bridge Limitations  2 sets      Lumbar Exercises: Prone   Other Prone Lumbar Exercises  heel squeeze 5x 5"      Manual Therapy   Manual Therapy  Soft tissue mobilization    Manual therapy comments  Manual treatmetn performed independant of other activity     Soft tissue mobilization  Prone: Lumbar paraspinals, QL, gluteal                PT Short Term Goals - 02/21/20 1157      PT SHORT TERM GOAL #1   Title  Patient will be independent with HEP in order to improve functional outcomes.    Time  3    Period  Weeks    Status  New    Target Date  03/13/20      PT SHORT TERM GOAL #2   Title  Patient will report at least 25% improvement in symptoms for improved quality of life.    Time  3    Period  Weeks    Status  New    Target Date  03/13/20        PT Long Term Goals - 02/21/20 1158      PT LONG TERM GOAL #1   Title  Patient will report at least 75% improvement in symptoms for improved quality of life.    Time  6    Period  Weeks    Status  New    Target Date  04/03/20      PT LONG TERM GOAL #2   Title  Patient will improve FOTO score by at least 5 points in order to indicate improved tolerance to activity.    Time  6    Period  Weeks    Status  New    Target Date  04/03/20      PT LONG TERM GOAL #3   Title  Patient will be able to complete 5x STS  in under 11.4 seconds in order to reduce the risk of falls.    Time  6    Period  Weeks    Status  New    Target Date  04/03/20      PT LONG TERM GOAL #4   Title  Patient will demonstrate at least 25% improvement in lumbar ROM in all planes for improved ability to move trunk for  housework.    Time  6    Period  Weeks    Status  New    Target Date  04/03/20            Plan - 02/29/20 1911    Clinical Impression Statement  Pt continues to be limited by pain, demonstrates some level of frustation due to current level of pain.  Sessoin focus on lumber mobility, core and proximal strenghtenig and manual soft tissue mobilization to address restrictions.  Pt reports vast improvements following manual with decreased pain and improved mobility.  Added bridges and LTR to HEP.    Personal Factors and Comorbidities  Comorbidity 3+;Fitness;Past/Current Experience;Time since onset of injury/illness/exacerbation;Social Background    Comorbidities  anxiety, depression, HTN, carotid artery blockage on L, chronic LBP, chronic hip pain, sedentary lifestyle    Examination-Activity Limitations  Bathing;Bed Mobility;Bend;Caring for Others;Carry;Continence;Dressing;Hygiene/Grooming;Lift;Locomotion Level;Sit;Squat;Stairs;Stand;Transfers    Examination-Participation Restrictions  Cleaning;Church;Community Activity;Driving;Meal Prep;Shop;Volunteer;Yard Work    [employer] officer  Evolving/Moderate complexity    Clinical Decision Making  Moderate    Rehab Potential  Fair    PT Frequency  2x / week    PT Duration  6 weeks    PT Treatment/Interventions  ADLs/Self Care Home Management;Aquatic Therapy;Biofeedback;Cryotherapy;Electrical Stimulation;Iontophoresis 4mg /ml Dexamethasone;Moist Heat;Traction;Ultrasound;DME Instruction;Gait training;Stair training;Functional mobility training;Therapeutic activities;Therapeutic exercise;Balance training;Neuromuscular re-education;Patient/family education;Orthotic Fit/Training;Manual techniques;Manual lymph drainage;Passive range of motion;Dry needling;Energy conservation;Splinting;Taping;Spinal Manipulations;Joint Manipulations    PT Next Visit Plan  Assess response to manual treatment and HEP. Continue manual as indicated and progress  table core strengthening/ pain free mobility as tolerated.    PT Home Exercise Plan  02/23/20: ab set; 6/2: LTR and bridges       Patient will benefit from skilled therapeutic intervention in order to improve the following deficits and impairments:  Abnormal gait, Decreased activity tolerance, Decreased balance, Decreased mobility, Decreased endurance, Decreased range of motion, Decreased strength, Difficulty walking, Increased muscle spasms, Impaired perceived functional ability, Impaired sensation, Improper body mechanics, Pain  Visit Diagnosis: Muscle weakness (generalized)  Other abnormalities of gait and mobility  Other symptoms and signs involving the musculoskeletal system  Low back pain, unspecified back pain laterality, unspecified chronicity, unspecified whether sciatica present     Problem List Patient Active Problem List   Diagnosis Date Noted  . Chronic diastolic HF (heart failure) ([place]) 08/10/2018  . GERD (gastroesophageal reflux disease) 08/09/2018  . Unstable angina ([place]) 08/09/2018  . Carotid artery disease ([place]) 07/24/2017  . AAA (abdominal aortic aneurysm) without rupture ([place]) 01/05/2013  . Other malaise and fatigue 01/05/2013  . Palpitations 01/05/2013  . Tobacco use disorder 12/02/2012  . Coronary artery disease   . Hyperlipidemia   . Hypertension   . SHORTNESS OF BREATH 09/04/2009  . CHEST PAIN 09/04/2009   Ihor Austin, LPTA/CLT; CBIS [phone]  Aldona Lento 02/29/2020, 7:15 PM  [place], [place], [address] Phone: [phone]   Fax:  [phone]  Name: Shannon [name] MRN: [id number] Date of Birth: 31-Dec-1965

## 2020-03-01 DIAGNOSIS — M25551 Pain in right hip: Secondary | ICD-10-CM | POA: Diagnosis not present

## 2020-03-01 DIAGNOSIS — M5136 Other intervertebral disc degeneration, lumbar region: Secondary | ICD-10-CM | POA: Diagnosis not present

## 2020-03-06 ENCOUNTER — Ambulatory Visit: Payer: Medicare HMO | Admitting: Dermatology

## 2020-03-06 ENCOUNTER — Encounter: Payer: Self-pay | Admitting: Dermatology

## 2020-03-06 ENCOUNTER — Other Ambulatory Visit: Payer: Self-pay

## 2020-03-06 DIAGNOSIS — D225 Melanocytic nevi of trunk: Secondary | ICD-10-CM | POA: Diagnosis not present

## 2020-03-06 DIAGNOSIS — F17210 Nicotine dependence, cigarettes, uncomplicated: Secondary | ICD-10-CM | POA: Diagnosis not present

## 2020-03-06 DIAGNOSIS — D485 Neoplasm of uncertain behavior of skin: Secondary | ICD-10-CM | POA: Diagnosis not present

## 2020-03-06 DIAGNOSIS — Z85828 Personal history of other malignant neoplasm of skin: Secondary | ICD-10-CM | POA: Diagnosis not present

## 2020-03-06 DIAGNOSIS — I6523 Occlusion and stenosis of bilateral carotid arteries: Secondary | ICD-10-CM | POA: Diagnosis not present

## 2020-03-06 DIAGNOSIS — B078 Other viral warts: Secondary | ICD-10-CM | POA: Diagnosis not present

## 2020-03-06 DIAGNOSIS — D229 Melanocytic nevi, unspecified: Secondary | ICD-10-CM

## 2020-03-06 NOTE — Patient Instructions (Signed)

## 2020-03-07 ENCOUNTER — Encounter (HOSPITAL_COMMUNITY): Payer: Self-pay

## 2020-03-07 ENCOUNTER — Ambulatory Visit (HOSPITAL_COMMUNITY): Payer: Medicare HMO

## 2020-03-07 DIAGNOSIS — M6281 Muscle weakness (generalized): Secondary | ICD-10-CM

## 2020-03-07 DIAGNOSIS — R2689 Other abnormalities of gait and mobility: Secondary | ICD-10-CM

## 2020-03-07 DIAGNOSIS — M545 Low back pain, unspecified: Secondary | ICD-10-CM

## 2020-03-07 DIAGNOSIS — R29898 Other symptoms and signs involving the musculoskeletal system: Secondary | ICD-10-CM | POA: Diagnosis not present

## 2020-03-07 NOTE — Patient Instructions (Signed)
Deep breathing!  Isometric Abdominal Contraction   Pair with breathing, exhale and tighten stomach. Tuck in stomach muscles and push low back against back of chair.  Hold 5 seconds while counting out loud. Repeat 10 times. Do 2 sessions per day.  http://gt2.exer.us/624   Copyright  VHI. All rights reserved.   Isometric Abdominal    Lying on back with knees bent, tighten stomach by pressing elbows down. Hold 5 seconds. Pair with breathing, exhale and tighten stomach. Repeat 10 times per set. Do 2 sets per session. Do 2 sessions per day.  http://orth.exer.us/1087   Copyright  VHI. All rights reserved.

## 2020-03-07 NOTE — Therapy (Signed)
[place], [place], [address] Phone: [phone]   Fax:  [phone]  Physical Therapy Treatment  Patient Details  Name: Shannon [name] MRN: [id number] Date of Birth: 09/08/1966 Referring Provider (PT): Levy Pupa PA-C   Encounter Date: 03/07/2020  PT End of Session - 03/07/20 1627    Visit Number  4    Number of Visits  12    Date for PT Re-Evaluation  04/03/20    Authorization Type  [employer] 12 visits approved    Authorization Time Period  02/22/20-04/03/20    Authorization - Visit Number  4    Authorization - Number of Visits  12    Progress Note Due on Visit  10    PT Start Time  1620    PT Stop Time  1703    PT Time Calculation (min)  43 min    Activity Tolerance  Patient limited by pain;No increased pain    Behavior During Therapy  WFL for tasks assessed/performed       Past Medical History:  Diagnosis Date  . Anemia   . Anxiety   . Arthritis    oa needs hip replacement on right  . Cancer ([place])    squamous cell areas removed from vulva and pre caner areas removed from leg   . Carotid artery occlusion   . Chronic diastolic HF (heart failure) ([place])    pt denies  . COPD (chronic obstructive pulmonary disease) ([place])    pt denies  . Coronary artery disease    LHC 11/25/12 90% stenosis mid LCx & otherwise nonobstructive dz w/ EF 60-65% S/p PTCA/DES to LCx  . Depression   . Elevated cholesterol   . Exertional dyspnea    chronic  . GERD (gastroesophageal reflux disease)   . History of cardiovascular stress test 06/2015   low risk  . HPV in female   . Hyperlipidemia   . Hypertension   . MI (myocardial infarction) ([place]) fe 17, 2014  . Obesity   . Polycystic ovary disease   . Skin tear of upper arm without complication, left, sequela    small skin tear left upper arm no drainage for 1 week  . Tobacco abuse     Past Surgical History:  Procedure Laterality Date  . CORONARY ANGIOPLASTY WITH  STENT PLACEMENT    . LEFT HEART CATH AND CORONARY ANGIOGRAPHY N/A 08/09/2018   Procedure: LEFT HEART CATH AND CORONARY ANGIOGRAPHY;  Surgeon: Nelva Bush, MD;  Location: [facility];  Service: Cardiovascular;  Laterality: N/A;  . LEFT HEART CATHETERIZATION WITH CORONARY ANGIOGRAM N/A 11/15/2012   Procedure: LEFT HEART CATHETERIZATION WITH CORONARY ANGIOGRAM;  Surgeon: Thayer Headings, MD;  Location: [facility] CATH LAB;  Service: Cardiovascular;  Laterality: N/A;  . PERCUTANEOUS CORONARY STENT INTERVENTION (PCI-S)  11/15/2012   Procedure: PERCUTANEOUS CORONARY STENT INTERVENTION (PCI-S);  Surgeon: Thayer Headings, MD;  Location: [facility] CATH LAB;  Service: Cardiovascular;;  . skin cancer removed right lower leg Right   . TONSILLECTOMY    . uterus ablation    . VULVECTOMY N/A 11/08/2019   Procedure: excision of VULVA, vulvoscopy;  Surgeon: Dian Queen, MD;  Location: [facility];  Service: Gynecology;  Laterality: N/A;    There were no vitals filed for this visit.  Subjective Assessment - 03/07/20 1623    Subjective  Pt reports she felt looser following manual last session, current pain scale 7-8/10 LBP.  Stated she  has been to MD apts and increased discomfort due to sitting long periods of time.  Has received injection into Rt hip since last apt here.  Reports she has been sleeping in bed more regularly.    Pertinent History  blocked carotid artery, chronic LBP, hip pain    Patient Stated Goals  get out of some of the pain.    Currently in Pain?  Yes    Pain Score  8     Pain Location  Back    Pain Orientation  Lower   Rt > Lt   Pain Descriptors / Indicators  Aching;Sore;Tightness    Pain Type  Chronic pain    Pain Onset  More than a month ago    Pain Frequency  Constant    Aggravating Factors   movement, sitting long duration    Pain Relieving Factors  heat; laying on side    Effect of Pain on Daily Activities  limits                        [facility]  Adult PT Treatment/Exercise - 03/07/20 0001      Posture/Postural Control   Posture/Postural Control  Postural limitations    Postural Limitations  Forward head;Rounded Shoulders;Decreased lumbar lordosis    Posture Comments  slouched posture in seated      Exercises   Exercises  Lumbar      Lumbar Exercises: Stretches   Lower Trunk Rotation  10 seconds    Lower Trunk Rotation Limitations  10x 10"    Prone on Elbows Stretch  1 rep    Prone on Elbows Stretch Limitations  2 min    Press Ups  5 reps;10 seconds      Lumbar Exercises: Seated   Other Seated Lumbar Exercises  ab set 10 x 5" hold       Lumbar Exercises: Supine   Ab Set  10 reps;3 seconds    Bridge  10 reps;3 seconds      Lumbar Exercises: Prone   Other Prone Lumbar Exercises  heel squeeze 5x 5"      Manual Therapy   Manual Therapy  Soft tissue mobilization    Manual therapy comments  Manual treatmetn performed independant of other activity     Soft tissue mobilization  Prone: Lumbar paraspinals, QL, gluteal                PT Short Term Goals - 02/21/20 1157      PT SHORT TERM GOAL #1   Title  Patient will be independent with HEP in order to improve functional outcomes.    Time  3    Period  Weeks    Status  New    Target Date  03/13/20      PT SHORT TERM GOAL #2   Title  Patient will report at least 25% improvement in symptoms for improved quality of life.    Time  3    Period  Weeks    Status  New    Target Date  03/13/20        PT Long Term Goals - 02/21/20 1158      PT LONG TERM GOAL #1   Title  Patient will report at least 75% improvement in symptoms for improved quality of life.    Time  6    Period  Weeks    Status  New    Target Date  04/03/20  PT LONG TERM GOAL #2   Title  Patient will improve FOTO score by at least 5 points in order to indicate improved tolerance to activity.    Time  6    Period  Weeks    Status  New    Target Date  04/03/20      PT LONG TERM GOAL #3    Title  Patient will be able to complete 5x STS in under 11.4 seconds in order to reduce the risk of falls.    Time  6    Period  Weeks    Status  New    Target Date  04/03/20      PT LONG TERM GOAL #4   Title  Patient will demonstrate at least 25% improvement in lumbar ROM in all planes for improved ability to move trunk for housework.    Time  6    Period  Weeks    Status  New    Target Date  04/03/20            Plan - 03/07/20 1658    Clinical Impression Statement  Continued session focus with lumbar mobility and manual for pain control.  Focus on extension based exercises for mobility and pain control.  Pt tolerated well with POE.  Pt educated on diaphragmatic breathing for mobiltiy as well as importance of core strengthening, cueing to pair with breathing to reduce holding breath.  Manual STM complete to lumbar paraspinals, erectors, QL and gluteus to reduce tightness.  Increased tenderness noted over Rt PSIS, assess SI alignment next session.  Pt reports reduced pain to 5/10 at EOS.  Pt presents with improved seated posture wihtout cueing following manual.  Given additional diaphragmatic breathing and isometric ab sets to HEP, printout given for improved follow thru.    Personal Factors and Comorbidities  Comorbidity 3+;Fitness;Past/Current Experience;Time since onset of injury/illness/exacerbation;Social Background    Comorbidities  anxiety, depression, HTN, carotid artery blockage on L, chronic LBP, chronic hip pain, sedentary lifestyle    Examination-Activity Limitations  Bathing;Bed Mobility;Bend;Caring for Others;Carry;Continence;Dressing;Hygiene/Grooming;Lift;Locomotion Level;Sit;Squat;Stairs;Stand;Transfers    Examination-Participation Restrictions  Cleaning;Church;Community Activity;Driving;Meal Prep;Shop;Volunteer;Yard Work    [employer]chant [employer] officer  Evolving/Moderate complexity    Clinical Decision Making  Moderate    Rehab Potential  Fair    PT Frequency   2x / week    PT Duration  6 weeks    PT Treatment/Interventions  ADLs/Self Care Home Management;Aquatic Therapy;Biofeedback;Cryotherapy;Electrical Stimulation;Iontophoresis '4mg'$ /ml Dexamethasone;Moist Heat;Traction;Ultrasound;DME Instruction;Gait training;Stair training;Functional mobility training;Therapeutic activities;Therapeutic exercise;Balance training;Neuromuscular re-education;Patient/family education;Orthotic Fit/Training;Manual techniques;Manual lymph drainage;Passive range of motion;Dry needling;Energy conservation;Splinting;Taping;Spinal Manipulations;Joint Manipulations    PT Next Visit Plan  Assess extension based exercises and response to manual treatment.  Check SI with MET PRN.  Core strengthening and pain free mobility.    PT Home Exercise Plan  02/23/20: ab set; 6/2: LTR and bridges; 03/07/20: diaphragmatic breathing.       Patient will benefit from skilled therapeutic intervention in order to improve the following deficits and impairments:  Abnormal gait, Decreased activity tolerance, Decreased balance, Decreased mobility, Decreased endurance, Decreased range of motion, Decreased strength, Difficulty walking, Increased muscle spasms, Impaired perceived functional ability, Impaired sensation, Improper body mechanics, Pain  Visit Diagnosis: Muscle weakness (generalized)  Other abnormalities of gait and mobility  Other symptoms and signs involving the musculoskeletal system  Low back pain, unspecified back pain laterality, unspecified chronicity, unspecified whether sciatica present     Problem List Patient Active Problem List   Diagnosis Date Noted  .  Chronic diastolic HF (heart failure) ([place]) 08/10/2018  . GERD (gastroesophageal reflux disease) 08/09/2018  . Unstable angina ([place]) 08/09/2018  . Carotid artery disease ([place]) 07/24/2017  . AAA (abdominal aortic aneurysm) without rupture ([place]) 01/05/2013  . Other malaise and fatigue 01/05/2013  . Palpitations 01/05/2013  .  Tobacco use disorder 12/02/2012  . Coronary artery disease   . Hyperlipidemia   . Hypertension   . SHORTNESS OF BREATH 09/04/2009  . CHEST PAIN 09/04/2009   Ihor Austin, LPTA/CLT; CBIS [phone]  Aldona Lento 03/07/2020, 6:33 PM  [place], [place], [address] Phone: [phone]   Fax:  [phone]  Name: Shannon [name] MRN: [id number] Date of Birth: [birth year]

## 2020-03-09 ENCOUNTER — Telehealth (HOSPITAL_COMMUNITY): Payer: Self-pay

## 2020-03-09 ENCOUNTER — Ambulatory Visit (HOSPITAL_COMMUNITY): Payer: Medicare HMO

## 2020-03-09 NOTE — Telephone Encounter (Signed)
pt had an emergency come up and had to cancel

## 2020-03-12 ENCOUNTER — Encounter (HOSPITAL_COMMUNITY): Payer: Medicare HMO | Admitting: Physical Therapy

## 2020-03-13 ENCOUNTER — Other Ambulatory Visit: Payer: Self-pay

## 2020-03-13 ENCOUNTER — Ambulatory Visit (HOSPITAL_COMMUNITY): Payer: Medicare HMO

## 2020-03-13 ENCOUNTER — Encounter (HOSPITAL_COMMUNITY): Payer: Self-pay

## 2020-03-13 DIAGNOSIS — R2689 Other abnormalities of gait and mobility: Secondary | ICD-10-CM

## 2020-03-13 DIAGNOSIS — M545 Low back pain, unspecified: Secondary | ICD-10-CM

## 2020-03-13 DIAGNOSIS — M6281 Muscle weakness (generalized): Secondary | ICD-10-CM | POA: Diagnosis not present

## 2020-03-13 DIAGNOSIS — R29898 Other symptoms and signs involving the musculoskeletal system: Secondary | ICD-10-CM | POA: Diagnosis not present

## 2020-03-13 NOTE — Therapy (Signed)
[place], [place], [address] Phone: [phone]   Fax:  [phone]  Physical Therapy Treatment  Patient Details  Name: Shannon [name] MRN: [id number] Date of Birth: 1966/04/24 Referring Provider (PT): Levy Pupa PA-C   Encounter Date: 03/13/2020   PT End of Session - 03/13/20 1426    Visit Number 5    Number of Visits 12    Date for PT Re-Evaluation 04/03/20    Authorization Type [employer] 12 visits approved    Authorization Time Period 02/22/20-04/03/20    Authorization - Visit Number 5    Authorization - Number of Visits 12    Progress Note Due on Visit 10    PT Start Time 1409    PT Stop Time 1448    PT Time Calculation (min) 39 min    Activity Tolerance Patient limited by pain;No increased pain    Behavior During Therapy WFL for tasks assessed/performed           Past Medical History:  Diagnosis Date  . Anemia   . Anxiety   . Arthritis    oa needs hip replacement on right  . Cancer ([place])    squamous cell areas removed from vulva and pre caner areas removed from leg   . Carotid artery occlusion   . Chronic diastolic HF (heart failure) ([place])    pt denies  . COPD (chronic obstructive pulmonary disease) ([place])    pt denies  . Coronary artery disease    LHC 11/25/12 90% stenosis mid LCx & otherwise nonobstructive dz w/ EF 60-65% S/p PTCA/DES to LCx  . Depression   . Elevated cholesterol   . Exertional dyspnea    chronic  . GERD (gastroesophageal reflux disease)   . History of cardiovascular stress test 06/2015   low risk  . HPV in female   . Hyperlipidemia   . Hypertension   . MI (myocardial infarction) ([place]) fe 17, 2014  . Obesity   . Polycystic ovary disease   . Skin tear of upper arm without complication, left, sequela    small skin tear left upper arm no drainage for 1 week  . Tobacco abuse     Past Surgical History:  Procedure Laterality Date  . CORONARY ANGIOPLASTY WITH  STENT PLACEMENT    . LEFT HEART CATH AND CORONARY ANGIOGRAPHY N/A 08/09/2018   Procedure: LEFT HEART CATH AND CORONARY ANGIOGRAPHY;  Surgeon: Nelva Bush, MD;  Location: [facility];  Service: Cardiovascular;  Laterality: N/A;  . LEFT HEART CATHETERIZATION WITH CORONARY ANGIOGRAM N/A 11/15/2012   Procedure: LEFT HEART CATHETERIZATION WITH CORONARY ANGIOGRAM;  Surgeon: Thayer Headings, MD;  Location: [facility] CATH LAB;  Service: Cardiovascular;  Laterality: N/A;  . PERCUTANEOUS CORONARY STENT INTERVENTION (PCI-S)  11/15/2012   Procedure: PERCUTANEOUS CORONARY STENT INTERVENTION (PCI-S);  Surgeon: Thayer Headings, MD;  Location[facility] CATH LAB;  Service: Cardiovascular;;  . skin cancer removed right lower leg Right   . TONSILLECTOMY    . uterus ablation    . VULVECTOMY N/A 11/08/2019   Procedure: excision of VULVA, vulvoscopy;  Surgeon: Dian Queen, MD;  Location: [facility];  Service: Gynecology;  Laterality: N/A;    There were no vitals filed for this visit.   Subjective Assessment - 03/13/20 1411    Subjective Pt reports some relief though feels because you feel better you do more.  Current pain scale 7/10 LBP and Bil hip.  Pertinent History blocked carotid artery, chronic LBP, hip pain    Patient Stated Goals get out of some of the pain.    Currently in Pain? Yes    Pain Score 7     Pain Location Back    Pain Orientation Lower    Pain Descriptors / Indicators Sharp;Aching;Tender;Tightness    Pain Type Chronic pain    Pain Onset More than a month ago    Pain Frequency Constant    Aggravating Factors  movement, sitting long duration    Pain Relieving Factors heat; laying on side    Effect of Pain on Daily Activities limits                             [facility] Adult PT Treatment/Exercise - 03/13/20 0001      Posture/Postural Control   Posture/Postural Control Postural limitations    Postural Limitations Forward head;Rounded Shoulders;Decreased  lumbar lordosis    Posture Comments slouched posture in seated      Exercises   Exercises Lumbar      Lumbar Exercises: Seated   Other Seated Lumbar Exercises pelvic tilt       Lumbar Exercises: Supine   Pelvic Tilt 10 reps;5 seconds    Bent Knee Raise Limitations begin next session    Bridge 5 reps;3 seconds    Bridge Limitations prior and following MET    Other Supine Lumbar Exercises iso abd wiht belt around thigh    Other Supine Lumbar Exercises isometric ball squeeze 5x 5"      Manual Therapy   Manual Therapy Muscle Energy Technique    Manual therapy comments Manual treatmetn performed independant of other activity     Soft tissue mobilization Prone: Lumbar paraspinals, QL, gluteal     Muscle Energy Technique MET for Rt SI anterior rotation                    PT Short Term Goals - 02/21/20 1157      PT SHORT TERM GOAL #1   Title Patient will be independent with HEP in order to improve functional outcomes.    Time 3    Period Weeks    Status New    Target Date 03/13/20      PT SHORT TERM GOAL #2   Title Patient will report at least 25% improvement in symptoms for improved quality of life.    Time 3    Period Weeks    Status New    Target Date 03/13/20             PT Long Term Goals - 02/21/20 1158      PT LONG TERM GOAL #1   Title Patient will report at least 75% improvement in symptoms for improved quality of life.    Time 6    Period Weeks    Status New    Target Date 04/03/20      PT LONG TERM GOAL #2   Title Patient will improve FOTO score by at least 5 points in order to indicate improved tolerance to activity.    Time 6    Period Weeks    Status New    Target Date 04/03/20      PT LONG TERM GOAL #3   Title Patient will be able to complete 5x STS in under 11.4 seconds in order to reduce the risk of falls.    Time 6  Period Weeks    Status New    Target Date 04/03/20      PT LONG TERM GOAL #4   Title Patient will demonstrate at  least 25% improvement in lumbar ROM in all planes for improved ability to move trunk for housework.    Time 6    Period Weeks    Status New    Target Date 04/03/20                 Plan - 03/13/20 1601    Clinical Impression Statement Checked SI alignment with MET complete for Rt SI anterior rotation.  Pt educated on importance of core strength to assist with SI alignment for pain control.  Tactile and verbal cueing required to improve core activation, best paired wiht breathing to reduce holding breath.  Following MET no LLD and reports of pain reduced to 5/10 (was 7/10 prior manual.)  Pt will benefit from further core strengthening to assist with alignment.    Personal Factors and Comorbidities Comorbidity 3+;Fitness;Past/Current Experience;Time since onset of injury/illness/exacerbation;Social Background    Comorbidities anxiety, depression, HTN, carotid artery blockage on L, chronic LBP, chronic hip pain, sedentary lifestyle    Examination-Activity Limitations Bathing;Bed Mobility;Bend;Caring for Others;Carry;Continence;Dressing;Hygiene/Grooming;Lift;Locomotion Level;Sit;Squat;Stairs;Stand;Transfers    Examination-Participation Restrictions Cleaning;Church;Community Activity;Driving;Meal Prep;Shop;Volunteer;Yard Work    Merchant [employer] officer Evolving/Moderate complexity    Clinical Decision Making Moderate    Rehab Potential Fair    PT Frequency 2x / week    PT Duration 6 weeks    PT Treatment/Interventions ADLs/Self Care Home Management;Aquatic Therapy;Biofeedback;Cryotherapy;Electrical Stimulation;Iontophoresis '4mg'$ /ml Dexamethasone;Moist Heat;Traction;Ultrasound;DME Instruction;Gait training;Stair training;Functional mobility training;Therapeutic activities;Therapeutic exercise;Balance training;Neuromuscular re-education;Patient/family education;Orthotic Fit/Training;Manual techniques;Manual lymph drainage;Passive range of motion;Dry needling;Energy  conservation;Splinting;Taping;Spinal Manipulations;Joint Manipulations    PT Next Visit Plan Continue isometric core, abd and add.  Add bent knee raise wiht ab set.  Check SI with MET PRN.    PT Home Exercise Plan 02/23/20: ab set; 6/2: LTR and bridges; 03/07/20: diaphragmatic breathing.           Patient will benefit from skilled therapeutic intervention in order to improve the following deficits and impairments:  Abnormal gait, Decreased activity tolerance, Decreased balance, Decreased mobility, Decreased endurance, Decreased range of motion, Decreased strength, Difficulty walking, Increased muscle spasms, Impaired perceived functional ability, Impaired sensation, Improper body mechanics, Pain  Visit Diagnosis: Muscle weakness (generalized)  Other abnormalities of gait and mobility  Other symptoms and signs involving the musculoskeletal system  Low back pain, unspecified back pain laterality, unspecified chronicity, unspecified whether sciatica present     Problem List Patient Active Problem List   Diagnosis Date Noted  . Chronic diastolic HF (heart failure) ([place]) 08/10/2018  . GERD (gastroesophageal reflux disease) 08/09/2018  . Unstable angina ([place]) 08/09/2018  . Carotid artery disease ([place]) 07/24/2017  . AAA (abdominal aortic aneurysm) without rupture ([place]) 01/05/2013  . Other malaise and fatigue 01/05/2013  . Palpitations 01/05/2013  . Tobacco use disorder 12/02/2012  . Coronary artery disease   . Hyperlipidemia   . Hypertension   . SHORTNESS OF BREATH 09/04/2009  . CHEST PAIN 09/04/2009   Ihor Austin, LPTA/CLT; CBIS [phone]  Aldona Lento 03/13/2020, 4:07 PM  [place], [place], [address] Phone: [phone]   Fax:  [phone]  Name: ISHANVI MCQUITTY MRN: [id number] Date of Birth: 1966-02-26

## 2020-03-14 ENCOUNTER — Encounter (HOSPITAL_COMMUNITY): Payer: Self-pay

## 2020-03-14 ENCOUNTER — Ambulatory Visit (HOSPITAL_COMMUNITY): Payer: Medicare HMO

## 2020-03-14 DIAGNOSIS — R29898 Other symptoms and signs involving the musculoskeletal system: Secondary | ICD-10-CM

## 2020-03-14 DIAGNOSIS — M545 Low back pain, unspecified: Secondary | ICD-10-CM

## 2020-03-14 DIAGNOSIS — M6281 Muscle weakness (generalized): Secondary | ICD-10-CM

## 2020-03-14 DIAGNOSIS — R2689 Other abnormalities of gait and mobility: Secondary | ICD-10-CM

## 2020-03-14 NOTE — Therapy (Signed)
[facility] [address] [place], [place], [address] Phone: [phone]   Fax:  [phone]  Physical Therapy Treatment  Patient Details  Name: Shannon [name] MRN: [id number] Date of Birth: July 14, 1966 Referring Provider (PT): Su Hoff PA-C   Encounter Date: 03/14/2020   PT End of Session - 03/14/20 1331    Visit Number 6    Number of Visits 12    Date for PT Re-Evaluation 04/03/20    Authorization Type [employer] 12 visits approved    Authorization Time Period 02/22/20-04/03/20    Authorization - Visit Number 6    Authorization - Number of Visits 12    Progress Note Due on Visit 10    PT Start Time 1325   pt late for apt   PT Stop Time 1403    PT Time Calculation (min) 38 min    Activity Tolerance Patient limited by pain;No increased pain    Behavior During Therapy WFL for tasks assessed/performed           Past Medical History:  Diagnosis Date  . Anemia   . Anxiety   . Arthritis    oa needs hip replacement on right  . 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 Carotid artery occlusion   . Chronic diastolic HF (heart failure) (HCC)    pt denies  . COPD (chronic obstructive pulmonary disease) (HCC)    pt denies  . Coronary artery disease    LHC 11/25/12 90% stenosis mid LCx & otherwise nonobstructive dz w/ EF 60-65% S/p PTCA/DES to LCx  . Depression   . Elevated cholesterol   . Exertional dyspnea    chronic  . GERD (gastroesophageal reflux disease)   . History of cardiovascular stress test 06/2015   low risk  . HPV in female   . Hyperlipidemia   . Hypertension   . MI (myocardial infarction) (HCC) fe 17, 2014  . Obesity   . Polycystic ovary disease   . Skin tear of upper arm without complication, left, sequela    small skin tear left upper arm no drainage for 1 week  . Tobacco abuse     Past Surgical History:  Procedure Laterality Date  . CORONARY  ANGIOPLASTY WITH STENT PLACEMENT    . LEFT HEART CATH AND CORONARY ANGIOGRAPHY N/A 08/09/2018   Procedure: LEFT HEART CATH AND CORONARY ANGIOGRAPHY;  Surgeon: Yvonne Kendall, MD;  [facility]: [facility] INVASIVE CV LAB;  Service: Cardiovascular;  Laterality: N/A;  . LEFT HEART CATHETERIZATION WITH CORONARY ANGIOGRAM N/A 11/15/2012   Procedure: LEFT HEART CATHETERIZATION WITH CORONARY ANGIOGRAM;  Surgeon: Vesta Mixer, MD;  Location: [facility] CATH LAB;  Service: Cardiovascular;  Laterality: N/A;  . PERCUTANEOUS CORONARY STENT INTERVENTION (PCI-S)  11/15/2012   Procedure: PERCUTANEOUS CORONARY STENT INTERVENTION (PCI-S);  Surgeon: Vesta Mixer, MD;  Location: [facility] CATH LAB;  Service: Cardiovascular;;  . skin cancer removed right lower leg Right   . TONSILLECTOMY    . uterus ablation    . VULVECTOMY N/A 11/08/2019   Procedure: excision of VULVA, vulvoscopy;  Surgeon: Marcelle Overlie, MD;  Location: [facility];  Service: Gynecology;  Laterality: N/A;    There were no vitals filed for this visit.   Subjective Assessment - 03/14/20 1329    Subjective Pt stated she continues to have soreness LBP and Bil hips.  Does feels her gait mechanics have improved and  improved awareness of posture.    Pertinent History blocked carotid artery, chronic LBP, hip pain    Patient Stated Goals get out of some of the pain.    Currently in Pain? Yes    Pain Score 8     Pain Location Back    Pain Orientation Lower    Pain Descriptors / Indicators Sore    Pain Type Chronic pain    Pain Onset More than a month ago    Pain Frequency Constant    Aggravating Factors  movement, sitting long duration    Pain Relieving Factors heat; laying on side    Effect of Pain on Daily Activities limits                             [facility] Adult PT Treatment/Exercise - 03/14/20 0001      Posture/Postural Control   Posture/Postural Control Postural limitations    Postural Limitations Forward head;Rounded  Shoulders;Decreased lumbar lordosis    Posture Comments improved awareness of posture      Exercises   Exercises Lumbar      Lumbar Exercises: Standing   Other Standing Lumbar Exercises Rockerboard lateral x 1 min      Lumbar Exercises: Seated   Other Seated Lumbar Exercises pelvic tilt       Lumbar Exercises: Supine   Pelvic Tilt 10 reps;5 seconds    Bent Knee Raise 5 reps;5 seconds    Bent Knee Raise Limitations ab set prior    Bridge 5 reps    Other Supine Lumbar Exercises iso abd wiht belt around thigh    Other Supine Lumbar Exercises isometric ball squeeze 5x 5"      Manual Therapy   Manual Therapy Soft tissue mobilization    Manual therapy comments Manual treatmetn performed independant of other activity     Soft tissue mobilization Prone: Lumbar paraspinals, QL, gluteal     Muscle Energy Technique Checked SI, no MET required                    PT Short Term Goals - 02/21/20 1157      PT SHORT TERM GOAL #1   Title Patient will be independent with HEP in order to improve functional outcomes.    Time 3    Period Weeks    Status New    Target Date 03/13/20      PT SHORT TERM GOAL #2   Title Patient will report at least 25% improvement in symptoms for improved quality of life.    Time 3    Period Weeks    Status New    Target Date 03/13/20             PT Long Term Goals - 02/21/20 1158      PT LONG TERM GOAL #1   Title Patient will report at least 75% improvement in symptoms for improved quality of life.    Time 6    Period Weeks    Status New    Target Date 04/03/20      PT LONG TERM GOAL #2   Title Patient will improve FOTO score by at least 5 points in order to indicate improved tolerance to activity.    Time 6    Period Weeks    Status New    Target Date 04/03/20      PT LONG TERM GOAL #3   Title Patient will  be able to complete 5x STS in under 11.4 seconds in order to reduce the risk of falls.    Time 6    Period Weeks    Status  New    Target Date 04/03/20      PT LONG TERM GOAL #4   Title Patient will demonstrate at least 25% improvement in lumbar ROM in all planes for improved ability to move trunk for housework.    Time 6    Period Weeks    Status New    Target Date 04/03/20                 Plan - 03/14/20 1350    Clinical Impression Statement SI in alignment.  Session focus on core strengthening to assist with SI alignment.  Pt presents with improved core activation with less cueing required for posterior pelvic rotation with reports of relief in sessoin.  Pt continues to have high pain scale though presents with improved gait mechanics and seated posture with minimal cueing.  Noted tendency to stand with less WB to Rt, added rockerboard to improve awareness with equal weight bearing.    Personal Factors and Comorbidities Comorbidity 3+;Fitness;Past/Current Experience;Time since onset of injury/illness/exacerbation;Social Background    Comorbidities anxiety, depression, HTN, carotid artery blockage on L, chronic LBP, chronic hip pain, sedentary lifestyle    Examination-Activity Limitations Bathing;Bed Mobility;Bend;Caring for Others;Carry;Continence;Dressing;Hygiene/Grooming;Lift;Locomotion Level;Sit;Squat;Stairs;Stand;Transfers    Examination-Participation Restrictions Cleaning;Church;Community Activity;Driving;Meal Prep;Shop;Volunteer;Yard Work    [employer] officer Evolving/Moderate complexity    Clinical Decision Making Moderate    Rehab Potential Fair    PT Frequency 2x / week    PT Duration 6 weeks    PT Treatment/Interventions ADLs/Self Care Home Management;Aquatic Therapy;Biofeedback;Cryotherapy;Electrical Stimulation;Iontophoresis '4mg'$ /ml Dexamethasone;Moist Heat;Traction;Ultrasound;DME Instruction;Gait training;Stair training;Functional mobility training;Therapeutic activities;Therapeutic exercise;Balance training;Neuromuscular re-education;Patient/family education;Orthotic  Fit/Training;Manual techniques;Manual lymph drainage;Passive range of motion;Dry needling;Energy conservation;Splinting;Taping;Spinal Manipulations;Joint Manipulations    PT Next Visit Plan Add gluteal strengthening next session.  Continue core and proximal strengthening, weight bearing to equalize with gait.  Manual for pain control.    PT Home Exercise Plan 02/23/20: ab set; 6/2: LTR and bridges; 03/07/20: diaphragmatic breathing.           Patient will benefit from skilled therapeutic intervention in order to improve the following deficits and impairments:  Abnormal gait, Decreased activity tolerance, Decreased balance, Decreased mobility, Decreased endurance, Decreased range of motion, Decreased strength, Difficulty walking, Increased muscle spasms, Impaired perceived functional ability, Impaired sensation, Improper body mechanics, Pain  Visit Diagnosis: Other abnormalities of gait and mobility  Other symptoms and signs involving the musculoskeletal system  Low back pain, unspecified back pain laterality, unspecified chronicity, unspecified whether sciatica present  Muscle weakness (generalized)     Problem List Patient Active Problem List   Diagnosis Date Noted  . Chronic diastolic HF (heart failure) ([place]) 08/10/2018  . GERD (gastroesophageal reflux disease) 08/09/2018  . Unstable angina ([place]) 08/09/2018  . Carotid artery disease ([place]) 07/24/2017  . AAA (abdominal aortic aneurysm) without rupture ([place]) 01/05/2013  . Other malaise and fatigue 01/05/2013  . Palpitations 01/05/2013  . Tobacco use disorder 12/02/2012  . Coronary artery disease   . Hyperlipidemia   . Hypertension   . SHORTNESS OF BREATH 09/04/2009  . CHEST PAIN 09/04/2009   Ihor Austin, LPTA/CLT; CBIS [phone]  Aldona Lento 03/14/2020, 7:00 PM  [facility] [address] [place], [place], [address] Phone: [phone]   Fax:  [phone]  Name:  INIS BORNEMAN MRN:  [id number] Date of Birth: [birth year]

## 2020-03-19 ENCOUNTER — Ambulatory Visit (HOSPITAL_COMMUNITY): Payer: Medicare HMO | Admitting: Physical Therapy

## 2020-03-19 ENCOUNTER — Telehealth (HOSPITAL_COMMUNITY): Payer: Self-pay | Admitting: Physical Therapy

## 2020-03-19 NOTE — Telephone Encounter (Signed)
She is in too much pain in her back and hip - she will be here on Wed

## 2020-03-21 ENCOUNTER — Telehealth (HOSPITAL_COMMUNITY): Payer: Self-pay | Admitting: Physical Therapy

## 2020-03-21 ENCOUNTER — Ambulatory Visit (HOSPITAL_COMMUNITY): Payer: Medicare HMO | Admitting: Physical Therapy

## 2020-03-21 NOTE — Telephone Encounter (Signed)
pt caled to cx today's appt due to she is in a lot of pain

## 2020-03-26 ENCOUNTER — Other Ambulatory Visit: Payer: Self-pay

## 2020-03-26 ENCOUNTER — Ambulatory Visit (HOSPITAL_COMMUNITY): Payer: Medicare HMO | Admitting: Physical Therapy

## 2020-03-26 ENCOUNTER — Encounter (HOSPITAL_COMMUNITY): Payer: Self-pay | Admitting: Physical Therapy

## 2020-03-26 DIAGNOSIS — R29898 Other symptoms and signs involving the musculoskeletal system: Secondary | ICD-10-CM

## 2020-03-26 DIAGNOSIS — R2689 Other abnormalities of gait and mobility: Secondary | ICD-10-CM

## 2020-03-26 DIAGNOSIS — M545 Low back pain, unspecified: Secondary | ICD-10-CM

## 2020-03-26 DIAGNOSIS — M6281 Muscle weakness (generalized): Secondary | ICD-10-CM | POA: Diagnosis not present

## 2020-03-26 NOTE — Therapy (Signed)
[place], [place], [address] Phone: [phone]   Fax:  [phone]  Physical Therapy Treatment  Patient Details  Name: Shannon [name] MRN: [id number] Date of Birth: 15-Dec-1965 Referring Provider (PT): Levy Pupa PA-C   Encounter Date: 03/26/2020   PT End of Session - 03/26/20 1134    Visit Number 7    Number of Visits 12    Date for PT Re-Evaluation 04/03/20    Authorization Type [employer] 12 visits approved    Authorization Time Period 02/22/20-04/03/20    Authorization - Visit Number 7    Authorization - Number of Visits 12    Progress Note Due on Visit 10    PT Start Time 1132    PT Stop Time 1212    PT Time Calculation (min) 40 min    Activity Tolerance Patient limited by pain;No increased pain    Behavior During Therapy WFL for tasks assessed/performed           Past Medical History:  Diagnosis Date  . Anemia   . Anxiety   . Arthritis    oa needs hip replacement on right  . Cancer ([place])    squamous cell areas removed from vulva and pre caner areas removed from leg   . Carotid artery occlusion   . Chronic diastolic HF (heart failure) ([place])    pt denies  . COPD (chronic obstructive pulmonary disease) ([place])    pt denies  . Coronary artery disease    LHC 11/25/12 90% stenosis mid LCx & otherwise nonobstructive dz w/ EF 60-65% S/p PTCA/DES to LCx  . Depression   . Elevated cholesterol   . Exertional dyspnea    chronic  . GERD (gastroesophageal reflux disease)   . History of cardiovascular stress test 06/2015   low risk  . HPV in female   . Hyperlipidemia   . Hypertension   . MI (myocardial infarction) ([place]) fe 17, 2014  . Obesity   . Polycystic ovary disease   . Skin tear of upper arm without complication, left, sequela    small skin tear left upper arm no drainage for 1 week  . Tobacco abuse     Past Surgical History:  Procedure Laterality Date  . CORONARY ANGIOPLASTY WITH  STENT PLACEMENT    . LEFT HEART CATH AND CORONARY ANGIOGRAPHY N/A 08/09/2018   Procedure: LEFT HEART CATH AND CORONARY ANGIOGRAPHY;  Surgeon: Nelva Bush, MD;  Location: [place] CV LAB;  Service: Cardiovascular;  Laterality: N/A;  . LEFT HEART CATHETERIZATION WITH CORONARY ANGIOGRAM N/A 11/15/2012   Procedure: LEFT HEART CATHETERIZATION WITH CORONARY ANGIOGRAM;  Surgeon: Thayer Headings, MD;  Location: [facility] CATH LAB;  Service: Cardiovascular;  Laterality: N/A;  . PERCUTANEOUS CORONARY STENT INTERVENTION (PCI-S)  11/15/2012   Procedure: PERCUTANEOUS CORONARY STENT INTERVENTION (PCI-S);  Surgeon: Thayer Headings, MD;  Location: [facility] CATH LAB;  Service: Cardiovascular;;  . skin cancer removed right lower leg Right   . TONSILLECTOMY    . uterus ablation    . VULVECTOMY N/A 11/08/2019   Procedure: excision of VULVA, vulvoscopy;  Surgeon: Dian Queen, MD;  Location: [facility];  Service: Gynecology;  Laterality: N/A;    There were no vitals filed for this visit.   Subjective Assessment - 03/26/20 1138    Subjective States after last session she was in a lot pain and had back to back sessions which may have contributed it. States she has increased  her pain meds since last session. States her current pain is 9/10    Pertinent History blocked carotid artery, chronic LBP, hip pain    Patient Stated Goals get out of some of the pain.    Currently in Pain? Yes    Pain Score 9     Pain Location Back    Pain Orientation Right;Left    Pain Descriptors / Indicators Tightness;Throbbing;Sore    Pain Type Chronic pain    Pain Onset More than a month ago              [facility] PT Assessment - 03/26/20 0001      Assessment   Medical Diagnosis Spondylosis, Lumbar region    Referring Provider (PT) Levy Pupa PA-C    Onset Date/Surgical Date 02/21/19                         [facility] Adult PT Treatment/Exercise - 03/26/20 0001      Lumbar Exercises: Stretches    Single Knee to Chest Stretch 5 reps;10 seconds;Left;Right   in 90/90 position feet at wall, 2 sets     Lumbar Exercises: Supine   Other Supine Lumbar Exercises leg press into wall90/90 position 3x10 5"" holds    Other Supine Lumbar Exercises lumbar ROT PT performed with green ball - 6 minutes with bounces to decrease muscle tension      Manual Therapy   Manual Therapy Manual Traction    Manual therapy comments Manual treatmetn performed independant of other activity     Soft tissue mobilization --    Manual Traction with green ball 2x15, 10-30 seconds holds                   PT Education - 03/26/20 1204    Education Details Educated patient on typical response with over doing it and muscle strain/soreness. Educated patient in how to decompress low back and positional relief.    Person(s) Educated Patient    Methods Explanation    Comprehension Verbalized understanding            PT Short Term Goals - 02/21/20 1157      PT SHORT TERM GOAL #1   Title Patient will be independent with HEP in order to improve functional outcomes.    Time 3    Period Weeks    Status New    Target Date 03/13/20      PT SHORT TERM GOAL #2   Title Patient will report at least 25% improvement in symptoms for improved quality of life.    Time 3    Period Weeks    Status New    Target Date 03/13/20             PT Long Term Goals - 02/21/20 1158      PT LONG TERM GOAL #1   Title Patient will report at least 75% improvement in symptoms for improved quality of life.    Time 6    Period Weeks    Status New    Target Date 04/03/20      PT LONG TERM GOAL #2   Title Patient will improve FOTO score by at least 5 points in order to indicate improved tolerance to activity.    Time 6    Period Weeks    Status New    Target Date 04/03/20      PT LONG TERM GOAL #3   Title Patient will  be able to complete 5x STS in under 11.4 seconds in order to reduce the risk of falls.    Time 6     Period Weeks    Status New    Target Date 04/03/20      PT LONG TERM GOAL #4   Title Patient will demonstrate at least 25% improvement in lumbar ROM in all planes for improved ability to move trunk for housework.    Time 6    Period Weeks    Status New    Target Date 04/03/20                 Plan - 03/26/20 1135    Clinical Impression Statement Answered all questions on this date. Patient with recent increase in pain from possibly doing rocker board last session. Patient tolerated traction well and off loaded position of supported 90/90 degrees. Reduced symptoms noted end of session 7/10 pain. Will continue to work on lumbar/hip mobility as tolerated.    Personal Factors and Comorbidities Comorbidity 3+;Fitness;Past/Current Experience;Time since onset of injury/illness/exacerbation;Social Background    Comorbidities anxiety, depression, HTN, carotid artery blockage on L, chronic LBP, chronic hip pain, sedentary lifestyle    Examination-Activity Limitations Bathing;Bed Mobility;Bend;Caring for Others;Carry;Continence;Dressing;Hygiene/Grooming;Lift;Locomotion Level;Sit;Squat;Stairs;Stand;Transfers    Examination-Participation Restrictions Cleaning;Church;Community Activity;Driving;Meal Prep;Shop;Volunteer;Yard Work    [employer]chant [employer] officer Evolving/Moderate complexity    Rehab Potential Fair    PT Frequency 2x / week    PT Duration 6 weeks    PT Treatment/Interventions ADLs/Self Care Home Management;Aquatic Therapy;Biofeedback;Cryotherapy;Electrical Stimulation;Iontophoresis 4mg /ml Dexamethasone;Moist Heat;Traction;Ultrasound;DME Instruction;Gait training;Stair training;Functional mobility training;Therapeutic activities;Therapeutic exercise;Balance training;Neuromuscular re-education;Patient/family education;Orthotic Fit/Training;Manual techniques;Manual lymph drainage;Passive range of motion;Dry needling;Energy conservation;Splinting;Taping;Spinal Manipulations;Joint  Manipulations    PT Next Visit Plan Add gluteal strengthening next session.  Continue core and proximal strengthening, weight bearing to equalize with gait.  Manual for pain control.    PT Home Exercise Plan 02/23/20: ab set; 6/2: LTR and bridges; 03/07/20: diaphragmatic breathing.;6/28 90/90 supportive position and leg press into wall           Patient will benefit from skilled therapeutic intervention in order to improve the following deficits and impairments:  Abnormal gait, Decreased activity tolerance, Decreased balance, Decreased mobility, Decreased endurance, Decreased range of motion, Decreased strength, Difficulty walking, Increased muscle spasms, Impaired perceived functional ability, Impaired sensation, Improper body mechanics, Pain  Visit Diagnosis: Other abnormalities of gait and mobility  Other symptoms and signs involving the musculoskeletal system  Low back pain, unspecified back pain laterality, unspecified chronicity, unspecified whether sciatica present  Muscle weakness (generalized)     Problem List Patient Active Problem List   Diagnosis Date Noted  . Chronic diastolic HF (heart failure) ([place]) 08/10/2018  . GERD (gastroesophageal reflux disease) 08/09/2018  . Unstable angina ([place]) 08/09/2018  . Carotid artery disease ([place]) 07/24/2017  . AAA (abdominal aortic aneurysm) without rupture ([place]) 01/05/2013  . Other malaise and fatigue 01/05/2013  . Palpitations 01/05/2013  . Tobacco use disorder 12/02/2012  . Coronary artery disease   . Hyperlipidemia   . Hypertension   . SHORTNESS OF BREATH 09/04/2009  . CHEST PAIN 09/04/2009   12:21 PM, 03/26/20 Jerene Pitch, DPT Physical Therapy with [facility]  [phone] office  [facility] [place] [address] [place], [place], [address] Phone: [phone]   Fax:  [phone]  Name: Shannon [name] MRN: [id number] Date of Birth: 1966/06/14

## 2020-03-28 ENCOUNTER — Encounter (HOSPITAL_COMMUNITY): Payer: Medicare HMO

## 2020-03-28 ENCOUNTER — Telehealth (HOSPITAL_COMMUNITY): Payer: Self-pay

## 2020-03-28 ENCOUNTER — Ambulatory Visit (HOSPITAL_COMMUNITY): Payer: Medicare HMO

## 2020-03-28 NOTE — Telephone Encounter (Signed)
cx she has no power at her house

## 2020-04-01 ENCOUNTER — Encounter: Payer: Self-pay | Admitting: Dermatology

## 2020-04-01 NOTE — Progress Notes (Addendum)
   New Patient   Subjective  Shannon [name] is a 54 y.o. female who presents for the following: New Patient (Initial Visit) (Patient here today for skin check.  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 Pt does have a few spots she would like removed on right upper thigh, and left side hip these spots do bleed when clothes rub on them.).  Growths each upper leg Location:  Duration: 1 year Quality: Larger with history of bleeding Associated Signs/Symptoms: Modifying Factors:  Severity:  Timing: Context: History of skin cancer right leg   The following portions of the chart were reviewed this encounter and updated as appropriate: Tobacco  Allergies  Meds  Problems  Med Hx  Surg Hx  Fam Hx      Objective  Well appearing patient in no apparent distress; mood and affect are within normal limits.  All sun exposed areas plus back examined. Plus legs.   Assessment & Plan  Neoplasm of uncertain behavior of skin (2) Right Thigh - Anterior  Skin / nail biopsy Type of biopsy: tangential   Informed consent: discussed and consent obtained   Timeout: patient name, date of birth, surgical site, and procedure verified   Procedure prep:  Patient was prepped and draped in usual sterile fashion Prep type:  Chlorhexidine Anesthesia: the lesion was anesthetized in a standard fashion   Anesthetic:  1% lidocaine w/ epinephrine 1-100,000 local infiltration Instrument used: flexible razor blade   Hemostasis achieved with: ferric subsulfate   Outcome: patient tolerated procedure well   Post-procedure details: wound care instructions given    Specimen 1 - Surgical pathology Differential Diagnosis: isk Check Margins: No  Left Thigh - Anterior  Skin / nail biopsy Type of biopsy: tangential   Informed consent: discussed and consent obtained   Timeout: patient name, date of birth, surgical site, and procedure verified   Procedure prep:  Patient was prepped and draped in usual sterile  fashion Prep type:  Chlorhexidine Anesthesia: the lesion was anesthetized in a standard fashion   Anesthetic:  1% lidocaine w/ epinephrine 1-100,000 local infiltration Instrument used: flexible razor blade   Hemostasis achieved with: ferric subsulfate   Outcome: patient tolerated procedure well   Post-procedure details: wound care instructions given    Specimen 2 - Surgical pathology Differential Diagnosis: isk Check Margins: No  Personal history of skin cancer Right Lower Leg - Anterior  Annual skin examination  Nevus Mid Back  Self examine skin twice annually

## 2020-04-03 ENCOUNTER — Encounter (HOSPITAL_COMMUNITY): Payer: Medicare HMO

## 2020-04-05 ENCOUNTER — Encounter (HOSPITAL_COMMUNITY): Payer: Medicare HMO | Admitting: Physical Therapy

## 2020-04-06 ENCOUNTER — Other Ambulatory Visit: Payer: Self-pay

## 2020-04-06 ENCOUNTER — Ambulatory Visit (HOSPITAL_COMMUNITY): Payer: Medicare HMO | Attending: Chiropractic Medicine | Admitting: Physical Therapy

## 2020-04-06 DIAGNOSIS — R2689 Other abnormalities of gait and mobility: Secondary | ICD-10-CM | POA: Diagnosis not present

## 2020-04-06 DIAGNOSIS — M6281 Muscle weakness (generalized): Secondary | ICD-10-CM | POA: Insufficient documentation

## 2020-04-06 DIAGNOSIS — M545 Low back pain, unspecified: Secondary | ICD-10-CM

## 2020-04-06 DIAGNOSIS — R29898 Other symptoms and signs involving the musculoskeletal system: Secondary | ICD-10-CM | POA: Diagnosis not present

## 2020-04-06 NOTE — Therapy (Signed)
[facility] [address] [place], [place], [address] Phone: [phone]   Fax:  [phone]  Physical Therapy Treatment  Patient Details  Name: Shannon [name] MRN: [id number] Date of Birth: Feb 20, 1966 Referring Provider (PT): Levy Pupa PA-C  Progress Note Reporting Period 02/21/2020  to 04/06/2020  See note below for Objective Data and Assessment of Progress/Goals.       Encounter Date: 04/06/2020   PT End of Session - 04/06/20 1120    Visit Number 8    Number of Visits 20    Date for PT Re-Evaluation 05/06/20    Authorization Type [employer] 12 visits approved time ran our requested 12 more on 04/06/2020    Authorization Time Period 02/22/20-04/03/20    Authorization - Visit Number 8    Authorization - Number of Visits 7   time period ended requested additional visits   Progress Note Due on Visit 18    PT Start Time 1050    PT Stop Time 1130    PT Time Calculation (min) 40 min    Activity Tolerance Patient limited by pain;No increased pain    Behavior During Therapy WFL for tasks assessed/performed           Past Medical History:  Diagnosis Date  . Anemia   . Anxiety   . Arthritis    oa needs hip replacement on right  . Cancer ([place])    squamous cell areas removed from vulva and pre caner areas removed from leg   . Carotid artery occlusion   . Chronic diastolic HF (heart failure) ([place])    pt denies  . COPD (chronic obstructive pulmonary disease) ([place])    pt denies  . Coronary artery disease    LHC 11/25/12 90% stenosis mid LCx & otherwise nonobstructive dz w/ EF 60-65% S/p PTCA/DES to LCx  . Depression   . Elevated cholesterol   . Exertional dyspnea    chronic  . GERD (gastroesophageal reflux disease)   . History of cardiovascular stress test 06/2015   low risk  . HPV in female   . Hyperlipidemia   . Hypertension   . MI (myocardial infarction) ([place]) fe 17, 2014  . Obesity   . Polycystic ovary disease   .  Skin tear of upper arm without complication, left, sequela    small skin tear left upper arm no drainage for 1 week  . Tobacco abuse     Past Surgical History:  Procedure Laterality Date  . CORONARY ANGIOPLASTY WITH STENT PLACEMENT    . LEFT HEART CATH AND CORONARY ANGIOGRAPHY N/A 08/09/2018   Procedure: LEFT HEART CATH AND CORONARY ANGIOGRAPHY;  Surgeon: Nelva Bush, MD;  Location: [facility];  Service: Cardiovascular;  Laterality: N/A;  . LEFT HEART CATHETERIZATION WITH CORONARY ANGIOGRAM N/A 11/15/2012   Procedure: LEFT HEART CATHETERIZATION WITH CORONARY ANGIOGRAM;  Surgeon: Thayer Headings, MD;  Location: [facility] CATH LAB;  Service: Cardiovascular;  Laterality: N/A;  . PERCUTANEOUS CORONARY STENT INTERVENTION (PCI-S)  11/15/2012   Procedure: PERCUTANEOUS CORONARY STENT INTERVENTION (PCI-S);  Surgeon: Thayer Headings, MD;  Location: [facility] CATH LAB;  Service: Cardiovascular;;  . skin cancer removed right lower leg Right   . TONSILLECTOMY    . uterus ablation    . VULVECTOMY N/A 11/08/2019   Procedure: excision of VULVA, vulvoscopy;  Surgeon: Dian Queen, MD;  Location: [facility];  Service: Gynecology;  Laterality: N/A;    There were no vitals filed  for this visit.   Subjective Assessment - 04/06/20 1100    Subjective Pt states that she does some of her exercises on a regular basis but not all of them.  She also has trouble with her hips and gets injections on a regular basis, she has had four injections in her back without improvement.  She states therapy was helping until she was placed on a rockerboard which seemed to exacerbate her pain.    Pertinent History blocked carotid artery, chronic LBP, hip pain    Limitations Sitting;Lifting;Standing;Walking;House hold activities    How long can you sit comfortably? able to sit for an hour with an upright chair.    How long can you stand comfortably? Able to stand for 30 minutes was unable    How long can you walk  comfortably? due to hip and back pain walking is uncomfortable immediately.    Patient Stated Goals get out of some of the pain.    Currently in Pain? Yes    Pain Score 7    lowest pain in past week  6; worst 9   Pain Location Back    Pain Orientation Right;Left;Lower    Pain Descriptors / Indicators Tightness;Throbbing;Sore    Pain Type Chronic pain    Pain Onset More than a month ago    Pain Frequency Constant    Aggravating Factors  getting out of the car, out of chairs and in and out of the shower    Pain Relieving Factors some of the exercises and meds    Effect of Pain on Daily Activities limits              [facility] PT Assessment - 04/06/20 0001      Assessment   Medical Diagnosis Spondylosis, Lumbar region    Referring Provider (PT) Levy Pupa PA-C    Onset Date/Surgical Date 02/21/19    Next MD Visit none scheduled yet    Prior Therapy yes      Precautions   Precautions None      Restrictions   Weight Bearing Restrictions No      Prior Function   Level of Independence Independent    Vocation On disability    Leisure walking, do household chores more often      Cognition   Overall Cognitive Status Within Functional Limits for tasks assessed      Observation/Other Assessments   Observations Antalgic gait, guarded movements    Focus on Therapeutic Outcomes (FOTO)  75% disabled was 74% limited      Sensation   Light Touch Impaired Detail    Additional Comments decreased L3 on R; decreased L4,L5 on L      Functional Tests   Functional tests Single leg stance      Single Leg Stance   Comments Rt: 28"; LT 32"       Posture/Postural Control   Posture/Postural Control Postural limitations    Postural Limitations Forward head;Rounded Shoulders;Decreased lumbar lordosis    Posture Comments slouched posture in seated      AROM   Overall AROM Comments pain with all lumbar mobility    Lumbar Flexion 75% limited pain   no change returning from forward bend  more painful   Lumbar Extension 75% limited pain   no change   Lumbar - Right Side Bend 75% limited pain    Lumbar - Left Side Bend 75% limited pain    Lumbar - Right Rotation 50% limited pain   no  change    Lumbar - Left Rotation 50% limited   no change      Strength   Overall Strength Comments low back and hip pain with hip testing; leg pain with all other MMT    Right Hip Flexion 4/5    Left Hip Flexion 4/5    Right Knee Flexion 4+/5    Right Knee Extension 5/5   was a 4/5    Left Knee Flexion 4+/5   was 4/5    Left Knee Extension 5/5   was a 4/5    Right Ankle Dorsiflexion 4+/5   was a 4    Left Ankle Dorsiflexion 4+/5   was a 4      Palpation   Spinal mobility assess next session    Palpation comment assess next session      Transfers   Five time sit to stand comments  32.08 was 27.40 seconds    Comments slow, labored, requires use of hands      Ambulation/Gait   Ambulation/Gait Yes    Ambulation/Gait Assistance 6: Modified independent (Device/Increase time)    Ambulation Distance (Feet) 100 Feet    Assistive device None    Gait Pattern Antalgic                         [facility] Adult PT Treatment/Exercise - 04/06/20 0001      Lumbar Exercises: Supine   Other Supine Lumbar Exercises decomprssion ex 1-5                   PT Education - 04/06/20 1119    Education Details decompression exercises    Person(s) Educated Patient    Methods Explanation;Handout    Comprehension Verbalized understanding;Returned demonstration            PT Short Term Goals - 04/06/20 1122      PT SHORT TERM GOAL #1   Title Patient will be independent with HEP in order to improve functional outcomes.    Time 3    Period Weeks    Status On-going    Target Date 03/13/20      PT SHORT TERM GOAL #2   Title Patient will report at least 25% improvement in symptoms for improved quality of life.    Time 3    Period Weeks    Status On-going    Target Date 03/13/20              PT Long Term Goals - 04/06/20 1122      PT LONG TERM GOAL #1   Title Patient will report at least 75% improvement in symptoms for improved quality of life.    Time 6    Period Weeks    Status On-going      PT LONG TERM GOAL #2   Title Patient will improve FOTO score by at least 5 points in order to indicate improved tolerance to activity.    Time 6    Period Weeks    Status On-going      PT LONG TERM GOAL #3   Title Patient will be able to complete 5x STS in under 11.4 seconds in order to reduce the risk of falls.    Time 6    Period Weeks    Status On-going      PT LONG TERM GOAL #4   Title Patient will demonstrate at least 25% improvement in lumbar ROM in all planes for  improved ability to move trunk for housework.    Time 6    Period Weeks    Status On-going                 Plan - 04/06/20 1125    Clinical Impression Statement Pt has not been seen since 4/69, her cert and authorization has ran out.  Pt was reassessed with limited gains.  Pt instructed in decompression exercise therapist requested pt to complete this exercise to see if it will assist in decreasing her pain.   Pt will continue to benefit from skilled therapy, she has been unable to come with consistent.  She continues to have decreased ROM, strength in hip mm, decreased activity tolerance and a high amount of pain.  Ms. Siegenthaler will benefit from continued skilled PT to address the above issues.     Personal Factors and Comorbidities Comorbidity 3+;Fitness;Past/Current Experience;Time since onset of injury/illness/exacerbation;Social Background    Comorbidities anxiety, depression, HTN, carotid artery blockage on L, chronic LBP, chronic hip pain, sedentary lifestyle    Examination-Activity Limitations Bathing;Bed Mobility;Bend;Caring for Others;Carry;Continence;Dressing;Hygiene/Grooming;Lift;Locomotion Level;Sit;Squat;Stairs;Stand;Transfers    Examination-Participation Restrictions  Cleaning;Church;Community Activity;Driving;Meal Prep;Shop;Volunteer;Yard Work    Merchant navy officer Evolving/Moderate complexity    Rehab Potential Fair    PT Frequency 2x / week    PT Duration 12 weeks ( 6 more weeks )   PT Treatment/Interventions ADLs/Self Care Home Management;Aquatic Therapy;Biofeedback;Cryotherapy;Electrical Stimulation;Iontophoresis 4mg /ml Dexamethasone;Moist Heat;Traction;Ultrasound;DME Instruction;Gait training;Stair training;Functional mobility training;Therapeutic activities;Therapeutic exercise;Balance training;Neuromuscular re-education;Patient/family education;Orthotic Fit/Training;Manual techniques;Manual lymph drainage;Passive range of motion;Dry needling;Energy conservation;Splinting;Taping;Spinal Manipulations;Joint Manipulations    PT Next Visit Plan Progresss with lumbar stabilty exercises. Add gluteal strengthening next session.  Continue core and proximal strengthening, weight bearing to equalize with gait.  Manual for pain control.    PT Home Exercise Plan 02/23/20: ab set; 6/2: LTR and bridges; 03/07/20: diaphragmatic breathing.;6/28 90/90 supportive position and leg press into wall; 04/06/2020: decomression exercises           Patient will benefit from skilled therapeutic intervention in order to improve the following deficits and impairments:  Abnormal gait, Decreased activity tolerance, Decreased balance, Decreased mobility, Decreased endurance, Decreased range of motion, Decreased strength, Difficulty walking, Increased muscle spasms, Impaired perceived functional ability, Impaired sensation, Improper body mechanics, Pain  Visit Diagnosis: Other abnormalities of gait and mobility  Other symptoms and signs involving the musculoskeletal system  Low back pain, unspecified back pain laterality, unspecified chronicity, unspecified whether sciatica present  Muscle weakness (generalized)     Problem List Patient Active Problem List    Diagnosis Date Noted  . Chronic diastolic HF (heart failure) ([place]) 08/10/2018  . GERD (gastroesophageal reflux disease) 08/09/2018  . Unstable angina ([place]) 08/09/2018  . Carotid artery disease ([place]) 07/24/2017  . AAA (abdominal aortic aneurysm) without rupture ([place]) 01/05/2013  . Other malaise and fatigue 01/05/2013  . Palpitations 01/05/2013  . Tobacco use disorder 12/02/2012  . Coronary artery disease   . Hyperlipidemia   . Hypertension   . SHORTNESS OF BREATH 09/04/2009  . CHEST PAIN 09/04/2009    Rayetta Humphrey, PT CLT [phone] 04/06/2020, 11:30 AM  [facility] [place], [place], [address] Phone: [phone]   Fax:  [phone]  Name: Shannon [name] MRN: [id number] Date of Birth: [birth year]

## 2020-04-11 ENCOUNTER — Other Ambulatory Visit: Payer: Self-pay

## 2020-04-11 ENCOUNTER — Ambulatory Visit
Admission: EM | Admit: 2020-04-11 | Discharge: 2020-04-11 | Disposition: A | Discharge status: Home / Self Care | Payer: Medicare HMO | Attending: Emergency Medicine | Admitting: Emergency Medicine

## 2020-04-11 DIAGNOSIS — Z20822 Contact with and (suspected) exposure to covid-19: Secondary | ICD-10-CM

## 2020-04-11 NOTE — Discharge Instructions (Signed)

## 2020-04-11 NOTE — ED Triage Notes (Signed)
Pt presents requesting a covid test. Her boyfriend is sick after recent travel. Patient denies any symptoms.

## 2020-04-11 NOTE — ED Provider Notes (Signed)
[facility]   [id number] 04/11/20 Arrival Time: 1815   CC: COVID test  SUBJECTIVE: History from: patient.  Shannon [name] is a 54 y.o. female who presents for COVID testing.  Admits to recent sick exposure to boyfriend, but boyfriend did not test positive for COVID.  Denies recent travel.  Denies aggravating or alleviating symptoms.  Denies previous COVID infection.   Denies fever, chills, fatigue, nasal congestion, rhinorrhea, sore throat, cough, SOB, wheezing, chest pain, nausea, vomiting, changes in bowel or bladder habits.      ROS: As per HPI.  All other pertinent ROS negative.     Past Medical History:  Diagnosis Date  . Anemia   . Anxiety   . Arthritis    oa needs hip replacement on right  . Cancer ([place])    squamous cell areas removed from vulva and pre caner areas removed from leg   . Carotid artery occlusion   . Chronic diastolic HF (heart failure) ([place])    pt denies  . COPD (chronic obstructive pulmonary disease) ([place])    pt denies  . Coronary artery disease    LHC 11/25/12 90% stenosis mid LCx & otherwise nonobstructive dz w/ EF 60-65% S/p PTCA/DES to LCx  . Depression   . Elevated cholesterol   . Exertional dyspnea    chronic  . GERD (gastroesophageal reflux disease)   . History of cardiovascular stress test 06/2015   low risk  . HPV in female   . Hyperlipidemia   . Hypertension   . MI (myocardial infarction) ([place]) fe 17, 2014  . Obesity   . Polycystic ovary disease   . Skin tear of upper arm without complication, left, sequela    small skin tear left upper arm no drainage for 1 week  . Tobacco abuse    Past Surgical History:  Procedure Laterality Date  . CORONARY ANGIOPLASTY WITH STENT PLACEMENT    . LEFT HEART CATH AND CORONARY ANGIOGRAPHY N/A 08/09/2018   Procedure: LEFT HEART CATH AND CORONARY ANGIOGRAPHY;  Surgeon: Nelva Bush, MD;  Location: [facility];  Service: Cardiovascular;  Laterality: N/A;  . LEFT HEART  CATHETERIZATION WITH CORONARY ANGIOGRAM N/A 11/15/2012   Procedure: LEFT HEART CATHETERIZATION WITH CORONARY ANGIOGRAM;  Surgeon: Thayer Headings, MD;  Location[facility] CATH LAB;  Service: Cardiovascular;  Laterality: N/A;  . PERCUTANEOUS CORONARY STENT INTERVENTION (PCI-S)  11/15/2012   Procedure: PERCUTANEOUS CORONARY STENT INTERVENTION (PCI-S);  Surgeon: Thayer Headings, MD;  Location: [facility] CATH LAB;  Service: Cardiovascular;;  . skin cancer removed right lower leg Right   . TONSILLECTOMY    . uterus ablation    . VULVECTOMY N/A 11/08/2019   Procedure: excision of VULVA, vulvoscopy;  Surgeon: Dian Queen, MD;  Location: [facility];  Service: Gynecology;  Laterality: N/A;   Allergies  Allergen Reactions  . Morphine And Related Other (See Comments)    PATIENT REFUSES THIS MEDICATION: states that she does not tolerate this medication well  . Sulfa Antibiotics     Not sure ? rash   No current facility-administered medications on file prior to encounter.   Current Outpatient Medications on File Prior to Encounter  Medication Sig Dispense Refill  . albuterol (PROVENTIL HFA;VENTOLIN HFA) 108 (90 Base) MCG/ACT inhaler Inhale 2 puffs into the lungs every 4 (four) hours as needed for wheezing or shortness of breath. 1 Inhaler 0  . ALPRAZolam (XANAX) 1 MG tablet Take 1 mg by mouth 2 (two) times daily as  needed.  2  . aspirin EC 81 MG tablet Take 81 mg by mouth daily.     . bisoprolol-hydrochlorothiazide (ZIAC) 5-6.25 MG tablet     . escitalopram (LEXAPRO) 20 MG tablet Take 1 tablet (20 mg total) by mouth daily. 30 tablet 0  . fluticasone (FLONASE) 50 MCG/ACT nasal spray at bedtime.     . furosemide (LASIX) 20 MG tablet Take 20 mg by mouth as needed.    Marland Kitchen ibuprofen (ADVIL) 200 MG tablet Take 200 mg by mouth every 6 (six) hours as needed.    . nitroGLYCERIN (NITROSTAT) 0.4 MG SL tablet Place 1 tablet (0.4 mg total) under the tongue every 5 (five) minutes as needed for chest pain (up to  3 doses). 25 tablet 3  . NON FORMULARY Steroid injection to right hip 11-25-2019    . nystatin-triamcinolone (MYCOLOG II) cream     . Oxycodone HCl 10 MG TABS     . pantoprazole (PROTONIX) 40 MG tablet Take 40 mg by mouth at bedtime.    . rosuvastatin (CRESTOR) 20 MG tablet Take 1 tablet (20 mg total) by mouth at bedtime. 30 tablet 6  . triamcinolone cream (KENALOG) 0.1 % Apply 1 application topically 2 (two) times daily. 454 g 0   Social History   Socioeconomic History  . Marital status: Legally Separated    Spouse name: Not on file  . Number of children: Not on file  . Years of education: Not on file  . Highest education level: Not on file  Occupational History  . Not on file  Tobacco Use  . Smoking status: Current Every Day Smoker    Packs/day: 2.00    Years: 40.00    Pack years: 80.00    Types: Cigarettes    Start date: 03/29/1979  . Smokeless tobacco: Never Used  . Tobacco comment: Currently 2ppd  Vaping Use  . Vaping Use: Never used  Substance and Sexual Activity  . Alcohol use: Yes    Alcohol/week: 0.0 standard drinks    Comment: rare  . Drug use: No  . Sexual activity: Not Currently    Partners: Male  Other Topics Concern  . Not on file  Social History Narrative   Lives with husband in a 3 story home.  Has no children.     On disability.     Education: high school, some college.   Social Determinants of Health   Financial Resource Strain:   . Difficulty of Paying Living Expenses:   Food Insecurity:   . Worried About Charity fundraiser in the Last Year:   . Arboriculturist in the Last Year:   Transportation Needs:   . Film/video editor (Medical):   Marland Kitchen Lack of Transportation (Non-Medical):   Physical Activity:   . Days of Exercise per Week:   . Minutes of Exercise per Session:   Stress:   . Feeling of Stress :   Social Connections:   . Frequency of Communication with Friends and Family:   . Frequency of Social Gatherings with Friends and Family:     . Attends Religious Services:   . Active Member of Clubs or Organizations:   . Attends Archivist Meetings:   Marland Kitchen Marital Status:   Intimate Partner Violence:   . Fear of Current or Ex-Partner:   . Emotionally Abused:   Marland Kitchen Physically Abused:   . Sexually Abused:    Family History  Problem Relation Age of Onset  .  Depression Mother   . Anxiety disorder Maternal Grandmother   . Anxiety disorder Paternal Grandmother   . OCD Paternal Grandmother   . Depression Paternal Grandmother   . Heart attack Father        Deceased, 26  . Cerebral aneurysm Father   . Heart disease Father     OBJECTIVE:  Vitals:   04/11/20 1821  BP: 130/84  Pulse: 97  Resp: 17  Temp: 99.1 F (37.3 C)  TempSrc: Tympanic  SpO2: 97%    General appearance: alert; well-appearing, nontoxic; speaking in full sentences and tolerating own secretions HEENT: NCAT; Ears: EACs clear, TMs pearly gray; Eyes: PERRL.  EOM grossly intact .Nose: nares patent without rhinorrhea, Throat: oropharynx clear, tonsils non erythematous or enlarged, uvula midline  Neck: supple without LAD Lungs: unlabored respirations, symmetrical air entry; cough: absent; no respiratory distress; CTAB Heart: regular rate and rhythm.  Skin: warm and dry Psychological: alert and cooperative; normal mood and affect   ASSESSMENT & PLAN:  1. Encounter for laboratory testing for COVID-19 virus     COVID testing ordered.  It will take between 2-5 days for test results.  Someone will contact you regarding abnormal results.    In the meantime:  If you were to develop symptoms: You should remain isolated in your home for 10 days from symptom onset AND greater than 72 hours after symptoms resolution (absence of fever without the use of fever-reducing medication and improvement in respiratory symptoms), whichever is longer If you have had exposure: you should remain in quarantine for 7 days from exposure.  However, if symptoms develop you must  self isolate and return for retesting Get plenty of rest and push fluids Follow up with PCP as needed Call or go to the ED if you have any new symptoms such as fever, cough, shortness of breath, chest tightness, chest pain, turning blue, changes in mental status, etc...    Reviewed expectations re: course of current medical issues. Questions answered. Outlined signs and symptoms indicating need for more acute intervention. Patient verbalized understanding. After Visit Summary given.         Lestine Box, PA-C 04/11/20 1850

## 2020-04-12 ENCOUNTER — Telehealth (HOSPITAL_COMMUNITY): Payer: Self-pay | Admitting: Physical Therapy

## 2020-04-12 ENCOUNTER — Ambulatory Visit (HOSPITAL_COMMUNITY): Payer: Medicare HMO | Admitting: Physical Therapy

## 2020-04-12 ENCOUNTER — Other Ambulatory Visit: Payer: Self-pay

## 2020-04-12 ENCOUNTER — Encounter (HOSPITAL_COMMUNITY): Payer: Self-pay | Admitting: Physical Therapy

## 2020-04-12 DIAGNOSIS — R29898 Other symptoms and signs involving the musculoskeletal system: Secondary | ICD-10-CM

## 2020-04-12 DIAGNOSIS — M545 Low back pain, unspecified: Secondary | ICD-10-CM

## 2020-04-12 DIAGNOSIS — M6281 Muscle weakness (generalized): Secondary | ICD-10-CM

## 2020-04-12 DIAGNOSIS — R2689 Other abnormalities of gait and mobility: Secondary | ICD-10-CM | POA: Diagnosis not present

## 2020-04-12 DIAGNOSIS — M169 Osteoarthritis of hip, unspecified: Secondary | ICD-10-CM | POA: Diagnosis not present

## 2020-04-12 DIAGNOSIS — M1612 Unilateral primary osteoarthritis, left hip: Secondary | ICD-10-CM | POA: Diagnosis not present

## 2020-04-12 LAB — NOVEL CORONAVIRUS, NAA: SARS-CoV-2, NAA: NOT DETECTED

## 2020-04-12 LAB — SARS-COV-2, NAA 2 DAY TAT

## 2020-04-12 NOTE — Telephone Encounter (Signed)
Pt called she can not be here on Friday and she wanted to cx

## 2020-04-12 NOTE — Therapy (Signed)
[place], [place], [address] Phone: [phone]   Fax:  [phone]  Physical Therapy Treatment  Patient Details  Name: Shannon [name] MRN: [id number] Date of Birth: Aug 29, 1966 Referring Provider (PT): Levy Pupa PA-C   Encounter Date: 04/12/2020   PT End of Session - 04/12/20 1410    Visit Number 9    Number of Visits 20    Date for PT Re-Evaluation 05/06/20    Authorization Type [employer] 12 visits approved time ran our requested 12 more on 04/06/2020    Authorization Time Period 12 visits approved 7/9 to 8/23    Authorization - Visit Number 2    Authorization - Number of Visits 12   time period ended requested additional visits   Progress Note Due on Visit 18    Activity Tolerance Patient limited by pain;No increased pain    Behavior During Therapy WFL for tasks assessed/performed           Past Medical History:  Diagnosis Date  . Anemia   . Anxiety   . Arthritis    oa needs hip replacement on right  . Cancer ([place])    squamous cell areas removed from vulva and pre caner areas removed from leg   . Carotid artery occlusion   . Chronic diastolic HF (heart failure) ([place])    pt denies  . COPD (chronic obstructive pulmonary disease) ([place])    pt denies  . Coronary artery disease    LHC 11/25/12 90% stenosis mid LCx & otherwise nonobstructive dz w/ EF 60-65% S/p PTCA/DES to LCx  . Depression   . Elevated cholesterol   . Exertional dyspnea    chronic  . GERD (gastroesophageal reflux disease)   . History of cardiovascular stress test 06/2015   low risk  . HPV in female   . Hyperlipidemia   . Hypertension   . MI (myocardial infarction) ([place]) fe 17, 2014  . Obesity   . Polycystic ovary disease   . Skin tear of upper arm without complication, left, sequela    small skin tear left upper arm no drainage for 1 week  . Tobacco abuse     Past Surgical History:  Procedure Laterality Date  .  CORONARY ANGIOPLASTY WITH STENT PLACEMENT    . LEFT HEART CATH AND CORONARY ANGIOGRAPHY N/A 08/09/2018   Procedure: LEFT HEART CATH AND CORONARY ANGIOGRAPHY;  Surgeon: Nelva Bush, MD;  Location: [facility];  Service: Cardiovascular;  Laterality: N/A;  . LEFT HEART CATHETERIZATION WITH CORONARY ANGIOGRAM N/A 11/15/2012   Procedure: LEFT HEART CATHETERIZATION WITH CORONARY ANGIOGRAM;  Surgeon: Thayer Headings, MD;  Location: [facility] CATH LAB;  Service: Cardiovascular;  Laterality: N/A;  . PERCUTANEOUS CORONARY STENT INTERVENTION (PCI-S)  11/15/2012   Procedure: PERCUTANEOUS CORONARY STENT INTERVENTION (PCI-S);  Surgeon: Thayer Headings, MD;  Location: [facility] CATH LAB;  Service: Cardiovascular;;  . skin cancer removed right lower leg Right   . TONSILLECTOMY    . uterus ablation    . VULVECTOMY N/A 11/08/2019   Procedure: excision of VULVA, vulvoscopy;  Surgeon: Dian Queen, MD;  Location: [facility];  Service: Gynecology;  Laterality: N/A;    There were no vitals filed for this visit.   Subjective Assessment - 04/12/20 1409    Subjective States she feels she is starting from square one as she has been busy and has been able to do her things like she wants to. States she  had a hip injection on the left and she is having 6/10 pain right now. States that she is getting an epidural to see if it helps.    Pertinent History blocked carotid artery, chronic LBP, hip pain    Limitations Sitting;Lifting;Standing;Walking;House hold activities    How long can you sit comfortably? able to sit for an hour with an upright chair.    How long can you stand comfortably? Able to stand for 30 minutes was unable    How long can you walk comfortably? due to hip and back pain walking is uncomfortable immediately.    Patient Stated Goals get out of some of the pain.    Currently in Pain? Yes    Pain Score 6     Pain Location Back    Pain Orientation Mid    Pain Descriptors / Indicators  Aching;Sore    Pain Onset More than a month ago              [facility] PT Assessment - 04/12/20 0001      Assessment   Medical Diagnosis Spondylosis, Lumbar region    Referring Provider (PT) Levy Pupa PA-C    Onset Date/Surgical Date 02/21/19                         [facility] Adult PT Treatment/Exercise - 04/12/20 0001      Lumbar Exercises: Stretches   Double Knee to Chest Stretch --   4 minutes total    Lower Trunk Rotation 60 seconds   5 sets      Manual Therapy   Manual Therapy Manual Traction    Manual therapy comments Manual treatment performed independant of other activity     Soft tissue mobilization IASTM to lumbar paraspinals, QL  B with percussion gun    Manual Traction with green ball 2x15, 10-30 seconds holds                   PT Education - 04/12/20 1650    Education Details in current condition and how strengthening exercises, aquatic therapy and decompression will help decrease her current symptoms.    Person(s) Educated Patient    Methods Explanation    Comprehension Verbalized understanding            PT Short Term Goals - 04/06/20 1122      PT SHORT TERM GOAL #1   Title Patient will be independent with HEP in order to improve functional outcomes.    Time 3    Period Weeks    Status On-going    Target Date 03/13/20      PT SHORT TERM GOAL #2   Title Patient will report at least 25% improvement in symptoms for improved quality of life.    Time 3    Period Weeks    Status On-going    Target Date 03/13/20             PT Long Term Goals - 04/06/20 1122      PT LONG TERM GOAL #1   Title Patient will report at least 75% improvement in symptoms for improved quality of life.    Time 6    Period Weeks    Status On-going      PT LONG TERM GOAL #2   Title Patient will improve FOTO score by at least 5 points in order to indicate improved tolerance to activity.    Time 6  Period Weeks    Status On-going      PT LONG  TERM GOAL #3   Title Patient will be able to complete 5x STS in under 11.4 seconds in order to reduce the risk of falls.    Time 6    Period Weeks    Status On-going      PT LONG TERM GOAL #4   Title Patient will demonstrate at least 25% improvement in lumbar ROM in all planes for improved ability to move trunk for housework.    Time 6    Period Weeks    Status On-going                 Plan - 04/12/20 1422    Clinical Impression Statement Patient with reports of increased pain and preference to massage based therapy. Focused on educating patient on current condition and how weakness, weight and posture contribute to current condition. Discussed possible benefits of aquatic therapy and answered all questions on this date.    Personal Factors and Comorbidities Comorbidity 3+;Fitness;Past/Current Experience;Time since onset of injury/illness/exacerbation;Social Background    Comorbidities anxiety, depression, HTN, carotid artery blockage on L, chronic LBP, chronic hip pain, sedentary lifestyle    Examination-Activity Limitations Bathing;Bed Mobility;Bend;Caring for Others;Carry;Continence;Dressing;Hygiene/Grooming;Lift;Locomotion Level;Sit;Squat;Stairs;Stand;Transfers    Examination-Participation Restrictions Cleaning;Church;Community Activity;Driving;Meal Prep;Shop;Volunteer;Yard Work    [employer] officer Evolving/Moderate complexity    Rehab Potential Fair    PT Frequency 2x / week    PT Duration --   12 weeks   PT Treatment/Interventions ADLs/Self Care Home Management;Aquatic Therapy;Biofeedback;Cryotherapy;Electrical Stimulation;Iontophoresis 4mg /ml Dexamethasone;Moist Heat;Traction;Ultrasound;DME Instruction;Gait training;Stair training;Functional mobility training;Therapeutic activities;Therapeutic exercise;Balance training;Neuromuscular re-education;Patient/family education;Orthotic Fit/Training;Manual techniques;Manual lymph drainage;Passive range of motion;Dry  needling;Energy conservation;Splinting;Taping;Spinal Manipulations;Joint Manipulations    PT Next Visit Plan Progresss with lumbar stabilty exercises. Add gluteal strengthening next session.  Continue core and proximal strengthening, weight bearing to equalize with gait.  Manual for pain control. f/u with aquatics, lumbar traction    PT Home Exercise Plan 02/23/20: ab set; 6/2: LTR and bridges; 03/07/20: diaphragmatic breathing.;6/28 90/90 supportive position and leg press into wall; 04/06/2020: decomression exercises    Consulted and Agree with Plan of Care Patient           Patient will benefit from skilled therapeutic intervention in order to improve the following deficits and impairments:  Abnormal gait, Decreased activity tolerance, Decreased balance, Decreased mobility, Decreased endurance, Decreased range of motion, Decreased strength, Difficulty walking, Increased muscle spasms, Impaired perceived functional ability, Impaired sensation, Improper body mechanics, Pain  Visit Diagnosis: Other abnormalities of gait and mobility  Other symptoms and signs involving the musculoskeletal system  Low back pain, unspecified back pain laterality, unspecified chronicity, unspecified whether sciatica present  Muscle weakness (generalized)     Problem List Patient Active Problem List   Diagnosis Date Noted  . Chronic diastolic HF (heart failure) ([place]) 08/10/2018  . GERD (gastroesophageal reflux disease) 08/09/2018  . Unstable angina ([place]) 08/09/2018  . Carotid artery disease ([place]) 07/24/2017  . AAA (abdominal aortic aneurysm) without rupture ([place]) 01/05/2013  . Other malaise and fatigue 01/05/2013  . Palpitations 01/05/2013  . Tobacco use disorder 12/02/2012  . Coronary artery disease   . Hyperlipidemia   . Hypertension   . SHORTNESS OF BREATH 09/04/2009  . CHEST PAIN 09/04/2009     4:57 PM, 04/12/20 Jerene Pitch, DPT Physical Therapy with [facility]    [phone] office  [place] [address] [place], [place], [address] Phone:  [phone]   Fax:  [phone]  Name: Shannon [name] MRN: [id number] Date of Birth: 12-30-1965

## 2020-04-13 ENCOUNTER — Encounter (HOSPITAL_COMMUNITY): Payer: Medicare HMO | Admitting: Physical Therapy

## 2020-04-18 ENCOUNTER — Encounter (HOSPITAL_COMMUNITY): Payer: Self-pay | Admitting: Physical Therapy

## 2020-04-18 ENCOUNTER — Other Ambulatory Visit: Payer: Self-pay

## 2020-04-18 ENCOUNTER — Ambulatory Visit (HOSPITAL_COMMUNITY): Payer: Medicare HMO | Admitting: Physical Therapy

## 2020-04-18 DIAGNOSIS — M545 Low back pain, unspecified: Secondary | ICD-10-CM

## 2020-04-18 DIAGNOSIS — M6281 Muscle weakness (generalized): Secondary | ICD-10-CM

## 2020-04-18 DIAGNOSIS — R29898 Other symptoms and signs involving the musculoskeletal system: Secondary | ICD-10-CM | POA: Diagnosis not present

## 2020-04-18 DIAGNOSIS — R2689 Other abnormalities of gait and mobility: Secondary | ICD-10-CM | POA: Diagnosis not present

## 2020-04-18 NOTE — Therapy (Signed)
[place], [place], [address] Phone: [phone]   Fax:  [phone]  Physical Therapy Treatment  Patient Details  Name: Shannon [name] MRN: [id number] Date of Birth: [birth year] Referring Provider (PT): Levy Pupa PA-C   Encounter Date: 04/18/2020   PT End of Session - 04/18/20 1409    Visit Number 10    Number of Visits 20    Date for PT Re-Evaluation 05/06/20    Authorization Type [employer] 12 visits approved time ran our requested 12 more on 04/06/2020    Authorization Time Period 12 visits approved 7/9 to 8/23    Authorization - Visit Number 3    Authorization - Number of Visits 12   time period ended requested additional visits   Progress Note Due on Visit 18    PT Start Time 1411   late to session   PT Stop Time 1440    PT Time Calculation (min) 29 min    Activity Tolerance Patient limited by pain;No increased pain    Behavior During Therapy WFL for tasks assessed/performed           Past Medical History:  Diagnosis Date  . Anemia   . Anxiety   . Arthritis    oa needs hip replacement on right  . Cancer ([place])    squamous cell areas removed from vulva and pre caner areas removed from leg   . Carotid artery occlusion   . Chronic diastolic HF (heart failure) ([place])    pt denies  . COPD (chronic obstructive pulmonary disease) ([place])    pt denies  . Coronary artery disease    LHC 11/25/12 90% stenosis mid LCx & otherwise nonobstructive dz w/ EF 60-65% S/p PTCA/DES to LCx  . Depression   . Elevated cholesterol   . Exertional dyspnea    chronic  . GERD (gastroesophageal reflux disease)   . History of cardiovascular stress test 06/2015   low risk  . HPV in female   . Hyperlipidemia   . Hypertension   . MI (myocardial infarction) ([place]) fe 17, 2014  . Obesity   . Polycystic ovary disease   . Skin tear of upper arm without complication, left, sequela    small skin tear left upper arm no  drainage for 1 week  . Tobacco abuse     Past Surgical History:  Procedure Laterality Date  . CORONARY ANGIOPLASTY WITH STENT PLACEMENT    . LEFT HEART CATH AND CORONARY ANGIOGRAPHY N/A 08/09/2018   Procedure: LEFT HEART CATH AND CORONARY ANGIOGRAPHY;  Surgeon: Nelva Bush, MD;  Location: [place] [facility];  Service: Cardiovascular;  Laterality: N/A;  . LEFT HEART CATHETERIZATION WITH CORONARY ANGIOGRAM N/A 11/15/2012   Procedure: LEFT HEART CATHETERIZATION WITH CORONARY ANGIOGRAM;  Surgeon: Thayer Headings, MD;  Location: [facility] CATH LAB;  Service: Cardiovascular;  Laterality: N/A;  . PERCUTANEOUS CORONARY STENT INTERVENTION (PCI-S)  11/15/2012   Procedure: PERCUTANEOUS CORONARY STENT INTERVENTION (PCI-S);  Surgeon: Thayer Headings, MD;  Location: [facility] CATH LAB;  Service: Cardiovascular;;  . skin cancer removed right lower leg Right   . TONSILLECTOMY    . uterus ablation    . VULVECTOMY N/A 11/08/2019   Procedure: excision of VULVA, vulvoscopy;  Surgeon: Dian Queen, MD;  Location: [facility];  Service: Gynecology;  Laterality: N/A;    There were no vitals filed for this visit.   Subjective Assessment - 04/18/20 1413    Subjective States  she has felt better since last session reporting 5-6/10 pain.    Pertinent History blocked carotid artery, chronic LBP, hip pain    Limitations Sitting;Lifting;Standing;Walking;House hold activities    How long can you sit comfortably? able to sit for an hour with an upright chair.    How long can you stand comfortably? Able to stand for 30 minutes was unable    How long can you walk comfortably? due to hip and back pain walking is uncomfortable immediately.    Patient Stated Goals get out of some of the pain.    Pain Onset More than a month ago              [facility] PT Assessment - 04/18/20 0001      Assessment   Medical Diagnosis Spondylosis, Lumbar region    Referring Provider (PT) Levy Pupa PA-C    Onset Date/Surgical  Date 02/21/19                         [facility] Adult PT Treatment/Exercise - 04/18/20 0001      Lumbar Exercises: Stretches   Double Knee to Chest Stretch --   x20 with ball - timed with breath   Lower Trunk Rotation --   5  minutes total PT performed on green ball.      Lumbar Exercises: Supine   Other Supine Lumbar Exercises 90/90 supported on ball x25 5" holds posterior pelvic tilts.     Other Supine Lumbar Exercises self traction on ball - ham iso - 2x5 10" holds each       Manual Therapy   Manual Therapy Manual Traction    Manual therapy comments Manual treatment performed independant of other activity     Manual Traction with green ball 2x15, 10-30 seconds holds                     PT Short Term Goals - 04/06/20 1122      PT SHORT TERM GOAL #1   Title Patient will be independent with HEP in order to improve functional outcomes.    Time 3    Period Weeks    Status On-going    Target Date 03/13/20      PT SHORT TERM GOAL #2   Title Patient will report at least 25% improvement in symptoms for improved quality of life.    Time 3    Period Weeks    Status On-going    Target Date 03/13/20             PT Long Term Goals - 04/06/20 1122      PT LONG TERM GOAL #1   Title Patient will report at least 75% improvement in symptoms for improved quality of life.    Time 6    Period Weeks    Status On-going      PT LONG TERM GOAL #2   Title Patient will improve FOTO score by at least 5 points in order to indicate improved tolerance to activity.    Time 6    Period Weeks    Status On-going      PT LONG TERM GOAL #3   Title Patient will be able to complete 5x STS in under 11.4 seconds in order to reduce the risk of falls.    Time 6    Period Weeks    Status On-going      PT LONG TERM GOAL #4  Title Patient will demonstrate at least 25% improvement in lumbar ROM in all planes for improved ability to move trunk for housework.    Time 6     Period Weeks    Status On-going                 Plan - 04/18/20 1416    Clinical Impression Statement Patient interested in getting exercise ball for home use, educated patient on ball size needed to get. Continued with decompression exercises and traction. Tolerated this well. Will continue to progress core strengthening as tolerated and traction exercises. Will progress ball exercises as patient to invest in ball for home use.    Personal Factors and Comorbidities Comorbidity 3+;Fitness;Past/Current Experience;Time since onset of injury/illness/exacerbation;Social Background    Comorbidities anxiety, depression, HTN, carotid artery blockage on L, chronic LBP, chronic hip pain, sedentary lifestyle    Examination-Activity Limitations Bathing;Bed Mobility;Bend;Caring for Others;Carry;Continence;Dressing;Hygiene/Grooming;Lift;Locomotion Level;Sit;Squat;Stairs;Stand;Transfers    Examination-Participation Restrictions Cleaning;Church;Community Activity;Driving;Meal Prep;Shop;Volunteer;Yard Work    [employer] officer Evolving/Moderate complexity    Rehab Potential Fair    PT Frequency 2x / week    PT Duration --   12 weeks   PT Treatment/Interventions ADLs/Self Care Home Management;Aquatic Therapy;Biofeedback;Cryotherapy;Electrical Stimulation;Iontophoresis 4mg /ml Dexamethasone;Moist Heat;Traction;Ultrasound;DME Instruction;Gait training;Stair training;Functional mobility training;Therapeutic activities;Therapeutic exercise;Balance training;Neuromuscular re-education;Patient/family education;Orthotic Fit/Training;Manual techniques;Manual lymph drainage;Passive range of motion;Dry needling;Energy conservation;Splinting;Taping;Spinal Manipulations;Joint Manipulations    PT Next Visit Plan Progresss with lumbar stabilty exercises. Add gluteal strengthening next session.  Continue core and proximal strengthening, weight bearing to equalize with gait.  Manual for pain control. f/u with  aquatics, lumbar traction    PT Home Exercise Plan 02/23/20: ab set; 6/2: LTR and bridges; 03/07/20: diaphragmatic breathing.;6/28 90/90 supportive position and leg press into wall; 04/06/2020: decomression exercises    Consulted and Agree with Plan of Care Patient           Patient will benefit from skilled therapeutic intervention in order to improve the following deficits and impairments:  Abnormal gait, Decreased activity tolerance, Decreased balance, Decreased mobility, Decreased endurance, Decreased range of motion, Decreased strength, Difficulty walking, Increased muscle spasms, Impaired perceived functional ability, Impaired sensation, Improper body mechanics, Pain  Visit Diagnosis: Other abnormalities of gait and mobility  Other symptoms and signs involving the musculoskeletal system  Low back pain, unspecified back pain laterality, unspecified chronicity, unspecified whether sciatica present  Muscle weakness (generalized)     Problem List Patient Active Problem List   Diagnosis Date Noted  . Chronic diastolic HF (heart failure) ([place]) 08/10/2018  . GERD (gastroesophageal reflux disease) 08/09/2018  . Unstable angina ([place]) 08/09/2018  . Carotid artery disease ([place]) 07/24/2017  . AAA (abdominal aortic aneurysm) without rupture ([place]) 01/05/2013  . Other malaise and fatigue 01/05/2013  . Palpitations 01/05/2013  . Tobacco use disorder 12/02/2012  . Coronary artery disease   . Hyperlipidemia   . Hypertension   . SHORTNESS OF BREATH 09/04/2009  . CHEST PAIN 09/04/2009    2:46 PM, 04/18/20 Jerene Pitch, DPT Physical Therapy with [facility]  [phone] office  [place] [address] [place], [place], [address] Phone: [phone]   Fax:  [phone]  Name: Shannon [name] MRN: [id number] Date of Birth: July 10, 1966

## 2020-04-19 ENCOUNTER — Ambulatory Visit (HOSPITAL_COMMUNITY): Payer: Medicare HMO | Admitting: Physical Therapy

## 2020-04-20 ENCOUNTER — Telehealth (HOSPITAL_COMMUNITY): Payer: Self-pay | Admitting: Physical Therapy

## 2020-04-20 ENCOUNTER — Encounter (HOSPITAL_COMMUNITY): Payer: Medicare HMO | Admitting: Physical Therapy

## 2020-04-20 NOTE — Telephone Encounter (Signed)
pt did call to cancel she was not feeling well i forgot to message you earlier

## 2020-04-20 NOTE — Telephone Encounter (Signed)
Called patient about missed apt. She stated she had called to cancel her apt today as her hips were really sore but that she felt better in her back after last session. Confirmed follow up apt.   11:01 AM, 04/20/20 Jerene Pitch, DPT Physical Therapy with [facility]  [phone] office

## 2020-04-24 ENCOUNTER — Encounter (HOSPITAL_COMMUNITY): Payer: Medicare HMO

## 2020-04-25 ENCOUNTER — Ambulatory Visit (HOSPITAL_COMMUNITY): Payer: Medicare HMO | Admitting: Physical Therapy

## 2020-04-25 ENCOUNTER — Other Ambulatory Visit: Payer: Self-pay

## 2020-04-25 ENCOUNTER — Telehealth: Payer: Self-pay | Admitting: Dermatology

## 2020-04-25 DIAGNOSIS — M545 Low back pain, unspecified: Secondary | ICD-10-CM

## 2020-04-25 DIAGNOSIS — M6281 Muscle weakness (generalized): Secondary | ICD-10-CM

## 2020-04-25 DIAGNOSIS — R2689 Other abnormalities of gait and mobility: Secondary | ICD-10-CM

## 2020-04-25 DIAGNOSIS — R29898 Other symptoms and signs involving the musculoskeletal system: Secondary | ICD-10-CM | POA: Diagnosis not present

## 2020-04-25 NOTE — Telephone Encounter (Signed)
Pathology report given to patient.

## 2020-04-25 NOTE — Therapy (Signed)
[place], [place], [address] Phone: [phone]   Fax:  [phone]  Physical Therapy Treatment  Patient Details  Name: Shannon [name] MRN: [id number] Date of Birth: 1966-06-25 Referring Provider (PT): Levy Pupa PA-C   Encounter Date: 04/25/2020   PT End of Session - 04/25/20 1647    Visit Number 11    Number of Visits 20    Date for PT Re-Evaluation 05/06/20    Authorization Type [employer] 12 visits approved time ran our requested 12 more on 04/06/2020    Authorization Time Period 12 visits approved 7/9 to 8/23    Authorization - Visit Number 4    Authorization - Number of Visits 12   time period ended requested additional visits   Progress Note Due on Visit 18    PT Start Time 1620    PT Stop Time 1700    PT Time Calculation (min) 40 min    Activity Tolerance Patient limited by pain;No increased pain    Behavior During Therapy WFL for tasks assessed/performed           Past Medical History:  Diagnosis Date  . Anemia   . Anxiety   . Arthritis    oa needs hip replacement on right  . Cancer ([place])    squamous cell areas removed from vulva and pre caner areas removed from leg   . Carotid artery occlusion   . Chronic diastolic HF (heart failure) ([place])    pt denies  . COPD (chronic obstructive pulmonary disease) ([place])    pt denies  . Coronary artery disease    LHC 11/25/12 90% stenosis mid LCx & otherwise nonobstructive dz w/ EF 60-65% S/p PTCA/DES to LCx  . Depression   . Elevated cholesterol   . Exertional dyspnea    chronic  . GERD (gastroesophageal reflux disease)   . History of cardiovascular stress test 06/2015   low risk  . HPV in female   . Hyperlipidemia   . Hypertension   . MI (myocardial infarction) ([place]) fe 17, 2014  . Obesity   . Polycystic ovary disease   . Skin tear of upper arm without complication, left, sequela    small skin tear left upper arm no drainage for 1 week  .  Tobacco abuse     Past Surgical History:  Procedure Laterality Date  . CORONARY ANGIOPLASTY WITH STENT PLACEMENT    . LEFT HEART CATH AND CORONARY ANGIOGRAPHY N/A 08/09/2018   Procedure: LEFT HEART CATH AND CORONARY ANGIOGRAPHY;  Surgeon: Nelva Bush, MD;  Location: [place] [facility];  Service: Cardiovascular;  Laterality: N/A;  . LEFT HEART CATHETERIZATION WITH CORONARY ANGIOGRAM N/A 11/15/2012   Procedure: LEFT HEART CATHETERIZATION WITH CORONARY ANGIOGRAM;  Surgeon: Thayer Headings, MD;  Location: [facility] CATH LAB;  Service: Cardiovascular;  Laterality: N/A;  . PERCUTANEOUS CORONARY STENT INTERVENTION (PCI-S)  11/15/2012   Procedure: PERCUTANEOUS CORONARY STENT INTERVENTION (PCI-S);  Surgeon: Thayer Headings, MD;  Location: [facility] CATH LAB;  Service: Cardiovascular;;  . skin cancer removed right lower leg Right   . TONSILLECTOMY    . uterus ablation    . VULVECTOMY N/A 11/08/2019   Procedure: excision of VULVA, vulvoscopy;  Surgeon: Dian Queen, MD;  Location: [facility];  Service: Gynecology;  Laterality: N/A;    There were no vitals filed for this visit.   Subjective Assessment - 04/25/20 1626    Subjective Pt states that she has  been doing her home exercises.    Pertinent History blocked carotid artery, chronic LBP, hip pain    Limitations Sitting;Lifting;Standing;Walking;House hold activities    How long can you sit comfortably? able to sit for an hour with an upright chair.    How long can you stand comfortably? Able to stand for 30 minutes was unable    How long can you walk comfortably? due to hip and back pain walking is uncomfortable immediately.    Patient Stated Goals get out of some of the pain.    Currently in Pain? Yes    Pain Score 5     Pain Location Back    Pain Orientation Mid    Pain Descriptors / Indicators Aching    Pain Type Chronic pain    Pain Onset More than a month ago    Pain Frequency Constant    Aggravating Factors  changing positions     Pain Relieving Factors meds    Effect of Pain on Daily Activities limits                             [facility] Adult PT Treatment/Exercise - 04/25/20 0001      Exercises   Exercises Lumbar      Lumbar Exercises: Stretches   Active Hamstring Stretch Right;Left;3 reps;20 seconds    Double Knee to Chest Stretch 3 reps;30 seconds    Lower Trunk Rotation 5 reps    Figure 4 Stretch 1 rep;60 seconds;Supine;Limitations    Figure 4 Stretch Limitations deep breathing for relaxation       Lumbar Exercises: Standing   Heel Raises 10 reps    Functional Squats 10 reps    Forward Lunge 10 reps    Other Standing Lumbar Exercises wall arch x 5       Lumbar Exercises: Seated   Other Seated Lumbar Exercises thoracic excursions x 3       Lumbar Exercises: Supine   Bent Knee Raise 5 reps;5 seconds    Bridge 10 reps    Bridge Limitations on ball     Other Supine Lumbar Exercises self traction on ball - ham iso - 2x5 10" holds each                     PT Short Term Goals - 04/06/20 1122      PT SHORT TERM GOAL #1   Title Patient will be independent with HEP in order to improve functional outcomes.    Time 3    Period Weeks    Status On-going    Target Date 03/13/20      PT SHORT TERM GOAL #2   Title Patient will report at least 25% improvement in symptoms for improved quality of life.    Time 3    Period Weeks    Status On-going    Target Date 03/13/20             PT Long Term Goals - 04/06/20 1122      PT LONG TERM GOAL #1   Title Patient will report at least 75% improvement in symptoms for improved quality of life.    Time 6    Period Weeks    Status On-going      PT LONG TERM GOAL #2   Title Patient will improve FOTO score by at least 5 points in order to indicate improved tolerance to activity.  Time 6    Period Weeks    Status On-going      PT LONG TERM GOAL #3   Title Patient will be able to complete 5x STS in under 11.4 seconds in  order to reduce the risk of falls.    Time 6    Period Weeks    Status On-going      PT LONG TERM GOAL #4   Title Patient will demonstrate at least 25% improvement in lumbar ROM in all planes for improved ability to move trunk for housework.    Time 6    Period Weeks    Status On-going                 Plan - 04/25/20 1648    Clinical Impression Statement Initiated functional stability exercises with good tolerance.  Added sitting ball stretches for low back with good tolerance.    Personal Factors and Comorbidities Comorbidity 3+;Fitness;Past/Current Experience;Time since onset of injury/illness/exacerbation;Social Background    Comorbidities anxiety, depression, HTN, carotid artery blockage on L, chronic LBP, chronic hip pain, sedentary lifestyle    Examination-Activity Limitations Bathing;Bed Mobility;Bend;Caring for Others;Carry;Continence;Dressing;Hygiene/Grooming;Lift;Locomotion Level;Sit;Squat;Stairs;Stand;Transfers    Examination-Participation Restrictions Cleaning;Church;Community Activity;Driving;Meal Prep;Shop;Volunteer;Yard Work    [employer]chant [employer] officer Evolving/Moderate complexity    Rehab Potential Fair    PT Frequency 2x / week    PT Duration --   12 weeks   PT Treatment/Interventions ADLs/Self Care Home Management;Aquatic Therapy;Biofeedback;Cryotherapy;Electrical Stimulation;Iontophoresis 4mg /ml Dexamethasone;Moist Heat;Traction;Ultrasound;DME Instruction;Gait training;Stair training;Functional mobility training;Therapeutic activities;Therapeutic exercise;Balance training;Neuromuscular re-education;Patient/family education;Orthotic Fit/Training;Manual techniques;Manual lymph drainage;Passive range of motion;Dry needling;Energy conservation;Splinting;Taping;Spinal Manipulations;Joint Manipulations    PT Next Visit Plan Progresss with lumbar stabilty exercises..  Continue core and proximal strengthening, weight bearing to equalize with gait.  Manual for pain  control. f/u with aquatics, lumbar traction    PT Home Exercise Plan 02/23/20: ab set; 6/2: LTR and bridges; 03/07/20: diaphragmatic breathing.;6/28 90/90 supportive position and leg press into wall; 04/06/2020: decomression exercises    Consulted and Agree with Plan of Care Patient           Patient will benefit from skilled therapeutic intervention in order to improve the following deficits and impairments:  Abnormal gait, Decreased activity tolerance, Decreased balance, Decreased mobility, Decreased endurance, Decreased range of motion, Decreased strength, Difficulty walking, Increased muscle spasms, Impaired perceived functional ability, Impaired sensation, Improper body mechanics, Pain  Visit Diagnosis: Other abnormalities of gait and mobility  Other symptoms and signs involving the musculoskeletal system  Low back pain, unspecified back pain laterality, unspecified chronicity, unspecified whether sciatica present  Muscle weakness (generalized)     Problem List Patient Active Problem List   Diagnosis Date Noted  . Chronic diastolic HF (heart failure) ([place]) 08/10/2018  . GERD (gastroesophageal reflux disease) 08/09/2018  . Unstable angina ([place]) 08/09/2018  . Carotid artery disease ([place]) 07/24/2017  . AAA (abdominal aortic aneurysm) without rupture ([place]) 01/05/2013  . Other malaise and fatigue 01/05/2013  . Palpitations 01/05/2013  . Tobacco use disorder 12/02/2012  . Coronary artery disease   . Hyperlipidemia   . Hypertension   . SHORTNESS OF BREATH 09/04/2009  . CHEST PAIN 09/04/2009  Rayetta Humphrey, PT CLT [phone] 04/25/2020, 5:06 PM  [facility] [address] [place], [place], [address] Phone: [phone]   Fax:  [phone]  Name: ROZENA FIERRO MRN: [id number] Date of Birth: 03-31-1966

## 2020-04-25 NOTE — Telephone Encounter (Signed)
Patient calling for biopsy results

## 2020-04-26 ENCOUNTER — Ambulatory Visit (HOSPITAL_COMMUNITY): Payer: Medicare HMO | Admitting: Physical Therapy

## 2020-04-26 ENCOUNTER — Encounter (HOSPITAL_COMMUNITY): Payer: Self-pay | Admitting: Physical Therapy

## 2020-04-26 DIAGNOSIS — M545 Low back pain, unspecified: Secondary | ICD-10-CM

## 2020-04-26 DIAGNOSIS — M6281 Muscle weakness (generalized): Secondary | ICD-10-CM | POA: Diagnosis not present

## 2020-04-26 DIAGNOSIS — R2689 Other abnormalities of gait and mobility: Secondary | ICD-10-CM

## 2020-04-26 DIAGNOSIS — R29898 Other symptoms and signs involving the musculoskeletal system: Secondary | ICD-10-CM

## 2020-04-26 NOTE — Therapy (Signed)
[place], [place], [address] Phone: [phone]   Fax:  [phone]  Physical Therapy Treatment  Patient Details  Name: Shannon [name] MRN: [id number] Date of Birth: 1965-11-16 Referring Provider (PT): Levy Pupa PA-C   Encounter Date: 04/26/2020   PT End of Session - 04/26/20 1322    Visit Number 12    Number of Visits 20    Date for PT Re-Evaluation 05/06/20    Authorization Type [employer] 12 visits approved time ran our requested 12 more on 04/06/2020    Authorization Time Period 12 visits approved 7/9 to 8/23    Authorization - Visit Number 5    Authorization - Number of Visits 12   time period ended requested additional visits   Progress Note Due on Visit 18    PT Start Time 1316    PT Stop Time 1400    PT Time Calculation (min) 44 min    Activity Tolerance Patient limited by pain;No increased pain    Behavior During Therapy WFL for tasks assessed/performed           Past Medical History:  Diagnosis Date  . Anemia   . Anxiety   . Arthritis    oa needs hip replacement on right  . Cancer ([place])    squamous cell areas removed from vulva and pre caner areas removed from leg   . Carotid artery occlusion   . Chronic diastolic HF (heart failure) ([place])    pt denies  . COPD (chronic obstructive pulmonary disease) ([place])    pt denies  . Coronary artery disease    LHC 11/25/12 90% stenosis mid LCx & otherwise nonobstructive dz w/ EF 60-65% S/p PTCA/DES to LCx  . Depression   . Elevated cholesterol   . Exertional dyspnea    chronic  . GERD (gastroesophageal reflux disease)   . History of cardiovascular stress test 06/2015   low risk  . HPV in female   . Hyperlipidemia   . Hypertension   . MI (myocardial infarction) ([place]) fe 17, 2014  . Obesity   . Polycystic ovary disease   . Skin tear of upper arm without complication, left, sequela    small skin tear left upper arm no drainage for 1 week  .  Tobacco abuse     Past Surgical History:  Procedure Laterality Date  . CORONARY ANGIOPLASTY WITH STENT PLACEMENT    . LEFT HEART CATH AND CORONARY ANGIOGRAPHY N/A 08/09/2018   Procedure: LEFT HEART CATH AND CORONARY ANGIOGRAPHY;  Surgeon: Nelva Bush, MD;  Location: [place] [facility];  Service: Cardiovascular;  Laterality: N/A;  . LEFT HEART CATHETERIZATION WITH CORONARY ANGIOGRAM N/A 11/15/2012   Procedure: LEFT HEART CATHETERIZATION WITH CORONARY ANGIOGRAM;  Surgeon: Thayer Headings, MD;  Location: [facility] CATH LAB;  Service: Cardiovascular;  Laterality: N/A;  . PERCUTANEOUS CORONARY STENT INTERVENTION (PCI-S)  11/15/2012   Procedure: PERCUTANEOUS CORONARY STENT INTERVENTION (PCI-S);  Surgeon: Thayer Headings, MD;  Location: [facility] CATH LAB;  Service: Cardiovascular;;  . skin cancer removed right lower leg Right   . TONSILLECTOMY    . uterus ablation    . VULVECTOMY N/A 11/08/2019   Procedure: excision of VULVA, vulvoscopy;  Surgeon: Dian Queen, MD;  Location: [facility];  Service: Gynecology;  Laterality: N/A;    There were no vitals filed for this visit.   Subjective Assessment - 04/26/20 1322    Subjective States she was/is very sore  since last session (yesterday). States that she did a lot of work yesterday and she had to take pain meds last night and this morning. States that she was doing really well prior to yesterday and only had pain meds once before yesterday.    Pertinent History blocked carotid artery, chronic LBP, hip pain    Limitations Sitting;Lifting;Standing;Walking;House hold activities    How long can you sit comfortably? able to sit for an hour with an upright chair.    How long can you stand comfortably? Able to stand for 30 minutes was unable    How long can you walk comfortably? due to hip and back pain walking is uncomfortable immediately.    Patient Stated Goals get out of some of the pain.    Currently in Pain? Yes    Pain Score 8     Pain  Location Back    Pain Orientation Right;Lower;Mid    Pain Descriptors / Indicators Sore    Pain Type Chronic pain    Pain Onset More than a month ago              [facility] PT Assessment - 04/26/20 0001      Assessment   Medical Diagnosis Spondylosis, Lumbar region    Referring Provider (PT) Levy Pupa PA-C    Onset Date/Surgical Date 02/21/19                         [facility] Adult PT Treatment/Exercise - 04/26/20 0001      Lumbar Exercises: Stretches   Double Knee to Chest Stretch 60 seconds;4 reps   timed with breath on ball      Lumbar Exercises: Supine   Other Supine Lumbar Exercises 90/90 supported on ball 2x20 5" holds posterior pelvic tilts.       Manual Therapy   Manual Therapy Manual Traction;Soft tissue mobilization    Manual therapy comments Manual treatment performed independant of other activity     Soft tissue mobilization IASTM to lumbar paraspinals, QL  B with percussion gun    Manual Traction with green ball 2x15, 10-30 seconds holds with and without ROT                   PT Education - 04/26/20 1350    Education Details in current condtion, exercises and rational for different things    Person(s) Educated Patient    Methods Explanation    Comprehension Verbalized understanding            PT Short Term Goals - 04/06/20 1122      PT SHORT TERM GOAL #1   Title Patient will be independent with HEP in order to improve functional outcomes.    Time 3    Period Weeks    Status On-going    Target Date 03/13/20      PT SHORT TERM GOAL #2   Title Patient will report at least 25% improvement in symptoms for improved quality of life.    Time 3    Period Weeks    Status On-going    Target Date 03/13/20             PT Long Term Goals - 04/06/20 1122      PT LONG TERM GOAL #1   Title Patient will report at least 75% improvement in symptoms for improved quality of life.    Time 6    Period Weeks    Status On-going  PT  LONG TERM GOAL #2   Title Patient will improve FOTO score by at least 5 points in order to indicate improved tolerance to activity.    Time 6    Period Weeks    Status On-going      PT LONG TERM GOAL #3   Title Patient will be able to complete 5x STS in under 11.4 seconds in order to reduce the risk of falls.    Time 6    Period Weeks    Status On-going      PT LONG TERM GOAL #4   Title Patient will demonstrate at least 25% improvement in lumbar ROM in all planes for improved ability to move trunk for housework.    Time 6    Period Weeks    Status On-going                 Plan - 04/26/20 1349    Clinical Impression Statement Patient tolerated session well. Less pain noted end of session. Will continue to perform traction, ROM and strengthening exercises as tolerated. Reassured patient about current condition and POC.    Personal Factors and Comorbidities Comorbidity 3+;Fitness;Past/Current Experience;Time since onset of injury/illness/exacerbation;Social Background    Comorbidities anxiety, depression, HTN, carotid artery blockage on L, chronic LBP, chronic hip pain, sedentary lifestyle    Examination-Activity Limitations Bathing;Bed Mobility;Bend;Caring for Others;Carry;Continence;Dressing;Hygiene/Grooming;Lift;Locomotion Level;Sit;Squat;Stairs;Stand;Transfers    Examination-Participation Restrictions Cleaning;Church;Community Activity;Driving;Meal Prep;Shop;Volunteer;Yard Work    [employer]chant [employer] officer Evolving/Moderate complexity    Rehab Potential Fair    PT Frequency 2x / week    PT Duration --   12 weeks   PT Treatment/Interventions ADLs/Self Care Home Management;Aquatic Therapy;Biofeedback;Cryotherapy;Electrical Stimulation;Iontophoresis 4mg /ml Dexamethasone;Moist Heat;Traction;Ultrasound;DME Instruction;Gait training;Stair training;Functional mobility training;Therapeutic activities;Therapeutic exercise;Balance training;Neuromuscular  re-education;Patient/family education;Orthotic Fit/Training;Manual techniques;Manual lymph drainage;Passive range of motion;Dry needling;Energy conservation;Splinting;Taping;Spinal Manipulations;Joint Manipulations    PT Next Visit Plan Progresss with lumbar stabilty exercises..  Continue core and proximal strengthening, weight bearing to equalize with gait.  Manual for pain control.  lumbar traction    PT Home Exercise Plan 02/23/20: ab set; 6/2: LTR and bridges; 03/07/20: diaphragmatic breathing.;6/28 90/90 supportive position and leg press into wall; 04/06/2020: decomression exercises    Consulted and Agree with Plan of Care Patient           Patient will benefit from skilled therapeutic intervention in order to improve the following deficits and impairments:  Abnormal gait, Decreased activity tolerance, Decreased balance, Decreased mobility, Decreased endurance, Decreased range of motion, Decreased strength, Difficulty walking, Increased muscle spasms, Impaired perceived functional ability, Impaired sensation, Improper body mechanics, Pain  Visit Diagnosis: Other abnormalities of gait and mobility  Other symptoms and signs involving the musculoskeletal system  Low back pain, unspecified back pain laterality, unspecified chronicity, unspecified whether sciatica present  Muscle weakness (generalized)     Problem List Patient Active Problem List   Diagnosis Date Noted  . Chronic diastolic HF (heart failure) ([place]) 08/10/2018  . GERD (gastroesophageal reflux disease) 08/09/2018  . Unstable angina ([place]) 08/09/2018  . Carotid artery disease ([place]) 07/24/2017  . AAA (abdominal aortic aneurysm) without rupture ([place]) 01/05/2013  . Other malaise and fatigue 01/05/2013  . Palpitations 01/05/2013  . Tobacco use disorder 12/02/2012  . Coronary artery disease   . Hyperlipidemia   . Hypertension   . SHORTNESS OF BREATH 09/04/2009  . CHEST PAIN 09/04/2009   3:49 PM, 04/26/20 Jerene Pitch,  DPT Physical Therapy with [facility]  [phone] office  [facility]  [place], [place], [address] Phone: [phone]   Fax:  [phone]  Name: Shannon [name] MRN: [id number] Date of Birth: 06/30/1966

## 2020-05-02 ENCOUNTER — Other Ambulatory Visit: Payer: Self-pay

## 2020-05-02 ENCOUNTER — Ambulatory Visit (HOSPITAL_COMMUNITY): Payer: Medicare HMO | Attending: Chiropractic Medicine | Admitting: Physical Therapy

## 2020-05-02 ENCOUNTER — Encounter (HOSPITAL_COMMUNITY): Payer: Self-pay | Admitting: Physical Therapy

## 2020-05-02 DIAGNOSIS — M6281 Muscle weakness (generalized): Secondary | ICD-10-CM | POA: Insufficient documentation

## 2020-05-02 DIAGNOSIS — R2689 Other abnormalities of gait and mobility: Secondary | ICD-10-CM | POA: Diagnosis not present

## 2020-05-02 DIAGNOSIS — M545 Low back pain, unspecified: Secondary | ICD-10-CM

## 2020-05-02 DIAGNOSIS — R29898 Other symptoms and signs involving the musculoskeletal system: Secondary | ICD-10-CM | POA: Diagnosis not present

## 2020-05-02 NOTE — Therapy (Signed)
[place], [place], [address] Phone: [phone]   Fax:  [phone]  Physical Therapy Treatment  Patient Details  Name: Shannon [name] MRN: [id number] Date of Birth: [birth year] Referring Provider (PT): Levy Pupa PA-C   Encounter Date: 05/02/2020   PT End of Session - 05/02/20 1014    Visit Number 13    Number of Visits 20    Date for PT Re-Evaluation 05/06/20    Authorization Type [employer] 12 visits approved time ran our requested 12 more on 04/06/2020    Authorization Time Period 12 visits approved 7/9 to 8/23    Authorization - Visit Number 6    Authorization - Number of Visits 12   time period ended requested additional visits   Progress Note Due on Visit 18    PT Start Time 1015   pt late to session   PT Stop Time 1040    PT Time Calculation (min) 25 min    Activity Tolerance Patient limited by pain;No increased pain    Behavior During Therapy WFL for tasks assessed/performed           Past Medical History:  Diagnosis Date  . Anemia   . Anxiety   . Arthritis    oa needs hip replacement on right  . Cancer ([place])    squamous cell areas removed from vulva and pre caner areas removed from leg   . Carotid artery occlusion   . Chronic diastolic HF (heart failure) ([place])    pt denies  . COPD (chronic obstructive pulmonary disease) ([place])    pt denies  . Coronary artery disease    LHC 11/25/12 90% stenosis mid LCx & otherwise nonobstructive dz w/ EF 60-65% S/p PTCA/DES to LCx  . Depression   . Elevated cholesterol   . Exertional dyspnea    chronic  . GERD (gastroesophageal reflux disease)   . History of cardiovascular stress test 06/2015   low risk  . HPV in female   . Hyperlipidemia   . Hypertension   . MI (myocardial infarction) ([place]) fe 17, 2014  . Obesity   . Polycystic ovary disease   . Skin tear of upper arm without complication, left, sequela    small skin tear left upper arm no  drainage for 1 week  . Tobacco abuse     Past Surgical History:  Procedure Laterality Date  . CORONARY ANGIOPLASTY WITH STENT PLACEMENT    . LEFT HEART CATH AND CORONARY ANGIOGRAPHY N/A 08/09/2018   Procedure: LEFT HEART CATH AND CORONARY ANGIOGRAPHY;  Surgeon: Nelva Bush, MD;  Location: [facility];  Service: Cardiovascular;  Laterality: N/A;  . LEFT HEART CATHETERIZATION WITH CORONARY ANGIOGRAM N/A 11/15/2012   Procedure: LEFT HEART CATHETERIZATION WITH CORONARY ANGIOGRAM;  Surgeon: Thayer Headings, MD;  Location: [facility] CATH LAB;  Service: Cardiovascular;  Laterality: N/A;  . PERCUTANEOUS CORONARY STENT INTERVENTION (PCI-S)  11/15/2012   Procedure: PERCUTANEOUS CORONARY STENT INTERVENTION (PCI-S);  Surgeon: Thayer Headings, MD;  Location: [facility] CATH LAB;  Service: Cardiovascular;;  . skin cancer removed right lower leg Right   . TONSILLECTOMY    . uterus ablation    . VULVECTOMY N/A 11/08/2019   Procedure: excision of VULVA, vulvoscopy;  Surgeon: Dian Queen, MD;  Location: [facility];  Service: Gynecology;  Laterality: N/A;    There were no vitals filed for this visit.   Subjective Assessment - 05/02/20 1039    Subjective  States the first couple of days she was really sore but then things are better. States she thinks soreness was from previous session. States current pain is 5/10 .    Pertinent History blocked carotid artery, chronic LBP, hip pain    Limitations Sitting;Lifting;Standing;Walking;House hold activities    How long can you sit comfortably? able to sit for an hour with an upright chair.    How long can you stand comfortably? Able to stand for 30 minutes was unable    How long can you walk comfortably? due to hip and back pain walking is uncomfortable immediately.    Patient Stated Goals get out of some of the pain.    Currently in Pain? Yes    Pain Score 5     Pain Location Back    Pain Orientation Right;Lower;Left    Pain Onset More than a  month ago              [facility] PT Assessment - 05/02/20 0001      Assessment   Medical Diagnosis Spondylosis, Lumbar region    Referring Provider (PT) Levy Pupa PA-C    Onset Date/Surgical Date 02/21/19                         [facility] Adult PT Treatment/Exercise - 05/02/20 0001      Lumbar Exercises: Stretches   Lower Trunk Rotation 5 reps;60 seconds   with ball      Lumbar Exercises: Supine   Other Supine Lumbar Exercises hip add abd abd (ball then belt with TRA activation x10 5" holds E      Manual Therapy   Manual Therapy Manual Traction    Manual therapy comments Manual treatment performed independant of other activity     Manual Traction with green ball 2x15, 10-30 seconds holds with and without ROT                     PT Short Term Goals - 04/06/20 1122      PT SHORT TERM GOAL #1   Title Patient will be independent with HEP in order to improve functional outcomes.    Time 3    Period Weeks    Status On-going    Target Date 03/13/20      PT SHORT TERM GOAL #2   Title Patient will report at least 25% improvement in symptoms for improved quality of life.    Time 3    Period Weeks    Status On-going    Target Date 03/13/20             PT Long Term Goals - 04/06/20 1122      PT LONG TERM GOAL #1   Title Patient will report at least 75% improvement in symptoms for improved quality of life.    Time 6    Period Weeks    Status On-going      PT LONG TERM GOAL #2   Title Patient will improve FOTO score by at least 5 points in order to indicate improved tolerance to activity.    Time 6    Period Weeks    Status On-going      PT LONG TERM GOAL #3   Title Patient will be able to complete 5x STS in under 11.4 seconds in order to reduce the risk of falls.    Time 6    Period Weeks    Status On-going  PT LONG TERM GOAL #4   Title Patient will demonstrate at least 25% improvement in lumbar ROM in all planes for improved ability  to move trunk for housework.    Time 6    Period Weeks    Status On-going                 Plan - 05/02/20 1039    Clinical Impression Statement Limited session today secondary to miscommunication of arrival time. Able to improve muscle relaxation with traction and then added hip isometrics to HEP. No increase in pain noted just soreness in inner thighs. Will continue with established POC.    Personal Factors and Comorbidities Comorbidity 3+;Fitness;Past/Current Experience;Time since onset of injury/illness/exacerbation;Social Background    Comorbidities anxiety, depression, HTN, carotid artery blockage on L, chronic LBP, chronic hip pain, sedentary lifestyle    Examination-Activity Limitations Bathing;Bed Mobility;Bend;Caring for Others;Carry;Continence;Dressing;Hygiene/Grooming;Lift;Locomotion Level;Sit;Squat;Stairs;Stand;Transfers    Examination-Participation Restrictions Cleaning;Church;Community Activity;Driving;Meal Prep;Shop;Volunteer;Yard Work    [employer] officer Evolving/Moderate complexity    Rehab Potential Fair    PT Frequency 2x / week    PT Duration --   12 weeks   PT Treatment/Interventions ADLs/Self Care Home Management;Aquatic Therapy;Biofeedback;Cryotherapy;Electrical Stimulation;Iontophoresis 4mg /ml Dexamethasone;Moist Heat;Traction;Ultrasound;DME Instruction;Gait training;Stair training;Functional mobility training;Therapeutic activities;Therapeutic exercise;Balance training;Neuromuscular re-education;Patient/family education;Orthotic Fit/Training;Manual techniques;Manual lymph drainage;Passive range of motion;Dry needling;Energy conservation;Splinting;Taping;Spinal Manipulations;Joint Manipulations    PT Next Visit Plan Progresss with lumbar stabilty exercises..  Continue core and proximal strengthening, weight bearing to equalize with gait.  Manual for pain control.  lumbar traction- leg lifting up (% of this ) to aid with showering    PT Home Exercise  Plan 02/23/20: ab set; 6/2: LTR and bridges; 03/07/20: diaphragmatic breathing.;6/28 90/90 supportive position and leg press into wall; 04/06/2020: decomression exercises;    Consulted and Agree with Plan of Care Patient           Patient will benefit from skilled therapeutic intervention in order to improve the following deficits and impairments:  Abnormal gait, Decreased activity tolerance, Decreased balance, Decreased mobility, Decreased endurance, Decreased range of motion, Decreased strength, Difficulty walking, Increased muscle spasms, Impaired perceived functional ability, Impaired sensation, Improper body mechanics, Pain  Visit Diagnosis: Other abnormalities of gait and mobility  Other symptoms and signs involving the musculoskeletal system  Low back pain, unspecified back pain laterality, unspecified chronicity, unspecified whether sciatica present  Muscle weakness (generalized)     Problem List Patient Active Problem List   Diagnosis Date Noted  . Chronic diastolic HF (heart failure) ([place]) 08/10/2018  . GERD (gastroesophageal reflux disease) 08/09/2018  . Unstable angina ([place]) 08/09/2018  . Carotid artery disease ([place]) 07/24/2017  . AAA (abdominal aortic aneurysm) without rupture ([place]) 01/05/2013  . Other malaise and fatigue 01/05/2013  . Palpitations 01/05/2013  . Tobacco use disorder 12/02/2012  . Coronary artery disease   . Hyperlipidemia   . Hypertension   . SHORTNESS OF BREATH 09/04/2009  . CHEST PAIN 09/04/2009   10:46 AM, 05/02/20 Jerene Pitch, DPT Physical Therapy with [facility]  [phone] office  [facility] [address] [place], [place], [address] Phone: [phone]   Fax:  [phone]  Name: Shannon [name] MRN: [id number] Date of Birth: 05-20-1966

## 2020-05-03 ENCOUNTER — Ambulatory Visit (HOSPITAL_COMMUNITY): Payer: Medicare HMO | Admitting: Physical Therapy

## 2020-05-03 ENCOUNTER — Telehealth (HOSPITAL_COMMUNITY): Payer: Self-pay | Admitting: Physical Therapy

## 2020-05-03 NOTE — Telephone Encounter (Signed)
pt cancelled appt for today 

## 2020-05-07 ENCOUNTER — Encounter (HOSPITAL_COMMUNITY): Payer: Self-pay | Admitting: Physical Therapy

## 2020-05-07 ENCOUNTER — Other Ambulatory Visit: Payer: Self-pay

## 2020-05-07 ENCOUNTER — Ambulatory Visit (HOSPITAL_COMMUNITY): Payer: Medicare HMO | Admitting: Physical Therapy

## 2020-05-07 DIAGNOSIS — M6281 Muscle weakness (generalized): Secondary | ICD-10-CM | POA: Diagnosis not present

## 2020-05-07 DIAGNOSIS — M545 Low back pain, unspecified: Secondary | ICD-10-CM

## 2020-05-07 DIAGNOSIS — R2689 Other abnormalities of gait and mobility: Secondary | ICD-10-CM | POA: Diagnosis not present

## 2020-05-07 DIAGNOSIS — R29898 Other symptoms and signs involving the musculoskeletal system: Secondary | ICD-10-CM | POA: Diagnosis not present

## 2020-05-07 NOTE — Therapy (Addendum)
[facility] [address] [place], [place], [address] Phone: [phone]   Fax:  [phone]  Physical Therapy Treatment and Progress note  Patient Details  Name: Shannon [name] MRN: [id number] Date of Birth: 03-22-1966 Referring Provider (PT): Levy Pupa PA-C  Progress Note Reporting Period 04/06/20 to 05/07/20  See note below for Objective Data and Assessment of Progress/Goals.       Encounter Date: 05/07/2020   PT End of Session - 05/07/20 1400    Visit Number 14    Number of Visits 22   Date for PT Re-Evaluation 06/04/20   Authorization Type [employer] 12 visits approved time ran our requested 12 more on 04/06/2020    Authorization Time Period 12 visits approved 7/9 to 8/23    Authorization - Visit Number 7    Authorization - Number of Visits 12   time period ended requested additional visits   Progress Note Due on Visit 18    PT Start Time 1400    PT Stop Time 1440    PT Time Calculation (min) 40 min    Activity Tolerance Patient limited by pain;No increased pain    Behavior During Therapy WFL for tasks assessed/performed           Past Medical History:  Diagnosis Date  . Anemia   . Anxiety   . Arthritis    oa needs hip replacement on right  . Cancer ([place])    squamous cell areas removed from vulva and pre caner areas removed from leg   . Carotid artery occlusion   . Chronic diastolic HF (heart failure) ([place])    pt denies  . COPD (chronic obstructive pulmonary disease) ([place])    pt denies  . Coronary artery disease    LHC 11/25/12 90% stenosis mid LCx & otherwise nonobstructive dz w/ EF 60-65% S/p PTCA/DES to LCx  . Depression   . Elevated cholesterol   . Exertional dyspnea    chronic  . GERD (gastroesophageal reflux disease)   . History of cardiovascular stress test 06/2015   low risk  . HPV in female   . Hyperlipidemia   . Hypertension   . MI (myocardial infarction) ([place]) fe 17, 2014  . Obesity   .  Polycystic ovary disease   . Skin tear of upper arm without complication, left, sequela    small skin tear left upper arm no drainage for 1 week  . Tobacco abuse     Past Surgical History:  Procedure Laterality Date  . CORONARY ANGIOPLASTY WITH STENT PLACEMENT    . LEFT HEART CATH AND CORONARY ANGIOGRAPHY N/A 08/09/2018   Procedure: LEFT HEART CATH AND CORONARY ANGIOGRAPHY;  Surgeon: Nelva Bush, MD;  Location: [facility];  Service: Cardiovascular;  Laterality: N/A;  . LEFT HEART CATHETERIZATION WITH CORONARY ANGIOGRAM N/A 11/15/2012   Procedure: LEFT HEART CATHETERIZATION WITH CORONARY ANGIOGRAM;  Surgeon: Thayer Headings, MD;  Location: [facility] CATH LAB;  Service: Cardiovascular;  Laterality: N/A;  . PERCUTANEOUS CORONARY STENT INTERVENTION (PCI-S)  11/15/2012   Procedure: PERCUTANEOUS CORONARY STENT INTERVENTION (PCI-S);  Surgeon: Thayer Headings, MD;  Location: [facility] CATH LAB;  Service: Cardiovascular;;  . skin cancer removed right lower leg Right   . TONSILLECTOMY    . uterus ablation    . VULVECTOMY N/A 11/08/2019   Procedure: excision of VULVA, vulvoscopy;  Surgeon: Dian Queen, MD;  Location: [facility];  Service: Gynecology;  Laterality: N/A;  There were no vitals filed for this visit.   Subjective Assessment - 05/07/20 1404    Subjective States that she is still feeling bad from the shot and had an eye infection recently. States her current pain is about a 6/10. States it is a little better. Patient reports overall she feels about 50% better.    Pertinent History blocked carotid artery, chronic LBP, hip pain    Limitations Sitting;Lifting;Standing;Walking;House hold activities    How long can you sit comfortably? able to sit for an hour with an upright chair.    How long can you stand comfortably? Able to stand for 30 minutes was unable    How long can you walk comfortably? due to hip and back pain walking is uncomfortable immediately.    Patient  Stated Goals get out of some of the pain.    Currently in Pain? Yes    Pain Score 6     Pain Location Back    Pain Orientation Lower;Right    Pain Onset More than a month ago              [facility] PT Assessment - 05/07/20 0001      Assessment   Medical Diagnosis Spondylosis, Lumbar region    Referring Provider (PT) Levy Pupa PA-C    Onset Date/Surgical Date 02/21/19      Observation/Other Assessments   Focus on Therapeutic Outcomes (FOTO)  32% function    was 26% function      AROM   Lumbar Flexion 50% limited   pain across mid back   Lumbar Extension 100% limited   very sore across mid to low back.    Lumbar - Right Side Bend 75% limited pain    Lumbar - Left Side Bend 75% limited pain      Transfers   Five time sit to stand comments  21.9 seconds                         [facility] Adult PT Treatment/Exercise - 05/07/20 0001      Lumbar Exercises: Stretches   Lower Trunk Rotation 60 seconds;3 reps      Lumbar Exercises: Seated   Other Seated Lumbar Exercises seated on dyno disc for posture  40 second holds x5       Lumbar Exercises: Supine   Other Supine Lumbar Exercises 90/90 hamstring isometrics x20 5" holds       Manual Therapy   Manual Therapy Manual Traction    Manual therapy comments Manual treatment performed independant of other activity     Soft tissue mobilization with percussion gun to lumbar paraspinals - 12 minutes     Manual Traction with green ball 2x15, 10-30 seconds holds with and without ROT                     PT Short Term Goals - 05/07/20 1417      PT SHORT TERM GOAL #1   Title Patient will be independent with HEP in order to improve functional outcomes.    Time 3    Period Weeks    Status On-going    Target Date 03/13/20      PT SHORT TERM GOAL #2   Title Patient will report at least 25% improvement in symptoms for improved quality of life.    Time 3    Period Weeks    Status Achieved    Target Date 03/13/20  PT Long Term Goals - 05/07/20 1417      PT LONG TERM GOAL #1   Title Patient will report at least 75% improvement in symptoms for improved quality of life.    Baseline 50% improvement    Time 6    Period Weeks    Status On-going      PT LONG TERM GOAL #2   Title Patient will improve FOTO score by at least 5 points in order to indicate improved tolerance to activity.    Baseline started at 26% function and now at 32% function    Time 6    Period Weeks    Status Achieved      PT LONG TERM GOAL #3   Title Patient will be able to complete 5x STS in under 11.4 seconds in order to reduce the risk of falls.    Time 6    Period Weeks    Status On-going      PT LONG TERM GOAL #4   Title Patient will demonstrate at least 25% improvement in lumbar ROM in all planes for improved ability to move trunk for housework.    Baseline see objective section    Time 6    Period Weeks    Status On-going                 Plan - 05/07/20 1412    Clinical Impression Statement Patient making progress towards foto goal but continues to have pain with most activities. Added seated dyno disc activities and this was tolerated well. Will continue to progress seated posture exercise as tolerated and perform manual/traction as needed for pain interventions.    Personal Factors and Comorbidities Comorbidity 3+;Fitness;Past/Current Experience;Time since onset of injury/illness/exacerbation;Social Background    Comorbidities anxiety, depression, HTN, carotid artery blockage on L, chronic LBP, chronic hip pain, sedentary lifestyle    Examination-Activity Limitations Bathing;Bed Mobility;Bend;Caring for Others;Carry;Continence;Dressing;Hygiene/Grooming;Lift;Locomotion Level;Sit;Squat;Stairs;Stand;Transfers    Examination-Participation Restrictions Cleaning;Church;Community Activity;Driving;Meal Prep;Shop;Volunteer;Yard Work    [employer] officer Evolving/Moderate complexity     Rehab Potential Fair    PT Frequency 2x / week    PT Duration 4 weeks   PT Treatment/Interventions ADLs/Self Care Home Management;Aquatic Therapy;Biofeedback;Cryotherapy;Electrical Stimulation;Iontophoresis 4mg /ml Dexamethasone;Moist Heat;Traction;Ultrasound;DME Instruction;Gait training;Stair training;Functional mobility training;Therapeutic activities;Therapeutic exercise;Balance training;Neuromuscular re-education;Patient/family education;Orthotic Fit/Training;Manual techniques;Manual lymph drainage;Passive range of motion;Dry needling;Energy conservation;Splinting;Taping;Spinal Manipulations;Joint Manipulations    PT Next Visit Plan seated on dyno disc posture, traction/manual as needed for pain relief. Progresss with lumbar stabilty exercises..  Continue core and proximal strengthening, weight bearing to equalize with gait.  Manual for pain control.  lumbar traction- leg lifting up (% of this ) to aid with showering    PT Home Exercise Plan 02/23/20: ab set; 6/2: LTR and bridges; 03/07/20: diaphragmatic breathing.;6/28 90/90 supportive position and leg press into wall; 04/06/2020: decomression exercises;    Consulted and Agree with Plan of Care Patient           Patient will benefit from skilled therapeutic intervention in order to improve the following deficits and impairments:  Abnormal gait, Decreased activity tolerance, Decreased balance, Decreased mobility, Decreased endurance, Decreased range of motion, Decreased strength, Difficulty walking, Increased muscle spasms, Impaired perceived functional ability, Impaired sensation, Improper body mechanics, Pain  Visit Diagnosis: Other abnormalities of gait and mobility  Other symptoms and signs involving the musculoskeletal system  Low back pain, unspecified back pain laterality, unspecified chronicity, unspecified whether sciatica present  Muscle weakness (generalized)     Problem List Patient Active Problem List  Diagnosis Date Noted    . Chronic diastolic HF (heart failure) ([place]) 08/10/2018  . GERD (gastroesophageal reflux disease) 08/09/2018  . Unstable angina ([place]) 08/09/2018  . Carotid artery disease ([place]) 07/24/2017  . AAA (abdominal aortic aneurysm) without rupture ([place]) 01/05/2013  . Other malaise and fatigue 01/05/2013  . Palpitations 01/05/2013  . Tobacco use disorder 12/02/2012  . Coronary artery disease   . Hyperlipidemia   . Hypertension   . SHORTNESS OF BREATH 09/04/2009  . CHEST PAIN 09/04/2009   2:48 PM, 05/07/20 Jerene Pitch, DPT Physical Therapy with [facility]  [phone] office  [facility] [address] [place], [place], [address] Phone: [phone]   Fax:  [phone]  Name: ALFONSO SHACKETT MRN: [id number] Date of Birth: 1966/08/02

## 2020-05-10 ENCOUNTER — Ambulatory Visit (HOSPITAL_COMMUNITY): Payer: Medicare HMO

## 2020-05-10 ENCOUNTER — Other Ambulatory Visit: Payer: Self-pay

## 2020-05-10 ENCOUNTER — Encounter (HOSPITAL_COMMUNITY): Payer: Self-pay

## 2020-05-10 DIAGNOSIS — M6281 Muscle weakness (generalized): Secondary | ICD-10-CM | POA: Diagnosis not present

## 2020-05-10 DIAGNOSIS — M545 Low back pain, unspecified: Secondary | ICD-10-CM

## 2020-05-10 DIAGNOSIS — R29898 Other symptoms and signs involving the musculoskeletal system: Secondary | ICD-10-CM | POA: Diagnosis not present

## 2020-05-10 DIAGNOSIS — R2689 Other abnormalities of gait and mobility: Secondary | ICD-10-CM

## 2020-05-10 NOTE — Therapy (Signed)
[place], [place], [address] Phone: [phone]   Fax:  [phone]  Physical Therapy Treatment  Patient Details  Name: Shannon [name] MRN: [id number] Date of Birth: 01/21/1966 Referring Provider (PT): Levy Pupa PA-C   Encounter Date: 05/10/2020   PT End of Session - 05/10/20 1631    Visit Number 15    Number of Visits 20    Date for PT Re-Evaluation 05/06/20    Authorization Type [employer] 12 visits approved time ran our requested 12 more on 04/06/2020    Authorization Time Period 12 visits approved 7/9 to 8/23    Authorization - Visit Number 8    Authorization - Number of Visits 12   time period ended, request additional visits   Progress Note Due on Visit 18    PT Start Time 1622    PT Stop Time 1702    PT Time Calculation (min) 40 min    Activity Tolerance Patient limited by pain;No increased pain    Behavior During Therapy WFL for tasks assessed/performed           Past Medical History:  Diagnosis Date   Anemia    Anxiety    Arthritis    oa needs hip replacement on right   Cancer (HCC)    squamous cell areas removed from vulva and pre caner areas removed from leg    Carotid artery occlusion    Chronic diastolic HF (heart failure) (HCC)    pt denies   COPD (chronic obstructive pulmonary disease) ([place])    pt denies   Coronary artery disease    LHC 11/25/12 90% stenosis mid LCx & otherwise nonobstructive dz w/ EF 60-65% S/p PTCA/DES to LCx   Depression    Elevated cholesterol    Exertional dyspnea    chronic   GERD (gastroesophageal reflux disease)    History of cardiovascular stress test 06/2015   low risk   HPV in female    Hyperlipidemia    Hypertension    MI (myocardial infarction) ([place]) fe 17, 2014   Obesity    Polycystic ovary disease    Skin tear of upper arm without complication, left, sequela    small skin tear left upper arm no drainage for 1 week    Tobacco abuse     Past Surgical History:  Procedure Laterality Date   CORONARY ANGIOPLASTY WITH STENT PLACEMENT     LEFT HEART CATH AND CORONARY ANGIOGRAPHY N/A 08/09/2018   Procedure: LEFT HEART CATH AND CORONARY ANGIOGRAPHY;  Surgeon: Nelva Bush, MD;  Location: [facility];  Service: Cardiovascular;  Laterality: N/A;   LEFT HEART CATHETERIZATION WITH CORONARY ANGIOGRAM N/A 11/15/2012   Procedure: LEFT HEART CATHETERIZATION WITH CORONARY ANGIOGRAM;  Surgeon: Thayer Headings, MD;  [facility];  Service: Cardiovascular;  Laterality: N/A;   PERCUTANEOUS CORONARY STENT INTERVENTION (PCI-S)  11/15/2012   Procedure: PERCUTANEOUS CORONARY STENT INTERVENTION (PCI-S);  Surgeon: Thayer Headings, MD;  Location: [facility] CATH LAB;  Service: Cardiovascular;;   skin cancer removed right lower leg Right    TONSILLECTOMY     uterus ablation     VULVECTOMY N/A 11/08/2019   Procedure: excision of VULVA, vulvoscopy;  Surgeon: Dian Queen, MD;  Location: [facility];  Service: Gynecology;  Laterality: N/A;    There were no vitals filed for this visit.   Subjective Assessment - 05/10/20 1629    Subjective Pt stated LBP pain scale  6/10.    Pertinent History blocked carotid artery, chronic LBP, hip pain    Patient Stated Goals get out of some of the pain.    Currently in Pain? Yes    Pain Score 6     Pain Location Back    Pain Orientation Lower    Pain Descriptors / Indicators Tender;Sore;Aching    Pain Onset More than a month ago    Pain Frequency Intermittent    Aggravating Factors  changing positions    Pain Relieving Factors meds    Effect of Pain on Daily Activities limits                             [facility] Adult PT Treatment/Exercise - 05/10/20 0001      Lumbar Exercises: Seated   Other Seated Lumbar Exercises seated on dyno disc while doing 10x rows wiht GTB      Lumbar Exercises: Supine   Bridge 10 reps    Bridge Limitations 2  sets on ball     Other Supine Lumbar Exercises 90/90 hamstring isometrics x20 5" holds       Manual Therapy   Manual Therapy Soft tissue mobilization;Manual Traction    Manual therapy comments Manual treatment performed independant of other activity     Soft tissue mobilization STM to lumbar paraspinals, sacral and gluteal mm    Manual Traction 6 min on green ball with and without rotation 1' holds                    PT Short Term Goals - 05/07/20 1417      PT SHORT TERM GOAL #1   Title Patient will be independent with HEP in order to improve functional outcomes.    Time 3    Period Weeks    Status On-going    Target Date 03/13/20      PT SHORT TERM GOAL #2   Title Patient will report at least 25% improvement in symptoms for improved quality of life.    Time 3    Period Weeks    Status Achieved    Target Date 03/13/20             PT Long Term Goals - 05/07/20 1417      PT LONG TERM GOAL #1   Title Patient will report at least 75% improvement in symptoms for improved quality of life.    Baseline 50% improvement    Time 6    Period Weeks    Status On-going      PT LONG TERM GOAL #2   Title Patient will improve FOTO score by at least 5 points in order to indicate improved tolerance to activity.    Baseline started at 26% function and now at 32% function    Time 6    Period Weeks    Status Achieved      PT LONG TERM GOAL #3   Title Patient will be able to complete 5x STS in under 11.4 seconds in order to reduce the risk of falls.    Time 6    Period Weeks    Status On-going      PT LONG TERM GOAL #4   Title Patient will demonstrate at least 25% improvement in lumbar ROM in all planes for improved ability to move trunk for housework.    Baseline see objective section    Time 6  Period Weeks    Status On-going                 Plan - 05/10/20 1737    Clinical Impression Statement Pt tolerated well towards session.  Added theraband resistance  wiht dynodisc activities for core engangement for postural strengthening.  Continued core and proximal strengthening with visable fatigue.  EOS wiht manual traction and STM to paraspinals and gluteal mm wiht reoprts of relief folloiwng.    Personal Factors and Comorbidities Comorbidity 3+;Fitness;Past/Current Experience;Time since onset of injury/illness/exacerbation;Social Background    Comorbidities anxiety, depression, HTN, carotid artery blockage on L, chronic LBP, chronic hip pain, sedentary lifestyle    Examination-Activity Limitations Bathing;Bed Mobility;Bend;Caring for Others;Carry;Continence;Dressing;Hygiene/Grooming;Lift;Locomotion Level;Sit;Squat;Stairs;Stand;Transfers    Examination-Participation Restrictions Cleaning;Church;Community Activity;Driving;Meal Prep;Shop;Volunteer;Yard Work    [employer] officer Evolving/Moderate complexity    Clinical Decision Making Moderate    Rehab Potential Fair    PT Frequency 2x / week    PT Duration --   12 weeks   PT Treatment/Interventions ADLs/Self Care Home Management;Aquatic Therapy;Biofeedback;Cryotherapy;Electrical Stimulation;Iontophoresis 4mg /ml Dexamethasone;Moist Heat;Traction;Ultrasound;DME Instruction;Gait training;Stair training;Functional mobility training;Therapeutic activities;Therapeutic exercise;Balance training;Neuromuscular re-education;Patient/family education;Orthotic Fit/Training;Manual techniques;Manual lymph drainage;Passive range of motion;Dry needling;Energy conservation;Splinting;Taping;Spinal Manipulations;Joint Manipulations    PT Next Visit Plan seated on dyno disc posture, traction/manual as needed for pain relief. Progresss with lumbar stabilty exercises..  Continue core and proximal strengthening, weight bearing to equalize with gait.  Manual for pain control.  lumbar traction- leg lifting up (% of this ) to aid with showering    PT Home Exercise Plan 02/23/20: ab set; 6/2: LTR and bridges; 03/07/20:  diaphragmatic breathing.;6/28 90/90 supportive position and leg press into wall; 04/06/2020: decomression exercises;           Patient will benefit from skilled therapeutic intervention in order to improve the following deficits and impairments:  Abnormal gait, Decreased activity tolerance, Decreased balance, Decreased mobility, Decreased endurance, Decreased range of motion, Decreased strength, Difficulty walking, Increased muscle spasms, Impaired perceived functional ability, Impaired sensation, Improper body mechanics, Pain  Visit Diagnosis: Other symptoms and signs involving the musculoskeletal system  Low back pain, unspecified back pain laterality, unspecified chronicity, unspecified whether sciatica present  Muscle weakness (generalized)  Other abnormalities of gait and mobility     Problem List Patient Active Problem List   Diagnosis Date Noted   Chronic diastolic HF (heart failure) ([place]) 08/10/2018   GERD (gastroesophageal reflux disease) 08/09/2018   Unstable angina ([place]) 08/09/2018   Carotid artery disease ([place]) 07/24/2017   AAA (abdominal aortic aneurysm) without rupture ([place]) 01/05/2013   Other malaise and fatigue 01/05/2013   Palpitations 01/05/2013   Tobacco use disorder 12/02/2012   Coronary artery disease    Hyperlipidemia    Hypertension    SHORTNESS OF BREATH 09/04/2009   CHEST PAIN 09/04/2009   Ihor Austin, LPTA/CLT; CBIS [phone]  Aldona Lento 05/10/2020, 5:41 PM  [place], [place], [address] Phone: [phone]   Fax:  [phone]  Name: Shannon [name] MRN: [id number] Date of Birth: 1965/10/04

## 2020-05-11 ENCOUNTER — Ambulatory Visit: Payer: Medicare HMO | Admitting: Family Medicine

## 2020-05-11 ENCOUNTER — Ambulatory Visit: Payer: Medicare HMO | Admitting: Cardiovascular Disease

## 2020-05-15 ENCOUNTER — Telehealth (HOSPITAL_COMMUNITY): Payer: Self-pay | Admitting: Physical Therapy

## 2020-05-15 NOTE — Telephone Encounter (Signed)
pt cancelled appt for 8/18 because she has to go to a funeral

## 2020-05-16 ENCOUNTER — Ambulatory Visit (HOSPITAL_COMMUNITY): Payer: Medicare HMO | Admitting: Physical Therapy

## 2020-05-17 ENCOUNTER — Ambulatory Visit (HOSPITAL_COMMUNITY): Payer: Medicare HMO

## 2020-05-17 ENCOUNTER — Encounter (HOSPITAL_COMMUNITY): Payer: Self-pay

## 2020-05-17 DIAGNOSIS — R2689 Other abnormalities of gait and mobility: Secondary | ICD-10-CM | POA: Diagnosis not present

## 2020-05-17 DIAGNOSIS — M6281 Muscle weakness (generalized): Secondary | ICD-10-CM | POA: Diagnosis not present

## 2020-05-17 DIAGNOSIS — M545 Low back pain, unspecified: Secondary | ICD-10-CM

## 2020-05-17 DIAGNOSIS — R29898 Other symptoms and signs involving the musculoskeletal system: Secondary | ICD-10-CM | POA: Diagnosis not present

## 2020-05-17 NOTE — Addendum Note (Signed)
Addended by: Jerene Pitch R on: 05/17/2020 12:06 PM   Modules accepted: Orders

## 2020-05-17 NOTE — Therapy (Signed)
[place], [place], [address] Phone: [phone]   Fax:  [phone]  Physical Therapy Treatment  Patient Details  Name: Shannon [name] MRN: [id number] Date of Birth: Oct 31, 1965 Referring Provider (PT): Levy Pupa PA-C   Encounter Date: 05/17/2020   PT End of Session - 05/17/20 1506    Visit Number 16    Number of Visits 22    Date for PT Re-Evaluation 06/04/20    Authorization Type [employer] 12 visits approved time ran our requested 12 more on 04/06/2020    Authorization Time Period 12 visits approved 7/9 to 8/23    Authorization - Visit Number 9    Authorization - Number of Visits 12   time period ended requested additional visits   Progress Note Due on Visit 24    PT Start Time 1448    PT Stop Time 1538    PT Time Calculation (min) 50 min    Activity Tolerance Patient limited by pain;Patient tolerated treatment well;No increased pain    Behavior During Therapy WFL for tasks assessed/performed           Past Medical History:  Diagnosis Date   Anemia    Anxiety    Arthritis    oa needs hip replacement on right   Cancer (HCC)    squamous cell areas removed from vulva and pre caner areas removed from leg    Carotid artery occlusion    Chronic diastolic HF (heart failure) (HCC)    pt denies   COPD (chronic obstructive pulmonary disease) ([place])    pt denies   Coronary artery disease    LHC 11/25/12 90% stenosis mid LCx & otherwise nonobstructive dz w/ EF 60-65% S/p PTCA/DES to LCx   Depression    Elevated cholesterol    Exertional dyspnea    chronic   GERD (gastroesophageal reflux disease)    History of cardiovascular stress test 06/2015   low risk   HPV in female    Hyperlipidemia    Hypertension    MI (myocardial infarction) ([place]) fe 17, 2014   Obesity    Polycystic ovary disease    Skin tear of upper arm without complication, left, sequela    small skin tear left  upper arm no drainage for 1 week   Tobacco abuse     Past Surgical History:  Procedure Laterality Date   CORONARY ANGIOPLASTY WITH STENT PLACEMENT     LEFT HEART CATH AND CORONARY ANGIOGRAPHY N/A 08/09/2018   Procedure: LEFT HEART CATH AND CORONARY ANGIOGRAPHY;  Surgeon: Nelva Bush, MD;  Location: [facility];  Service: Cardiovascular;  Laterality: N/A;   LEFT HEART CATHETERIZATION WITH CORONARY ANGIOGRAM N/A 11/15/2012   Procedure: LEFT HEART CATHETERIZATION WITH CORONARY ANGIOGRAM;  Surgeon: Thayer Headings, MD;  Location: [facility] CATH LAB;  Service: Cardiovascular;  Laterality: N/A;   PERCUTANEOUS CORONARY STENT INTERVENTION (PCI-S)  11/15/2012   Procedure: PERCUTANEOUS CORONARY STENT INTERVENTION (PCI-S);  Surgeon: Thayer Headings, MD;  Location: [facility] CATH LAB;  Service: Cardiovascular;;   skin cancer removed right lower leg Right    TONSILLECTOMY     uterus ablation     VULVECTOMY N/A 11/08/2019   Procedure: excision of VULVA, vulvoscopy;  Surgeon: Dian Queen, MD;  Location: [facility];  Service: Gynecology;  Laterality: N/A;    There were no vitals filed for this visit.   Subjective Assessment - 05/17/20 1451    Subjective Pt stated  LBP pain scale 5-6/10.  Reports ability to do more home activities but able to tolerate with intermittent LBP.  Pt reports she would like to work on mobility to enable ability to shave legs in bathtub.    Pertinent History blocked carotid artery, chronic LBP, hip pain    Patient Stated Goals get out of some of the pain.    Currently in Pain? Yes    Pain Score 6     Pain Location Back    Pain Orientation Lower    Pain Type Chronic pain    Pain Onset More than a month ago    Pain Frequency Intermittent    Aggravating Factors  activity    Pain Relieving Factors meds, rest    Effect of Pain on Daily Activities limits                             [facility] Adult PT Treatment/Exercise - 05/17/20 1510       Transfers   Five time sit to stand comments  --    Comments bathtub mobility; encouraged to get shower chair and handrails for safety.  DIscussed proping foot of side of bathtub to reduce forward bending      Lumbar Exercises: Stretches   Lower Trunk Rotation Limitations 10x 10" feet on green ball      Lumbar Exercises: Supine   Bridge 5 reps    Bridge Limitations feet on ball    Other Supine Lumbar Exercises 90/90 hamstring isometrics x20 5" holds     Other Supine Lumbar Exercises feet on ball forward and rotation for core strengthening      Manual Therapy   Manual Therapy Soft tissue mobilization;Manual Traction    Manual therapy comments Manual treatment performed independant of other activity     Soft tissue mobilization STM to lumbar paraspinals, sacral and gluteal mm    Manual Traction 8 min on green ball with and without rotation 1' holds                    PT Short Term Goals - 05/07/20 1417      PT SHORT TERM GOAL #1   Title Patient will be independent with HEP in order to improve functional outcomes.    Time 3    Period Weeks    Status On-going    Target Date 03/13/20      PT SHORT TERM GOAL #2   Title Patient will report at least 25% improvement in symptoms for improved quality of life.    Time 3    Period Weeks    Status Achieved    Target Date 03/13/20             PT Long Term Goals - 05/07/20 1417      PT LONG TERM GOAL #1   Title Patient will report at least 75% improvement in symptoms for improved quality of life.    Baseline 50% improvement    Time 6    Period Weeks    Status On-going      PT LONG TERM GOAL #2   Title Patient will improve FOTO score by at least 5 points in order to indicate improved tolerance to activity.    Baseline started at 26% function and now at 32% function    Time 6    Period Weeks    Status Achieved      PT LONG TERM GOAL #  3   Title Patient will be able to complete 5x STS in under 11.4 seconds in order  to reduce the risk of falls.    Time 6    Period Weeks    Status On-going      PT LONG TERM GOAL #4   Title Patient will demonstrate at least 25% improvement in lumbar ROM in all planes for improved ability to move trunk for housework.    Baseline see objective section    Time 6    Period Weeks    Status On-going                 Plan - 05/17/20 1552    Clinical Impression Statement Session focus on shower mobility and progressed core/proximal strengthening.  Suggested additional shower chair, handrails and anti-skid pads on bottom of shower for safety and to assist with pain control from forward bending when washing feet and shaving legs.  Pt able to demonstrate appropriate mechanics and verbalized understanding.  EOS with manual traction and STM to paraspinals and gluteal mm with reports of relief following.    Personal Factors and Comorbidities Comorbidity 3+;Fitness;Past/Current Experience;Time since onset of injury/illness/exacerbation;Social Background    Comorbidities anxiety, depression, HTN, carotid artery blockage on L, chronic LBP, chronic hip pain, sedentary lifestyle    Examination-Activity Limitations Bathing;Bed Mobility;Bend;Caring for Others;Carry;Continence;Dressing;Hygiene/Grooming;Lift;Locomotion Level;Sit;Squat;Stairs;Stand;Transfers    Examination-Participation Restrictions Cleaning;Church;Community Activity;Driving;Meal Prep;Shop;Volunteer;Yard Work    [employer]chant [employer] officer Evolving/Moderate complexity    Clinical Decision Making Moderate    Rehab Potential Fair    PT Frequency 2x / week    PT Duration 12 weeks    PT Treatment/Interventions ADLs/Self Care Home Management;Aquatic Therapy;Biofeedback;Cryotherapy;Electrical Stimulation;Iontophoresis 4mg /ml Dexamethasone;Moist Heat;Traction;Ultrasound;DME Instruction;Gait training;Stair training;Functional mobility training;Therapeutic activities;Therapeutic exercise;Balance training;Neuromuscular  re-education;Patient/family education;Orthotic Fit/Training;Manual techniques;Manual lymph drainage;Passive range of motion;Dry needling;Energy conservation;Splinting;Taping;Spinal Manipulations;Joint Manipulations    PT Next Visit Plan Reassess next session.    PT Home Exercise Plan 02/23/20: ab set; 6/2: LTR and bridges; 03/07/20: diaphragmatic breathing.;6/28 90/90 supportive position and leg press into wall; 04/06/2020: decomression exercises;           Patient will benefit from skilled therapeutic intervention in order to improve the following deficits and impairments:  Abnormal gait, Decreased activity tolerance, Decreased balance, Decreased mobility, Decreased endurance, Decreased range of motion, Decreased strength, Difficulty walking, Increased muscle spasms, Impaired perceived functional ability, Impaired sensation, Improper body mechanics, Pain  Visit Diagnosis: Low back pain, unspecified back pain laterality, unspecified chronicity, unspecified whether sciatica present  Muscle weakness (generalized)  Other abnormalities of gait and mobility  Other symptoms and signs involving the musculoskeletal system     Problem List Patient Active Problem List   Diagnosis Date Noted   Chronic diastolic HF (heart failure) ([place]) 08/10/2018   GERD (gastroesophageal reflux disease) 08/09/2018   Unstable angina ([place]) 08/09/2018   Carotid artery disease ([place]) 07/24/2017   AAA (abdominal aortic aneurysm) without rupture ([place]) 01/05/2013   Other malaise and fatigue 01/05/2013   Palpitations 01/05/2013   Tobacco use disorder 12/02/2012   Coronary artery disease    Hyperlipidemia    Hypertension    SHORTNESS OF BREATH 09/04/2009   CHEST PAIN 09/04/2009   Ihor Austin, LPTA/CLT; CBIS [phone]  Aldona Lento 05/17/2020, 3:59 PM  [facility] [address] [place], [place], [address] Phone: [phone]   Fax:   [phone]  Name: ELEXIS POLLAK MRN: [id number] Date of Birth: 1966/08/27

## 2020-05-21 ENCOUNTER — Telehealth (HOSPITAL_COMMUNITY): Payer: Self-pay | Admitting: Physical Therapy

## 2020-05-21 ENCOUNTER — Ambulatory Visit (HOSPITAL_COMMUNITY): Payer: Self-pay | Admitting: Physical Therapy

## 2020-05-21 DIAGNOSIS — M1612 Unilateral primary osteoarthritis, left hip: Secondary | ICD-10-CM | POA: Diagnosis not present

## 2020-05-21 DIAGNOSIS — M160 Bilateral primary osteoarthritis of hip: Secondary | ICD-10-CM | POA: Diagnosis not present

## 2020-05-21 DIAGNOSIS — M25551 Pain in right hip: Secondary | ICD-10-CM | POA: Diagnosis not present

## 2020-05-21 NOTE — Telephone Encounter (Signed)
pt cancelled appts for today because she is double booked

## 2020-05-28 ENCOUNTER — Ambulatory Visit (HOSPITAL_COMMUNITY): Payer: Medicare HMO | Admitting: Physical Therapy

## 2020-05-28 ENCOUNTER — Telehealth (HOSPITAL_COMMUNITY): Payer: Self-pay | Admitting: Physical Therapy

## 2020-05-28 NOTE — Telephone Encounter (Signed)
She is dizzy and has a headache she wants to cx today and be here tomorrow

## 2020-05-29 ENCOUNTER — Ambulatory Visit (HOSPITAL_COMMUNITY): Payer: Medicare HMO | Admitting: Physical Therapy

## 2020-05-29 ENCOUNTER — Encounter (HOSPITAL_COMMUNITY): Payer: Self-pay | Admitting: Physical Therapy

## 2020-05-29 ENCOUNTER — Other Ambulatory Visit: Payer: Self-pay

## 2020-05-29 DIAGNOSIS — R29898 Other symptoms and signs involving the musculoskeletal system: Secondary | ICD-10-CM | POA: Diagnosis not present

## 2020-05-29 DIAGNOSIS — M6281 Muscle weakness (generalized): Secondary | ICD-10-CM | POA: Diagnosis not present

## 2020-05-29 DIAGNOSIS — R2689 Other abnormalities of gait and mobility: Secondary | ICD-10-CM | POA: Diagnosis not present

## 2020-05-29 DIAGNOSIS — M545 Low back pain, unspecified: Secondary | ICD-10-CM

## 2020-05-29 NOTE — Therapy (Signed)
[place], [place], [address] Phone: [phone]   Fax:  [phone]  Physical Therapy Treatment and Recert  Patient Details  Name: Shannon [name] MRN: [id number] Date of Birth: 09-Feb-1966 Referring Provider (PT): Levy Pupa PA-C   Encounter Date: 05/29/2020   PT End of Session - 05/29/20 1621    Visit Number 17    Number of Visits 22    Date for PT Re-Evaluation 06/04/20    Authorization Type [employer] - additional visits requested (would include today's visit)    Authorization Time Period --    Authorization - Visit Number 10    Authorization - Number of Visits 12   time period ended requested additional visits   Progress Note Due on Visit 24    PT Start Time 1616    PT Stop Time 1655    PT Time Calculation (min) 39 min    Activity Tolerance Patient limited by pain;Patient tolerated treatment well;No increased pain    Behavior During Therapy WFL for tasks assessed/performed           Past Medical History:  Diagnosis Date  . Anemia   . Anxiety   . Arthritis    oa needs hip replacement on right  . Cancer ([place])    squamous cell areas removed from vulva and pre caner areas removed from leg   . Carotid artery occlusion   . Chronic diastolic HF (heart failure) ([place])    pt denies  . COPD (chronic obstructive pulmonary disease) ([place])    pt denies  . Coronary artery disease    LHC 11/25/12 90% stenosis mid LCx & otherwise nonobstructive dz w/ EF 60-65% S/p PTCA/DES to LCx  . Depression   . Elevated cholesterol   . Exertional dyspnea    chronic  . GERD (gastroesophageal reflux disease)   . History of cardiovascular stress test 06/2015   low risk  . HPV in female   . Hyperlipidemia   . Hypertension   . MI (myocardial infarction) ([place]) fe 17, 2014  . Obesity   . Polycystic ovary disease   . Skin tear of upper arm without complication, left, sequela    small skin tear left upper arm no drainage  for 1 week  . Tobacco abuse     Past Surgical History:  Procedure Laterality Date  . CORONARY ANGIOPLASTY WITH STENT PLACEMENT    . LEFT HEART CATH AND CORONARY ANGIOGRAPHY N/A 08/09/2018   Procedure: LEFT HEART CATH AND CORONARY ANGIOGRAPHY;  Surgeon: Nelva Bush, MD;  Location: [facility];  Service: Cardiovascular;  Laterality: N/A;  . LEFT HEART CATHETERIZATION WITH CORONARY ANGIOGRAM N/A 11/15/2012   Procedure: LEFT HEART CATHETERIZATION WITH CORONARY ANGIOGRAM;  Surgeon: Thayer Headings, MD;  Location: [facility] CATH LAB;  Service: Cardiovascular;  Laterality: N/A;  . PERCUTANEOUS CORONARY STENT INTERVENTION (PCI-S)  11/15/2012   Procedure: PERCUTANEOUS CORONARY STENT INTERVENTION (PCI-S);  Surgeon: Thayer Headings, MD;  Location: [facility] CATH LAB;  Service: Cardiovascular;;  . skin cancer removed right lower leg Right   . TONSILLECTOMY    . uterus ablation    . VULVECTOMY N/A 11/08/2019   Procedure: excision of VULVA, vulvoscopy;  Surgeon: Dian Queen, MD;  Location: [facility];  Service: Gynecology;  Laterality: N/A;    There were no vitals filed for this visit.   Subjective Assessment - 05/29/20 1656    Subjective Reports she is about 30-40% better. States she  has not been feeling well and not been in to therapy. States that she is scheduled to get an injection in her hip tomorrow. Reports that she feels like she has improved in so much since PT and is not as guards. States that prior to therapy she would be in tears with getting her clothes on and lifting up her leg, now it is still difficult but not as painful. States she would just like to be able to mop and sweep without severe fatigue and pain afterwards. She has no one to assist her with taking care of herself and needs to be able to stay as independent as possible.    Pertinent History blocked carotid artery, chronic LBP, hip pain    Patient Stated Goals get out of some of the pain.    Currently in Pain? Yes     Pain Score 6     Pain Location Back    Pain Orientation Lower    Pain Descriptors / Indicators Aching;Tender;Sore    Pain Onset More than a month ago    Pain Frequency Constant    Aggravating Factors  activity              [facility] PT Assessment - 05/29/20 0001      Assessment   Medical Diagnosis Spondylosis, Lumbar region    Referring Provider (PT) Levy Pupa PA-C      Observation/Other Assessments   Focus on Therapeutic Outcomes (FOTO)  36% function       AROM   Lumbar Flexion 50% limited   pain in hips and back    Lumbar Extension 100% limited   pain in back and hips    Lumbar - Right Side Bend 50% limited pain in back     Lumbar - Left Side Bend 75% limited pain in back       Strength   Right Hip Flexion 4+/5   no pain    Left Hip Flexion 4+/5   no pain    Right Knee Flexion 4+/5   pain in  hip    Right Knee Extension 5/5    Left Knee Flexion 4+/5    Left Knee Extension 5/5    Right Ankle Dorsiflexion 4+/5    Left Ankle Dorsiflexion 4+/5      Transfers   Five time sit to stand comments  19.8 seconds, uses arm on thighs, favors left leg    was 21.9 seconds                                 PT Education - 05/29/20 1704    Education Details on current presentation, on FOTO score, on progress made and POC/plan moving forward. Answered all questions and concerns at this time.    Person(s) Educated Patient    Methods Explanation    Comprehension Verbalized understanding            PT Short Term Goals - 05/07/20 1417      PT SHORT TERM GOAL #1   Title Patient will be independent with HEP in order to improve functional outcomes.    Time 3    Period Weeks    Status On-going    Target Date 03/13/20      PT SHORT TERM GOAL #2   Title Patient will report at least 25% improvement in symptoms for improved quality of life.    Time 3    Period  Weeks    Status Achieved    Target Date 03/13/20             PT Long Term Goals -  05/29/20 1636      PT LONG TERM GOAL #1   Title Patient will report at least 75% improvement in symptoms for improved quality of life.    Baseline 40-50% better    Time 6    Period Weeks    Status On-going      PT LONG TERM GOAL #2   Title Patient will improve FOTO score by at least 5 points in order to indicate improved tolerance to activity.    Baseline started at 26% function and now at 36%    Time 6    Period Weeks    Status Achieved      PT LONG TERM GOAL #3   Title Patient will be able to complete 5x STS in under 11.4 seconds in order to reduce the risk of falls.    Time 6    Period Weeks    Status On-going      PT LONG TERM GOAL #4   Title Patient will demonstrate at least 25% improvement in lumbar ROM in all planes for improved ability to move trunk for housework.    Baseline see objective section    Time 6    Period Weeks    Status On-going      PT LONG TERM GOAL #5   Title Patient will be able to step into bathtub/shower combination and be able to pick up her feet with ease to wash them to demonstrate improved functional tolerance.    Time 4    Period Weeks    Status New    Target Date 06/26/20      Additional Long Term Goals   Additional Long Term Goals Yes      PT LONG TERM GOAL #6   Title Patient will report being able to sweep or mop without severe exhaustion afterwards to demonstrate improved functional endurance.    Time 4    Period Weeks    Status New    Target Date 06/26/20                 Plan - 05/29/20 1658    Clinical Impression Statement Patient present for mini reassessment on this date. She has made progress in FOTO score with improvement from 26% function to 36% function. She has improved in subjective report in lifting her legs and dressing, though it is still painful and challenging she is not in as much pain as she was before since starting therapy. Lumbar ROM is still very limited at this time but patient is not as guarded as she  was and is able to tolerated more challenging exercises and movements compared to when she first started coming to therapy. Overall patient would continue to benefit from skilled physical therapy to focus on improving functional mobility and maintaining independence in the home. Added additional goal to POC to focus on home function.    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    PT Frequency 2x / week  PT Duration 12 weeks    PT Treatment/Interventions ADLs/Self Care Home Management;Aquatic Therapy;Biofeedback;Cryotherapy;Electrical Stimulation;Iontophoresis 4mg /ml Dexamethasone;Moist Heat;Traction;Ultrasound;DME Instruction;Gait training;Stair training;Functional mobility training;Therapeutic activities;Therapeutic exercise;Balance training;Neuromuscular re-education;Patient/family education;Orthotic Fit/Training;Manual techniques;Manual lymph drainage;Passive range of motion;Dry needling;Energy conservation;Splinting;Taping;Spinal Manipulations;Joint Manipulations    PT Next Visit Plan focus on lumbar ROM, functional ROM and HEP.    PT Home Exercise Plan 02/23/20: ab set; 6/2: LTR and bridges; 03/07/20: diaphragmatic breathing.;6/28 90/90 supportive position and leg press into wall; 04/06/2020: decomression exercises;           Patient will benefit from skilled therapeutic intervention in  order to improve the following deficits and impairments:  Abnormal gait, Decreased activity tolerance, Decreased balance, Decreased mobility, Decreased endurance, Decreased range of motion, Decreased strength, Difficulty walking, Increased muscle spasms, Impaired perceived functional ability, Impaired sensation, Improper body mechanics, Pain  Visit Diagnosis: Low back pain, unspecified back pain laterality, unspecified chronicity, unspecified whether sciatica present  Muscle weakness (generalized)     Problem List Patient Active Problem List   Diagnosis Date Noted  . Chronic diastolic HF (heart failure) ([place]) 08/10/2018  . GERD (gastroesophageal reflux disease) 08/09/2018  . Unstable angina ([place]) 08/09/2018  . Carotid artery disease ([place]) 07/24/2017  . AAA (abdominal aortic aneurysm) without rupture ([place]) 01/05/2013  . Other malaise and fatigue 01/05/2013  . Palpitations 01/05/2013  . Tobacco use disorder 12/02/2012  . Coronary artery disease   . Hyperlipidemia   . Hypertension   . SHORTNESS OF BREATH 09/04/2009  . CHEST PAIN 09/04/2009    5:05 PM, 05/29/20 Jerene Pitch, DPT Physical Therapy with [facility]  [phone] office  [place] [address] [place], [place], [address] Phone: [phone]   Fax:  [phone]  Name: CHARLOTT CALVARIO MRN: [id number] Date of Birth: [birth year]

## 2020-05-30 ENCOUNTER — Ambulatory Visit (HOSPITAL_COMMUNITY): Payer: Medicare HMO | Attending: Chiropractic Medicine | Admitting: Physical Therapy

## 2020-05-30 ENCOUNTER — Encounter (HOSPITAL_COMMUNITY): Payer: Self-pay | Admitting: Physical Therapy

## 2020-05-30 ENCOUNTER — Other Ambulatory Visit: Payer: Self-pay

## 2020-05-30 DIAGNOSIS — R2689 Other abnormalities of gait and mobility: Secondary | ICD-10-CM | POA: Insufficient documentation

## 2020-05-30 DIAGNOSIS — R29898 Other symptoms and signs involving the musculoskeletal system: Secondary | ICD-10-CM | POA: Insufficient documentation

## 2020-05-30 DIAGNOSIS — M6281 Muscle weakness (generalized): Secondary | ICD-10-CM | POA: Insufficient documentation

## 2020-05-30 DIAGNOSIS — M160 Bilateral primary osteoarthritis of hip: Secondary | ICD-10-CM | POA: Diagnosis not present

## 2020-05-30 DIAGNOSIS — M545 Low back pain, unspecified: Secondary | ICD-10-CM

## 2020-05-30 DIAGNOSIS — M1611 Unilateral primary osteoarthritis, right hip: Secondary | ICD-10-CM | POA: Diagnosis not present

## 2020-05-30 NOTE — Therapy (Signed)
[place], [place], [address] Phone: [phone]   Fax:  [phone]  Physical Therapy Treatment  Patient Details  Name: Shannon [name] MRN: [id number] Date of Birth: 1965/12/09 Referring Provider (PT): Levy Pupa PA-C   Encounter Date: 05/30/2020   PT End of Session - 05/30/20 1446    Visit Number 18    Number of Visits 22    Date for PT Re-Evaluation 06/04/20    Authorization Type [employer] - additional visits requested (would include today's visit)    Authorization - Number of Visits --   time period ended requested additional visits   Progress Note Due on Visit 24    PT Start Time 1445    PT Stop Time 1533    PT Time Calculation (min) 48 min    Activity Tolerance Patient limited by pain;Patient tolerated treatment well;No increased pain    Behavior During Therapy WFL for tasks assessed/performed           Past Medical History:  Diagnosis Date  . Anemia   . Anxiety   . Arthritis    oa needs hip replacement on right  . Cancer ([place])    squamous cell areas removed from vulva and pre caner areas removed from leg   . Carotid artery occlusion   . Chronic diastolic HF (heart failure) ([place])    pt denies  . COPD (chronic obstructive pulmonary disease) ([place])    pt denies  . Coronary artery disease    LHC 11/25/12 90% stenosis mid LCx & otherwise nonobstructive dz w/ EF 60-65% S/p PTCA/DES to LCx  . Depression   . Elevated cholesterol   . Exertional dyspnea    chronic  . GERD (gastroesophageal reflux disease)   . History of cardiovascular stress test 06/2015   low risk  . HPV in female   . Hyperlipidemia   . Hypertension   . MI (myocardial infarction) ([place]) fe 17, 2014  . Obesity   . Polycystic ovary disease   . Skin tear of upper arm without complication, left, sequela    small skin tear left upper arm no drainage for 1 week  . Tobacco abuse     Past Surgical History:  Procedure  Laterality Date  . CORONARY ANGIOPLASTY WITH STENT PLACEMENT    . LEFT HEART CATH AND CORONARY ANGIOGRAPHY N/A 08/09/2018   Procedure: LEFT HEART CATH AND CORONARY ANGIOGRAPHY;  Surgeon: Nelva Bush, MD;  Location: [facility];  Service: Cardiovascular;  Laterality: N/A;  . LEFT HEART CATHETERIZATION WITH CORONARY ANGIOGRAM N/A 11/15/2012   Procedure: LEFT HEART CATHETERIZATION WITH CORONARY ANGIOGRAM;  Surgeon: Thayer Headings, MD;  Location: [facility] CATH LAB;  Service: Cardiovascular;  Laterality: N/A;  . PERCUTANEOUS CORONARY STENT INTERVENTION (PCI-S)  11/15/2012   Procedure: PERCUTANEOUS CORONARY STENT INTERVENTION (PCI-S);  Surgeon: Thayer Headings, MD;  Location: [facility] CATH LAB;  Service: Cardiovascular;;  . skin cancer removed right lower leg Right   . TONSILLECTOMY    . uterus ablation    . VULVECTOMY N/A 11/08/2019   Procedure: excision of VULVA, vulvoscopy;  Surgeon: Dian Queen, MD;  Location: [facility];  Service: Gynecology;  Laterality: N/A;    There were no vitals filed for this visit.   Subjective Assessment - 05/30/20 1500    Subjective States she got her injection in the hip today and is a bit sore. States that she has been really upset as she doesn't know  if anything will help. States that her MD mentioned something about following up with MD that specializes in neuro.    Pertinent History blocked carotid artery, chronic LBP, hip pain    Patient Stated Goals get out of some of the pain.    Pain Onset More than a month ago              [facility] PT Assessment - 05/30/20 0001      Assessment   Medical Diagnosis Spondylosis, Lumbar region    Referring Provider (PT) Levy Pupa PA-C                         [facility] Adult PT Treatment/Exercise - 05/30/20 0001      Lumbar Exercises: Prone   Straight Leg Raise 5 reps;5 seconds   3 sets B - gradual motion   Other Prone Lumbar Exercises pelvic rocking with legs crossed 4 minutes  continuous      Lumbar Exercises: Quadruped   Straight Leg Raise --    Other Quadruped Lumbar Exercises hamstring curl 3x6 B 5" holds slow lower    Other Quadruped Lumbar Exercises rocking back x15, pelvic tilts modified 2x10                   PT Education - 05/30/20 1602    Education Details Discussed patient's concerns about possible neurologic issue contributing to current chronic diffuse pain. Encouraged patient to follow up with MD about referral to neurologist to address concerns.    Person(s) Educated Patient    Methods Explanation    Comprehension Verbalized understanding            PT Short Term Goals - 05/07/20 1417      PT SHORT TERM GOAL #1   Title Patient will be independent with HEP in order to improve functional outcomes.    Time 3    Period Weeks    Status On-going    Target Date 03/13/20      PT SHORT TERM GOAL #2   Title Patient will report at least 25% improvement in symptoms for improved quality of life.    Time 3    Period Weeks    Status Achieved    Target Date 03/13/20             PT Long Term Goals - 05/29/20 1636      PT LONG TERM GOAL #1   Title Patient will report at least 75% improvement in symptoms for improved quality of life.    Baseline 40-50% better    Time 6    Period Weeks    Status On-going      PT LONG TERM GOAL #2   Title Patient will improve FOTO score by at least 5 points in order to indicate improved tolerance to activity.    Baseline started at 26% function and now at 36%    Time 6    Period Weeks    Status Achieved      PT LONG TERM GOAL #3   Title Patient will be able to complete 5x STS in under 11.4 seconds in order to reduce the risk of falls.    Time 6    Period Weeks    Status On-going      PT LONG TERM GOAL #4   Title Patient will demonstrate at least 25% improvement in lumbar ROM in all planes for improved ability to move trunk for housework.  Baseline see objective section    Time 6     Period Weeks    Status On-going      PT LONG TERM GOAL #5   Title Patient will be able to step into bathtub/shower combination and be able to pick up her feet with ease to wash them to demonstrate improved functional tolerance.    Time 4    Period Weeks    Status New    Target Date 06/26/20      Additional Long Term Goals   Additional Long Term Goals Yes      PT LONG TERM GOAL #6   Title Patient will report being able to sweep or mop without severe exhaustion afterwards to demonstrate improved functional endurance.    Time 4    Period Weeks    Status New    Target Date 06/26/20                 Plan - 05/30/20 1601    Clinical Impression Statement Added hip extension on this date. Initial movements very guarded with limited movement noted. With each set available ROM improved with the exercises. Positive enforcement utilized throughout session, improved tolerance with exercises noted throughout session. Fatigue note end of session but improved mobility and "popping" in back. Will continue progressing exercises as tolerated.    Personal Factors and Comorbidities Comorbidity 3+;Fitness;Past/Current Experience;Time since onset of injury/illness/exacerbation;Social Background    Comorbidities anxiety, depression, HTN, carotid artery blockage on L, chronic LBP, chronic hip pain, sedentary lifestyle    Examination-Activity Limitations Bathing;Bed Mobility;Bend;Caring for Others;Carry;Continence;Dressing;Hygiene/Grooming;Lift;Locomotion Level;Sit;Squat;Stairs;Stand;Transfers    Examination-Participation Restrictions Cleaning;Church;Community Activity;Driving;Meal Prep;Shop;Volunteer;Yard Work    Merchant navy officer Evolving/Moderate complexity    Rehab Potential Fair    PT Frequency 2x / week    PT Duration 12 weeks    PT Treatment/Interventions ADLs/Self Care Home Management;Aquatic Therapy;Biofeedback;Cryotherapy;Electrical Stimulation;Iontophoresis 4mg /ml  Dexamethasone;Moist Heat;Traction;Ultrasound;DME Instruction;Gait training;Stair training;Functional mobility training;Therapeutic activities;Therapeutic exercise;Balance training;Neuromuscular re-education;Patient/family education;Orthotic Fit/Training;Manual techniques;Manual lymph drainage;Passive range of motion;Dry needling;Energy conservation;Splinting;Taping;Spinal Manipulations;Joint Manipulations    PT Next Visit Plan focus on lumbar ROM, functional ROM and HEP.    PT Home Exercise Plan 02/23/20: ab set; 6/2: LTR and bridges; 03/07/20: diaphragmatic breathing.;6/28 90/90 supportive position and leg press into wall; 04/06/2020: decomression exercises;           Patient will benefit from skilled therapeutic intervention in order to improve the following deficits and impairments:  Abnormal gait, Decreased activity tolerance, Decreased balance, Decreased mobility, Decreased endurance, Decreased range of motion, Decreased strength, Difficulty walking, Increased muscle spasms, Impaired perceived functional ability, Impaired sensation, Improper body mechanics, Pain  Visit Diagnosis: Low back pain, unspecified back pain laterality, unspecified chronicity, unspecified whether sciatica present  Muscle weakness (generalized)     Problem List Patient Active Problem List   Diagnosis Date Noted  . Chronic diastolic HF (heart failure) ([place]) 08/10/2018  . GERD (gastroesophageal reflux disease) 08/09/2018  . Unstable angina ([place]) 08/09/2018  . Carotid artery disease ([place]) 07/24/2017  . AAA (abdominal aortic aneurysm) without rupture ([place]) 01/05/2013  . Other malaise and fatigue 01/05/2013  . Palpitations 01/05/2013  . Tobacco use disorder 12/02/2012  . Coronary artery disease   . Hyperlipidemia   . Hypertension   . SHORTNESS OF BREATH 09/04/2009  . CHEST PAIN 09/04/2009   4:02 PM, 05/30/20 Jerene Pitch, DPT Physical Therapy with [facility]  [phone] office  [place] [address] [place], [place], [address] Phone: [phone]  Fax:  [phone]  Name: Shannon [name] MRN: [id number] Date of Birth: November 13, 1965

## 2020-06-05 ENCOUNTER — Encounter (HOSPITAL_COMMUNITY): Payer: Medicare HMO | Admitting: Physical Therapy

## 2020-06-07 ENCOUNTER — Encounter (HOSPITAL_COMMUNITY): Payer: Self-pay

## 2020-06-07 ENCOUNTER — Other Ambulatory Visit: Payer: Self-pay

## 2020-06-07 ENCOUNTER — Ambulatory Visit (HOSPITAL_COMMUNITY): Payer: Medicare HMO

## 2020-06-07 DIAGNOSIS — M545 Low back pain, unspecified: Secondary | ICD-10-CM

## 2020-06-07 DIAGNOSIS — R2689 Other abnormalities of gait and mobility: Secondary | ICD-10-CM

## 2020-06-07 DIAGNOSIS — R29898 Other symptoms and signs involving the musculoskeletal system: Secondary | ICD-10-CM

## 2020-06-07 DIAGNOSIS — M6281 Muscle weakness (generalized): Secondary | ICD-10-CM | POA: Diagnosis not present

## 2020-06-07 NOTE — Therapy (Addendum)
[facility] [address] [place], [place], [address] Phone: [phone]   Fax:  [phone]  Physical Therapy Treatment and Discharge Note  Patient Details  Name: Shannon [name] MRN: [id number] Date of Birth: May 17, 1966 Referring Provider (PT): Levy Pupa PA-C  PHYSICAL THERAPY DISCHARGE SUMMARY  Visits from Start of Care:19  Current functional level related to goals / functional outcomes: Unable secondary to unplanned discharge   Remaining deficits: Unable secondary to unplanned discharge   Education / Equipment: Unable secondary to unplanned discharge Plan: Patient agrees to discharge.  Patient goals were not met. Patient is being discharged due to not returning since the last visit.  ?????     4:21 PM, 07/06/20 Jerene Pitch, DPT Physical Therapy with [facility]  [phone] office     Encounter Date: 06/07/2020   PT End of Session - 06/07/20 1456    Visit Number 19    Number of Visits 22    Date for PT Re-Evaluation 06/28/20    Authorization Type [employer] - 6 visits approved 8/31-->06/28/20    Authorization Time Period 6 visits approved 8/31-->06/28/20    Authorization - Visit Number 3    Authorization - Number of Visits 6    Progress Note Due on Visit 24    PT Start Time 1450    PT Stop Time 1530    PT Time Calculation (min) 40 min    Activity Tolerance Patient limited by pain;Patient tolerated treatment well;No increased pain    Behavior During Therapy WFL for tasks assessed/performed           Past Medical History:  Diagnosis Date  . Anemia   . Anxiety   . Arthritis    oa needs hip replacement on right  . 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 Carotid artery occlusion   . Chronic diastolic HF (heart failure) ([place])    pt denies  . COPD (chronic obstructive pulmonary disease) ([place])    pt denies  . Coronary artery  disease    LHC 11/25/12 90% stenosis mid LCx & otherwise nonobstructive dz w/ EF 60-65% S/p PTCA/DES to LCx  . Depression   . Elevated cholesterol   . Exertional dyspnea    chronic  . GERD (gastroesophageal reflux disease)   . History of cardiovascular stress test 06/2015   low risk  . HPV in female   . Hyperlipidemia   . Hypertension   . MI (myocardial infarction) ([place]) fe 17, 2014  . Obesity   . Polycystic ovary disease   . Skin tear of upper arm without complication, left, sequela    small skin tear left upper arm no drainage for 1 week  . Tobacco abuse     Past Surgical History:  Procedure Laterality Date  . CORONARY ANGIOPLASTY WITH STENT PLACEMENT    . LEFT HEART CATH AND CORONARY ANGIOGRAPHY N/A 08/09/2018   Procedure: LEFT HEART CATH AND CORONARY ANGIOGRAPHY;  Surgeon: Nelva Bush, MD;  Location: [facility];  Service: Cardiovascular;  Laterality: N/A;  . LEFT HEART CATHETERIZATION WITH CORONARY ANGIOGRAM N/A 11/15/2012   Procedure: LEFT HEART CATHETERIZATION WITH CORONARY ANGIOGRAM;  Surgeon: Thayer Headings, MD;  Location: [facility] CATH LAB;  Service: Cardiovascular;  Laterality: N/A;  . PERCUTANEOUS CORONARY STENT INTERVENTION (PCI-S)  11/15/2012   Procedure: PERCUTANEOUS CORONARY STENT INTERVENTION (PCI-S);  Surgeon: Thayer Headings, MD;  Location:  [facility] CATH LAB;  Service: Cardiovascular;;  . skin cancer removed right lower leg Right   . TONSILLECTOMY    . uterus ablation    . VULVECTOMY N/A 11/08/2019   Procedure: excision of VULVA, vulvoscopy;  Surgeon: Marcelle Overlie, MD;  Location: [facility];  Service: Gynecology;  Laterality: N/A;    There were no vitals filed for this visit.   Subjective Assessment - 06/07/20 1453    Subjective Pt stated she feels her upper half and lower half don't work together.  Reports she had a couple night of rough night sleep so has been trying to rest.  Current pain scale 5-6/10 sore stiff achey pain in middle of  lower back.    Pertinent History blocked carotid artery, chronic LBP, hip pain    Patient Stated Goals get out of some of the pain.    Currently in Pain? Yes    Pain Score 6     Pain Location Back    Pain Orientation Lower    Pain Descriptors / Indicators Aching;Tightness;Sore    Pain Type Chronic pain    Pain Onset More than a month ago    Pain Frequency Constant    Aggravating Factors  activity    Pain Relieving Factors meds, rest    Effect of Pain on Daily Activities limits                             [facility] Adult PT Treatment/Exercise - 06/07/20 0001      Lumbar Exercises: Stretches   Other Lumbar Stretch Exercise POE x 2 min      Lumbar Exercises: Quadruped   Madcat/Old Horse 5 reps    Madcat/Old Horse Limitations 3-4 deep breaths    Straight Leg Raise 5 reps;3 seconds   2 sets, limited range   Straight Leg Raises Limitations with pelvic tilt, verbal and tactile cueing    Other Quadruped Lumbar Exercises rocking back x15, pelvic tilts modified 2x10       Manual Therapy   Manual Therapy Soft tissue mobilization    Manual therapy comments Manual treatment performed independant of other activity     Soft tissue mobilization STM to Lt glut in prone and glut med in sidelying with PROM                     PT Short Term Goals - 05/07/20 1417      PT SHORT TERM GOAL #1   Title Patient will be independent with HEP in order to improve functional outcomes.    Time 3    Period Weeks    Status On-going    Target Date 03/13/20      PT SHORT TERM GOAL #2   Title Patient will report at least 25% improvement in symptoms for improved quality of life.    Time 3    Period Weeks    Status Achieved    Target Date 03/13/20             PT Long Term Goals - 05/29/20 1636      PT LONG TERM GOAL #1   Title Patient will report at least 75% improvement in symptoms for improved quality of life.    Baseline 40-50% better    Time 6    Period Weeks     Status On-going      PT LONG TERM GOAL #2   Title Patient will  improve FOTO score by at least 5 points in order to indicate improved tolerance to activity.    Baseline started at 26% function and now at 36%    Time 6    Period Weeks    Status Achieved      PT LONG TERM GOAL #3   Title Patient will be able to complete 5x STS in under 11.4 seconds in order to reduce the risk of falls.    Time 6    Period Weeks    Status On-going      PT LONG TERM GOAL #4   Title Patient will demonstrate at least 25% improvement in lumbar ROM in all planes for improved ability to move trunk for housework.    Baseline see objective section    Time 6    Period Weeks    Status On-going      PT LONG TERM GOAL #5   Title Patient will be able to step into bathtub/shower combination and be able to pick up her feet with ease to wash them to demonstrate improved functional tolerance.    Time 4    Period Weeks    Status New    Target Date 06/26/20      Additional Long Term Goals   Additional Long Term Goals Yes      PT LONG TERM GOAL #6   Title Patient will report being able to sweep or mop without severe exhaustion afterwards to demonstrate improved functional endurance.    Time 4    Period Weeks    Status New    Target Date 06/26/20                 Plan - 06/07/20 1503    Clinical Impression Statement Pt limited with emotional concerns of friends and family dealing with hard times, reminded deep breathing to help lower CNS stress levels to assist wiht stressful times and pain control.  Continued session focus with lumbar moblity and proximal strengthening.  Pt guarded through session, improved mobility with reps.  EOS with manual STM to address significant tightness Lt gluteal region with reports of relief and improved gait mechanics following.    Personal Factors and Comorbidities Comorbidity 3+;Fitness;Past/Current Experience;Time since onset of injury/illness/exacerbation;Social Background      Comorbidities anxiety, depression, HTN, carotid artery blockage on L, chronic LBP, chronic hip pain, sedentary lifestyle    Examination-Activity Limitations Bathing;Bed Mobility;Bend;Caring for Others;Carry;Continence;Dressing;Hygiene/Grooming;Lift;Locomotion Level;Sit;Squat;Stairs;Stand;Transfers    Examination-Participation Restrictions Cleaning;Church;Community Activity;Driving;Meal Prep;Shop;Volunteer;Yard Work    [employer] Evolving/Moderate complexity    Clinical Decision Making Moderate    Rehab Potential Fair    PT Frequency 2x / week    PT Duration 12 weeks    PT Treatment/Interventions ADLs/Self Care Home Management;Aquatic Therapy;Biofeedback;Cryotherapy;Electrical Stimulation;Iontophoresis 4mg /ml Dexamethasone;Moist Heat;Traction;Ultrasound;DME Instruction;Gait training;Stair training;Functional mobility training;Therapeutic activities;Therapeutic exercise;Balance training;Neuromuscular re-education;Patient/family education;Orthotic Fit/Training;Manual techniques;Manual lymph drainage;Passive range of motion;Dry needling;Energy conservation;Splinting;Taping;Spinal Manipulations;Joint Manipulations    PT Next Visit Plan focus on lumbar ROM, functional ROM and HEP.    PT Home Exercise Plan 02/23/20: ab set; 6/2: LTR and bridges; 03/07/20: diaphragmatic breathing.;6/28 90/90 supportive position and leg press into wall; 04/06/2020: decomression exercises;           Patient will benefit from skilled therapeutic intervention in order to improve the following deficits and impairments:  Abnormal gait, Decreased activity tolerance, Decreased balance, Decreased mobility, Decreased endurance, Decreased range of motion, Decreased strength, Difficulty walking, Increased muscle spasms, Impaired perceived functional ability, Impaired sensation, Improper body mechanics, Pain  Visit Diagnosis: Low back pain, unspecified back pain laterality, unspecified chronicity, unspecified  whether sciatica present  Muscle weakness (generalized)  Other abnormalities of gait and mobility  Other symptoms and signs involving the musculoskeletal system     Problem List Patient Active Problem List   Diagnosis Date Noted  . Chronic diastolic HF (heart failure) ([place]) 08/10/2018  . GERD (gastroesophageal reflux disease) 08/09/2018  . Unstable angina ([place]) 08/09/2018  . Carotid artery disease ([place]) 07/24/2017  . AAA (abdominal aortic aneurysm) without rupture ([place]) 01/05/2013  . Other malaise and fatigue 01/05/2013  . Palpitations 01/05/2013  . Tobacco use disorder 12/02/2012  . Coronary artery disease   . Hyperlipidemia   . Hypertension   . SHORTNESS OF BREATH 09/04/2009  . CHEST PAIN 09/04/2009   Ihor Austin, LPTA/CLT; CBIS [phone]  Aldona Lento 06/07/2020, 3:44 PM  [facility] [address] [place], [place], [address] Phone: [phone]   Fax:  [phone]  Name: JACORIA KEIFFER MRN: [id number] Date of Birth: 1966/03/03

## 2020-06-11 NOTE — Addendum Note (Signed)
Addended by: Leeroy Cha on: 06/11/2020 01:25 PM   Modules accepted: Orders

## 2020-06-12 ENCOUNTER — Ambulatory Visit (HOSPITAL_COMMUNITY): Payer: Medicare HMO

## 2020-06-12 ENCOUNTER — Telehealth (HOSPITAL_COMMUNITY): Payer: Self-pay

## 2020-06-12 NOTE — Telephone Encounter (Signed)
pt cancelled appts for this week because she has a sore throat

## 2020-06-14 ENCOUNTER — Encounter (HOSPITAL_COMMUNITY): Payer: Medicare HMO

## 2020-06-15 DIAGNOSIS — E538 Deficiency of other specified B group vitamins: Secondary | ICD-10-CM | POA: Diagnosis not present

## 2020-06-15 DIAGNOSIS — E559 Vitamin D deficiency, unspecified: Secondary | ICD-10-CM | POA: Diagnosis not present

## 2020-06-15 DIAGNOSIS — F3340 Major depressive disorder, recurrent, in remission, unspecified: Secondary | ICD-10-CM | POA: Diagnosis not present

## 2020-06-15 DIAGNOSIS — M797 Fibromyalgia: Secondary | ICD-10-CM | POA: Diagnosis not present

## 2020-06-15 DIAGNOSIS — I119 Hypertensive heart disease without heart failure: Secondary | ICD-10-CM | POA: Diagnosis not present

## 2020-06-15 DIAGNOSIS — F411 Generalized anxiety disorder: Secondary | ICD-10-CM | POA: Diagnosis not present

## 2020-06-15 DIAGNOSIS — J449 Chronic obstructive pulmonary disease, unspecified: Secondary | ICD-10-CM | POA: Diagnosis not present

## 2020-06-19 ENCOUNTER — Encounter (HOSPITAL_COMMUNITY): Payer: Medicare HMO | Admitting: Physical Therapy

## 2020-06-19 ENCOUNTER — Telehealth (HOSPITAL_COMMUNITY): Payer: Self-pay | Admitting: Physical Therapy

## 2020-06-19 NOTE — Telephone Encounter (Signed)
pt called this morning and lmonvm that she wants to discontinue therapy due to she has a lot going on in her life but she feels that she is also doing much better than before.

## 2020-06-21 ENCOUNTER — Encounter (HOSPITAL_COMMUNITY): Payer: Medicare HMO | Admitting: Physical Therapy

## 2020-06-26 ENCOUNTER — Encounter (HOSPITAL_COMMUNITY): Payer: Medicare HMO | Admitting: Physical Therapy

## 2020-06-27 DIAGNOSIS — M545 Low back pain: Secondary | ICD-10-CM | POA: Diagnosis not present

## 2020-06-27 DIAGNOSIS — G629 Polyneuropathy, unspecified: Secondary | ICD-10-CM | POA: Diagnosis not present

## 2020-06-27 DIAGNOSIS — M5416 Radiculopathy, lumbar region: Secondary | ICD-10-CM | POA: Diagnosis not present

## 2020-06-28 ENCOUNTER — Encounter (HOSPITAL_COMMUNITY): Payer: Medicare HMO | Admitting: Physical Therapy

## 2020-07-02 ENCOUNTER — Telehealth: Payer: Self-pay

## 2020-07-02 NOTE — Telephone Encounter (Signed)
Pet patients request I called her today @ 8:50am. Unable to connect with patient however I left a message for patient to return my call.

## 2020-07-05 DIAGNOSIS — M5136 Other intervertebral disc degeneration, lumbar region: Secondary | ICD-10-CM | POA: Diagnosis not present

## 2020-08-01 DIAGNOSIS — N760 Acute vaginitis: Secondary | ICD-10-CM | POA: Diagnosis not present

## 2020-08-01 DIAGNOSIS — Z1231 Encounter for screening mammogram for malignant neoplasm of breast: Secondary | ICD-10-CM | POA: Diagnosis not present

## 2020-08-01 DIAGNOSIS — Z6839 Body mass index (BMI) 39.0-39.9, adult: Secondary | ICD-10-CM | POA: Diagnosis not present

## 2020-08-01 DIAGNOSIS — G8929 Other chronic pain: Secondary | ICD-10-CM | POA: Diagnosis not present

## 2020-08-01 DIAGNOSIS — Z779 Other contact with and (suspected) exposures hazardous to health: Secondary | ICD-10-CM | POA: Diagnosis not present

## 2020-08-01 DIAGNOSIS — Z124 Encounter for screening for malignant neoplasm of cervix: Secondary | ICD-10-CM | POA: Diagnosis not present

## 2020-09-04 DIAGNOSIS — J40 Bronchitis, not specified as acute or chronic: Secondary | ICD-10-CM | POA: Diagnosis not present

## 2020-09-04 DIAGNOSIS — K2980 Duodenitis without bleeding: Secondary | ICD-10-CM | POA: Diagnosis not present

## 2020-10-09 DIAGNOSIS — M9903 Segmental and somatic dysfunction of lumbar region: Secondary | ICD-10-CM | POA: Diagnosis not present

## 2020-10-09 DIAGNOSIS — M9902 Segmental and somatic dysfunction of thoracic region: Secondary | ICD-10-CM | POA: Diagnosis not present

## 2020-10-09 DIAGNOSIS — M5136 Other intervertebral disc degeneration, lumbar region: Secondary | ICD-10-CM | POA: Diagnosis not present

## 2020-10-09 DIAGNOSIS — M5134 Other intervertebral disc degeneration, thoracic region: Secondary | ICD-10-CM | POA: Diagnosis not present

## 2020-10-23 DIAGNOSIS — M5136 Other intervertebral disc degeneration, lumbar region: Secondary | ICD-10-CM | POA: Diagnosis not present

## 2020-10-23 DIAGNOSIS — M9902 Segmental and somatic dysfunction of thoracic region: Secondary | ICD-10-CM | POA: Diagnosis not present

## 2020-10-23 DIAGNOSIS — M5134 Other intervertebral disc degeneration, thoracic region: Secondary | ICD-10-CM | POA: Diagnosis not present

## 2020-10-23 DIAGNOSIS — M9903 Segmental and somatic dysfunction of lumbar region: Secondary | ICD-10-CM | POA: Diagnosis not present

## 2020-10-31 ENCOUNTER — Ambulatory Visit: Payer: Medicare HMO | Admitting: Gastroenterology

## 2020-11-15 ENCOUNTER — Telehealth: Payer: Self-pay | Admitting: Student

## 2020-11-15 DIAGNOSIS — I10 Essential (primary) hypertension: Secondary | ICD-10-CM | POA: Diagnosis not present

## 2020-11-15 DIAGNOSIS — R079 Chest pain, unspecified: Secondary | ICD-10-CM | POA: Diagnosis not present

## 2020-11-15 DIAGNOSIS — I491 Atrial premature depolarization: Secondary | ICD-10-CM | POA: Diagnosis not present

## 2020-11-15 DIAGNOSIS — G8929 Other chronic pain: Secondary | ICD-10-CM | POA: Diagnosis not present

## 2020-11-15 DIAGNOSIS — I252 Old myocardial infarction: Secondary | ICD-10-CM | POA: Diagnosis not present

## 2020-11-15 DIAGNOSIS — E785 Hyperlipidemia, unspecified: Secondary | ICD-10-CM | POA: Diagnosis not present

## 2020-11-15 DIAGNOSIS — Z20822 Contact with and (suspected) exposure to covid-19: Secondary | ICD-10-CM | POA: Diagnosis not present

## 2020-11-15 DIAGNOSIS — D751 Secondary polycythemia: Secondary | ICD-10-CM | POA: Diagnosis not present

## 2020-11-15 DIAGNOSIS — R519 Headache, unspecified: Secondary | ICD-10-CM | POA: Diagnosis not present

## 2020-11-15 DIAGNOSIS — R001 Bradycardia, unspecified: Secondary | ICD-10-CM | POA: Diagnosis not present

## 2020-11-15 NOTE — Telephone Encounter (Signed)
Please add pre-op to her up coming appointment 12/06/20 with Bernerd Pho, [place]. Surgery scheduled for 01/16/21.

## 2020-11-15 NOTE — Telephone Encounter (Signed)
   [facility] Pre-operative Risk Assessment    HEARTCARE STAFF: - Please ensure there is not already an duplicate clearance open for this procedure. - Under Visit Info/Reason for Call, type in Other and utilize the format Clearance MM/DD/YY or Clearance TBD. Do not use dashes or single digits. - If request is for dental extraction, please clarify the # of teeth to be extracted.  Request for surgical clearance:  1. What type of surgery is being performed? Right total arthoplasty   2. When is this surgery scheduled? 01/16/21  3. What type of clearance is required (medical clearance vs. Pharmacy clearance to hold med vs. Both)?both   4. Are there any medications that need to be held prior to surgery and how long? Please advise   5. Practice name and name of physician performing surgery? Dr frank alusio  6. What is the office phone number? [phone]   7.   What is the office fax number? [phone]  8.   Anesthesia type (None, local, MAC, general) ? choice   Jannet Askew 11/15/2020, 10:44 AM  _________________________________________________________________   (provider comments below)

## 2020-11-15 NOTE — Telephone Encounter (Signed)
appt notes reflect need pre op clearance. Will forward notes to PA for upcoming appt.

## 2020-11-16 ENCOUNTER — Telehealth: Payer: Self-pay | Admitting: Student

## 2020-11-16 ENCOUNTER — Encounter: Payer: Self-pay | Admitting: Student

## 2020-11-16 ENCOUNTER — Other Ambulatory Visit: Payer: Self-pay

## 2020-11-16 ENCOUNTER — Ambulatory Visit (INDEPENDENT_AMBULATORY_CARE_PROVIDER_SITE_OTHER): Payer: Medicare HMO | Admitting: Student

## 2020-11-16 VITALS — BP 138/82 | HR 64 | Ht 64.5 in | Wt 226.0 lb

## 2020-11-16 DIAGNOSIS — I6523 Occlusion and stenosis of bilateral carotid arteries: Secondary | ICD-10-CM | POA: Diagnosis not present

## 2020-11-16 DIAGNOSIS — I25118 Atherosclerotic heart disease of native coronary artery with other forms of angina pectoris: Secondary | ICD-10-CM | POA: Diagnosis not present

## 2020-11-16 DIAGNOSIS — I10 Essential (primary) hypertension: Secondary | ICD-10-CM | POA: Diagnosis not present

## 2020-11-16 DIAGNOSIS — Z01810 Encounter for preprocedural cardiovascular examination: Secondary | ICD-10-CM

## 2020-11-16 DIAGNOSIS — Z01818 Encounter for other preprocedural examination: Secondary | ICD-10-CM | POA: Diagnosis not present

## 2020-11-16 NOTE — Progress Notes (Signed)
Cardiology Office Note    Date:  11/17/2020   ID:  Tzirel, Leonor 1965/11/18, MRN [id number]  PCP:  Carolee Rota, [facility] Cardiologist: Previously followed by Dr. Bronson Ing --> Needs to switch to new MD  Chief Complaint  Patient presents with  . Follow-up    Preoperative Clearance    History of Present Illness:    Shannon [name] is a 55 y.o. female with past medical history of CAD (s/p DES to LCx in 2014, cath in 07/2018 showing patent stent with mild to moderate nonobstructive disease involving the mid-LAD and RCA), carotid artery stenosis (known LICA occlusion), HTN, HLD and tobacco use who presents to the office today for cardiac clearance.  She most recently had a telehealth visit with Dr. Bronson Ing in 10/2019 and reported occasional chest tightness but denied any exertional pain. Did experience palpitations when consuming too much caffeine. Was continued on her current medical therapy at that time.  The office received cardiac clearance paperwork on 11/15/2020 for upcoming right total hip arthroplasty which is scheduled in 12/2020, therefore a cardiac clearance visit was arranged.  In talking with the patient today, she reports she was in the ED at [facility] yesterday for elevated BP as her systolic readings had been greater than 200 when checked at home but was in the 160's on recheck. By review of labs, troponin values were negative and she was discharged home. She checked her BP at home earlier today and it was well-controlled, at 138/82 during today's visit.   She reports not being active at baseline due to significant hip pain. She is able to walk up a flight of stairs without chest pain but reports she gets short of breath with minimal activity. No recent orthopnea, PND or lower extremity edema.   Past Medical History:  Diagnosis Date  . Anemia   . Anxiety   . Arthritis    oa needs hip replacement on right  . Cancer ([place])    squamous cell areas  removed from vulva and pre caner areas removed from leg   . Carotid artery occlusion   . Chronic diastolic HF (heart failure) ([place])    pt denies  . COPD (chronic obstructive pulmonary disease) ([place])    pt denies  . Coronary artery disease    LHC 11/25/12 90% stenosis mid LCx & otherwise nonobstructive dz w/ EF 60-65% S/p PTCA/DES to LCx  . Depression   . Elevated cholesterol   . Exertional dyspnea    chronic  . GERD (gastroesophageal reflux disease)   . History of cardiovascular stress test 06/2015   low risk  . HPV in female   . Hyperlipidemia   . Hypertension   . MI (myocardial infarction) ([place]) fe 17, 2014  . Obesity   . Polycystic ovary disease   . Skin tear of upper arm without complication, left, sequela    small skin tear left upper arm no drainage for 1 week  . Tobacco abuse     Past Surgical History:  Procedure Laterality Date  . CORONARY ANGIOPLASTY WITH STENT PLACEMENT    . LEFT HEART CATH AND CORONARY ANGIOGRAPHY N/A 08/09/2018   Procedure: LEFT HEART CATH AND CORONARY ANGIOGRAPHY;  Surgeon: Nelva Bush, MD;  Location: [facility];  Service: Cardiovascular;  Laterality: N/A;  . LEFT HEART CATHETERIZATION WITH CORONARY ANGIOGRAM N/A 11/15/2012   Procedure: LEFT HEART CATHETERIZATION WITH CORONARY ANGIOGRAM;  Surgeon: Thayer Headings, MD;  Location: [facility] CATH LAB;  Service: Cardiovascular;  Laterality: N/A;  . PERCUTANEOUS CORONARY STENT INTERVENTION (PCI-S)  11/15/2012   Procedure: PERCUTANEOUS CORONARY STENT INTERVENTION (PCI-S);  Surgeon: Thayer Headings, MD;  Location: [facility] CATH LAB;  Service: Cardiovascular;;  . skin cancer removed right lower leg Right   . TONSILLECTOMY    . uterus ablation    . VULVECTOMY N/A 11/08/2019   Procedure: excision of VULVA, vulvoscopy;  Surgeon: Dian Queen, MD;  [facility];  Service: Gynecology;  Laterality: N/A;    Current Medications: Outpatient Medications Prior to Visit  Medication Sig  Dispense Refill  . albuterol (PROVENTIL HFA;VENTOLIN HFA) 108 (90 Base) MCG/ACT inhaler Inhale 2 puffs into the lungs every 4 (four) hours as needed for wheezing or shortness of breath. 1 Inhaler 0  . ALPRAZolam (XANAX) 1 MG tablet Take 1 mg by mouth 2 (two) times daily as needed.  2  . aspirin EC 81 MG tablet Take 81 mg by mouth daily.     . bisoprolol-hydrochlorothiazide (ZIAC) 5-6.25 MG tablet     . escitalopram (LEXAPRO) 20 MG tablet Take 1 tablet (20 mg total) by mouth daily. 30 tablet 0  . fluticasone (FLONASE) 50 MCG/ACT nasal spray at bedtime.     . furosemide (LASIX) 20 MG tablet Take 20 mg by mouth as needed.    . nitroGLYCERIN (NITROSTAT) 0.4 MG SL tablet Place 1 tablet (0.4 mg total) under the tongue every 5 (five) minutes as needed for chest pain (up to 3 doses). 25 tablet 3  . nystatin-triamcinolone (MYCOLOG II) cream     . Oxycodone HCl 10 MG TABS     . pantoprazole (PROTONIX) 40 MG tablet Take 40 mg by mouth at bedtime.    . rosuvastatin (CRESTOR) 20 MG tablet Take 1 tablet (20 mg total) by mouth at bedtime. 30 tablet 6  . triamcinolone cream (KENALOG) 0.1 % Apply 1 application topically 2 (two) times daily. 454 g 0  . NON FORMULARY Steroid injection to right hip 11-25-2019    . ibuprofen (ADVIL) 200 MG tablet Take 200 mg by mouth every 6 (six) hours as needed.     No facility-administered medications prior to visit.     Allergies:   Clomiphene citrate, Morphine and related, Sulfa antibiotics, and Clomiphene   Social History   Socioeconomic History  . Marital status: Legally Separated    Spouse name: Not on file  . Number of children: Not on file  . Years of education: Not on file  . Highest education level: Not on file  Occupational History  . Not on file  Tobacco Use  . Smoking status: Current Every Day Smoker    Packs/day: 2.00    Years: 40.00    Pack years: 80.00    Types: Cigarettes    Start date: 03/29/1979  . Smokeless tobacco: Never Used  . Tobacco  comment: Currently 2ppd  Vaping Use  . Vaping Use: Never used  Substance and Sexual Activity  . Alcohol use: Yes    Alcohol/week: 0.0 standard drinks    Comment: rare  . Drug use: No  . Sexual activity: Not Currently    Partners: Male  Other Topics Concern  . Not on file  Social History Narrative   Lives with husband in a 3 story home.  Has no children.     On disability.     Education: high school, some college.   Social Determinants of Health   Financial Resource Strain: Not on file  Food  Insecurity: Not on file  Transportation Needs: Not on file  Physical Activity: Not on file  Stress: Not on file  Social Connections: Not on file     Family History:  The patient's family history includes Anxiety disorder in her maternal grandmother and paternal grandmother; Cerebral aneurysm in her father; Depression in her mother and paternal grandmother; Heart attack in her father; Heart disease in her father; OCD in her paternal grandmother.   Review of Systems:   Please see the history of present illness.     General:  No chills, fever, night sweats or weight changes. Positive for hip pain.  Cardiovascular:  No chest pain,  edema, orthopnea, palpitations, paroxysmal nocturnal dyspnea. Positive for dyspnea on exetion.  Dermatological: No rash, lesions/masses Respiratory: No cough, dyspnea Urologic: No hematuria, dysuria Abdominal:   No nausea, vomiting, diarrhea, bright red blood per rectum, melena, or hematemesis Neurologic:  No visual changes, wkns, changes in mental status. All other systems reviewed and are otherwise negative except as noted above.   Physical Exam:    VS:  BP 138/82   Pulse 64   Ht 5' 4.5" (1.638 m)   Wt 226 lb (102.5 kg)   SpO2 98%   BMI 38.19 kg/m    General: Well developed, well nourished,female appearing in no acute distress. Head: Normocephalic, atraumatic. Neck: No carotid bruits. JVD not elevated.  Lungs: Respirations regular and unlabored,  without wheezes or rales.  Heart: Regular rate and rhythm. No S3 or S4.  No murmur, no rubs, or gallops appreciated. Abdomen: Appears non-distended. No obvious abdominal masses. Msk:  Strength and tone appear normal for age. No obvious joint deformities or effusions. Extremities: No clubbing or cyanosis. No lower extremity edema.  Distal pedal pulses are 2+ bilaterally. Neuro: Alert and oriented X 3. Moves all extremities spontaneously. No focal deficits noted. Psych:  Responds to questions appropriately with a normal affect. Skin: No rashes or lesions noted  Wt Readings from Last 3 Encounters:  11/16/20 226 lb (102.5 kg)  12/17/19 230 lb (104.3 kg)  11/01/19 235 lb (106.6 kg)      Studies/Labs Reviewed:   EKG:  EKG is ordered today.  The ekg ordered today demonstrates sinus bradycardia, HR 59 with no acute ST abnormalities.   Recent Labs: 12/17/2019: B Natriuretic Peptide 68.0; BUN 13; Creatinine, Ser 0.88; Hemoglobin 13.5; Platelets 141; Potassium 3.6; Sodium 136   Lipid Panel    Component Value Date/Time   CHOL 194 08/10/2018 0539   TRIG 264 (H) 08/10/2018 0539   HDL 36 (L) 08/10/2018 0539   CHOLHDL 5.4 08/10/2018 0539   VLDL 53 (H) 08/10/2018 0539   LDLCALC 105 (H) 08/10/2018 0539   LDLDIRECT 200.3 09/04/2009 1308    Additional studies/ records that were reviewed today include:   Echocardiogram: 02/2018 Study Conclusions   - Procedure narrative: Transthoracic echocardiography. Image  quality was suboptimal. Contrast enhancement was employed.  - Left ventricle: The cavity size was normal. Wall thickness was  increased in a pattern of mild LVH. Systolic function was normal.  The estimated ejection fraction was in the range of 55% to 60%.  Wall motion was normal; there were no regional wall motion  abnormalities. Left ventricular diastolic function parameters  were normal.   Cardiac Catheterization: 07/2018 Conclusions: 1. Mild to moderate,  non-obstructive coronary artery disease involving the mid LAD and RCA. 2. Widely patent proximal/mid LCx stent. 3. Normal left ventricular contraction with mildly elevated filling pressure.  Recommendations: 1. Medical therapy and  aggressive secondary prevention, including smoking cessation, weight loss, and lipid control.  Consider increasing statin therapy if LDL not at goal (<70). 2. Start furosemide 20 mg daily for component of diastolic heart failure.  Recommend Aspirin 81mg  daily for moderate CAD.    Assessment:    1. Coronary artery disease of native artery of native heart with stable angina pectoris ([place])   2. Essential hypertension   3. Bilateral carotid artery stenosis   4. Pre-op evaluation      Plan:   In order of problems listed above:  1. CAD - She is s/p DES to LCx in 2014 with cath in 07/2018 showing patent stent with mild to moderate nonobstructive disease involving the mid-LAD and RCA.  - She denies any recent chest pain but does report dyspnea on exertion which is possibly secondary to deconditioning but cannot rule-out an ischemic etiology. Given her symptoms and upcoming surgery, will plan for a Lexiscan Myoview for ischemic evaluation. Unable to walk on the treadmill given her hip pain.  - Continue ASA 81mg  daily, Crestor 20mg  daily and Bisoprolol 5mg  daily.   2. HTN - BP is at 138/62 during today's visit but was elevated yesterday as outlined above. I have encouraged her to keep a BP log and return this in several weeks. Continue Bisoprolol-HCTZ 5-6.25mg  daily. Would not be able to titrate this given her baseline bradycardia so if BP remais elevated, would consider the addition of an ARB or Amlodipine.   3. Carotid Artery Stenosis - She has a known LICA occlusion and RICA stenosis which is followed by Vascular Surgery at [facility].  - Continue ASA and Crestor.   4. Preoperative Cardiac Clearance for Right Total Hip Arthroplasty - She denies any recent  chest pain but does report dyspnea on exertion as outlined above. Will plan to obtain a Lexiscan Myoview to assess for ischemia. If no significant ischemia, she would be of acceptable risk to proceed from a cardiac perspective.    Medication Adjustments/Labs and Tests Ordered: Current medicines are reviewed at length with the patient today.  Concerns regarding medicines are outlined above.  Medication changes, Labs and Tests ordered today are listed in the Patient Instructions below. Patient Instructions  Medication Instructions:  Your physician recommends that you continue on your current medications as directed. Please refer to the Current Medication list given to you today.  *If you need a refill on your cardiac medications before your next appointment, please call your pharmacy*   Lab Work: None If you have labs (blood work) drawn today and your tests are completely normal, you will receive your results only by: Marland Kitchen MyChart Message (if you have MyChart) OR . A paper copy in the mail If you have any lab test that is abnormal or we need to change your treatment, we will call you to review the results.   Testing/Procedures: Your physician has requested that you have a lexiscan myoview. For further information please visit [email or web address]. Please follow instruction sheet, as given.     Follow-Up: At [facility], you and your health needs are our priority.  As part of our continuing mission to provide you with exceptional heart care, we have created designated Provider Care Teams.  These Care Teams include your primary Cardiologist (physician) and Advanced Practice Providers (APPs -  Physician Assistants and Nurse Practitioners) who all work together to provide you with the care you need, when you need it.  We recommend signing up for the patient portal called "MyChart".  Sign up information is provided on this After Visit Summary.  MyChart is used to connect with patients for  Virtual Visits (Telemedicine).  Patients are able to view lab/test results, encounter notes, upcoming appointments, etc.  Non-urgent messages can be sent to your provider as well.   To learn more about what you can do with MyChart, go to [email or web address].    Your next appointment:   6 month(s)  The format for your next appointment:   In Person  Provider:   You may see Carlyle Dolly, MD/ Rozann Lesches, MD or one of the following Advanced Practice Providers on your designated Care Team:    Bernerd Pho, [place]     Other Instructions Please keep a BP log for 2-3 weeks and call us with readings.        Signed, Erma Heritage, PA-C  11/17/2020 11:02 AM    [facility] [address] [place], [place] [address] Phone: [phone] Fax: [phone]

## 2020-11-16 NOTE — Patient Instructions (Addendum)
Medication Instructions:  Your physician recommends that you continue on your current medications as directed. Please refer to the Current Medication list given to you today.  *If you need a refill on your cardiac medications before your next appointment, please call your pharmacy*   Lab Work: None If you have labs (blood work) drawn today and your tests are completely normal, you will receive your results only by: Marland Kitchen MyChart Message (if you have MyChart) OR . A paper copy in the mail If you have any lab test that is abnormal or we need to change your treatment, we will call you to review the results.   Testing/Procedures: Your physician has requested that you have a lexiscan myoview. For further information please visit [email or web address]. Please follow instruction sheet, as given.     Follow-Up: At [facility], you and your health needs are our priority.  As part of our continuing mission to provide you with exceptional heart care, we have created designated Provider Care Teams.  These Care Teams include your primary Cardiologist (physician) and Advanced Practice Providers (APPs -  Physician Assistants and Nurse Practitioners) who all work together to provide you with the care you need, when you need it.  We recommend signing up for the patient portal called "MyChart".  Sign up information is provided on this After Visit Summary.  MyChart is used to connect with patients for Virtual Visits (Telemedicine).  Patients are able to view lab/test results, encounter notes, upcoming appointments, etc.  Non-urgent messages can be sent to your provider as well.   To learn more about what you can do with MyChart, go to [email or web address].    Your next appointment:   6 month(s)  The format for your next appointment:   In Person  Provider:   You may see Carlyle Dolly, MD/ Rozann Lesches, MD or one of the following Advanced Practice Providers on your designated Care Team:     Bernerd Pho, [place]     Other Instructions Please keep a BP log for 2-3 weeks and call us with readings.

## 2020-11-16 NOTE — Telephone Encounter (Signed)
New patient     Patient wants to know if you can call her in a Zpak to the [facility] [place] she thinks she has a sinus infection she is having pain in her ear and her sinuses are congested

## 2020-11-16 NOTE — Telephone Encounter (Signed)
Contacted patient and let her know that she should first contact her primary care physician. She verbalized understanding and said that she would.

## 2020-11-17 ENCOUNTER — Encounter: Payer: Self-pay | Admitting: Student

## 2020-11-26 DIAGNOSIS — F17210 Nicotine dependence, cigarettes, uncomplicated: Secondary | ICD-10-CM | POA: Diagnosis not present

## 2020-11-26 DIAGNOSIS — Z7982 Long term (current) use of aspirin: Secondary | ICD-10-CM | POA: Diagnosis not present

## 2020-11-26 DIAGNOSIS — I10 Essential (primary) hypertension: Secondary | ICD-10-CM | POA: Diagnosis not present

## 2020-11-26 DIAGNOSIS — E785 Hyperlipidemia, unspecified: Secondary | ICD-10-CM | POA: Diagnosis not present

## 2020-11-26 DIAGNOSIS — I6523 Occlusion and stenosis of bilateral carotid arteries: Secondary | ICD-10-CM | POA: Diagnosis not present

## 2020-11-28 ENCOUNTER — Encounter (HOSPITAL_COMMUNITY): Admission: RE | Admit: 2020-11-28 | Payer: Medicare HMO | Source: Ambulatory Visit

## 2020-11-28 ENCOUNTER — Encounter (HOSPITAL_COMMUNITY): Payer: Medicare HMO

## 2020-12-06 ENCOUNTER — Ambulatory Visit: Payer: Medicare HMO | Admitting: Student

## 2020-12-07 ENCOUNTER — Ambulatory Visit: Payer: Medicare HMO | Admitting: Gastroenterology

## 2020-12-19 ENCOUNTER — Telehealth: Payer: Self-pay | Admitting: Student

## 2020-12-19 NOTE — Telephone Encounter (Signed)
Spoke with pt who states that she is afraid to have stress test done. She reports that when she walks up steps she is no more SOB that normal. She states that if she has the stress test that it will make her not want to have her surgery and that she really needs this surgery. Informed pt that the stress test may be needed in order to clear her for her surgery. Pt states that if Shannon [name] insist on her doing the stress test that she will. Please advise.

## 2020-12-19 NOTE — Telephone Encounter (Signed)
    I called and spoke with the patient in regards to her stress test. She is nervous about possible side effects of Lexiscan and we reviewed the possibilities of what she could feel during the test but that they are typically very transient and go away within a few minutes. Also reviewed that we have a reversal agent if needed. Given that she is not able to perform 4 METS of activity without dyspnea, I told her that if she did not wish to pursue stress testing then we could send a form back to her Orthopedist saying that she is at least intermediate risk given her history. At this time, she is in agreement to proceed with stress testing which is scheduled for next week.  Signed, Erma Heritage, PA-C 12/19/2020, 1:14 PM Pager: [phone]

## 2020-12-19 NOTE — Telephone Encounter (Signed)
New message    Patient would like call back regarding stress test

## 2020-12-20 DIAGNOSIS — M160 Bilateral primary osteoarthritis of hip: Secondary | ICD-10-CM | POA: Diagnosis not present

## 2020-12-27 ENCOUNTER — Encounter (HOSPITAL_COMMUNITY): Payer: Self-pay

## 2020-12-27 ENCOUNTER — Other Ambulatory Visit: Payer: Self-pay

## 2020-12-27 ENCOUNTER — Encounter (HOSPITAL_COMMUNITY)
Admission: RE | Admit: 2020-12-27 | Discharge: 2020-12-27 | Disposition: A | Discharge status: Home / Self Care | Payer: Medicare HMO | Source: Ambulatory Visit | Attending: Student | Admitting: Student

## 2020-12-27 ENCOUNTER — Ambulatory Visit (HOSPITAL_COMMUNITY)
Admission: RE | Admit: 2020-12-27 | Discharge: 2020-12-27 | Disposition: A | Discharge status: Home / Self Care | Payer: Medicare HMO | Source: Ambulatory Visit | Attending: Student | Admitting: Student

## 2020-12-27 DIAGNOSIS — Z01810 Encounter for preprocedural cardiovascular examination: Secondary | ICD-10-CM | POA: Diagnosis not present

## 2020-12-27 DIAGNOSIS — Z01818 Encounter for other preprocedural examination: Secondary | ICD-10-CM

## 2020-12-27 DIAGNOSIS — I25118 Atherosclerotic heart disease of native coronary artery with other forms of angina pectoris: Secondary | ICD-10-CM

## 2020-12-27 HISTORY — DX: Unspecified asthma, uncomplicated: J45.909

## 2020-12-27 LAB — NM MYOCAR MULTI W/SPECT W/WALL MOTION / EF
LV dias vol: 76 mL (ref 46–106)
LV sys vol: 24 mL
Peak HR: 80 {beats}/min
RATE: 0.29
Rest HR: 52 {beats}/min
SDS: 4
SRS: 4
SSS: 8
TID: 1.12

## 2020-12-27 MED ORDER — TECHNETIUM TC 99M TETROFOSMIN IV KIT
30.0000 | PACK | Freq: Once | INTRAVENOUS | Status: AC | PRN
  Administered 2020-12-27: 31 via INTRAVENOUS

## 2020-12-27 MED ORDER — SODIUM CHLORIDE FLUSH 0.9 % IV SOLN
INTRAVENOUS | Status: AC
  Administered 2020-12-27: 10 mL via INTRAVENOUS
  Filled 2020-12-27: qty 10

## 2020-12-27 MED ORDER — TECHNETIUM TC 99M TETROFOSMIN IV KIT
10.0000 | PACK | Freq: Once | INTRAVENOUS | Status: AC | PRN
  Administered 2020-12-27: 10.5 via INTRAVENOUS

## 2020-12-27 MED ORDER — REGADENOSON 0.4 MG/5ML IV SOLN
INTRAVENOUS | Status: AC
  Administered 2020-12-27: 0.4 mg via INTRAVENOUS
  Filled 2020-12-27: qty 5

## 2021-01-07 ENCOUNTER — Encounter (HOSPITAL_COMMUNITY): Payer: Medicare HMO

## 2021-01-07 DIAGNOSIS — F411 Generalized anxiety disorder: Secondary | ICD-10-CM | POA: Diagnosis not present

## 2021-01-07 DIAGNOSIS — I119 Hypertensive heart disease without heart failure: Secondary | ICD-10-CM | POA: Diagnosis not present

## 2021-01-07 DIAGNOSIS — I25119 Atherosclerotic heart disease of native coronary artery with unspecified angina pectoris: Secondary | ICD-10-CM | POA: Diagnosis not present

## 2021-01-07 DIAGNOSIS — I10 Essential (primary) hypertension: Secondary | ICD-10-CM | POA: Diagnosis not present

## 2021-01-07 DIAGNOSIS — R262 Difficulty in walking, not elsewhere classified: Secondary | ICD-10-CM | POA: Diagnosis not present

## 2021-01-07 DIAGNOSIS — F332 Major depressive disorder, recurrent severe without psychotic features: Secondary | ICD-10-CM | POA: Diagnosis not present

## 2021-01-07 DIAGNOSIS — J449 Chronic obstructive pulmonary disease, unspecified: Secondary | ICD-10-CM | POA: Diagnosis not present

## 2021-01-07 DIAGNOSIS — Z6838 Body mass index (BMI) 38.0-38.9, adult: Secondary | ICD-10-CM | POA: Diagnosis not present

## 2021-01-07 DIAGNOSIS — E782 Mixed hyperlipidemia: Secondary | ICD-10-CM | POA: Diagnosis not present

## 2021-01-07 NOTE — Progress Notes (Signed)
DUE TO COVID-19 ONLY ONE VISITOR IS ALLOWED TO COME WITH YOU AND STAY IN THE WAITING ROOM ONLY DURING PRE OP AND PROCEDURE DAY OF SURGERY. THE 1 VISITOR  MAY VISIT WITH YOU AFTER SURGERY IN YOUR PRIVATE ROOM DURING VISITING HOURS ONLY!  YOU NEED TO HAVE A COVID 19 TEST ON____4/18/2022 ___ @_______ , THIS TEST MUST BE DONE BEFORE SURGERY,  COVID TESTING SITE [address] [place] [address], IT IS ON THE RIGHT GOING OUT [address] APPROXIMATELY  2 MINUTES PAST [employer] ON THE RIGHT. ONCE YOUR COVID TEST IS COMPLETED,  PLEASE BEGIN THE QUARANTINE INSTRUCTIONS AS OUTLINED IN YOUR HANDOUT.                Shannon [name]  01/07/2021   Your procedure is scheduled on:       01/16/2021   Report to [facility] Main  Entrance   Report to admitting at    0700 AM     Call this number if you have problems the morning of surgery [phone]    REMEMBER: NO  SOLID FOOD CANDY OR GUM AFTER MIDNIGHT. CLEAR LIQUIDS UNTIL    0630AM        . NOTHING BY MOUTH EXCEPT CLEAR LIQUIDS UNTIL     0630AM   . PLEASE FINISH ENSURE DRINK PER SURGEON ORDER  WHICH NEEDS TO BE COMPLETED AT   [id number]    .      CLEAR LIQUID DIET   Foods Allowed                                                                    Coffee and tea, regular and decaf                            Fruit ices (not with fruit pulp)                                      Iced Popsicles                                    Carbonated beverages, regular and diet                                    Cranberry, grape and apple juices Sports drinks like Gatorade Lightly seasoned clear broth or consume(fat free) Sugar, honey syrup ___________________________________________________________________      BRUSH YOUR TEETH MORNING OF SURGERY AND RINSE YOUR MOUTH OUT, NO CHEWING GUM CANDY OR MINTS.     Take these medicines the morning of surgery with A SIP OF WATER:  INHALERS AS USUAL AND BRING, LEXAPRO., PROTONIX  DO NOT TAKE  ANY DIABETIC MEDICATIONS DAY OF YOUR SURGERY                               You may not have any metal on your body including hair pins and  piercings  Do not wear jewelry, make-up, lotions, powders or perfumes, deodorant             Do not wear nail polish on your fingernails.  Do not shave  48 hours prior to surgery.              Men may shave face and neck.   Do not bring valuables to the hospital. [place].  Contacts, dentures or bridgework may not be worn into surgery.  Leave suitcase in the car. After surgery it may be brought to your room.     Patients discharged the day of surgery will not be allowed to drive home. IF YOU ARE HAVING SURGERY AND GOING HOME THE SAME DAY, YOU MUST HAVE AN ADULT TO DRIVE YOU HOME AND BE WITH YOU FOR 24 HOURS. YOU MAY GO HOME BY TAXI OR UBER OR ORTHERWISE, BUT AN ADULT MUST ACCOMPANY YOU HOME AND STAY WITH YOU FOR 24 HOURS.  Name and phone number of your driver:  Special Instructions: N/A              Please read over the following fact sheets you were given: _____________________________________________________________________  [facility] - Preparing for Surgery Before surgery, you can play an important role.  Because skin is not sterile, your skin needs to be as free of germs as possible.  You can reduce the number of germs on your skin by washing with CHG (chlorahexidine gluconate) soap before surgery.  CHG is an antiseptic cleaner which kills germs and bonds with the skin to continue killing germs even after washing. Please DO NOT use if you have an allergy to CHG or antibacterial soaps.  If your skin becomes reddened/irritated stop using the CHG and inform your nurse when you arrive at Short Stay. Do not shave (including legs and underarms) for at least 48 hours prior to the first CHG shower.  You may shave your face/neck. Please follow these instructions carefully:  1.  Shower with CHG  Soap the night before surgery and the  morning of Surgery.  2.  If you choose to wash your hair, wash your hair first as usual with your  normal  shampoo.  3.  After you shampoo, rinse your hair and body thoroughly to remove the  shampoo.                           4.  Use CHG as you would any other liquid soap.  You can apply chg directly  to the skin and wash                       Gently with a scrungie or clean washcloth.  5.  Apply the CHG Soap to your body ONLY FROM THE NECK DOWN.   Do not use on face/ open                           Wound or open sores. Avoid contact with eyes, ears mouth and genitals (private parts).                       Wash face,  Genitals (private parts) with your normal soap.             6.  Wash thoroughly, paying special attention to the area where your surgery  will be performed.  7.  Thoroughly rinse your body with warm water from the neck down.  8.  DO NOT shower/wash with your normal soap after using and rinsing off  the CHG Soap.                9.  Pat yourself dry with a clean towel.            10.  Wear clean pajamas.            11.  Place clean sheets on your bed the night of your first shower and do not  sleep with pets. Day of Surgery : Do not apply any lotions/deodorants the morning of surgery.  Please wear clean clothes to the hospital/surgery center.  FAILURE TO FOLLOW THESE INSTRUCTIONS MAY RESULT IN THE CANCELLATION OF YOUR SURGERY PATIENT SIGNATURE_________________________________  NURSE SIGNATURE__________________________________  ________________________________________________________________________

## 2021-01-08 NOTE — H&P (Signed)
TOTAL HIP ADMISSION H&P  Patient is admitted for right total hip arthroplasty.  Subjective:  Chief Complaint: Right hip pain  HPI: Shannon [name], 55 y.o. female, has a history of pain and functional disability in the right hip due to arthritis and patient has failed non-surgical conservative treatments for greater than 12 weeks to include NSAID's and/or analgesics and activity modification. Onset of symptoms was gradual, starting several years ago with gradually worsening course since that time. The patient noted no past surgery on the right hip. Patient currently rates pain in the right hip at 8 out of 10 with activity. Patient has night pain, worsening of pain with activity and weight bearing, pain that interfers with activities of daily living and crepitus. Patient has evidence of severe bone-on-bone arthritis in the right hip with subchondral cystic formation by imaging studies. This condition presents safety issues increasing the risk of falls. There is no current active infection.  Patient Active Problem List   Diagnosis Date Noted  . Chronic diastolic HF (heart failure) ([place]) 08/10/2018  . GERD (gastroesophageal reflux disease) 08/09/2018  . Unstable angina ([place]) 08/09/2018  . Carotid artery disease ([place]) 07/24/2017  . AAA (abdominal aortic aneurysm) without rupture ([place]) 01/05/2013  . Other malaise and fatigue 01/05/2013  . Palpitations 01/05/2013  . Tobacco use disorder 12/02/2012  . Coronary artery disease   . Hyperlipidemia   . Hypertension   . SHORTNESS OF BREATH 09/04/2009  . CHEST PAIN 09/04/2009    Past Medical History:  Diagnosis Date  . Anemia   . Anxiety   . Arthritis    oa needs hip replacement on right  . Asthma   . Cancer ([place])    squamous cell areas removed from vulva and pre caner areas removed from leg   . Carotid artery occlusion   . Chronic diastolic HF (heart failure) ([place])    pt denies  . COPD (chronic obstructive pulmonary disease) ([place])    pt  denies  . Coronary artery disease    LHC 11/25/12 90% stenosis mid LCx & otherwise nonobstructive dz w/ EF 60-65% S/p PTCA/DES to LCx  . Depression   . Elevated cholesterol   . Exertional dyspnea    chronic  . GERD (gastroesophageal reflux disease)   . History of cardiovascular stress test 06/2015   low risk  . HPV in female   . Hyperlipidemia   . Hypertension   . MI (myocardial infarction) ([place]) fe 17, 2014  . Obesity   . Polycystic ovary disease   . Skin tear of upper arm without complication, left, sequela    small skin tear left upper arm no drainage for 1 week  . Tobacco abuse     Past Surgical History:  Procedure Laterality Date  . CORONARY ANGIOPLASTY WITH STENT PLACEMENT    . LEFT HEART CATH AND CORONARY ANGIOGRAPHY N/A 08/09/2018   Procedure: LEFT HEART CATH AND CORONARY ANGIOGRAPHY;  Surgeon: Nelva Bush, MD;  Location: [facility];  Service: Cardiovascular;  Laterality: N/A;  . LEFT HEART CATHETERIZATION WITH CORONARY ANGIOGRAM N/A 11/15/2012   Procedure: LEFT HEART CATHETERIZATION WITH CORONARY ANGIOGRAM;  Surgeon: Thayer Headings, MD;  Location: [facility] CATH LAB;  Service: Cardiovascular;  Laterality: N/A;  . PERCUTANEOUS CORONARY STENT INTERVENTION (PCI-S)  11/15/2012   Procedure: PERCUTANEOUS CORONARY STENT INTERVENTION (PCI-S);  Surgeon: Thayer Headings, MD;  Location: [facility] CATH LAB;  Service: Cardiovascular;;  . skin cancer removed right lower leg Right   . TONSILLECTOMY    .  uterus ablation    . VULVECTOMY N/A 11/08/2019   Procedure: excision of VULVA, vulvoscopy;  Surgeon: Dian Queen, MD;  Location: [facility];  Service: Gynecology;  Laterality: N/A;    Prior to Admission medications   Medication Sig Start Date End Date Taking? Authorizing Provider  albuterol (PROVENTIL HFA;VENTOLIN HFA) 108 (90 Base) MCG/ACT inhaler Inhale 2 puffs into the lungs every 4 (four) hours as needed for wheezing or shortness of breath. 12/21/15  Yes Ward,  Delice Bison, DO  ALPRAZolam (XANAX) 1 MG tablet Take 1 mg by mouth 2 (two) times daily as needed for anxiety. 06/12/17  Yes [provider]  aspirin EC 81 MG tablet Take 81 mg by mouth daily.    Yes [provider]  bisoprolol-hydrochlorothiazide (ZIAC) 5-6.25 MG tablet Take 1 tablet by mouth daily. 12/21/19  Yes [provider]  escitalopram (LEXAPRO) 20 MG tablet Take 20 mg by mouth daily.   Yes [provider]  fluticasone (FLONASE) 50 MCG/ACT nasal spray Place 2 sprays into both nostrils at bedtime.   Yes [provider]  furosemide (LASIX) 20 MG tablet Take 20 mg by mouth daily as needed for edema.   Yes [provider]  nitroGLYCERIN (NITROSTAT) 0.4 MG SL tablet Place 1 tablet (0.4 mg total) under the tongue every 5 (five) minutes as needed for chest pain (up to 3 doses). 11/16/12  Yes Hope, Jessica A, PA-C  Oxycodone HCl 10 MG TABS Take 10 mg by mouth 4 (four) times daily as needed (pain). 02/15/20  Yes [provider]  pantoprazole (PROTONIX) 40 MG tablet Take 20 mg by mouth 2 (two) times daily as needed (acid reflux).   Yes [provider]  rosuvastatin (CRESTOR) 20 MG tablet Take 1 tablet (20 mg total) by mouth at bedtime. 08/10/18  Yes Isaiah Serge, NP  triamcinolone cream (KENALOG) 0.1 % Apply 1 application topically 2 (two) times daily. Patient taking differently: Apply 1 application topically 2 (two) times daily as needed (irritation). 01/09/20  Yes Scot Jun, FNP    Allergies  Allergen Reactions  . Clomiphene Citrate Nausea And Vomiting  . Morphine And Related Other (See Comments)    PATIENT REFUSES THIS MEDICATION: states that she does not tolerate this medication well  . Sulfa Antibiotics Other (See Comments)    Not sure ? rash  . Clomiphene Rash    Social History   Socioeconomic History  . Marital status: Legally Separated    Spouse name: Not on file  . Number of children: Not on file  .  Years of education: Not on file  . Highest education level: Not on file  Occupational History  . Not on file  Tobacco Use  . Smoking status: Current Every Day Smoker    Packs/day: 2.00    Years: 40.00    Pack years: 80.00    Types: Cigarettes    Start date: 03/29/1979  . Smokeless tobacco: Never Used  . Tobacco comment: Currently 2ppd  Vaping Use  . Vaping Use: Never used  Substance and Sexual Activity  . Alcohol use: Yes    Alcohol/week: 0.0 standard drinks    Comment: rare  . Drug use: No  . Sexual activity: Not Currently    Partners: Male  Other Topics Concern  . Not on file  Social History Narrative   Lives with husband in a 3 story home.  Has no children.     On disability.     Education:  high school, some college.   Social Determinants of Health   Financial Resource Strain: Not on file  Food Insecurity: Not on file  Transportation Needs: Not on file  Physical Activity: Not on file  Stress: Not on file  Social Connections: Not on file  Intimate Partner Violence: Not on file    Tobacco Use: High Risk  . Smoking Tobacco Use: Current Every Day Smoker  . Smokeless Tobacco Use: Never Used   Social History   Substance and Sexual Activity  Alcohol Use Yes  . Alcohol/week: 0.0 standard drinks   Comment: rare    Family History  Problem Relation Age of Onset  . Depression Mother   . Anxiety disorder Maternal Grandmother   . Anxiety disorder Paternal Grandmother   . OCD Paternal Grandmother   . Depression Paternal Grandmother   . Heart attack Father        Deceased, 53  . Cerebral aneurysm Father   . Heart disease Father     Review of Systems  Constitutional: Negative for chills and fever.  HENT: Negative for congestion, sore throat and tinnitus.   Eyes: Negative for double vision, photophobia and pain.  Respiratory: Negative for cough, shortness of breath and wheezing.   Cardiovascular: Negative for chest pain, palpitations and orthopnea.   Gastrointestinal: Negative for heartburn, nausea and vomiting.  Genitourinary: Negative for dysuria, frequency and urgency.  Musculoskeletal: Positive for joint pain.  Neurological: Negative for dizziness, weakness and headaches.     Objective:  Physical Exam: Well nourished and well developed.  General: Alert and oriented x3, cooperative and pleasant, no acute distress.  Head: normocephalic, atraumatic, neck supple.  Eyes: EOMI.  Respiratory: breath sounds clear in all fields, no wheezing, rales, or rhonchi. Cardiovascular: Regular rate and rhythm, no murmurs, gallops or rubs.  Abdomen: non-tender to palpation and soft, normoactive bowel sounds. Musculoskeletal:  Right Hip Exam:  The range of motion: Flexion to 100 degrees, internal rotation to 0 degrees, external rotation to 0 degrees, and abduction to 10 degrees.  There is no tenderness over the greater trochanteric bursa.  There is no pain on provocative testing of the hip.   Calves soft and nontender. Motor function intact in LE. Strength 5/5 LE bilaterally. Neuro: Distal pulses 2+. Sensation to light touch intact in LE.  Imaging Review Plain radiographs demonstrate severe degenerative joint disease of the right hip. The bone quality appears to be adequate for age and reported activity level.  Assessment/Plan:  End stage arthritis, right hip  The patient history, physical examination, clinical judgement of the provider and imaging studies are consistent with end stage degenerative joint disease of the right hip and total hip arthroplasty is deemed medically necessary. The treatment options including medical management, injection therapy, arthroscopy and arthroplasty were discussed at length. The risks and benefits of total hip arthroplasty were presented and reviewed. The risks due to aseptic loosening, infection, stiffness, dislocation/subluxation, thromboembolic complications and other imponderables were discussed. The  patient acknowledged the explanation, agreed to proceed with the plan and consent was signed. Patient is being admitted for inpatient treatment for surgery, pain control, PT, OT, prophylactic antibiotics, VTE prophylaxis, progressive ambulation and ADLs and discharge planning.The patient is planning to be discharged home.   Patient's anticipated LOS is less than 2 midnights, meeting these requirements: - Younger than 62 - Lives within 1 hour of care - Has a competent adult at home to recover with post-op recover - NO history of  - Chronic pain requiring opiods  -  Diabetes  - Coronary Artery Disease  - Heart failure  - Heart attack  - Stroke  - DVT/VTE  - Cardiac arrhythmia  - Respiratory Failure/COPD  - Renal failure  - Anemia  - Advanced Liver disease  Therapy Plans: HEP Disposition: Home to a hotel with mother Planned DVT Prophylaxis: Xarelto 10 mg QD DME Needed: Gilford Rile, 3-in-1 PCP: Synthia Innocent, FNP Cardiologist: Johnny Bridge, MD (clearance received) Vascular: Wenda Overland, MD (clearance received) TXA: IV Allergies: Morphone (hallucinations), sulfa Anesthesia Concerns: None BMI: 38 Last HgbA1c: Not diabetic  [facility]  Other:  - Hx MI 2014 with stents  - Nuclear stress test obtained 11/2020 - Currently on oxycodone 10 mg BID (prescribed q6). Discussed dilaudid postoperatively.   - Patient was instructed on what medications to stop prior to surgery. - Follow-up visit in 2 weeks with Dr. Wynelle Link - Begin physical therapy following surgery - Pre-operative lab work as pre-surgical testing - Prescriptions will be provided in hospital at time of discharge  Theresa Duty, PA-C Orthopedic Surgery [facility] [place]

## 2021-01-10 ENCOUNTER — Encounter (HOSPITAL_COMMUNITY): Payer: Self-pay

## 2021-01-10 ENCOUNTER — Encounter (HOSPITAL_COMMUNITY)
Admission: RE | Admit: 2021-01-10 | Discharge: 2021-01-10 | Disposition: A | Discharge status: Home / Self Care | Payer: Medicare HMO | Source: Ambulatory Visit | Attending: Orthopedic Surgery | Admitting: Orthopedic Surgery

## 2021-01-10 ENCOUNTER — Other Ambulatory Visit: Payer: Self-pay

## 2021-01-10 DIAGNOSIS — Z01812 Encounter for preprocedural laboratory examination: Secondary | ICD-10-CM | POA: Insufficient documentation

## 2021-01-10 HISTORY — DX: Fibromyalgia: M79.7

## 2021-01-10 HISTORY — DX: Sleep apnea, unspecified: G47.30

## 2021-01-10 HISTORY — DX: Dyspnea, unspecified: R06.00

## 2021-01-10 HISTORY — DX: Other complications of anesthesia, initial encounter: T88.59XA

## 2021-01-10 HISTORY — DX: Pneumonia, unspecified organism: J18.9

## 2021-01-10 LAB — COMPREHENSIVE METABOLIC PANEL
ALT: 14 U/L (ref 0–44)
AST: 15 U/L (ref 15–41)
Albumin: 4.3 g/dL (ref 3.5–5.0)
Alkaline Phosphatase: 72 U/L (ref 38–126)
Anion gap: 8 (ref 5–15)
BUN: 13 mg/dL (ref 6–20)
CO2: 26 mmol/L (ref 22–32)
Calcium: 9.3 mg/dL (ref 8.9–10.3)
Chloride: 103 mmol/L (ref 98–111)
Creatinine, Ser: 0.9 mg/dL (ref 0.44–1.00)
GFR, Estimated: >60 mL/min (ref >60)
Glucose, Bld: 105 mg/dL — ABNORMAL HIGH (ref 70–99)
Potassium: 4.5 mmol/L (ref 3.5–5.1)
Sodium: 137 mmol/L (ref 135–145)
Total Bilirubin: 0.6 mg/dL (ref 0.3–1.2)
Total Protein: 7.7 g/dL (ref 6.5–8.1)

## 2021-01-10 LAB — PROTIME-INR
INR: 1 (ref 0.8–1.2)
Prothrombin Time: 13.2 seconds (ref 11.4–15.2)

## 2021-01-10 LAB — APTT: aPTT: 36 seconds (ref 24–36)

## 2021-01-10 LAB — SURGICAL PCR SCREEN
MRSA, PCR: NEGATIVE
Staphylococcus aureus: NEGATIVE

## 2021-01-10 NOTE — Progress Notes (Addendum)
Anesthesia Review:  PCP: Carolee Rota, NP  [place] on chart dated 01/07/21 along with OV note and labs.   CBCdeon 01/07/21 on charst.  HGBA!C-5.7 done 01/07/21 on chart BMP 01/07/21 on chart  Cardiologist : WAS folllowed by DR Bronson Ing  11/26/20 Preop clearance with Bernerd Pho, [place] on 11/16/20.  Chest x-ray :12/17/2019  EKG :11/19/20  Echo : 2019  Stress test:12/27/20  Cardiac Cath :  2019  Activity level:  Sleep Study/ CPAP : Fasting Blood Sugar :      / Checks Blood Sugar -- times a day:   Blood Thinner/ Instructions /Last Dose: ASA / Instructions/ Last Dose :

## 2021-01-14 ENCOUNTER — Other Ambulatory Visit (HOSPITAL_COMMUNITY)
Admission: RE | Admit: 2021-01-14 | Discharge: 2021-01-14 | Disposition: A | Discharge status: Home / Self Care | Payer: Medicare HMO | Source: Ambulatory Visit | Attending: Orthopedic Surgery | Admitting: Orthopedic Surgery

## 2021-01-14 DIAGNOSIS — Z20822 Contact with and (suspected) exposure to covid-19: Secondary | ICD-10-CM | POA: Diagnosis not present

## 2021-01-14 DIAGNOSIS — Z01812 Encounter for preprocedural laboratory examination: Secondary | ICD-10-CM | POA: Diagnosis not present

## 2021-01-14 LAB — SARS CORONAVIRUS 2 (TAT 6-24 HRS): SARS Coronavirus 2: NEGATIVE

## 2021-01-15 NOTE — Anesthesia Preprocedure Evaluation (Addendum)
Anesthesia Evaluation  Patient identified by MRN, date of birth, ID band Patient awake    Reviewed: Allergy & Precautions, NPO status , Patient's Chart, lab work & pertinent test results  Airway Mallampati: II  TM Distance: >3 FB     Dental  (+) Dental Advisory Given   Pulmonary asthma , Current Smoker and Patient abstained from smoking.,    breath sounds clear to auscultation       Cardiovascular hypertension, Pt. on home beta blockers and Pt. on medications + CAD, + Cardiac Stents and + Peripheral Vascular Disease   Rhythm:Regular Rate:Normal     Neuro/Psych negative neurological ROS     GI/Hepatic Neg liver ROS, GERD  ,  Endo/Other  negative endocrine ROS  Renal/GU negative Renal ROS     Musculoskeletal  (+) Arthritis , Fibromyalgia -  Abdominal   Peds  Hematology negative hematology ROS (+)   Anesthesia Other Findings   Reproductive/Obstetrics                            Lab Results  Component Value Date   WBC 8.0 12/17/2019   HGB 13.5 12/17/2019   HCT 42.2 12/17/2019   MCV 90.0 12/17/2019   PLT 141 (L) 12/17/2019   Lab Results  Component Value Date   CREATININE 0.90 01/10/2021   BUN 13 01/10/2021   NA 137 01/10/2021   K 4.5 01/10/2021   CL 103 01/10/2021   CO2 26 01/10/2021    Anesthesia Physical Anesthesia Plan  ASA: III  Anesthesia Plan: Spinal   Post-op Pain Management:    Induction:   PONV Risk Score and Plan: 1 and Propofol infusion, Ondansetron and Treatment may vary due to age or medical condition  Airway Management Planned: Natural Airway and Simple Face Mask  Additional Equipment:   Intra-op Plan:   Post-operative Plan:   Informed Consent: I have reviewed the patients History and Physical, chart, labs and discussed the procedure including the risks, benefits and alternatives for the proposed anesthesia with the patient or authorized representative  who has indicated his/her understanding and acceptance.       Plan Discussed with: CRNA  Anesthesia Plan Comments: (PAT note by Karoline Caldwell, PA-C: Follows with cardiology for history of CAD (s/p DES to LCx in 2014, cath in 07/2018 showing patent stent with mild to moderate nonobstructive disease involving the mid-LAD and RCA), carotid artery stenosis (known LICA occlusion), HTN, HLD.  She was seen by Bernerd Pho, PA-C 11/16/2020 for preop clearance.  Lexiscan Myoview was ordered.  Test was done 12/27/2020 and read as low risk.  Bernerd Pho commented on result stating, "Please let the patient know her stress test showed no diagnostic EKG changes. She did have an area of likely soft tissue artifact or a very mild blockage but no significant blockages. Pumping function of the heart was normal. Overall, a low risk study. She is cleared from a cardiac perspective to proceed with her upcoming hip surgery."  Patient has known left ICA occlusion and moderate stenosis on the right. This is followed by vascular surgery at [facility].  She has no history of CVA or TIA.  Last seen 11/26/2020.  Per note, "-Duplex results are stable with moderate stenosis on the right and known occludion on the left.  -Given that the duplex is consistent with <80% stenosis, I have recommended surveillance with appropriate pharmacotherapy for cardiovascular risk reduction as long as she remains asymptomatic. Long discussion about  why intervention on her occlusion is not only unnecessary, but would increase her risk of stroke."  17-month follow-up recommended.  EKG 11/19/2020: Sinus bradycardia.  Rate 59.  Low voltage QRS.  Nuclear stress 12/27/2020: No diagnostic ST segment changes to indicate ischemia. Small, mild intensity, mid to apical anteroseptal defect that is partially reversible suggestive of either variable soft tissue attenuation or a mild ischemic territory. This is a low risk study. Nuclear stress EF:  69%.  Echocardiogram: 02/2018 Study Conclusions   - Procedure narrative: Transthoracic echocardiography. Image  quality was suboptimal. Contrast enhancement was employed.  - Left ventricle: The cavity size was normal. Wall thickness was  increased in a pattern of mild LVH. Systolic function was normal.  The estimated ejection fraction was in the range of 55% to 60%.  Wall motion was normal; there were no regional wall motion  abnormalities. Left ventricular diastolic function parameters  were normal.   Cardiac Catheterization: 07/2018 Conclusions: Mild to moderate, non-obstructive coronary artery disease involving the mid LAD and RCA. Widely patent proximal/mid LCx stent. Normal left ventricular contraction with mildly elevated filling pressure.  Recommendations: Medical therapy and aggressive secondary prevention, including smoking cessation, weight loss, and lipid control. Consider increasing statin therapy if LDL not at goal (<70). Start furosemide 20 mg daily for component of diastolic heart failure.  Recommend Aspirin 81mg  daily for moderate CAD. )      Anesthesia Quick Evaluation

## 2021-01-15 NOTE — Progress Notes (Signed)
Anesthesia Chart Review:  Follows with cardiology for history of CAD (s/p DES to LCx in 2014, cath in 07/2018 showing patent stent with mild to moderate nonobstructive disease involving the mid-LAD and RCA), carotid artery stenosis (known LICA occlusion), HTN, HLD.  She was seen by Bernerd Pho, PA-C 11/16/2020 for preop clearance.  Lexiscan Myoview was ordered.  Test was done 12/27/2020 and read as low risk.  Bernerd Pho commented on result stating, "Please let the patient know her stress test showed no diagnostic EKG changes. She did have an area of likely soft tissue artifact or a very mild blockage but no significant blockages. Pumping function of the heart was normal. Overall, a low risk study. She is cleared from a cardiac perspective to proceed with her upcoming hip surgery."  Patient has known left ICA occlusion and moderate stenosis on the right. This is followed by vascular surgery at [facility].  She has no history of CVA or TIA.  Last seen 11/26/2020.  Per note, "-Duplex results are stable with moderate stenosis on the right and known occludion on the left.  -Given that the duplex is consistent with <80% stenosis, I have recommended surveillance with appropriate pharmacotherapy for cardiovascular risk reduction as long as she remains asymptomatic. Long discussion about why intervention on her occlusion is not only unnecessary, but would increase her risk of stroke."  39-month follow-up recommended.  EKG 11/19/2020: Sinus bradycardia.  Rate 59.  Low voltage QRS.  Nuclear stress 12/27/2020:  No diagnostic ST segment changes to indicate ischemia.  Small, mild intensity, mid to apical anteroseptal defect that is partially reversible suggestive of either variable soft tissue attenuation or a mild ischemic territory.  This is a low risk study.  Nuclear stress EF: 69%.  Echocardiogram: 02/2018 Study Conclusions   - Procedure narrative: Transthoracic echocardiography. Image  quality was  suboptimal. Contrast enhancement was employed.  - Left ventricle: The cavity size was normal. Wall thickness was  increased in a pattern of mild LVH. Systolic function was normal.  The estimated ejection fraction was in the range of 55% to 60%.  Wall motion was normal; there were no regional wall motion  abnormalities. Left ventricular diastolic function parameters  were normal.   Cardiac Catheterization: 07/2018 Conclusions: 1. Mild to moderate, non-obstructive coronary artery disease involving the mid LAD and RCA. 2. Widely patent proximal/mid LCx stent. 3. Normal left ventricular contraction with mildly elevated filling pressure.  Recommendations: 1. Medical therapy and aggressive secondary prevention, including smoking cessation, weight loss, and lipid control. Consider increasing statin therapy if LDL not at goal (<70). 2. Start furosemide 20 mg daily for component of diastolic heart failure.  Recommend Aspirin 81mg  daily for moderate CAD.   [place], [facility] Short Stay Center/Anesthesiology Phone [phone] 01/15/2021 10:25 AM

## 2021-01-16 ENCOUNTER — Observation Stay (HOSPITAL_COMMUNITY): Payer: Medicare HMO

## 2021-01-16 ENCOUNTER — Other Ambulatory Visit: Payer: Self-pay

## 2021-01-16 ENCOUNTER — Ambulatory Visit (HOSPITAL_COMMUNITY): Payer: Medicare HMO

## 2021-01-16 ENCOUNTER — Observation Stay (HOSPITAL_COMMUNITY)
Admission: RE | Admit: 2021-01-16 | Discharge: 2021-01-17 | Disposition: A | Discharge status: Home / Self Care | Payer: Medicare HMO | Attending: Orthopedic Surgery | Admitting: Orthopedic Surgery

## 2021-01-16 ENCOUNTER — Ambulatory Visit (HOSPITAL_COMMUNITY): Payer: Medicare HMO | Admitting: Anesthesiology

## 2021-01-16 ENCOUNTER — Ambulatory Visit (HOSPITAL_COMMUNITY): Payer: Medicare HMO | Admitting: Physician Assistant

## 2021-01-16 ENCOUNTER — Encounter (HOSPITAL_COMMUNITY): Payer: Self-pay | Admitting: Orthopedic Surgery

## 2021-01-16 ENCOUNTER — Encounter (HOSPITAL_COMMUNITY)
Admission: RE | Disposition: A | Discharge status: Home / Self Care | Payer: Self-pay | Source: Home / Self Care | Attending: Orthopedic Surgery

## 2021-01-16 DIAGNOSIS — J449 Chronic obstructive pulmonary disease, unspecified: Secondary | ICD-10-CM | POA: Insufficient documentation

## 2021-01-16 DIAGNOSIS — Z471 Aftercare following joint replacement surgery: Secondary | ICD-10-CM | POA: Diagnosis not present

## 2021-01-16 DIAGNOSIS — J45909 Unspecified asthma, uncomplicated: Secondary | ICD-10-CM | POA: Insufficient documentation

## 2021-01-16 DIAGNOSIS — Z79899 Other long term (current) drug therapy: Secondary | ICD-10-CM | POA: Diagnosis not present

## 2021-01-16 DIAGNOSIS — Z96649 Presence of unspecified artificial hip joint: Secondary | ICD-10-CM

## 2021-01-16 DIAGNOSIS — Z85828 Personal history of other malignant neoplasm of skin: Secondary | ICD-10-CM | POA: Insufficient documentation

## 2021-01-16 DIAGNOSIS — I5032 Chronic diastolic (congestive) heart failure: Secondary | ICD-10-CM | POA: Diagnosis not present

## 2021-01-16 DIAGNOSIS — Z7982 Long term (current) use of aspirin: Secondary | ICD-10-CM | POA: Diagnosis not present

## 2021-01-16 DIAGNOSIS — Z96641 Presence of right artificial hip joint: Secondary | ICD-10-CM | POA: Diagnosis not present

## 2021-01-16 DIAGNOSIS — I110 Hypertensive heart disease with heart failure: Secondary | ICD-10-CM | POA: Diagnosis not present

## 2021-01-16 DIAGNOSIS — M1611 Unilateral primary osteoarthritis, right hip: Principal | ICD-10-CM | POA: Diagnosis present

## 2021-01-16 DIAGNOSIS — F17210 Nicotine dependence, cigarettes, uncomplicated: Secondary | ICD-10-CM | POA: Diagnosis not present

## 2021-01-16 DIAGNOSIS — E785 Hyperlipidemia, unspecified: Secondary | ICD-10-CM | POA: Diagnosis not present

## 2021-01-16 DIAGNOSIS — I2510 Atherosclerotic heart disease of native coronary artery without angina pectoris: Secondary | ICD-10-CM | POA: Insufficient documentation

## 2021-01-16 HISTORY — PX: TOTAL HIP ARTHROPLASTY: SHX124

## 2021-01-16 LAB — TYPE AND SCREEN
ABO/RH(D): A POS
Antibody Screen: NEGATIVE

## 2021-01-16 IMAGING — RF DG HIP (WITH PELVIS) OPERATIVE*R*
1 series · 2 of 2 positions shown · non-contrast
Comparison: No recent.

CLINICAL DATA: Hip replacement.

EXAM:
OPERATIVE RIGHT HIP (WITH PELVIS IF PERFORMED) 2 VIEWS
TECHNIQUE: Fluoroscopic spot image(s) were submitted for interpretation
post-operatively.

[Series 1: unknown protocol · 0.20mm/px · 2 of 2 slices shown]
[im 1/2]
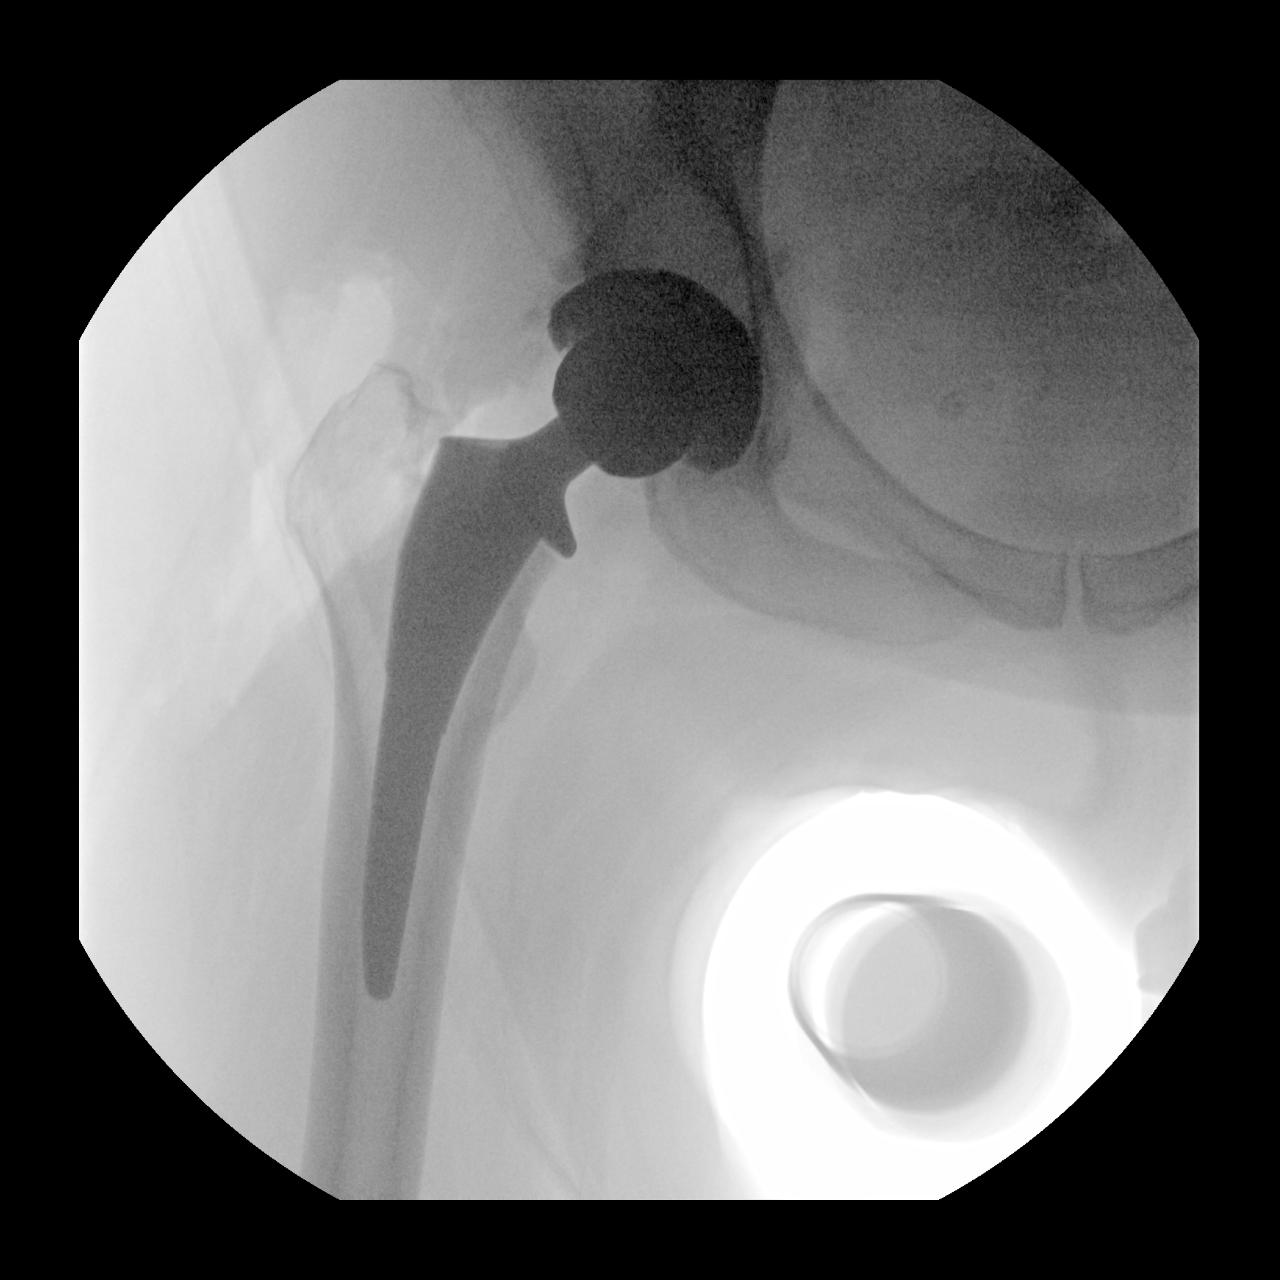
[im 2/2]
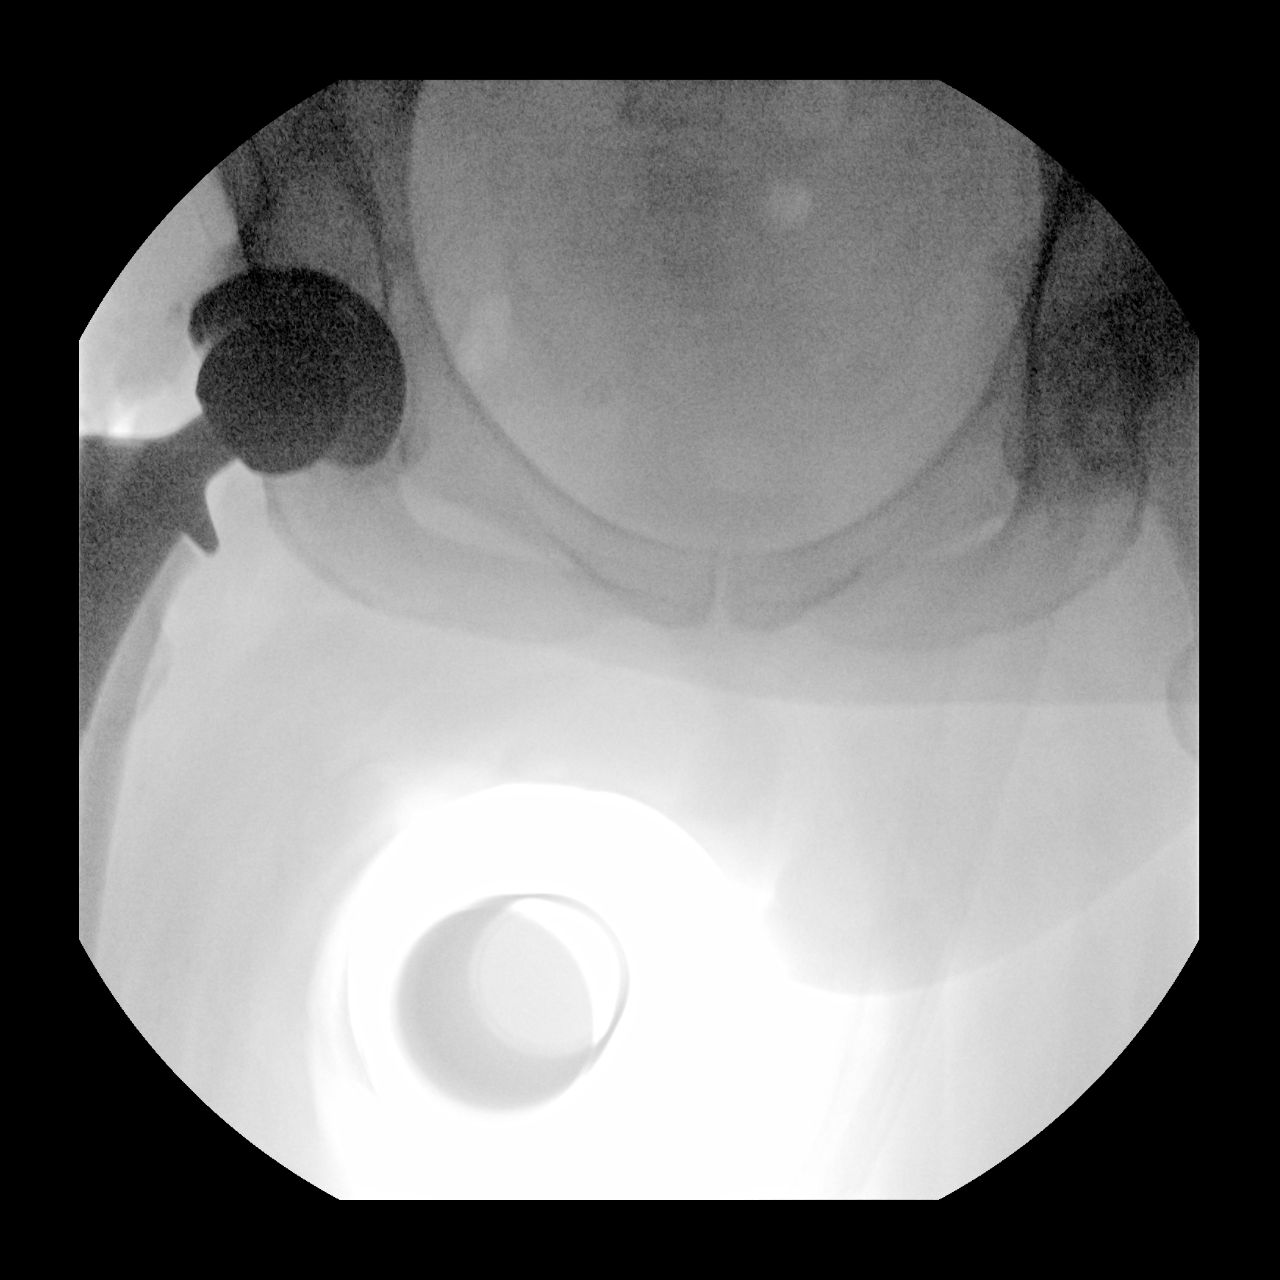

[2 of 2 positions shown; findings below may reference images not displayed]

FINDINGS: Total right hip replacement. Hardware intact. Anatomic alignment. 0
minutes 7 seconds fluoroscopy. 2.2 mGy radiation dose.
IMPRESSION: Total right hip replacement.  Hardware intact.  Anatomic alignment.

## 2021-01-16 IMAGING — DX DG PORTABLE PELVIS
1 series · 1 of 1 positions shown · non-contrast
Comparison: None.

CLINICAL DATA: Status post right hip arthroplasty.

EXAM:
PORTABLE PELVIS 1-2 VIEWS

[pelvis ap]
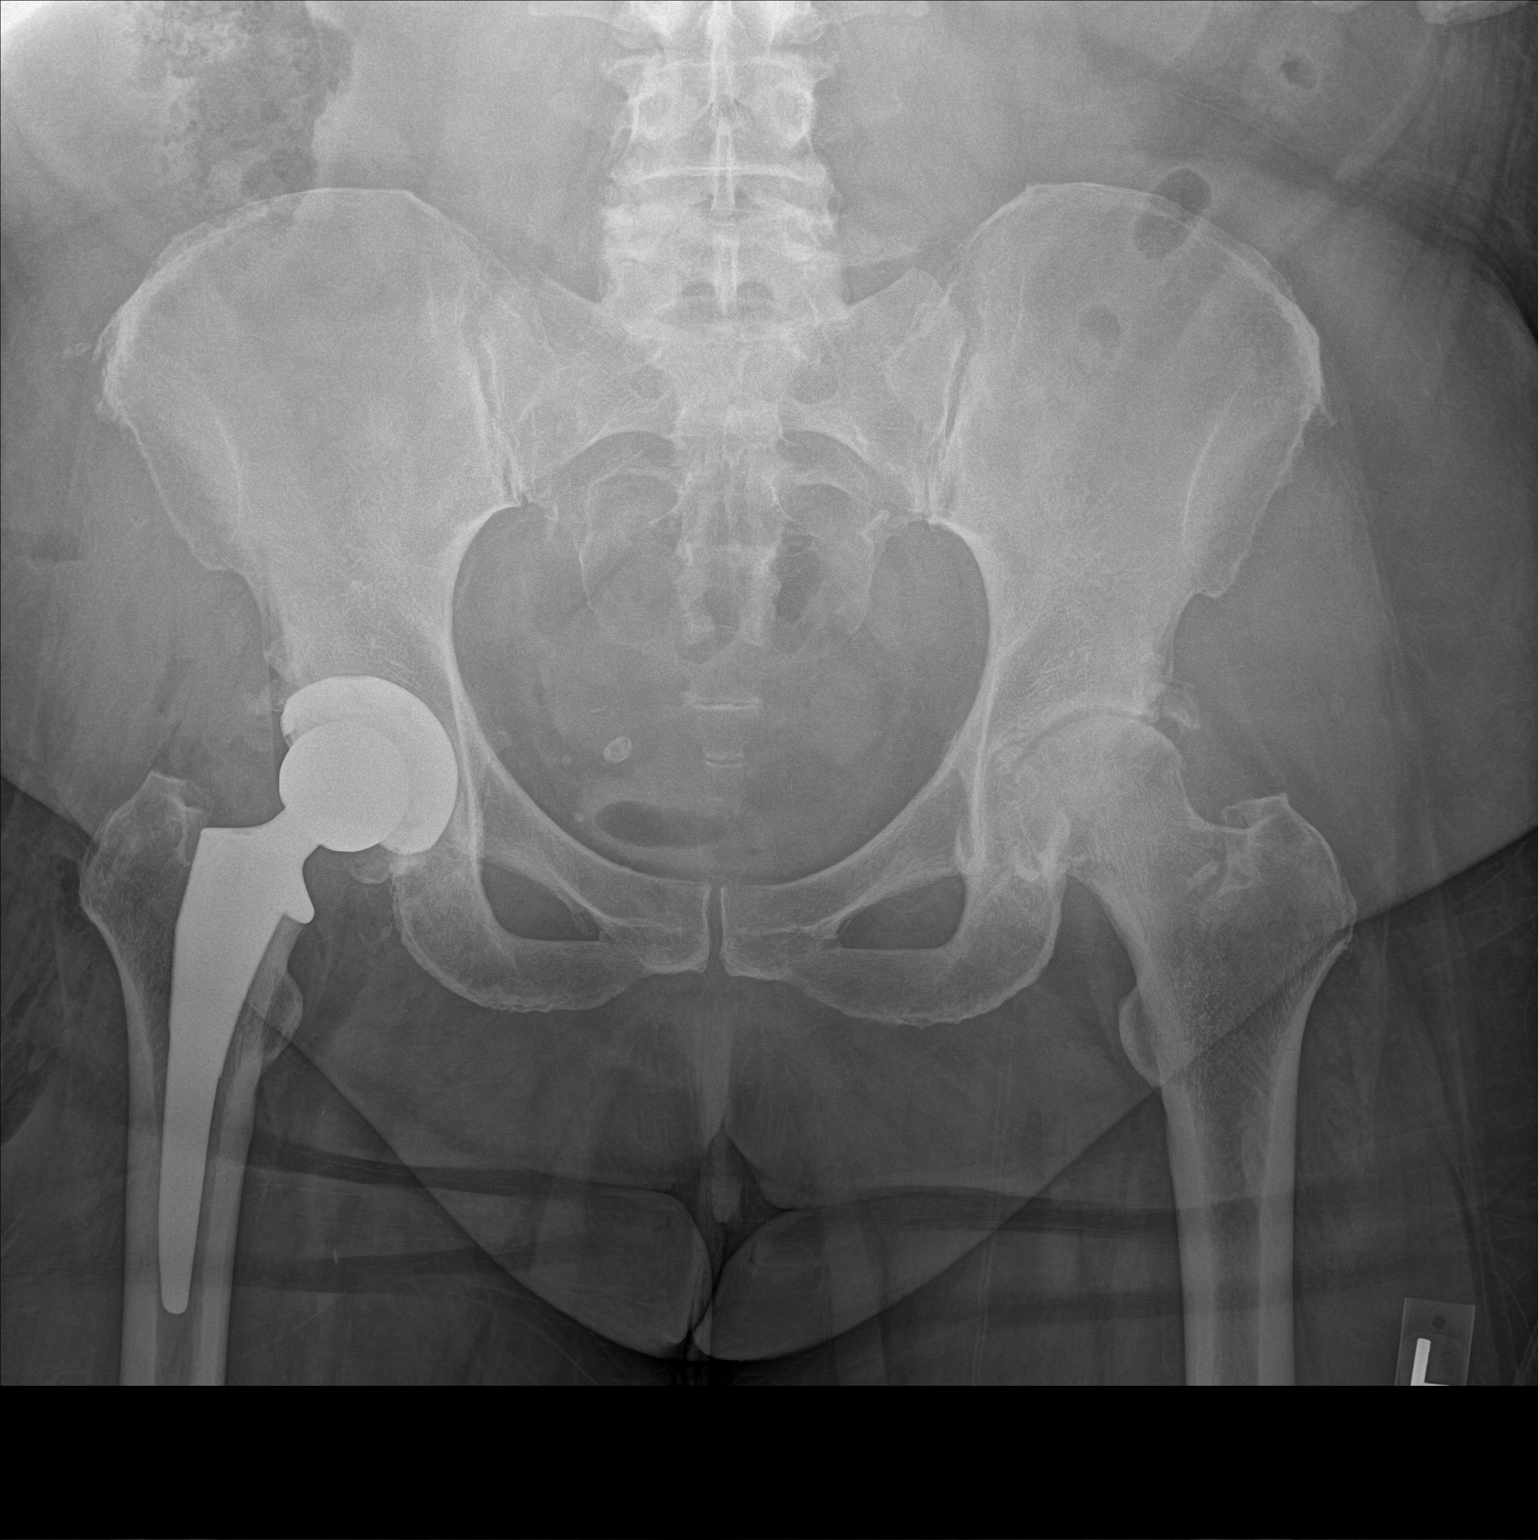

[1 of 1 positions shown; findings below may reference images not displayed]

FINDINGS: The right femoral and acetabular components are well situated.
Expected postoperative changes are seen in the surrounding soft
tissues.
IMPRESSION: Status post right total hip arthroplasty.

## 2021-01-16 IMAGING — RF DG C-ARM 1-60 MIN-NO REPORT
1 series · 2 of 2 positions shown · non-contrast
Comparison: No recent.

CLINICAL DATA: Hip replacement.

EXAM:
OPERATIVE RIGHT HIP (WITH PELVIS IF PERFORMED) 2 VIEWS
TECHNIQUE: Fluoroscopic spot image(s) were submitted for interpretation
post-operatively.

[Series 1: unknown protocol · 0.20mm/px · 2 of 2 slices shown]
[im 1/2]
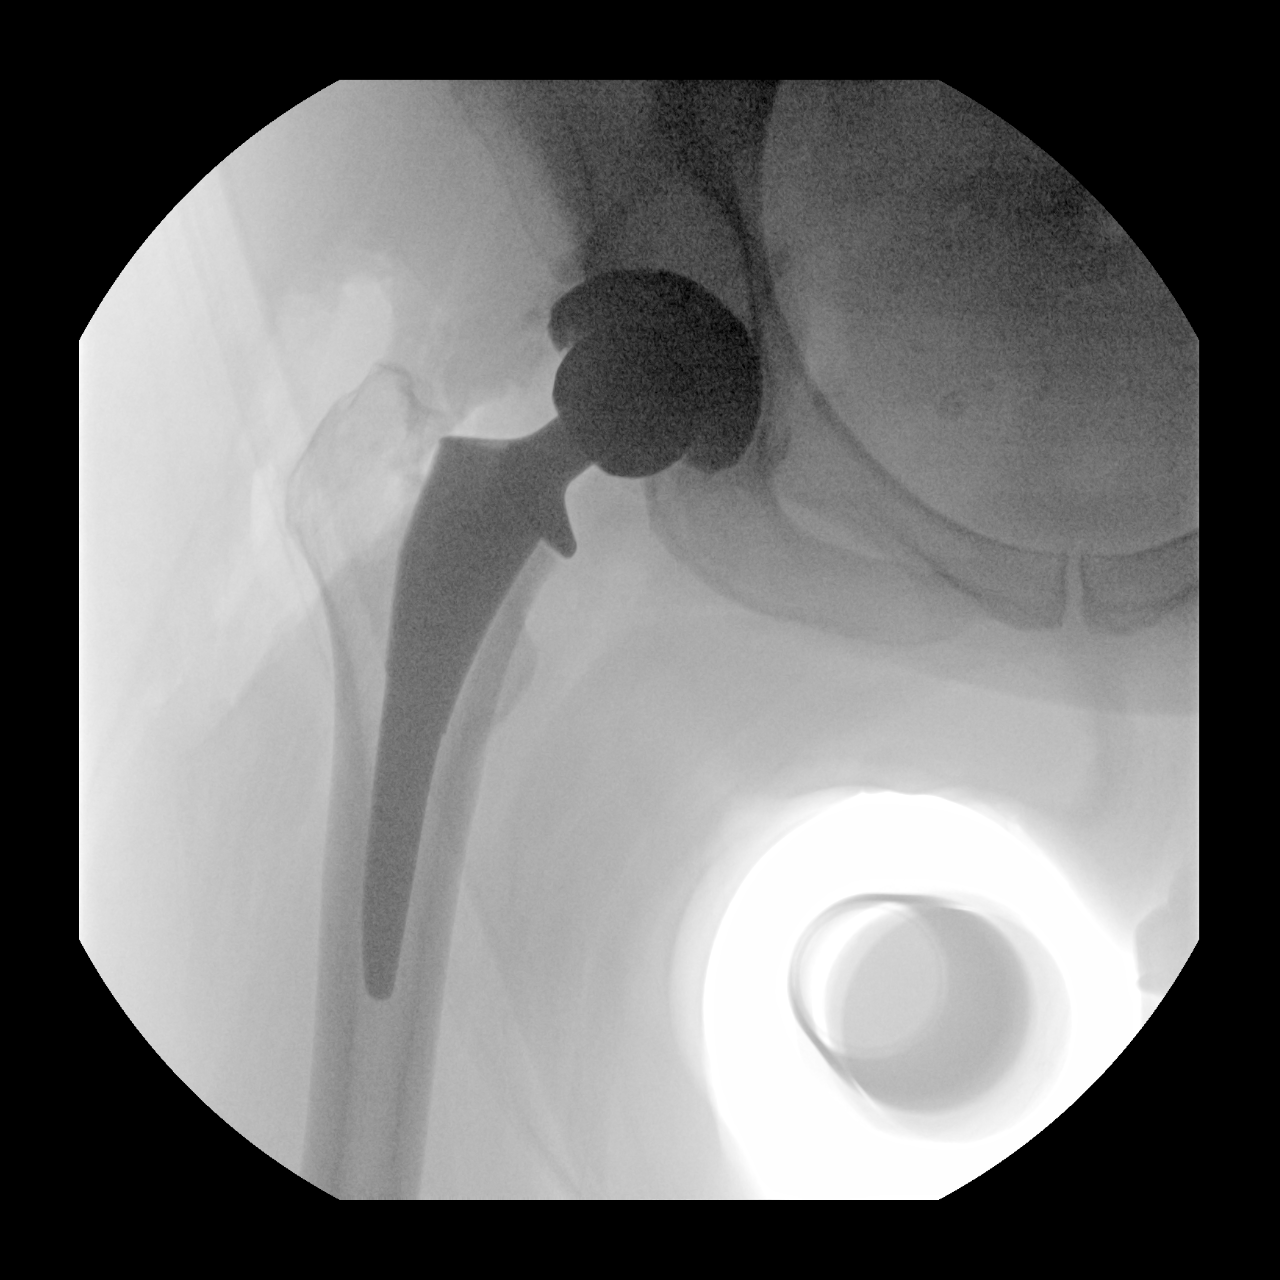
[im 2/2]
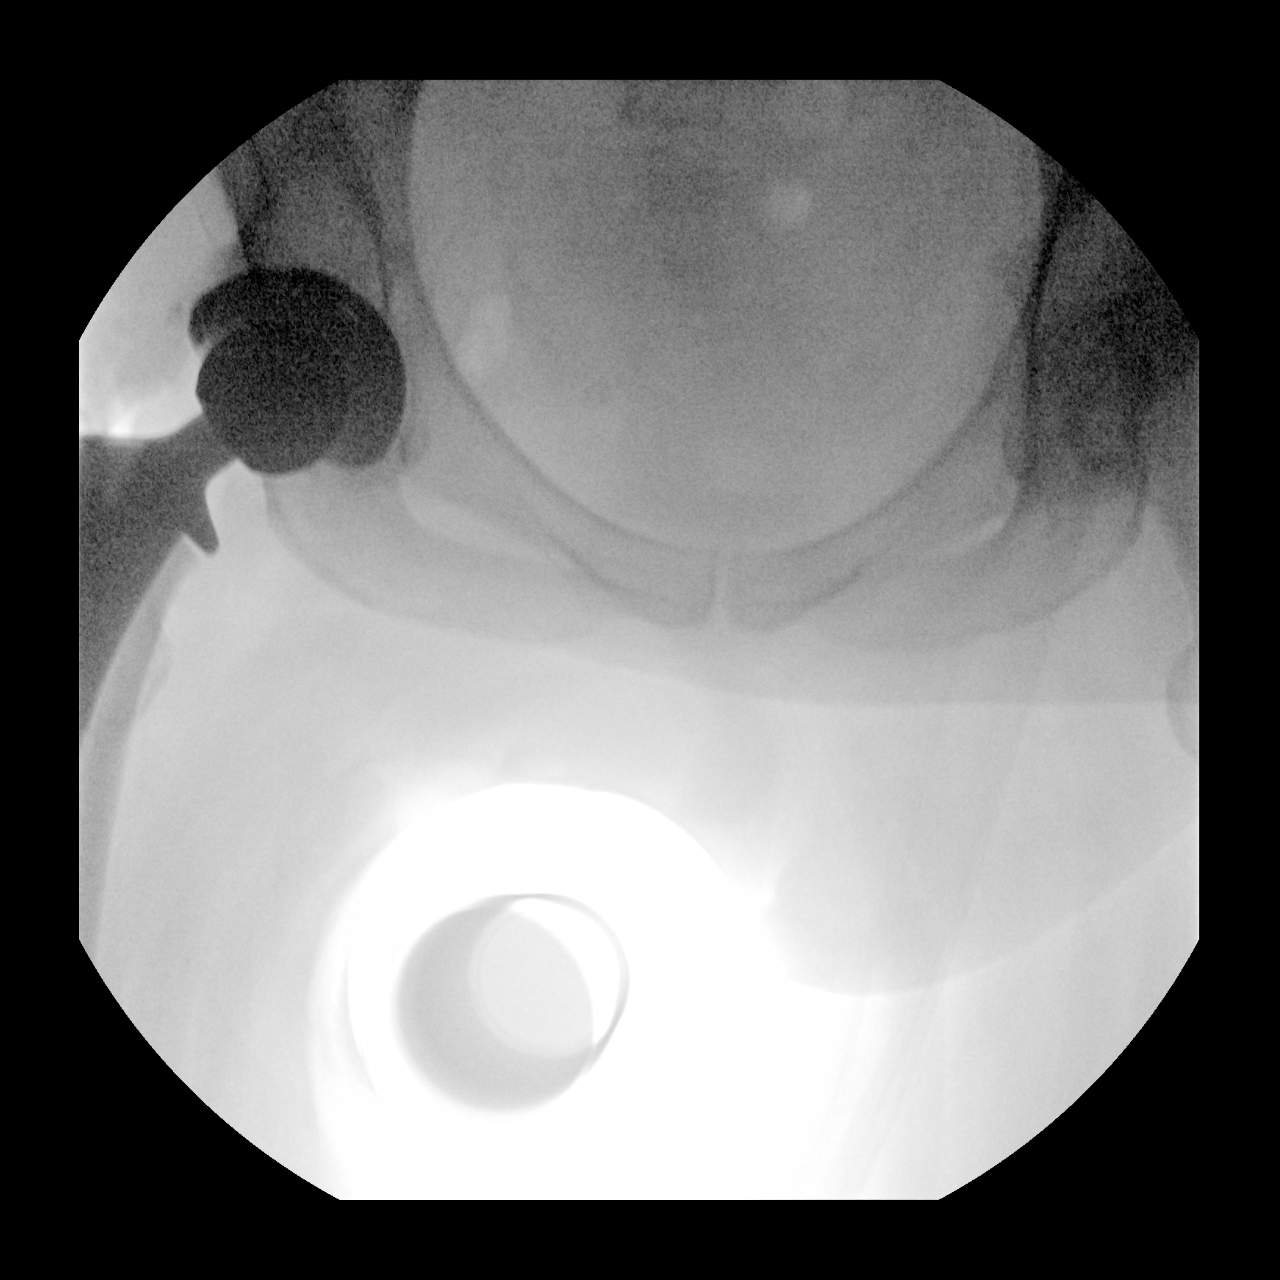

[2 of 2 positions shown; findings below may reference images not displayed]

FINDINGS: Total right hip replacement. Hardware intact. Anatomic alignment. 0
minutes 7 seconds fluoroscopy. 2.2 mGy radiation dose.
IMPRESSION: Total right hip replacement.  Hardware intact.  Anatomic alignment.

## 2021-01-16 SURGERY — ARTHROPLASTY, HIP, TOTAL, ANTERIOR APPROACH
Anesthesia: Spinal | Site: Hip | Laterality: Right

## 2021-01-16 MED ORDER — METHOCARBAMOL 500 MG PO TABS
500.0000 mg | ORAL_TABLET | Freq: Four times a day (QID) | ORAL | Status: DC | PRN
  Administered 2021-01-16 – 2021-01-17 (×3): 500 mg via ORAL
  Filled 2021-01-16 (×3): qty 1

## 2021-01-16 MED ORDER — PHENYLEPHRINE 40 MCG/ML (10ML) SYRINGE FOR IV PUSH (FOR BLOOD PRESSURE SUPPORT)
PREFILLED_SYRINGE | INTRAVENOUS | Status: AC
  Filled 2021-01-16: qty 10

## 2021-01-16 MED ORDER — CEFAZOLIN SODIUM-DEXTROSE 2-4 GM/100ML-% IV SOLN
2.0000 g | Freq: Four times a day (QID) | INTRAVENOUS | Status: AC
  Administered 2021-01-16 (×2): 2 g via INTRAVENOUS
  Filled 2021-01-16 (×2): qty 100

## 2021-01-16 MED ORDER — DOCUSATE SODIUM 100 MG PO CAPS
100.0000 mg | ORAL_CAPSULE | Freq: Two times a day (BID) | ORAL | Status: DC
  Administered 2021-01-16 – 2021-01-17 (×3): 100 mg via ORAL
  Filled 2021-01-16 (×3): qty 1

## 2021-01-16 MED ORDER — PANTOPRAZOLE SODIUM 20 MG PO TBEC
20.0000 mg | DELAYED_RELEASE_TABLET | Freq: Two times a day (BID) | ORAL | Status: DC | PRN
  Filled 2021-01-16: qty 1

## 2021-01-16 MED ORDER — ALBUTEROL SULFATE HFA 108 (90 BASE) MCG/ACT IN AERS: 2.0000 | INHALATION_SPRAY | RESPIRATORY_TRACT | Status: DC | PRN

## 2021-01-16 MED ORDER — BISACODYL 10 MG RE SUPP: 10.0000 mg | Freq: Every day | RECTAL | Status: DC | PRN

## 2021-01-16 MED ORDER — 0.9 % SODIUM CHLORIDE (POUR BTL) OPTIME
TOPICAL | Status: DC | PRN
  Administered 2021-01-16: 1000 mL

## 2021-01-16 MED ORDER — RIVAROXABAN 10 MG PO TABS
10.0000 mg | ORAL_TABLET | Freq: Every day | ORAL | Status: DC
  Administered 2021-01-17: 10 mg via ORAL
  Filled 2021-01-16: qty 1

## 2021-01-16 MED ORDER — LACTATED RINGERS IV SOLN: INTRAVENOUS | Status: DC

## 2021-01-16 MED ORDER — PROPOFOL 10 MG/ML IV BOLUS
INTRAVENOUS | Status: DC | PRN
  Administered 2021-01-16 (×3): 20 mg via INTRAVENOUS

## 2021-01-16 MED ORDER — ONDANSETRON HCL 4 MG PO TABS: 4.0000 mg | ORAL_TABLET | Freq: Four times a day (QID) | ORAL | Status: DC | PRN

## 2021-01-16 MED ORDER — POVIDONE-IODINE 10 % EX SWAB
2.0000 "application " | Freq: Once | CUTANEOUS | Status: AC
  Administered 2021-01-16: 2 via TOPICAL

## 2021-01-16 MED ORDER — CHLORHEXIDINE GLUCONATE 0.12 % MT SOLN
15.0000 mL | Freq: Once | OROMUCOSAL | Status: AC
  Administered 2021-01-16: 15 mL via OROMUCOSAL

## 2021-01-16 MED ORDER — PROPOFOL 1000 MG/100ML IV EMUL
INTRAVENOUS | Status: AC
  Filled 2021-01-16: qty 100

## 2021-01-16 MED ORDER — TRANEXAMIC ACID-NACL 1000-0.7 MG/100ML-% IV SOLN
1000.0000 mg | INTRAVENOUS | Status: AC
  Administered 2021-01-16: 1000 mg via INTRAVENOUS
  Filled 2021-01-16: qty 100

## 2021-01-16 MED ORDER — FENTANYL CITRATE (PF) 100 MCG/2ML IJ SOLN
INTRAMUSCULAR | Status: AC
  Filled 2021-01-16: qty 2

## 2021-01-16 MED ORDER — AMISULPRIDE (ANTIEMETIC) 5 MG/2ML IV SOLN: 10.0000 mg | Freq: Once | INTRAVENOUS | Status: DC | PRN

## 2021-01-16 MED ORDER — HYDROMORPHONE HCL 1 MG/ML IJ SOLN
0.5000 mg | INTRAMUSCULAR | Status: DC | PRN
  Administered 2021-01-16 (×2): 1 mg via INTRAVENOUS
  Filled 2021-01-16 (×2): qty 1

## 2021-01-16 MED ORDER — PHENYLEPHRINE HCL-NACL 10-0.9 MG/250ML-% IV SOLN
INTRAVENOUS | Status: AC
  Filled 2021-01-16: qty 250

## 2021-01-16 MED ORDER — ROSUVASTATIN CALCIUM 20 MG PO TABS
20.0000 mg | ORAL_TABLET | Freq: Every day | ORAL | Status: DC
  Administered 2021-01-16: 20 mg via ORAL
  Filled 2021-01-16: qty 1

## 2021-01-16 MED ORDER — SODIUM CHLORIDE 0.9 % IV SOLN: INTRAVENOUS | Status: DC

## 2021-01-16 MED ORDER — ACETAMINOPHEN 10 MG/ML IV SOLN
1000.0000 mg | Freq: Four times a day (QID) | INTRAVENOUS | Status: DC
  Administered 2021-01-16: 1000 mg via INTRAVENOUS
  Filled 2021-01-16: qty 100

## 2021-01-16 MED ORDER — CEFAZOLIN SODIUM-DEXTROSE 2-4 GM/100ML-% IV SOLN
2.0000 g | INTRAVENOUS | Status: AC
  Administered 2021-01-16: 2 g via INTRAVENOUS
  Filled 2021-01-16: qty 100

## 2021-01-16 MED ORDER — METHOCARBAMOL 500 MG IVPB - SIMPLE MED
INTRAVENOUS | Status: AC
  Filled 2021-01-16: qty 50

## 2021-01-16 MED ORDER — ONDANSETRON HCL 4 MG/2ML IJ SOLN
INTRAMUSCULAR | Status: DC | PRN
  Administered 2021-01-16: 4 mg via INTRAVENOUS

## 2021-01-16 MED ORDER — DEXAMETHASONE SODIUM PHOSPHATE 10 MG/ML IJ SOLN
INTRAMUSCULAR | Status: AC
  Filled 2021-01-16: qty 1

## 2021-01-16 MED ORDER — ESCITALOPRAM OXALATE 20 MG PO TABS
20.0000 mg | ORAL_TABLET | Freq: Every day | ORAL | Status: DC
  Administered 2021-01-16 – 2021-01-17 (×2): 20 mg via ORAL
  Filled 2021-01-16 (×2): qty 1

## 2021-01-16 MED ORDER — MAGNESIUM CITRATE PO SOLN: 1.0000 | Freq: Once | ORAL | Status: DC | PRN

## 2021-01-16 MED ORDER — BUPIVACAINE IN DEXTROSE 0.75-8.25 % IT SOLN
INTRATHECAL | Status: DC | PRN
  Administered 2021-01-16: 1.8 mL via INTRATHECAL

## 2021-01-16 MED ORDER — METOCLOPRAMIDE HCL 5 MG/ML IJ SOLN: 5.0000 mg | Freq: Three times a day (TID) | INTRAMUSCULAR | Status: DC | PRN

## 2021-01-16 MED ORDER — BISOPROLOL-HYDROCHLOROTHIAZIDE 5-6.25 MG PO TABS
1.0000 | ORAL_TABLET | Freq: Every day | ORAL | Status: DC
  Administered 2021-01-16 – 2021-01-17 (×2): 1 via ORAL
  Filled 2021-01-16 (×2): qty 1

## 2021-01-16 MED ORDER — PHENYLEPHRINE HCL-NACL 10-0.9 MG/250ML-% IV SOLN
INTRAVENOUS | Status: DC | PRN
  Administered 2021-01-16: 50 ug/min via INTRAVENOUS

## 2021-01-16 MED ORDER — PHENOL 1.4 % MT LIQD: 1.0000 | OROMUCOSAL | Status: DC | PRN

## 2021-01-16 MED ORDER — ONDANSETRON HCL 4 MG/2ML IJ SOLN: 4.0000 mg | Freq: Four times a day (QID) | INTRAMUSCULAR | Status: DC | PRN

## 2021-01-16 MED ORDER — DEXAMETHASONE SODIUM PHOSPHATE 10 MG/ML IJ SOLN
10.0000 mg | Freq: Once | INTRAMUSCULAR | Status: AC
Start: 2021-01-17 — End: 2021-01-17
  Administered 2021-01-17: 10 mg via INTRAVENOUS
  Filled 2021-01-16: qty 1

## 2021-01-16 MED ORDER — ORAL CARE MOUTH RINSE: 15.0000 mL | Freq: Once | OROMUCOSAL | Status: AC

## 2021-01-16 MED ORDER — ONDANSETRON HCL 4 MG/2ML IJ SOLN
INTRAMUSCULAR | Status: AC
  Filled 2021-01-16: qty 2

## 2021-01-16 MED ORDER — NITROGLYCERIN 0.4 MG SL SUBL: 0.4000 mg | SUBLINGUAL_TABLET | SUBLINGUAL | Status: DC | PRN

## 2021-01-16 MED ORDER — PHENYLEPHRINE 40 MCG/ML (10ML) SYRINGE FOR IV PUSH (FOR BLOOD PRESSURE SUPPORT)
PREFILLED_SYRINGE | INTRAVENOUS | Status: DC | PRN
  Administered 2021-01-16: 80 ug via INTRAVENOUS

## 2021-01-16 MED ORDER — BUPIVACAINE HCL (PF) 0.25 % IJ SOLN
INTRAMUSCULAR | Status: AC
  Filled 2021-01-16: qty 30

## 2021-01-16 MED ORDER — PROPOFOL 10 MG/ML IV BOLUS
INTRAVENOUS | Status: AC
  Filled 2021-01-16: qty 20

## 2021-01-16 MED ORDER — MENTHOL 3 MG MT LOZG: 1.0000 | LOZENGE | OROMUCOSAL | Status: DC | PRN

## 2021-01-16 MED ORDER — BUPIVACAINE HCL 0.25 % IJ SOLN
INTRAMUSCULAR | Status: DC | PRN
  Administered 2021-01-16: 30 mL

## 2021-01-16 MED ORDER — HYDROMORPHONE HCL 2 MG PO TABS
2.0000 mg | ORAL_TABLET | ORAL | Status: DC | PRN
  Administered 2021-01-16 – 2021-01-17 (×4): 4 mg via ORAL
  Filled 2021-01-16 (×4): qty 2

## 2021-01-16 MED ORDER — ACETAMINOPHEN 500 MG PO TABS
500.0000 mg | ORAL_TABLET | Freq: Four times a day (QID) | ORAL | Status: AC
  Administered 2021-01-16 – 2021-01-17 (×4): 500 mg via ORAL
  Filled 2021-01-16 (×4): qty 1

## 2021-01-16 MED ORDER — MIDAZOLAM HCL 2 MG/2ML IJ SOLN
INTRAMUSCULAR | Status: AC
  Filled 2021-01-16: qty 2

## 2021-01-16 MED ORDER — METOCLOPRAMIDE HCL 5 MG PO TABS
5.0000 mg | ORAL_TABLET | Freq: Three times a day (TID) | ORAL | Status: DC | PRN
Start: 2021-01-16 — End: 2021-01-17

## 2021-01-16 MED ORDER — WATER FOR IRRIGATION, STERILE IR SOLN
Status: DC | PRN
  Administered 2021-01-16 (×2): 1000 mL

## 2021-01-16 MED ORDER — FENTANYL CITRATE (PF) 100 MCG/2ML IJ SOLN: 25.0000 ug | INTRAMUSCULAR | Status: DC | PRN

## 2021-01-16 MED ORDER — POLYETHYLENE GLYCOL 3350 17 G PO PACK: 17.0000 g | PACK | Freq: Every day | ORAL | Status: DC | PRN

## 2021-01-16 MED ORDER — LIDOCAINE 2% (20 MG/ML) 5 ML SYRINGE
INTRAMUSCULAR | Status: AC
  Filled 2021-01-16: qty 5

## 2021-01-16 MED ORDER — LIDOCAINE 2% (20 MG/ML) 5 ML SYRINGE
INTRAMUSCULAR | Status: DC | PRN
  Administered 2021-01-16: 40 mg via INTRAVENOUS

## 2021-01-16 MED ORDER — METHOCARBAMOL 500 MG IVPB - SIMPLE MED
500.0000 mg | Freq: Four times a day (QID) | INTRAVENOUS | Status: DC | PRN
  Administered 2021-01-16: 500 mg via INTRAVENOUS
  Filled 2021-01-16: qty 50

## 2021-01-16 MED ORDER — MIDAZOLAM HCL 5 MG/5ML IJ SOLN
INTRAMUSCULAR | Status: DC | PRN
  Administered 2021-01-16: 2 mg via INTRAVENOUS

## 2021-01-16 MED ORDER — ALPRAZOLAM 1 MG PO TABS
1.0000 mg | ORAL_TABLET | Freq: Two times a day (BID) | ORAL | Status: DC | PRN
  Administered 2021-01-16: 1 mg via ORAL
  Filled 2021-01-16: qty 1

## 2021-01-16 MED ORDER — PROPOFOL 500 MG/50ML IV EMUL
INTRAVENOUS | Status: DC | PRN
  Administered 2021-01-16: 75 ug/kg/min via INTRAVENOUS

## 2021-01-16 MED ORDER — EPHEDRINE SULFATE-NACL 50-0.9 MG/10ML-% IV SOSY
PREFILLED_SYRINGE | INTRAVENOUS | Status: DC | PRN
  Administered 2021-01-16 (×2): 15 mg via INTRAVENOUS

## 2021-01-16 MED ORDER — DEXAMETHASONE SODIUM PHOSPHATE 10 MG/ML IJ SOLN
8.0000 mg | Freq: Once | INTRAMUSCULAR | Status: AC
  Administered 2021-01-16: 8 mg via INTRAVENOUS

## 2021-01-16 MED ORDER — EPHEDRINE 5 MG/ML INJ
INTRAVENOUS | Status: AC
  Filled 2021-01-16: qty 10

## 2021-01-16 MED ORDER — FENTANYL CITRATE (PF) 100 MCG/2ML IJ SOLN
INTRAMUSCULAR | Status: DC | PRN
  Administered 2021-01-16 (×2): 50 ug via INTRAVENOUS

## 2021-01-16 SURGICAL SUPPLY — 43 items
BAG DECANTER FOR FLEXI CONT (MISCELLANEOUS) IMPLANT
BAG SPEC THK2 15X12 ZIP CLS (MISCELLANEOUS)
BAG ZIPLOCK 12X15 (MISCELLANEOUS) IMPLANT
BALL HIP CERAMIC (Hips) IMPLANT
BLADE SAG 18X100X1.27 (BLADE) ×2 IMPLANT
COVER PERINEAL POST (MISCELLANEOUS) ×2 IMPLANT
COVER SURGICAL LIGHT HANDLE (MISCELLANEOUS) ×2 IMPLANT
COVER WAND RF STERILE (DRAPES) IMPLANT
CUP ACET PINNACLE SECTR 50MM (Hips) IMPLANT
DECANTER SPIKE VIAL GLASS SM (MISCELLANEOUS) ×2 IMPLANT
DRAPE STERI IOBAN 125X83 (DRAPES) ×2 IMPLANT
DRAPE U-SHAPE 47X51 STRL (DRAPES) ×4 IMPLANT
DRSG AQUACEL AG ADV 3.5X10 (GAUZE/BANDAGES/DRESSINGS) ×2 IMPLANT
DURAPREP 26ML APPLICATOR (WOUND CARE) ×2 IMPLANT
ELECT REM PT RETURN 15FT ADLT (MISCELLANEOUS) ×2 IMPLANT
GLOVE SRG 8 PF TXTR STRL LF DI (GLOVE) ×1 IMPLANT
GLOVE SURG ENC MOIS LTX SZ6.5 (GLOVE) ×2 IMPLANT
GLOVE SURG ENC MOIS LTX SZ7 (GLOVE) ×2 IMPLANT
GLOVE SURG ENC MOIS LTX SZ8 (GLOVE) ×4 IMPLANT
GLOVE SURG UNDER POLY LF SZ7 (GLOVE) ×2 IMPLANT
GLOVE SURG UNDER POLY LF SZ8 (GLOVE) ×2
GLOVE SURG UNDER POLY LF SZ8.5 (GLOVE) IMPLANT
GOWN STRL REUS W/TWL LRG LVL3 (GOWN DISPOSABLE) ×4 IMPLANT
GOWN STRL REUS W/TWL XL LVL3 (GOWN DISPOSABLE) IMPLANT
HIP BALL CERAMIC (Hips) ×2 IMPLANT
HOLDER FOLEY CATH W/STRAP (MISCELLANEOUS) ×2 IMPLANT
KIT TURNOVER KIT A (KITS) ×2 IMPLANT
LINER MARATHON 32 50 (Hips) ×1 IMPLANT
MANIFOLD NEPTUNE II (INSTRUMENTS) ×2 IMPLANT
PACK ANTERIOR HIP CUSTOM (KITS) ×2 IMPLANT
PENCIL SMOKE EVACUATOR COATED (MISCELLANEOUS) ×2 IMPLANT
PINNACLE SECTOR CUP 50MM (Hips) ×2 IMPLANT
STEM FEM SZ3 STD ACTIS (Stem) ×1 IMPLANT
STRIP CLOSURE SKIN 1/2X4 (GAUZE/BANDAGES/DRESSINGS) ×2 IMPLANT
SUT ETHIBOND NAB CT1 #1 30IN (SUTURE) ×2 IMPLANT
SUT MNCRL AB 4-0 PS2 18 (SUTURE) ×2 IMPLANT
SUT STRATAFIX 0 PDS 27 VIOLET (SUTURE) ×2
SUT VIC AB 2-0 CT1 27 (SUTURE) ×6
SUT VIC AB 2-0 CT1 TAPERPNT 27 (SUTURE) ×2 IMPLANT
SUTURE STRATFX 0 PDS 27 VIOLET (SUTURE) ×1 IMPLANT
SYR 50ML LL SCALE MARK (SYRINGE) IMPLANT
TRAY FOLEY MTR SLVR 16FR STAT (SET/KITS/TRAYS/PACK) ×2 IMPLANT
TUBE SUCTION HIGH CAP CLEAR NV (SUCTIONS) ×2 IMPLANT

## 2021-01-16 NOTE — Op Note (Signed)
OPERATIVE REPORT- TOTAL HIP ARTHROPLASTY   PREOPERATIVE DIAGNOSIS: Osteoarthritis of the Right hip.   POSTOPERATIVE DIAGNOSIS: Osteoarthritis of the Right  hip.   PROCEDURE: Right total hip arthroplasty, anterior approach.   SURGEON: Gaynelle Arabian, MD   ASSISTANT: Theresa Duty, PA-C  ANESTHESIA:  Spinal  ESTIMATED BLOOD LOSS:-200 mL    DRAINS: Hemovac x1.   COMPLICATIONS: None   CONDITION: PACU - hemodynamically stable.   BRIEF CLINICAL NOTE: Shannon [name] is a 55 y.o. female who has advanced end-  stage arthritis of their Right  hip with progressively worsening pain and  dysfunction.The patient has failed nonoperative management and presents for  total hip arthroplasty.   PROCEDURE IN DETAIL: After successful administration of spinal  anesthetic, the traction boots for the [facility] bed were placed on both  feet and the patient was placed onto the [employer] bed, boots placed into the leg  holders. The Right hip was then isolated from the perineum with plastic  drapes and prepped and draped in the usual sterile fashion. ASIS and  greater trochanter were marked and a oblique incision was made, starting  at about 1 cm lateral and 2 cm distal to the ASIS and coursing towards  the anterior cortex of the femur. The skin was cut with a 10 blade  through subcutaneous tissue to the level of the fascia overlying the  tensor fascia lata muscle. The fascia was then incised in line with the  incision at the junction of the anterior third and posterior 2/3rd. The  muscle was teased off the fascia and then the interval between the TFL  and the rectus was developed. The Hohmann retractor was then placed at  the top of the femoral neck over the capsule. The vessels overlying the  capsule were cauterized and the fat on top of the capsule was removed.  A Hohmann retractor was then placed anterior underneath the rectus  femoris to give exposure to the entire anterior capsule. A T-shaped   capsulotomy was performed. The edges were tagged and the femoral head  was identified.       Osteophytes are removed off the superior acetabulum.  The femoral neck was then cut in situ with an oscillating saw. Traction  was then applied to the left lower extremity utilizing the [facility]  traction. The femoral head was then removed. Retractors were placed  around the acetabulum and then circumferential removal of the labrum was  performed. Osteophytes were also removed. Reaming starts at 45 mm to  medialize and  Increased in 2 mm increments to 49 mm. We reamed in  approximately 40 degrees of abduction, 20 degrees anteversion. A 50 mm  pinnacle acetabular shell was then impacted in anatomic position under  fluoroscopic guidance with excellent purchase. We did not need to place  any additional dome screws. A 32 mm neutral + 4 marathon liner was then  placed into the acetabular shell.       The femoral lift was then placed along the lateral aspect of the femur  just distal to the vastus ridge. The leg was  externally rotated and capsule  was stripped off the inferior aspect of the femoral neck down to the  level of the lesser trochanter, this was done with electrocautery. The femur was lifted after this was performed. The  leg was then placed in an extended and adducted position essentially delivering the femur. We also removed the capsule superiorly and the piriformis from the piriformis  fossa to gain excellent exposure of the  proximal femur. Rongeur was used to remove some cancellous bone to get  into the lateral portion of the proximal femur for placement of the  initial starter reamer. The starter broaches was placed  the starter broach  and was shown to go down the center of the canal. Broaching  with the Actis system was then performed starting at size 0  coursing  Up to size 3. A size 3 had excellent torsional and rotational  and axial stability. The trial standard offset neck was then  placed  with a 32 + 5 trial head. The hip was then reduced. We confirmed that  the stem was in the canal both on AP and lateral x-rays. It also has excellent sizing. The hip was reduced with outstanding stability through full extension and full external rotation.. AP pelvis was taken and the leg lengths were measured and found to be equal. Hip was then dislocated again and the femoral head and neck removed. The  femoral broach was removed. Size 3 Actis stem with a standard offset  neck was then impacted into the femur following native anteversion. Has  excellent purchase in the canal. Excellent torsional and rotational and  axial stability. It is confirmed to be in the canal on AP and lateral  fluoroscopic views. The 32 + 5 ceramic head was placed and the hip  reduced with outstanding stability. Again AP pelvis was taken and it  confirmed that the leg lengths were equal. The wound was then copiously  irrigated with saline solution and the capsule reattached and repaired  with Ethibond suture. 30 ml of .25% Bupivicaine was  injected into the capsule and into the edge of the tensor fascia lata as well as subcutaneous tissue. The fascia overlying the tensor fascia lata was then closed with a running #1 V-Loc. Subcu was closed with interrupted 2-0 Vicryl and subcuticular running 4-0 Monocryl. Incision was cleaned  and dried. Steri-Strips and a bulky sterile dressing applied. Hemovac  drain was hooked to suction and then the patient was awakened and transported to  recovery in stable condition.        Please note that a surgical assistant was a medical necessity for this procedure to perform it in a safe and expeditious manner. Assistant was necessary to provide appropriate retraction of vital neurovascular structures and to prevent femoral fracture and allow for anatomic placement of the prosthesis.  Gaynelle Arabian, M.D.

## 2021-01-16 NOTE — Evaluation (Signed)
Physical Therapy Evaluation Patient Details Name: Shannon [name] MRN: [id number] DOB: [birth year] Today's Date: 01/16/2021   History of Present Illness  Patient is 55 y.o. female s/p Rt THA anterior approach on 01/16/21 with PMH significant for MI, HTN, HLD, Fibromyalgia, CAD, CHF, OA, asthma, anxiety, depression.    Clinical Impression  Shannon [name] is a 55 y.o. female POD 0 s/p Rt THA. Patient reports independence with mobility at baseline. Patient is now limited by functional impairments (see PT problem list below) and requires min assist for transfers and gait with RW. Patient was able to ambulate ~10 feet with RW and min assist. Distance limited by weakness/light headed feeling that improved some with seated rest. Patient instructed in exercise to facilitate circulation to manage edema and reduce risk of DVT. Patient will benefit from continued skilled PT interventions to address impairments and progress towards PLOF. Acute PT will follow to progress mobility and stair training in preparation for safe discharge home.     Follow Up Recommendations Follow surgeon's recommendation for DC plan and follow-up therapies    Equipment Recommendations  Rolling walker with 5" wheels;3in1 (PT)    Recommendations for Other Services       Precautions / Restrictions Precautions Precautions: Fall Restrictions Weight Bearing Restrictions: No Other Position/Activity Restrictions: WBAT      Mobility  Bed Mobility Overal bed mobility: Needs Assistance Bed Mobility: Supine to Sit     Supine to sit: Min assist;HOB elevated     General bed mobility comments: min cues for sequencing and use of bed rail, assist to raise trunk and pivot LE's off EOB.    Transfers Overall transfer level: Needs assistance Equipment used: Rolling walker (2 wheeled) Transfers: Sit to/from Stand Sit to Stand: Min assist         General transfer comment: cues for safe hand placement to rise from EOB, assist to  power up and steady.  Ambulation/Gait Ambulation/Gait assistance: Min assist Gait Distance (Feet): 12 Feet Assistive device: Rolling walker (2 wheeled) Gait Pattern/deviations: Step-to pattern;Decreased stride length;Decreased weight shift to right Gait velocity: decr   General Gait Details: cues for proximity to RW and step to pattern. No overt LOB noted; distance limited by c/o "weakness" and "light" feeling. seated rest provided.  Stairs            Wheelchair Mobility    Modified Rankin (Stroke Patients Only)       Balance Overall balance assessment: Needs assistance Sitting-balance support: Feet supported Sitting balance-Leahy Scale: Good     Standing balance support: During functional activity;Bilateral upper extremity supported Standing balance-Leahy Scale: Fair                               Pertinent Vitals/Pain Pain Assessment: Faces Faces Pain Scale: Hurts little more Pain Location: Rt hip Pain Descriptors / Indicators: Aching;Discomfort Pain Intervention(s): Limited activity within patient's tolerance;Monitored during session;Premedicated before session;Repositioned;Ice applied    Home Living Family/patient expects to be discharged to:: Private residence Living Arrangements: Spouse/significant other Available Help at Discharge: Family Type of Home: House Home Access: Stairs to enter     Home Layout: Two level;Bed/bath upstairs;1/2 bath on main level;Able to live on main level with bedroom/bathroom Home Equipment: None      Prior Function Level of Independence: Independent               Hand Dominance   Dominant Hand: Right    Extremity/Trunk  Assessment   Upper Extremity Assessment Upper Extremity Assessment: Overall WFL for tasks assessed    Lower Extremity Assessment Lower Extremity Assessment: RLE deficits/detail RLE Deficits / Details: pt reports some tingling in buttock, good dorsi/plantar flexion 4/5 for  strength RLE: Unable to fully assess due to pain RLE Sensation: WNL RLE Coordination: WNL    Cervical / Trunk Assessment Cervical / Trunk Assessment: Normal  Communication   Communication: No difficulties  Cognition Arousal/Alertness: Awake/alert Behavior During Therapy: WFL for tasks assessed/performed Overall Cognitive Status: Within Functional Limits for tasks assessed                                        General Comments      Exercises Total Joint Exercises Ankle Circles/Pumps: AROM;Both;20 reps;Seated   Assessment/Plan    PT Assessment Patient needs continued PT services  PT Problem List Decreased strength;Decreased range of motion;Decreased activity tolerance;Decreased balance;Decreased mobility;Decreased knowledge of use of DME;Decreased knowledge of precautions       PT Treatment Interventions DME instruction;Gait training;Stair training;Functional mobility training;Therapeutic activities;Therapeutic exercise;Balance training;Patient/family education    PT Goals (Current goals can be found in the Care Plan section)  Acute Rehab PT Goals Patient Stated Goal: get back to exercising her dogs PT Goal Formulation: With patient Time For Goal Achievement: 01/23/21 Potential to Achieve Goals: Good    Frequency 7X/week   Barriers to discharge        Co-evaluation               AM-PAC PT "6 Clicks" Mobility  Outcome Measure Help needed turning from your back to your side while in a flat bed without using bedrails?: A Little Help needed moving from lying on your back to sitting on the side of a flat bed without using bedrails?: A Little Help needed moving to and from a bed to a chair (including a wheelchair)?: A Little Help needed standing up from a chair using your arms (e.g., wheelchair or bedside chair)?: A Little Help needed to walk in hospital room?: A Little Help needed climbing 3-5 steps with a railing? : A Little 6 Click Score:  18    End of Session Equipment Utilized During Treatment: Gait belt Activity Tolerance: Patient tolerated treatment well Patient left: in chair;with call bell/phone within reach;with chair alarm set;with family/visitor present Nurse Communication: Mobility status PT Visit Diagnosis: Muscle weakness (generalized) (M62.81);Difficulty in walking, not elsewhere classified (R26.2)    Time: 3734-2876 PT Time Calculation (min) (ACUTE ONLY): 25 min   Charges:   PT Evaluation $PT Eval Low Complexity: 1 Low PT Treatments $Gait Training: 8-22 mins        Verner Mould, DPT Acute Rehabilitation Services Office [phone] Pager [phone]    Jacques Navy 01/16/2021, 5:09 PM

## 2021-01-16 NOTE — Anesthesia Postprocedure Evaluation (Signed)
Anesthesia Post Note  Patient: Shannon [name]  Procedure(s) Performed: TOTAL HIP ARTHROPLASTY ANTERIOR APPROACH (Right Hip)     Patient location during evaluation: PACU Anesthesia Type: Spinal Level of consciousness: oriented and awake and alert Pain management: pain level controlled Vital Signs Assessment: post-procedure vital signs reviewed and stable Respiratory status: spontaneous breathing, respiratory function stable and patient connected to nasal cannula oxygen Cardiovascular status: blood pressure returned to baseline and stable Postop Assessment: no headache, no backache and no apparent nausea or vomiting Anesthetic complications: no   No complications documented.  Last Vitals:  Vitals:   01/16/21 1251 01/16/21 1501  BP: 102/64 111/65  Pulse: 63 80  Resp: 20 18  Temp:  36.4 C  SpO2: 98% 99%    Last Pain:  Vitals:   01/16/21 1501  TempSrc: Oral  PainSc:                  Catherene [place]

## 2021-01-16 NOTE — Discharge Instructions (Addendum)
Gaynelle Arabian, MD Total Joint Specialist [facility] [place] [address], [address] [place],  [address] [phone]  ANTERIOR APPROACH TOTAL HIP REPLACEMENT POSTOPERATIVE DIRECTIONS     Hip Rehabilitation, Guidelines Following Surgery  The results of a hip operation are greatly improved after range of motion and muscle strengthening exercises. Follow all safety measures which are given to protect your hip. If any of these exercises cause increased pain or swelling in your joint, decrease the amount until you are comfortable again. Then slowly increase the exercises. Call your caregiver if you have problems or questions.   BLOOD CLOT PREVENTION . Take a 10 mg Xarelto once a day for three weeks following surgery. Then resume one 81 mg aspirin once a day. . You may resume your vitamins/supplements once you have discontinued the Xarelto. . Do not take any NSAIDs (Advil, Aleve, Ibuprofen, Meloxicam, etc.) until you have discontinued the Xarelto.   HOME CARE INSTRUCTIONS  . Remove items at home which could result in a fall. This includes throw rugs or furniture in walking pathways.   ICE to the affected hip as frequently as 20-30 minutes an hour and then as needed for pain and swelling. Continue to use ice on the hip for pain and swelling from surgery. You may notice swelling that will progress down to the foot and ankle. This is normal after surgery. Elevate the leg when you are not up walking on it.    Continue to use the breathing machine which will help keep your temperature down.  It is common for your temperature to cycle up and down following surgery, especially at night when you are not up moving around and exerting yourself.  The breathing machine keeps your lungs expanded and your temperature down.  DIET You may resume your previous home diet once your are discharged from the hospital.  DRESSING / WOUND CARE / SHOWERING . You have an adhesive waterproof bandage  over the incision. Leave this in place until your first follow-up appointment. Once you remove this you will not need to place another bandage.  . You may begin showering 3 days following surgery, but do not submerge the incision under water.  ACTIVITY . For the first 3-5 days, it is important to rest and keep the operative leg elevated. You should, as a general rule, rest for 50 minutes and walk/stretch for 10 minutes per hour. After 5 days, you may slowly increase activity as tolerated.  Marland Kitchen Perform the exercises you were provided twice a day for about 15-20 minutes each session. Begin these 2 days following surgery. . Walk with your walker as instructed. Use the walker until you are comfortable transitioning to a cane. Walk with the cane in the opposite hand of the operative leg. You may discontinue the cane once you are comfortable and walking steadily. . Avoid periods of inactivity such as sitting longer than an hour when not asleep. This helps prevent blood clots.  . Do not drive a car for 6 weeks or until released by your surgeon.  . Do not drive while taking narcotics.  TED HOSE STOCKINGS Wear the elastic stockings on both legs for three weeks following surgery during the day. You may remove them at night while sleeping.  WEIGHT BEARING Weight bearing as tolerated with assist device (walker, cane, etc) as directed, use it as long as suggested by your surgeon or therapist, typically at least 4-6 weeks.  POSTOPERATIVE CONSTIPATION PROTOCOL Constipation - defined medically as fewer than three  stools per week and severe constipation as less than one stool per week.  One of the most common issues patients have following surgery is constipation.  Even if you have a regular bowel pattern at home, your normal regimen is likely to be disrupted due to multiple reasons following surgery.  Combination of anesthesia, postoperative narcotics, change in appetite and fluid intake all can affect your  bowels.  In order to avoid complications following surgery, here are some recommendations in order to help you during your recovery period.  . Colace (docusate) - Pick up an over-the-counter form of Colace or another stool softener and take twice a day as long as you are requiring postoperative pain medications.  Take with a full glass of water daily.  If you experience loose stools or diarrhea, hold the colace until you stool forms back up.  If your symptoms do not get better within 1 week or if they get worse, check with your doctor. . Dulcolax (bisacodyl) - Pick up over-the-counter and take as directed by the product packaging as needed to assist with the movement of your bowels.  Take with a full glass of water.  Use this product as needed if not relieved by Colace only.  . MiraLax (polyethylene glycol) - Pick up over-the-counter to have on hand.  MiraLax is a solution that will increase the amount of water in your bowels to assist with bowel movements.  Take as directed and can mix with a glass of water, juice, soda, coffee, or tea.  Take if you go more than two days without a movement.Do not use MiraLax more than once per day. Call your doctor if you are still constipated or irregular after using this medication for 7 days in a row.  If you continue to have problems with postoperative constipation, please contact the office for further assistance and recommendations.  If you experience "the worst abdominal pain ever" or develop nausea or vomiting, please contact the office immediatly for further recommendations for treatment.  ITCHING  If you experience itching with your medications, try taking only a single pain pill, or even half a pain pill at a time.  You can also use Benadryl over the counter for itching or also to help with sleep.   MEDICATIONS See your medication summary on the "After Visit Summary" that the nursing staff will review with you prior to discharge.  You may have some home  medications which will be placed on hold until you complete the course of blood thinner medication.  It is important for you to complete the blood thinner medication as prescribed by your surgeon.  Continue your approved medications as instructed at time of discharge.  PRECAUTIONS If you experience chest pain or shortness of breath - call 911 immediately for transfer to the hospital emergency department.  If you develop a fever greater that 101 F, purulent drainage from wound, increased redness or drainage from wound, foul odor from the wound/dressing, or calf pain - CONTACT YOUR SURGEON.                                                   FOLLOW-UP APPOINTMENTS Make sure you keep all of your appointments after your operation with your surgeon and caregivers. You should call the office at the above phone number and make an appointment for approximately two  weeks after the date of your surgery or on the date instructed by your surgeon outlined in the "After Visit Summary".  RANGE OF MOTION AND STRENGTHENING EXERCISES  These exercises are designed to help you keep full movement of your hip joint. Follow your caregiver's or physical therapist's instructions. Perform all exercises about fifteen times, three times per day or as directed. Exercise both hips, even if you have had only one joint replacement. These exercises can be done on a training (exercise) mat, on the floor, on a table or on a bed. Use whatever works the best and is most comfortable for you. Use music or television while you are exercising so that the exercises are a pleasant break in your day. This will make your life better with the exercises acting as a break in routine you can look forward to.  . Lying on your back, slowly slide your foot toward your buttocks, raising your knee up off the floor. Then slowly slide your foot back down until your leg is straight again.  . Lying on your back spread your legs as far apart as you can without  causing discomfort.  . Lying on your side, raise your upper leg and foot straight up from the floor as far as is comfortable. Slowly lower the leg and repeat.  . Lying on your back, tighten up the muscle in the front of your thigh (quadriceps muscles). You can do this by keeping your leg straight and trying to raise your heel off the floor. This helps strengthen the largest muscle supporting your knee.  . Lying on your back, tighten up the muscles of your buttocks both with the legs straight and with the knee bent at a comfortable angle while keeping your heel on the floor.   POST-OPERATIVE OPIOID TAPER INSTRUCTIONS: . It is important to wean off of your opioid medication as soon as possible. If you do not need pain medication after your surgery it is ok to stop day one. Marland Kitchen Opioids include: o Codeine, Hydrocodone(Norco, Vicodin), Oxycodone(Percocet, oxycontin) and hydromorphone amongst others.  . Long term and even short term use of opiods can cause: o Increased pain response o Dependence o Constipation o Depression o Respiratory depression o And more.  . Withdrawal symptoms can include o Flu like symptoms o Nausea, vomiting o And more . Techniques to manage these symptoms o Hydrate well o Eat regular healthy meals o Stay active o Use relaxation techniques(deep breathing, meditating, yoga) . Do Not substitute Alcohol to help with tapering . If you have been on opioids for less than two weeks and do not have pain than it is ok to stop all together.  . Plan to wean off of opioids o This plan should start within one week post op of your joint replacement. o Maintain the same interval or time between taking each dose and first decrease the dose.  o Cut the total daily intake of opioids by one tablet each day o Next start to increase the time between doses. o The last dose that should be eliminated is the evening dose.     IF YOU ARE TRANSFERRED TO A SKILLED REHAB FACILITY If the  patient is transferred to a skilled rehab facility following release from the hospital, a list of the current medications will be sent to the facility for the patient to continue.  When discharged from the skilled rehab facility, please have the facility set up the patient's [place] prior to being  released. Also, the skilled facility will be responsible for providing the patient with their medications at time of release from the facility to include their pain medication, the muscle relaxants, and their blood thinner medication. If the patient is still at the rehab facility at time of the two week follow up appointment, the skilled rehab facility will also need to assist the patient in arranging follow up appointment in our office and any transportation needs.  MAKE SURE YOU:  . Understand these instructions.  . Get help right away if you are not doing well or get worse.    DENTAL ANTIBIOTICS:  In most cases prophylactic antibiotics for Dental procdeures after total joint surgery are not necessary.  Exceptions are as follows:  1. History of prior total joint infection  2. Severely immunocompromised (Organ Transplant, cancer chemotherapy, Rheumatoid biologic meds such as [place])  3. Poorly controlled diabetes (A1C &gt; 8.0, blood glucose over 200)  If you have one of these conditions, contact your surgeon for an antibiotic prescription, prior to your dental procedure.    Pick up stool softner and laxative for home use following surgery while on pain medications. Do not submerge incision under water. Please use good hand washing techniques while changing dressing each day. May shower starting three days after surgery. Please use a clean towel to pat the incision dry following showers. Continue to use ice for pain and swelling after surgery. Do not use any lotions or creams on the incision until instructed by your surgeon.  Information on my medicine - XARELTO  (Rivaroxaban)  Why was Xarelto prescribed for you? Xarelto was prescribed for you to reduce the risk of blood clots forming after orthopedic surgery. The medical term for these abnormal blood clots is venous thromboembolism (VTE).  What do you need to know about xarelto ? Take your Xarelto ONCE DAILY at the same time every day. You may take it either with or without food.  If you have difficulty swallowing the tablet whole, you may crush it and mix in applesauce just prior to taking your dose.  Take Xarelto exactly as prescribed by your doctor and DO NOT stop taking Xarelto without talking to the doctor who prescribed the medication.  Stopping without other VTE prevention medication to take the place of Xarelto may increase your risk of developing a clot.  After discharge, you should have regular check-up appointments with your healthcare provider that is prescribing your Xarelto.    What do you do if you miss a dose? If you miss a dose, take it as soon as you remember on the same day then continue your regularly scheduled once daily regimen the next day. Do not take two doses of Xarelto on the same day.   Important Safety Information A possible side effect of Xarelto is bleeding. You should call your healthcare provider right away if you experience any of the following: ? Bleeding from an injury or your nose that does not stop. ? Unusual colored urine (red or dark brown) or unusual colored stools (red or black). ? Unusual bruising for unknown reasons. ? A serious fall or if you hit your head (even if there is no bleeding).  Some medicines may interact with Xarelto and might increase your risk of bleeding while on Xarelto. To help avoid this, consult your healthcare provider or pharmacist prior to using any new prescription or non-prescription medications, including herbals, vitamins, non-steroidal anti-inflammatory drugs (NSAIDs) and supplements.  This website has more  information on Xarelto:  [email or web address]

## 2021-01-16 NOTE — Anesthesia Procedure Notes (Signed)
Spinal  Patient location during procedure: OR Start time: 01/16/2021 9:24 AM End time: 01/16/2021 9:29 AM Reason for block: surgical anesthesia Staffing Performed: anesthesiologist  Anesthesiologist: Suzette Battiest, MD Preanesthetic Checklist Completed: patient identified, IV checked, site marked, risks and benefits discussed, surgical consent, monitors and equipment checked, pre-op evaluation and timeout performed Spinal Block Patient position: sitting Prep: DuraPrep Patient monitoring: heart rate, cardiac monitor, continuous pulse ox and blood pressure Approach: midline Location: L4-5 Injection technique: single-shot Needle Needle type: Pencan  Needle gauge: 24 G Needle length: 9 cm Assessment Sensory level: T4 Events: CSF return

## 2021-01-16 NOTE — Care Plan (Signed)
Ortho Bundle Case Management Note  Patient Details  Name: Shannon [name] MRN: [id number] Date of Birth: May 13, 1966                  R THA on 01/16/21. DCP: Home to a hotel with mother. DME: RW & 3in1 ordered through [facility]. PT: HEP   DME Arranged:  [facility] Rile rolling,3-N-1 DME Agency:  [facility]  HH Arranged:    [place] Agency:     Additional Comments: Please contact me with any questions of if this plan should need to change.  Marianne Sofia, RN,CCM [facility]  [phone] 01/16/2021, 11:06 AM

## 2021-01-16 NOTE — Transfer of Care (Signed)
Immediate Anesthesia Transfer of Care Note  Patient: Shannon [name]  Procedure(s) Performed: TOTAL HIP ARTHROPLASTY ANTERIOR APPROACH (Right Hip)  Patient Location: PACU  Anesthesia Type:Spinal  Level of Consciousness: awake, alert  and oriented  Airway & Oxygen Therapy: Patient Spontanous Breathing and Patient connected to face mask oxygen  Post-op Assessment: Report given to RN and Post -op Vital signs reviewed and stable  Post vital signs: Reviewed and stable  Last Vitals:  Vitals Value Taken Time  BP 95/63 01/16/21 1110  Temp    Pulse    Resp 14 01/16/21 1111  SpO2    Vitals shown include unvalidated device data.  Last Pain:  Vitals:   01/16/21 0735  TempSrc: Oral  PainSc:       Patients Stated Pain Goal: 3 (92/11/94 1740)  Complications: No complications documented.

## 2021-01-16 NOTE — Anesthesia Procedure Notes (Signed)
Date/Time: 01/16/2021 9:30 AM Performed by: Talbot Grumbling, CRNA Oxygen Delivery Method: Simple face mask

## 2021-01-16 NOTE — Interval H&P Note (Signed)
History and Physical Interval Note:  01/16/2021 8:16 AM  Shannon [name]  has presented today for surgery, with the diagnosis of right hip osteoarthritis.  The various methods of treatment have been discussed with the patient and family. After consideration of risks, benefits and other options for treatment, the patient has consented to  Procedure(s) with comments: [place] (Right) - 141min as a surgical intervention.  The patient's history has been reviewed, patient examined, no change in status, stable for surgery.  I have reviewed the patient's chart and labs.  Questions were answered to the patient's satisfaction.     Pilar Plate Zyanya Glaza

## 2021-01-17 ENCOUNTER — Encounter (HOSPITAL_COMMUNITY): Payer: Self-pay | Admitting: Orthopedic Surgery

## 2021-01-17 DIAGNOSIS — Z79899 Other long term (current) drug therapy: Secondary | ICD-10-CM | POA: Diagnosis not present

## 2021-01-17 DIAGNOSIS — J45909 Unspecified asthma, uncomplicated: Secondary | ICD-10-CM | POA: Diagnosis not present

## 2021-01-17 DIAGNOSIS — I5032 Chronic diastolic (congestive) heart failure: Secondary | ICD-10-CM | POA: Diagnosis not present

## 2021-01-17 DIAGNOSIS — I110 Hypertensive heart disease with heart failure: Secondary | ICD-10-CM | POA: Diagnosis not present

## 2021-01-17 DIAGNOSIS — Z85828 Personal history of other malignant neoplasm of skin: Secondary | ICD-10-CM | POA: Diagnosis not present

## 2021-01-17 DIAGNOSIS — Z7982 Long term (current) use of aspirin: Secondary | ICD-10-CM | POA: Diagnosis not present

## 2021-01-17 DIAGNOSIS — M25551 Pain in right hip: Secondary | ICD-10-CM | POA: Diagnosis not present

## 2021-01-17 DIAGNOSIS — I2510 Atherosclerotic heart disease of native coronary artery without angina pectoris: Secondary | ICD-10-CM | POA: Diagnosis not present

## 2021-01-17 DIAGNOSIS — M1611 Unilateral primary osteoarthritis, right hip: Secondary | ICD-10-CM | POA: Diagnosis not present

## 2021-01-17 DIAGNOSIS — J449 Chronic obstructive pulmonary disease, unspecified: Secondary | ICD-10-CM | POA: Diagnosis not present

## 2021-01-17 LAB — CBC
HCT: 41.4 % (ref 36.0–46.0)
Hemoglobin: 13.5 g/dL (ref 12.0–15.0)
MCH: 28.7 pg (ref 26.0–34.0)
MCHC: 32.6 g/dL (ref 30.0–36.0)
MCV: 88.1 fL (ref 80.0–100.0)
Platelets: 120 10*3/uL — ABNORMAL LOW (ref 150–400)
RBC: 4.7 MIL/uL (ref 3.87–5.11)
RDW: 14.3 % (ref 11.5–15.5)
WBC: 11.5 10*3/uL — ABNORMAL HIGH (ref 4.0–10.5)
nRBC: 0 % (ref 0.0–0.2)

## 2021-01-17 LAB — BASIC METABOLIC PANEL
Anion gap: 8 (ref 5–15)
BUN: 9 mg/dL (ref 6–20)
CO2: 23 mmol/L (ref 22–32)
Calcium: 8.9 mg/dL (ref 8.9–10.3)
Chloride: 109 mmol/L (ref 98–111)
Creatinine, Ser: 0.82 mg/dL (ref 0.44–1.00)
GFR, Estimated: >60 mL/min (ref >60)
Glucose, Bld: 147 mg/dL — ABNORMAL HIGH (ref 70–99)
Potassium: 4.1 mmol/L (ref 3.5–5.1)
Sodium: 140 mmol/L (ref 135–145)

## 2021-01-17 MED ORDER — RIVAROXABAN 10 MG PO TABS
10.0000 mg | ORAL_TABLET | Freq: Every day | ORAL | 0 refills | Status: DC
Start: 2021-01-17 — End: 2021-07-03

## 2021-01-17 MED ORDER — METHOCARBAMOL 500 MG PO TABS: 500.0000 mg | ORAL_TABLET | Freq: Four times a day (QID) | ORAL | 0 refills | Status: DC | PRN

## 2021-01-17 MED ORDER — HYDROMORPHONE HCL 2 MG PO TABS: 2.0000 mg | ORAL_TABLET | Freq: Four times a day (QID) | ORAL | 0 refills | Status: DC | PRN

## 2021-01-17 NOTE — Progress Notes (Signed)
Physical Therapy Treatment Patient Details Name: Shannon [name] MRN: [id number] DOB: 06/13/1966 Today's Date: 01/17/2021    History of Present Illness Patient is 55 y.o. female s/p Rt THA anterior approach on 01/16/21 with PMH significant for MI, HTN, HLD, Fibromyalgia, CAD, CHF, OA, asthma, anxiety, depression.    PT Comments    POD # 1 pm session Assisted with amb an increased distance with spouse present for education on safe handling.  Assisted to bathroom.  Pt self able to perform all.  Completed HEP.  Addressed all mobility questions, discussed appropriate activity, educated on use of ICE.  Pt ready for D/C to home.   Follow Up Recommendations  Follow surgeon's recommendation for DC plan and follow-up therapies     Equipment Recommendations  Rolling walker with 5" wheels;3in1 (PT)    Recommendations for Other Services       Precautions / Restrictions Precautions Precautions: Fall Restrictions Weight Bearing Restrictions: No Other Position/Activity Restrictions: WBAT    Mobility  Bed Mobility               General bed mobility comments: OOB in recliner    Transfers Overall transfer level: Needs assistance Equipment used: Rolling walker (2 wheeled) Transfers: Sit to/from Stand Sit to Stand: Supervision;Min guard         General transfer comment: cues for safe hand placement to rise from EOB, assist to power up and steady.  Also assisted to bathroom.  Ambulation/Gait Ambulation/Gait assistance: Min Gaffer (Feet): 45 Feet Assistive device: Rolling walker (2 wheeled) Gait Pattern/deviations: Step-to pattern;Decreased stride length;Decreased weight shift to right Gait velocity: decreased   General Gait Details: 25% VC's on safety with turns   Stairs Stairs: Yes Stairs assistance: Supervision;Min guard Stair Management: Two rails;Step to pattern;Forwards Number of Stairs: 2 General stair comments: 25% VC's on proper sequencing  and safety   Wheelchair Mobility    Modified Rankin (Stroke Patients Only)       Balance                                            Cognition Arousal/Alertness: Awake/alert Behavior During Therapy: WFL for tasks assessed/performed Overall Cognitive Status: Within Functional Limits for tasks assessed                                 General Comments: AxO x 3 very pleasant      Exercises   5 reps all standing TE's following HEP handout   General Comments        Pertinent Vitals/Pain Pain Assessment: 0-10 Pain Score: 4  Pain Location: Rt hip Pain Descriptors / Indicators: Aching;Discomfort Pain Intervention(s): Monitored during session;Premedicated before session;Repositioned;Ice applied    Home Living                      Prior Function            PT Goals (current goals can now be found in the care plan section) Progress towards PT goals: Progressing toward goals    Frequency    7X/week      PT Plan Current plan remains appropriate    Co-evaluation              AM-PAC PT "6 Clicks" Mobility   Outcome Measure  Help needed  turning from your back to your side while in a flat bed without using bedrails?: A Little Help needed moving from lying on your back to sitting on the side of a flat bed without using bedrails?: A Little Help needed moving to and from a bed to a chair (including a wheelchair)?: A Little Help needed standing up from a chair using your arms (e.g., wheelchair or bedside chair)?: A Little Help needed to walk in hospital room?: A Little Help needed climbing 3-5 steps with a railing? : A Little 6 Click Score: 18    End of Session Equipment Utilized During Treatment: Gait belt Activity Tolerance: Patient tolerated treatment well Patient left: in chair;with call bell/phone within reach;with chair alarm set;with family/visitor present Nurse Communication: Mobility status PT Visit Diagnosis:  Muscle weakness (generalized) (M62.81);Difficulty in walking, not elsewhere classified (R26.2)     Time: 1250-1316 PT Time Calculation (min) (ACUTE ONLY): 26 min  Charges:  $Gait Training: 8-22 mins $Therapeutic Exercise: 8-22 mins                     Rica Koyanagi  PTA Acute  Rehabilitation Services Pager      [phone] Office      [phone]

## 2021-01-17 NOTE — Progress Notes (Signed)
Physical Therapy Treatment Patient Details Name: Shannon [name] MRN: [id number] DOB: 05-24-1966 Today's Date: 01/17/2021    History of Present Illness Patient is 55 y.o. female s/p Rt THA anterior approach on 01/16/21 with PMH significant for MI, HTN, HLD, Fibromyalgia, CAD, CHF, OA, asthma, anxiety, depression.    PT Comments    POD # 1 am session Assisted with amb an increased distance in hallway then returned to room to perform some TE's following HEP handout.  Instructed on proper tech, freq as well as use of ICE.   Pt will need another PT session to increase gait dustance and complete HEP.   Foley has yet to be D/C'd notified RN  Follow Up Recommendations  Follow surgeon's recommendation for DC plan and follow-up therapies     Equipment Recommendations  Rolling walker with 5" wheels;3in1 (PT)    Recommendations for Other Services       Precautions / Restrictions Precautions Precautions: Fall Restrictions Weight Bearing Restrictions: No Other Position/Activity Restrictions: WBAT    Mobility  Bed Mobility               General bed mobility comments: OOB in recliner    Transfers Overall transfer level: Needs assistance Equipment used: Rolling walker (2 wheeled) Transfers: Sit to/from Stand Sit to Stand: Supervision;Min guard         General transfer comment: cues for safe hand placement to rise from EOB, assist to power up and steady.  Ambulation/Gait Ambulation/Gait assistance: Min Gaffer (Feet): 22 Feet Assistive device: Rolling walker (2 wheeled) Gait Pattern/deviations: Step-to pattern;Decreased stride length;Decreased weight shift to right Gait velocity: decreased   General Gait Details: 25% VC's on safety with turns   Chief Strategy Officer    Modified Rankin (Stroke Patients Only)       Balance                                            Cognition Arousal/Alertness:  Awake/alert Behavior During Therapy: WFL for tasks assessed/performed Overall Cognitive Status: Within Functional Limits for tasks assessed                                 General Comments: AxO x 3 very pleasant      Exercises   Total Hip Replacement TE's following HEP Handout 10 reps ankle pumps 05 reps knee presses 05 reps heel slides 05 reps SAQ's 05 reps ABD Instructed how to use a belt loop to assist  Followed by ICE     General Comments        Pertinent Vitals/Pain Pain Assessment: 0-10 Pain Score: 4  Pain Location: Rt hip Pain Descriptors / Indicators: Aching;Discomfort Pain Intervention(s): Monitored during session;Premedicated before session;Repositioned;Ice applied    Home Living                      Prior Function            PT Goals (current goals can now be found in the care plan section) Progress towards PT goals: Progressing toward goals    Frequency    7X/week      PT Plan Current plan remains appropriate    Co-evaluation  AM-PAC PT "6 Clicks" Mobility   Outcome Measure  Help needed turning from your back to your side while in a flat bed without using bedrails?: A Little Help needed moving from lying on your back to sitting on the side of a flat bed without using bedrails?: A Little Help needed moving to and from a bed to a chair (including a wheelchair)?: A Little Help needed standing up from a chair using your arms (e.g., wheelchair or bedside chair)?: A Little Help needed to walk in hospital room?: A Little Help needed climbing 3-5 steps with a railing? : A Little 6 Click Score: 18    End of Session Equipment Utilized During Treatment: Gait belt Activity Tolerance: Patient tolerated treatment well Patient left: in chair;with call bell/phone within reach;with chair alarm set;with family/visitor present Nurse Communication: Mobility status PT Visit Diagnosis: Muscle weakness (generalized)  (M62.81);Difficulty in walking, not elsewhere classified (R26.2)     Time: 7169-6789 PT Time Calculation (min) (ACUTE ONLY): 29 min  Charges:  $Gait Training: 8-22 mins $Therapeutic Exercise: 8-22 mins

## 2021-01-17 NOTE — Progress Notes (Signed)
Subjective: 1 Day Post-Op Procedure(s) (LRB): TOTAL HIP ARTHROPLASTY ANTERIOR APPROACH (Right) Patient reports pain as mild.   Patient seen in rounds by Dr. Wynelle Link. Patient is well, and has had no acute complaints or problems. No acute overnight events. Denies SOB, chest pain, or calf pain.  We will continue therapy today.   Objective: Vital signs in last 24 hours: Temp:  [97.6 F (36.4 C)-98.7 F (37.1 C)] 97.7 F (36.5 C) (04/21 0555) Pulse Rate:  [63-84] 66 (04/21 0555) Resp:  [14-20] 15 (04/21 0555) BP: (95-143)/(60-95) 131/77 (04/21 0555) SpO2:  [96 %-100 %] 96 % (04/21 0555)  Intake/Output from previous day:  Intake/Output Summary (Last 24 hours) at 01/17/2021 0753 Last data filed at 01/17/2021 0719 Gross per 24 hour  Intake 1892.47 ml  Output 4450 ml  Net -2557.53 ml     Intake/Output this shift: Total I/O In: -  Out: 700 [Urine:700]  Labs: Recent Labs    01/17/21 0326  HGB 13.5   Recent Labs    01/17/21 0326  WBC 11.5*  RBC 4.70  HCT 41.4  PLT 120*   Recent Labs    01/17/21 0326  NA 140  K 4.1  CL 109  CO2 23  BUN 9  CREATININE 0.82  GLUCOSE 147*  CALCIUM 8.9   No results for input(s): LABPT, INR in the last 72 hours.  Exam: General - Patient is Alert and Oriented Extremity - Neurologically intact Neurovascular intact Intact pulses distally Dorsiflexion/Plantar flexion intact Dressing - dressing C/D/I Motor Function - intact, moving foot and toes well on exam.   Past Medical History:  Diagnosis Date  . Anxiety   . Arthritis    oa needs hip replacement on right  . Asthma    allergies   . Cancer ([place])    squamous cell areas removed from vulva and pre caner areas removed from leg   . Carotid artery occlusion   . Chronic diastolic HF (heart failure) ([place])    pt denies  . Complication of anesthesia    major depression after general anesthesia  . Coronary artery disease    LHC 11/25/12 90% stenosis mid LCx & otherwise  nonobstructive dz w/ EF 60-65% S/p PTCA/DES to LCx  . Depression   . Dyspnea    with exertion   . Elevated cholesterol   . Exertional dyspnea    chronic  . Fibromyalgia   . GERD (gastroesophageal reflux disease)   . History of cardiovascular stress test 06/2015   low risk  . HPV in female   . Hyperlipidemia   . Hypertension   . MI (myocardial infarction) ([place]) fe 17, 2014  . Obesity   . Pneumonia    hx of x 2 years ago   . Polycystic ovary disease   . Skin tear of upper arm without complication, left, sequela    small skin tear left upper arm no drainage for 1 week  . Sleep apnea    mild no cpap   . Tobacco abuse     Assessment/Plan: 1 Day Post-Op Procedure(s) (LRB): TOTAL HIP ARTHROPLASTY ANTERIOR APPROACH (Right) Active Problems:   Primary osteoarthritis of right hip  Estimated body mass index is 38.96 kg/m as calculated from the following:   Height as of this encounter: 5\' 4"  (1.626 m).   Weight as of this encounter: 103 kg. Advance diet Up with therapy  DVT Prophylaxis - Xarelto and TED hose Weight bearing as tolerated. Continue therapy.  Plan is to go  Home after hospital stay.  Plan for two sessions of PT today, and if meeting goals, will plan for discharge this afternoon.   Patient to follow up in two weeks with Dr. Wynelle Link in clinic.   The PDMP database was reviewed today prior to any opioid medications being prescribed to this patient.   Fenton Foy, MBA, PA-C Orthopedic Surgery 01/17/2021, 7:53 AM

## 2021-01-17 NOTE — TOC Transition Note (Signed)
Transition of Care [facility]) - CM/SW Discharge Note  Patient Details  Name: Shannon [name] MRN: [id number] Date of Birth: 1966-09-08  Transition of Care [facility]) CM/SW Contact:  Sherie Don, LCSW Phone Number: 01/17/2021, 9:53 AM  Clinical Narrative: Patient is expected to discharge today. CSW met with patient to review discharge plan. Patient will discharge with a home exercise plan (HEP) and will need a rolling walker and 3N1. [employer] to deliver DME to patient's room. TOC signing off.  Final next level of care: Home/Self Care Barriers to Discharge: No Barriers Identified  Patient Goals and CMS Choice Patient states their goals for this hospitalization and ongoing recovery are:: Discharge with HEP CMS Medicare.gov Compare Post Acute Care list provided to:: Patient Choice offered to / list presented to : Patient  Discharge Plan and Services         DME Arranged: 3-N-1,Walker rolling DME Agency: [employer] Representative spoke with at DME Agency: Pre-arranged in orthopedist's office  Readmission Risk Interventions No flowsheet data found.

## 2021-01-17 NOTE — Plan of Care (Signed)
Pt ready to DC home 

## 2021-01-23 DIAGNOSIS — J411 Mucopurulent chronic bronchitis: Secondary | ICD-10-CM | POA: Diagnosis not present

## 2021-01-23 DIAGNOSIS — E782 Mixed hyperlipidemia: Secondary | ICD-10-CM | POA: Diagnosis not present

## 2021-01-23 DIAGNOSIS — I25119 Atherosclerotic heart disease of native coronary artery with unspecified angina pectoris: Secondary | ICD-10-CM | POA: Diagnosis not present

## 2021-01-23 DIAGNOSIS — J449 Chronic obstructive pulmonary disease, unspecified: Secondary | ICD-10-CM | POA: Diagnosis not present

## 2021-01-23 DIAGNOSIS — I119 Hypertensive heart disease without heart failure: Secondary | ICD-10-CM | POA: Diagnosis not present

## 2021-01-23 DIAGNOSIS — E785 Hyperlipidemia, unspecified: Secondary | ICD-10-CM | POA: Diagnosis not present

## 2021-01-23 DIAGNOSIS — F332 Major depressive disorder, recurrent severe without psychotic features: Secondary | ICD-10-CM | POA: Diagnosis not present

## 2021-01-23 DIAGNOSIS — I10 Essential (primary) hypertension: Secondary | ICD-10-CM | POA: Diagnosis not present

## 2021-01-23 DIAGNOSIS — F3340 Major depressive disorder, recurrent, in remission, unspecified: Secondary | ICD-10-CM | POA: Diagnosis not present

## 2021-01-23 NOTE — Discharge Summary (Signed)
Physician Discharge Summary   Patient ID: Shannon [name] MRN: [id number] DOB/AGE: May 02, 1966 55 y.o.  Admit date: 01/16/2021 Discharge date: 01/17/2021  Primary Diagnosis: s/p R THA   Admission Diagnoses:  Past Medical History:  Diagnosis Date  . Anxiety   . Arthritis    oa needs hip replacement on right  . Asthma    allergies   . 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 Carotid artery occlusion   . Chronic diastolic HF (heart failure) (HCC)    pt denies  . Complication of anesthesia    major depression after general anesthesia  . Coronary artery disease    LHC 11/25/12 90% stenosis mid LCx & otherwise nonobstructive dz w/ EF 60-65% S/p PTCA/DES to LCx  . Depression   . Dyspnea    with exertion   . Elevated cholesterol   . Exertional dyspnea    chronic  . Fibromyalgia   . GERD (gastroesophageal reflux disease)   . History of cardiovascular stress test 06/2015   low risk  . HPV in female   . Hyperlipidemia   . Hypertension   . MI (myocardial infarction) (HCC) fe 17, 2014  . Obesity   . Pneumonia    hx of x 2 years ago   . Polycystic ovary disease   . Skin tear of upper arm without complication, left, sequela    small skin tear left upper arm no drainage for 1 week  . Sleep apnea    mild no cpap   . Tobacco abuse    Discharge Diagnoses:   Active Problems:   Primary osteoarthritis of right hip  Estimated body mass index is 38.96 kg/m as calculated from the following:   Height as of this encounter: 5\' 4"  (1.626 m).   Weight as of this encounter: 103 kg.  Procedure:  Procedure(s) (LRB): TOTAL HIP ARTHROPLASTY ANTERIOR APPROACH (Right)   Consults: None  HPI: Shannon [name] is a 55 y.o. female who has advanced end-  stage arthritis of their Right  hip with progressively worsening pain and  dysfunction.The patient has failed nonoperative management and presents for  total hip arthroplasty.   Laboratory  Data: Admission on 01/16/2021, Discharged on 01/17/2021  Component Date Value Ref Range Status  . WBC 01/17/2021 11.5* 4.0 - 10.5 K/uL Final  . RBC 01/17/2021 4.70  3.87 - 5.11 MIL/uL Final  . Hemoglobin 01/17/2021 13.5  12.0 - 15.0 g/dL Final  . HCT 86/57/846904/21/2022 41.4  36.0 - 46.0 % Final  . MCV 01/17/2021 88.1  80.0 - 100.0 fL Final  . MCH 01/17/2021 28.7  26.0 - 34.0 pg Final  . MCHC 01/17/2021 32.6  30.0 - 36.0 g/dL Final  . RDW 62/95/284104/21/2022 14.3  11.5 - 15.5 % Final  . Platelets 01/17/2021 120* 150 - 400 K/uL Final  . nRBC 01/17/2021 0.0  0.0 - 0.2 % Final   Performed at [facility], [address] [place], [place] [id number]  . Sodium 01/17/2021 140  135 - 145 mmol/L Final  . Potassium 01/17/2021 4.1  3.5 - 5.1 mmol/L Final  . Chloride 01/17/2021 109  98 - 111 mmol/L Final  . CO2 01/17/2021 23  22 - 32 mmol/L Final  . Glucose, Bld 01/17/2021 147* 70 - 99 mg/dL Final   Glucose reference range applies only to samples taken after fasting for at least 8 hours.  . BUN 01/17/2021 9  6 - 20 mg/dL Final  . Creatinine, Ser 01/17/2021 0.82  0.44 - 1.00 mg/dL Final  . Calcium 01/17/2021 8.9  8.9 - 10.3 mg/dL Final  . GFR, Estimated 01/17/2021 >60  >60 mL/min Final   Comment: (NOTE) Calculated using the CKD-EPI Creatinine Equation (2021)   . Anion gap 01/17/2021 8  5 - 15 Final   Performed at [facility], [place] [address] [place], [place] [address]  Hospital Outpatient Visit on 01/14/2021  Component Date Value Ref Range Status  . SARS Coronavirus 2 01/14/2021 NEGATIVE  NEGATIVE Final   Comment: (NOTE) SARS-CoV-2 target nucleic acids are NOT DETECTED.  The SARS-CoV-2 RNA is generally detectable in upper and lower respiratory specimens during the acute phase of infection. Negative results do not preclude SARS-CoV-2 infection, do not rule out co-infections with other pathogens, and should not be used as the sole basis for treatment or other patient  management decisions. Negative results must be combined with clinical observations, patient history, and epidemiological information. The expected result is Negative.  Fact Sheet for Patients: SugarRoll.be  Fact Sheet for Healthcare Providers: https://www.woods-mathews.com/  This test is not yet approved or cleared by the [place] FDA and  has been authorized for detection and/or diagnosis of SARS-CoV-2 by FDA under an Emergency Use Authorization (EUA). This EUA will remain  in effect (meaning this test can be used) for the duration of the COVID-19 declaration under Se                          ction 564(b)(1) of the Act, 21 U.S.C. section 360bbb-3(b)(1), unless the authorization is terminated or revoked sooner.  Performed at [facility], [place] [address], [place], [place] [address]   Hospital Outpatient Visit on 01/10/2021  Component Date Value Ref Range Status  . Sodium 01/10/2021 137  135 - 145 mmol/L Final  . Potassium 01/10/2021 4.5  3.5 - 5.1 mmol/L Final  . Chloride 01/10/2021 103  98 - 111 mmol/L Final  . CO2 01/10/2021 26  22 - 32 mmol/L Final  . Glucose, Bld 01/10/2021 105* 70 - 99 mg/dL Final   Glucose reference range applies only to samples taken after fasting for at least 8 hours.  . BUN 01/10/2021 13  6 - 20 mg/dL Final  . Creatinine, Ser 01/10/2021 0.90  0.44 - 1.00 mg/dL Final  . Calcium 01/10/2021 9.3  8.9 - 10.3 mg/dL Final  . Total Protein 01/10/2021 7.7  6.5 - 8.1 g/dL Final  . Albumin 01/10/2021 4.3  3.5 - 5.0 g/dL Final  . AST 01/10/2021 15  15 - 41 U/L Final  . ALT 01/10/2021 14  0 - 44 U/L Final  . Alkaline Phosphatase 01/10/2021 72  38 - 126 U/L Final  . Total Bilirubin 01/10/2021 0.6  0.3 - 1.2 mg/dL Final  . GFR, Estimated 01/10/2021 >60  >60 mL/min Final   Comment: (NOTE) Calculated using the CKD-EPI Creatinine Equation (2021)   . Anion gap 01/10/2021 8  5 - 15 Final   Performed at [facility], [place] [address] [place], [place] [address]  . Prothrombin Time 01/10/2021 13.2  11.4 - 15.2 seconds Final  . INR 01/10/2021 1.0  0.8 - 1.2 Final   Comment: (NOTE) INR goal varies based on device and disease states. Performed at [facility], [place] [address] [place], [place] [address]   . aPTT 01/10/2021 36  24 - 36 seconds  Final   Performed at [facility], [address] [place], [place] [address]  . ABO/RH(D) 01/10/2021 A POS   Final  . Antibody Screen 01/10/2021 NEG   Final  . Sample Expiration 01/10/2021 01/19/2021,2359   Final  . Extend sample reason 01/10/2021    Final                   Value:NO TRANSFUSIONS OR PREGNANCY IN THE PAST 3 MONTHS Performed at [facility], [address], [place], [place] [address]   . MRSA, PCR 01/10/2021 NEGATIVE  NEGATIVE Final  . Staphylococcus aureus 01/10/2021 NEGATIVE  NEGATIVE Final   Comment: (NOTE) The Xpert SA Assay (FDA approved for NASAL specimens in patients 63 years of age and older), is one component of a comprehensive surveillance program. It is not intended to diagnose infection nor to guide or monitor treatment. Performed at [facility], [address], [place], [place] [address]   Hospital Outpatient Visit on 12/27/2020  Component Date Value Ref Range Status  . Rest HR 12/27/2020 52  bpm Final  . Rest BP 12/27/2020 101/63  mmHg Final  . Peak HR 12/27/2020 80  bpm Final  . Peak BP 12/27/2020 113/61  mmHg Final  . SSS 12/27/2020 8   Final  . SRS 12/27/2020 4   Final  . SDS 12/27/2020 4   Final  . LHR 12/27/2020 0.29   Final  . TID 12/27/2020 1.12   Final  . LV sys vol 12/27/2020 24  mL Final  . LV dias vol 12/27/2020 76  46 - 106 mL Final     X-Rays:DG Pelvis Portable  Result Date: 01/16/2021 CLINICAL DATA:  Status post right hip arthroplasty. EXAM: PORTABLE PELVIS 1-2 VIEWS COMPARISON:  None. FINDINGS: The right femoral  and acetabular components are well situated. Expected postoperative changes are seen in the surrounding soft tissues. IMPRESSION: Status post right total hip arthroplasty. Electronically Signed   By: Lupita Raider M.D.   On: 01/16/2021 11:46   NM Myocar Multi W/Spect W/Wall Motion / EF  Result Date: 12/27/2020  No diagnostic ST segment changes to indicate ischemia.  Small, mild intensity, mid to apical anteroseptal defect that is partially reversible suggestive of either variable soft tissue attenuation or a mild ischemic territory.  This is a low risk study.  Nuclear stress EF: 69%.    DG C-Arm 1-60 Min-No Report  Result Date: 01/16/2021 CLINICAL DATA:  Hip replacement. EXAM: OPERATIVE RIGHT HIP (WITH PELVIS IF PERFORMED) 2 VIEWS TECHNIQUE: Fluoroscopic spot image(s) were submitted for interpretation post-operatively. COMPARISON:  No recent. FINDINGS: Total right hip replacement. Hardware intact. Anatomic alignment. 0 minutes 7 seconds fluoroscopy. 2.2 mGy radiation dose. IMPRESSION: Total right hip replacement.  Hardware intact.  Anatomic alignment. Electronically Signed   By: Maisie Fus  Register   On: 01/16/2021 12:26   DG HIP OPERATIVE UNILAT W OR W/O PELVIS RIGHT  Result Date: 01/16/2021 CLINICAL DATA:  Hip replacement. EXAM: OPERATIVE RIGHT HIP (WITH PELVIS IF PERFORMED) 2 VIEWS TECHNIQUE: Fluoroscopic spot image(s) were submitted for interpretation post-operatively. COMPARISON:  No recent. FINDINGS: Total right hip replacement. Hardware intact. Anatomic alignment. 0 minutes 7 seconds fluoroscopy. 2.2 mGy radiation dose. IMPRESSION: Total right hip replacement.  Hardware intact.  Anatomic alignment. Electronically Signed   By: Maisie Fus  Register   On: 01/16/2021 12:26    EKG: Orders placed or performed in visit on 11/16/20  . EKG 12-Lead     Hospital Course: Shannon [name] is a 55 y.o. who was admitted to [facility]. They were brought to the operating room on 01/16/2021 and  underwent Procedure(s): [place].  Patient tolerated the procedure well and was later transferred to the recovery room and then to the orthopaedic floor for postoperative care. They were given PO and IV analgesics for pain control following their surgery. They were given 24 hours of postoperative antibiotics of  Anti-infectives (From admission, onward)   Start     Dose/Rate Route Frequency Ordered Stop   01/16/21 1530  ceFAZolin (ANCEF) IVPB 2g/100 mL premix        2 g 200 mL/hr over 30 Minutes Intravenous Every 6 hours 01/16/21 1250 01/16/21 2017   01/16/21 0715  ceFAZolin (ANCEF) IVPB 2g/100 mL premix        2 g 200 mL/hr over 30 Minutes Intravenous On call to O.R. 01/16/21 9833 01/16/21 0935     and started on DVT prophylaxis in the form of Xarelto and TED hose.   PT and OT were ordered for total joint protocol. Discharge planning consulted to help with postop disposition and equipment needs.  Patient had an uneventful night on the evening of surgery. They started to get up OOB with therapy on 01/16/21. Pt was seen during rounds and was ready to go home pending progress with therapy. She worked with therapy on POD #1 and was meeting goals. Pt was discharged to home later that day in stable condition.  Diet: Regular diet Activity: WBAT Follow-up: in two weeks Disposition: Home Discharged Condition: good   Discharge Instructions    Call MD / Call 911   Complete by: As directed    If you experience chest pain or shortness of breath, CALL 911 and be transported to the hospital emergency room.  If you develope a fever above 101 F, pus (white drainage) or increased drainage or redness at the wound, or calf pain, call your surgeon's office.   Change dressing   Complete by: As directed    You have an adhesive waterproof bandage over the incision. Leave this in place until your first follow-up appointment. Once you remove this you will not need to place another  bandage.   Constipation Prevention   Complete by: As directed    Drink plenty of fluids.  Prune juice may be helpful.  You may use a stool softener, such as Colace (over the counter) 100 mg twice a day.  Use MiraLax (over the counter) for constipation as needed.   Diet - low sodium heart healthy   Complete by: As directed    Do not sit on low chairs, stoools or toilet seats, as it may be difficult to get up from low surfaces   Complete by: As directed    Driving restrictions   Complete by: As directed    No driving for two weeks   Post-operative opioid taper instructions:   Complete by: As directed    POST-OPERATIVE OPIOID TAPER INSTRUCTIONS: It is important to wean off of your opioid medication as soon as possible. If you do not need pain medication after your surgery it is ok to stop day one. Opioids include: Codeine, Hydrocodone(Norco, Vicodin), Oxycodone(Percocet, oxycontin) and hydromorphone amongst others.  Long term and even short term use of opiods can cause: Increased pain response Dependence Constipation Depression Respiratory depression And more.  Withdrawal symptoms can include Flu like symptoms Nausea, vomiting And more Techniques to manage these symptoms Hydrate well Eat  regular healthy meals Stay active Use relaxation techniques(deep breathing, meditating, yoga) Do Not substitute Alcohol to help with tapering If you have been on opioids for less than two weeks and do not have pain than it is ok to stop all together.  Plan to wean off of opioids This plan should start within one week post op of your joint replacement. Maintain the same interval or time between taking each dose and first decrease the dose.  Cut the total daily intake of opioids by one tablet each day Next start to increase the time between doses. The last dose that should be eliminated is the evening dose.      TED hose   Complete by: As directed    Use stockings (TED hose) for three weeks on  both leg(s).  You may remove them at night for sleeping.   Weight bearing as tolerated   Complete by: As directed      Allergies as of 01/17/2021      Reactions   Clomiphene Citrate Nausea And Vomiting   Morphine And Related Other (See Comments)   PATIENT REFUSES THIS MEDICATION: states that she does not tolerate this medication well   Sulfa Antibiotics Other (See Comments)   Not sure ? rash   Clomiphene Rash      Medication List    STOP taking these medications   aspirin EC 81 MG tablet     TAKE these medications   albuterol 108 (90 Base) MCG/ACT inhaler Commonly known as: VENTOLIN HFA Inhale 2 puffs into the lungs every 4 (four) hours as needed for wheezing or shortness of breath.   ALPRAZolam 1 MG tablet Commonly known as: XANAX Take 1 mg by mouth 2 (two) times daily as needed for anxiety.   bisoprolol-hydrochlorothiazide 5-6.25 MG tablet Commonly known as: ZIAC Take 1 tablet by mouth daily. Pt takes in the pm   escitalopram 20 MG tablet Commonly known as: LEXAPRO Take 20 mg by mouth daily. Takes in the pm   fluticasone 50 MCG/ACT nasal spray Commonly known as: FLONASE Place 2 sprays into both nostrils at bedtime.   furosemide 20 MG tablet Commonly known as: LASIX Take 20 mg by mouth daily as needed for edema.   HYDROmorphone 2 MG tablet Commonly known as: DILAUDID Take 1-2 tablets (2-4 mg total) by mouth every 6 (six) hours as needed for severe pain.   methocarbamol 500 MG tablet Commonly known as: ROBAXIN Take 1 tablet (500 mg total) by mouth every 6 (six) hours as needed for muscle spasms.   nitroGLYCERIN 0.4 MG SL tablet Commonly known as: NITROSTAT Place 1 tablet (0.4 mg total) under the tongue every 5 (five) minutes as needed for chest pain (up to 3 doses).   Oxycodone HCl 10 MG Tabs Take 10 mg by mouth 4 (four) times daily as needed (pain).   pantoprazole 40 MG tablet Commonly known as: PROTONIX Take 20 mg by mouth 2 (two) times daily as needed  (acid reflux).   rivaroxaban 10 MG Tabs tablet Commonly known as: XARELTO Take 1 tablet (10 mg total) by mouth daily with breakfast. Then take one 81 mg aspirin once a day for three weeks. Then discontinue aspirin.   rosuvastatin 20 MG tablet Commonly known as: CRESTOR Take 1 tablet (20 mg total) by mouth at bedtime.   triamcinolone cream 0.1 % Commonly known as: KENALOG Apply 1 application topically 2 (two) times daily. What changed:   when to take this  reasons to take this  Discharge Care Instructions  (From admission, onward)         Start     Ordered   01/17/21 0000  Weight bearing as tolerated        01/17/21 0758   01/17/21 0000  Change dressing       Comments: You have an adhesive waterproof bandage over the incision. Leave this in place until your first follow-up appointment. Once you remove this you will not need to place another bandage.   01/17/21 0758          Follow-up Information    Gaynelle Arabian, MD. Go on 01/29/2021.   Specialty: Orthopedic Surgery Why: You are scheduled for first post op appointment on Tuesday May 3rd at 1:00pm. Contact information: [address] [place] [address] [phone]               Signed: Fenton Foy. MBN, PA-C Orthopedic Surgery 01/23/2021, 2:27 PM

## 2021-02-05 DIAGNOSIS — K5909 Other constipation: Secondary | ICD-10-CM | POA: Diagnosis not present

## 2021-02-05 DIAGNOSIS — F329 Major depressive disorder, single episode, unspecified: Secondary | ICD-10-CM | POA: Diagnosis not present

## 2021-02-05 DIAGNOSIS — Z96649 Presence of unspecified artificial hip joint: Secondary | ICD-10-CM | POA: Diagnosis not present

## 2021-02-05 DIAGNOSIS — F411 Generalized anxiety disorder: Secondary | ICD-10-CM | POA: Diagnosis not present

## 2021-02-16 DIAGNOSIS — E782 Mixed hyperlipidemia: Secondary | ICD-10-CM | POA: Diagnosis not present

## 2021-02-16 DIAGNOSIS — J411 Mucopurulent chronic bronchitis: Secondary | ICD-10-CM | POA: Diagnosis not present

## 2021-02-16 DIAGNOSIS — J449 Chronic obstructive pulmonary disease, unspecified: Secondary | ICD-10-CM | POA: Diagnosis not present

## 2021-02-16 DIAGNOSIS — F329 Major depressive disorder, single episode, unspecified: Secondary | ICD-10-CM | POA: Diagnosis not present

## 2021-02-16 DIAGNOSIS — I25119 Atherosclerotic heart disease of native coronary artery with unspecified angina pectoris: Secondary | ICD-10-CM | POA: Diagnosis not present

## 2021-02-16 DIAGNOSIS — I119 Hypertensive heart disease without heart failure: Secondary | ICD-10-CM | POA: Diagnosis not present

## 2021-02-16 DIAGNOSIS — I10 Essential (primary) hypertension: Secondary | ICD-10-CM | POA: Diagnosis not present

## 2021-02-19 ENCOUNTER — Ambulatory Visit: Payer: Medicare HMO | Admitting: Gastroenterology

## 2021-02-21 DIAGNOSIS — J019 Acute sinusitis, unspecified: Secondary | ICD-10-CM | POA: Diagnosis not present

## 2021-03-11 DIAGNOSIS — I119 Hypertensive heart disease without heart failure: Secondary | ICD-10-CM | POA: Diagnosis not present

## 2021-03-11 DIAGNOSIS — J449 Chronic obstructive pulmonary disease, unspecified: Secondary | ICD-10-CM | POA: Diagnosis not present

## 2021-03-11 DIAGNOSIS — E782 Mixed hyperlipidemia: Secondary | ICD-10-CM | POA: Diagnosis not present

## 2021-03-11 DIAGNOSIS — I25119 Atherosclerotic heart disease of native coronary artery with unspecified angina pectoris: Secondary | ICD-10-CM | POA: Diagnosis not present

## 2021-03-11 DIAGNOSIS — I10 Essential (primary) hypertension: Secondary | ICD-10-CM | POA: Diagnosis not present

## 2021-03-11 DIAGNOSIS — J411 Mucopurulent chronic bronchitis: Secondary | ICD-10-CM | POA: Diagnosis not present

## 2021-03-11 DIAGNOSIS — F329 Major depressive disorder, single episode, unspecified: Secondary | ICD-10-CM | POA: Diagnosis not present

## 2021-03-11 DIAGNOSIS — E785 Hyperlipidemia, unspecified: Secondary | ICD-10-CM | POA: Diagnosis not present

## 2021-03-12 DIAGNOSIS — Z471 Aftercare following joint replacement surgery: Secondary | ICD-10-CM | POA: Diagnosis not present

## 2021-03-12 DIAGNOSIS — Z96641 Presence of right artificial hip joint: Secondary | ICD-10-CM | POA: Diagnosis not present

## 2021-04-05 ENCOUNTER — Other Ambulatory Visit: Payer: Self-pay

## 2021-04-05 ENCOUNTER — Ambulatory Visit
Admission: EM | Admit: 2021-04-05 | Discharge: 2021-04-05 | Disposition: A | Discharge status: Home / Self Care | Payer: Medicare HMO | Attending: Emergency Medicine | Admitting: Emergency Medicine

## 2021-04-05 ENCOUNTER — Encounter: Payer: Self-pay | Admitting: Emergency Medicine

## 2021-04-05 DIAGNOSIS — Z1152 Encounter for screening for COVID-19: Secondary | ICD-10-CM

## 2021-04-05 DIAGNOSIS — J069 Acute upper respiratory infection, unspecified: Secondary | ICD-10-CM

## 2021-04-05 MED ORDER — AZITHROMYCIN 250 MG PO TABS: 250.0000 mg | ORAL_TABLET | Freq: Every day | ORAL | 0 refills | Status: DC

## 2021-04-05 MED ORDER — BENZONATATE 100 MG PO CAPS: 100.0000 mg | ORAL_CAPSULE | Freq: Three times a day (TID) | ORAL | 0 refills | Status: DC

## 2021-04-05 NOTE — Discharge Instructions (Signed)

## 2021-04-05 NOTE — ED Triage Notes (Signed)
Pt said she has been having hoarseness, headache, a little cough with some diarrhea. Pt said not feeling so good

## 2021-04-05 NOTE — ED Provider Notes (Signed)
[place]   [id number] 04/05/21 Arrival Time: 3244   CC: COVID symptoms  SUBJECTIVE: History from: patient.  Shannon [name] is a 55 y.o. female who presents with hoarseness, headache, cough, and diarrhea x few days.  Denies sick exposure to COVID, flu or strep.  Denies alleviating or aggravating factors.  Reports previous symptoms in the past.   Denies SOB, wheezing, chest pain, nausea, vomiting, changes in bladder habits.    ROS: As per HPI.  All other pertinent ROS negative.     Past Medical History:  Diagnosis Date   Anxiety    Arthritis    oa needs hip replacement on right   Asthma    allergies    Cancer ([place])    squamous cell areas removed from vulva and pre caner areas removed from leg    Carotid artery occlusion    Chronic diastolic HF (heart failure) ([place])    pt denies   Complication of anesthesia    major depression after general anesthesia   Coronary artery disease    LHC 11/25/12 90% stenosis mid LCx & otherwise nonobstructive dz w/ EF 60-65% S/p PTCA/DES to LCx   Depression    Dyspnea    with exertion    Elevated cholesterol    Exertional dyspnea    chronic   Fibromyalgia    GERD (gastroesophageal reflux disease)    History of cardiovascular stress test 06/2015   low risk   HPV in female    Hyperlipidemia    Hypertension    MI (myocardial infarction) ([place]) fe 17, 2014   Obesity    Pneumonia    hx of x 2 years ago    Polycystic ovary disease    Skin tear of upper arm without complication, left, sequela    small skin tear left upper arm no drainage for 1 week   Sleep apnea    mild no cpap    Tobacco abuse    Past Surgical History:  Procedure Laterality Date   CORONARY ANGIOPLASTY WITH STENT PLACEMENT     LEFT HEART CATH AND CORONARY ANGIOGRAPHY N/A 08/09/2018   Procedure: LEFT HEART CATH AND CORONARY ANGIOGRAPHY;  Surgeon: Nelva Bush, MD;  Location: [facility];  Service: Cardiovascular;  Laterality: N/A;   LEFT HEART  CATHETERIZATION WITH CORONARY ANGIOGRAM N/A 11/15/2012   Procedure: LEFT HEART CATHETERIZATION WITH CORONARY ANGIOGRAM;  Surgeon: Thayer Headings, MD;  Location: [facility] CATH [facility];  Service: Cardiovascular;  Laterality: N/A;   PERCUTANEOUS CORONARY STENT INTERVENTION (PCI-S)  11/15/2012   Procedure: PERCUTANEOUS CORONARY STENT INTERVENTION (PCI-S);  Surgeon: Thayer Headings, MD;  Location: [facility];  Service: Cardiovascular;;   skin cancer removed right lower leg Right    TONSILLECTOMY     TOTAL HIP ARTHROPLASTY Right 01/16/2021   Procedure: TOTAL HIP ARTHROPLASTY ANTERIOR APPROACH;  Surgeon: Gaynelle Arabian, MD;  Location: [facility];  Service: Orthopedics;  Laterality: Right;  154min   uterus ablation     VULVECTOMY N/A 11/08/2019   Procedure: excision of VULVA, vulvoscopy;  Surgeon: Dian Queen, MD;  Location: [facility];  Service: Gynecology;  Laterality: N/A;   Allergies  Allergen Reactions   Clomiphene Citrate Nausea And Vomiting   Morphine And Related Other (See Comments)    PATIENT REFUSES THIS MEDICATION: states that she does not tolerate this medication well   Sulfa Antibiotics Other (See Comments)    Not sure ? rash   Clomiphene Rash   No current facility-administered  medications on file prior to encounter.   Current Outpatient Medications on File Prior to Encounter  Medication Sig Dispense Refill   albuterol (PROVENTIL HFA;VENTOLIN HFA) 108 (90 Base) MCG/ACT inhaler Inhale 2 puffs into the lungs every 4 (four) hours as needed for wheezing or shortness of breath. 1 Inhaler 0   ALPRAZolam (XANAX) 1 MG tablet Take 1 mg by mouth 2 (two) times daily as needed for anxiety.  2   bisoprolol-hydrochlorothiazide (ZIAC) 5-6.25 MG tablet Take 1 tablet by mouth daily. Pt takes in the pm     escitalopram (LEXAPRO) 20 MG tablet Take 20 mg by mouth daily. Takes in the pm     fluticasone (FLONASE) 50 MCG/ACT nasal spray Place 2 sprays into both nostrils at bedtime.     furosemide  (LASIX) 20 MG tablet Take 20 mg by mouth daily as needed for edema.     HYDROmorphone (DILAUDID) 2 MG tablet Take 1-2 tablets (2-4 mg total) by mouth every 6 (six) hours as needed for severe pain. 42 tablet 0   methocarbamol (ROBAXIN) 500 MG tablet Take 1 tablet (500 mg total) by mouth every 6 (six) hours as needed for muscle spasms. 40 tablet 0   nitroGLYCERIN (NITROSTAT) 0.4 MG SL tablet Place 1 tablet (0.4 mg total) under the tongue every 5 (five) minutes as needed for chest pain (up to 3 doses). 25 tablet 3   Oxycodone HCl 10 MG TABS Take 10 mg by mouth 4 (four) times daily as needed (pain).     pantoprazole (PROTONIX) 40 MG tablet Take 20 mg by mouth 2 (two) times daily as needed (acid reflux).     rivaroxaban (XARELTO) 10 MG TABS tablet Take 1 tablet (10 mg total) by mouth daily with breakfast. Then take one 81 mg aspirin once a day for three weeks. Then discontinue aspirin. 21 tablet 0   rosuvastatin (CRESTOR) 20 MG tablet Take 1 tablet (20 mg total) by mouth at bedtime. 30 tablet 6   triamcinolone cream (KENALOG) 0.1 % Apply 1 application topically 2 (two) times daily. (Patient taking differently: Apply 1 application topically 2 (two) times daily as needed (irritation).) 454 g 0   Social History   Socioeconomic History   Marital status: Legally Separated    Spouse name: Not on file   Number of children: Not on file   Years of education: Not on file   Highest education level: Not on file  Occupational History   Not on file  Tobacco Use   Smoking status: Every Day    Packs/day: 2.00    Years: 40.00    Pack years: 80.00    Types: Cigarettes    Start date: 03/29/1979   Smokeless tobacco: Never   Tobacco comments:    Currently 2ppd  Vaping Use   Vaping Use: Never used  Substance and Sexual Activity   Alcohol use: Yes    Alcohol/week: 0.0 standard drinks    Comment: rare   Drug use: No   Sexual activity: Not Currently    Partners: Male  Other Topics Concern   Not on file   Social History Narrative   Lives with husband in a 3 story home.  Has no children.     On disability.     Education: high school, some college.   Social Determinants of Health   Financial Resource Strain: Not on file  Food Insecurity: Not on file  Transportation Needs: Not on file  Physical Activity: Not on file  Stress: Not  on file  Social Connections: Not on file  Intimate Partner Violence: Not on file   Family History  Problem Relation Age of Onset   Depression Mother    Anxiety disorder Maternal Grandmother    Anxiety disorder Paternal Grandmother    OCD Paternal Grandmother    Depression Paternal Grandmother    Heart attack Father        Deceased, 36   Cerebral aneurysm Father    Heart disease Father     OBJECTIVE:  Vitals:   04/05/21 1350  BP: 132/84  Pulse: 73  Resp: 18  Temp: 98 F (36.7 C)  SpO2: 97%     General appearance: alert; appears fatigued, but nontoxic; speaking in full sentences and tolerating own secretions HEENT: NCAT; Ears: EACs clear, TMs pearly gray; Eyes: PERRL.  EOM grossly intact. Sinuses: nontender; Nose: nares patent without rhinorrhea, Throat: oropharynx clear, tonsils non erythematous or enlarged, uvula midline  Neck: supple without LAD Lungs: unlabored respirations, symmetrical air entry; cough: mild; no respiratory distress; CTAB Heart: regular rate and rhythm.   Skin: warm and dry Psychological: alert and cooperative; normal mood and affect  ASSESSMENT & PLAN:  1. Encounter for screening for COVID-19   2. Viral URI with cough     Meds ordered this encounter  Medications   benzonatate (TESSALON) 100 MG capsule    Sig: Take 1 capsule (100 mg total) by mouth every 8 (eight) hours.    Dispense:  21 capsule    Refill:  0    Order Specific Question:   Supervising Provider    Answer:   Raylene Everts [[id number]]   azithromycin (ZITHROMAX) 250 MG tablet    Sig: Take 1 tablet (250 mg total) by mouth daily. Take first 2  tablets together, then 1 every day until finished.    Dispense:  6 tablet    Refill:  0    Order Specific Question:   Supervising Provider    Answer:   Raylene Everts [[id number]]     COVID testing ordered.  It will take between 5-7 days for test results.  Someone will contact you regarding abnormal results.    In the meantime: You should remain isolated in your home for 5 days from symptom onset AND greater than 72 hours after symptoms resolution (absence of fever without the use of fever-reducing medication and improvement in respiratory symptoms), whichever is longer Get plenty of rest and push fluids Tessalon Perles prescribed for cough Use OTC zyrtec for nasal congestion, runny nose, and/or sore throat Use OTC flonase for nasal congestion and runny nose Use medications daily for symptom relief Use OTC medications like ibuprofen or tylenol as needed fever or pain Call or go to the ED if you have any new or worsening symptoms such as fever, worsening cough, shortness of breath, chest tightness, chest pain, turning blue, changes in mental status, etc...   Symptoms most likely related to viral illness.  Patient requests z-pak.  Educated patient that antibiotics do not treat viral illnesses.  Patient is insisting that an antibiotic will be the only thing that clears her head congestion and ear pressure.  Encouraged patient to wait 2-3 days prior to starting.    Reviewed expectations re: course of current medical issues. Questions answered. Outlined signs and symptoms indicating need for more acute intervention. Patient verbalized understanding. After Visit Summary given.          Lestine Box, PA-C 04/05/21 1433

## 2021-04-06 LAB — NOVEL CORONAVIRUS, NAA: SARS-CoV-2, NAA: NOT DETECTED

## 2021-04-06 LAB — SARS-COV-2, NAA 2 DAY TAT

## 2021-04-23 ENCOUNTER — Ambulatory Visit: Payer: Medicare HMO | Admitting: Gastroenterology

## 2021-05-21 DIAGNOSIS — M549 Dorsalgia, unspecified: Secondary | ICD-10-CM | POA: Diagnosis not present

## 2021-05-21 DIAGNOSIS — D071 Carcinoma in situ of vulva: Secondary | ICD-10-CM | POA: Diagnosis not present

## 2021-05-21 DIAGNOSIS — N9089 Other specified noninflammatory disorders of vulva and perineum: Secondary | ICD-10-CM | POA: Diagnosis not present

## 2021-05-24 DIAGNOSIS — J449 Chronic obstructive pulmonary disease, unspecified: Secondary | ICD-10-CM | POA: Diagnosis not present

## 2021-05-24 DIAGNOSIS — J411 Mucopurulent chronic bronchitis: Secondary | ICD-10-CM | POA: Diagnosis not present

## 2021-05-24 DIAGNOSIS — I119 Hypertensive heart disease without heart failure: Secondary | ICD-10-CM | POA: Diagnosis not present

## 2021-05-24 DIAGNOSIS — I10 Essential (primary) hypertension: Secondary | ICD-10-CM | POA: Diagnosis not present

## 2021-05-24 DIAGNOSIS — I25119 Atherosclerotic heart disease of native coronary artery with unspecified angina pectoris: Secondary | ICD-10-CM | POA: Diagnosis not present

## 2021-05-24 DIAGNOSIS — E782 Mixed hyperlipidemia: Secondary | ICD-10-CM | POA: Diagnosis not present

## 2021-05-24 DIAGNOSIS — F329 Major depressive disorder, single episode, unspecified: Secondary | ICD-10-CM | POA: Diagnosis not present

## 2021-07-02 NOTE — Progress Notes (Signed)
Cardiology Office Note    Date:  07/03/2021   ID:  Shannon [name], Shannon [name] 1965/10/02, MRN [id number]  PCP:  Carolee Rota, NP  Cardiologist: Previously Dr. Bronson Ing --> Needs to switch to new MD  Chief Complaint  Patient presents with   Follow-up    6 month visit    History of Present Illness:    Shannon [name] is a 55 y.o. female with past medical history of CAD (s/p DES to LCx in 2014, cath in 07/2018 showing patent stent with mild to moderate nonobstructive disease involving the mid-LAD and RCA, low-risk NST in 11/2020), carotid artery stenosis (known LICA occlusion), HTN, HLD and tobacco use who presents to the office today for 32-month follow-up.  She was examined by myself in 10/2020 and had recently been evaluated in the ED for hypertensive urgency but BP had improved upon returning home and was well controlled at the time of her visit. She did report dyspnea on exertion and given the need for cardiac clearance in regards to upcoming hip replacement, a Lexiscan Myoview was recommended for ischemic evaluation. This was overall a low-risk study. She did undergo surgery in 12/2020 with no immediate complications noted.  In talking with the patient today, she has been under more stress as her mother was recently diagnosed with Stage 4 pancreatic cancer and is starting treatments. She has noticed episodes of chest discomfort when feeling anxious or stressed but denies any exertional symptoms. Has not utilized SL NTG. She does have dyspnea on exertion but this has been stable. Has noticed a productive cough with rust-colored phlegm. Questions if this is due to her smoking more due to her stress (currently smoking 2 ppd). No recent orthopnea, PND or pitting edema.   Past Medical History:  Diagnosis Date   Anxiety    Arthritis    oa needs hip replacement on right   Asthma    allergies    Cancer ([place])    squamous cell areas removed from vulva and pre caner areas removed from leg     Carotid artery occlusion    Chronic diastolic HF (heart failure) ([place])    pt denies   Complication of anesthesia    major depression after general anesthesia   Coronary artery disease    LHC 11/25/12 90% stenosis mid LCx & otherwise nonobstructive dz w/ EF 60-65% S/p PTCA/DES to LCx   Depression    Dyspnea    with exertion    Elevated cholesterol    Exertional dyspnea    chronic   Fibromyalgia    GERD (gastroesophageal reflux disease)    History of cardiovascular stress test 06/2015   low risk   HPV in female    Hyperlipidemia    Hypertension    MI (myocardial infarction) ([place]) fe 17, 2014   Obesity    Pneumonia    hx of x 2 years ago    Polycystic ovary disease    Skin tear of upper arm without complication, left, sequela    small skin tear left upper arm no drainage for 1 week   Sleep apnea    mild no cpap    Tobacco abuse     Past Surgical History:  Procedure Laterality Date   CORONARY ANGIOPLASTY WITH STENT PLACEMENT     LEFT HEART CATH AND CORONARY ANGIOGRAPHY N/A 08/09/2018   Procedure: LEFT HEART CATH AND CORONARY ANGIOGRAPHY;  Surgeon: Nelva Bush, MD;  Location: [place] [place] [facility];  Service: Cardiovascular;  Laterality:  N/A;   LEFT HEART CATHETERIZATION WITH CORONARY ANGIOGRAM N/A 11/15/2012   Procedure: LEFT HEART CATHETERIZATION WITH CORONARY ANGIOGRAM;  Surgeon: Thayer Headings, MD;  Location: [facility] CATH LAB;  Service: Cardiovascular;  Laterality: N/A;   PERCUTANEOUS CORONARY STENT INTERVENTION (PCI-S)  11/15/2012   Procedure: PERCUTANEOUS CORONARY STENT INTERVENTION (PCI-S);  Surgeon: Thayer Headings, MD;  Location: [facility] CATH LAB;  Service: Cardiovascular;;   skin cancer removed right lower leg Right    TONSILLECTOMY     TOTAL HIP ARTHROPLASTY Right 01/16/2021   Procedure: TOTAL HIP ARTHROPLASTY ANTERIOR APPROACH;  Surgeon: Gaynelle Arabian, MD;  Location: [facility];  Service: Orthopedics;  Laterality: Right;  147min   uterus ablation     VULVECTOMY N/A 11/08/2019    Procedure: excision of VULVA, vulvoscopy;  Surgeon: Dian Queen, MD;  Location: [facility];  Service: Gynecology;  Laterality: N/A;    Current Medications: Outpatient Medications Prior to Visit  Medication Sig Dispense Refill   albuterol (PROVENTIL HFA;VENTOLIN HFA) 108 (90 Base) MCG/ACT inhaler Inhale 2 puffs into the lungs every 4 (four) hours as needed for wheezing or shortness of breath. 1 Inhaler 0   ALPRAZolam (XANAX) 1 MG tablet Take 1 mg by mouth 2 (two) times daily as needed for anxiety.  2   aspirin EC 81 MG tablet Take 81 mg by mouth daily. Swallow whole.     bisoprolol-hydrochlorothiazide (ZIAC) 5-6.25 MG tablet Take 1 tablet by mouth daily. Pt takes in the pm     escitalopram (LEXAPRO) 20 MG tablet Take 20 mg by mouth daily. Takes in the pm     fluticasone (FLONASE) 50 MCG/ACT nasal spray Place 2 sprays into both nostrils at bedtime.     Oxycodone HCl 10 MG TABS Take 10 mg by mouth 4 (four) times daily as needed (pain).     pantoprazole (PROTONIX) 40 MG tablet Take 20 mg by mouth 2 (two) times daily as needed (acid reflux).     rosuvastatin (CRESTOR) 20 MG tablet Take 1 tablet (20 mg total) by mouth at bedtime. 30 tablet 6   triamcinolone cream (KENALOG) 0.1 % Apply 1 application topically 2 (two) times daily. (Patient taking differently: Apply 1 application topically 2 (two) times daily as needed (irritation).) 454 g 0   nitroGLYCERIN (NITROSTAT) 0.4 MG SL tablet Place 1 tablet (0.4 mg total) under the tongue every 5 (five) minutes as needed for chest pain (up to 3 doses). 25 tablet 3   azithromycin (ZITHROMAX) 250 MG tablet Take 1 tablet (250 mg total) by mouth daily. Take first 2 tablets together, then 1 every day until finished. (Patient not taking: Reported on 07/03/2021) 6 tablet 0   benzonatate (TESSALON) 100 MG capsule Take 1 capsule (100 mg total) by mouth every 8 (eight) hours. (Patient not taking: Reported on 07/03/2021) 21 capsule 0   furosemide (LASIX)  20 MG tablet Take 20 mg by mouth daily as needed for edema. (Patient not taking: Reported on 07/03/2021)     HYDROmorphone (DILAUDID) 2 MG tablet Take 1-2 tablets (2-4 mg total) by mouth every 6 (six) hours as needed for severe pain. (Patient not taking: Reported on 07/03/2021) 42 tablet 0   methocarbamol (ROBAXIN) 500 MG tablet Take 1 tablet (500 mg total) by mouth every 6 (six) hours as needed for muscle spasms. (Patient not taking: Reported on 07/03/2021) 40 tablet 0   rivaroxaban (XARELTO) 10 MG TABS tablet Take 1 tablet (10 mg total) by mouth daily with breakfast. Then take one  81 mg aspirin once a day for three weeks. Then discontinue aspirin. (Patient not taking: Reported on 07/03/2021) 21 tablet 0   No facility-administered medications prior to visit.     Allergies:   Clomiphene citrate, Morphine and related, Sulfa antibiotics, and Clomiphene   Social History   Socioeconomic History   Marital status: Legally Separated    Spouse name: Not on file   Number of children: Not on file   Years of education: Not on file   Highest education level: Not on file  Occupational History   Not on file  Tobacco Use   Smoking status: Every Day    Packs/day: 2.00    Years: 40.00    Pack years: 80.00    Types: Cigarettes    Start date: 03/29/1979   Smokeless tobacco: Never   Tobacco comments:    Currently 2ppd  Vaping Use   Vaping Use: Never used  Substance and Sexual Activity   Alcohol use: Not Currently    Comment: rare   Drug use: No   Sexual activity: Not Currently    Partners: Male  Other Topics Concern   Not on file  Social History Narrative   Lives with husband in a 3 story home.  Has no children.     On disability.     Education: high school, some college.   Social Determinants of Health   Financial Resource Strain: Not on file  Food Insecurity: Not on file  Transportation Needs: Not on file  Physical Activity: Not on file  Stress: Not on file  Social Connections: Not on  file     Family History:  The patient's family history includes Anxiety disorder in her maternal grandmother and paternal grandmother; Cerebral aneurysm in her father; Depression in her mother and paternal grandmother; Heart attack in her father; Heart disease in her father; OCD in her paternal grandmother.   Review of Systems:    Please see the history of present illness.     All other systems reviewed and are otherwise negative except as noted above.   Physical Exam:    VS:  BP 132/80   Pulse (!) 59   Ht 5\' 4"  (1.626 m)   Wt 229 lb (103.9 kg)   SpO2 98%   BMI 39.31 kg/m    General: Well developed, well nourished,female appearing in no acute distress. Head: Normocephalic, atraumatic. Neck: No carotid bruits. JVD not elevated.  Lungs: Respirations regular and unlabored, without wheezes or rales.  Heart: Regular rate and rhythm. No S3 or S4.  No murmur, no rubs, or gallops appreciated. Abdomen: Appears non-distended. No obvious abdominal masses. Msk:  Strength and tone appear normal for age. No obvious joint deformities or effusions. Extremities: No clubbing or cyanosis. No pitting edema.  Distal pedal pulses are 2+ bilaterally. Neuro: Alert and oriented X 3. Moves all extremities spontaneously. No focal deficits noted. Psych:  Responds to questions appropriately with a normal affect. Skin: No rashes or lesions noted  Wt Readings from Last 3 Encounters:  07/03/21 229 lb (103.9 kg)  01/16/21 227 lb (103 kg)  11/16/20 226 lb (102.5 kg)     Studies/Labs Reviewed:   EKG:  EKG is ordered today.  The ekg ordered today demonstrates sinus bradycardia, HR 57 with no acute ST changes.   Recent Labs: 01/10/2021: ALT 14 01/17/2021: BUN 9; Creatinine, Ser 0.82; Hemoglobin 13.5; Platelets 120; Potassium 4.1; Sodium 140   Lipid Panel    Component Value Date/Time   CHOL  194 08/10/2018 0539   TRIG 264 (H) 08/10/2018 0539   HDL 36 (L) 08/10/2018 0539   CHOLHDL 5.4 08/10/2018 0539    VLDL 53 (H) 08/10/2018 0539   LDLCALC 105 (H) 08/10/2018 0539   LDLDIRECT 200.3 09/04/2009 1308    Additional studies/ records that were reviewed today include:   Cardiac Catheterization: 07/2018 Conclusions: Mild to moderate, non-obstructive coronary artery disease involving the mid LAD and RCA. Widely patent proximal/mid LCx stent. Normal left ventricular contraction with mildly elevated filling pressure.   Recommendations: Medical therapy and aggressive secondary prevention, including smoking cessation, weight loss, and lipid control.  Consider increasing statin therapy if LDL not at goal (<70). Start furosemide 20 mg daily for component of diastolic heart failure.   Recommend Aspirin 81mg  daily for moderate CAD.    NST: 11/2020 No diagnostic ST segment changes to indicate ischemia. Small, mild intensity, mid to apical anteroseptal defect that is partially reversible suggestive of either variable soft tissue attenuation or a mild ischemic territory. This is a low risk study. Nuclear stress EF: 69%.    Assessment:    1. Coronary artery disease of native artery of native heart with stable angina pectoris ([place])   2. Productive cough   3. Bilateral carotid artery stenosis   4. Essential hypertension   5. Hyperlipidemia LDL goal <70      Plan:   In order of problems listed above:  1. CAD - She is s/p DES to LCx in 2014 with cath in 07/2018 showing patent stent with mild to moderate nonobstructive disease involving the mid-LAD and RCA. Recent NST in 11/2020 was low-risk.  - Her episodes of chest pain described above seem most consistent with her increased stress and anxiety. EKG today is without acute ST changes. She does report a productive cough and will obtain a CXR. Given her atypical symptoms and recent low-risk NST, would focus on medical therapy for now. We reviewed she could try SL NTG at home to see if this helps. If it does, can provide an Rx for Imdur. Continue ASA  81mg  daily, Bisoprolol-HCTZ 5-6.25mg  daily and Crestor 20mg  daily.  2. Carotid Artery Stenosis  - Followed by Vascular Surgery. She has a known LICA occlusion. It has been a few years since her last study so if not obtained by Vascular in the interim, would plan for carotid dopplers at her next visit.  - Continue ASA and statin therapy.  3. HTN - Her BP is well-controlled at 132/80 during today's visit. Listed as being on both HCTZ and Lasix but uses Lasix very infrequently. Continue current medication regimen.   4. HLD - Followed by her PCP. Will request a copy of most recent labs from her PCP. Continue Crestor 20mg  daily with goal LDL less than 70 given known CAD.    Medication Adjustments/Labs and Tests Ordered: Current medicines are reviewed at length with the patient today.  Concerns regarding medicines are outlined above.  Medication changes, Labs and Tests ordered today are listed in the Patient Instructions below. Patient Instructions  Medication Instructions:  Your physician recommends that you continue on your current medications as directed. Please refer to the Current Medication list given to you today.  *If you need a refill on your cardiac medications before your next appointment, please call your pharmacy*   Lab Work: NONE   If you have labs (blood work) drawn today and your tests are completely normal, you will receive your results only by: [place] (if you have  MyChart) OR A paper copy in the mail If you have any lab test that is abnormal or we need to change your treatment, we will call you to review the results.   Testing/Procedures: A chest x-ray takes a picture of the organs and structures inside the chest, including the heart, lungs, and blood vessels. This test can show several things, including, whether the heart is enlarges; whether fluid is building up in the lungs; and whether pacemaker / defibrillator leads are still in place.    Follow-Up: At  [facility], you and your health needs are our priority.  As part of our continuing mission to provide you with exceptional heart care, we have created designated Provider Care Teams.  These Care Teams include your primary Cardiologist (physician) and Advanced Practice Providers (APPs -  Physician Assistants and Nurse Practitioners) who all work together to provide you with the care you need, when you need it.  We recommend signing up for the patient portal called "MyChart".  Sign up information is provided on this After Visit Summary.  MyChart is used to connect with patients for Virtual Visits (Telemedicine).  Patients are able to view lab/test results, encounter notes, upcoming appointments, etc.  Non-urgent messages can be sent to your provider as well.   To learn more about what you can do with MyChart, go to [email or web address].    Your next appointment:   6 month(s)  The format for your next appointment:   In Person  Provider:   Bernerd Pho, PA-C or with MD    Other Instructions Thank you for choosing [facility]!     Signed, Erma Heritage, PA-C  07/03/2021 8:06 PM    [facility] [address] [place], [place] [address] Phone: [phone] Fax: [phone]

## 2021-07-03 ENCOUNTER — Ambulatory Visit: Payer: Medicare HMO | Admitting: Student

## 2021-07-03 ENCOUNTER — Encounter: Payer: Self-pay | Admitting: Student

## 2021-07-03 ENCOUNTER — Other Ambulatory Visit: Payer: Self-pay

## 2021-07-03 VITALS — BP 132/80 | HR 59 | Ht 64.0 in | Wt 229.0 lb

## 2021-07-03 DIAGNOSIS — I25118 Atherosclerotic heart disease of native coronary artery with other forms of angina pectoris: Secondary | ICD-10-CM

## 2021-07-03 DIAGNOSIS — I10 Essential (primary) hypertension: Secondary | ICD-10-CM

## 2021-07-03 DIAGNOSIS — R058 Other specified cough: Secondary | ICD-10-CM | POA: Diagnosis not present

## 2021-07-03 DIAGNOSIS — E785 Hyperlipidemia, unspecified: Secondary | ICD-10-CM

## 2021-07-03 DIAGNOSIS — I6523 Occlusion and stenosis of bilateral carotid arteries: Secondary | ICD-10-CM | POA: Diagnosis not present

## 2021-07-03 MED ORDER — NITROGLYCERIN 0.4 MG SL SUBL: 0.4000 mg | SUBLINGUAL_TABLET | SUBLINGUAL | 3 refills | Status: DC | PRN

## 2021-07-03 NOTE — Patient Instructions (Addendum)
Medication Instructions:  Your physician recommends that you continue on your current medications as directed. Please refer to the Current Medication list given to you today.  *If you need a refill on your cardiac medications before your next appointment, please call your pharmacy*   Lab Work: NONE   If you have labs (blood work) drawn today and your tests are completely normal, you will receive your results only by: [place] (if you have MyChart) OR A paper copy in the mail If you have any lab test that is abnormal or we need to change your treatment, we will call you to review the results.   Testing/Procedures: A chest x-ray takes a picture of the organs and structures inside the chest, including the heart, lungs, and blood vessels. This test can show several things, including, whether the heart is enlarges; whether fluid is building up in the lungs; and whether pacemaker / defibrillator leads are still in place.    Follow-Up: At [facility], you and your health needs are our priority.  As part of our continuing mission to provide you with exceptional heart care, we have created designated Provider Care Teams.  These Care Teams include your primary Cardiologist (physician) and Advanced Practice Providers (APPs -  Physician Assistants and Nurse Practitioners) who all work together to provide you with the care you need, when you need it.  We recommend signing up for the patient portal called "MyChart".  Sign up information is provided on this After Visit Summary.  MyChart is used to connect with patients for Virtual Visits (Telemedicine).  Patients are able to view lab/test results, encounter notes, upcoming appointments, etc.  Non-urgent messages can be sent to your provider as well.   To learn more about what you can do with MyChart, go to [email or web address].    Your next appointment:   6 month(s)  The format for your next appointment:   In Person  Provider:    Bernerd Pho, PA-C or with MD    Other Instructions Thank you for choosing [facility]!

## 2021-08-14 ENCOUNTER — Emergency Department (HOSPITAL_COMMUNITY)
Admission: EM | Admit: 2021-08-14 | Discharge: 2021-08-14 | Disposition: A | Discharge status: Home / Self Care | Payer: Medicare HMO | Attending: Emergency Medicine | Admitting: Emergency Medicine

## 2021-08-14 ENCOUNTER — Emergency Department (HOSPITAL_COMMUNITY): Payer: Medicare HMO

## 2021-08-14 ENCOUNTER — Telehealth: Payer: Self-pay | Admitting: Cardiology

## 2021-08-14 ENCOUNTER — Encounter (HOSPITAL_COMMUNITY): Payer: Self-pay

## 2021-08-14 DIAGNOSIS — Z7982 Long term (current) use of aspirin: Secondary | ICD-10-CM | POA: Insufficient documentation

## 2021-08-14 DIAGNOSIS — Z85828 Personal history of other malignant neoplasm of skin: Secondary | ICD-10-CM | POA: Insufficient documentation

## 2021-08-14 DIAGNOSIS — I110 Hypertensive heart disease with heart failure: Secondary | ICD-10-CM | POA: Diagnosis not present

## 2021-08-14 DIAGNOSIS — R0789 Other chest pain: Secondary | ICD-10-CM | POA: Diagnosis not present

## 2021-08-14 DIAGNOSIS — F17210 Nicotine dependence, cigarettes, uncomplicated: Secondary | ICD-10-CM | POA: Insufficient documentation

## 2021-08-14 DIAGNOSIS — M546 Pain in thoracic spine: Secondary | ICD-10-CM | POA: Insufficient documentation

## 2021-08-14 DIAGNOSIS — Z96641 Presence of right artificial hip joint: Secondary | ICD-10-CM | POA: Diagnosis not present

## 2021-08-14 DIAGNOSIS — I2510 Atherosclerotic heart disease of native coronary artery without angina pectoris: Secondary | ICD-10-CM | POA: Insufficient documentation

## 2021-08-14 DIAGNOSIS — R0602 Shortness of breath: Secondary | ICD-10-CM | POA: Diagnosis not present

## 2021-08-14 DIAGNOSIS — G8929 Other chronic pain: Secondary | ICD-10-CM

## 2021-08-14 DIAGNOSIS — I5032 Chronic diastolic (congestive) heart failure: Secondary | ICD-10-CM | POA: Diagnosis not present

## 2021-08-14 DIAGNOSIS — R911 Solitary pulmonary nodule: Secondary | ICD-10-CM | POA: Diagnosis not present

## 2021-08-14 DIAGNOSIS — Z955 Presence of coronary angioplasty implant and graft: Secondary | ICD-10-CM | POA: Diagnosis not present

## 2021-08-14 DIAGNOSIS — J45909 Unspecified asthma, uncomplicated: Secondary | ICD-10-CM | POA: Insufficient documentation

## 2021-08-14 DIAGNOSIS — Z79899 Other long term (current) drug therapy: Secondary | ICD-10-CM | POA: Insufficient documentation

## 2021-08-14 DIAGNOSIS — R079 Chest pain, unspecified: Secondary | ICD-10-CM | POA: Diagnosis not present

## 2021-08-14 LAB — CBC
HCT: 48.6 % — ABNORMAL HIGH (ref 36.0–46.0)
Hemoglobin: 16.2 g/dL — ABNORMAL HIGH (ref 12.0–15.0)
MCH: 30.1 pg (ref 26.0–34.0)
MCHC: 33.3 g/dL (ref 30.0–36.0)
MCV: 90.2 fL (ref 80.0–100.0)
Platelets: 132 10*3/uL — ABNORMAL LOW (ref 150–400)
RBC: 5.39 MIL/uL — ABNORMAL HIGH (ref 3.87–5.11)
RDW: 14.6 % (ref 11.5–15.5)
WBC: 7.9 10*3/uL (ref 4.0–10.5)
nRBC: 0 % (ref 0.0–0.2)

## 2021-08-14 LAB — HEPATIC FUNCTION PANEL
ALT: 22 U/L (ref 0–44)
AST: 20 U/L (ref 15–41)
Albumin: 4.1 g/dL (ref 3.5–5.0)
Alkaline Phosphatase: 80 U/L (ref 38–126)
Bilirubin, Direct: 0.1 mg/dL (ref 0.0–0.2)
Indirect Bilirubin: 0.3 mg/dL (ref 0.3–0.9)
Total Bilirubin: 0.4 mg/dL (ref 0.3–1.2)
Total Protein: 7.5 g/dL (ref 6.5–8.1)

## 2021-08-14 LAB — BASIC METABOLIC PANEL
Anion gap: 11 (ref 5–15)
BUN: 11 mg/dL (ref 6–20)
CO2: 23 mmol/L (ref 22–32)
Calcium: 9.3 mg/dL (ref 8.9–10.3)
Chloride: 103 mmol/L (ref 98–111)
Creatinine, Ser: 1.02 mg/dL — ABNORMAL HIGH (ref 0.44–1.00)
GFR, Estimated: >60 mL/min (ref >60)
Glucose, Bld: 86 mg/dL (ref 70–99)
Potassium: 4.9 mmol/L (ref 3.5–5.1)
Sodium: 137 mmol/L (ref 135–145)

## 2021-08-14 LAB — TROPONIN I (HIGH SENSITIVITY)
Troponin I (High Sensitivity): 2 ng/L (ref <18)
Troponin I (High Sensitivity): 3 ng/L (ref <18)

## 2021-08-14 LAB — D-DIMER, QUANTITATIVE: D-Dimer, Quant: 0.68 ug/mL-FEU — ABNORMAL HIGH (ref 0.00–0.50)

## 2021-08-14 IMAGING — DX DG CHEST 2V
2 series · 2 of 2 positions shown · non-contrast
Comparison: [DATE].

CLINICAL DATA: chest pain

EXAM:
CHEST - 2 VIEW

[chest pa]
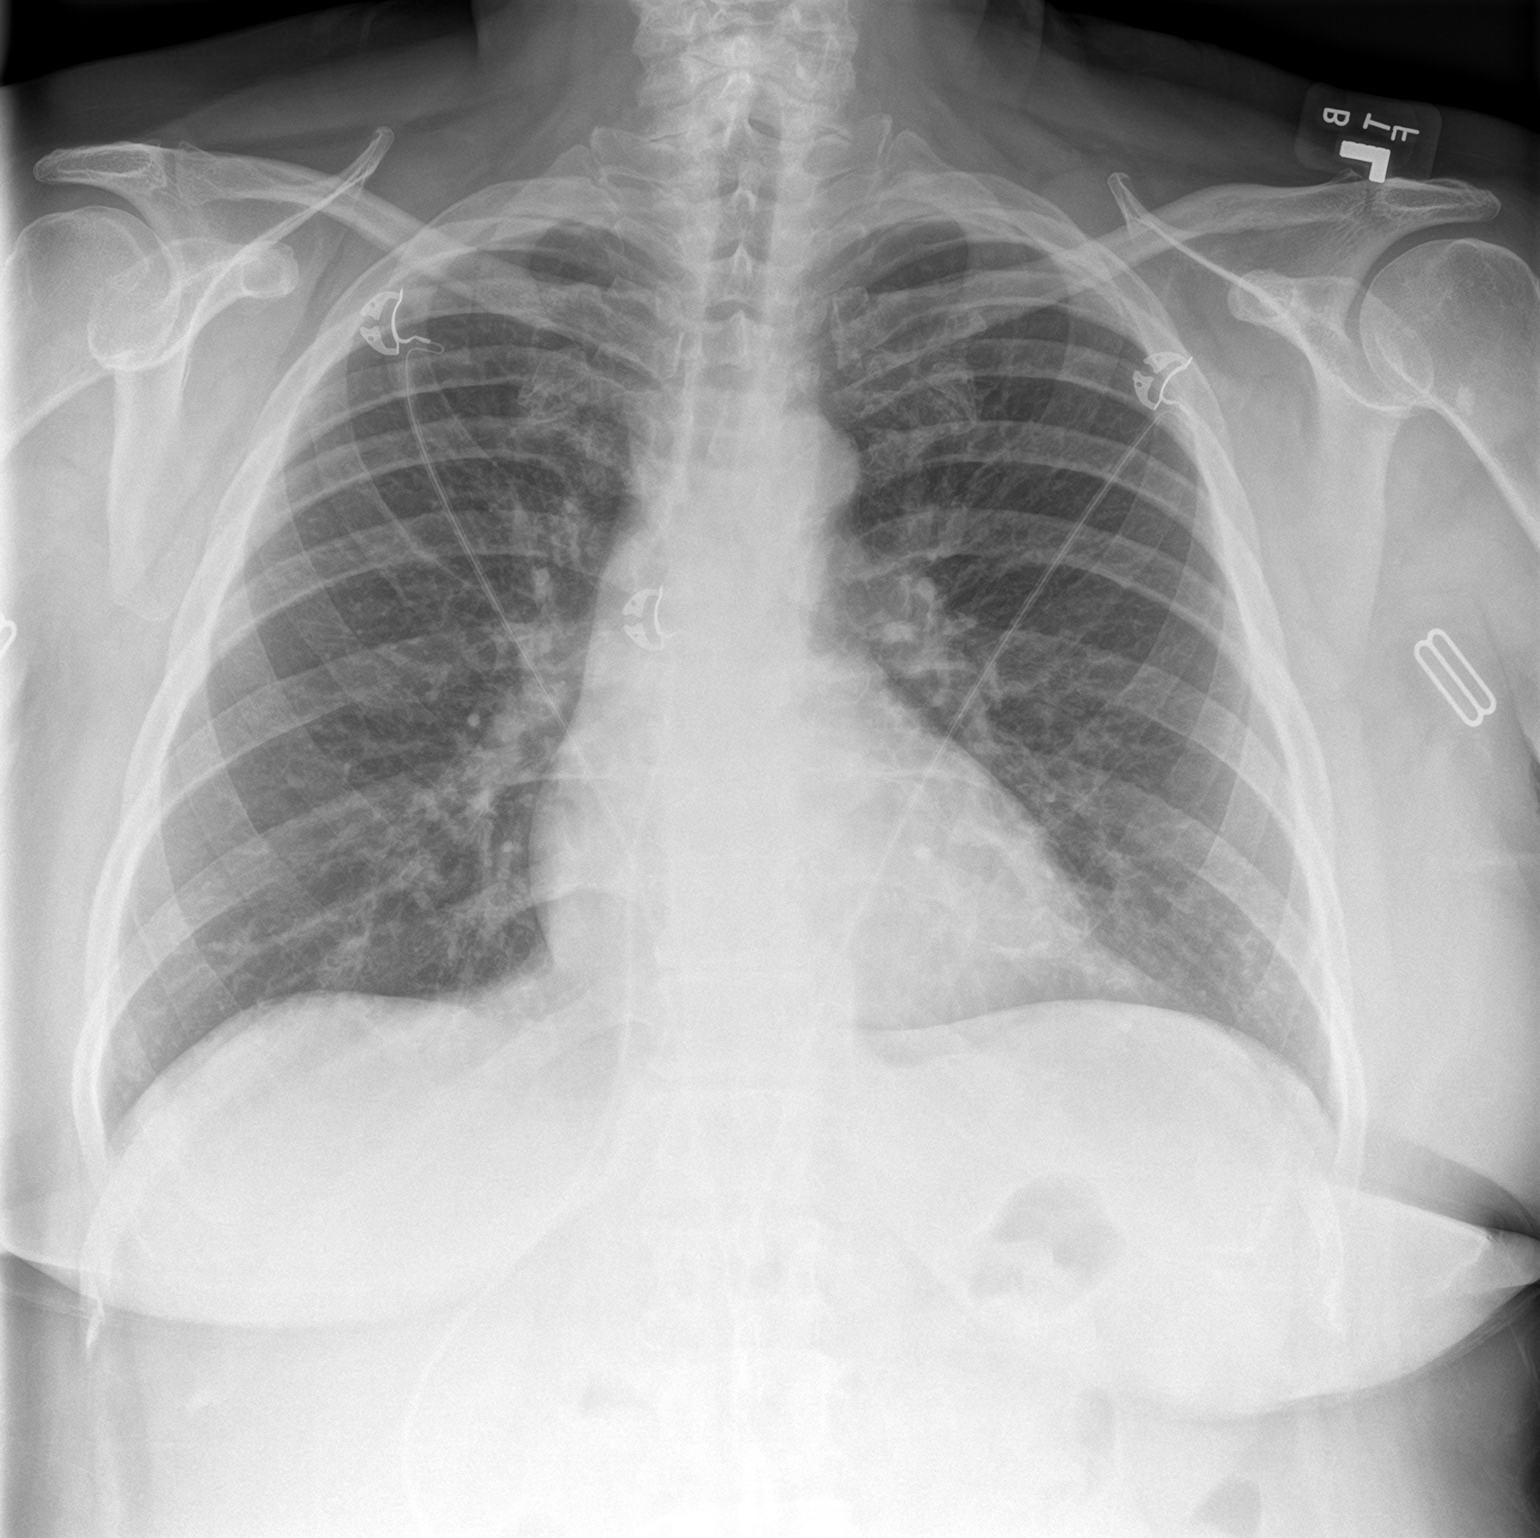

[chest lat]
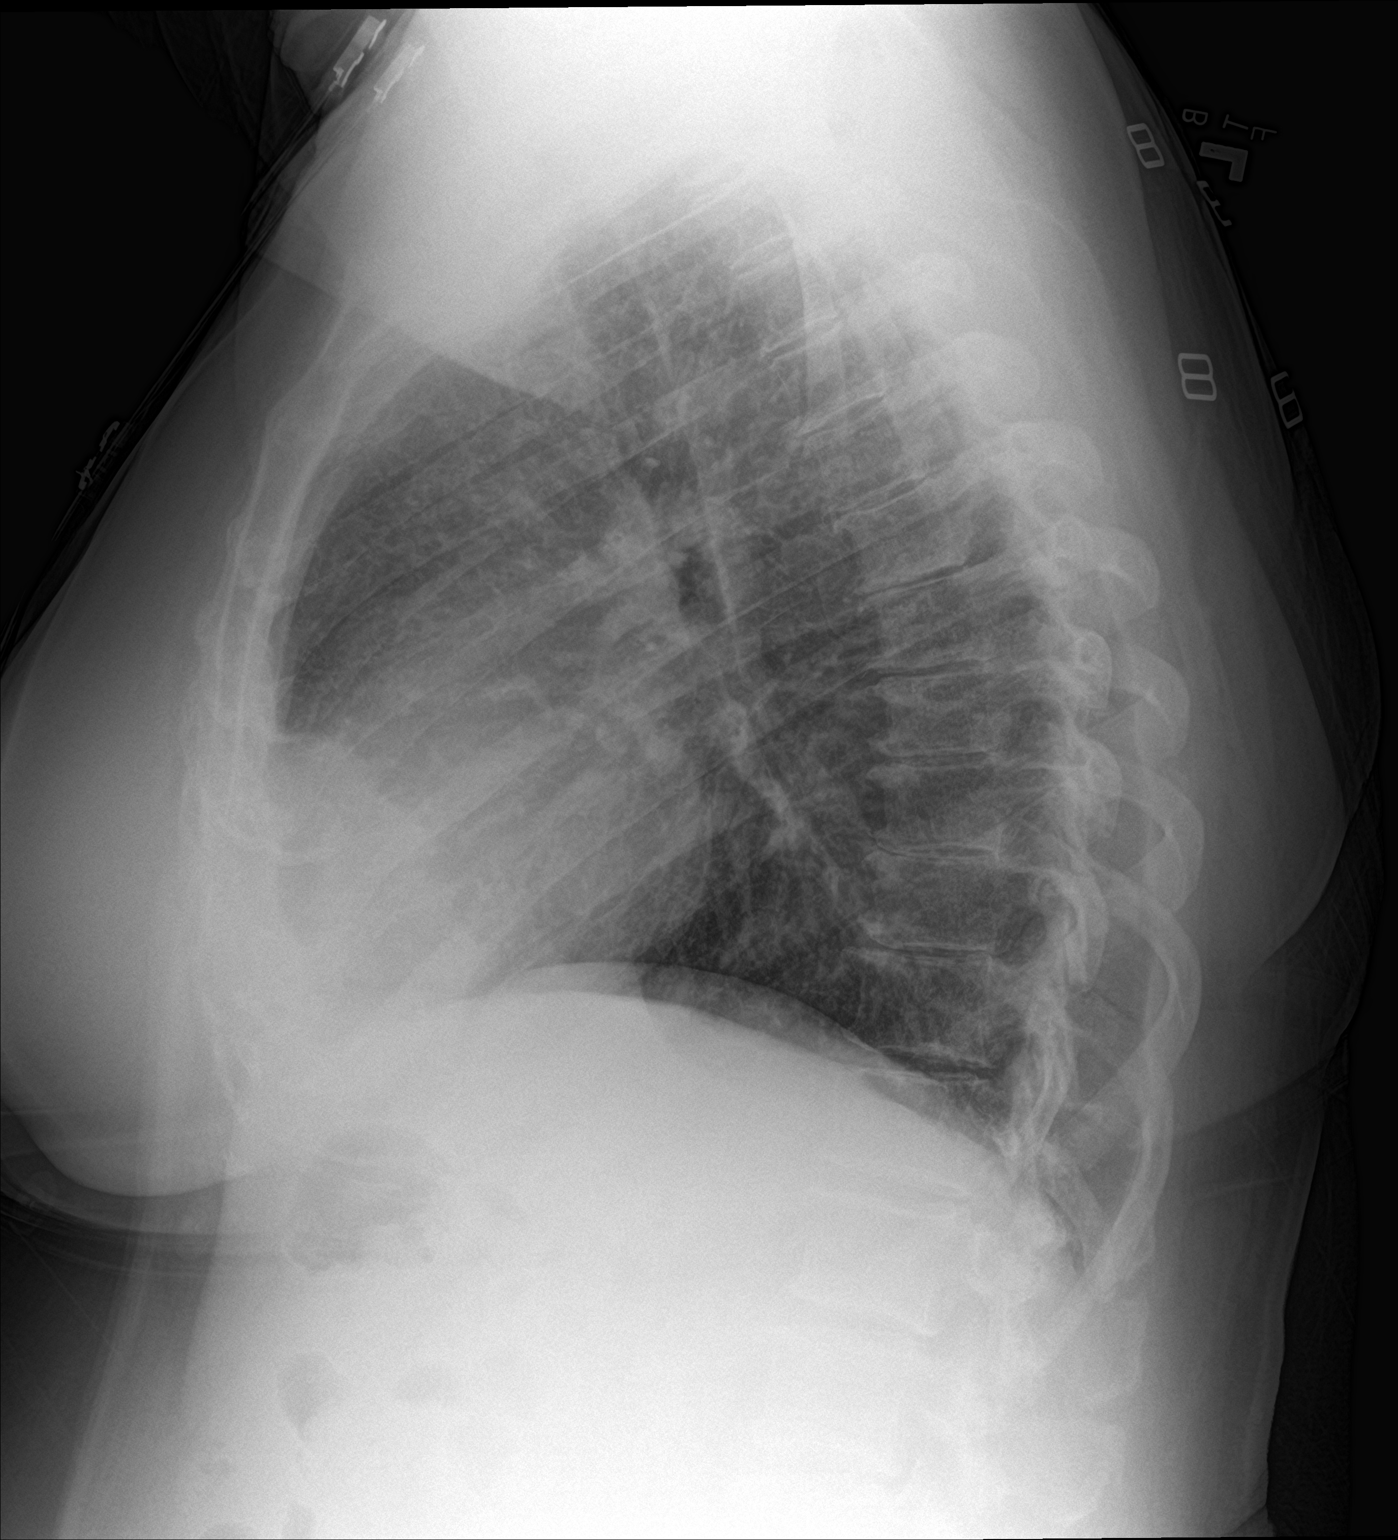

[2 of 2 positions shown; findings below may reference images not displayed]

FINDINGS: The heart size and mediastinal contours are within normal limits.
Both lungs are clear. No visible pleural effusions or pneumothorax.
No acute osseous abnormality. Thoracic spine degenerative change
IMPRESSION: No evidence of acute cardiopulmonary disease.

## 2021-08-14 IMAGING — CR DG THORACIC SPINE 2V
3 series · 3 of 3 positions shown · non-contrast
Comparison: [DATE]

CLINICAL DATA: Right sided back pain

EXAM:
THORACIC SPINE 2 VIEWS

[w thoracic spine ap (1 of 3)]
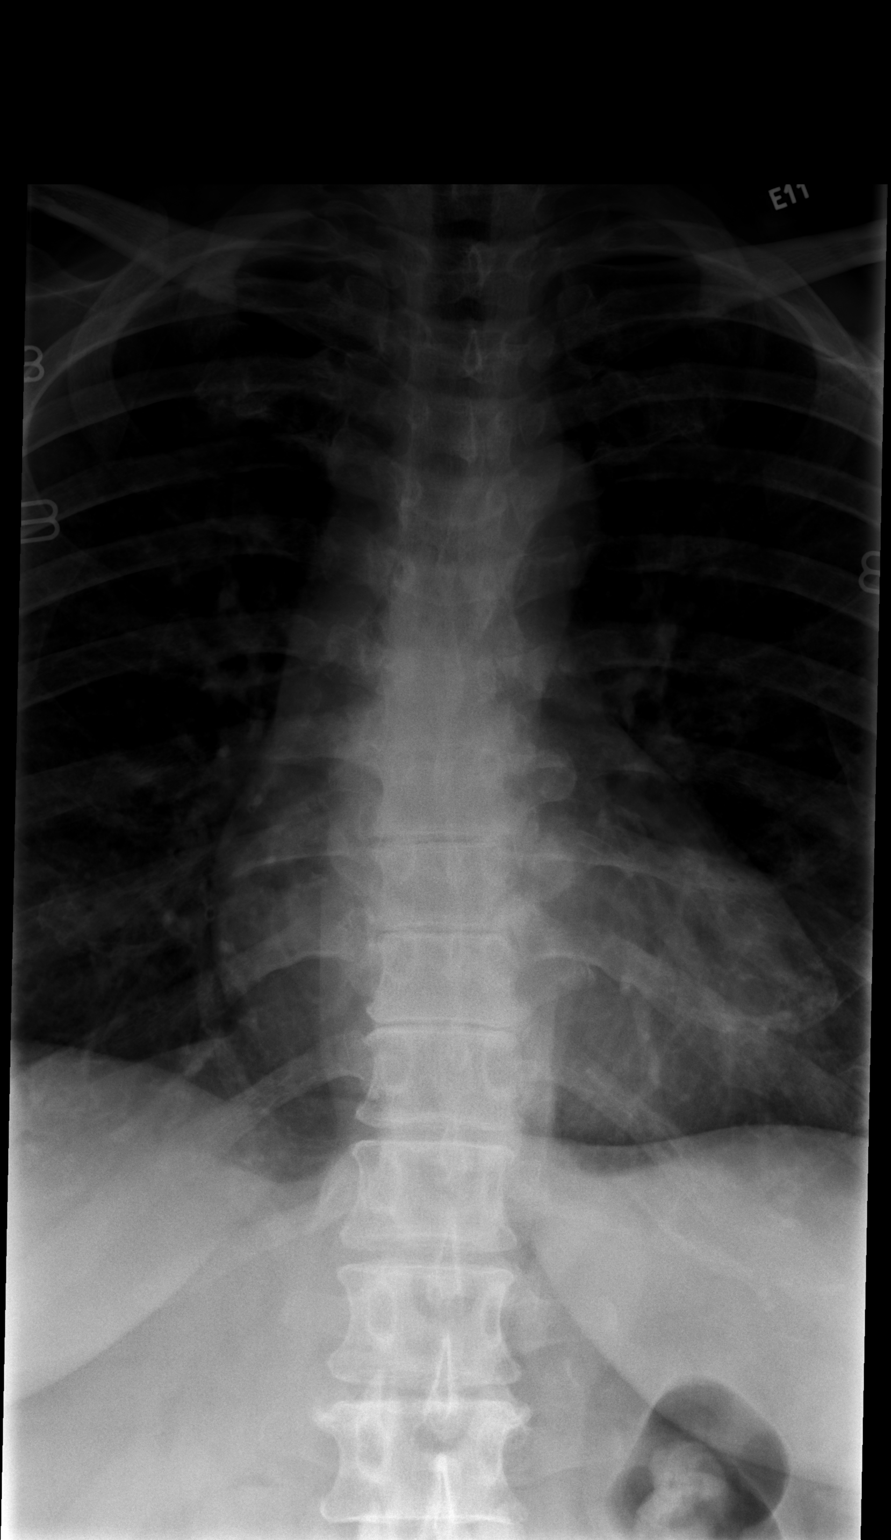

[w thoracic spine ap (2 of 3)]
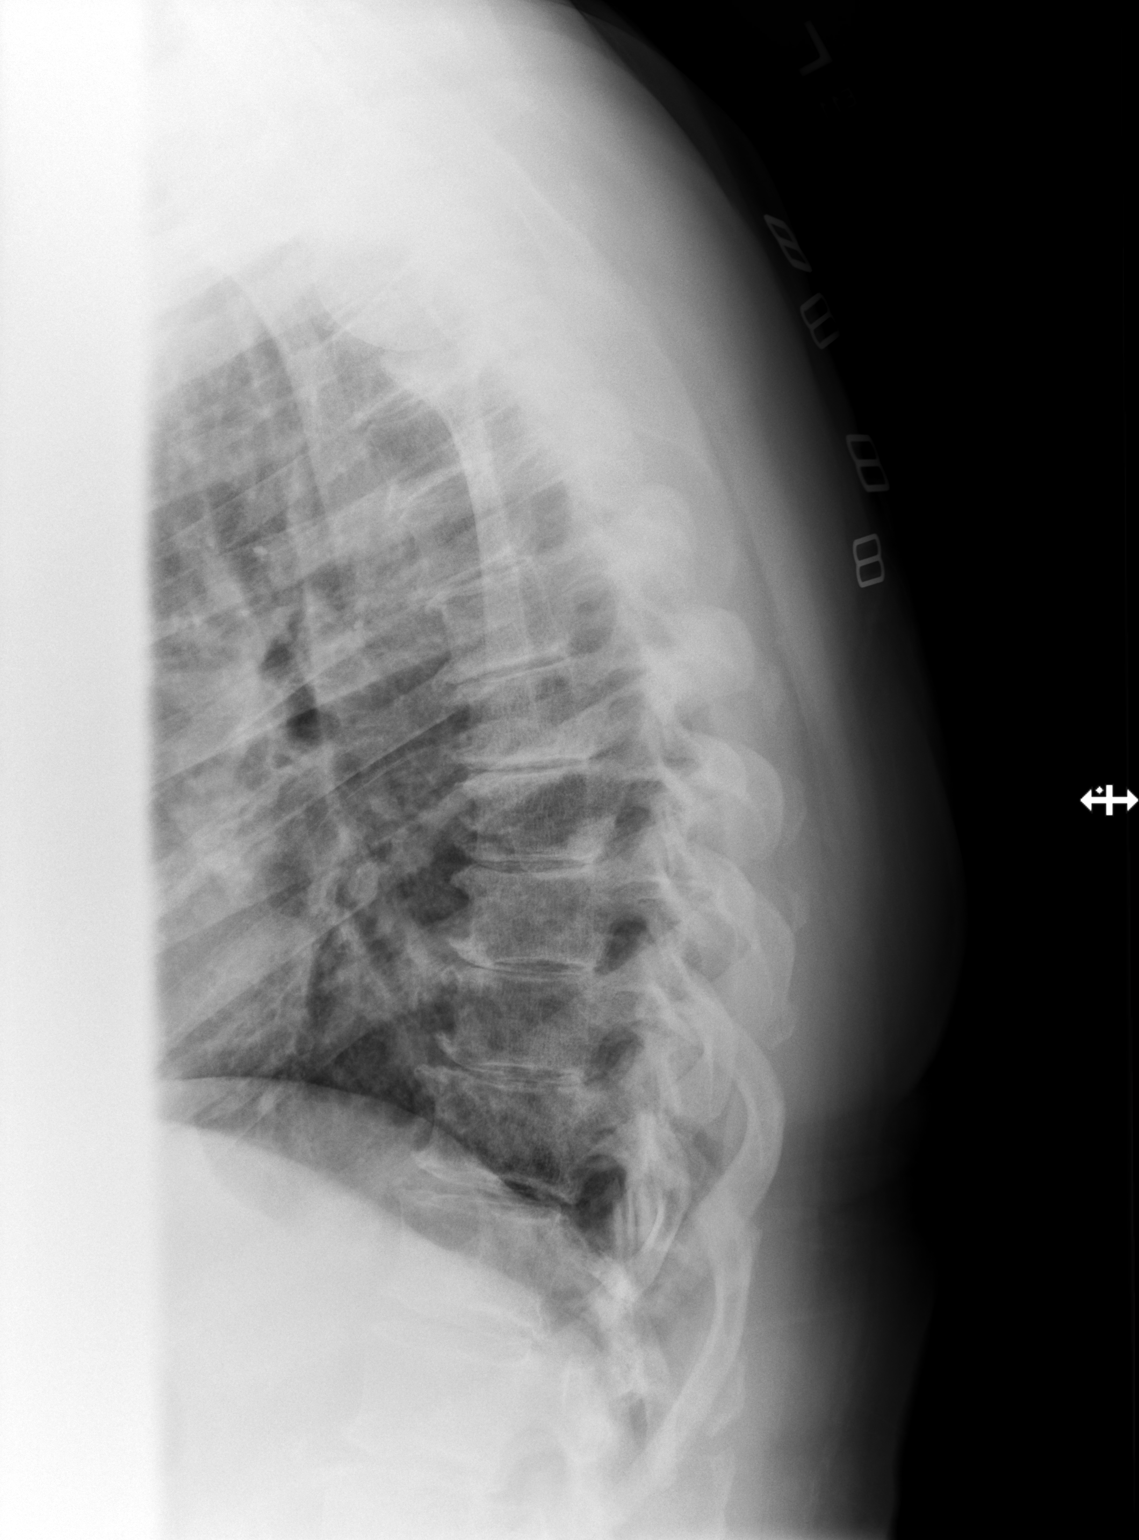

[w thoracic spine ap (3 of 3)]
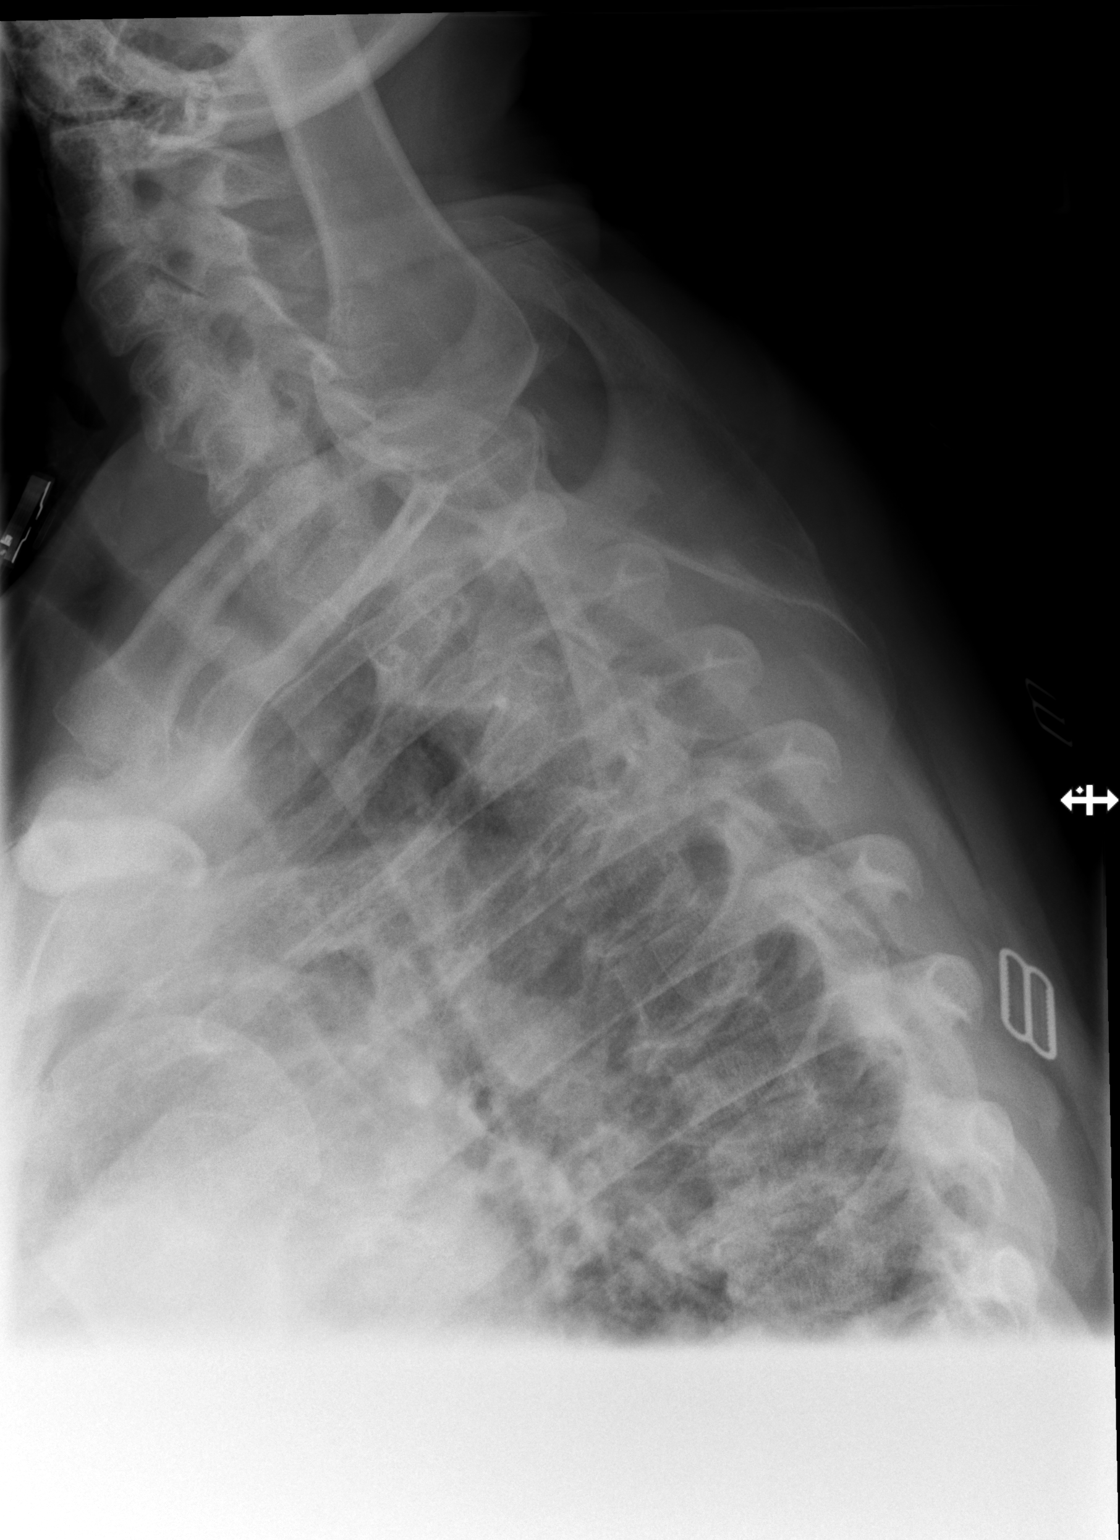

[3 of 3 positions shown; findings below may reference images not displayed]

FINDINGS: No evidence of acute fracture. No traumatic listhesis. Multilevel
intervertebral disc height loss with endplate spurring throughout
the thoracic spine, most pronounced within the midthoracic spine
IMPRESSION: Mild to moderate multilevel thoracic spondylosis. No acute findings.

## 2021-08-14 IMAGING — CT CT ANGIO CHEST
2 of 6 series · 17 of 46 positions shown · IV contrast (Omnipaque or Isovue)
Comparison: Chest x-ray from earlier in the same day, CT from
[DATE].

CLINICAL DATA: Chest pain and shortness of breath

EXAM:
CT ANGIOGRAPHY CHEST WITH CONTRAST
TECHNIQUE: Multidetector CT imaging of the chest was performed using the
standard protocol during bolus administration of intravenous
contrast. Multiplanar CT image reconstructions and MIPs were
obtained to evaluate the vascular anatomy.
CONTRAST:  100mL OMNIPAQUE IOHEXOL 350 MG/ML SOLN

[Series 5: pe axial thins · axial · 0.83mm/px · z∈[-648,-366]mm · 14 of 386 slices shown]
[im 17/386  lung]
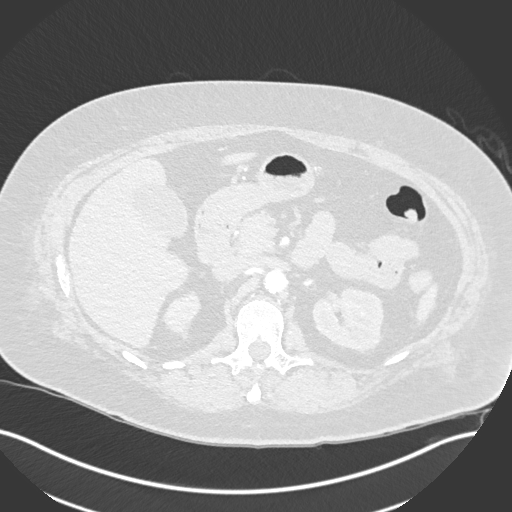
[im 49/386  soft-tissue]
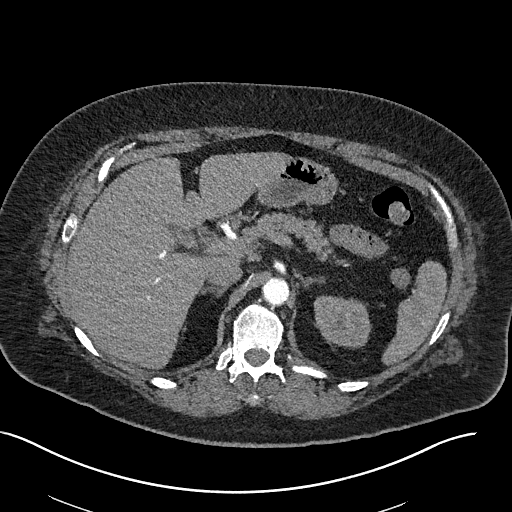
[im 81/386  lung]
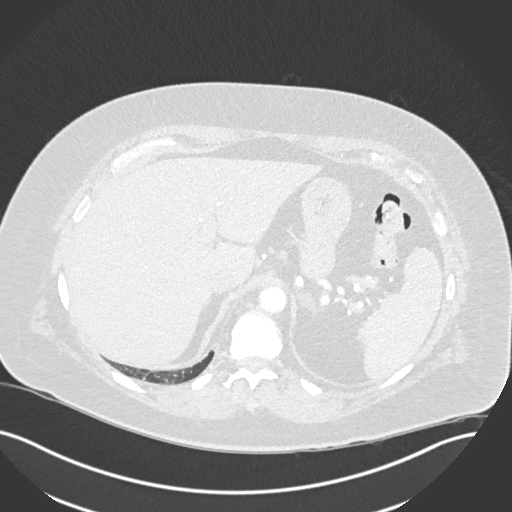
[im 97/386  soft-tissue]
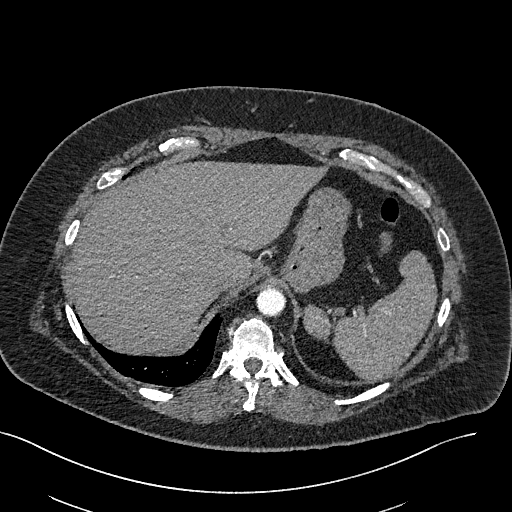
[im 129/386  lung]
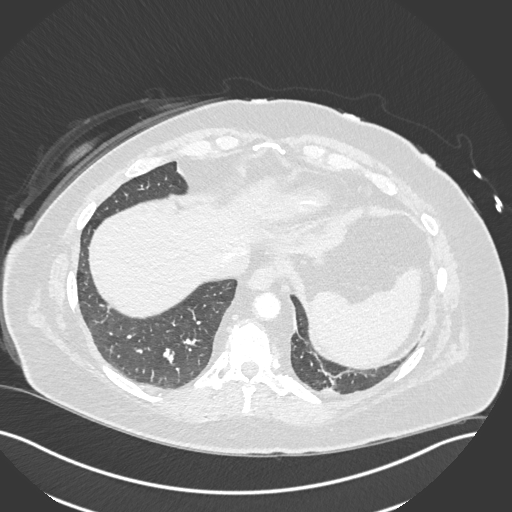
[im 161/386  soft-tissue]
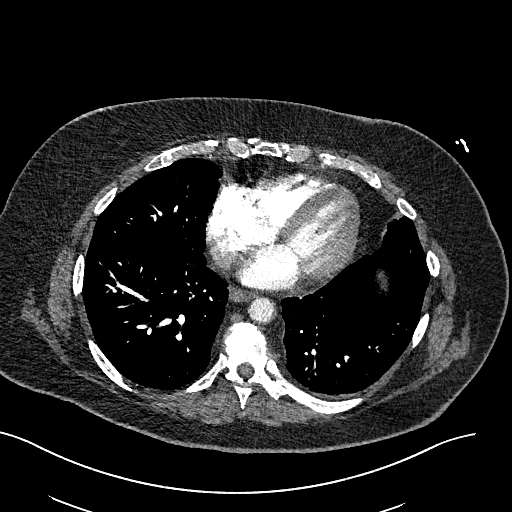
[im 177/386  lung]
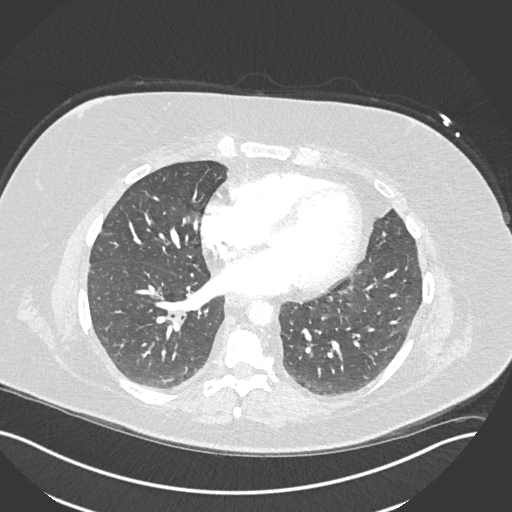
[im 209/386  soft-tissue]
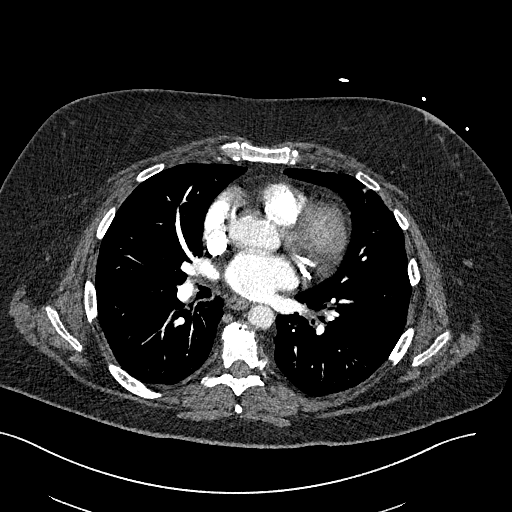
[im 225/386  lung]
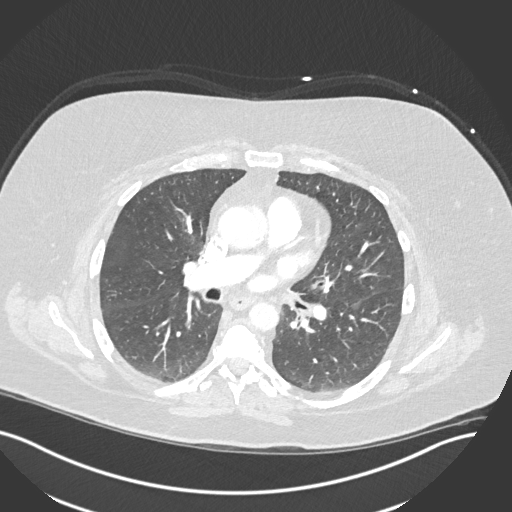
[im 257/386  soft-tissue]
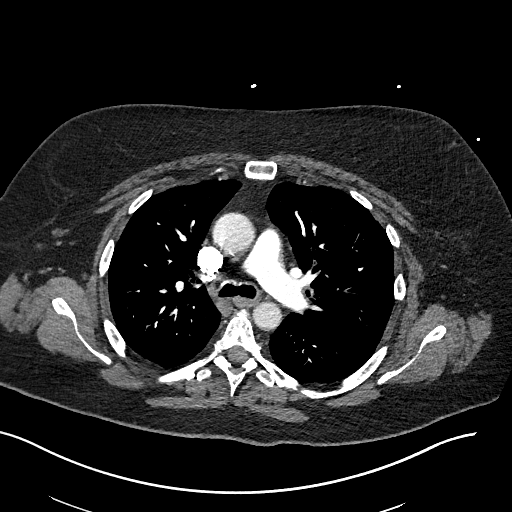
[im 289/386  lung]
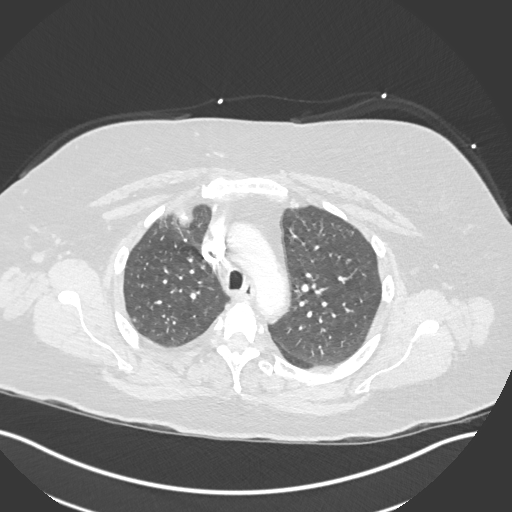
[im 305/386  soft-tissue]
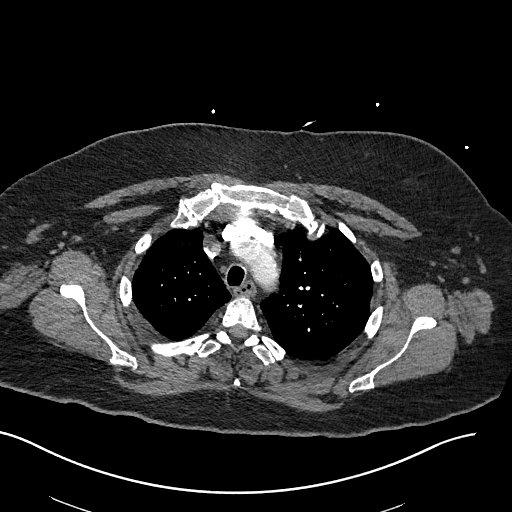
[im 337/386  lung]
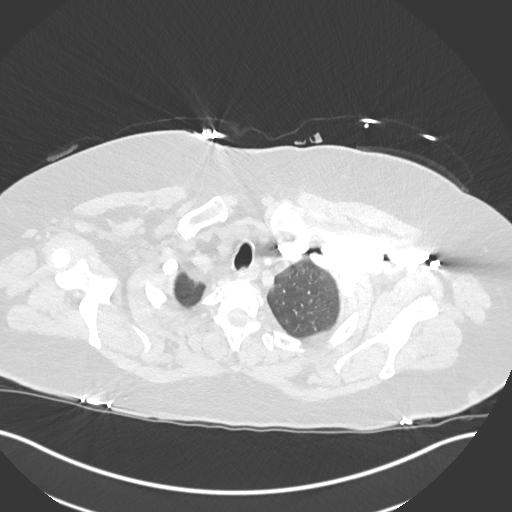
[im 369/386  soft-tissue]
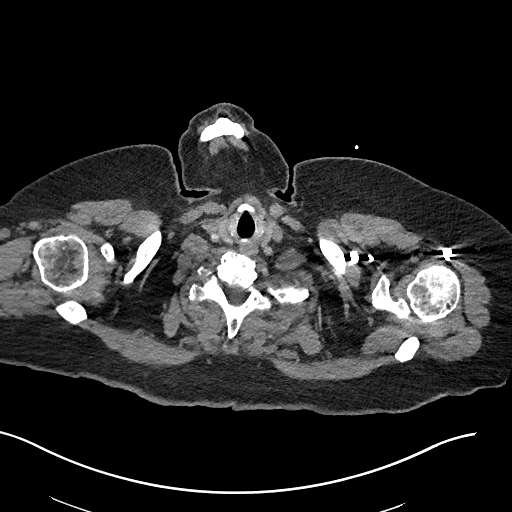

[Series 7: cor soft · coronal · 0.62mm/px · 3 of 163 slices shown]
[im 41/163  soft-tissue]
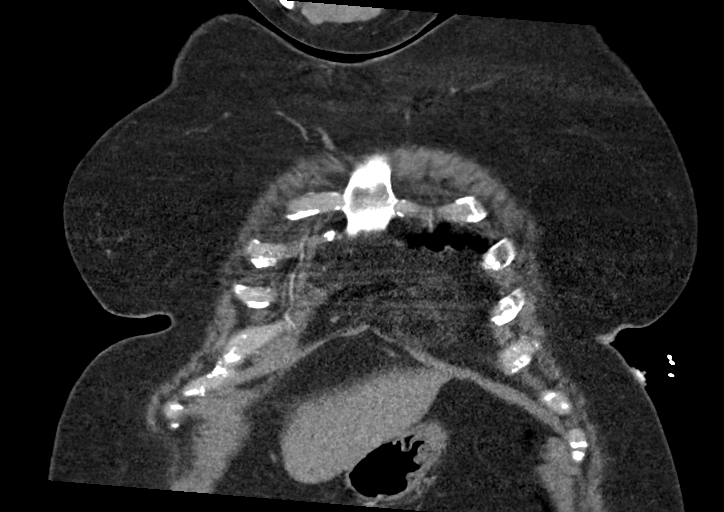
[im 82/163  soft-tissue]
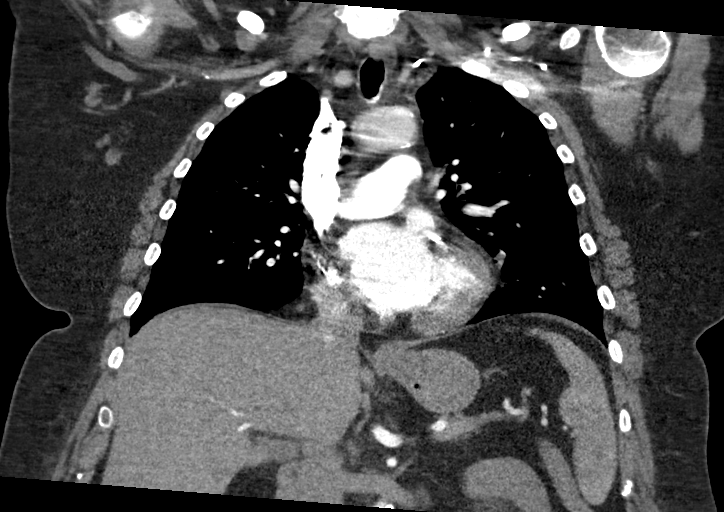
[im 122/163  soft-tissue]
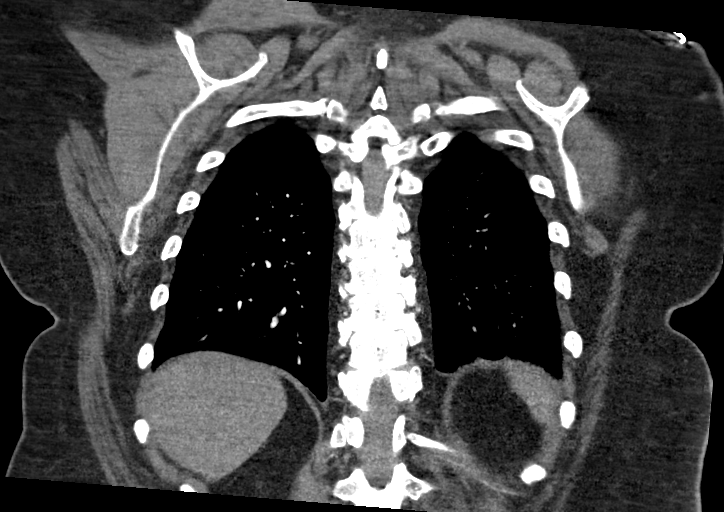

[17 of 46 positions shown; findings below may reference images not displayed]

FINDINGS: Cardiovascular: Thoracic aorta demonstrates atherosclerotic
calcifications without aneurysmal dilatation or dissection. No
cardiac enlargement is noted. Heavy coronary calcifications are
noted. The pulmonary artery shows a normal branching pattern. No
filling defect is identified to suggest pulmonary embolism.

Mediastinum/Nodes: Thoracic inlet is within normal limits. No
sizable hilar or mediastinal adenopathy is noted. The esophagus as
visualized is within normal limits.

Lungs/Pleura: There is a focal soft tissue lingular nodule
identified which measures 16 by 8 by 6 mm in greatest dimension.
This has increased in size from the prior exam at which time it
measured approximately 10 mm in greatest dimension. Some subpleural
scarring is noted. No other sizable parenchymal nodules are seen.

Upper Abdomen: Visualized upper abdomen shows a tiny hypodensity
within the right lobe of the liver best seen on image number 270 of
series 5 likely representing a small cyst. This is stable from the
prior exam.

Musculoskeletal: Degenerative changes of the thoracic spine are
noted. No acute rib abnormality is seen.

Review of the MIP images confirms the above findings.
IMPRESSION: No evidence of pulmonary emboli.

Significant coronary atherosclerotic changes.

Lingular nodule which measures 11 mm in mean diameter which has
increased in size when compared with the prior exam. Consider one of
the following in 3 months for both low-risk and high-risk
individuals: (a) repeat chest CT, (b) follow-up PET-CT, or (c)
tissue sampling. This recommendation follows the consensus
statement: Guidelines for Management of Incidental Pulmonary Nodules
Detected on CT Images: From the [HOSPITAL] [GL]; Radiology

Aortic Atherosclerosis ([GL]-[GL]).

## 2021-08-14 MED ORDER — ASPIRIN 325 MG PO TABS
325.0000 mg | ORAL_TABLET | Freq: Once | ORAL | Status: AC
  Administered 2021-08-14: 325 mg via ORAL
  Filled 2021-08-14: qty 1

## 2021-08-14 MED ORDER — LIDOCAINE 5 % EX PTCH
1.0000 | MEDICATED_PATCH | CUTANEOUS | 0 refills | Status: DC
Start: 2021-08-14 — End: 2021-08-20

## 2021-08-14 MED ORDER — IOHEXOL 350 MG/ML SOLN
100.0000 mL | Freq: Once | INTRAVENOUS | Status: AC | PRN
  Administered 2021-08-14: 100 mL via INTRAVENOUS

## 2021-08-14 MED ORDER — OXYCODONE-ACETAMINOPHEN 5-325 MG PO TABS
1.0000 | ORAL_TABLET | Freq: Once | ORAL | Status: AC
  Administered 2021-08-14: 1 via ORAL
  Filled 2021-08-14: qty 1

## 2021-08-14 MED ORDER — LIDOCAINE 5 % EX PTCH
1.0000 | MEDICATED_PATCH | CUTANEOUS | Status: DC
  Administered 2021-08-14: 1 via TRANSDERMAL
  Filled 2021-08-14: qty 1

## 2021-08-14 NOTE — Telephone Encounter (Signed)
Pt c/o of Chest Pain: STAT if CP now or developed within 24 hours  1. Are you having CP right now? yes  2. Are you experiencing any other symptoms (ex. SOB, nausea, vomiting, sweating)? sob  3. How long have you been experiencing CP? yesterday  4. Is your CP continuous or coming and going? Continuous   5. Have you taken Nitroglycerin? no ?

## 2021-08-14 NOTE — ED Provider Notes (Signed)
[facility] EMERGENCY DEPARTMENT Provider Note   CSN: [id number] Arrival date & time: 08/14/21  1236     History Chief Complaint  Patient presents with   Chest Pain    Shannon [name] is a 55 y.o. female with a history significant for asthma, CAD with MI in 2014, treated with DES, GERD, hypertension, hyperlipidemia presenting for evaluation of mid thoracic back pain which radiates into her chest and started yesterday.  She was at home doing normal daily chores including washing dishes and laundry, nothing with increased exertion when she developed gradual onset of pain in her mid thoracic region which radiates bilaterally across her shoulder blades.  Her symptoms improved by bedtime last night, but never resolved completely.  She also endorses a heavy sensation in her chest which started shortly after the initial pain onset in association with shortness of breath with exertion.  She denies peripheral edema, orthopnea, cough, nausea or vomiting, diaphoresis or palpitations.  Her symptoms are not similar to her MI symptoms which was described as chest pressure that radiated into her scalp.  Review of chart indicates her last cardiac catheterization was 3 years ago, showing mild to moderate nonobstructive disease of her LAD and RCA, widely patent stent at her proximal to mid LCx.  Additionally she had a nuclear medicine stress test 7 months ago and was a low risk study.  The history is provided by the patient.      Past Medical History:  Diagnosis Date   Anxiety    Arthritis    oa needs hip replacement on right   Asthma    allergies    Cancer ([place])    squamous cell areas removed from vulva and pre caner areas removed from leg    Carotid artery occlusion    Chronic diastolic HF (heart failure) ([place])    pt denies   Complication of anesthesia    major depression after general anesthesia   Coronary artery disease    LHC 11/25/12 90% stenosis mid LCx & otherwise nonobstructive dz w/ EF  60-65% S/p PTCA/DES to LCx   Depression    Dyspnea    with exertion    Elevated cholesterol    Exertional dyspnea    chronic   Fibromyalgia    GERD (gastroesophageal reflux disease)    History of cardiovascular stress test 06/2015   low risk   HPV in female    Hyperlipidemia    Hypertension    MI (myocardial infarction) ([place]) fe 17, 2014   Obesity    Pneumonia    hx of x 2 years ago    Polycystic ovary disease    Skin tear of upper arm without complication, left, sequela    small skin tear left upper arm no drainage for 1 week   Sleep apnea    mild no cpap    Tobacco abuse     Patient Active Problem List   Diagnosis Date Noted   Primary osteoarthritis of right hip 01/16/2021   Chronic diastolic HF (heart failure) ([place]) 08/10/2018   GERD (gastroesophageal reflux disease) 08/09/2018   Unstable angina ([place]) 08/09/2018   Carotid artery disease ([place]) 07/24/2017   AAA (abdominal aortic aneurysm) without rupture 01/05/2013   Other malaise and fatigue 01/05/2013   Palpitations 01/05/2013   Tobacco use disorder 12/02/2012   Coronary artery disease    Hyperlipidemia    Hypertension    SHORTNESS OF BREATH 09/04/2009   CHEST PAIN 09/04/2009    Past Surgical History:  Procedure Laterality Date   CORONARY ANGIOPLASTY WITH STENT PLACEMENT     LEFT HEART CATH AND CORONARY ANGIOGRAPHY N/A 08/09/2018   Procedure: LEFT HEART CATH AND CORONARY ANGIOGRAPHY;  Surgeon: Nelva Bush, MD;  Location: [place] [facility];  Service: Cardiovascular;  Laterality: N/A;   LEFT HEART CATHETERIZATION WITH CORONARY ANGIOGRAM N/A 11/15/2012   Procedure: LEFT HEART CATHETERIZATION WITH CORONARY ANGIOGRAM;  Surgeon: Thayer Headings, MD;  Location: [facility] CATH LAB;  Service: Cardiovascular;  Laterality: N/A;   PERCUTANEOUS CORONARY STENT INTERVENTION (PCI-S)  11/15/2012   Procedure: PERCUTANEOUS CORONARY STENT INTERVENTION (PCI-S);  Surgeon: Thayer Headings, MD;  Location: [facility] CATH LAB;  Service:  Cardiovascular;;   skin cancer removed right lower leg Right    TONSILLECTOMY     TOTAL HIP ARTHROPLASTY Right 01/16/2021   Procedure: TOTAL HIP ARTHROPLASTY ANTERIOR APPROACH;  Surgeon: Gaynelle Arabian, MD;  Location: [facility];  Service: Orthopedics;  Laterality: Right;  161min   uterus ablation     VULVECTOMY N/A 11/08/2019   Procedure: excision of VULVA, vulvoscopy;  Surgeon: Dian Queen, MD;  Location: [place];  Service: Gynecology;  Laterality: N/A;     OB History   No obstetric history on file.     Family History  Problem Relation Age of Onset   Depression Mother    Anxiety disorder Maternal Grandmother    Anxiety disorder Paternal Grandmother    OCD Paternal Grandmother    Depression Paternal Grandmother    Heart attack Father        Deceased, 55   Cerebral aneurysm Father    Heart disease Father     Social History   Tobacco Use   Smoking status: Every Day    Packs/day: 2.00    Years: 40.00    Pack years: 80.00    Types: Cigarettes    Start date: 03/29/1979   Smokeless tobacco: Never   Tobacco comments:    Currently 2ppd  Vaping Use   Vaping Use: Never used  Substance Use Topics   Alcohol use: Not Currently    Comment: rare   Drug use: No    Home Medications Prior to Admission medications   Medication Sig Start Date End Date Taking? Authorizing Provider  albuterol (PROVENTIL HFA;VENTOLIN HFA) 108 (90 Base) MCG/ACT inhaler Inhale 2 puffs into the lungs every 4 (four) hours as needed for wheezing or shortness of breath. 12/21/15  Yes Ward, Delice Bison, DO  ALPRAZolam (XANAX) 1 MG tablet Take 1 mg by mouth 2 (two) times daily as needed for anxiety. 06/12/17  Yes [provider]  aspirin EC 81 MG tablet Take 81 mg by mouth daily. Swallow whole.   Yes [provider]  bisoprolol-hydrochlorothiazide (ZIAC) 5-6.25 MG tablet Take 1 tablet by mouth daily. Pt takes in the pm 12/21/19  Yes [provider]  escitalopram  (LEXAPRO) 20 MG tablet Take 20 mg by mouth daily. Takes in the pm   Yes [provider]  fluticasone (FLONASE) 50 MCG/ACT nasal spray Place 2 sprays into both nostrils at bedtime.   Yes [provider]  lidocaine (LIDODERM) 5 % Place 1 patch onto the skin daily. Remove & Discard patch within 12 hours or as directed by MD 08/14/21  Yes Wong Steadham, Almyra Free, PA-C  Oxycodone HCl 10 MG TABS Take 10 mg by mouth 4 (four) times daily as needed (pain). 02/15/20  Yes [provider]  pantoprazole (PROTONIX) 40 MG tablet Take 40 mg by mouth daily.  Yes [provider]  rosuvastatin (CRESTOR) 20 MG tablet Take 1 tablet (20 mg total) by mouth at bedtime. 08/10/18  Yes Isaiah Serge, NP  triamcinolone cream (KENALOG) 0.1 % Apply 1 application topically 2 (two) times daily. Patient taking differently: Apply 1 application topically 2 (two) times daily as needed (irritation). 01/09/20  Yes Scot Jun, FNP  azithromycin (ZITHROMAX) 250 MG tablet Take 1 tablet (250 mg total) by mouth daily. Take first 2 tablets together, then 1 every day until finished. Patient not taking: No sig reported 04/05/21   Wurst, [place], PA-C  benzonatate (TESSALON) 100 MG capsule Take 1 capsule (100 mg total) by mouth every 8 (eight) hours. Patient not taking: No sig reported 04/05/21   Wurst, [place], PA-C  HYDROmorphone (DILAUDID) 2 MG tablet Take 1-2 tablets (2-4 mg total) by mouth every 6 (six) hours as needed for severe pain. Patient not taking: No sig reported 01/17/21   Fenton Foy D, PA-C  methocarbamol (ROBAXIN) 500 MG tablet Take 1 tablet (500 mg total) by mouth every 6 (six) hours as needed for muscle spasms. Patient not taking: No sig reported 01/17/21   Fenton Foy D, PA-C  nitroGLYCERIN (NITROSTAT) 0.4 MG SL tablet Place 1 tablet (0.4 mg total) under the tongue every 5 (five) minutes as needed for chest pain (up to 3 doses). 07/03/21   Ahmed Prima, Fransisco Hertz, PA-C    Allergies     Clomiphene citrate, Morphine and related, Sulfa antibiotics, and Clomiphene  Review of Systems   Review of Systems  Constitutional:  Negative for chills, diaphoresis and fever.  HENT:  Negative for congestion and sore throat.   Eyes: Negative.   Respiratory:  Positive for shortness of breath. Negative for chest tightness.   Cardiovascular:  Positive for chest pain. Negative for palpitations and leg swelling.  Gastrointestinal:  Negative for abdominal pain and nausea.  Genitourinary: Negative.   Musculoskeletal:  Positive for back pain. Negative for arthralgias, joint swelling and neck pain.  Skin: Negative.  Negative for rash and wound.  Neurological:  Negative for dizziness, weakness, light-headedness, numbness and headaches.  Psychiatric/Behavioral: Negative.     Physical Exam Updated Vital Signs BP 140/78 (BP Location: Right Arm)   Pulse 67   Temp 98 F (36.7 C) (Oral)   Resp 20   Ht 5\' 4"  (1.626 m)   Wt 104.3 kg   SpO2 99%   BMI 39.48 kg/m   Physical Exam Vitals and nursing note reviewed.  Constitutional:      Appearance: She is well-developed.  HENT:     Head: Normocephalic and atraumatic.  Eyes:     Conjunctiva/sclera: Conjunctivae normal.  Cardiovascular:     Rate and Rhythm: Normal rate and regular rhythm.     Heart sounds: Normal heart sounds.  Pulmonary:     Effort: Pulmonary effort is normal.     Breath sounds: Normal breath sounds. No wheezing.  Abdominal:     General: Bowel sounds are normal.     Palpations: Abdomen is soft.     Tenderness: There is no abdominal tenderness.  Musculoskeletal:        General: Normal range of motion.     Cervical back: Normal range of motion.     Right lower leg: No tenderness. No edema.     Left lower leg: No tenderness. No edema.  Skin:    General: Skin is warm and dry.  Neurological:     General: No focal deficit present.  Mental Status: She is alert.    ED Results / Procedures / Treatments   Labs (all  labs ordered are listed, but only abnormal results are displayed) Labs Reviewed  BASIC METABOLIC PANEL - Abnormal; Notable for the following components:      Result Value   Creatinine, Ser 1.02 (*)    All other components within normal limits  CBC - Abnormal; Notable for the following components:   RBC 5.39 (*)    Hemoglobin 16.2 (*)    HCT 48.6 (*)    Platelets 132 (*)    All other components within normal limits  D-DIMER, QUANTITATIVE (NOT AT [facility]) - Abnormal; Notable for the following components:   D-Dimer, Quant 0.68 (*)    All other components within normal limits  HEPATIC FUNCTION PANEL  TROPONIN I (HIGH SENSITIVITY)  TROPONIN I (HIGH SENSITIVITY)    EKG EKG Interpretation  Date/Time:  Wednesday August 14 2021 12:44:38 EST Ventricular Rate:  62 PR Interval:  164 QRS Duration: 84 QT Interval:  406 QTC Calculation: 412 R Axis:   70 Text Interpretation: Normal sinus rhythm Normal ECG No significant change since last tracing Confirmed by Calvert Cantor [phone]) on 08/14/2021 2:10:47 PM  Radiology DG Chest 2 View  Result Date: 08/14/2021 CLINICAL DATA:  chest pain EXAM: CHEST - 2 VIEW COMPARISON:  November 15, 2020. FINDINGS: The heart size and mediastinal contours are within normal limits. Both lungs are clear. No visible pleural effusions or pneumothorax. No acute osseous abnormality. Thoracic spine degenerative change IMPRESSION: No evidence of acute cardiopulmonary disease. Electronically Signed   By: Margaretha Sheffield M.D.   On: 08/14/2021 13:56   DG Thoracic Spine 2 View  Result Date: 08/14/2021 CLINICAL DATA:  Right sided back pain EXAM: THORACIC SPINE 2 VIEWS COMPARISON:  11/15/2020 FINDINGS: No evidence of acute fracture. No traumatic listhesis. Multilevel intervertebral disc height loss with endplate spurring throughout the thoracic spine, most pronounced within the midthoracic spine IMPRESSION: Mild to moderate multilevel thoracic spondylosis. No acute  findings. Electronically Signed   By: Davina Poke D.O.   On: 08/14/2021 15:15   CT Angio Chest PE W and/or Wo Contrast  Result Date: 08/14/2021 CLINICAL DATA:  Chest pain and shortness of breath EXAM: CT ANGIOGRAPHY CHEST WITH CONTRAST TECHNIQUE: Multidetector CT imaging of the chest was performed using the standard protocol during bolus administration of intravenous contrast. Multiplanar CT image reconstructions and MIPs were obtained to evaluate the vascular anatomy. CONTRAST:  166mL OMNIPAQUE IOHEXOL 350 MG/ML SOLN COMPARISON:  Chest x-ray from earlier in the same day, CT from 12/18/2019. FINDINGS: Cardiovascular: Thoracic aorta demonstrates atherosclerotic calcifications without aneurysmal dilatation or dissection. No cardiac enlargement is noted. Heavy coronary calcifications are noted. The pulmonary artery shows a normal branching pattern. No filling defect is identified to suggest pulmonary embolism. Mediastinum/Nodes: Thoracic inlet is within normal limits. No sizable hilar or mediastinal adenopathy is noted. The esophagus as visualized is within normal limits. Lungs/Pleura: There is a focal soft tissue lingular nodule identified which measures 16 by 8 by 6 mm in greatest dimension. This has increased in size from the prior exam at which time it measured approximately 10 mm in greatest dimension. Some subpleural scarring is noted. No other sizable parenchymal nodules are seen. Upper Abdomen: Visualized upper abdomen shows a tiny hypodensity within the right lobe of the liver best seen on image number 270 of series 5 likely representing a small cyst. This is stable from the prior exam. Musculoskeletal: Degenerative changes of the thoracic  spine are noted. No acute rib abnormality is seen. Review of the MIP images confirms the above findings. IMPRESSION: No evidence of pulmonary emboli. Significant coronary atherosclerotic changes. Lingular nodule which measures 11 mm in mean diameter which has  increased in size when compared with the prior exam. Consider one of the following in 3 months for both low-risk and high-risk individuals: (a) repeat chest CT, (b) follow-up PET-CT, or (c) tissue sampling. This recommendation follows the consensus statement: Guidelines for Management of Incidental Pulmonary Nodules Detected on CT Images: From the Fleischner Society 2017; Radiology 2017; 284:228-243. Aortic Atherosclerosis (ICD10-I70.0). Electronically Signed   By: Inez Catalina M.D.   On: 08/14/2021 17:35    Procedures Procedures   Medications Ordered in ED Medications  lidocaine (LIDODERM) 5 % 1 patch (1 patch Transdermal Patch Applied 08/14/21 1833)  aspirin tablet 325 mg (325 mg Oral Given 08/14/21 1433)  oxyCODONE-acetaminophen (PERCOCET/ROXICET) 5-325 MG per tablet 1 tablet (1 tablet Oral Given 08/14/21 1712)  iohexol (OMNIPAQUE) 350 MG/ML injection 100 mL (100 mLs Intravenous Contrast Given 08/14/21 1718)    ED Course  I have reviewed the triage vital signs and the nursing notes.  Pertinent labs & imaging results that were available during my care of the patient were reviewed by me and considered in my medical decision making (see chart for details).    MDM Rules/Calculators/A&P                           Patient with chronic midline thoracic back pain with known osteoarthritis, exacerbation of this pain but with radiation into her chest.  Labs, imaging and exam suggest that this is body wall pain as she is tender to palpation midline mid thoracic suggesting musculoskeletal origin of symptoms.  Her delta troponins are negative, EKG is stable.  Doubt unstable angina.  She did have a slight bump in her D-dimer therefore CT imaging was performed and was negative for PE, also negative for aortic dissection.  Her CT does reveal a enlarged lung nodule in comparison to prior CT from March 2021.  This was discussed with patient and recommendation is for repeat CT imaging in 3 months which she  should discuss with her PCP to arrange.  She understands the need to have this repeat testing.  She is already in chronic pain management, uses oxycodone as needed.  I have added a Lidoderm patch for her midthoracic pain location. Final Clinical Impression(s) / ED Diagnoses Final diagnoses:  Atypical chest pain  Chronic midline thoracic back pain    Rx / DC Orders ED Discharge Orders          Ordered    lidocaine (LIDODERM) 5 %  Every 24 hours        08/14/21 1821             Evalee Jefferson, PA-C 08/14/21 2337    Truddie Hidden, MD 08/15/21 [phone]

## 2021-08-14 NOTE — ED Triage Notes (Signed)
Chest pain into shoulder, hx 2014 of MI, skin warm and dry, started yesterday.  Pt alert and oriented.

## 2021-08-14 NOTE — Telephone Encounter (Signed)
Pt stated that she started having cp on 11/15 and it continued into today. Pt states that pain started in the upper middle part of her back between her shoulder blades and felt like it was radiating around to her chest. Pt also c/o headache thoracic pressure and sob. Pt advised to seek emergent ER evaluation. Pt verbalized understanding. Will fwd to provider for recommendations.

## 2021-08-14 NOTE — Discharge Instructions (Addendum)
Your lab tests today and ekg are stable and reassuring.  I suspect your pain is related to your known thoracic arthritis.  You have been given a pain patch to use in addition to your home oxycodone.  If this pain patch is helpful you can get the prescription filled.  As discussed, you do have a lung nodule that is a little bit larger than on a prior CT scan from March 2021.  It is recommended that you have a repeat CT scan in 3 months.  Call your primary doctor to discuss this and have this test scheduled.

## 2021-08-14 NOTE — ED Notes (Signed)
Transported to xray 

## 2021-08-16 ENCOUNTER — Encounter (HOSPITAL_COMMUNITY): Payer: Self-pay | Admitting: *Deleted

## 2021-08-16 ENCOUNTER — Ambulatory Visit
Admission: EM | Admit: 2021-08-16 | Discharge: 2021-08-16 | Disposition: A | Discharge status: Other Acute Inpatient Hospital | Payer: Medicare HMO

## 2021-08-16 ENCOUNTER — Inpatient Hospital Stay (HOSPITAL_COMMUNITY)
Admission: EM | Admit: 2021-08-16 | Discharge: 2021-08-20 | DRG: 442 | Disposition: A | Discharge status: Home Health | Payer: Medicare HMO | Source: Ambulatory Visit | Attending: Internal Medicine | Admitting: Internal Medicine

## 2021-08-16 ENCOUNTER — Other Ambulatory Visit: Payer: Self-pay

## 2021-08-16 DIAGNOSIS — F17210 Nicotine dependence, cigarettes, uncomplicated: Secondary | ICD-10-CM | POA: Diagnosis present

## 2021-08-16 DIAGNOSIS — J45909 Unspecified asthma, uncomplicated: Secondary | ICD-10-CM

## 2021-08-16 DIAGNOSIS — Z7982 Long term (current) use of aspirin: Secondary | ICD-10-CM

## 2021-08-16 DIAGNOSIS — E669 Obesity, unspecified: Secondary | ICD-10-CM

## 2021-08-16 DIAGNOSIS — Z882 Allergy status to sulfonamides status: Secondary | ICD-10-CM

## 2021-08-16 DIAGNOSIS — I2510 Atherosclerotic heart disease of native coronary artery without angina pectoris: Secondary | ICD-10-CM | POA: Diagnosis present

## 2021-08-16 DIAGNOSIS — R0789 Other chest pain: Secondary | ICD-10-CM

## 2021-08-16 DIAGNOSIS — R531 Weakness: Secondary | ICD-10-CM | POA: Diagnosis not present

## 2021-08-16 DIAGNOSIS — F419 Anxiety disorder, unspecified: Secondary | ICD-10-CM | POA: Diagnosis present

## 2021-08-16 DIAGNOSIS — I252 Old myocardial infarction: Secondary | ICD-10-CM

## 2021-08-16 DIAGNOSIS — E876 Hypokalemia: Secondary | ICD-10-CM

## 2021-08-16 DIAGNOSIS — R001 Bradycardia, unspecified: Secondary | ICD-10-CM | POA: Diagnosis present

## 2021-08-16 DIAGNOSIS — E871 Hypo-osmolality and hyponatremia: Secondary | ICD-10-CM | POA: Diagnosis present

## 2021-08-16 DIAGNOSIS — Z888 Allergy status to other drugs, medicaments and biological substances status: Secondary | ICD-10-CM

## 2021-08-16 DIAGNOSIS — E782 Mixed hyperlipidemia: Secondary | ICD-10-CM | POA: Diagnosis present

## 2021-08-16 DIAGNOSIS — R079 Chest pain, unspecified: Secondary | ICD-10-CM | POA: Diagnosis present

## 2021-08-16 DIAGNOSIS — Z955 Presence of coronary angioplasty implant and graft: Secondary | ICD-10-CM | POA: Diagnosis not present

## 2021-08-16 DIAGNOSIS — I25118 Atherosclerotic heart disease of native coronary artery with other forms of angina pectoris: Secondary | ICD-10-CM | POA: Diagnosis not present

## 2021-08-16 DIAGNOSIS — R7989 Other specified abnormal findings of blood chemistry: Secondary | ICD-10-CM

## 2021-08-16 DIAGNOSIS — R7881 Bacteremia: Secondary | ICD-10-CM

## 2021-08-16 DIAGNOSIS — K750 Abscess of liver: Secondary | ICD-10-CM | POA: Diagnosis present

## 2021-08-16 DIAGNOSIS — Z20822 Contact with and (suspected) exposure to covid-19: Secondary | ICD-10-CM | POA: Diagnosis present

## 2021-08-16 DIAGNOSIS — Z6839 Body mass index (BMI) 39.0-39.9, adult: Secondary | ICD-10-CM

## 2021-08-16 DIAGNOSIS — R911 Solitary pulmonary nodule: Secondary | ICD-10-CM | POA: Diagnosis present

## 2021-08-16 DIAGNOSIS — K7689 Other specified diseases of liver: Secondary | ICD-10-CM | POA: Diagnosis not present

## 2021-08-16 DIAGNOSIS — Z85828 Personal history of other malignant neoplasm of skin: Secondary | ICD-10-CM

## 2021-08-16 DIAGNOSIS — R778 Other specified abnormalities of plasma proteins: Secondary | ICD-10-CM

## 2021-08-16 DIAGNOSIS — Z8249 Family history of ischemic heart disease and other diseases of the circulatory system: Secondary | ICD-10-CM

## 2021-08-16 DIAGNOSIS — Z96641 Presence of right artificial hip joint: Secondary | ICD-10-CM | POA: Diagnosis present

## 2021-08-16 DIAGNOSIS — D696 Thrombocytopenia, unspecified: Secondary | ICD-10-CM | POA: Diagnosis present

## 2021-08-16 DIAGNOSIS — R5383 Other fatigue: Secondary | ICD-10-CM

## 2021-08-16 DIAGNOSIS — I959 Hypotension, unspecified: Secondary | ICD-10-CM

## 2021-08-16 DIAGNOSIS — I248 Other forms of acute ischemic heart disease: Secondary | ICD-10-CM | POA: Diagnosis present

## 2021-08-16 DIAGNOSIS — I10 Essential (primary) hypertension: Secondary | ICD-10-CM

## 2021-08-16 DIAGNOSIS — N3289 Other specified disorders of bladder: Secondary | ICD-10-CM | POA: Diagnosis not present

## 2021-08-16 DIAGNOSIS — J4520 Mild intermittent asthma, uncomplicated: Secondary | ICD-10-CM | POA: Diagnosis not present

## 2021-08-16 DIAGNOSIS — B961 Klebsiella pneumoniae [K. pneumoniae] as the cause of diseases classified elsewhere: Secondary | ICD-10-CM | POA: Diagnosis present

## 2021-08-16 DIAGNOSIS — K219 Gastro-esophageal reflux disease without esophagitis: Secondary | ICD-10-CM | POA: Diagnosis present

## 2021-08-16 DIAGNOSIS — Z885 Allergy status to narcotic agent status: Secondary | ICD-10-CM

## 2021-08-16 DIAGNOSIS — M47814 Spondylosis without myelopathy or radiculopathy, thoracic region: Secondary | ICD-10-CM | POA: Diagnosis present

## 2021-08-16 DIAGNOSIS — R509 Fever, unspecified: Secondary | ICD-10-CM | POA: Diagnosis not present

## 2021-08-16 DIAGNOSIS — R739 Hyperglycemia, unspecified: Secondary | ICD-10-CM

## 2021-08-16 LAB — COMPREHENSIVE METABOLIC PANEL
ALT: 21 U/L (ref 0–44)
AST: 26 U/L (ref 15–41)
Albumin: 3.8 g/dL (ref 3.5–5.0)
Alkaline Phosphatase: 68 U/L (ref 38–126)
Anion gap: 12 (ref 5–15)
BUN: 16 mg/dL (ref 6–20)
CO2: 22 mmol/L (ref 22–32)
Calcium: 8.8 mg/dL — ABNORMAL LOW (ref 8.9–10.3)
Chloride: 99 mmol/L (ref 98–111)
Creatinine, Ser: 1.17 mg/dL — ABNORMAL HIGH (ref 0.44–1.00)
GFR, Estimated: 54 mL/min — ABNORMAL LOW (ref >60)
Glucose, Bld: 160 mg/dL — ABNORMAL HIGH (ref 70–99)
Potassium: 3.4 mmol/L — ABNORMAL LOW (ref 3.5–5.1)
Sodium: 133 mmol/L — ABNORMAL LOW (ref 135–145)
Total Bilirubin: 1.3 mg/dL — ABNORMAL HIGH (ref 0.3–1.2)
Total Protein: 7.7 g/dL (ref 6.5–8.1)

## 2021-08-16 LAB — URINALYSIS, ROUTINE W REFLEX MICROSCOPIC
Bilirubin Urine: NEGATIVE
Glucose, UA: NEGATIVE mg/dL
Ketones, ur: 5 mg/dL — AB
Leukocytes,Ua: NEGATIVE
Nitrite: NEGATIVE
Protein, ur: 30 mg/dL — AB
Specific Gravity, Urine: 1.025 (ref 1.005–1.030)
pH: 5 (ref 5.0–8.0)

## 2021-08-16 LAB — CBC WITH DIFFERENTIAL/PLATELET
Abs Immature Granulocytes: 0.04 10*3/uL (ref 0.00–0.07)
Basophils Absolute: 0 10*3/uL (ref 0.0–0.1)
Basophils Relative: 0 %
Eosinophils Absolute: 0 10*3/uL (ref 0.0–0.5)
Eosinophils Relative: 0 %
HCT: 46.9 % — ABNORMAL HIGH (ref 36.0–46.0)
Hemoglobin: 15.5 g/dL — ABNORMAL HIGH (ref 12.0–15.0)
Immature Granulocytes: 1 %
Lymphocytes Relative: 5 %
Lymphs Abs: 0.4 10*3/uL — ABNORMAL LOW (ref 0.7–4.0)
MCH: 29.6 pg (ref 26.0–34.0)
MCHC: 33 g/dL (ref 30.0–36.0)
MCV: 89.7 fL (ref 80.0–100.0)
Monocytes Absolute: 0.4 10*3/uL (ref 0.1–1.0)
Monocytes Relative: 5 %
Neutro Abs: 7.4 10*3/uL (ref 1.7–7.7)
Neutrophils Relative %: 89 %
Platelets: 73 10*3/uL — ABNORMAL LOW (ref 150–400)
RBC: 5.23 MIL/uL — ABNORMAL HIGH (ref 3.87–5.11)
RDW: 14.4 % (ref 11.5–15.5)
WBC: 8.3 10*3/uL (ref 4.0–10.5)
nRBC: 0 % (ref 0.0–0.2)

## 2021-08-16 LAB — TROPONIN I (HIGH SENSITIVITY): Troponin I (High Sensitivity): 23 ng/L — ABNORMAL HIGH (ref <18)

## 2021-08-16 LAB — LACTIC ACID, PLASMA
Lactic Acid, Venous: 1.7 mmol/L (ref 0.5–1.9)
Lactic Acid, Venous: 1.7 mmol/L (ref 0.5–1.9)

## 2021-08-16 LAB — RESP PANEL BY RT-PCR (FLU A&B, COVID) ARPGX2
Influenza A by PCR: NEGATIVE
Influenza B by PCR: NEGATIVE
SARS Coronavirus 2 by RT PCR: NEGATIVE

## 2021-08-16 MED ORDER — SODIUM CHLORIDE 0.9 % IV BOLUS
1000.0000 mL | Freq: Once | INTRAVENOUS | Status: AC
  Administered 2021-08-16: 1000 mL via INTRAVENOUS

## 2021-08-16 NOTE — ED Provider Notes (Signed)
[facility] EMERGENCY DEPARTMENT Provider Note   CSN: [id number] Arrival date & time: 08/16/21  1806     History Chief Complaint  Patient presents with   Chills   Hypotension    Shannon [name] is a 55 y.o. female presented emergency department generalized fatigue and weakness.  The patient was seen in the emergency department 2 days ago November 16 with concern for mid thoracic and back pain that had started the day before.  She describes significant fatigue since then.  She felt a heaviness in her chest as well and was reporting low energy.  While in the emergency department she had blood test including a D-dimer, which is elevated, then a CT pulmonary embolism scan which did not show any sign of acute PE, aortic dissection, or pneumonia.  Her troponin tests are unremarkable.  The rest of her labs at that time were largely unremarkable and she was discharged home.  She comes back today reporting continued generalized fatigue and low energy.  She said her blood pressure is much lower than normal.  Normally her systolic pressure is 035.  She reports a headache and muscle aches.  HPI     Past Medical History:  Diagnosis Date   Anxiety    Arthritis    oa needs hip replacement on right   Asthma    allergies    Cancer ([place])    squamous cell areas removed from vulva and pre caner areas removed from leg    Carotid artery occlusion    Chronic diastolic HF (heart failure) ([place])    pt denies   Complication of anesthesia    major depression after general anesthesia   Coronary artery disease    LHC 11/25/12 90% stenosis mid LCx & otherwise nonobstructive dz w/ EF 60-65% S/p PTCA/DES to LCx   Depression    Dyspnea    with exertion    Elevated cholesterol    Exertional dyspnea    chronic   Fibromyalgia    GERD (gastroesophageal reflux disease)    History of cardiovascular stress test 06/2015   low risk   HPV in female    Hyperlipidemia    Hypertension    MI (myocardial infarction)  ([place]) fe 17, 2014   Obesity    Pneumonia    hx of x 2 years ago    Polycystic ovary disease    Skin tear of upper arm without complication, left, sequela    small skin tear left upper arm no drainage for 1 week   Sleep apnea    mild no cpap    Tobacco abuse     Patient Active Problem List   Diagnosis Date Noted   Primary osteoarthritis of right hip 01/16/2021   Chronic diastolic HF (heart failure) ([place]) 08/10/2018   GERD (gastroesophageal reflux disease) 08/09/2018   Unstable angina ([place]) 08/09/2018   Carotid artery disease ([place]) 07/24/2017   AAA (abdominal aortic aneurysm) without rupture 01/05/2013   Other malaise and fatigue 01/05/2013   Palpitations 01/05/2013   Tobacco use disorder 12/02/2012   Coronary artery disease    Hyperlipidemia    Hypertension    SHORTNESS OF BREATH 09/04/2009   CHEST PAIN 09/04/2009    Past Surgical History:  Procedure Laterality Date   CORONARY ANGIOPLASTY WITH STENT PLACEMENT     LEFT HEART CATH AND CORONARY ANGIOGRAPHY N/A 08/09/2018   Procedure: LEFT HEART CATH AND CORONARY ANGIOGRAPHY;  Surgeon: Nelva Bush, MD;  Location: [facility];  Service:  Cardiovascular;  Laterality: N/A;   LEFT HEART CATHETERIZATION WITH CORONARY ANGIOGRAM N/A 11/15/2012   Procedure: LEFT HEART CATHETERIZATION WITH CORONARY ANGIOGRAM;  Surgeon: Thayer Headings, MD;  Location[facility] C[facility];  Service: Cardiovascular;  Laterality: N/A;   PERCUTANEOUS CORONARY STENT INTERVENTION (PCI-S)  11/15/2012   Procedure: PERCUTANEOUS CORONARY STENT INTERVENTION (PCI-S);  Surgeon: Thayer Headings, MD;  Location: [facility] CATH LAB;  Service: Cardiovascular;;   skin cancer removed right lower leg Right    TONSILLECTOMY     TOTAL HIP ARTHROPLASTY Right 01/16/2021   Procedure: TOTAL HIP ARTHROPLASTY ANTERIOR APPROACH;  Surgeon: Gaynelle Arabian, MD;  Location: [facility];  Service: Orthopedics;  Laterality: Right;  175min   uterus ablation     VULVECTOMY N/A 11/08/2019   Procedure:  excision of VULVA, vulvoscopy;  Surgeon: Dian Queen, MD;  Location: [facility];  Service: Gynecology;  Laterality: N/A;     OB History   No obstetric history on file.     Family History  Problem Relation Age of Onset   Depression Mother    Anxiety disorder Maternal Grandmother    Anxiety disorder Paternal Grandmother    OCD Paternal Grandmother    Depression Paternal Grandmother    Heart attack Father        Deceased, 7   Cerebral aneurysm Father    Heart disease Father     Social History   Tobacco Use   Smoking status: Every Day    Packs/day: 2.00    Years: 40.00    Pack years: 80.00    Types: Cigarettes    Start date: 03/29/1979   Smokeless tobacco: Never   Tobacco comments:    Currently 2ppd  Vaping Use   Vaping Use: Never used  Substance Use Topics   Alcohol use: Not Currently    Comment: rare   Drug use: No    Home Medications Prior to Admission medications   Medication Sig Start Date End Date Taking? Authorizing Provider  albuterol (PROVENTIL HFA;VENTOLIN HFA) 108 (90 Base) MCG/ACT inhaler Inhale 2 puffs into the lungs every 4 (four) hours as needed for wheezing or shortness of breath. 12/21/15  Yes Ward, Delice Bison, DO  ALPRAZolam (XANAX) 1 MG tablet Take 1 mg by mouth 2 (two) times daily as needed for anxiety. 06/12/17  Yes [provider]  aspirin EC 81 MG tablet Take 81 mg by mouth daily. Swallow whole.   Yes [provider]  bisoprolol-hydrochlorothiazide (ZIAC) 5-6.25 MG tablet Take 1 tablet by mouth daily. Pt takes in the pm 12/21/19  Yes [provider]  escitalopram (LEXAPRO) 20 MG tablet Take 20 mg by mouth daily. Takes in the pm   Yes [provider]  fluticasone (FLONASE) 50 MCG/ACT nasal spray Place 2 sprays into both nostrils at bedtime.   Yes [provider]  nitroGLYCERIN (NITROSTAT) 0.4 MG SL tablet Place 1 tablet (0.4 mg total) under the tongue every 5 (five) minutes as needed for  chest pain (up to 3 doses). 07/03/21  Yes Strader, [place] M, PA-C  Oxycodone HCl 10 MG TABS Take 10 mg by mouth 4 (four) times daily as needed (pain). 02/15/20  Yes [provider]  pantoprazole (PROTONIX) 40 MG tablet Take 40 mg by mouth daily.   Yes [provider]  rosuvastatin (CRESTOR) 20 MG tablet Take 1 tablet (20 mg total) by mouth at bedtime. 08/10/18  Yes Isaiah Serge, NP  triamcinolone cream (KENALOG) 0.1 % Apply 1 application topically 2 (  two) times daily. Patient taking differently: Apply 1 application topically 2 (two) times daily as needed (irritation). 01/09/20  Yes Scot Jun, FNP  azithromycin (ZITHROMAX) 250 MG tablet Take 1 tablet (250 mg total) by mouth daily. Take first 2 tablets together, then 1 every day until finished. Patient not taking: Reported on 07/03/2021 04/05/21   Wurst, [place], PA-C  benzonatate (TESSALON) 100 MG capsule Take 1 capsule (100 mg total) by mouth every 8 (eight) hours. Patient not taking: Reported on 07/03/2021 04/05/21   Stacey Drain, [place], PA-C  HYDROmorphone (DILAUDID) 2 MG tablet Take 1-2 tablets (2-4 mg total) by mouth every 6 (six) hours as needed for severe pain. Patient not taking: Reported on 07/03/2021 01/17/21   Fenton Foy D, PA-C  lidocaine (LIDODERM) 5 % Place 1 patch onto the skin daily. Remove & Discard patch within 12 hours or as directed by MD Patient not taking: Reported on 08/16/2021 08/14/21   Evalee Jefferson, PA-C  methocarbamol (ROBAXIN) 500 MG tablet Take 1 tablet (500 mg total) by mouth every 6 (six) hours as needed for muscle spasms. Patient not taking: Reported on 07/03/2021 01/17/21   Fenton Foy D, PA-C    Allergies    Clomiphene citrate, Morphine and related, Sulfa antibiotics, and Clomiphene  Review of Systems   Review of Systems  Constitutional:  Positive for chills and fever.  Eyes:  Negative for photophobia and visual disturbance.  Respiratory:  Negative for cough and shortness of breath.    Cardiovascular:  Negative for chest pain and palpitations.  Gastrointestinal:  Negative for abdominal pain and vomiting.  Musculoskeletal:  Negative for arthralgias and myalgias.  Skin:  Negative for color change and rash.  Neurological:  Positive for light-headedness. Negative for syncope.  All other systems reviewed and are negative.  Physical Exam Updated Vital Signs BP 97/73   Pulse (!) 55   Temp 98 F (36.7 C) (Oral)   Resp 19   Ht 5\' 4"  (1.626 m)   Wt 104.3 kg   SpO2 99%   BMI 39.48 kg/m   Physical Exam Constitutional:      General: She is not in acute distress. HENT:     Head: Normocephalic and atraumatic.  Eyes:     Conjunctiva/sclera: Conjunctivae normal.     Pupils: Pupils are equal, round, and reactive to light.  Cardiovascular:     Rate and Rhythm: Normal rate and regular rhythm.  Pulmonary:     Effort: Pulmonary effort is normal. No respiratory distress.  Abdominal:     General: There is no distension.     Tenderness: There is no abdominal tenderness.  Skin:    General: Skin is warm and dry.  Neurological:     General: No focal deficit present.     Mental Status: She is alert. Mental status is at baseline.  Psychiatric:        Mood and Affect: Mood normal.        Behavior: Behavior normal.    ED Results / Procedures / Treatments   Labs (all labs ordered are listed, but only abnormal results are displayed) Labs Reviewed  CBC WITH DIFFERENTIAL/PLATELET - Abnormal; Notable for the following components:      Result Value   RBC 5.23 (*)    Hemoglobin 15.5 (*)    HCT 46.9 (*)    Platelets 73 (*)    Lymphs Abs 0.4 (*)    All other components within normal limits  COMPREHENSIVE METABOLIC PANEL - Abnormal; Notable for the  following components:   Sodium 133 (*)    Potassium 3.4 (*)    Glucose, Bld 160 (*)    Creatinine, Ser 1.17 (*)    Calcium 8.8 (*)    Total Bilirubin 1.3 (*)    GFR, Estimated 54 (*)    All other components within normal limits   URINALYSIS, ROUTINE W REFLEX MICROSCOPIC - Abnormal; Notable for the following components:   Color, Urine AMBER (*)    APPearance HAZY (*)    Hgb urine dipstick MODERATE (*)    Ketones, ur 5 (*)    Protein, ur 30 (*)    Bacteria, UA RARE (*)    All other components within normal limits  TROPONIN I (HIGH SENSITIVITY) - Abnormal; Notable for the following components:   Troponin I (High Sensitivity) 23 (*)    All other components within normal limits  RESP PANEL BY RT-PCR (FLU A&B, COVID) ARPGX2  CULTURE, BLOOD (ROUTINE X 2)  CULTURE, BLOOD (ROUTINE X 2)  LACTIC ACID, PLASMA  LACTIC ACID, PLASMA  TROPONIN I (HIGH SENSITIVITY)    EKG EKG Interpretation  Date/Time:  Friday August 16 2021 23:05:59 EST Ventricular Rate:  57 PR Interval:  153 QRS Duration: 95 QT Interval:  451 QTC Calculation: 440 R Axis:   55 Text Interpretation: Sinus rhythm Confirmed by Octaviano Glow [phone]) on 08/16/2021 11:26:48 PM  Radiology No results found.  Procedures Procedures   Medications Ordered in ED Medications  sodium chloride 0.9 % bolus 1,000 mL (0 mLs Intravenous Stopped 08/16/21 2323)    ED Course  I have reviewed the triage vital signs and the nursing notes.  Pertinent labs & imaging results that were available during my care of the patient were reviewed by me and considered in my medical decision making (see chart for details).  Differential diagnosis for the patient's symptoms include infection versus atypical ACS versus dehydration versus anemia versus other.  I personally reviewed her prior medical records including her recent ED visit including CT PE scan and blood tests.  I also reviewed her cardiac work-up including her left heart catheterization in 2019, which showed patent stents and no significant coronary disease, and then her stress test in 2022 which showed a region of apical possible ischemia.  I also reviewed the CT PE scan from 2 days ago which did show significant  severe coronary calcifications.  This does raise some concern for potential atypical ACS.  Her troponins were flat 2 days ago, very very mildly elevated today at 23.  Her EKG per my interpretation shows a normal sinus rhythm with no obvious ischemic findings.  I will discuss his case with cardiology regarding need for further evaluation or cardiac testing.  Her blood pressure has been running lower than normal.  We will also send blood cultures for infection evaluation as she does report some rigors at home.  No other localizing symptoms of infection.  No abdominal or GI symptoms.  PE study did not show evidence of pneumonia.  COVID and flu have been negative.  She has no leukocytosis.  And her lactate was normal here.  UA is in process.    Clinical Course as of 08/17/21 0056  Fri Aug 16, 2021  2256 She has a very mildly elevated troponins initially.  In the setting of this and her significant coronary calcifications on CT scan, I think it would be reasonable to bring her into the hospital for further cardiac testing and for cardiology evaluation.  This may be an  atypical presentation of coronary syndrome.  I do think be reasonable for a sepsis rule out with blood cultures, although she does not have a fever or leukocytosis at this time, would not initiate antibiotics.  She may benefit from an echocardiogram on the hospital [MT]  2308 EKG per my interpretation shows a normal sinus rhythm with no obvious ischemic findings. [MT]  Sat Aug 17, 2021  0047 I spoke to Dr Shirlee Latch from [facility] cardiology who agreed with plan for medical admission and transfer to [facility] for further cardiac testing, to be determined by cardiac consult tomorrow.  Pt to be admitted to hospitalist [MT]    Clinical Course User Index [MT] Lawarence Meek, Carola Rhine, MD    Final Clinical Impression(s) / ED Diagnoses Final diagnoses:  Other fatigue    Rx / DC Orders ED Discharge Orders     None        Langston Masker Carola Rhine,  MD 08/17/21 [phone]

## 2021-08-16 NOTE — ED Triage Notes (Signed)
Pt with c/o chills, body aches and "feels out of it".  Sent by Urgent Care.

## 2021-08-16 NOTE — ED Triage Notes (Signed)
Pt presents with fever, disorientation, nausea, poor appetite, and lethargy that began 11/16 after leaving the ED

## 2021-08-16 NOTE — ED Notes (Signed)
Per [place], pt transferred to short term hospital for sx management.

## 2021-08-17 DIAGNOSIS — Z20822 Contact with and (suspected) exposure to covid-19: Secondary | ICD-10-CM | POA: Diagnosis not present

## 2021-08-17 DIAGNOSIS — K219 Gastro-esophageal reflux disease without esophagitis: Secondary | ICD-10-CM

## 2021-08-17 DIAGNOSIS — I10 Essential (primary) hypertension: Secondary | ICD-10-CM

## 2021-08-17 DIAGNOSIS — F419 Anxiety disorder, unspecified: Secondary | ICD-10-CM | POA: Diagnosis not present

## 2021-08-17 DIAGNOSIS — R0789 Other chest pain: Secondary | ICD-10-CM

## 2021-08-17 DIAGNOSIS — E871 Hypo-osmolality and hyponatremia: Secondary | ICD-10-CM | POA: Diagnosis not present

## 2021-08-17 DIAGNOSIS — Z6839 Body mass index (BMI) 39.0-39.9, adult: Secondary | ICD-10-CM | POA: Diagnosis not present

## 2021-08-17 DIAGNOSIS — E669 Obesity, unspecified: Secondary | ICD-10-CM | POA: Diagnosis not present

## 2021-08-17 DIAGNOSIS — Z85828 Personal history of other malignant neoplasm of skin: Secondary | ICD-10-CM | POA: Diagnosis not present

## 2021-08-17 DIAGNOSIS — R778 Other specified abnormalities of plasma proteins: Secondary | ICD-10-CM

## 2021-08-17 DIAGNOSIS — R739 Hyperglycemia, unspecified: Secondary | ICD-10-CM

## 2021-08-17 DIAGNOSIS — J45909 Unspecified asthma, uncomplicated: Secondary | ICD-10-CM

## 2021-08-17 DIAGNOSIS — I252 Old myocardial infarction: Secondary | ICD-10-CM | POA: Diagnosis not present

## 2021-08-17 DIAGNOSIS — D696 Thrombocytopenia, unspecified: Secondary | ICD-10-CM | POA: Diagnosis not present

## 2021-08-17 DIAGNOSIS — E782 Mixed hyperlipidemia: Secondary | ICD-10-CM

## 2021-08-17 DIAGNOSIS — I959 Hypotension, unspecified: Secondary | ICD-10-CM

## 2021-08-17 DIAGNOSIS — E876 Hypokalemia: Secondary | ICD-10-CM

## 2021-08-17 DIAGNOSIS — Z96641 Presence of right artificial hip joint: Secondary | ICD-10-CM | POA: Diagnosis not present

## 2021-08-17 DIAGNOSIS — R079 Chest pain, unspecified: Secondary | ICD-10-CM | POA: Diagnosis present

## 2021-08-17 DIAGNOSIS — J4520 Mild intermittent asthma, uncomplicated: Secondary | ICD-10-CM

## 2021-08-17 DIAGNOSIS — R7881 Bacteremia: Secondary | ICD-10-CM | POA: Diagnosis not present

## 2021-08-17 DIAGNOSIS — K750 Abscess of liver: Secondary | ICD-10-CM | POA: Diagnosis not present

## 2021-08-17 DIAGNOSIS — Z955 Presence of coronary angioplasty implant and graft: Secondary | ICD-10-CM | POA: Diagnosis not present

## 2021-08-17 DIAGNOSIS — I248 Other forms of acute ischemic heart disease: Secondary | ICD-10-CM | POA: Diagnosis not present

## 2021-08-17 DIAGNOSIS — I2510 Atherosclerotic heart disease of native coronary artery without angina pectoris: Secondary | ICD-10-CM | POA: Diagnosis not present

## 2021-08-17 DIAGNOSIS — F17210 Nicotine dependence, cigarettes, uncomplicated: Secondary | ICD-10-CM | POA: Diagnosis not present

## 2021-08-17 DIAGNOSIS — R911 Solitary pulmonary nodule: Secondary | ICD-10-CM | POA: Diagnosis not present

## 2021-08-17 DIAGNOSIS — B961 Klebsiella pneumoniae [K. pneumoniae] as the cause of diseases classified elsewhere: Secondary | ICD-10-CM | POA: Diagnosis not present

## 2021-08-17 LAB — PROTIME-INR
INR: 1.2 (ref 0.8–1.2)
Prothrombin Time: 14.9 seconds (ref 11.4–15.2)

## 2021-08-17 LAB — COMPREHENSIVE METABOLIC PANEL
ALT: 20 U/L (ref 0–44)
AST: 23 U/L (ref 15–41)
Albumin: 3.4 g/dL — ABNORMAL LOW (ref 3.5–5.0)
Alkaline Phosphatase: 58 U/L (ref 38–126)
Anion gap: 8 (ref 5–15)
BUN: 19 mg/dL (ref 6–20)
CO2: 25 mmol/L (ref 22–32)
Calcium: 8.4 mg/dL — ABNORMAL LOW (ref 8.9–10.3)
Chloride: 101 mmol/L (ref 98–111)
Creatinine, Ser: 0.88 mg/dL (ref 0.44–1.00)
GFR, Estimated: >60 mL/min (ref >60)
Glucose, Bld: 118 mg/dL — ABNORMAL HIGH (ref 70–99)
Potassium: 3.4 mmol/L — ABNORMAL LOW (ref 3.5–5.1)
Sodium: 134 mmol/L — ABNORMAL LOW (ref 135–145)
Total Bilirubin: 0.8 mg/dL (ref 0.3–1.2)
Total Protein: 7 g/dL (ref 6.5–8.1)

## 2021-08-17 LAB — CBC
HCT: 42 % (ref 36.0–46.0)
Hemoglobin: 13.7 g/dL (ref 12.0–15.0)
MCH: 29.8 pg (ref 26.0–34.0)
MCHC: 32.6 g/dL (ref 30.0–36.0)
MCV: 91.3 fL (ref 80.0–100.0)
Platelets: 62 10*3/uL — ABNORMAL LOW (ref 150–400)
RBC: 4.6 MIL/uL (ref 3.87–5.11)
RDW: 14.6 % (ref 11.5–15.5)
WBC: 7.9 10*3/uL (ref 4.0–10.5)
nRBC: 0 % (ref 0.0–0.2)

## 2021-08-17 LAB — APTT: aPTT: 27 seconds (ref 24–36)

## 2021-08-17 LAB — TROPONIN I (HIGH SENSITIVITY): Troponin I (High Sensitivity): 14 ng/L

## 2021-08-17 LAB — HIV ANTIBODY (ROUTINE TESTING W REFLEX): HIV Screen 4th Generation wRfx: NONREACTIVE

## 2021-08-17 LAB — MAGNESIUM: Magnesium: 2.1 mg/dL (ref 1.7–2.4)

## 2021-08-17 LAB — PHOSPHORUS: Phosphorus: 2.5 mg/dL (ref 2.5–4.6)

## 2021-08-17 MED ORDER — ALBUTEROL SULFATE (2.5 MG/3ML) 0.083% IN NEBU: 2.5000 mg | INHALATION_SOLUTION | RESPIRATORY_TRACT | Status: DC | PRN

## 2021-08-17 MED ORDER — ESCITALOPRAM OXALATE 10 MG PO TABS
20.0000 mg | ORAL_TABLET | Freq: Every day | ORAL | Status: DC
  Administered 2021-08-17: 20 mg via ORAL
  Administered 2021-08-18: 10 mg via ORAL
  Administered 2021-08-19: 20 mg via ORAL
  Filled 2021-08-17 (×3): qty 2

## 2021-08-17 MED ORDER — ENOXAPARIN SODIUM 40 MG/0.4ML IJ SOSY: 40.0000 mg | PREFILLED_SYRINGE | INTRAMUSCULAR | Status: DC

## 2021-08-17 MED ORDER — TRAMADOL HCL 50 MG PO TABS: 50.0000 mg | ORAL_TABLET | Freq: Once | ORAL | Status: DC

## 2021-08-17 MED ORDER — SODIUM CHLORIDE 0.9 % IV SOLN: Freq: Once | INTRAVENOUS | Status: AC

## 2021-08-17 MED ORDER — ALBUTEROL SULFATE HFA 108 (90 BASE) MCG/ACT IN AERS: 2.0000 | INHALATION_SPRAY | RESPIRATORY_TRACT | Status: DC | PRN

## 2021-08-17 MED ORDER — ASPIRIN EC 81 MG PO TBEC
81.0000 mg | DELAYED_RELEASE_TABLET | Freq: Every day | ORAL | Status: DC
  Administered 2021-08-17 – 2021-08-20 (×4): 81 mg via ORAL
  Filled 2021-08-17 (×4): qty 1

## 2021-08-17 MED ORDER — ROSUVASTATIN CALCIUM 20 MG PO TABS
20.0000 mg | ORAL_TABLET | Freq: Every day | ORAL | Status: DC
  Administered 2021-08-17 – 2021-08-19 (×3): 20 mg via ORAL
  Filled 2021-08-17 (×3): qty 1

## 2021-08-17 MED ORDER — POTASSIUM CHLORIDE CRYS ER 20 MEQ PO TBCR
40.0000 meq | EXTENDED_RELEASE_TABLET | Freq: Once | ORAL | Status: AC
  Administered 2021-08-17: 40 meq via ORAL
  Filled 2021-08-17: qty 2

## 2021-08-17 MED ORDER — PANTOPRAZOLE SODIUM 40 MG PO TBEC
40.0000 mg | DELAYED_RELEASE_TABLET | Freq: Every day | ORAL | Status: DC
  Administered 2021-08-17 – 2021-08-20 (×4): 40 mg via ORAL
  Filled 2021-08-17 (×4): qty 1

## 2021-08-17 MED ORDER — NICOTINE 21 MG/24HR TD PT24
21.0000 mg | MEDICATED_PATCH | Freq: Every day | TRANSDERMAL | Status: DC
  Administered 2021-08-17 – 2021-08-20 (×4): 21 mg via TRANSDERMAL
  Filled 2021-08-17 (×4): qty 1

## 2021-08-17 MED ORDER — ACETAMINOPHEN 325 MG PO TABS
650.0000 mg | ORAL_TABLET | ORAL | Status: DC | PRN
  Administered 2021-08-17 – 2021-08-20 (×2): 650 mg via ORAL
  Filled 2021-08-17 (×3): qty 2

## 2021-08-17 MED ORDER — ALPRAZOLAM 0.5 MG PO TABS
1.0000 mg | ORAL_TABLET | Freq: Two times a day (BID) | ORAL | Status: DC | PRN
  Administered 2021-08-17 – 2021-08-18 (×3): 1 mg via ORAL
  Administered 2021-08-19: 0.5 mg via ORAL
  Filled 2021-08-17 (×4): qty 2

## 2021-08-17 NOTE — Progress Notes (Signed)
Patient admitted to the hospital earlier this morning by Dr. Josephine Cables  Patient seen and examined.  She was previously having chest pain.  She has been feeling increasingly weak and tired.  She has been having intermittent cough.  She did have some vomiting.  Physical exam is otherwise unrevealing  Patient admitted to the hospital with atypical chest pain.  Cardiac enzymes were not significantly elevated (23->14).  EKG did not show any acute changes from prior EKGs.  CT chest was negative for pulmonary embolism.  She does have a history of coronary artery disease and had a stress test done in 11/2020 that showed small, mild intensity mild to apical anteroseptal defect that is partially reversible.  Case was discussed by EDP with cardiology on-call at [facility] due to concerns that her symptoms may be related to atypical cardiac symptoms.  Recommendations were to transfer to [facility] for further cardiac work-up.  She is also noted to have significant thrombocytopenia of unclear etiology.  We will check B12 levels, LDH.  She is also has mild hyponatremia/hypokalemia, likely related to decreased p.o. intake.  It is possible her symptoms may be related to a underlying viral illness.  Continue remainder of medications for underlying coronary disease, hyperlipidemia.  Blood pressure meds currently on hold since her blood pressures were soft on arrival.  [facility]

## 2021-08-17 NOTE — H&P (Signed)
History and Physical  Shannon [name] YQM:[id number] DOB: Aug 05, 1966 DOA: 08/16/2021  Referring physician: Wyvonnia Dusky, MD  PCP: Carolee Rota, NP  Patient coming from: Home  Chief Complaint: Low blood pressure and chills   HPI: Shannon [name] is a 55 y.o. female with medical history significant for CAD, GERD, hyperlipidemia, hypertension, asthma and anxiety who presents to the emergency department due to weakness and fatigue.  Patient presented to the ED on 11/16 due to midthoracic and back pain which was diagnosed to be due to pleuritic pain and was treated with aspirin and Percocet prior to being discharged home.  Patient now complaining of generalized weakness and decreased energy with chills.  She endorsed headache and muscle aches.  Patient denies nausea, vomiting, fever, diarrhea or abdominal pain  ED Course:  In the emergency department, she was bradycardic and BP was soft at 106/68.  Work-up in the ED showed H/H 15.5/46.9, thrombocytopenia, hyponatremia, hypokalemia, hyperglycemia.  Troponin x2 - 23 > 14.  Lactic acid was negative, urinalysis was unimpressive for UTI.  Influenza A, B, SARS coronavirus 2 was negative.  IV hydration was provided.  Cardiology was consulted and recommended admitting patient to [facility].  Hospitalist was asked to admit patient for further evaluation and management.  Review of Systems: Constitutional: Positive for chills and negative for fever.  HENT: Negative for ear pain and sore throat.   Eyes: Negative for pain and visual disturbance.  Respiratory: Negative for cough, chest tightness and shortness of breath.   Cardiovascular: Negative for chest pain and palpitations.  Gastrointestinal: Negative for abdominal pain and vomiting.  Endocrine: Negative for polyphagia and polyuria.  Genitourinary: Negative for decreased urine volume, dysuria, enuresis Musculoskeletal: Negative for arthralgias and back pain.  Skin: Negative for color change and rash.   Allergic/Immunologic: Negative for immunocompromised state.  Neurological: Positive for weakness and lightheadedness.  Negative for tremors, syncope, speech difficulty Hematological: Does not bruise/bleed easily.  All other systems reviewed and are negative    Past Medical History:  Diagnosis Date   Anxiety    Arthritis    oa needs hip replacement on right   Asthma    allergies    Cancer ([place])    squamous cell areas removed from vulva and pre caner areas removed from leg    Carotid artery occlusion    Chronic diastolic HF (heart failure) ([place])    pt denies   Complication of anesthesia    major depression after general anesthesia   Coronary artery disease    LHC 11/25/12 90% stenosis mid LCx & otherwise nonobstructive dz w/ EF 60-65% S/p PTCA/DES to LCx   Depression    Dyspnea    with exertion    Elevated cholesterol    Exertional dyspnea    chronic   Fibromyalgia    GERD (gastroesophageal reflux disease)    History of cardiovascular stress test 06/2015   low risk   HPV in female    Hyperlipidemia    Hypertension    MI (myocardial infarction) ([place]) fe 17, 2014   Obesity    Pneumonia    hx of x 2 years ago    Polycystic ovary disease    Skin tear of upper arm without complication, left, sequela    small skin tear left upper arm no drainage for 1 week   Sleep apnea    mild no cpap    Tobacco abuse    Past Surgical History:  Procedure Laterality Date   CORONARY  ANGIOPLASTY WITH STENT PLACEMENT     LEFT HEART CATH AND CORONARY ANGIOGRAPHY N/A 08/09/2018   Procedure: LEFT HEART CATH AND CORONARY ANGIOGRAPHY;  Surgeon: Nelva Bush, MD;  Location: [facility];  Service: Cardiovascular;  Laterality: N/A;   LEFT HEART CATHETERIZATION WITH CORONARY ANGIOGRAM N/A 11/15/2012   Procedure: LEFT HEART CATHETERIZATION WITH CORONARY ANGIOGRAM;  Surgeon: Thayer Headings, MD;  Location: [facility] CATH LAB;  Service: Cardiovascular;  Laterality: N/A;   PERCUTANEOUS CORONARY  STENT INTERVENTION (PCI-S)  11/15/2012   Procedure: PERCUTANEOUS CORONARY STENT INTERVENTION (PCI-S);  Surgeon: Thayer Headings, MD;  Location: [facility] CATH LAB;  Service: Cardiovascular;;   skin cancer removed right lower leg Right    TONSILLECTOMY     TOTAL HIP ARTHROPLASTY Right 01/16/2021   Procedure: TOTAL HIP ARTHROPLASTY ANTERIOR APPROACH;  Surgeon: Gaynelle Arabian, MD;  Location: [facility];  Service: Orthopedics;  Laterality: Right;  123min   uterus ablation     VULVECTOMY N/A 11/08/2019   Procedure: excision of VULVA, vulvoscopy;  Surgeon: Dian Queen, MD;  Location: [facility];  Service: Gynecology;  Laterality: N/A;    Social History:  reports that she has been smoking cigarettes. She started smoking about 42 years ago. She has a 80.00 pack-year smoking history. She has never used smokeless tobacco. She reports that she does not currently use alcohol. She reports that she does not use drugs.   Allergies  Allergen Reactions   Clomiphene Citrate Nausea And Vomiting   Morphine And Related Other (See Comments)    PATIENT REFUSES THIS MEDICATION: states that she does not tolerate this medication well   Sulfa Antibiotics Other (See Comments)    Not sure ? rash   Clomiphene Rash    Family History  Problem Relation Age of Onset   Depression Mother    Anxiety disorder Maternal Grandmother    Anxiety disorder Paternal Grandmother    OCD Paternal Grandmother    Depression Paternal Grandmother    Heart attack Father        Deceased, 32   Cerebral aneurysm Father    Heart disease Father     Prior to Admission medications   Medication Sig Start Date End Date Taking? Authorizing Provider  albuterol (PROVENTIL HFA;VENTOLIN HFA) 108 (90 Base) MCG/ACT inhaler Inhale 2 puffs into the lungs every 4 (four) hours as needed for wheezing or shortness of breath. 12/21/15  Yes Ward, Delice Bison, DO  ALPRAZolam (XANAX) 1 MG tablet Take 1 mg by mouth 2 (two) times daily as needed for  anxiety. 06/12/17  Yes [provider]  aspirin EC 81 MG tablet Take 81 mg by mouth daily. Swallow whole.   Yes [provider]  bisoprolol-hydrochlorothiazide (ZIAC) 5-6.25 MG tablet Take 1 tablet by mouth daily. Pt takes in the pm 12/21/19  Yes [provider]  escitalopram (LEXAPRO) 20 MG tablet Take 20 mg by mouth daily. Takes in the pm   Yes [provider]  fluticasone (FLONASE) 50 MCG/ACT nasal spray Place 2 sprays into both nostrils at bedtime.   Yes [provider]  nitroGLYCERIN (NITROSTAT) 0.4 MG SL tablet Place 1 tablet (0.4 mg total) under the tongue every 5 (five) minutes as needed for chest pain (up to 3 doses). 07/03/21  Yes Strader, [place] M, PA-C  Oxycodone HCl 10 MG TABS Take 10 mg by mouth 4 (four) times daily as needed (pain). 02/15/20  Yes [provider]  pantoprazole (PROTONIX) 40 MG tablet Take 40 mg by  mouth daily.   Yes [provider]  rosuvastatin (CRESTOR) 20 MG tablet Take 1 tablet (20 mg total) by mouth at bedtime. 08/10/18  Yes Isaiah Serge, NP  triamcinolone cream (KENALOG) 0.1 % Apply 1 application topically 2 (two) times daily. Patient taking differently: Apply 1 application topically 2 (two) times daily as needed (irritation). 01/09/20  Yes Scot Jun, FNP  azithromycin (ZITHROMAX) 250 MG tablet Take 1 tablet (250 mg total) by mouth daily. Take first 2 tablets together, then 1 every day until finished. Patient not taking: Reported on 07/03/2021 04/05/21   Wurst, [place], PA-C  benzonatate (TESSALON) 100 MG capsule Take 1 capsule (100 mg total) by mouth every 8 (eight) hours. Patient not taking: Reported on 07/03/2021 04/05/21   Stacey Drain, [place], PA-C  HYDROmorphone (DILAUDID) 2 MG tablet Take 1-2 tablets (2-4 mg total) by mouth every 6 (six) hours as needed for severe pain. Patient not taking: Reported on 07/03/2021 01/17/21   Fenton Foy D, PA-C  lidocaine (LIDODERM) 5 % Place 1 patch onto the  skin daily. Remove & Discard patch within 12 hours or as directed by MD Patient not taking: Reported on 08/16/2021 08/14/21   Evalee Jefferson, PA-C  methocarbamol (ROBAXIN) 500 MG tablet Take 1 tablet (500 mg total) by mouth every 6 (six) hours as needed for muscle spasms. Patient not taking: Reported on 07/03/2021 01/17/21   Jonnie Kind, PA-C    Physical Exam: BP (!) 93/56   Pulse (!) 57   Temp 98 F (36.7 C) (Oral)   Resp 17   Ht 5\' 4"  (1.626 m)   Wt 104.3 kg   SpO2 98%   BMI 39.48 kg/m   General: 55 y.o. year-old female well developed well nourished in no acute distress.  Alert and oriented x3. HEENT: Dry mucous membrane.  NCAT, EOMI Neck: Supple, trachea medial Cardiovascular: Regular rate and rhythm with no rubs or gallops.  No thyromegaly or JVD noted.  No lower extremity edema. 2/4 pulses in all 4 extremities. Respiratory: Clear to auscultation with no wheezes or rales. Good inspiratory effort. Abdomen: Soft, nontender nondistended with normal bowel sounds x4 quadrants. Muskuloskeletal: No cyanosis, clubbing or edema noted bilaterally Neuro: CN II-XII intact, strength 5/5 x 4, sensation, reflexes intact Skin: No ulcerative lesions noted or rashes Psychiatry: Judgement and insight appear normal. Mood is appropriate for condition and setting          Labs on Admission:  Basic Metabolic Panel: Recent Labs  Lab 08/14/21 1311 08/16/21 2000  NA 137 133*  K 4.9 3.4*  CL 103 99  CO2 23 22  GLUCOSE 86 160*  BUN 11 16  CREATININE 1.02* 1.17*  CALCIUM 9.3 8.8*   Liver Function Tests: Recent Labs  Lab 08/14/21 1455 08/16/21 2000  AST 20 26  ALT 22 21  ALKPHOS 80 68  BILITOT 0.4 1.3*  PROT 7.5 7.7  ALBUMIN 4.1 3.8   No results for input(s): LIPASE, AMYLASE in the last 168 hours. No results for input(s): AMMONIA in the last 168 hours. CBC: Recent Labs  Lab 08/14/21 1311 08/16/21 2000  WBC 7.9 8.3  NEUTROABS  --  7.4  HGB 16.2* 15.5*  HCT 48.6* 46.9*  MCV  90.2 89.7  PLT 132* 73*   Cardiac Enzymes: No results for input(s): CKTOTAL, CKMB, CKMBINDEX, TROPONINI in the last 168 hours.  BNP (last 3 results) No results for input(s): BNP in the last 8760 hours.  ProBNP (last 3 results) No results for  input(s): PROBNP in the last 8760 hours.  CBG: No results for input(s): GLUCAP in the last 168 hours.  Radiological Exams on Admission: No results found.  EKG: I independently viewed the EKG done and my findings are as followed: Sinus bradycardia at a rate of 57 bpm  Assessment/Plan Present on Admission:  Chest pain  Coronary artery disease  GERD (gastroesophageal reflux disease)  Principal Problem:   Chest pain Active Problems:   Coronary artery disease   Mixed hyperlipidemia   Essential hypertension   GERD (gastroesophageal reflux disease)   Hypotension   Thrombocytopenia ([facility])   Hyperglycemia   Hyponatremia   Hypokalemia   Elevated troponin   Obesity (BMI 30-39.9)   Asthma   Anxiety  Atypical chest pain Patient complaining of chest pain and work-up done when she presented to the ED on 11/16 was negative Troponin checked in the ED today flattened out- 23 > 14 She complained of generalized weakness and decreased energy which was thought to possibly be related to history of CAD Stress test done in 2022 showed a region of apical possible ischemia and CT PE scan done patient presented to the ED 3 days ago showed a region of possible ischemia per ED physician's medical record, cardiology consulted recommended admitting patient to [facility] for further evaluation. Continue telemetry  Elevated troponin possibly secondary to type II demand ischemia Troponin flattened out as described above  Hypotension IV hydration was provided, continue IV hydration.  Hypokalemia K+ 3.4, this will be replenished  Hyponatremia Na 133, continue IV Hydration  Hyperglycemia possibly a reactive process CBG 160, continue to monitor blood  glucose levels.  Thrombocytopenia Platelets 73, stable without any bleeding episode Continue to monitor platelet levels.  History of CAD Continue aspirin and statin Hold nitroglycerin due to hypotension  Mixed hyperlipidemia  Continue Crestor  Essential hypertension BP meds will be held at this time due to soft BP  GERD Continue Protonix  Anxiety Continue Xanax per home regimen  Asthma Continue albuterol  Obesity (BMI 39.48 kg/m) Patient counseled on diet and lifestyle modification   DVT prophylaxis: SCDs  Code Status: Full code  Family Communication: None at bedside  Disposition Plan:  Patient is from:                        home Anticipated DC to:                   SNF or family members home Anticipated DC date:               2-3 days Anticipated DC barriers:          Patient requires inpatient management due to atypical chest pain with concern for ACS    Consults called: Cardiology  Admission status: Observation    Bernadette Hoit MD [facility] Pager [phone]  If 7PM-7AM, please contact night-coverage [email or web address] Password [email or web address]  08/17/2021, 4:58 AM   > >

## 2021-08-17 NOTE — ED Notes (Signed)
Report called to 6E , [facility] called for transport

## 2021-08-17 NOTE — ED Notes (Signed)
Pt up OOB walking to bathroom , tolerated well , still waiting on bed placement at [facility]

## 2021-08-17 NOTE — ED Notes (Signed)
Pt given cheese and fruit and pack of nabs and ice water per request.

## 2021-08-18 ENCOUNTER — Observation Stay (HOSPITAL_COMMUNITY): Payer: Medicare HMO

## 2021-08-18 ENCOUNTER — Inpatient Hospital Stay (HOSPITAL_COMMUNITY): Payer: Medicare HMO

## 2021-08-18 ENCOUNTER — Encounter (HOSPITAL_COMMUNITY): Payer: Self-pay | Admitting: Internal Medicine

## 2021-08-18 DIAGNOSIS — K750 Abscess of liver: Secondary | ICD-10-CM | POA: Diagnosis present

## 2021-08-18 DIAGNOSIS — R0789 Other chest pain: Secondary | ICD-10-CM | POA: Diagnosis not present

## 2021-08-18 DIAGNOSIS — I252 Old myocardial infarction: Secondary | ICD-10-CM | POA: Diagnosis not present

## 2021-08-18 DIAGNOSIS — R079 Chest pain, unspecified: Secondary | ICD-10-CM | POA: Diagnosis present

## 2021-08-18 DIAGNOSIS — Z20822 Contact with and (suspected) exposure to covid-19: Secondary | ICD-10-CM | POA: Diagnosis present

## 2021-08-18 DIAGNOSIS — K219 Gastro-esophageal reflux disease without esophagitis: Secondary | ICD-10-CM | POA: Diagnosis present

## 2021-08-18 DIAGNOSIS — E782 Mixed hyperlipidemia: Secondary | ICD-10-CM | POA: Diagnosis present

## 2021-08-18 DIAGNOSIS — R911 Solitary pulmonary nodule: Secondary | ICD-10-CM | POA: Diagnosis present

## 2021-08-18 DIAGNOSIS — J4520 Mild intermittent asthma, uncomplicated: Secondary | ICD-10-CM | POA: Diagnosis not present

## 2021-08-18 DIAGNOSIS — I25118 Atherosclerotic heart disease of native coronary artery with other forms of angina pectoris: Secondary | ICD-10-CM

## 2021-08-18 DIAGNOSIS — I248 Other forms of acute ischemic heart disease: Secondary | ICD-10-CM | POA: Diagnosis present

## 2021-08-18 DIAGNOSIS — I10 Essential (primary) hypertension: Secondary | ICD-10-CM | POA: Diagnosis present

## 2021-08-18 DIAGNOSIS — F17210 Nicotine dependence, cigarettes, uncomplicated: Secondary | ICD-10-CM | POA: Diagnosis present

## 2021-08-18 DIAGNOSIS — R7881 Bacteremia: Secondary | ICD-10-CM

## 2021-08-18 DIAGNOSIS — F419 Anxiety disorder, unspecified: Secondary | ICD-10-CM | POA: Diagnosis present

## 2021-08-18 DIAGNOSIS — D696 Thrombocytopenia, unspecified: Secondary | ICD-10-CM | POA: Diagnosis present

## 2021-08-18 DIAGNOSIS — Z955 Presence of coronary angioplasty implant and graft: Secondary | ICD-10-CM | POA: Diagnosis not present

## 2021-08-18 DIAGNOSIS — Z85828 Personal history of other malignant neoplasm of skin: Secondary | ICD-10-CM | POA: Diagnosis not present

## 2021-08-18 DIAGNOSIS — B961 Klebsiella pneumoniae [K. pneumoniae] as the cause of diseases classified elsewhere: Secondary | ICD-10-CM | POA: Diagnosis present

## 2021-08-18 DIAGNOSIS — Z6839 Body mass index (BMI) 39.0-39.9, adult: Secondary | ICD-10-CM | POA: Diagnosis not present

## 2021-08-18 DIAGNOSIS — Z96641 Presence of right artificial hip joint: Secondary | ICD-10-CM | POA: Diagnosis present

## 2021-08-18 DIAGNOSIS — E871 Hypo-osmolality and hyponatremia: Secondary | ICD-10-CM | POA: Diagnosis present

## 2021-08-18 DIAGNOSIS — E876 Hypokalemia: Secondary | ICD-10-CM | POA: Diagnosis present

## 2021-08-18 DIAGNOSIS — I959 Hypotension, unspecified: Secondary | ICD-10-CM | POA: Diagnosis not present

## 2021-08-18 DIAGNOSIS — J45909 Unspecified asthma, uncomplicated: Secondary | ICD-10-CM | POA: Diagnosis present

## 2021-08-18 DIAGNOSIS — I2510 Atherosclerotic heart disease of native coronary artery without angina pectoris: Secondary | ICD-10-CM | POA: Diagnosis present

## 2021-08-18 DIAGNOSIS — E669 Obesity, unspecified: Secondary | ICD-10-CM | POA: Diagnosis present

## 2021-08-18 DIAGNOSIS — R739 Hyperglycemia, unspecified: Secondary | ICD-10-CM | POA: Diagnosis present

## 2021-08-18 LAB — BLOOD CULTURE ID PANEL (REFLEXED) - BCID2

## 2021-08-18 LAB — VITAMIN B12: Vitamin B-12: 204 pg/mL (ref 180–914)

## 2021-08-18 LAB — COMPREHENSIVE METABOLIC PANEL
ALT: 18 U/L (ref 0–44)
AST: 21 U/L (ref 15–41)
Albumin: 2.9 g/dL — ABNORMAL LOW (ref 3.5–5.0)
Alkaline Phosphatase: 51 U/L (ref 38–126)
Anion gap: 5 (ref 5–15)
BUN: 16 mg/dL (ref 6–20)
CO2: 24 mmol/L (ref 22–32)
Calcium: 8.2 mg/dL — ABNORMAL LOW (ref 8.9–10.3)
Chloride: 106 mmol/L (ref 98–111)
Creatinine, Ser: 1.02 mg/dL — ABNORMAL HIGH (ref 0.44–1.00)
GFR, Estimated: >60 mL/min (ref >60)
Glucose, Bld: 112 mg/dL — ABNORMAL HIGH (ref 70–99)
Potassium: 3.6 mmol/L (ref 3.5–5.1)
Sodium: 135 mmol/L (ref 135–145)
Total Bilirubin: 0.4 mg/dL (ref 0.3–1.2)
Total Protein: 5.9 g/dL — ABNORMAL LOW (ref 6.5–8.1)

## 2021-08-18 LAB — CBC
HCT: 36.8 % (ref 36.0–46.0)
Hemoglobin: 11.9 g/dL — ABNORMAL LOW (ref 12.0–15.0)
MCH: 28.6 pg (ref 26.0–34.0)
MCHC: 32.3 g/dL (ref 30.0–36.0)
MCV: 88.5 fL (ref 80.0–100.0)
Platelets: 60 10*3/uL — ABNORMAL LOW (ref 150–400)
RBC: 4.16 MIL/uL (ref 3.87–5.11)
RDW: 14.4 % (ref 11.5–15.5)
WBC: 6.1 10*3/uL (ref 4.0–10.5)
nRBC: 0 % (ref 0.0–0.2)

## 2021-08-18 LAB — ECHOCARDIOGRAM COMPLETE
Area-P 1/2: 2.7 cm2
Height: 64 in
S' Lateral: 2.6 cm
Weight: 3680 oz

## 2021-08-18 LAB — TROPONIN I (HIGH SENSITIVITY): Troponin I (High Sensitivity): 6 ng/L (ref <18)

## 2021-08-18 LAB — LACTATE DEHYDROGENASE: LDH: 146 U/L (ref 98–192)

## 2021-08-18 IMAGING — CT CT ABD-PELV W/ CM
2 of 5 series · 16 of 46 positions shown, 18 images · IV contrast (APPLIED)
Comparison: CT of the chest from [DATE]

CLINICAL DATA: Fever and chills with Klebsiella positive blood
cultures.

EXAM:
CT ABDOMEN AND PELVIS WITH CONTRAST
TECHNIQUE: Multidetector CT imaging of the abdomen and pelvis was performed
using the standard protocol following bolus administration of
intravenous contrast.
CONTRAST:  100mL OMNIPAQUE 300

[Series 3: abd/ pelvis 5.0 i30f 2 · axial · 0.88mm/px · z∈[+819,+1214]mm · 13 of 91 slices shown, 15 images]
[im 6/91  soft-tissue]
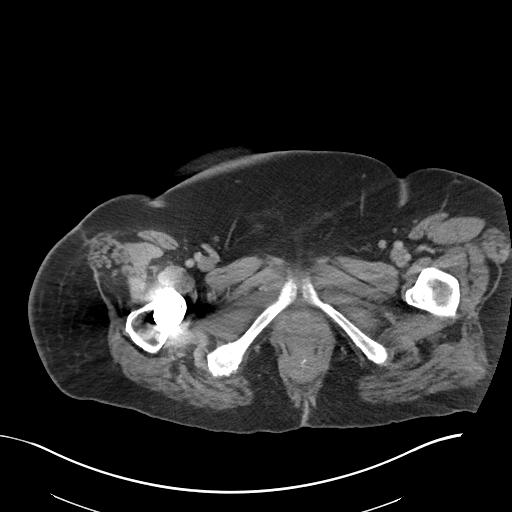
[im 6/91  bone]
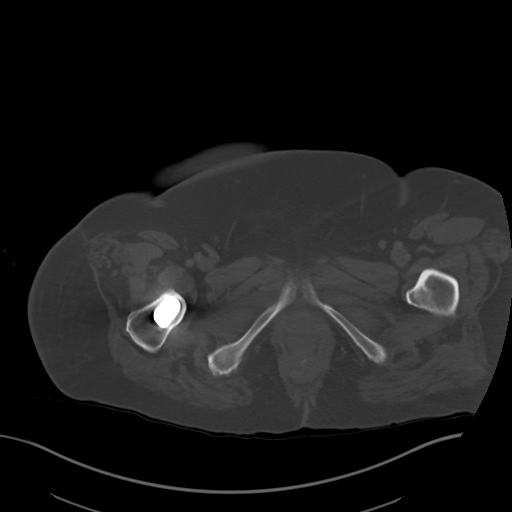
[im 11/91  soft-tissue]
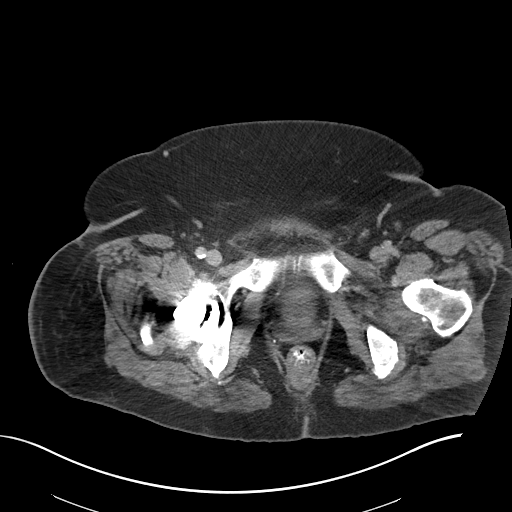
[im 22/91  soft-tissue]
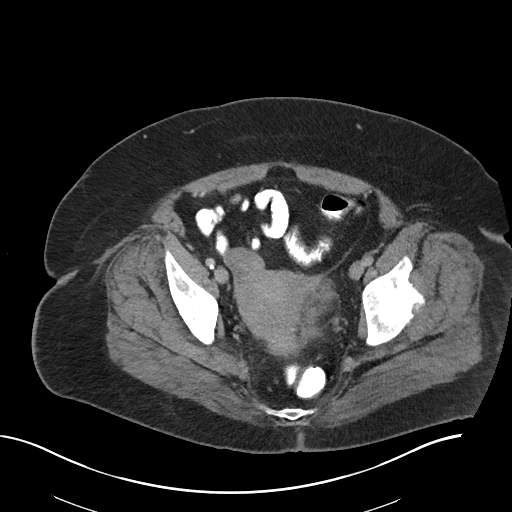
[im 27/91  soft-tissue]
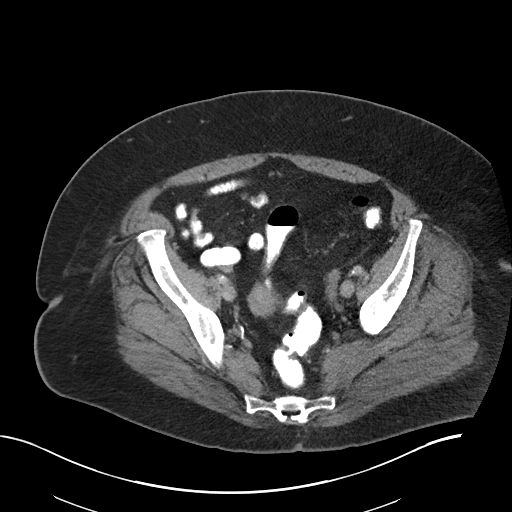
[im 32/91  soft-tissue]
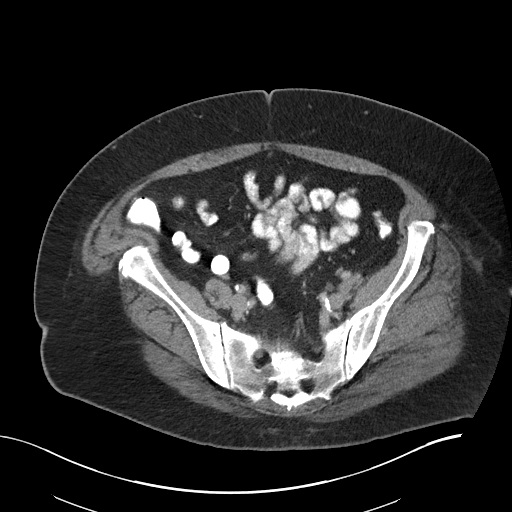
[im 38/91  soft-tissue]
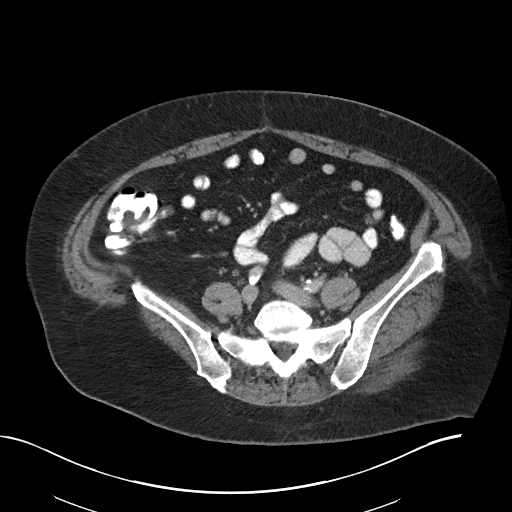
[im 48/91  soft-tissue]
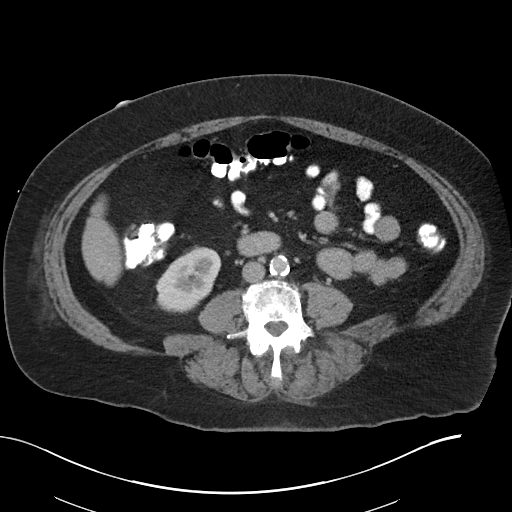
[im 53/91  soft-tissue]
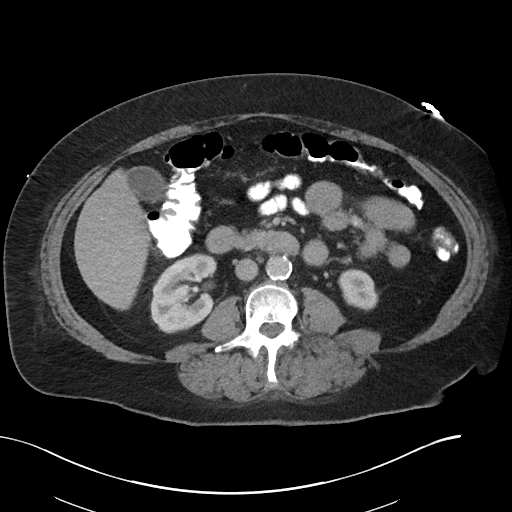
[im 59/91  soft-tissue]
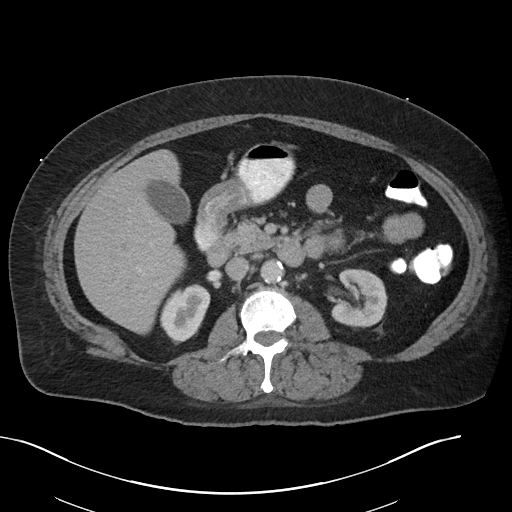
[im 59/91  bone]
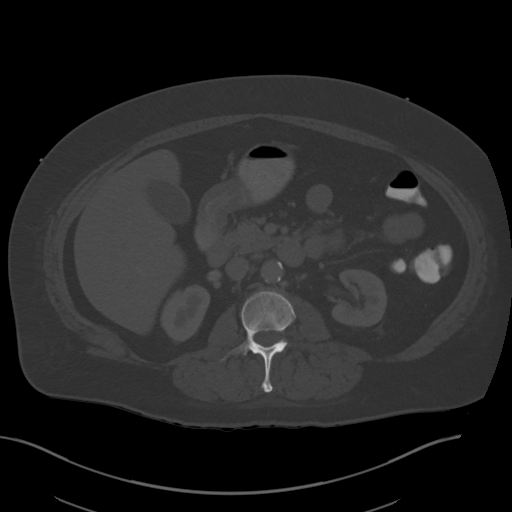
[im 64/91  soft-tissue]
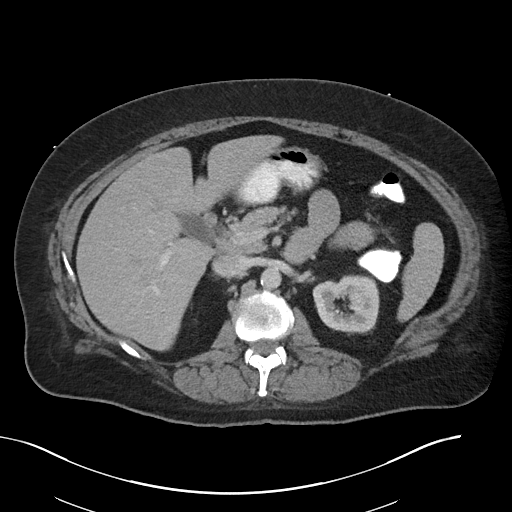
[im 69/91  soft-tissue]
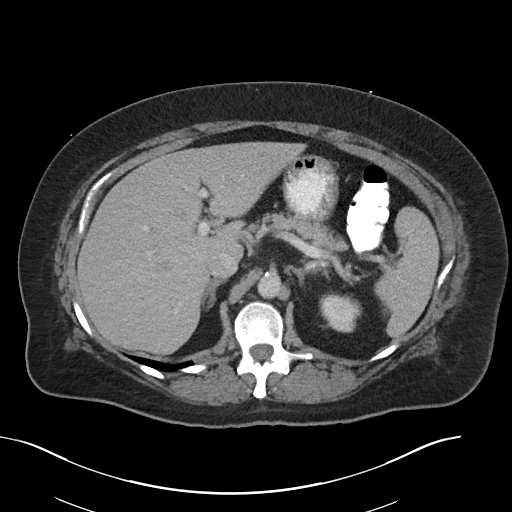
[im 80/91  soft-tissue]
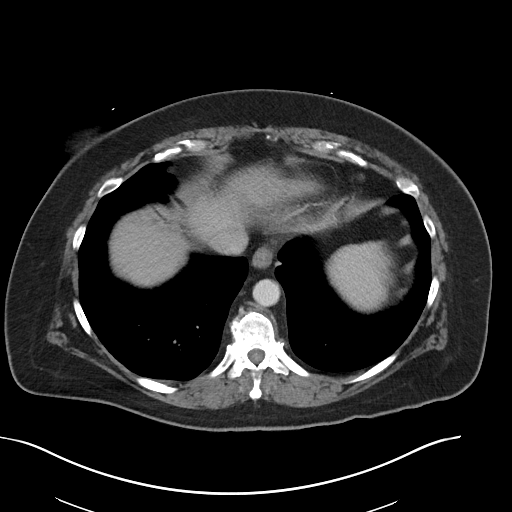
[im 85/91  soft-tissue]
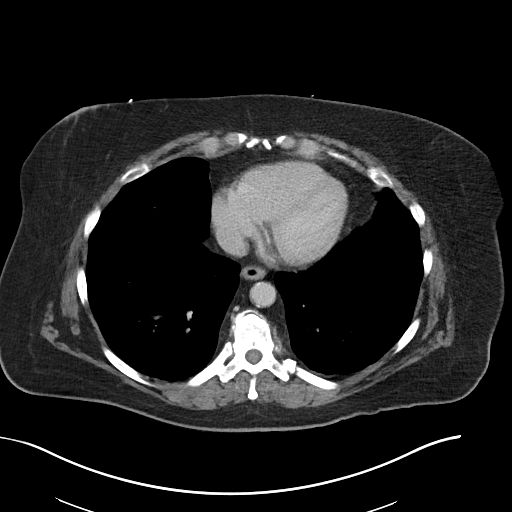

[Series 6: coronal soft tissue · coronal · 0.89mm/px · 3 of 114 slices shown]
[im 38/114  soft-tissue]
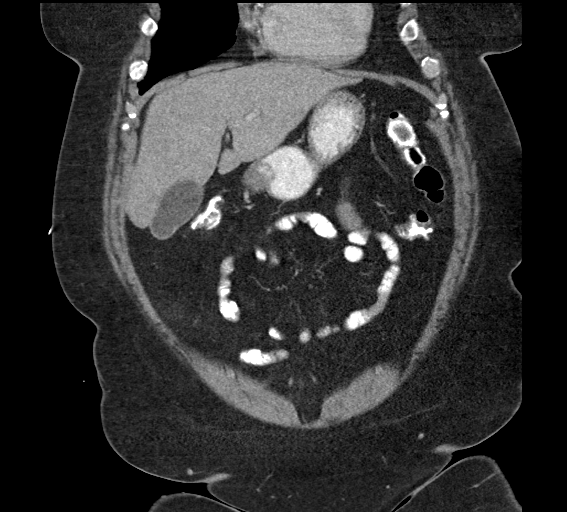
[im 51/114  soft-tissue]
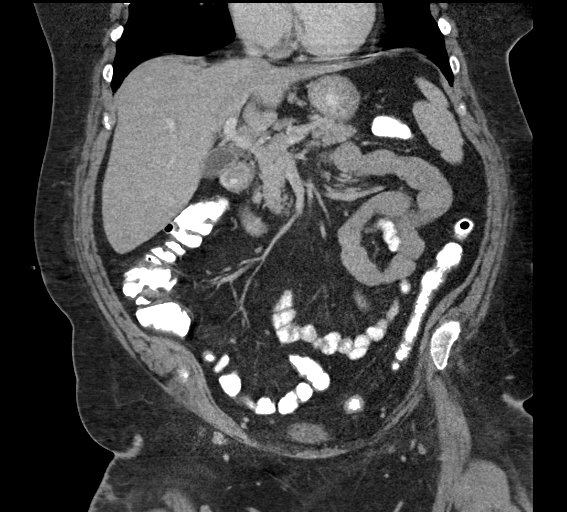
[im 63/114  soft-tissue]
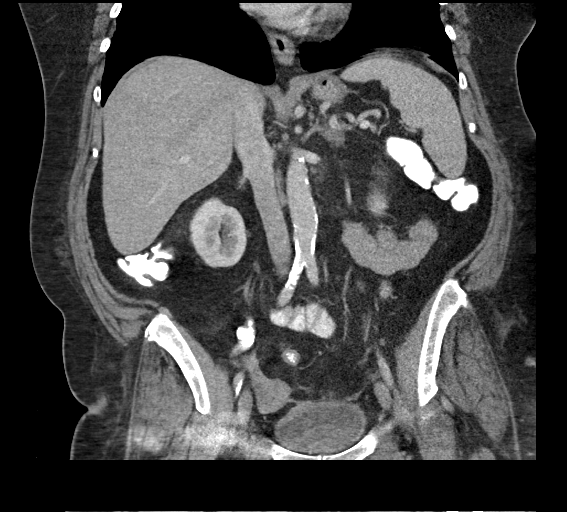

[16 of 46 positions shown; findings below may reference images not displayed]

FINDINGS: Lower chest: Lung bases are free of acute infiltrate or sizable
effusion.

Hepatobiliary: A previously seen hypodensity within the right lobe
of the liver has increased in size now measuring almost 2 cm with
some peripheral enhancement identified suspicious for small hepatic
abscess. Second hypodensity is noted along the medial aspect of the
right lobe of the liver best seen on image number 27 of series 3.
The hypodense region measures approximately 10 mm in dimension.
Gallbladder is within normal limits.

Pancreas: Unremarkable. No pancreatic ductal dilatation or
surrounding inflammatory changes.

Spleen: Normal in size without focal abnormality.

Adrenals/Urinary Tract: Adrenal glands are within normal limits.
Kidneys demonstrate a normal enhancement pattern bilaterally. No
renal calculi or obstructive changes are noted. The bladder is
partially distended.

Stomach/Bowel: Colon shows scattered contrast material throughout
colon. The appendix is within normal limits. Small bowel and stomach
appear within normal limits.

Vascular/Lymphatic: Aortic atherosclerosis. No enlarged abdominal or
pelvic lymph nodes.

Reproductive: Uterus and bilateral adnexa are unremarkable.

Other: No abdominal wall hernia or abnormality. No abdominopelvic
ascites.

Musculoskeletal: Right hip replacement is noted. Degenerative
changes of left hip are noted. Degenerative changes of lumbar spine
are seen.
IMPRESSION: Previously seen hypodensity within the right lobe of the liver which
has been stable for over 1 year now demonstrates some slight
increase in size with peripheral enhancement suspicious for hepatic
abscess. This would correspond with the given clinical history. MRI
with contrast is recommended for further evaluation.

No other focal abnormality is noted.

## 2021-08-18 MED ORDER — SODIUM CHLORIDE 0.9 % IV SOLN
2.0000 g | INTRAVENOUS | Status: DC
  Administered 2021-08-18 – 2021-08-20 (×3): 2 g via INTRAVENOUS
  Filled 2021-08-18 (×4): qty 20

## 2021-08-18 MED ORDER — OXYCODONE HCL 10 MG PO TABS: 10.0000 mg | ORAL_TABLET | Freq: Four times a day (QID) | ORAL | Status: DC | PRN

## 2021-08-18 MED ORDER — BISOPROLOL FUMARATE 5 MG PO TABS: 5.0000 mg | ORAL_TABLET | Freq: Every day | ORAL | Status: DC

## 2021-08-18 MED ORDER — BISOPROLOL FUMARATE 5 MG PO TABS: 5.0000 mg | ORAL_TABLET | Freq: Every evening | ORAL | Status: DC

## 2021-08-18 MED ORDER — FLUTICASONE PROPIONATE 50 MCG/ACT NA SUSP
2.0000 | Freq: Every day | NASAL | Status: DC
  Administered 2021-08-18 – 2021-08-19 (×2): 2 via NASAL
  Filled 2021-08-18 (×2): qty 16

## 2021-08-18 MED ORDER — PERFLUTREN LIPID MICROSPHERE
1.0000 mL | INTRAVENOUS | Status: AC | PRN
Start: 2021-08-18 — End: 2021-08-18
  Administered 2021-08-18: 3 mL via INTRAVENOUS
  Filled 2021-08-18: qty 10

## 2021-08-18 MED ORDER — TRIAMCINOLONE ACETONIDE 0.1 % EX CREA
1.0000 "application " | TOPICAL_CREAM | Freq: Two times a day (BID) | CUTANEOUS | Status: DC | PRN
  Filled 2021-08-18: qty 15

## 2021-08-18 MED ORDER — IOHEXOL 300 MG/ML  SOLN
50.0000 mL | Freq: Once | INTRAMUSCULAR | Status: AC | PRN
  Administered 2021-08-18: 50 mL via ORAL

## 2021-08-18 MED ORDER — OXYCODONE HCL 5 MG PO TABS
5.0000 mg | ORAL_TABLET | ORAL | Status: DC | PRN
  Administered 2021-08-18 – 2021-08-20 (×4): 5 mg via ORAL
  Filled 2021-08-18 (×5): qty 1

## 2021-08-18 MED ORDER — IOHEXOL 300 MG/ML  SOLN
100.0000 mL | Freq: Once | INTRAMUSCULAR | Status: AC | PRN
  Administered 2021-08-18: 100 mL via INTRAVENOUS

## 2021-08-18 MED ORDER — SODIUM CHLORIDE 0.9 % IV SOLN: INTRAVENOUS | Status: AC

## 2021-08-18 MED ORDER — SODIUM CHLORIDE 0.9 % IV BOLUS
500.0000 mL | Freq: Once | INTRAVENOUS | Status: AC
  Administered 2021-08-18: 500 mL via INTRAVENOUS

## 2021-08-18 MED ORDER — BISOPROLOL FUMARATE 5 MG PO TABS
2.5000 mg | ORAL_TABLET | Freq: Every day | ORAL | Status: DC
  Administered 2021-08-18 – 2021-08-20 (×3): 2.5 mg via ORAL
  Filled 2021-08-18 (×3): qty 1

## 2021-08-18 MED ORDER — METHOCARBAMOL 500 MG PO TABS: 500.0000 mg | ORAL_TABLET | Freq: Four times a day (QID) | ORAL | Status: DC | PRN

## 2021-08-18 NOTE — Progress Notes (Signed)
PROGRESS NOTE    Shannon [name]  TML:[id number] DOB: May 09, 1966 DOA: 08/16/2021 PCP: Carolee Rota, NP    Chief Complaint  Patient presents with   Chills   Hypotension    Brief Narrative: Patient admitted to the hospital with atypical chest pain.  Cardiac enzymes were not significantly elevated (23->14).  EKG did not show any acute changes from prior EKGs.  CT chest was negative for pulmonary embolism.  She does have a history of coronary artery disease and had a stress test done in 11/2020 that showed small, mild intensity mild to apical anteroseptal defect that is partially reversible.  Case was discussed by EDP with cardiology on-call at [facility] due to concerns that her symptoms may be related to atypical cardiac symptoms.  Recommendations were to transfer to [facility] for further cardiac work-up.  Patient complaints of myalgias, fatigue, generalized weakness, fevers and chills blood cultures were obtained which were positive for Klebsiella pneumonia and patient started on IV Rocephin.   Assessment & Plan:   Principal Problem:   Bacteremia due to Klebsiella pneumoniae Active Problems:   Coronary artery disease   Mixed hyperlipidemia   Essential hypertension   GERD (gastroesophageal reflux disease)   Atypical chest pain   Hypotension   Thrombocytopenia (HCC)   Hyperglycemia   Hyponatremia   Hypokalemia   Elevated troponin   Obesity (BMI 30-39.9)   Asthma   Anxiety  #1 flulike symptoms, myalgias, fatigue, generalized weakness, fevers, chills likely secondary to Klebsiella pneumonia bacteremia -Patient had presented back to the ED with myalgias, fatigue, generalized weakness, fevers and chills. -Blood cultures obtained concerning for Klebsiella pneumonia preliminary results. -Urinalysis was negative for UTI. -Clinical improvement with initiation of empiric IV antibiotics, IV fluids. -CT abdomen and pelvis to rule out liver abscess. -Continue IV Rocephin,  supportive care.  2.  Atypical chest pain -Patient had presented to the ED 08/14/2021 with chest pain work-up then was negative. -Repeat troponin during this hospitalization noted a flattened troponin levels of 23>>> 14. -Patient had presented with generalized weakness, fatigue and due to history of coronary artery disease there was some concern for a possible cardiac etiology. -Patient noted to have a stress test done 2022 that showed a region of possible apical ischemia. -CT angiogram chest done when patient presented to the ED 3 days prior to admission was negative for PE however showed a region of possible ischemia, coronary calcifications.  EDP discussed with cardiology who recommended transfer to [facility] for further evaluation. -Patient seen by cardiology in consultation and is noted that troponins are reassuring, patient denied any residual chest pain, 2D echo pending. -Cardiology recommending continuation of aspirin, statin, resumption of half home dose bisoprolol to avoid beta-blocker rebound with outpatient follow-up with cardiology.  3.  Hypertension/hypotension -Patient's blood pressure noted to be soft/borderline on presentation likely secondary to bacteremia. -Blood pressure improved with hydration. -Cardiology recommending resumption of half home dose beta-blocker and continue to hold HCTZ. -IV fluids.  4.  Hyperlipidemia -Statin.  5.  Tobacco abuse -Tobacco cessation. -Continue nicotine patch.  6.  Hypokalemia -Repleted.  7.  Hyponatremia -Improved with hydration.  8.  GERD -PPI.  9.  Thrombocytopenia -Patient with no overt bleeding. -Likely secondary to acute infection. -LDH at 146.  Vitamin B12 at 204. -Follow.  10.  Asthma -Stable. -Albuterol.  11.  Anxiety -Continue home regimen Xanax.   DVT prophylaxis: SCDs Code Status: Full Family Communication: Updated patient.  No family at bedside Disposition:   Status  is: Observation  The patient  remains OBS appropriate and will d/c before 2 midnights.      Consultants:  Cardiology: Dr. Curt Bears 08/18/2021  Procedures: -2D echo pending CT abdomen and pelvis pending    Antimicrobials:  IV Rocephin 08/18/2021   Subjective: Patient sitting up on the side of the bed.  Overall feeling better.  Stated had some right hip pain on the upper thigh early this morning that was tingling and burning which is since resolved.  Denies any further chest pain.  No shortness of breath.  Overall feeling better.  Feels blood pressure has improved to her usual baseline.  Asking when she can go home.  Objective: Vitals:   08/18/21 0500 08/18/21 0849 08/18/21 1003 08/18/21 1400  BP: 114/69 (!) 142/82 136/76 108/72  Pulse: (!) 58 (!) 59 66   Resp: 18 20 20    Temp: 98 F (36.7 C) 98 F (36.7 C) 97.7 F (36.5 C)   TempSrc: Oral Oral Oral   SpO2: 98% 99% 100%   Weight:      Height:        Intake/Output Summary (Last 24 hours) at 08/18/2021 1615 Last data filed at 08/18/2021 1500 Gross per 24 hour  Intake 700 ml  Output 1 ml  Net 699 ml   Filed Weights   08/16/21 1845  Weight: 104.3 kg    Examination:  General exam: Appears calm and comfortable  Respiratory system: Clear to auscultation. Respiratory effort normal. Cardiovascular system: S1 & S2 heard, RRR. No JVD, murmurs, rubs, gallops or clicks. No pedal edema. Gastrointestinal system: Abdomen is nondistended, soft and nontender. No organomegaly or masses felt. Normal bowel sounds heard. Central nervous system: Alert and oriented. No focal neurological deficits. Extremities: Symmetric 5 x 5 power. Skin: No rashes, lesions or ulcers Psychiatry: Judgement and insight appear normal. Mood & affect appropriate.     Data Reviewed: I have personally reviewed following labs and imaging studies  CBC: Recent Labs  Lab 08/14/21 1311 08/16/21 2000 08/17/21 0551 08/18/21 0203  WBC 7.9 8.3 7.9 6.1  NEUTROABS  --  7.4  --   --    HGB 16.2* 15.5* 13.7 11.9*  HCT 48.6* 46.9* 42.0 36.8  MCV 90.2 89.7 91.3 88.5  PLT 132* 73* 62* 60*    Basic Metabolic Panel: Recent Labs  Lab 08/14/21 1311 08/16/21 2000 08/17/21 0551 08/18/21 0203  NA 137 133* 134* 135  K 4.9 3.4* 3.4* 3.6  CL 103 99 101 106  CO2 23 22 25 24   GLUCOSE 86 160* 118* 112*  BUN 11 16 19 16   CREATININE 1.02* 1.17* 0.88 1.02*  CALCIUM 9.3 8.8* 8.4* 8.2*  MG  --   --  2.1  --   PHOS  --   --  2.5  --     GFR: Estimated Creatinine Clearance: 71.6 mL/min (A) (by C-G formula based on SCr of 1.02 mg/dL (H)).  Liver Function Tests: Recent Labs  Lab 08/14/21 1455 08/16/21 2000 08/17/21 0551 08/18/21 0203  AST 20 26 23 21   ALT 22 21 20 18   ALKPHOS 80 68 58 51  BILITOT 0.4 1.3* 0.8 0.4  PROT 7.5 7.7 7.0 5.9*  ALBUMIN 4.1 3.8 3.4* 2.9*    CBG: No results for input(s): GLUCAP in the last 168 hours.   Recent Results (from the past 240 hour(s))  Blood culture (routine x 2)     Status: Abnormal (Preliminary result)   Collection Time: 08/16/21  7:33 PM  Specimen: Right Antecubital; Blood  Result Value Ref Range Status   Specimen Description   Final    RIGHT ANTECUBITAL BOTTLES DRAWN AEROBIC AND ANAEROBIC Performed at [facility], [address], [place], [place] [address]    Special Requests   Final    Blood Culture adequate volume Performed at [facility], [address], [place], [place] [address]    Culture  Setup Time   Final    GRAM NEGATIVE RODS Gram Stain Report Called to,Read Back By and Verified With: HENDERSON,I @ [id number] ON 08/17/21 BY JUW AEROBIC BOTTLE ONLY GS DONE @ [facility] CRITICAL RESULT CALLED TO, READ BACK BY AND VERIFIED WITH: PHARMD Baeten LEDFORD 08/18/21@00 :35 BY TW    Culture (A)  Final    KLEBSIELLA PNEUMONIAE CULTURE REINCUBATED FOR BETTER GROWTH Performed at [facility], [place] [address] [place], [place] [address]    Report Status PENDING  Incomplete  Blood Culture ID Panel (Reflexed)     Status:  Abnormal   Collection Time: 08/16/21  7:33 PM  Result Value Ref Range Status   Enterococcus faecalis NOT DETECTED NOT DETECTED Final   Enterococcus Faecium NOT DETECTED NOT DETECTED Final   Listeria monocytogenes NOT DETECTED NOT DETECTED Final   Staphylococcus species NOT DETECTED NOT DETECTED Final   Staphylococcus aureus (BCID) NOT DETECTED NOT DETECTED Final   Staphylococcus epidermidis NOT DETECTED NOT DETECTED Final   Staphylococcus lugdunensis NOT DETECTED NOT DETECTED Final   Streptococcus species NOT DETECTED NOT DETECTED Final   Streptococcus agalactiae NOT DETECTED NOT DETECTED Final   Streptococcus pneumoniae NOT DETECTED NOT DETECTED Final   Streptococcus pyogenes NOT DETECTED NOT DETECTED Final   A.calcoaceticus-baumannii NOT DETECTED NOT DETECTED Final   Bacteroides fragilis NOT DETECTED NOT DETECTED Final   Enterobacterales DETECTED (A) NOT DETECTED Final    Comment: Enterobacterales represent a large order of gram negative bacteria, not a single organism. CRITICAL RESULT CALLED TO, READ BACK BY AND VERIFIED WITH: PHARMD Sliva LEDFORD 08/18/21@00 :35 BY TW    Enterobacter cloacae complex NOT DETECTED NOT DETECTED Final   Escherichia coli NOT DETECTED NOT DETECTED Final   Klebsiella aerogenes NOT DETECTED NOT DETECTED Final   Klebsiella oxytoca NOT DETECTED NOT DETECTED Final   Klebsiella pneumoniae DETECTED (A) NOT DETECTED Final    Comment: CRITICAL RESULT CALLED TO, READ BACK BY AND VERIFIED WITH: PHARMD Richison LEDFORD 08/18/21@00 :35 BY TW    Proteus species NOT DETECTED NOT DETECTED Final   Salmonella species NOT DETECTED NOT DETECTED Final   Serratia marcescens NOT DETECTED NOT DETECTED Final   Haemophilus influenzae NOT DETECTED NOT DETECTED Final   Neisseria meningitidis NOT DETECTED NOT DETECTED Final   Pseudomonas aeruginosa NOT DETECTED NOT DETECTED Final   Stenotrophomonas maltophilia NOT DETECTED NOT DETECTED Final   Candida albicans NOT DETECTED NOT  DETECTED Final   Candida auris NOT DETECTED NOT DETECTED Final   Candida glabrata NOT DETECTED NOT DETECTED Final   Candida krusei NOT DETECTED NOT DETECTED Final   Candida parapsilosis NOT DETECTED NOT DETECTED Final   Candida tropicalis NOT DETECTED NOT DETECTED Final   Cryptococcus neoformans/gattii NOT DETECTED NOT DETECTED Final   CTX-M ESBL NOT DETECTED NOT DETECTED Final   Carbapenem resistance IMP NOT DETECTED NOT DETECTED Final   Carbapenem resistance KPC NOT DETECTED NOT DETECTED Final   Carbapenem resistance NDM NOT DETECTED NOT DETECTED Final   Carbapenem resist OXA 48 LIKE NOT DETECTED NOT DETECTED Final   Carbapenem resistance VIM NOT DETECTED NOT DETECTED Final  Comment: Performed at [facility], [place] [address], [place], [place] [address]  Resp Panel by RT-PCR (Flu A&B, Covid) Nasopharyngeal Swab     Status: None   Collection Time: 08/16/21  7:53 PM   Specimen: Nasopharyngeal Swab; Nasopharyngeal(NP) swabs in vial transport medium  Result Value Ref Range Status   SARS Coronavirus 2 by RT PCR NEGATIVE NEGATIVE Final    Comment: (NOTE) SARS-CoV-2 target nucleic acids are NOT DETECTED.  The SARS-CoV-2 RNA is generally detectable in upper respiratory specimens during the acute phase of infection. The lowest concentration of SARS-CoV-2 viral copies this assay can detect is 138 copies/mL. A negative result does not preclude SARS-Cov-2 infection and should not be used as the sole basis for treatment or other patient management decisions. A negative result may occur with  improper specimen collection/handling, submission of specimen other than nasopharyngeal swab, presence of viral mutation(s) within the areas targeted by this assay, and inadequate number of viral copies(<138 copies/mL). A negative result must be combined with clinical observations, patient history, and epidemiological information. The expected result is Negative.  Fact Sheet for Patients:   EntrepreneurPulse.com.au  Fact Sheet for Healthcare Providers:  IncredibleEmployment.be  This test is no t yet approved or cleared by the [place] FDA and  has been authorized for detection and/or diagnosis of SARS-CoV-2 by FDA under an Emergency Use Authorization (EUA). This EUA will remain  in effect (meaning this test can be used) for the duration of the COVID-19 declaration under Section 564(b)(1) of the Act, 21 U.S.C.section 360bbb-3(b)(1), unless the authorization is terminated  or revoked sooner.       Influenza A by PCR NEGATIVE NEGATIVE Final   Influenza B by PCR NEGATIVE NEGATIVE Final    Comment: (NOTE) The Xpert Xpress SARS-CoV-2/FLU/RSV plus assay is intended as an aid in the diagnosis of influenza from Nasopharyngeal swab specimens and should not be used as a sole basis for treatment. Nasal washings and aspirates are unacceptable for Xpert Xpress SARS-CoV-2/FLU/RSV testing.  Fact Sheet for Patients: EntrepreneurPulse.com.au  Fact Sheet for Healthcare Providers: IncredibleEmployment.be  This test is not yet approved or cleared by the [place] FDA and has been authorized for detection and/or diagnosis of SARS-CoV-2 by FDA under an Emergency Use Authorization (EUA). This EUA will remain in effect (meaning this test can be used) for the duration of the COVID-19 declaration under Section 564(b)(1) of the Act, 21 U.S.C. section 360bbb-3(b)(1), unless the authorization is terminated or revoked.  Performed at [facility], [address], [place], [place] [address]   Blood culture (routine x 2)     Status: None (Preliminary result)   Collection Time: 08/16/21 10:09 PM   Specimen: Left Antecubital; Blood  Result Value Ref Range Status   Specimen Description   Final    LEFT ANTECUBITAL BOTTLES DRAWN AEROBIC AND ANAEROBIC   Special Requests Blood Culture adequate volume  Final   Culture    Final    NO GROWTH 2 DAYS Performed at [facility], [address], [place], [place] [address]    Report Status PENDING  Incomplete         Radiology Studies: ECHOCARDIOGRAM COMPLETE  Result Date: 08/18/2021    ECHOCARDIOGRAM REPORT   Patient Name:   [name] Date of Exam: 08/18/2021 Medical Rec #:  [id number]     Height:       64.0 in Accession #:    [id number]    Weight:       230.0  lb Date of Birth:  05-Oct-1965     BSA:          2.076 m Patient Age:    55 years      BP:           136/76 mmHg Patient Gender: F             HR:           57 bpm. Exam Location:  Inpatient Procedure: 2D Echo, Color Doppler, Cardiac Doppler and Intracardiac            Opacification Agent Indications:    R07.9* Chest pain, unspecified  History:        Patient has prior history of Echocardiogram examinations, most                 recent 03/11/2018. CAD; Risk Factors:Hypertension, Dyslipidemia                 and Sleep Apnea.  Sonographer:    Raquel Sarna Senior RDCS Referring Phys: [place]  Sonographer Comments: Technically difficult due to poor echo windows. IMPRESSIONS  1. Left ventricular ejection fraction, by estimation, is 60 to 65%. The left ventricle has normal function. The left ventricle has no regional wall motion abnormalities. Left ventricular diastolic parameters were normal.  2. Right ventricular systolic function is normal. The right ventricular size is normal.  3. The mitral valve is normal in structure. No evidence of mitral valve regurgitation. No evidence of mitral stenosis.  4. The aortic valve was not well visualized. Aortic valve regurgitation is not visualized. No aortic stenosis is present.  5. The inferior vena cava is normal in size with greater than 50% respiratory variability, suggesting right atrial pressure of 3 mmHg. FINDINGS  Left Ventricle: Left ventricular ejection fraction, by estimation, is 60 to 65%. The left ventricle has normal function. The left ventricle has no regional  wall motion abnormalities. Definity contrast agent was given IV to delineate the left ventricular  endocardial borders. The left ventricular internal cavity size was normal in size. There is no left ventricular hypertrophy. Left ventricular diastolic parameters were normal. Right Ventricle: The right ventricular size is normal. Right vetricular wall thickness was not well visualized. Right ventricular systolic function is normal. Left Atrium: Left atrial size was normal in size. Right Atrium: Right atrial size was normal in size. Pericardium: There is no evidence of pericardial effusion. Mitral Valve: The mitral valve is normal in structure. No evidence of mitral valve regurgitation. No evidence of mitral valve stenosis. Tricuspid Valve: The tricuspid valve is normal in structure. Tricuspid valve regurgitation is trivial. No evidence of tricuspid stenosis. Aortic Valve: The aortic valve was not well visualized. Aortic valve regurgitation is not visualized. No aortic stenosis is present. Pulmonic Valve: The pulmonic valve was not well visualized. Pulmonic valve regurgitation is not visualized. No evidence of pulmonic stenosis. Aorta: The aortic root is normal in size and structure. Venous: The inferior vena cava is normal in size with greater than 50% respiratory variability, suggesting right atrial pressure of 3 mmHg. IAS/Shunts: The interatrial septum was not well visualized.  LEFT VENTRICLE PLAX 2D LVIDd:         3.60 cm   Diastology LVIDs:         2.60 cm   LV e' medial:    7.83 cm/s LV PW:         0.90 cm   LV E/e' medial:  12.6 LV IVS:  1.20 cm   LV e' lateral:   13.10 cm/s LVOT diam:     1.90 cm   LV E/e' lateral: 7.5 LV SV:         66 LV SV Index:   32 LVOT Area:     2.84 cm  RIGHT VENTRICLE RV S prime:     9.68 cm/s TAPSE (M-mode): 2.3 cm LEFT ATRIUM             Index        RIGHT ATRIUM           Index LA diam:        3.30 cm 1.59 cm/m   RA Area:     14.70 cm LA Vol (A2C):   58.3 ml 28.08 ml/m  RA  Volume:   32.70 ml  15.75 ml/m LA Vol (A4C):   42.5 ml 20.47 ml/m LA Biplane Vol: 49.6 ml 23.89 ml/m  AORTIC VALVE LVOT Vmax:   93.40 cm/s LVOT Vmean:  70.100 cm/s LVOT VTI:    0.233 m  AORTA Ao Root diam: 3.00 cm MITRAL VALVE MV Area (PHT): 2.70 cm    SHUNTS MV Decel Time: 281 msec    Systemic VTI:  0.23 m MV E velocity: 98.50 cm/s  Systemic Diam: 1.90 cm MV A velocity: 74.90 cm/s MV E/A ratio:  1.32 Kirk Ruths MD Electronically signed by Kirk Ruths MD Signature Date/Time: 08/18/2021/3:37:16 PM    Final         Scheduled Meds:  aspirin EC  81 mg Oral Daily   bisoprolol  2.5 mg Oral Daily   escitalopram  20 mg Oral Q2000   fluticasone  2 spray Each Nare QHS   nicotine  21 mg Transdermal Daily   pantoprazole  40 mg Oral Daily   rosuvastatin  20 mg Oral QHS   Continuous Infusions:  sodium chloride     cefTRIAXone (ROCEPHIN)  IV 2 g (08/18/21 0154)   sodium chloride       LOS: 0 days    Time spent: 40 minutes    Irine Seal, MD [facility]   To contact the attending provider between 7A-7P or the covering provider during after hours 7P-7A, please log into the web site [email or web address] and access using universal [place] password for that web site. If you do not have the password, please call the hospital operator.  08/18/2021, 4:15 PM

## 2021-08-18 NOTE — Progress Notes (Signed)
Echocardiogram 2D Echocardiogram has been performed.  Oneal Deputy Amanee Iacovelli RDCS 08/18/2021, 3:28 PM

## 2021-08-18 NOTE — Progress Notes (Signed)
PHARMACY - PHYSICIAN COMMUNICATION CRITICAL VALUE ALERT - BLOOD CULTURE IDENTIFICATION (BCID)  Shannon [name] is an 55 y.o. female who presented to [facility] on 08/16/2021 with a chief complaint of hypotension, chills  Assessment:  WBC WNL, still having some low BP, afebrile  Name of physician (or Provider) Contacted: Dr. Cyd Silence  Current antibiotics: None  Changes to prescribed antibiotics recommended:  Start Ceftriaxone 2g IV q24h  Results for orders placed or performed during the hospital encounter of 08/16/21  Blood Culture ID Panel (Reflexed) (Collected: 08/16/2021  7:33 PM)  Result Value Ref Range   Enterococcus faecalis NOT DETECTED NOT DETECTED   Enterococcus Faecium NOT DETECTED NOT DETECTED   Listeria monocytogenes NOT DETECTED NOT DETECTED   Staphylococcus species NOT DETECTED NOT DETECTED   Staphylococcus aureus (BCID) NOT DETECTED NOT DETECTED   Staphylococcus epidermidis NOT DETECTED NOT DETECTED   Staphylococcus lugdunensis NOT DETECTED NOT DETECTED   Streptococcus species NOT DETECTED NOT DETECTED   Streptococcus agalactiae NOT DETECTED NOT DETECTED   Streptococcus pneumoniae NOT DETECTED NOT DETECTED   Streptococcus pyogenes NOT DETECTED NOT DETECTED   A.calcoaceticus-baumannii NOT DETECTED NOT DETECTED   Bacteroides fragilis NOT DETECTED NOT DETECTED   Enterobacterales DETECTED (A) NOT DETECTED   Enterobacter cloacae complex NOT DETECTED NOT DETECTED   Escherichia coli NOT DETECTED NOT DETECTED   Klebsiella aerogenes NOT DETECTED NOT DETECTED   Klebsiella oxytoca NOT DETECTED NOT DETECTED   Klebsiella pneumoniae DETECTED (A) NOT DETECTED   Proteus species NOT DETECTED NOT DETECTED   Salmonella species NOT DETECTED NOT DETECTED   Serratia marcescens NOT DETECTED NOT DETECTED   Haemophilus influenzae NOT DETECTED NOT DETECTED   Neisseria meningitidis NOT DETECTED NOT DETECTED   Pseudomonas aeruginosa NOT DETECTED NOT DETECTED   Stenotrophomonas maltophilia  NOT DETECTED NOT DETECTED   Candida albicans NOT DETECTED NOT DETECTED   Candida auris NOT DETECTED NOT DETECTED   Candida glabrata NOT DETECTED NOT DETECTED   Candida krusei NOT DETECTED NOT DETECTED   Candida parapsilosis NOT DETECTED NOT DETECTED   Candida tropicalis NOT DETECTED NOT DETECTED   Cryptococcus neoformans/gattii NOT DETECTED NOT DETECTED   CTX-M ESBL NOT DETECTED NOT DETECTED   Carbapenem resistance IMP NOT DETECTED NOT DETECTED   Carbapenem resistance KPC NOT DETECTED NOT DETECTED   Carbapenem resistance NDM NOT DETECTED NOT DETECTED   Carbapenem resist OXA 48 LIKE NOT DETECTED NOT DETECTED   Carbapenem resistance VIM NOT DETECTED NOT DETECTED    [name], [name] 08/18/2021  12:39 AM

## 2021-08-18 NOTE — Consult Note (Addendum)
Cardiology Consultation:   Patient ID: EMALYNN CLEWIS MRN: [id number]; DOB: Nov 09, 1965  Admit date: 08/16/2021 Date of Consult: 08/18/2021  PCP:  Carolee Rota, NP   [facility] Providers Cardiologist: Previously Dr. Bronson Ing, more recently seen by B. Strader PA-C Click here to update MD or APP on Care Team, Refresh:1}     Patient Profile:   Shannon [name] is a 55 y.o. female with a hx of CAD (s/p DES to LCx in 2014, cath in 07/2018 showing patent stent with mild to moderate nonobstructive disease involving the mid-LAD and RCA), carotid artery stenosis (known LICA occlusion followed by vascular surgery), asthma, depression, fibromyalgia, facet arthritis, GERD, obesity, pulmonary nodule, HTN, HLD, OSA and tobacco use who is being seen 08/18/2021 for the evaluation of chest pain at the request of Dr. Grandville Silos.  History of Present Illness:   Ms. [name] follows with our [facility] team with prior PCI and last cath as above. She had a stress test in 11/2020 done for pre-op evaluation showing small, mild intensity, mid to apical anteroseptal defect that is partially reversible suggestive of either variable soft tissue attenuation or a mild ischemic territory, low risk, EF 69%. She was cleared for hip surgery and did well from cardiac perspective from this.  She was seen in the ED 08/14/21 with mid thoracic back pain radiating into her chest associated with a heavy sensation. This had started the night before and persisted for several hours into that day, constant in nature without specific provoking or relieving factor. She had a hard time differentiating whether it was related to her chronic MSK issues or cardiac problems. Troponins were negative. T spine film showed mild to moderate multilevel thoracic spondylosis. D-dimer was mildly elevated prompting CT scan showing no significant PE, + lingular nodule 82mm increased in size from prior exam with scan recommending to consider repeat chest CT,  follow-up PET-CT or tissue sampling. She is already in chronic pain management and uses oxycodone as needed so lidoderm patch was added. Over the next few days she continued to feel worse with chills, body aches, headache, fatigue and weakness. She felt disoriented and "out of it." Her BP was running lower than normal. Upon arrival here, labs pertinent for hsTroponin 23->14, hyponatremia of 133, hypokalemia 3.4, elevated Cr 1.17, elevated TBili 1.3, normal lactate, Hgb 15.5 (downtrending to 11.9 today), worsening thrombocytopenia (73->62->60, previously 120-140 range), normal LDH. Covid/flu panel negative. Blood cultures positive for enterobacterales/klebsiella, prompting initiation of ceftriaxone. 2D echo has been ordered. She reports her last true episode of CP was 08/14/21 and she has not felt this since. Overall she is feeling better this morning but she does note some burning sensation in her right hip this morning.    Past Medical History:  Diagnosis Date   Anxiety    Arthritis    oa needs hip replacement on right   Asthma    allergies    Cancer ([place])    squamous cell areas removed from vulva and pre caner areas removed from leg    Carotid artery occlusion    Complication of anesthesia    major depression after general anesthesia   Coronary artery disease    LHC 11/25/12 90% stenosis mid LCx & otherwise nonobstructive dz w/ EF 60-65% S/p PTCA/DES to LCx   Depression    Dyspnea    with exertion    Elevated cholesterol    Exertional dyspnea    chronic   Fibromyalgia    GERD (gastroesophageal reflux disease)  History of cardiovascular stress test 06/2015   low risk   HPV in female    Hyperlipidemia    Hypertension    MI (myocardial infarction) ([place]) fe 17, 2014   Obesity    Pneumonia    hx of x 2 years ago    Polycystic ovary disease    Skin tear of upper arm without complication, left, sequela    small skin tear left upper arm no drainage for 1 week   Sleep apnea    mild no  cpap    Tobacco abuse     Past Surgical History:  Procedure Laterality Date   CORONARY ANGIOPLASTY WITH STENT PLACEMENT     LEFT HEART CATH AND CORONARY ANGIOGRAPHY N/A 08/09/2018   Procedure: LEFT HEART CATH AND CORONARY ANGIOGRAPHY;  Surgeon: Nelva Bush, MD;  Location: [facility];  Service: Cardiovascular;  Laterality: N/A;   LEFT HEART CATHETERIZATION WITH CORONARY ANGIOGRAM N/A 11/15/2012   Procedure: LEFT HEART CATHETERIZATION WITH CORONARY ANGIOGRAM;  Surgeon: Thayer Headings, MD;  Location: [facility] CATH LAB;  Service: Cardiovascular;  Laterality: N/A;   PERCUTANEOUS CORONARY STENT INTERVENTION (PCI-S)  11/15/2012   Procedure: PERCUTANEOUS CORONARY STENT INTERVENTION (PCI-S);  Surgeon: Thayer Headings, MD;  Location: [facility] CATH LAB;  Service: Cardiovascular;;   skin cancer removed right lower leg Right    TONSILLECTOMY     TOTAL HIP ARTHROPLASTY Right 01/16/2021   Procedure: TOTAL HIP ARTHROPLASTY ANTERIOR APPROACH;  Surgeon: Gaynelle Arabian, MD;  Location: [facility];  Service: Orthopedics;  Laterality: Right;  130min   uterus ablation     VULVECTOMY N/A 11/08/2019   Procedure: excision of VULVA, vulvoscopy;  Surgeon: Dian Queen, MD;  Location: [place];  Service: Gynecology;  Laterality: N/A;     Home Medications:  Prior to Admission medications   Medication Sig Start Date End Date Taking? Authorizing Provider  albuterol (PROVENTIL HFA;VENTOLIN HFA) 108 (90 Base) MCG/ACT inhaler Inhale 2 puffs into the lungs every 4 (four) hours as needed for wheezing or shortness of breath. 12/21/15  Yes Ward, Delice Bison, DO  ALPRAZolam (XANAX) 1 MG tablet Take 1 mg by mouth 2 (two) times daily as needed for anxiety. 06/12/17  Yes [provider]  aspirin EC 81 MG tablet Take 81 mg by mouth daily. Swallow whole.   Yes [provider]  bisoprolol-hydrochlorothiazide (ZIAC) 5-6.25 MG tablet Take 1 tablet by mouth daily. Pt takes in the pm 12/21/19  Yes [provider]  escitalopram (LEXAPRO) 20 MG tablet Take 20 mg by mouth daily. Takes in the pm   Yes [provider]  fluticasone (FLONASE) 50 MCG/ACT nasal spray Place 2 sprays into both nostrils at bedtime.   Yes [provider]  nitroGLYCERIN (NITROSTAT) 0.4 MG SL tablet Place 1 tablet (0.4 mg total) under the tongue every 5 (five) minutes as needed for chest pain (up to 3 doses). 07/03/21  Yes Strader, [place] M, PA-C  Oxycodone HCl 10 MG TABS Take 10 mg by mouth 4 (four) times daily as needed (pain). 02/15/20  Yes [provider]  pantoprazole (PROTONIX) 40 MG tablet Take 40 mg by mouth daily.   Yes [provider]  rosuvastatin (CRESTOR) 20 MG tablet Take 1 tablet (20 mg total) by mouth at bedtime. 08/10/18  Yes Isaiah Serge, NP  triamcinolone cream (KENALOG) 0.1 % Apply 1 application topically 2 (two) times daily. Patient taking differently: Apply 1 application topically 2 (two) times daily as needed (irritation).  01/09/20  Yes Scot Jun, FNP  azithromycin (ZITHROMAX) 250 MG tablet Take 1 tablet (250 mg total) by mouth daily. Take first 2 tablets together, then 1 every day until finished. Patient not taking: Reported on 07/03/2021 04/05/21   Wurst, [place], PA-C  benzonatate (TESSALON) 100 MG capsule Take 1 capsule (100 mg total) by mouth every 8 (eight) hours. Patient not taking: Reported on 07/03/2021 04/05/21   Stacey Drain, [place], PA-C  HYDROmorphone (DILAUDID) 2 MG tablet Take 1-2 tablets (2-4 mg total) by mouth every 6 (six) hours as needed for severe pain. Patient not taking: Reported on 07/03/2021 01/17/21   Fenton Foy D, PA-C  lidocaine (LIDODERM) 5 % Place 1 patch onto the skin daily. Remove & Discard patch within 12 hours or as directed by MD Patient not taking: Reported on 08/16/2021 08/14/21   Evalee Jefferson, PA-C  methocarbamol (ROBAXIN) 500 MG tablet Take 1 tablet (500 mg total) by mouth every 6 (six) hours as needed for muscle  spasms. Patient not taking: Reported on 07/03/2021 01/17/21   Jonnie Kind, PA-C    Inpatient Medications: Scheduled Meds:  aspirin EC  81 mg Oral Daily   bisoprolol  5 mg Oral Daily   escitalopram  20 mg Oral Q2000   nicotine  21 mg Transdermal Daily   pantoprazole  40 mg Oral Daily   rosuvastatin  20 mg Oral QHS   Continuous Infusions:  cefTRIAXone (ROCEPHIN)  IV 2 g (08/18/21 0154)   PRN Meds: acetaminophen, albuterol, ALPRAZolam  Allergies:    Allergies  Allergen Reactions   Clomiphene Citrate Nausea And Vomiting   Morphine And Related Other (See Comments)    PATIENT REFUSES THIS MEDICATION: states that she does not tolerate this medication well   Sulfa Antibiotics Other (See Comments)    Not sure ? rash   Clomiphene Rash    Social History:   Social History   Socioeconomic History   Marital status: Legally Separated    Spouse name: Not on file   Number of children: Not on file   Years of education: Not on file   Highest education level: Not on file  Occupational History   Not on file  Tobacco Use   Smoking status: Every Day    Packs/day: 2.00    Years: 40.00    Pack years: 80.00    Types: Cigarettes    Start date: 03/29/1979   Smokeless tobacco: Never   Tobacco comments:    Currently 2ppd  Vaping Use   Vaping Use: Never used  Substance and Sexual Activity   Alcohol use: Not Currently    Comment: rare   Drug use: No   Sexual activity: Not Currently    Partners: Male  Other Topics Concern   Not on file  Social History Narrative   Lives with husband in a 3 story home.  Has no children.     On disability.     Education: high school, some college.   Social Determinants of Health   Financial Resource Strain: Not on file  Food Insecurity: Not on file  Transportation Needs: Not on file  Physical Activity: Not on file  Stress: Not on file  Social Connections: Not on file  Intimate Partner Violence: Not on file    Family History:    Family  History  Problem Relation Age of Onset   Depression Mother    Anxiety disorder Maternal Grandmother    Anxiety disorder Paternal Grandmother    OCD Paternal Grandmother  Depression Paternal Grandmother    Heart attack Father        Deceased, 62   Cerebral aneurysm Father    Heart disease Father      ROS:  Please see the history of present illness.   All other ROS reviewed and negative.     Physical Exam/Data:   Vitals:   08/17/21 2019 08/18/21 0500 08/18/21 0849 08/18/21 1003  BP: (!) 97/52 114/69 (!) 142/82 136/76  Pulse: 70 (!) 58 (!) 59 66  Resp: 18 18 20 20   Temp:  98 F (36.7 C) 98 F (36.7 C) 97.7 F (36.5 C)  TempSrc:  Oral Oral Oral  SpO2: 99% 98% 99% 100%  Weight:      Height:        Intake/Output Summary (Last 24 hours) at 08/18/2021 1024 Last data filed at 08/18/2021 0300 Gross per 24 hour  Intake 100 ml  Output --  Net 100 ml   Last 3 Weights 08/16/2021 08/14/2021 07/03/2021  Weight (lbs) 230 lb 230 lb 229 lb  Weight (kg) 104.327 kg 104.327 kg 103.874 kg     Body mass index is 39.48 kg/m.  General: Well developed, well nourished WF, in no acute distress. Head: Normocephalic, atraumatic, sclera non-icteric, no xanthomas, nares are without discharge. Neck: Negative for carotid bruits. JVP not elevated. Lungs: Mildly diffusely diminished without wheezes, rales, or rhonchi. Breathing is unlabored. Heart: RRR S1 S2 without murmurs, rubs, or gallops.  Abdomen: Soft, non-tender, non-distended with normoactive bowel sounds. No rebound/guarding. Extremities: No clubbing or cyanosis. No edema. Distal pedal pulses are 2+ and equal bilaterally. Neuro: Alert and oriented X 3. Moves all extremities spontaneously. Psych:  Responds to questions appropriately with a normal affect.   EKG:  The EKG was personally reviewed and demonstrates:   SB 57bpm, subtle ST upsloping inferiorly, no acute STT changes - similar to prior baseline EKG from 11/16 reviewed and  appears similarly  Telemetry:  Telemetry was personally reviewed and demonstrates:  NSR/SB  Relevant CV Studies: Cath 07/2018 Conclusions: Mild to moderate, non-obstructive coronary artery disease involving the mid LAD and RCA. Widely patent proximal/mid LCx stent. Normal left ventricular contraction with mildly elevated filling pressure.   Recommendations: Medical therapy and aggressive secondary prevention, including smoking cessation, weight loss, and lipid control.  Consider increasing statin therapy if LDL not at goal (<70). Start furosemide 20 mg daily for component of diastolic heart failure.   Recommend Aspirin 81mg  daily for moderate CAD.    Nelva Bush, MD [facility] Pager: [phone]  2D echo 02/2018  - Procedure narrative: Transthoracic echocardiography. Image    quality was suboptimal. Contrast enhancement was employed.  - Left ventricle: The cavity size was normal. Wall thickness was    increased in a pattern of mild LVH. Systolic function was normal.    The estimated ejection fraction was in the range of 55% to 60%.    Wall motion was normal; there were no regional wall motion    abnormalities. Left ventricular diastolic function parameters    were normal.     Laboratory Data:  High Sensitivity Troponin:   Recent Labs  Lab 08/14/21 1311 08/14/21 1455 08/16/21 2000 08/16/21 2342  TROPONINIHS 2 3 23* 14     Chemistry Recent Labs  Lab 08/16/21 2000 08/17/21 0551 08/18/21 0203  NA 133* 134* 135  K 3.4* 3.4* 3.6  CL 99 101 106  CO2 22 25 24   GLUCOSE 160* 118* 112*  BUN 16 19 16  CREATININE 1.17* 0.88 1.02*  CALCIUM 8.8* 8.4* 8.2*  MG  --  2.1  --   GFRNONAA 54* >60 >60  ANIONGAP 12 8 5     Recent Labs  Lab 08/16/21 2000 08/17/21 0551 08/18/21 0203  PROT 7.7 7.0 5.9*  ALBUMIN 3.8 3.4* 2.9*  AST 26 23 21   ALT 21 20 18   ALKPHOS 68 58 51  BILITOT 1.3* 0.8 0.4   Lipids No results for input(s): CHOL, TRIG, HDL, LABVLDL, LDLCALC,  CHOLHDL in the last 168 hours.  Hematology Recent Labs  Lab 08/16/21 2000 08/17/21 0551 08/18/21 0203  WBC 8.3 7.9 6.1  RBC 5.23* 4.60 4.16  HGB 15.5* 13.7 11.9*  HCT 46.9* 42.0 36.8  MCV 89.7 91.3 88.5  MCH 29.6 29.8 28.6  MCHC 33.0 32.6 32.3  RDW 14.4 14.6 14.4  PLT 73* 62* 60*   Thyroid No results for input(s): TSH, FREET4 in the last 168 hours.  BNPNo results for input(s): BNP, PROBNP in the last 168 hours.  DDimer  Recent Labs  Lab 08/14/21 1455  DDIMER 0.68*     Radiology/Studies:  DG Chest 2 View  Result Date: 08/14/2021 CLINICAL DATA:  chest pain EXAM: CHEST - 2 VIEW COMPARISON:  November 15, 2020. FINDINGS: The heart size and mediastinal contours are within normal limits. Both lungs are clear. No visible pleural effusions or pneumothorax. No acute osseous abnormality. Thoracic spine degenerative change IMPRESSION: No evidence of acute cardiopulmonary disease. Electronically Signed   By: Margaretha Sheffield M.D.   On: 08/14/2021 13:56   DG Thoracic Spine 2 View  Result Date: 08/14/2021 CLINICAL DATA:  Right sided back pain EXAM: THORACIC SPINE 2 VIEWS COMPARISON:  11/15/2020 FINDINGS: No evidence of acute fracture. No traumatic listhesis. Multilevel intervertebral disc height loss with endplate spurring throughout the thoracic spine, most pronounced within the midthoracic spine IMPRESSION: Mild to moderate multilevel thoracic spondylosis. No acute findings. Electronically Signed   By: Davina Poke D.O.   On: 08/14/2021 15:15   CT Angio Chest PE W and/or Wo Contrast  Result Date: 08/14/2021 CLINICAL DATA:  Chest pain and shortness of breath EXAM: CT ANGIOGRAPHY CHEST WITH CONTRAST TECHNIQUE: Multidetector CT imaging of the chest was performed using the standard protocol during bolus administration of intravenous contrast. Multiplanar CT image reconstructions and MIPs were obtained to evaluate the vascular anatomy. CONTRAST:  151mL OMNIPAQUE IOHEXOL 350 MG/ML SOLN  COMPARISON:  Chest x-ray from earlier in the same day, CT from 12/18/2019. FINDINGS: Cardiovascular: Thoracic aorta demonstrates atherosclerotic calcifications without aneurysmal dilatation or dissection. No cardiac enlargement is noted. Heavy coronary calcifications are noted. The pulmonary artery shows a normal branching pattern. No filling defect is identified to suggest pulmonary embolism. Mediastinum/Nodes: Thoracic inlet is within normal limits. No sizable hilar or mediastinal adenopathy is noted. The esophagus as visualized is within normal limits. Lungs/Pleura: There is a focal soft tissue lingular nodule identified which measures 16 by 8 by 6 mm in greatest dimension. This has increased in size from the prior exam at which time it measured approximately 10 mm in greatest dimension. Some subpleural scarring is noted. No other sizable parenchymal nodules are seen. Upper Abdomen: Visualized upper abdomen shows a tiny hypodensity within the right lobe of the liver best seen on image number 270 of series 5 likely representing a small cyst. This is stable from the prior exam. Musculoskeletal: Degenerative changes of the thoracic spine are noted. No acute rib abnormality is seen. Review of the MIP images confirms the above findings.  IMPRESSION: No evidence of pulmonary emboli. Significant coronary atherosclerotic changes. Lingular nodule which measures 11 mm in mean diameter which has increased in size when compared with the prior exam. Consider one of the following in 3 months for both low-risk and high-risk individuals: (a) repeat chest CT, (b) follow-up PET-CT, or (c) tissue sampling. This recommendation follows the consensus statement: Guidelines for Management of Incidental Pulmonary Nodules Detected on CT Images: From the Fleischner Society 2017; Radiology 2017; 284:228-243. Aortic Atherosclerosis (ICD10-I70.0). Electronically Signed   By: Inez Catalina M.D.   On: 08/14/2021 17:35     Assessment and Plan:    1. Flu-like symptoms with malaise, weakness, body aches, headache, fatigue - pertinent findings include positive blood cultures (Enterobacterales, Klebsiella) and significant worsening of thrombocytopenia - hemoglobin is also downtrending from 16.2->15.5->13.7->11.9, question whether initial values were due to hemoconcentration but nevertheless needs to be monitored/evaluated - suspect this is the reason for her symptoms - further management per primary team  2. Chest pain with known history of CAD - atypical - very reassuring that bulk of symptoms occurred for several hours 11/15-11/16 with negative troponins upon first ED presentation - f/u values here reassuring/flat at 23->14; IM has ordered 3rd value which is pending - patient denies any residual CP - continue ASA, rosuvastatin - restart half dose home bisoprolol dose to avoid beta blocker rebound (without HCTZ given recent hypotension) - await echocardiogram is pending but otherwise do not anticipate further cardiac workup as inpatient; can follow up as OP - Joaquim Tolen send msg to office to call her with follow-up  3. Essential HTN here with hypotension - suspect hypotension related to infection - BP now trending 130s-140s - Takeysha Bonk resume half dose home BB as above but otherwise continue to hold HCTZ - continue to titrate beta blocker back to home dose as able  4. Hyperlipidemia - continue statin  5. Pulmonary nodule - seen on CT scan - further per primary team  6. Tobacco abuse - cessation advised  Risk Assessment/Risk Scores:     HEAR Score (for undifferentiated chest pain):  HEAR Score: 5   For questions or updates, please contact [place] Please consult [email or web address] for contact info under    Signed, Charlie Pitter, PA-C  08/18/2021 10:24 AM  I have seen and examined this patient with Melina Copa.  Agree with above, note added to reflect my findings.  On exam, RRR, no murmurs, lungs clear.  Patient presented to the  hospital with feelings of fatigue.  She previously presented to the emergency room 08/14/2021 with thoracic pain that radiated to her chest.  On admission to the hospital currently, she was found to have bacteremia.  She has been started on antibiotics.  She is currently chest pain-free and feeling well.  She has had a an extensive work-up in the past prior to surgery with a low risk Myoview.  At this point, would continue with current management.  We Jahred Tatar repeat her transthoracic echo.  We Eileen Kangas also restart her bisoprolol which can be titrated up to her home dose as blood pressure allows.  We Zamari Bonsall arrange for follow-up in cardiology clinic.  [place] Warriner M. Lejla Moeser MD 08/18/2021 11:36 AM

## 2021-08-19 DIAGNOSIS — R079 Chest pain, unspecified: Secondary | ICD-10-CM

## 2021-08-19 DIAGNOSIS — K750 Abscess of liver: Principal | ICD-10-CM

## 2021-08-19 LAB — CBC WITH DIFFERENTIAL/PLATELET
Abs Immature Granulocytes: 0.02 10*3/uL (ref 0.00–0.07)
Basophils Absolute: 0 10*3/uL (ref 0.0–0.1)
Basophils Relative: 1 %
Eosinophils Absolute: 0.1 10*3/uL (ref 0.0–0.5)
Eosinophils Relative: 2 %
HCT: 39.8 % (ref 36.0–46.0)
Hemoglobin: 12.8 g/dL (ref 12.0–15.0)
Immature Granulocytes: 0 %
Lymphocytes Relative: 35 %
Lymphs Abs: 2 10*3/uL (ref 0.7–4.0)
MCH: 28.6 pg (ref 26.0–34.0)
MCHC: 32.2 g/dL (ref 30.0–36.0)
MCV: 88.8 fL (ref 80.0–100.0)
Monocytes Absolute: 0.6 10*3/uL (ref 0.1–1.0)
Monocytes Relative: 10 %
Neutro Abs: 3.1 10*3/uL (ref 1.7–7.7)
Neutrophils Relative %: 52 %
Platelets: 79 10*3/uL — ABNORMAL LOW (ref 150–400)
RBC: 4.48 MIL/uL (ref 3.87–5.11)
RDW: 14.2 % (ref 11.5–15.5)
WBC: 5.8 10*3/uL (ref 4.0–10.5)
nRBC: 0 % (ref 0.0–0.2)

## 2021-08-19 LAB — COMPREHENSIVE METABOLIC PANEL
ALT: 20 U/L (ref 0–44)
AST: 19 U/L (ref 15–41)
Albumin: 3.1 g/dL — ABNORMAL LOW (ref 3.5–5.0)
Alkaline Phosphatase: 55 U/L (ref 38–126)
Anion gap: 7 (ref 5–15)
BUN: 15 mg/dL (ref 6–20)
CO2: 21 mmol/L — ABNORMAL LOW (ref 22–32)
Calcium: 8.4 mg/dL — ABNORMAL LOW (ref 8.9–10.3)
Chloride: 106 mmol/L (ref 98–111)
Creatinine, Ser: 0.81 mg/dL (ref 0.44–1.00)
GFR, Estimated: >60 mL/min (ref >60)
Glucose, Bld: 95 mg/dL (ref 70–99)
Potassium: 4 mmol/L (ref 3.5–5.1)
Sodium: 134 mmol/L — ABNORMAL LOW (ref 135–145)
Total Bilirubin: 0.3 mg/dL (ref 0.3–1.2)
Total Protein: 6.4 g/dL — ABNORMAL LOW (ref 6.5–8.1)

## 2021-08-19 LAB — MAGNESIUM: Magnesium: 2.1 mg/dL (ref 1.7–2.4)

## 2021-08-19 MED ORDER — LORAZEPAM 2 MG/ML IJ SOLN: 1.0000 mg | Freq: Once | INTRAMUSCULAR | Status: DC

## 2021-08-19 MED ORDER — SODIUM CHLORIDE 0.9 % IV SOLN: INTRAVENOUS | Status: DC | PRN

## 2021-08-19 MED ORDER — DIAZEPAM 5 MG/ML IJ SOLN
5.0000 mg | INTRAMUSCULAR | Status: AC
  Administered 2021-08-20: 5 mg via INTRAVENOUS
  Filled 2021-08-19: qty 2

## 2021-08-19 MED ORDER — LOPERAMIDE HCL 2 MG PO CAPS
4.0000 mg | ORAL_CAPSULE | Freq: Once | ORAL | Status: AC
  Administered 2021-08-19: 4 mg via ORAL
  Filled 2021-08-19: qty 2

## 2021-08-19 NOTE — Consult Note (Addendum)
[facility]    Date of Admission:  08/16/2021     Total days of antibiotics 3               Reason for Consult: Klebsiella Bacteremia    Referring Provider: Dr. Louanne Belton Primary Care Provider: Carolee Rota, NP   ASSESSMENT:  Shannon [name] is a 55 y/o female admitted with weakness and fatigue and found to have Klebsiella pneumoniae bacteremia in 1 out of 4 bottles and CT abdomen imaging with concern for potential hepatic abscess. Given this is a chronic lesion it is less likely that this is infectious. Agree with recommendation for MRI to further evaluate this lesion as it will aid in determination of duration of antibiotic treatment. Thrombocytopenia appears stable to mildly improved. Will continue with ceftriaxone pending further imaging.   PLAN:  Continue ceftriaxone  MRI abdomen to further evaluate hepatic lesion/abscess Remaining medical and supportive care per primary team.  ID will continue to follow.    Principal Problem:   Bacteremia due to Klebsiella pneumoniae Active Problems:   Coronary artery disease   Mixed hyperlipidemia   Essential hypertension   GERD (gastroesophageal reflux disease)   Atypical chest pain   Hypotension   Thrombocytopenia (HCC)   Hyperglycemia   Hyponatremia   Hypokalemia   Elevated troponin   Obesity (BMI 30-39.9)   Asthma   Anxiety   Chest pain    aspirin EC  81 mg Oral Daily   bisoprolol  2.5 mg Oral Daily   escitalopram  20 mg Oral Q2000   fluticasone  2 spray Each Nare QHS   nicotine  21 mg Transdermal Daily   pantoprazole  40 mg Oral Daily   rosuvastatin  20 mg Oral QHS     HPI: Shannon [name] is a 55 y.o. female with previous medical history of CAD, hypertension, asthma and anxiety admitted with weakness and fatigue.  Shannon [name] was previously seen in the ED on 08/14/21 with exacerbation of chronic midline thoracic back pain and radiation to her chest CT showing no significant findings.  Returned to the ED on 08/16/21  with continued generalized fatigue, low energy, and confusion. CT abdomen wit previously seen hypodensity within the right lobe of the liver stable over 1 year now with sligh increase in size with peripheral enhancement suspicious for hepatic abscess. Lab work with elevated troponin of 23, hypokalemia, hyperglycemia and thrombocytopenia. Blood cultures drawn to check for infection.   [name] has been afebrile since admission with no leukocytosis. Currently on Day 3 of ceftriaxone. Blood culture positive for Klebseilla pneumoniae with sensitivities that remain pending. Denies any new pains. Has concern that her mother had pancreatic cancer. Feeling better since arrival to the hospital. ID has been consulted for antibiotic recommendations.   Review of Systems: Review of Systems  Constitutional:  Negative for chills, fever and weight loss.  Respiratory:  Negative for cough, shortness of breath and wheezing.   Cardiovascular:  Negative for chest pain and leg swelling.  Gastrointestinal:  Negative for abdominal pain, constipation, diarrhea, nausea and vomiting.  Skin:  Negative for rash.    Past Medical History:  Diagnosis Date   Anxiety    Arthritis    oa needs hip replacement on right   Asthma    allergies    Cancer ([place])    squamous cell areas removed from vulva and pre caner areas removed from leg    Carotid artery occlusion  Complication of anesthesia    major depression after general anesthesia   Coronary artery disease    LHC 11/25/12 90% stenosis mid LCx & otherwise nonobstructive dz w/ EF 60-65% S/p PTCA/DES to LCx   Depression    Dyspnea    with exertion    Elevated cholesterol    Exertional dyspnea    chronic   Fibromyalgia    GERD (gastroesophageal reflux disease)    History of cardiovascular stress test 06/2015   low risk   HPV in female    Hyperlipidemia    Hypertension    MI (myocardial infarction) ([place]) fe 17, 2014   Obesity     Pneumonia    hx of x 2 years ago    Polycystic ovary disease    Skin tear of upper arm without complication, left, sequela    small skin tear left upper arm no drainage for 1 week   Sleep apnea    mild no cpap    Tobacco abuse     Social History   Tobacco Use   Smoking status: Every Day    Packs/day: 2.00    Years: 40.00    Pack years: 80.00    Types: Cigarettes    Start date: 03/29/1979   Smokeless tobacco: Never   Tobacco comments:    Currently 2ppd  Vaping Use   Vaping Use: Never used  Substance Use Topics   Alcohol use: Not Currently    Comment: rare   Drug use: No    Family History  Problem Relation Age of Onset   Depression Mother    Anxiety disorder Maternal Grandmother    Anxiety disorder Paternal Grandmother    OCD Paternal Grandmother    Depression Paternal Grandmother    Heart attack Father        Deceased, 21   Cerebral aneurysm Father    Heart disease Father     Allergies  Allergen Reactions   Clomiphene Citrate Nausea And Vomiting   Morphine And Related Other (See Comments)    PATIENT REFUSES THIS MEDICATION: states that she does not tolerate this medication well   Sulfa Antibiotics Other (See Comments)    Not sure ? rash   Clomiphene Rash    OBJECTIVE: Blood pressure 123/74, pulse 60, temperature 97.9 F (36.6 C), temperature source Oral, resp. rate 20, height 5\' 4"  (1.626 m), weight 104.3 kg, SpO2 96 %.  Physical Exam Constitutional:      General: She is not in acute distress.    Appearance: She is well-developed. She is obese.  Cardiovascular:     Rate and Rhythm: Normal rate and regular rhythm.     Heart sounds: Normal heart sounds.  Pulmonary:     Effort: Pulmonary effort is normal.     Breath sounds: Normal breath sounds.  Abdominal:     General: There is no distension.     Palpations: There is no mass.     Tenderness: There is no abdominal tenderness. There is no guarding or rebound.  Skin:    General: Skin is warm and dry.   Neurological:     Mental Status: She is alert and oriented to person, place, and time.  Psychiatric:        Mood and Affect: Mood normal.    Lab Results Lab Results  Component Value Date   WBC 5.8 08/19/2021   HGB 12.8 08/19/2021   HCT 39.8 08/19/2021   MCV 88.8 08/19/2021   PLT 79 (L) 08/19/2021  Lab Results  Component Value Date   CREATININE 0.81 08/19/2021   BUN 15 08/19/2021   NA 134 (L) 08/19/2021   K 4.0 08/19/2021   CL 106 08/19/2021   CO2 21 (L) 08/19/2021    Lab Results  Component Value Date   ALT 20 08/19/2021   AST 19 08/19/2021   ALKPHOS 55 08/19/2021   BILITOT 0.3 08/19/2021     Microbiology: Recent Results (from the past 240 hour(s))  Blood culture (routine x 2)     Status: Abnormal (Preliminary result)   Collection Time: 08/16/21  7:33 PM   Specimen: Right Antecubital; Blood  Result Value Ref Range Status   Specimen Description   Final    RIGHT ANTECUBITAL BOTTLES DRAWN AEROBIC AND ANAEROBIC Performed at [facility], [address] [place], [place] [address]    Special Requests   Final    Blood Culture adequate volume Performed at [facility], [address] [place], [place] [address]    Culture  Setup Time   Final    GRAM NEGATIVE RODS Gram Stain Report Called to,Read Back By and Verified With: HENDERSON,I @ [id number] ON 08/17/21 BY JUW AEROBIC BOTTLE ONLY GS DONE @ [facility] CRITICAL RESULT CALLED TO, READ BACK BY AND VERIFIED WITH: PHARMD Hickox LEDFORD 08/18/21@00 :35 BY TW    Culture (A)  Final    KLEBSIELLA PNEUMONIAE CULTURE REINCUBATED FOR BETTER GROWTH Performed at [facility], [place] [address], [place], [place] [address]    Report Status PENDING  Incomplete  Blood Culture ID Panel (Reflexed)     Status: Abnormal   Collection Time: 08/16/21  7:33 PM  Result Value Ref Range Status   Enterococcus faecalis NOT DETECTED NOT DETECTED Final   Enterococcus Faecium NOT DETECTED NOT DETECTED Final   Listeria monocytogenes NOT DETECTED NOT  DETECTED Final   Staphylococcus species NOT DETECTED NOT DETECTED Final   Staphylococcus aureus (BCID) NOT DETECTED NOT DETECTED Final   Staphylococcus epidermidis NOT DETECTED NOT DETECTED Final   Staphylococcus lugdunensis NOT DETECTED NOT DETECTED Final   Streptococcus species NOT DETECTED NOT DETECTED Final   Streptococcus agalactiae NOT DETECTED NOT DETECTED Final   Streptococcus pneumoniae NOT DETECTED NOT DETECTED Final   Streptococcus pyogenes NOT DETECTED NOT DETECTED Final   A.calcoaceticus-baumannii NOT DETECTED NOT DETECTED Final   Bacteroides fragilis NOT DETECTED NOT DETECTED Final   Enterobacterales DETECTED (A) NOT DETECTED Final    Comment: Enterobacterales represent a large order of gram negative bacteria, not a single organism. CRITICAL RESULT CALLED TO, READ BACK BY AND VERIFIED WITH: PHARMD Pinera LEDFORD 08/18/21@00 :35 BY TW    Enterobacter cloacae complex NOT DETECTED NOT DETECTED Final   Escherichia coli NOT DETECTED NOT DETECTED Final   Klebsiella aerogenes NOT DETECTED NOT DETECTED Final   Klebsiella oxytoca NOT DETECTED NOT DETECTED Final   Klebsiella pneumoniae DETECTED (A) NOT DETECTED Final    Comment: CRITICAL RESULT CALLED TO, READ BACK BY AND VERIFIED WITH: PHARMD Shur LEDFORD 08/18/21@00 :35 BY TW    Proteus species NOT DETECTED NOT DETECTED Final   Salmonella species NOT DETECTED NOT DETECTED Final   Serratia marcescens NOT DETECTED NOT DETECTED Final   Haemophilus influenzae NOT DETECTED NOT DETECTED Final   Neisseria meningitidis NOT DETECTED NOT DETECTED Final   Pseudomonas aeruginosa NOT DETECTED NOT DETECTED Final   Stenotrophomonas maltophilia NOT DETECTED NOT DETECTED Final   Candida albicans NOT DETECTED NOT DETECTED Final   Candida auris NOT DETECTED NOT DETECTED Final   Candida glabrata NOT  DETECTED NOT DETECTED Final   Candida krusei NOT DETECTED NOT DETECTED Final   Candida parapsilosis NOT DETECTED NOT DETECTED Final   Candida  tropicalis NOT DETECTED NOT DETECTED Final   Cryptococcus neoformans/gattii NOT DETECTED NOT DETECTED Final   CTX-M ESBL NOT DETECTED NOT DETECTED Final   Carbapenem resistance IMP NOT DETECTED NOT DETECTED Final   Carbapenem resistance KPC NOT DETECTED NOT DETECTED Final   Carbapenem resistance NDM NOT DETECTED NOT DETECTED Final   Carbapenem resist OXA 48 LIKE NOT DETECTED NOT DETECTED Final   Carbapenem resistance VIM NOT DETECTED NOT DETECTED Final    Comment: Performed at [facility], [place] [address] [place], [place] [address]  Resp Panel by RT-PCR (Flu A&B, Covid) Nasopharyngeal Swab     Status: None   Collection Time: 08/16/21  7:53 PM   Specimen: Nasopharyngeal Swab; Nasopharyngeal(NP) swabs in vial transport medium  Result Value Ref Range Status   SARS Coronavirus 2 by RT PCR NEGATIVE NEGATIVE Final    Comment: (NOTE) SARS-CoV-2 target nucleic acids are NOT DETECTED.  The SARS-CoV-2 RNA is generally detectable in upper respiratory specimens during the acute phase of infection. The lowest concentration of SARS-CoV-2 viral copies this assay can detect is 138 copies/mL. A negative result does not preclude SARS-Cov-2 infection and should not be used as the sole basis for treatment or other patient management decisions. A negative result may occur with  improper specimen collection/handling, submission of specimen other than nasopharyngeal swab, presence of viral mutation(s) within the areas targeted by this assay, and inadequate number of viral copies(<138 copies/mL). A negative result must be combined with clinical observations, patient history, and epidemiological information. The expected result is Negative.  Fact Sheet for Patients:  EntrepreneurPulse.com.au  Fact Sheet for Healthcare Providers:  IncredibleEmployment.be  This test is no t yet approved or cleared by the [place] FDA and  has been authorized for detection  and/or diagnosis of SARS-CoV-2 by FDA under an Emergency Use Authorization (EUA). This EUA will remain  in effect (meaning this test can be used) for the duration of the COVID-19 declaration under Section 564(b)(1) of the Act, 21 U.S.C.section 360bbb-3(b)(1), unless the authorization is terminated  or revoked sooner.       Influenza A by PCR NEGATIVE NEGATIVE Final   Influenza B by PCR NEGATIVE NEGATIVE Final    Comment: (NOTE) The Xpert Xpress SARS-CoV-2/FLU/RSV plus assay is intended as an aid in the diagnosis of influenza from Nasopharyngeal swab specimens and should not be used as a sole basis for treatment. Nasal washings and aspirates are unacceptable for Xpert Xpress SARS-CoV-2/FLU/RSV testing.  Fact Sheet for Patients: EntrepreneurPulse.com.au  Fact Sheet for Healthcare Providers: IncredibleEmployment.be  This test is not yet approved or cleared by the [place] FDA and has been authorized for detection and/or diagnosis of SARS-CoV-2 by FDA under an Emergency Use Authorization (EUA). This EUA will remain in effect (meaning this test can be used) for the duration of the COVID-19 declaration under Section 564(b)(1) of the Act, 21 U.S.C. section 360bbb-3(b)(1), unless the authorization is terminated or revoked.  Performed at [facility], [address] [place], [place] [address]   Blood culture (routine x 2)     Status: None (Preliminary result)   Collection Time: 08/16/21 10:09 PM   Specimen: Left Antecubital; Blood  Result Value Ref Range Status   Specimen Description   Final    LEFT ANTECUBITAL BOTTLES DRAWN AEROBIC AND ANAEROBIC   Special Requests Blood Culture adequate  volume  Final   Culture   Final    NO GROWTH 3 DAYS Performed at [facility], [address], [place], [place] [address]    Report Status PENDING  Incomplete     Terri Piedra, [facility]  08/19/2021  11:44 AM

## 2021-08-19 NOTE — Progress Notes (Signed)
PROGRESS NOTE  Shannon [name] HDQ:[id number] DOB: May 10, 1966 DOA: 08/16/2021 PCP: Carolee Rota, NP   LOS: 1 day   Brief narrative:   Shannon [name] is a 55 y.o. female with medical history significant for CAD, GERD, hyperlipidemia, hypertension, asthma and anxiety presented to hospital with weakness fatigue back pain, generalized weakness low energy headache and multiple symptoms.  In the ED was bradycardic, blood pressure 106/68.  In the ED, patient had mild thrombocytopenia, hyponatremia hypokalemia and hyperglycemia.  Troponins were negative.  Lactate was negative.  COVID and flu was negative.  Cardiology was consulted and patient was admitted hospital for further evaluation and treatment.  Assessment/Plan:  Principal Problem:   Bacteremia due to Klebsiella pneumoniae Active Problems:   Coronary artery disease   Mixed hyperlipidemia   Essential hypertension   GERD (gastroesophageal reflux disease)   Atypical chest pain   Hypotension   Thrombocytopenia (HCC)   Hyperglycemia   Hyponatremia   Hypokalemia   Elevated troponin   Obesity (BMI 30-39.9)   Asthma   Anxiety   Chest pain  Atypical chest pain, elevated troponin likely demand ischemia. No current chest pain at this time.  Stress test done in 2022 showed possible apical ischemia.  Cardiology saw the patient but did not recommend further ischemic work-up at this time.  Klebsiella pneumonia bacteremia.  Blood blood culture from 08/18/2021, spoke with infectious disease regarding this finding.  CT scan of the abdomen pelvis showed possible liver abscess so MRI has been ordered.  Patient is on Rocephin IV at this time.  We will continue with that.  Hypotension Improved after IV hydration.  Hypokalemia Potassium 4.0.  Hyponatremia Sodium level of 134.  Received IV hydration.  Hyperglycemia Likely reactive.  Thrombocytopenia Latest platelet count of 79.  We will continue to monitor.  No evidence of  bleeding.  History of CAD Continue aspirin and statins.  Hyperlipidemia Continue Crestor  Essential hypertension Bisoprolol has been initiated.  GERD Continue Protonix  Anxiety Continue Xanax.  Asthma Continue bronchodilators.  Obesity Patient would benefit from weight loss as outpatient   DVT prophylaxis: SCDs Start: 08/17/21 0342   Code Status: Full code  Family Communication: None  Status is: Inpatient  Remains inpatient appropriate because: Need for MRI of the liver, bacteremia,   Consultants: Cardiology Infectious disease  Procedures: None  Anti-infectives:  Rocephin IV  Anti-infectives (From admission, onward)    Start     Dose/Rate Route Frequency Ordered Stop   08/18/21 0100  cefTRIAXone (ROCEPHIN) 2 g in sodium chloride 0.9 % 100 mL IVPB        2 g 200 mL/hr over 30 Minutes Intravenous Every 24 hours 08/18/21 0040         Subjective: Today, patient was seen and examined at bedside.  Complains of mild right upper quadrant tenderness.  Had some loose bowel movements but no obvious diarrhea.  Denies any chest pain today.  Objective: Vitals:   08/19/21 0514 08/19/21 0852  BP: 111/66 123/74  Pulse: 65 60  Resp: 19 20  Temp: 97.9 F (36.6 C) 97.9 F (36.6 C)  SpO2: 96%     Intake/Output Summary (Last 24 hours) at 08/19/2021 1405 Last data filed at 08/19/2021 0458 Gross per 24 hour  Intake 1598.28 ml  Output --  Net 1598.28 ml   Filed Weights   08/16/21 1845  Weight: 104.3 kg   Body mass index is 39.48 kg/m.   Physical Exam: GENERAL: Patient is alert awake and oriented. Not  in obvious distress.  Obese. HENT: No scleral pallor or icterus. Pupils equally reactive to light. Oral mucosa is moist NECK: is supple, no gross swelling noted. CHEST: Clear to auscultation. No crackles or wheezes.  Diminished breath sounds bilaterally. CVS: S1 and S2 heard, no murmur. Regular rate and rhythm.  ABDOMEN: Soft, right upper quadrant  tenderness on palpation., bowel sounds are present. EXTREMITIES: No edema. CNS: Cranial nerves are intact. No focal motor deficits. SKIN: warm and dry without rashes.  Data Review: I have personally reviewed the following laboratory data and studies,  CBC: Recent Labs  Lab 08/14/21 1311 08/16/21 2000 08/17/21 0551 08/18/21 0203 08/19/21 0128  WBC 7.9 8.3 7.9 6.1 5.8  NEUTROABS  --  7.4  --   --  3.1  HGB 16.2* 15.5* 13.7 11.9* 12.8  HCT 48.6* 46.9* 42.0 36.8 39.8  MCV 90.2 89.7 91.3 88.5 88.8  PLT 132* 73* 62* 60* 79*   Basic Metabolic Panel: Recent Labs  Lab 08/14/21 1311 08/16/21 2000 08/17/21 0551 08/18/21 0203 08/19/21 0128  NA 137 133* 134* 135 134*  K 4.9 3.4* 3.4* 3.6 4.0  CL 103 99 101 106 106  CO2 23 22 25 24  21*  GLUCOSE 86 160* 118* 112* 95  BUN 11 16 19 16 15   CREATININE 1.02* 1.17* 0.88 1.02* 0.81  CALCIUM 9.3 8.8* 8.4* 8.2* 8.4*  MG  --   --  2.1  --  2.1  PHOS  --   --  2.5  --   --    Liver Function Tests: Recent Labs  Lab 08/14/21 1455 08/16/21 2000 08/17/21 0551 08/18/21 0203 08/19/21 0128  AST 20 26 23 21 19   ALT 22 21 20 18 20   ALKPHOS 80 68 58 51 55  BILITOT 0.4 1.3* 0.8 0.4 0.3  PROT 7.5 7.7 7.0 5.9* 6.4*  ALBUMIN 4.1 3.8 3.4* 2.9* 3.1*   No results for input(s): LIPASE, AMYLASE in the last 168 hours. No results for input(s): AMMONIA in the last 168 hours. Cardiac Enzymes: No results for input(s): CKTOTAL, CKMB, CKMBINDEX, TROPONINI in the last 168 hours. BNP (last 3 results) No results for input(s): BNP in the last 8760 hours.  ProBNP (last 3 results) No results for input(s): PROBNP in the last 8760 hours.  CBG: No results for input(s): GLUCAP in the last 168 hours. Recent Results (from the past 240 hour(s))  Blood culture (routine x 2)     Status: Abnormal (Preliminary result)   Collection Time: 08/16/21  7:33 PM   Specimen: Right Antecubital; Blood  Result Value Ref Range Status   Specimen Description   Final    RIGHT  ANTECUBITAL BOTTLES DRAWN AEROBIC AND ANAEROBIC Performed at [facility], [address] [place], [place] [address]    Special Requests   Final    Blood Culture adequate volume Performed at [facility], [address] [place], [place] [address]    Culture  Setup Time   Final    GRAM NEGATIVE RODS Gram Stain Report Called to,Read Back By and Verified With: HENDERSON,I @ 0981 ON 08/17/21 BY JUW AEROBIC BOTTLE ONLY GS DONE @ [facility] CRITICAL RESULT CALLED TO, READ BACK BY AND VERIFIED WITH: PHARMD Rail LEDFORD 08/18/21@00 :35 BY TW    Culture (A)  Final    KLEBSIELLA PNEUMONIAE CULTURE REINCUBATED FOR BETTER GROWTH Performed at [facility], [place] [address] [place],  [address]    Report Status PENDING  Incomplete  Blood Culture ID Panel (Reflexed)  Status: Abnormal   Collection Time: 08/16/21  7:33 PM  Result Value Ref Range Status   Enterococcus faecalis NOT DETECTED NOT DETECTED Final   Enterococcus Faecium NOT DETECTED NOT DETECTED Final   Listeria monocytogenes NOT DETECTED NOT DETECTED Final   Staphylococcus species NOT DETECTED NOT DETECTED Final   Staphylococcus aureus (BCID) NOT DETECTED NOT DETECTED Final   Staphylococcus epidermidis NOT DETECTED NOT DETECTED Final   Staphylococcus lugdunensis NOT DETECTED NOT DETECTED Final   Streptococcus species NOT DETECTED NOT DETECTED Final   Streptococcus agalactiae NOT DETECTED NOT DETECTED Final   Streptococcus pneumoniae NOT DETECTED NOT DETECTED Final   Streptococcus pyogenes NOT DETECTED NOT DETECTED Final   A.calcoaceticus-baumannii NOT DETECTED NOT DETECTED Final   Bacteroides fragilis NOT DETECTED NOT DETECTED Final   Enterobacterales DETECTED (A) NOT DETECTED Final    Comment: Enterobacterales represent a large order of gram negative bacteria, not a single organism. CRITICAL RESULT CALLED TO, READ BACK BY AND VERIFIED WITH: PHARMD Zendejas LEDFORD 08/18/21@00 :35 BY TW    Enterobacter cloacae complex NOT  DETECTED NOT DETECTED Final   Escherichia coli NOT DETECTED NOT DETECTED Final   Klebsiella aerogenes NOT DETECTED NOT DETECTED Final   Klebsiella oxytoca NOT DETECTED NOT DETECTED Final   Klebsiella pneumoniae DETECTED (A) NOT DETECTED Final    Comment: CRITICAL RESULT CALLED TO, READ BACK BY AND VERIFIED WITH: PHARMD Wixon LEDFORD 08/18/21@00 :35 BY TW    Proteus species NOT DETECTED NOT DETECTED Final   Salmonella species NOT DETECTED NOT DETECTED Final   Serratia marcescens NOT DETECTED NOT DETECTED Final   Haemophilus influenzae NOT DETECTED NOT DETECTED Final   Neisseria meningitidis NOT DETECTED NOT DETECTED Final   Pseudomonas aeruginosa NOT DETECTED NOT DETECTED Final   Stenotrophomonas maltophilia NOT DETECTED NOT DETECTED Final   Candida albicans NOT DETECTED NOT DETECTED Final   Candida auris NOT DETECTED NOT DETECTED Final   Candida glabrata NOT DETECTED NOT DETECTED Final   Candida krusei NOT DETECTED NOT DETECTED Final   Candida parapsilosis NOT DETECTED NOT DETECTED Final   Candida tropicalis NOT DETECTED NOT DETECTED Final   Cryptococcus neoformans/gattii NOT DETECTED NOT DETECTED Final   CTX-M ESBL NOT DETECTED NOT DETECTED Final   Carbapenem resistance IMP NOT DETECTED NOT DETECTED Final   Carbapenem resistance KPC NOT DETECTED NOT DETECTED Final   Carbapenem resistance NDM NOT DETECTED NOT DETECTED Final   Carbapenem resist OXA 48 LIKE NOT DETECTED NOT DETECTED Final   Carbapenem resistance VIM NOT DETECTED NOT DETECTED Final    Comment: Performed at [facility], [address] [place], [place] [address]  Resp Panel by RT-PCR (Flu A&B, Covid) Nasopharyngeal Swab     Status: None   Collection Time: 08/16/21  7:53 PM   Specimen: Nasopharyngeal Swab; Nasopharyngeal(NP) swabs in vial transport medium  Result Value Ref Range Status   SARS Coronavirus 2 by RT PCR NEGATIVE NEGATIVE Final    Comment: (NOTE) SARS-CoV-2 target nucleic acids are NOT DETECTED.  The  SARS-CoV-2 RNA is generally detectable in upper respiratory specimens during the acute phase of infection. The lowest concentration of SARS-CoV-2 viral copies this assay can detect is 138 copies/mL. A negative result does not preclude SARS-Cov-2 infection and should not be used as the sole basis for treatment or other patient management decisions. A negative result may occur with  improper specimen collection/handling, submission of specimen other than nasopharyngeal swab, presence of viral mutation(s) within the areas targeted by this assay, and inadequate number of viral  copies(<138 copies/mL). A negative result must be combined with clinical observations, patient history, and epidemiological information. The expected result is Negative.  Fact Sheet for Patients:  EntrepreneurPulse.com.au  Fact Sheet for Healthcare Providers:  IncredibleEmployment.be  This test is no t yet approved or cleared by the [place] FDA and  has been authorized for detection and/or diagnosis of SARS-CoV-2 by FDA under an Emergency Use Authorization (EUA). This EUA will remain  in effect (meaning this test can be used) for the duration of the COVID-19 declaration under Section 564(b)(1) of the Act, 21 U.S.C.section 360bbb-3(b)(1), unless the authorization is terminated  or revoked sooner.       Influenza A by PCR NEGATIVE NEGATIVE Final   Influenza B by PCR NEGATIVE NEGATIVE Final    Comment: (NOTE) The Xpert Xpress SARS-CoV-2/FLU/RSV plus assay is intended as an aid in the diagnosis of influenza from Nasopharyngeal swab specimens and should not be used as a sole basis for treatment. Nasal washings and aspirates are unacceptable for Xpert Xpress SARS-CoV-2/FLU/RSV testing.  Fact Sheet for Patients: EntrepreneurPulse.com.au  Fact Sheet for Healthcare Providers: IncredibleEmployment.be  This test is not yet approved or  cleared by the [place] FDA and has been authorized for detection and/or diagnosis of SARS-CoV-2 by FDA under an Emergency Use Authorization (EUA). This EUA will remain in effect (meaning this test can be used) for the duration of the COVID-19 declaration under Section 564(b)(1) of the Act, 21 U.S.C. section 360bbb-3(b)(1), unless the authorization is terminated or revoked.  Performed at [facility], [address], [place], [place] [address]   Blood culture (routine x 2)     Status: None (Preliminary result)   Collection Time: 08/16/21 10:09 PM   Specimen: Left Antecubital; Blood  Result Value Ref Range Status   Specimen Description   Final    LEFT ANTECUBITAL BOTTLES DRAWN AEROBIC AND ANAEROBIC   Special Requests Blood Culture adequate volume  Final   Culture   Final    NO GROWTH 3 DAYS Performed at [facility], [address], [place], [place] [address]    Report Status PENDING  Incomplete     Studies: CT ABDOMEN PELVIS W CONTRAST  Result Date: 08/18/2021 CLINICAL DATA:  Fever and chills with Klebsiella positive blood cultures. EXAM: CT ABDOMEN AND PELVIS WITH CONTRAST TECHNIQUE: Multidetector CT imaging of the abdomen and pelvis was performed using the standard protocol following bolus administration of intravenous contrast. CONTRAST:  138mL OMNIPAQUE 300 COMPARISON:  CT of the chest from 08/14/2021 FINDINGS: Lower chest: Lung bases are free of acute infiltrate or sizable effusion. Hepatobiliary: A previously seen hypodensity within the right lobe of the liver has increased in size now measuring almost 2 cm with some peripheral enhancement identified suspicious for small hepatic abscess. Second hypodensity is noted along the medial aspect of the right lobe of the liver best seen on image number 27 of series 3. The hypodense region measures approximately 10 mm in dimension. Gallbladder is within normal limits. Pancreas: Unremarkable. No pancreatic ductal dilatation or surrounding  inflammatory changes. Spleen: Normal in size without focal abnormality. Adrenals/Urinary Tract: Adrenal glands are within normal limits. Kidneys demonstrate a normal enhancement pattern bilaterally. No renal calculi or obstructive changes are noted. The bladder is partially distended. Stomach/Bowel: Colon shows scattered contrast material throughout colon. The appendix is within normal limits. Small bowel and stomach appear within normal limits. Vascular/Lymphatic: Aortic atherosclerosis. No enlarged abdominal or pelvic lymph nodes. Reproductive: Uterus and bilateral adnexa are unremarkable. Other: No abdominal  wall hernia or abnormality. No abdominopelvic ascites. Musculoskeletal: Right hip replacement is noted. Degenerative changes of left hip are noted. Degenerative changes of lumbar spine are seen. IMPRESSION: Previously seen hypodensity within the right lobe of the liver which has been stable for over 1 year now demonstrates some slight increase in size with peripheral enhancement suspicious for hepatic abscess. This would correspond with the given clinical history. MRI with contrast is recommended for further evaluation. No other focal abnormality is noted. Electronically Signed   By: Inez Catalina M.D.   On: 08/18/2021 20:53   ECHOCARDIOGRAM COMPLETE  Result Date: 08/18/2021    ECHOCARDIOGRAM REPORT   Patient Name:   [name] Date of Exam: 08/18/2021 Medical Rec #:  [id number]     Height:       64.0 in Accession #:    [id number]    Weight:       230.0 lb Date of Birth:  December 07, 1965     BSA:          2.076 m Patient Age:    42 years      BP:           136/76 mmHg Patient Gender: F             HR:           57 bpm. Exam Location:  Inpatient Procedure: 2D Echo, Color Doppler, Cardiac Doppler and Intracardiac            Opacification Agent Indications:    R07.9* Chest pain, unspecified  History:        Patient has prior history of Echocardiogram examinations, most                 recent 03/11/2018. CAD;  Risk Factors:Hypertension, Dyslipidemia                 and Sleep Apnea.  Sonographer:    Raquel Sarna Senior RDCS Referring Phys: [place]  Sonographer Comments: Technically difficult due to poor echo windows. IMPRESSIONS  1. Left ventricular ejection fraction, by estimation, is 60 to 65%. The left ventricle has normal function. The left ventricle has no regional wall motion abnormalities. Left ventricular diastolic parameters were normal.  2. Right ventricular systolic function is normal. The right ventricular size is normal.  3. The mitral valve is normal in structure. No evidence of mitral valve regurgitation. No evidence of mitral stenosis.  4. The aortic valve was not well visualized. Aortic valve regurgitation is not visualized. No aortic stenosis is present.  5. The inferior vena cava is normal in size with greater than 50% respiratory variability, suggesting right atrial pressure of 3 mmHg. FINDINGS  Left Ventricle: Left ventricular ejection fraction, by estimation, is 60 to 65%. The left ventricle has normal function. The left ventricle has no regional wall motion abnormalities. Definity contrast agent was given IV to delineate the left ventricular  endocardial borders. The left ventricular internal cavity size was normal in size. There is no left ventricular hypertrophy. Left ventricular diastolic parameters were normal. Right Ventricle: The right ventricular size is normal. Right vetricular wall thickness was not well visualized. Right ventricular systolic function is normal. Left Atrium: Left atrial size was normal in size. Right Atrium: Right atrial size was normal in size. Pericardium: There is no evidence of pericardial effusion. Mitral Valve: The mitral valve is normal in structure. No evidence of mitral valve regurgitation. No evidence of mitral valve stenosis. Tricuspid Valve: The tricuspid valve is  normal in structure. Tricuspid valve regurgitation is trivial. No evidence of tricuspid  stenosis. Aortic Valve: The aortic valve was not well visualized. Aortic valve regurgitation is not visualized. No aortic stenosis is present. Pulmonic Valve: The pulmonic valve was not well visualized. Pulmonic valve regurgitation is not visualized. No evidence of pulmonic stenosis. Aorta: The aortic root is normal in size and structure. Venous: The inferior vena cava is normal in size with greater than 50% respiratory variability, suggesting right atrial pressure of 3 mmHg. IAS/Shunts: The interatrial septum was not well visualized.  LEFT VENTRICLE PLAX 2D LVIDd:         3.60 cm   Diastology LVIDs:         2.60 cm   LV e' medial:    7.83 cm/s LV PW:         0.90 cm   LV E/e' medial:  12.6 LV IVS:        1.20 cm   LV e' lateral:   13.10 cm/s LVOT diam:     1.90 cm   LV E/e' lateral: 7.5 LV SV:         66 LV SV Index:   32 LVOT Area:     2.84 cm  RIGHT VENTRICLE RV S prime:     9.68 cm/s TAPSE (M-mode): 2.3 cm LEFT ATRIUM             Index        RIGHT ATRIUM           Index LA diam:        3.30 cm 1.59 cm/m   RA Area:     14.70 cm LA Vol (A2C):   58.3 ml 28.08 ml/m  RA Volume:   32.70 ml  15.75 ml/m LA Vol (A4C):   42.5 ml 20.47 ml/m LA Biplane Vol: 49.6 ml 23.89 ml/m  AORTIC VALVE LVOT Vmax:   93.40 cm/s LVOT Vmean:  70.100 cm/s LVOT VTI:    0.233 m  AORTA Ao Root diam: 3.00 cm MITRAL VALVE MV Area (PHT): 2.70 cm    SHUNTS MV Decel Time: 281 msec    Systemic VTI:  0.23 m MV E velocity: 98.50 cm/s  Systemic Diam: 1.90 cm MV A velocity: 74.90 cm/s MV E/A ratio:  1.32 Kirk Ruths MD Electronically signed by Kirk Ruths MD Signature Date/Time: 08/18/2021/3:37:16 PM    Final       Flora Lipps, MD  [facility] 08/19/2021  If 7PM-7AM, please contact night-coverage

## 2021-08-20 ENCOUNTER — Inpatient Hospital Stay: Payer: Self-pay

## 2021-08-20 ENCOUNTER — Inpatient Hospital Stay (HOSPITAL_COMMUNITY): Payer: Medicare HMO

## 2021-08-20 LAB — CULTURE, BLOOD (ROUTINE X 2): Special Requests: ADEQUATE

## 2021-08-20 IMAGING — MR MR ABDOMEN WO/W CM
18 of 22 series · 44 of 48 positions shown · IV contrast (gadavist)
Comparison: CT of the abdomen and pelvis [DATE]. No prior
abdominal MRI.

CLINICAL DATA: 55-year-old female with history of fever and chills.
Klebsiella positive blood cultures. Weakness and fatigue. Evaluate
for possible hepatic abscess. Abnormal CT scan.

EXAM:
MRI ABDOMEN WITHOUT AND WITH CONTRAST
TECHNIQUE: Multiplanar multisequence MR imaging of the abdomen was performed
both before and after the administration of intravenous contrast.
CONTRAST:  9mL GADAVIST GADOBUTROL 1 MMOL/ML IV SOLN

[Series 4: cor haste · coronal · 6.0mm · 1.47mm/px · 1 of 45 slices shown]
[im 1/45]
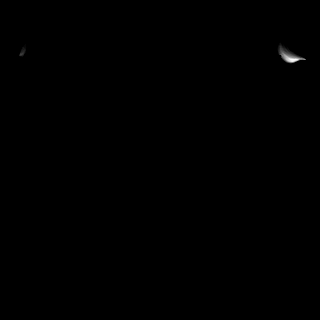

[Series 5: ax haste · axial · 6.0mm · 1.47mm/px · 1 of 42 slices shown]
[im 1/42]
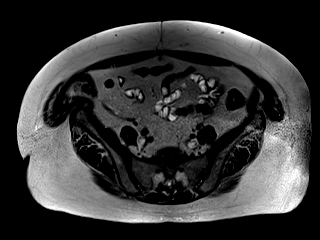

[Series 8: T2 fat-sat · axial · 6.0mm · 1.47mm/px · 1 of 44 slices shown]
[im 1/44]
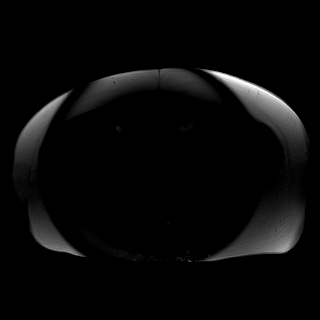

[Series 9: t1_vibe_opp-in_tra_p4_bh · axial · 3.0mm · 1.47mm/px · z∈[-166,+95]mm · 2 of 88 slices shown (1 of 2)]
[im 1/88]
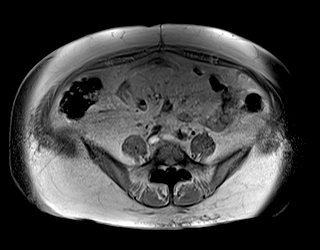
[im 88/88]
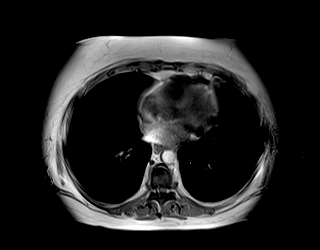

[Series 9: t1_vibe_opp-in_tra_p4_bh · axial · 3.0mm · 1.47mm/px · z∈[-166,+95]mm · 3 of 88 slices shown (2 of 2)]
[im 1/88]
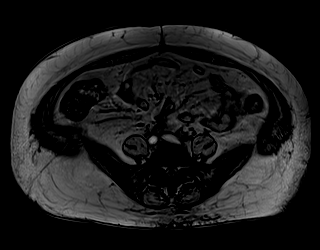
[im 44/88]
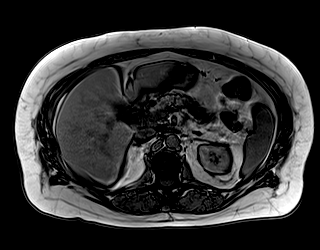
[im 88/88]
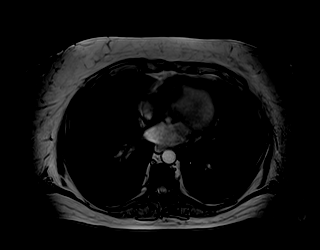

[Series 10: DWI · axial · 6.0mm · 1.79mm/px · z∈[-193,+117]mm · 4 of 132 slices shown (1 of 2)]
[im 1/132]
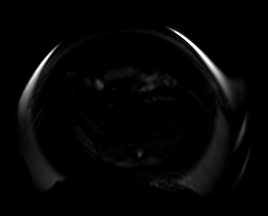
[im 44/132]
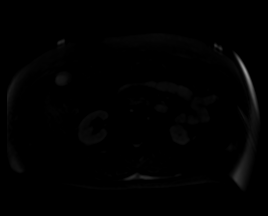
[im 88/132]
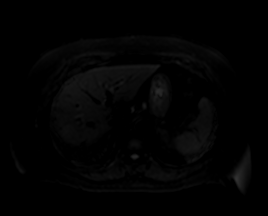
[im 132/132]
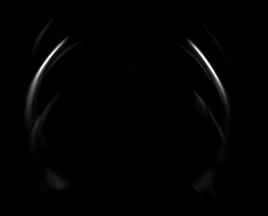

[Series 11: DWI · axial · 6.0mm · 1.79mm/px · 1 of 44 slices shown (2 of 2)]
[im 1/44]
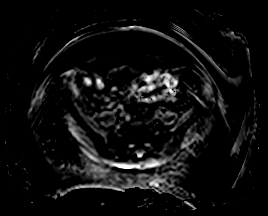

[Series 12: bSSFP · axial · 6.0mm · 0.94mm/px · 1 of 40 slices shown]
[im 1/40]
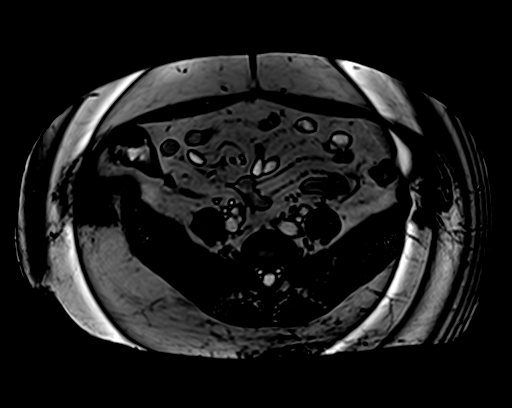

[Series 13: t1_vibe_fs_tra_p4_bh_pre · axial · 3.0mm · 1.50mm/px · z∈[-169,+92]mm · 3 of 88 slices shown]
[im 1/88]
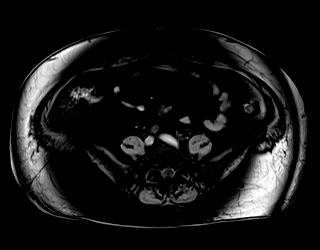
[im 44/88]
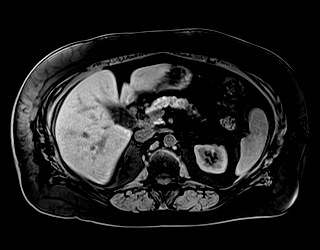
[im 88/88]
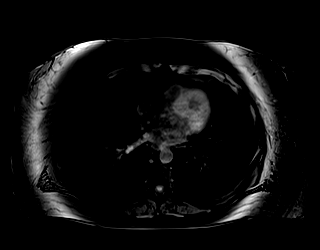

[Series 15: t1_vibe_fs_tra_p4_bh_post · axial · 3.0mm · 1.50mm/px · z∈[-169,+92]mm · 3 of 88 slices shown (1 of 4)]
[im 1/88]
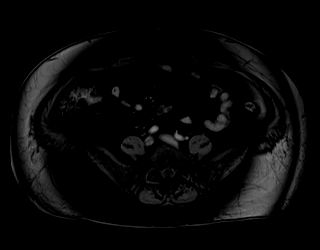
[im 44/88]
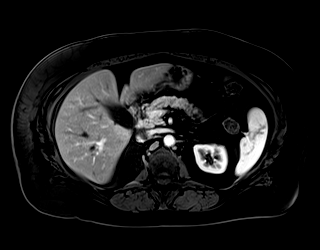
[im 88/88]
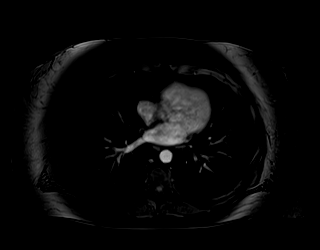

[Series 16: t1_vibe_fs_tra_p4_bh_post_sub · axial · 3.0mm · 1.50mm/px · z∈[-169,+92]mm · 3 of 88 slices shown (1 of 4)]
[im 1/88]
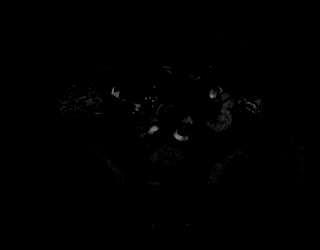
[im 44/88]
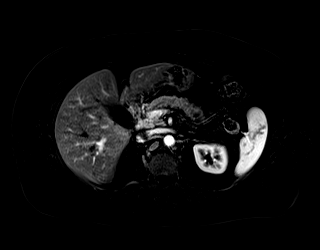
[im 88/88]
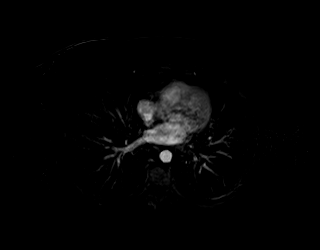

[Series 17: t1_vibe_fs_tra_p4_bh_post · axial · 3.0mm · 1.50mm/px · z∈[-169,+92]mm · 3 of 88 slices shown (2 of 4)]
[im 1/88]
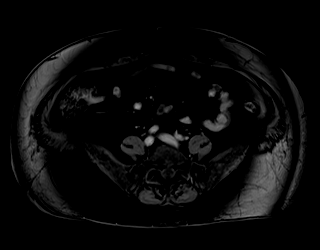
[im 44/88]
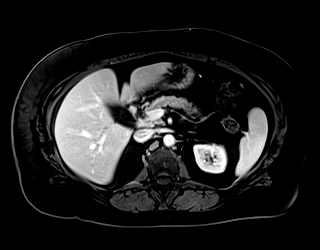
[im 88/88]
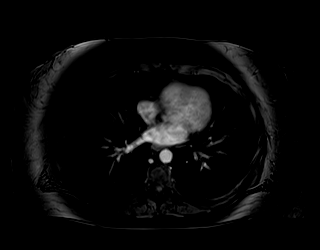

[Series 18: t1_vibe_fs_tra_p4_bh_post_sub · axial · 3.0mm · 1.50mm/px · z∈[-169,+92]mm · 3 of 88 slices shown (2 of 4)]
[im 1/88]
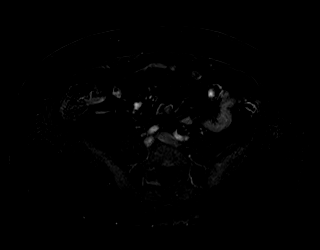
[im 44/88]
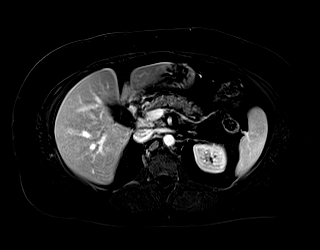
[im 88/88]
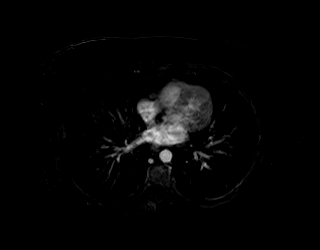

[Series 19: t1_vibe_fs_tra_p4_bh_post · axial · 3.0mm · 1.50mm/px · z∈[-169,+92]mm · 3 of 88 slices shown (3 of 4)]
[im 1/88]
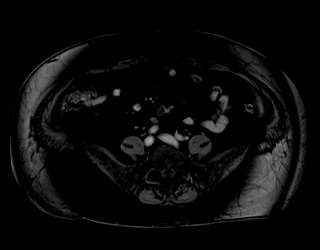
[im 44/88]
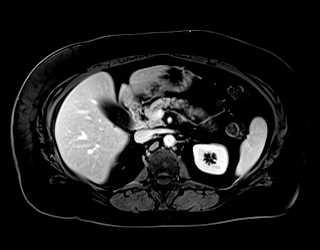
[im 88/88]
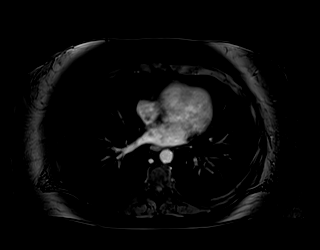

[Series 20: t1_vibe_fs_tra_p4_bh_post_sub · axial · 3.0mm · 1.50mm/px · z∈[-169,+92]mm · 3 of 88 slices shown (3 of 4)]
[im 1/88]
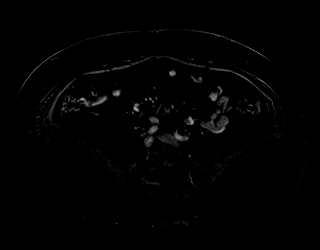
[im 44/88]
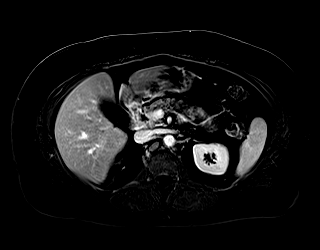
[im 88/88]
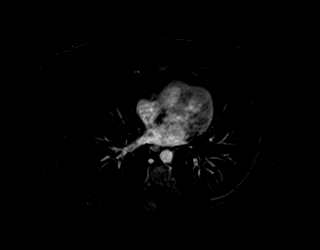

[Series 21: t1_vibe_fs_tra_p4_bh_post · axial · 3.0mm · 1.50mm/px · z∈[-169,+92]mm · 3 of 88 slices shown (4 of 4)]
[im 1/88]
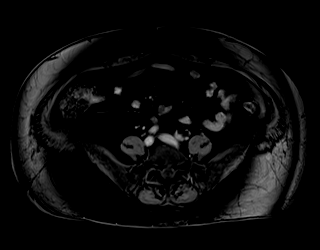
[im 44/88]
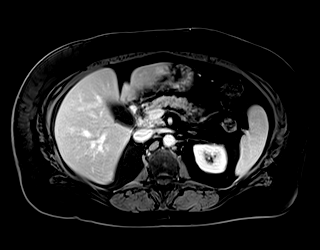
[im 88/88]
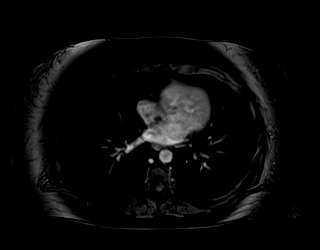

[Series 22: t1_vibe_fs_tra_p4_bh_post_sub · axial · 3.0mm · 1.50mm/px · z∈[-169,+92]mm · 3 of 88 slices shown (4 of 4)]
[im 1/88]
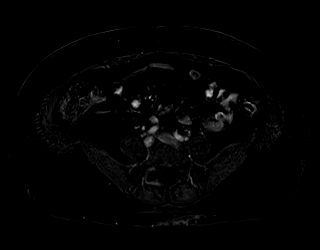
[im 44/88]
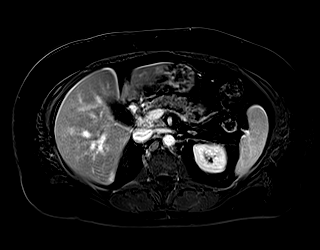
[im 88/88]
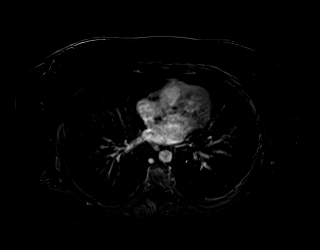

[Series 23: T1 dynamic post-contrast · coronal · 3.0mm · 1.50mm/px · 3 of 104 slices shown]
[im 1/104]
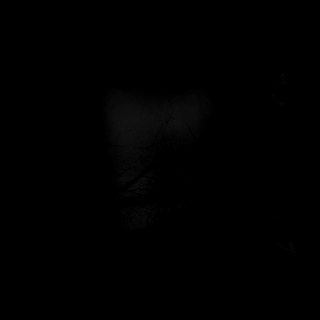
[im 52/104]
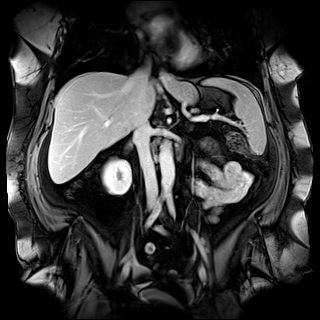
[im 104/104]
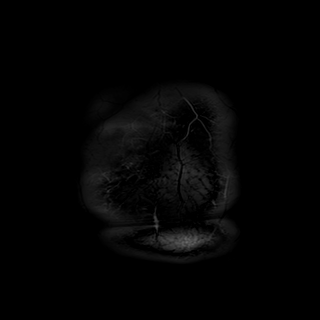

[44 of 48 positions shown; findings below may reference images not displayed]

FINDINGS: Lower chest: Unremarkable.

Hepatobiliary: In the right lobe of the liver there are 3 small
lesions which are T1 hypointense, T2 hyperintense and demonstrate
some associated post gadolinium enhancement, largest of which is
between segments 7 and 8 (axial image 20 of series 19) measuring
x 2.2 cm. These lesions demonstrate surrounding increased T2 signal
intensity indicative of edema as well as a large degree of
perilesional hyperenhancement during arterial phase and portal
venous phase imaging, which normalizes on delayed imaging, such that
delayed contrast enhancement is limited to the outer margin of the
lesion. No intra or extrahepatic biliary ductal dilatation.
Gallbladder is normal in appearance.

Pancreas: No pancreatic mass. No pancreatic ductal dilatation. No
pancreatic or peripancreatic fluid collections or inflammatory
changes.

Spleen:  Unremarkable.

Adrenals/Urinary Tract: Bilateral kidneys and bilateral adrenal
glands are normal in appearance. No hydroureteronephrosis in the
visualized portions of the abdomen. Bilateral adrenal glands are
normal in appearance.

Stomach/Bowel: Visualized portions are unremarkable.

Vascular/Lymphatic: No aneurysm identified in the visualized
abdominal vasculature. No lymphadenopathy noted in the abdomen.

Other: No significant volume of ascites noted in the visualized
portions of the peritoneal cavity.

Musculoskeletal: No aggressive appearing osseous lesions are noted
in the visualized portions of the skeleton.
IMPRESSION: 1. Three hepatic lesions in the right lobe of the liver, with
imaging characteristics most concerning for small hepatic abscesses,
as detailed above.

## 2021-08-20 MED ORDER — SODIUM CHLORIDE 0.9% FLUSH: 10.0000 mL | Freq: Two times a day (BID) | INTRAVENOUS | Status: DC

## 2021-08-20 MED ORDER — METRONIDAZOLE 500 MG PO TABS: 500.0000 mg | ORAL_TABLET | Freq: Two times a day (BID) | ORAL | 0 refills | Status: DC

## 2021-08-20 MED ORDER — METRONIDAZOLE 500 MG PO TABS
500.0000 mg | ORAL_TABLET | Freq: Two times a day (BID) | ORAL | Status: DC
  Administered 2021-08-20: 500 mg via ORAL
  Filled 2021-08-20: qty 1

## 2021-08-20 MED ORDER — GADOBUTROL 1 MMOL/ML IV SOLN
9.0000 mL | Freq: Once | INTRAVENOUS | Status: AC | PRN
  Administered 2021-08-20: 9 mL via INTRAVENOUS

## 2021-08-20 MED ORDER — CEFTRIAXONE IV (FOR PTA / DISCHARGE USE ONLY): 2.0000 g | INTRAVENOUS | 0 refills | Status: DC

## 2021-08-20 MED ORDER — SODIUM CHLORIDE 0.9% FLUSH: 10.0000 mL | Freq: Two times a day (BID) | INTRAVENOUS | 0 refills | Status: DC

## 2021-08-20 MED ORDER — OXYCODONE HCL 5 MG PO TABS: 5.0000 mg | ORAL_TABLET | Freq: Four times a day (QID) | ORAL | 0 refills | Status: DC | PRN

## 2021-08-20 MED ORDER — HEPARIN SOD (PORK) LOCK FLUSH 100 UNIT/ML IV SOLN
250.0000 [IU] | INTRAVENOUS | Status: AC | PRN
  Administered 2021-08-20: 250 [IU]
  Filled 2021-08-20: qty 2.5

## 2021-08-20 MED ORDER — NICOTINE 21 MG/24HR TD PT24: 21.0000 mg | MEDICATED_PATCH | Freq: Every day | TRANSDERMAL | 0 refills | Status: DC

## 2021-08-20 MED ORDER — CHLORHEXIDINE GLUCONATE CLOTH 2 % EX PADS
6.0000 | MEDICATED_PAD | Freq: Every day | CUTANEOUS | Status: DC
  Administered 2021-08-20: 6 via TOPICAL

## 2021-08-20 MED ORDER — SODIUM CHLORIDE 0.9% FLUSH: 10.0000 mL | INTRAVENOUS | Status: DC | PRN

## 2021-08-20 NOTE — Discharge Summary (Signed)
Physician Discharge Summary  [name]:[id number] DOB: 28-Sep-1966 DOA: 08/16/2021  PCP: Mitzi Hansen, NP  Admit date: 08/16/2021 Discharge date: 08/20/2021  Admitted From: Home  Discharge disposition: Home with home health  Recommendations for Outpatient Follow-Up:   Follow up with your primary care provider in one week.  Check CBC, BMP, magnesium in the next visit Follow-up with infectious disease clinic as has been scheduled for antibiotic management. Will continue IV antibiotic until 09/14/2021 followed by infectious disease reassessment.  Discharge Diagnosis:   Principal Problem:   Bacteremia due to Klebsiella pneumoniae Active Problems:   Coronary artery disease   Mixed hyperlipidemia   Essential hypertension   GERD (gastroesophageal reflux disease)   Atypical chest pain   Hypotension   Thrombocytopenia (HCC)   Hyperglycemia   Hyponatremia   Hypokalemia   Elevated troponin   Obesity (BMI 30-39.9)   Asthma   Anxiety   Chest pain Liver abscess.  Discharge Condition: Improved.  Diet recommendation: Low sodium, heart healthy.    Wound care: None.  Code status: Full.   History of Present Illness:    Shannon [name] is a 55 y.o. female with medical history significant for CAD, GERD, hyperlipidemia, hypertension, asthma and anxiety presented to hospital with weakness fatigue back pain, generalized weakness low energy headache and multiple symptoms.  In the ED,  patient was bradycardic, blood pressure 106/68, had mild thrombocytopenia, hyponatremia, hypokalemia and hyperglycemia.  Troponins were negative.  Lactate was negative.  COVID and flu was negative.  Cardiology was consulted and the patient was admitted hospital for further evaluation and treatment.  Hospital Course:   Following conditions were addressed during hospitalization as listed below,  Atypical chest pain, elevated troponin likely demand ischemia.  Stress test done in 2022 showed  possible apical ischemia.  Cardiology saw the patient but did not recommend further ischemic work-up at this time.  Conservative treatment was planned.   Klebsiella pneumoniae bacteremia.  Blood blood culture from 08/18/2021.  CT scan of the abdomen pelvis showed possible liver abscess so MRI of the liver was obtained which showed 3 hepatic lesions suggestive of liver abscess.  Patient has been seen by infectious disease who recommended PICC line placement and IV antibiotic as outpatient for next 4 weeks until 09/14/2021 followed by reassessment.  Patient will be discharged home on IV Rocephin and metronidazole.  Home health has been ordered.   Hypotension Improved after IV hydration.  Hypokalemia Improved after replacement.  Latest potassium of 4.0.  Hyponatremia Sodium level of 134.  Received IV hydration.  Hyperglycemia Likely reactive.  Thrombocytopenia Latest platelet count of 79.    History of CAD Continue aspirin and statins.  Hyperlipidemia Continue Crestor  Essential hypertension Continue bisoprolol at discharge.  GERD Continue Protonix  Anxiety Continue Xanax.  Asthma Continue bronchodilators on discharge.  Obesity Patient would benefit from weight loss as outpatient   Disposition.  At this time, patient is stable for disposition home with outpatient PCP and ID follow-up.  Medical Consultants:   Infectious disease  Procedures:    PICC line placement on 08/20/2021 Subjective:   Today, patient was seen and examined at bedside.  Continues to feel well.  No headache, fever chills nausea or vomiting.  Discharge Exam:   Vitals:   08/20/21 0420 08/20/21 1339  BP: (!) 151/85 103/67  Pulse: 64 60  Resp: 17 18  Temp: 98.1 F (36.7 C) 98.3 F (36.8 C)  SpO2: 97%    Vitals:   08/19/21 1450  08/19/21 2042 08/20/21 0420 08/20/21 1339  BP: 131/72 138/85 (!) 151/85 103/67  Pulse: (!) 57 67 64 60  Resp: $Remo'18 17 17 18  'SycXa$ Temp: 98.6 F (37 C) 98.2 F (36.8  C) 98.1 F (36.7 C) 98.3 F (36.8 C)  TempSrc: Oral Oral Oral Oral  SpO2:  100% 97%   Weight:      Height:       General: Alert awake, not in obvious distress HENT: pupils equally reacting to light,  No scleral pallor or icterus noted. Oral mucosa is moist.  Chest:  Clear breath sounds.  Diminished breath sounds bilaterally. No crackles or wheezes.  CVS: S1 &S2 heard. No murmur.  Regular rate and rhythm. Abdomen: Soft, mild tenderness over the right upper quadrant on deep palpation.  Nondistended.  Bowel sounds are heard.   Extremities: No cyanosis, clubbing or edema.  Peripheral pulses are palpable. Psych: Alert, awake and oriented, normal mood CNS:  No cranial nerve deficits.  Power equal in all extremities.   Skin: Warm and dry.  No rashes noted.  The results of significant diagnostics from this hospitalization (including imaging, microbiology, ancillary and laboratory) are listed below for reference.     Diagnostic Studies:   No results found.   Labs:   Basic Metabolic Panel: Recent Labs  Lab 08/14/21 1311 08/16/21 2000 08/17/21 0551 08/18/21 0203 08/19/21 0128  NA 137 133* 134* 135 134*  K 4.9 3.4* 3.4* 3.6 4.0  CL 103 99 101 106 106  CO2 $Re'23 22 25 24 'viu$ 21*  GLUCOSE 86 160* 118* 112* 95  BUN $Re'11 16 19 16 15  'wKS$ CREATININE 1.02* 1.17* 0.88 1.02* 0.81  CALCIUM 9.3 8.8* 8.4* 8.2* 8.4*  MG  --   --  2.1  --  2.1  PHOS  --   --  2.5  --   --    GFR Estimated Creatinine Clearance: 90.1 mL/min (by C-G formula based on SCr of 0.81 mg/dL). Liver Function Tests: Recent Labs  Lab 08/14/21 1455 08/16/21 2000 08/17/21 0551 08/18/21 0203 08/19/21 0128  AST $Re'20 26 23 21 19  'IIB$ ALT $R'22 21 20 18 20  'eg$ ALKPHOS 80 68 58 51 55  BILITOT 0.4 1.3* 0.8 0.4 0.3  PROT 7.5 7.7 7.0 5.9* 6.4*  ALBUMIN 4.1 3.8 3.4* 2.9* 3.1*   No results for input(s): LIPASE, AMYLASE in the last 168 hours. No results for input(s): AMMONIA in the last 168 hours. Coagulation profile Recent Labs  Lab  08/17/21 0551  INR 1.2    CBC: Recent Labs  Lab 08/14/21 1311 08/16/21 2000 08/17/21 0551 08/18/21 0203 08/19/21 0128  WBC 7.9 8.3 7.9 6.1 5.8  NEUTROABS  --  7.4  --   --  3.1  HGB 16.2* 15.5* 13.7 11.9* 12.8  HCT 48.6* 46.9* 42.0 36.8 39.8  MCV 90.2 89.7 91.3 88.5 88.8  PLT 132* 73* 62* 60* 79*   Cardiac Enzymes: No results for input(s): CKTOTAL, CKMB, CKMBINDEX, TROPONINI in the last 168 hours. BNP: Invalid input(s): POCBNP CBG: No results for input(s): GLUCAP in the last 168 hours. D-Dimer No results for input(s): DDIMER in the last 72 hours. Hgb A1c No results for input(s): HGBA1C in the last 72 hours. Lipid Profile No results for input(s): CHOL, HDL, LDLCALC, TRIG, CHOLHDL, LDLDIRECT in the last 72 hours. Thyroid function studies No results for input(s): TSH, T4TOTAL, T3FREE, THYROIDAB in the last 72 hours.  Invalid input(s): FREET3 Anemia work up [employer]    08/18/21 0203  [id number]   Microbiology Recent Results (from the past 240 hour(s))  Blood culture (routine x 2)     Status: Abnormal   Collection Time: 08/16/21  7:33 PM   Specimen: Right Antecubital; Blood  Result Value Ref Range Status   Specimen Description   Final    RIGHT ANTECUBITAL BOTTLES DRAWN AEROBIC AND ANAEROBIC Performed at [facility], [address] [place], [place] [address]    Special Requests   Final    Blood Culture adequate volume Performed at [facility], [address], [place], [place] [address]    Culture  Setup Time   Final    GRAM NEGATIVE RODS Gram Stain Report Called to,Read Back By and Verified With: HENDERSON,I @ 6770 ON 08/17/21 BY JUW AEROBIC BOTTLE ONLY GS DONE @ [facility] CRITICAL RESULT CALLED TO, READ BACK BY AND VERIFIED WITH: PHARMD Saab LEDFORD 08/18/21@00 :35 BY TW Performed at [facility], [place] [address], [place], [place] [address]    Culture KLEBSIELLA PNEUMONIAE (A)  Final   Report Status 08/20/2021 FINAL  Final   Organism ID, Bacteria  KLEBSIELLA PNEUMONIAE  Final      Susceptibility   Klebsiella pneumoniae - MIC*    AMPICILLIN >=32 RESISTANT Resistant     CEFAZOLIN <=4 SENSITIVE Sensitive     CEFEPIME <=0.12 SENSITIVE Sensitive     CEFTAZIDIME <=1 SENSITIVE Sensitive     CEFTRIAXONE <=0.25 SENSITIVE Sensitive     CIPROFLOXACIN <=0.25 SENSITIVE Sensitive     GENTAMICIN <=1 SENSITIVE Sensitive     IMIPENEM 0.5 SENSITIVE Sensitive     TRIMETH/SULFA <=20 SENSITIVE Sensitive     AMPICILLIN/SULBACTAM 8 SENSITIVE Sensitive     PIP/TAZO <=4 SENSITIVE Sensitive     * KLEBSIELLA PNEUMONIAE  Blood Culture ID Panel (Reflexed)     Status: Abnormal   Collection Time: 08/16/21  7:33 PM  Result Value Ref Range Status   Enterococcus faecalis NOT DETECTED NOT DETECTED Final   Enterococcus Faecium NOT DETECTED NOT DETECTED Final   Listeria monocytogenes NOT DETECTED NOT DETECTED Final   Staphylococcus species NOT DETECTED NOT DETECTED Final   Staphylococcus aureus (BCID) NOT DETECTED NOT DETECTED Final   Staphylococcus epidermidis NOT DETECTED NOT DETECTED Final   Staphylococcus lugdunensis NOT DETECTED NOT DETECTED Final   Streptococcus species NOT DETECTED NOT DETECTED Final   Streptococcus agalactiae NOT DETECTED NOT DETECTED Final   Streptococcus pneumoniae NOT DETECTED NOT DETECTED Final   Streptococcus pyogenes NOT DETECTED NOT DETECTED Final   A.calcoaceticus-baumannii NOT DETECTED NOT DETECTED Final   Bacteroides fragilis NOT DETECTED NOT DETECTED Final   Enterobacterales DETECTED (A) NOT DETECTED Final    Comment: Enterobacterales represent a large order of gram negative bacteria, not a single organism. CRITICAL RESULT CALLED TO, READ BACK BY AND VERIFIED WITH: PHARMD Rauen LEDFORD 08/18/21@00 :35 BY TW    Enterobacter cloacae complex NOT DETECTED NOT DETECTED Final   Escherichia coli NOT DETECTED NOT DETECTED Final   Klebsiella aerogenes NOT DETECTED NOT DETECTED Final   Klebsiella oxytoca NOT DETECTED NOT DETECTED  Final   Klebsiella pneumoniae DETECTED (A) NOT DETECTED Final    Comment: CRITICAL RESULT CALLED TO, READ BACK BY AND VERIFIED WITH: PHARMD Sabey LEDFORD 08/18/21@00 :35 BY TW    Proteus species NOT DETECTED NOT DETECTED Final   Salmonella species NOT DETECTED NOT DETECTED Final   Serratia marcescens NOT DETECTED NOT DETECTED Final   Haemophilus influenzae NOT DETECTED NOT DETECTED Final   Neisseria meningitidis NOT DETECTED NOT DETECTED Final  Pseudomonas aeruginosa NOT DETECTED NOT DETECTED Final   Stenotrophomonas maltophilia NOT DETECTED NOT DETECTED Final   Candida albicans NOT DETECTED NOT DETECTED Final   Candida auris NOT DETECTED NOT DETECTED Final   Candida glabrata NOT DETECTED NOT DETECTED Final   Candida krusei NOT DETECTED NOT DETECTED Final   Candida parapsilosis NOT DETECTED NOT DETECTED Final   Candida tropicalis NOT DETECTED NOT DETECTED Final   Cryptococcus neoformans/gattii NOT DETECTED NOT DETECTED Final   CTX-M ESBL NOT DETECTED NOT DETECTED Final   Carbapenem resistance IMP NOT DETECTED NOT DETECTED Final   Carbapenem resistance KPC NOT DETECTED NOT DETECTED Final   Carbapenem resistance NDM NOT DETECTED NOT DETECTED Final   Carbapenem resist OXA 48 LIKE NOT DETECTED NOT DETECTED Final   Carbapenem resistance VIM NOT DETECTED NOT DETECTED Final    Comment: Performed at [facility], [address], [place], [place] [address]  Resp Panel by RT-PCR (Flu A&B, Covid) Nasopharyngeal Swab     Status: None   Collection Time: 08/16/21  7:53 PM   Specimen: Nasopharyngeal Swab; Nasopharyngeal(NP) swabs in vial transport medium  Result Value Ref Range Status   SARS Coronavirus 2 by RT PCR NEGATIVE NEGATIVE Final    Comment: (NOTE) SARS-CoV-2 target nucleic acids are NOT DETECTED.  The SARS-CoV-2 RNA is generally detectable in upper respiratory specimens during the acute phase of infection. The lowest concentration of SARS-CoV-2 viral copies this assay can detect  is 138 copies/mL. A negative result does not preclude SARS-Cov-2 infection and should not be used as the sole basis for treatment or other patient management decisions. A negative result may occur with  improper specimen collection/handling, submission of specimen other than nasopharyngeal swab, presence of viral mutation(s) within the areas targeted by this assay, and inadequate number of viral copies(<138 copies/mL). A negative result must be combined with clinical observations, patient history, and epidemiological information. The expected result is Negative.  Fact Sheet for Patients:  EntrepreneurPulse.com.au  Fact Sheet for Healthcare Providers:  IncredibleEmployment.be  This test is no t yet approved or cleared by the [place] FDA and  has been authorized for detection and/or diagnosis of SARS-CoV-2 by FDA under an Emergency Use Authorization (EUA). This EUA will remain  in effect (meaning this test can be used) for the duration of the COVID-19 declaration under Section 564(b)(1) of the Act, 21 U.S.C.section 360bbb-3(b)(1), unless the authorization is terminated  or revoked sooner.       Influenza A by PCR NEGATIVE NEGATIVE Final   Influenza B by PCR NEGATIVE NEGATIVE Final    Comment: (NOTE) The Xpert Xpress SARS-CoV-2/FLU/RSV plus assay is intended as an aid in the diagnosis of influenza from Nasopharyngeal swab specimens and should not be used as a sole basis for treatment. Nasal washings and aspirates are unacceptable for Xpert Xpress SARS-CoV-2/FLU/RSV testing.  Fact Sheet for Patients: EntrepreneurPulse.com.au  Fact Sheet for Healthcare Providers: IncredibleEmployment.be  This test is not yet approved or cleared by the [place] FDA and has been authorized for detection and/or diagnosis of SARS-CoV-2 by FDA under an Emergency Use Authorization (EUA). This EUA will remain in effect  (meaning this test can be used) for the duration of the COVID-19 declaration under Section 564(b)(1) of the Act, 21 U.S.C. section 360bbb-3(b)(1), unless the authorization is terminated or revoked.  Performed at [facility], [address], [place], [place] [address]   Blood culture (routine x 2)     Status: None (Preliminary result)   Collection Time:  08/16/21 10:09 PM   Specimen: Left Antecubital; Blood  Result Value Ref Range Status   Specimen Description   Final    LEFT ANTECUBITAL BOTTLES DRAWN AEROBIC AND ANAEROBIC   Special Requests Blood Culture adequate volume  Final   Culture   Final    NO GROWTH 4 DAYS Performed at [facility], [address], [place], [place] [address]    Report Status PENDING  Incomplete     Discharge Instructions:   Discharge Instructions     [facility] pharmacist to adjust dose for Vancomycin, Aminoglycosides and other anti-infective therapies as requested by physician.   Complete by: As directed    [facility] to provide Cath Flo 2mg    Complete by: As directed    Administer for PICC line occlusion and as ordered by physician for other access device issues.   Anaphylaxis Kit: Provided to treat any anaphylactic reaction to the medication being provided to the patient if First Dose or when requested by physician   Complete by: As directed    Epinephrine 1mg /ml vial / amp: Administer 0.3mg  (0.7ml) subcutaneously once for moderate to severe anaphylaxis, nurse to call physician and pharmacy when reaction occurs and call 911 if needed for immediate care   Diphenhydramine 50mg /ml IV vial: Administer 25-50mg  IV/IM PRN for first dose reaction, rash, itching, mild reaction, nurse to call physician and pharmacy when reaction occurs   Sodium Chloride 0.9% NS 559ml IV: Administer if needed for hypovolemic blood pressure drop or as ordered by physician after call to physician with anaphylactic reaction   Change dressing on IV access line  weekly and PRN   Complete by: As directed    Diet - low sodium heart healthy   Complete by: As directed    Discharge instructions   Complete by: As directed    Follow-up with your primary care physician in 1 week.  Check blood work at that time.  Follow-up with infectious disease as has been scheduled. Continue antibiotics at home.  Seek medical attention for worsening symptoms   Flush IV access with Sodium Chloride 0.9% and Heparin 10 units/ml or 100 units/ml   Complete by: As directed    Home infusion instructions - [facility]   Complete by: As directed    Instructions: Flush IV access with Sodium Chloride 0.9% and Heparin 10units/ml or 100units/ml   Change dressing on IV access line: Weekly and PRN   Instructions Cath Flo 2mg : Administer for PICC Line occlusion and as ordered by physician for other access device   [facility] pharmacist to adjust dose for: Vancomycin, Aminoglycosides and other anti-infective therapies as requested by physician   Increase activity slowly   Complete by: As directed    Method of administration may be changed at the discretion of home infusion pharmacist based upon assessment of the patient and/or caregiver's ability to self-administer the medication ordered   Complete by: As directed    No wound care   Complete by: As directed       Allergies as of 08/20/2021       Reactions   Clomiphene Citrate Nausea And Vomiting   Morphine And Related Other (See Comments)   PATIENT REFUSES THIS MEDICATION: states that she does not tolerate this medication well   Sulfa Antibiotics Other (See Comments)   Not sure ? rash   Clomiphene Rash        Medication List     STOP taking these medications  azithromycin 250 MG tablet Commonly known as: ZITHROMAX   benzonatate 100 MG capsule Commonly known as: TESSALON   HYDROmorphone 2 MG tablet Commonly known as: DILAUDID   lidocaine 5 % Commonly known as: Lidoderm   nitroGLYCERIN  0.4 MG SL tablet Commonly known as: NITROSTAT       TAKE these medications    albuterol 108 (90 Base) MCG/ACT inhaler Commonly known as: VENTOLIN HFA Inhale 2 puffs into the lungs every 4 (four) hours as needed for wheezing or shortness of breath.   ALPRAZolam 1 MG tablet Commonly known as: XANAX Take 1 mg by mouth 2 (two) times daily as needed for anxiety.   aspirin EC 81 MG tablet Take 81 mg by mouth daily. Swallow whole.   bisoprolol-hydrochlorothiazide 5-6.25 MG tablet Commonly known as: ZIAC Take 1 tablet by mouth daily. Pt takes in the pm   cefTRIAXone  IVPB Commonly known as: ROCEPHIN Inject 2 g into the vein daily for 25 days. Indication:  Kleb PNA bacteremia + liver abscess First Dose: Yes Last Day of Therapy:  09/14/21 Labs - Once weekly:  CBC/D and BMP, Labs - Every other week:  ESR and CRP Method of administration: IV Push Method of administration may be changed at the discretion of home infusion pharmacist based upon assessment of the patient and/or caregiver's ability to self-administer the medication ordered.   escitalopram 20 MG tablet Commonly known as: LEXAPRO Take 20 mg by mouth daily. Takes in the pm   fluticasone 50 MCG/ACT nasal spray Commonly known as: FLONASE Place 2 sprays into both nostrils at bedtime.   metroNIDAZOLE 500 MG tablet Commonly known as: Flagyl Take 1 tablet (500 mg total) by mouth 2 (two) times daily for 25 days.   nicotine 21 mg/24hr patch Commonly known as: NICODERM CQ - dosed in mg/24 hours Place 1 patch (21 mg total) onto the skin daily. Start taking on: August 21, 2021   oxyCODONE 5 MG immediate release tablet Commonly known as: Oxy IR/ROXICODONE Take 1 tablet (5 mg total) by mouth every 6 (six) hours as needed for severe pain or moderate pain. What changed:  medication strength how much to take when to take this reasons to take this   pantoprazole 40 MG tablet Commonly known as: PROTONIX Take 40 mg by mouth  daily.   rosuvastatin 20 MG tablet Commonly known as: CRESTOR Take 1 tablet (20 mg total) by mouth at bedtime.   triamcinolone cream 0.1 % Commonly known as: KENALOG Apply 1 application topically 2 (two) times daily. What changed:  when to take this reasons to take this               Discharge Care Instructions  (From admission, onward)           Start     Ordered   08/20/21 0000  Change dressing on IV access line weekly and PRN  (Home infusion instructions - [facility] )        08/20/21 1034            Follow-up Information     [facility] Follow up.   Specialty: Cardiology Why: [place] location - office will call you to arrange follow-up. Contact information: [address]        Carolee Rota, NP. Schedule an appointment as soon as possible for a visit in 1 week(s).   Specialty: Nurse Practitioner Contact information: [place]  [place] [address] [phone]         Truman Hayward, MD Follow up on 09/09/2021.   Specialty: Infectious Diseases Why: at 11 AM Contact information: [address] [address] [phone]                 Time coordinating discharge: 39 minutes  Signed:  Leilani Cespedes  [facility] 08/20/2021, 2:13 PM

## 2021-08-20 NOTE — TOC Initial Note (Signed)
Transition of Care [facility]) - Initial/Assessment Note    Patient Details  Name: Shannon [name] MRN: [id number] Date of Birth: [birth year]  Transition of [facility]) CM/SW Contact:    Bethena Roys, RN Phone Number: 08/20/2021, 3:58 PM  Clinical Narrative: Case Manager spoke with patient regarding IV antibiotics for home. Infectious Disease had reached out to [employer] and Liaison has completed the education for the patient. [facility] is willing to accept the patient for RN services; however, initial visit will be on Saturday 08-24-21. MD and patient are aware of start of care services. Patient states now she will be staying with her mother at [place] is aware.         Expected Discharge Plan: [place] Barriers to Discharge: No Barriers Identified   Patient Goals and CMS Choice Patient states their goals for this hospitalization and ongoing recovery are:: to return home with home health services.   Choice offered to / list presented to :  (Patient did not have any preference- calling [facility])  Expected Discharge Plan and Services Expected Discharge Plan: [place] In-house Referral: NA Discharge Planning Services: CM Consult Post Acute Care Choice: Home Health   Expected Discharge Date: 08/20/21                 DME Agency: NA       HH Arranged: RN, Disease Management [place] Agency: [place] Date [facility] Agency Contacted: 08/20/21 Time HH Agency Contacted: 1000 Representative spoke with at [place]: Tommi Rumps  Prior Living Arrangements/Services   Lives with:: Self Patient language and need for interpreter reviewed:: Yes        Need for Family Participation in Patient Care: Yes (Comment) Care giver support system in place?: Yes (comment) Current home services: DME, Home RN Criminal Activity/Legal Involvement Pertinent to Current Situation/Hospitalization: No - Comment as  needed  Activities of Daily Living      Permission Sought/Granted Permission sought to share information with : Family Supports, Customer service manager, Case Optician, dispensing granted to share information with : Yes, Verbal Permission Granted     Permission granted to share info w AGENCY: [facility]        Emotional Assessment Appearance:: Appears stated age Attitude/Demeanor/Rapport: Engaged Affect (typically observed): Appropriate Orientation: : Oriented to Self, Oriented to Place, Oriented to  Time, Oriented to Situation Alcohol / Substance Use: Not Applicable Psych Involvement: No (comment)  Admission diagnosis:  Chest pain [R07.9] Other fatigue [R53.83] Patient Active Problem List   Diagnosis Date Noted   Bacteremia due to Klebsiella pneumoniae 08/18/2021   Chest pain 08/18/2021   Atypical chest pain 08/17/2021   Hypotension 08/17/2021   Thrombocytopenia ([place]) 08/17/2021   Hyperglycemia 08/17/2021   Hyponatremia 08/17/2021   Hypokalemia 08/17/2021   Elevated troponin 08/17/2021   Obesity (BMI 30-39.9) 08/17/2021   Asthma 08/17/2021   Anxiety 08/17/2021   Primary osteoarthritis of right hip 01/16/2021   Chronic diastolic HF (heart failure) ([place]) 08/10/2018   GERD (gastroesophageal reflux disease) 08/09/2018   Unstable angina ([place]) 08/09/2018   Carotid artery disease ([place]) 07/24/2017   AAA (abdominal aortic aneurysm) without rupture 01/05/2013   Other malaise and fatigue 01/05/2013   Palpitations 01/05/2013   Tobacco use disorder 12/02/2012   Coronary artery disease    Mixed hyperlipidemia    Essential hypertension    SHORTNESS OF BREATH 09/04/2009   CHEST PAIN 09/04/2009   PCP:  Carolee Rota, NP  Pharmacy:   [id number] [address] [address], [place] [address] [place] [address] Phone: [phone] Fax: [phone]   Readmission Risk Interventions No flowsheet data found.

## 2021-08-20 NOTE — Progress Notes (Signed)
PHARMACY CONSULT NOTE FOR:  OUTPATIENT  PARENTERAL ANTIBIOTIC THERAPY (OPAT)  Indication: Klebsiella PNA Bacteremia + liver abscess Regimen: Rocephin 2g IV every 24 hours + Flagyl 500 mg po bid End date: 09/14/21  IV antibiotic discharge orders are pended. To discharging provider:  please sign these orders via discharge navigator,  Select New Orders & click on the button choice - Manage This Unsigned Work.     Thank you for allowing pharmacy to be a part of this patient's care.  Alycia Rossetti, PharmD, BCPS Infectious Diseases Clinical Pharmacist 08/20/2021 9:42 AM   **Pharmacist phone directory can now be found on [email or web address] ([email or web address]).  Listed under [place].

## 2021-08-20 NOTE — Progress Notes (Signed)
Subjective: Patient is eager to go home today.   Antibiotics:  Anti-infectives (From admission, onward)    Start     Dose/Rate Route Frequency Ordered Stop   08/20/21 1045  metroNIDAZOLE (FLAGYL) tablet 500 mg        500 mg Oral Every 12 hours 08/20/21 0947     08/18/21 0100  cefTRIAXone (ROCEPHIN) 2 g in sodium chloride 0.9 % 100 mL IVPB        2 g 200 mL/hr over 30 Minutes Intravenous Every 24 hours 08/18/21 0040         Medications: Scheduled Meds:  aspirin EC  81 mg Oral Daily   bisoprolol  2.5 mg Oral Daily   escitalopram  20 mg Oral Q2000   fluticasone  2 spray Each Nare QHS   metroNIDAZOLE  500 mg Oral Q12H   nicotine  21 mg Transdermal Daily   pantoprazole  40 mg Oral Daily   rosuvastatin  20 mg Oral QHS   Continuous Infusions:  sodium chloride 125 mL/hr at 08/20/21 0651   sodium chloride Stopped (08/20/21 0402)   cefTRIAXone (ROCEPHIN)  IV 2 g (08/20/21 0216)   PRN Meds:.sodium chloride, acetaminophen, albuterol, ALPRAZolam, oxyCODONE, triamcinolone cream    Objective: Weight change:   Intake/Output Summary (Last 24 hours) at 08/20/2021 0954 Last data filed at 08/20/2021 0651 Gross per 24 hour  Intake 3006.61 ml  Output --  Net 3006.61 ml   Blood pressure (!) 151/85, pulse 64, temperature 98.1 F (36.7 C), temperature source Oral, resp. rate 17, height 5\' 4"  (1.626 m), weight 104.3 kg, SpO2 97 %. Temp:  [98.1 F (36.7 C)-98.6 F (37 C)] 98.1 F (36.7 C) (11/22 0420) Pulse Rate:  [57-67] 64 (11/22 0420) Resp:  [17-18] 17 (11/22 0420) BP: (131-151)/(72-85) 151/85 (11/22 0420) SpO2:  [97 %-100 %] 97 % (11/22 0420)  Physical Exam: Physical Exam Constitutional:      General: She is not in acute distress.    Appearance: She is well-developed. She is not diaphoretic.  HENT:     Head: Normocephalic and atraumatic.     Right Ear: External ear normal.     Left Ear: External ear normal.     Mouth/Throat:     Pharynx: No oropharyngeal  exudate.  Eyes:     General: No scleral icterus.    Conjunctiva/sclera: Conjunctivae normal.     Pupils: Pupils are equal, round, and reactive to light.  Cardiovascular:     Rate and Rhythm: Normal rate and regular rhythm.  Pulmonary:     Effort: Pulmonary effort is normal. No respiratory distress.     Breath sounds: No wheezing.  Abdominal:     General: There is no distension.     Palpations: Abdomen is soft. There is no mass.     Tenderness: There is no abdominal tenderness.     Hernia: No hernia is present.  Musculoskeletal:        General: No tenderness. Normal range of motion.  Lymphadenopathy:     Cervical: No cervical adenopathy.  Skin:    General: Skin is warm and dry.     Coloration: Skin is not pale.     Findings: No erythema or rash.  Neurological:     General: No focal deficit present.     Mental Status: She is alert and oriented to person, place, and time.     Motor: No abnormal muscle tone.     Coordination: Coordination normal.  Psychiatric:        Mood and Affect: Mood normal.        Behavior: Behavior normal.        Thought Content: Thought content normal.        Judgment: Judgment normal.     CBC:    BMET Recent Labs    08/18/21 0203 08/19/21 0128  NA 135 134*  K 3.6 4.0  CL 106 106  CO2 24 21*  GLUCOSE 112* 95  BUN 16 15  CREATININE 1.02* 0.81  CALCIUM 8.2* 8.4*     Liver Panel  Recent Labs    08/18/21 0203 08/19/21 0128  PROT 5.9* 6.4*  ALBUMIN 2.9* 3.1*  AST 21 19  ALT 18 20  ALKPHOS 51 55  BILITOT 0.4 0.3       Sedimentation Rate No results for input(s): ESRSEDRATE in the last 72 hours. C-Reactive Protein No results for input(s): CRP in the last 72 hours.  Micro Results: Recent Results (from the past 720 hour(s))  Blood culture (routine x 2)     Status: Abnormal   Collection Time: 08/16/21  7:33 PM   Specimen: Right Antecubital; Blood  Result Value Ref Range Status   Specimen Description   Final    RIGHT  ANTECUBITAL BOTTLES DRAWN AEROBIC AND ANAEROBIC Performed at [facility], [address] [place], [place] [address]    Special Requests   Final    Blood Culture adequate volume Performed at [facility], [address], [place], [place] [address]    Culture  Setup Time   Final    GRAM NEGATIVE RODS Gram Stain Report Called to,Read Back By and Verified With: HENDERSON,I @ 0981 ON 08/17/21 BY JUW AEROBIC BOTTLE ONLY GS DONE @ [facility] CRITICAL RESULT CALLED TO, READ BACK BY AND VERIFIED WITH: PHARMD Huckeby LEDFORD 08/18/21@00 :35 BY TW Performed at [facility], [place] [address] [place], [place] [address]    Culture KLEBSIELLA PNEUMONIAE (A)  Final   Report Status 08/20/2021 FINAL  Final   Organism ID, Bacteria KLEBSIELLA PNEUMONIAE  Final      Susceptibility   Klebsiella pneumoniae - MIC*    AMPICILLIN >=32 RESISTANT Resistant     CEFAZOLIN <=4 SENSITIVE Sensitive     CEFEPIME <=0.12 SENSITIVE Sensitive     CEFTAZIDIME <=1 SENSITIVE Sensitive     CEFTRIAXONE <=0.25 SENSITIVE Sensitive     CIPROFLOXACIN <=0.25 SENSITIVE Sensitive     GENTAMICIN <=1 SENSITIVE Sensitive     IMIPENEM 0.5 SENSITIVE Sensitive     TRIMETH/SULFA <=20 SENSITIVE Sensitive     AMPICILLIN/SULBACTAM 8 SENSITIVE Sensitive     PIP/TAZO <=4 SENSITIVE Sensitive     * KLEBSIELLA PNEUMONIAE  Blood Culture ID Panel (Reflexed)     Status: Abnormal   Collection Time: 08/16/21  7:33 PM  Result Value Ref Range Status   Enterococcus faecalis NOT DETECTED NOT DETECTED Final   Enterococcus Faecium NOT DETECTED NOT DETECTED Final   Listeria monocytogenes NOT DETECTED NOT DETECTED Final   Staphylococcus species NOT DETECTED NOT DETECTED Final   Staphylococcus aureus (BCID) NOT DETECTED NOT DETECTED Final   Staphylococcus epidermidis NOT DETECTED NOT DETECTED Final   Staphylococcus lugdunensis NOT DETECTED NOT DETECTED Final   Streptococcus species NOT DETECTED NOT DETECTED Final   Streptococcus agalactiae NOT DETECTED  NOT DETECTED Final   Streptococcus pneumoniae NOT DETECTED NOT DETECTED Final   Streptococcus pyogenes NOT DETECTED NOT DETECTED Final   A.calcoaceticus-baumannii NOT DETECTED NOT DETECTED Final  Bacteroides fragilis NOT DETECTED NOT DETECTED Final   Enterobacterales DETECTED (A) NOT DETECTED Final    Comment: Enterobacterales represent a large order of gram negative bacteria, not a single organism. CRITICAL RESULT CALLED TO, READ BACK BY AND VERIFIED WITH: PHARMD Joyner LEDFORD 08/18/21@00 :35 BY TW    Enterobacter cloacae complex NOT DETECTED NOT DETECTED Final   Escherichia coli NOT DETECTED NOT DETECTED Final   Klebsiella aerogenes NOT DETECTED NOT DETECTED Final   Klebsiella oxytoca NOT DETECTED NOT DETECTED Final   Klebsiella pneumoniae DETECTED (A) NOT DETECTED Final    Comment: CRITICAL RESULT CALLED TO, READ BACK BY AND VERIFIED WITH: PHARMD Weltman LEDFORD 08/18/21@00 :35 BY TW    Proteus species NOT DETECTED NOT DETECTED Final   Salmonella species NOT DETECTED NOT DETECTED Final   Serratia marcescens NOT DETECTED NOT DETECTED Final   Haemophilus influenzae NOT DETECTED NOT DETECTED Final   Neisseria meningitidis NOT DETECTED NOT DETECTED Final   Pseudomonas aeruginosa NOT DETECTED NOT DETECTED Final   Stenotrophomonas maltophilia NOT DETECTED NOT DETECTED Final   Candida albicans NOT DETECTED NOT DETECTED Final   Candida auris NOT DETECTED NOT DETECTED Final   Candida glabrata NOT DETECTED NOT DETECTED Final   Candida krusei NOT DETECTED NOT DETECTED Final   Candida parapsilosis NOT DETECTED NOT DETECTED Final   Candida tropicalis NOT DETECTED NOT DETECTED Final   Cryptococcus neoformans/gattii NOT DETECTED NOT DETECTED Final   CTX-M ESBL NOT DETECTED NOT DETECTED Final   Carbapenem resistance IMP NOT DETECTED NOT DETECTED Final   Carbapenem resistance KPC NOT DETECTED NOT DETECTED Final   Carbapenem resistance NDM NOT DETECTED NOT DETECTED Final   Carbapenem resist OXA 48  LIKE NOT DETECTED NOT DETECTED Final   Carbapenem resistance VIM NOT DETECTED NOT DETECTED Final    Comment: Performed at [facility], [address] [place], [place] [address]  Resp Panel by RT-PCR (Flu A&B, Covid) Nasopharyngeal Swab     Status: None   Collection Time: 08/16/21  7:53 PM   Specimen: Nasopharyngeal Swab; Nasopharyngeal(NP) swabs in vial transport medium  Result Value Ref Range Status   SARS Coronavirus 2 by RT PCR NEGATIVE NEGATIVE Final    Comment: (NOTE) SARS-CoV-2 target nucleic acids are NOT DETECTED.  The SARS-CoV-2 RNA is generally detectable in upper respiratory specimens during the acute phase of infection. The lowest concentration of SARS-CoV-2 viral copies this assay can detect is 138 copies/mL. A negative result does not preclude SARS-Cov-2 infection and should not be used as the sole basis for treatment or other patient management decisions. A negative result may occur with  improper specimen collection/handling, submission of specimen other than nasopharyngeal swab, presence of viral mutation(s) within the areas targeted by this assay, and inadequate number of viral copies(<138 copies/mL). A negative result must be combined with clinical observations, patient history, and epidemiological information. The expected result is Negative.  Fact Sheet for Patients:  EntrepreneurPulse.com.au  Fact Sheet for Healthcare Providers:  IncredibleEmployment.be  This test is no t yet approved or cleared by the [place] FDA and  has been authorized for detection and/or diagnosis of SARS-CoV-2 by FDA under an Emergency Use Authorization (EUA). This EUA will remain  in effect (meaning this test can be used) for the duration of the COVID-19 declaration under Section 564(b)(1) of the Act, 21 U.S.C.section 360bbb-3(b)(1), unless the authorization is terminated  or revoked sooner.       Influenza A by PCR NEGATIVE NEGATIVE  Final   Influenza B by  PCR NEGATIVE NEGATIVE Final    Comment: (NOTE) The Xpert Xpress SARS-CoV-2/FLU/RSV plus assay is intended as an aid in the diagnosis of influenza from Nasopharyngeal swab specimens and should not be used as a sole basis for treatment. Nasal washings and aspirates are unacceptable for Xpert Xpress SARS-CoV-2/FLU/RSV testing.  Fact Sheet for Patients: EntrepreneurPulse.com.au  Fact Sheet for Healthcare Providers: IncredibleEmployment.be  This test is not yet approved or cleared by the [place] FDA and has been authorized for detection and/or diagnosis of SARS-CoV-2 by FDA under an Emergency Use Authorization (EUA). This EUA will remain in effect (meaning this test can be used) for the duration of the COVID-19 declaration under Section 564(b)(1) of the Act, 21 U.S.C. section 360bbb-3(b)(1), unless the authorization is terminated or revoked.  Performed at [facility], [address], [place], [place] [address]   Blood culture (routine x 2)     Status: None (Preliminary result)   Collection Time: 08/16/21 10:09 PM   Specimen: Left Antecubital; Blood  Result Value Ref Range Status   Specimen Description   Final    LEFT ANTECUBITAL BOTTLES DRAWN AEROBIC AND ANAEROBIC   Special Requests Blood Culture adequate volume  Final   Culture   Final    NO GROWTH 4 DAYS Performed at [facility], [address], [place], [place] [address]    Report Status PENDING  Incomplete    Studies/Results: MR ABDOMEN W WO CONTRAST  Result Date: 08/20/2021 CLINICAL DATA:  55 year old female with history of fever and chills. Klebsiella positive blood cultures. Weakness and fatigue. Evaluate for possible hepatic abscess. Abnormal CT scan. EXAM: MRI ABDOMEN WITHOUT AND WITH CONTRAST TECHNIQUE: Multiplanar multisequence MR imaging of the abdomen was performed both before and after the administration of intravenous contrast. CONTRAST:  60mL GADAVIST  GADOBUTROL 1 MMOL/ML IV SOLN COMPARISON:  CT of the abdomen and pelvis 08/18/2021. No prior abdominal MRI. FINDINGS: Lower chest: Unremarkable. Hepatobiliary: In the right lobe of the liver there are 3 small lesions which are T1 hypointense, T2 hyperintense and demonstrate some associated post gadolinium enhancement, largest of which is between segments 7 and 8 (axial image 20 of series 19) measuring 2.2 x 2.2 cm. These lesions demonstrate surrounding increased T2 signal intensity indicative of edema as well as a large degree of perilesional hyperenhancement during arterial phase and portal venous phase imaging, which normalizes on delayed imaging, such that delayed contrast enhancement is limited to the outer margin of the lesion. No intra or extrahepatic biliary ductal dilatation. Gallbladder is normal in appearance. Pancreas: No pancreatic mass. No pancreatic ductal dilatation. No pancreatic or peripancreatic fluid collections or inflammatory changes. Spleen:  Unremarkable. Adrenals/Urinary Tract: Bilateral kidneys and bilateral adrenal glands are normal in appearance. No hydroureteronephrosis in the visualized portions of the abdomen. Bilateral adrenal glands are normal in appearance. Stomach/Bowel: Visualized portions are unremarkable. Vascular/Lymphatic: No aneurysm identified in the visualized abdominal vasculature. No lymphadenopathy noted in the abdomen. Other: No significant volume of ascites noted in the visualized portions of the peritoneal cavity. Musculoskeletal: No aggressive appearing osseous lesions are noted in the visualized portions of the skeleton. IMPRESSION: 1. Three hepatic lesions in the right lobe of the liver, with imaging characteristics most concerning for small hepatic abscesses, as detailed above. Electronically Signed   By: Vinnie Langton M.D.   On: 08/20/2021 08:34   CT ABDOMEN PELVIS W CONTRAST  Result Date: 08/18/2021 CLINICAL DATA:  Fever and chills with Klebsiella positive  blood cultures. EXAM: CT ABDOMEN AND PELVIS WITH CONTRAST  TECHNIQUE: Multidetector CT imaging of the abdomen and pelvis was performed using the standard protocol following bolus administration of intravenous contrast. CONTRAST:  134mL OMNIPAQUE 300 COMPARISON:  CT of the chest from 08/14/2021 FINDINGS: Lower chest: Lung bases are free of acute infiltrate or sizable effusion. Hepatobiliary: A previously seen hypodensity within the right lobe of the liver has increased in size now measuring almost 2 cm with some peripheral enhancement identified suspicious for small hepatic abscess. Second hypodensity is noted along the medial aspect of the right lobe of the liver best seen on image number 27 of series 3. The hypodense region measures approximately 10 mm in dimension. Gallbladder is within normal limits. Pancreas: Unremarkable. No pancreatic ductal dilatation or surrounding inflammatory changes. Spleen: Normal in size without focal abnormality. Adrenals/Urinary Tract: Adrenal glands are within normal limits. Kidneys demonstrate a normal enhancement pattern bilaterally. No renal calculi or obstructive changes are noted. The bladder is partially distended. Stomach/Bowel: Colon shows scattered contrast material throughout colon. The appendix is within normal limits. Small bowel and stomach appear within normal limits. Vascular/Lymphatic: Aortic atherosclerosis. No enlarged abdominal or pelvic lymph nodes. Reproductive: Uterus and bilateral adnexa are unremarkable. Other: No abdominal wall hernia or abnormality. No abdominopelvic ascites. Musculoskeletal: Right hip replacement is noted. Degenerative changes of left hip are noted. Degenerative changes of lumbar spine are seen. IMPRESSION: Previously seen hypodensity within the right lobe of the liver which has been stable for over 1 year now demonstrates some slight increase in size with peripheral enhancement suspicious for hepatic abscess. This would correspond with the  given clinical history. MRI with contrast is recommended for further evaluation. No other focal abnormality is noted. Electronically Signed   By: Inez Catalina M.D.   On: 08/18/2021 20:53   ECHOCARDIOGRAM COMPLETE  Result Date: 08/18/2021    ECHOCARDIOGRAM REPORT   Patient Name:   [name] Date of Exam: 08/18/2021 Medical Rec #:  [id number]     Height:       64.0 in Accession #:    [id number]    Weight:       230.0 lb Date of Birth:  06/15/1966     BSA:          2.076 m Patient Age:    89 years      BP:           136/76 mmHg Patient Gender: F             HR:           57 bpm. Exam Location:  Inpatient Procedure: 2D Echo, Color Doppler, Cardiac Doppler and Intracardiac            Opacification Agent Indications:    R07.9* Chest pain, unspecified  History:        Patient has prior history of Echocardiogram examinations, most                 recent 03/11/2018. CAD; Risk Factors:Hypertension, Dyslipidemia                 and Sleep Apnea.  Sonographer:    Raquel Sarna Senior RDCS Referring Phys: [place]  Sonographer Comments: Technically difficult due to poor echo windows. IMPRESSIONS  1. Left ventricular ejection fraction, by estimation, is 60 to 65%. The left ventricle has normal function. The left ventricle has no regional wall motion abnormalities. Left ventricular diastolic parameters were normal.  2. Right ventricular systolic function is normal. The right ventricular size is normal.  3. The mitral valve is normal in structure. No evidence of mitral valve regurgitation. No evidence of mitral stenosis.  4. The aortic valve was not well visualized. Aortic valve regurgitation is not visualized. No aortic stenosis is present.  5. The inferior vena cava is normal in size with greater than 50% respiratory variability, suggesting right atrial pressure of 3 mmHg. FINDINGS  Left Ventricle: Left ventricular ejection fraction, by estimation, is 60 to 65%. The left ventricle has normal function. The left  ventricle has no regional wall motion abnormalities. Definity contrast agent was given IV to delineate the left ventricular  endocardial borders. The left ventricular internal cavity size was normal in size. There is no left ventricular hypertrophy. Left ventricular diastolic parameters were normal. Right Ventricle: The right ventricular size is normal. Right vetricular wall thickness was not well visualized. Right ventricular systolic function is normal. Left Atrium: Left atrial size was normal in size. Right Atrium: Right atrial size was normal in size. Pericardium: There is no evidence of pericardial effusion. Mitral Valve: The mitral valve is normal in structure. No evidence of mitral valve regurgitation. No evidence of mitral valve stenosis. Tricuspid Valve: The tricuspid valve is normal in structure. Tricuspid valve regurgitation is trivial. No evidence of tricuspid stenosis. Aortic Valve: The aortic valve was not well visualized. Aortic valve regurgitation is not visualized. No aortic stenosis is present. Pulmonic Valve: The pulmonic valve was not well visualized. Pulmonic valve regurgitation is not visualized. No evidence of pulmonic stenosis. Aorta: The aortic root is normal in size and structure. Venous: The inferior vena cava is normal in size with greater than 50% respiratory variability, suggesting right atrial pressure of 3 mmHg. IAS/Shunts: The interatrial septum was not well visualized.  LEFT VENTRICLE PLAX 2D LVIDd:         3.60 cm   Diastology LVIDs:         2.60 cm   LV e' medial:    7.83 cm/s LV PW:         0.90 cm   LV E/e' medial:  12.6 LV IVS:        1.20 cm   LV e' lateral:   13.10 cm/s LVOT diam:     1.90 cm   LV E/e' lateral: 7.5 LV SV:         66 LV SV Index:   32 LVOT Area:     2.84 cm  RIGHT VENTRICLE RV S prime:     9.68 cm/s TAPSE (M-mode): 2.3 cm LEFT ATRIUM             Index        RIGHT ATRIUM           Index LA diam:        3.30 cm 1.59 cm/m   RA Area:     14.70 cm LA Vol (A2C):    58.3 ml 28.08 ml/m  RA Volume:   32.70 ml  15.75 ml/m LA Vol (A4C):   42.5 ml 20.47 ml/m LA Biplane Vol: 49.6 ml 23.89 ml/m  AORTIC VALVE LVOT Vmax:   93.40 cm/s LVOT Vmean:  70.100 cm/s LVOT VTI:    0.233 m  AORTA Ao Root diam: 3.00 cm MITRAL VALVE MV Area (PHT): 2.70 cm    SHUNTS MV Decel Time: 281 msec    Systemic VTI:  0.23 m MV E velocity: 98.50 cm/s  Systemic Diam: 1.90 cm MV A velocity: 74.90 cm/s MV E/A ratio:  1.32 Kirk Ruths MD Electronically  signed by Kirk Ruths MD Signature Date/Time: 08/18/2021/3:37:16 PM    Final    [place] EKG SITE RITE  Result Date: 08/20/2021 If Site Rite image not attached, placement could not be confirmed due to current cardiac rhythm.     Assessment/Plan:  INTERVAL HISTORY: MRI of the abdomen was performed showing 3 small liver abscesses   Principal Problem:   Bacteremia due to Klebsiella pneumoniae Active Problems:   Coronary artery disease   Mixed hyperlipidemia   Essential hypertension   GERD (gastroesophageal reflux disease)   Atypical chest pain   Hypotension   Thrombocytopenia (HCC)   Hyperglycemia   Hyponatremia   Hypokalemia   Elevated troponin   Obesity (BMI 30-39.9)   Asthma   Anxiety   Chest pain    Jaelyne A Kamiya is a 55 y.o. female with admission for sepsis due to Klebsiella pneumonia bacteremia likely from liver abscesses.  #1 Klebsiella pneumonia bacteremia with liver abscesses  --we will place PICC and plan on 4 weeks of IV ceftriaxone 2 g daily along with oral metronidazole 500 mg twice daily in case there is a anaerobic Co. pathogen with the Klebsiella.  We will plan on repeat imaging of the liver to ensure abscesses have resolved prior to stopping antibiotics.  I would likely follow the IV antibiotics with oral antibiotics likely Augmentin.  Diagnosis: Klebsiella pneumonia bacteremia and liver abscess  Culture Result: Cell pneumonia  Allergies  Allergen Reactions   Clomiphene Citrate Nausea And  Vomiting   Morphine And Related Other (See Comments)    PATIENT REFUSES THIS MEDICATION: states that she does not tolerate this medication well   Sulfa Antibiotics Other (See Comments)    Not sure ? rash   Clomiphene Rash    OPAT Orders Discharge antibiotics to be given via PICC line Discharge antibiotics: Ceftriaxone 2 g IV daily plus oral metronidazole 500 mg twice daily Duration: 4 weeks End Date: September 15, 2019  [place] Per Protocol:  Home health RN for IV administration and teaching; PICC line care and labs.    Labs weekly while on IV antibiotics: __x CBC with differential  x__ CMP   _x_ Please pull PIC at completion of IV antibiotics __ Please leave PIC in place until doctor has seen patient or been notified  Fax weekly labs to [phone]  Clinic Follow Up Appt:   Macon A Laham has an appointment on 09/09/2021 at 11am with Dr. Tommy Medal  The [facility] for Infectious Disease is located in the [facility] at   [address] in [place].  [address], which is located to the left of the elevators.  Phone: [phone]  Fax: [phone]  [email or web address]  She should arrive 15 to 30 minutes prior to her appointment.    I spent 36  minutes with the patient including  face to face counseling of the patient the nature of liver abscesses the origin nature of bloodstream infections and how we manage them, personally reviewing MRI of the liver along with updated culture data and with review of medical records in preparation for the visit and during the visit and in coordination of her care with ID pharmacy and home health  We will sign off for now please call with further questions.   LOS: 2 days   Alcide Evener 08/20/2021, 9:54 AM

## 2021-08-20 NOTE — Progress Notes (Signed)
Peripherally Inserted Central Catheter Placement  The IV Nurse has discussed with the patient and/or persons authorized to consent for the patient, the purpose of this procedure and the potential benefits and risks involved with this procedure.  The benefits include less needle sticks, lab draws from the catheter, and the patient may be discharged home with the catheter. Risks include, but not limited to, infection, bleeding, blood clot (thrombus formation), and puncture of an artery; nerve damage and irregular heartbeat and possibility to perform a PICC exchange if needed/ordered by physician.  Alternatives to this procedure were also discussed.  Bard Power PICC patient education guide, fact sheet on infection prevention and patient information card has been provided to patient /or left at bedside.    PICC Placement Documentation  PICC Single Lumen 08/20/21 Right Brachial 40 cm 0 cm (Active)  Indication for Insertion or Continuance of Line Home intravenous therapies (PICC only) 08/20/21 1622  Exposed Catheter (cm) 0 cm 08/20/21 1622  Site Assessment Clean;Dry;Intact 08/20/21 1622  Line Status Flushed;Saline locked;Blood return noted 08/20/21 1622  Dressing Type Transparent 08/20/21 1622  Dressing Status Clean;Dry;Intact 08/20/21 1622  Antimicrobial disc in place? Yes 08/20/21 1622  Dressing Intervention New dressing;Other (Comment) 08/20/21 1622  Dressing Change Due 08/27/21 08/20/21 1622       Shannon [name] 08/20/2021, 4:23 PM

## 2021-08-21 DIAGNOSIS — B961 Klebsiella pneumoniae [K. pneumoniae] as the cause of diseases classified elsewhere: Secondary | ICD-10-CM | POA: Diagnosis not present

## 2021-08-21 DIAGNOSIS — R7881 Bacteremia: Secondary | ICD-10-CM | POA: Diagnosis not present

## 2021-08-21 LAB — CULTURE, BLOOD (ROUTINE X 2)
Culture: NO GROWTH
Special Requests: ADEQUATE

## 2021-08-25 DIAGNOSIS — B961 Klebsiella pneumoniae [K. pneumoniae] as the cause of diseases classified elsewhere: Secondary | ICD-10-CM | POA: Diagnosis not present

## 2021-08-25 DIAGNOSIS — R0789 Other chest pain: Secondary | ICD-10-CM | POA: Diagnosis not present

## 2021-08-25 DIAGNOSIS — Z452 Encounter for adjustment and management of vascular access device: Secondary | ICD-10-CM | POA: Diagnosis not present

## 2021-08-25 DIAGNOSIS — R7881 Bacteremia: Secondary | ICD-10-CM | POA: Diagnosis not present

## 2021-08-25 DIAGNOSIS — R778 Other specified abnormalities of plasma proteins: Secondary | ICD-10-CM | POA: Diagnosis not present

## 2021-08-25 DIAGNOSIS — K750 Abscess of liver: Secondary | ICD-10-CM | POA: Diagnosis not present

## 2021-08-25 DIAGNOSIS — I2510 Atherosclerotic heart disease of native coronary artery without angina pectoris: Secondary | ICD-10-CM | POA: Diagnosis not present

## 2021-08-25 DIAGNOSIS — D696 Thrombocytopenia, unspecified: Secondary | ICD-10-CM | POA: Diagnosis not present

## 2021-08-25 DIAGNOSIS — I959 Hypotension, unspecified: Secondary | ICD-10-CM | POA: Diagnosis not present

## 2021-08-26 ENCOUNTER — Encounter: Payer: Self-pay | Admitting: Infectious Disease

## 2021-08-26 DIAGNOSIS — B961 Klebsiella pneumoniae [K. pneumoniae] as the cause of diseases classified elsewhere: Secondary | ICD-10-CM | POA: Diagnosis not present

## 2021-08-26 DIAGNOSIS — K750 Abscess of liver: Secondary | ICD-10-CM | POA: Diagnosis not present

## 2021-08-26 DIAGNOSIS — R7881 Bacteremia: Secondary | ICD-10-CM | POA: Diagnosis not present

## 2021-09-02 ENCOUNTER — Other Ambulatory Visit: Payer: Self-pay

## 2021-09-02 ENCOUNTER — Encounter (HOSPITAL_COMMUNITY): Payer: Self-pay | Admitting: *Deleted

## 2021-09-02 ENCOUNTER — Emergency Department (HOSPITAL_COMMUNITY)
Admission: EM | Admit: 2021-09-02 | Discharge: 2021-09-03 | Disposition: A | Discharge status: Home / Self Care | Payer: Medicare HMO | Source: Home / Self Care | Attending: Emergency Medicine | Admitting: Emergency Medicine

## 2021-09-02 ENCOUNTER — Encounter: Payer: Self-pay | Admitting: Infectious Disease

## 2021-09-02 DIAGNOSIS — Z79899 Other long term (current) drug therapy: Secondary | ICD-10-CM | POA: Diagnosis not present

## 2021-09-02 DIAGNOSIS — R7881 Bacteremia: Secondary | ICD-10-CM | POA: Diagnosis not present

## 2021-09-02 DIAGNOSIS — I110 Hypertensive heart disease with heart failure: Secondary | ICD-10-CM | POA: Insufficient documentation

## 2021-09-02 DIAGNOSIS — I2510 Atherosclerotic heart disease of native coronary artery without angina pectoris: Secondary | ICD-10-CM | POA: Insufficient documentation

## 2021-09-02 DIAGNOSIS — T82898A Other specified complication of vascular prosthetic devices, implants and grafts, initial encounter: Secondary | ICD-10-CM | POA: Insufficient documentation

## 2021-09-02 DIAGNOSIS — F17210 Nicotine dependence, cigarettes, uncomplicated: Secondary | ICD-10-CM | POA: Insufficient documentation

## 2021-09-02 DIAGNOSIS — Z452 Encounter for adjustment and management of vascular access device: Secondary | ICD-10-CM | POA: Diagnosis not present

## 2021-09-02 DIAGNOSIS — Z7982 Long term (current) use of aspirin: Secondary | ICD-10-CM | POA: Insufficient documentation

## 2021-09-02 DIAGNOSIS — J45909 Unspecified asthma, uncomplicated: Secondary | ICD-10-CM | POA: Insufficient documentation

## 2021-09-02 DIAGNOSIS — Z85828 Personal history of other malignant neoplasm of skin: Secondary | ICD-10-CM | POA: Insufficient documentation

## 2021-09-02 DIAGNOSIS — Y828 Other medical devices associated with adverse incidents: Secondary | ICD-10-CM | POA: Insufficient documentation

## 2021-09-02 DIAGNOSIS — Z20822 Contact with and (suspected) exposure to covid-19: Secondary | ICD-10-CM | POA: Diagnosis not present

## 2021-09-02 DIAGNOSIS — I5032 Chronic diastolic (congestive) heart failure: Secondary | ICD-10-CM | POA: Insufficient documentation

## 2021-09-02 DIAGNOSIS — I82621 Acute embolism and thrombosis of deep veins of right upper extremity: Secondary | ICD-10-CM | POA: Diagnosis not present

## 2021-09-02 DIAGNOSIS — R2231 Localized swelling, mass and lump, right upper limb: Secondary | ICD-10-CM | POA: Diagnosis present

## 2021-09-02 DIAGNOSIS — Z96643 Presence of artificial hip joint, bilateral: Secondary | ICD-10-CM | POA: Insufficient documentation

## 2021-09-02 DIAGNOSIS — Z96641 Presence of right artificial hip joint: Secondary | ICD-10-CM | POA: Diagnosis not present

## 2021-09-02 NOTE — ED Triage Notes (Signed)
Pt with PICC line to right arm for IV antibiotics for abscess to liver.  Pt states nurse changed the dressing today and had no problems til today, unable to flush at home.

## 2021-09-02 NOTE — Progress Notes (Addendum)
Cardiology Office Note:    Date:  09/11/2021   ID:  Shannon [name], Shannon [name] Nov 19, 1965, MRN [id number]  PCP:  Carolee Rota, NP   [facility] Providers Cardiologist:  None {   Referring MD: Carolee Rota, NP    History of Present Illness:    Shannon [name] is a 55 y.o. female with a hx of CAD (s/p DES to LCx in 2014, cath in 07/2018 showing patent stent with mild to moderate nonobstructive disease involving the mid-LAD and RCA, low-risk NST in 11/2020), carotid artery stenosis (known LICA occlusion), HTN, HLD and tobacco use who presents to clinic for follow-up.  Per review of the record, she had a stress test in 11/2020 done for pre-op evaluation showing small, mild intensity, mid to apical anteroseptal defect that is partially reversible suggestive of either variable soft tissue attenuation or a mild ischemic territory, low risk, EF 69%.   She then was seen in the ED on 08/14/21 with back pain radiating to her chest. Trops negative. CT PE showed no PE but demonstrated 11mm lingular nodule. She then represented a couple of days later with enterobacteria/kleb bacteremia. Cardiology was consulted on that admission for chest pain and mildly elevated troponin that was deemed atypical and unlikely cardiac in nature. TTE 08/18/21 with EF 60-65%, normal RV, no significant valve disease.  Today, the patient states she feels overwhelmed with everything that has been going on. She has been seen by ID with plans for 2 more months of antibiotics. She is also concerned with the clot in her arm and is wondering if it may travel to her heart. She is currently taking apixaban without bleeding issues. No chest pain, SOB, lightheaded, dizziness, or syncope. Has right UE swelling from the DVT.  Blood pressures fairly well controlled with 120-130s.   Past Medical History:  Diagnosis Date   Anxiety    Arthritis    oa needs hip replacement on right   Asthma    allergies    Cancer ([place])    squamous cell  areas removed from vulva and pre caner areas removed from leg    Carotid artery occlusion    Complication of anesthesia    major depression after general anesthesia   Coronary artery disease    LHC 11/25/12 90% stenosis mid LCx & otherwise nonobstructive dz w/ EF 60-65% S/p PTCA/DES to LCx   Depression    Dyspnea    with exertion    Elevated cholesterol    Exertional dyspnea    chronic   Fibromyalgia    GERD (gastroesophageal reflux disease)    History of cardiovascular stress test 06/2015   low risk   HPV in female    Hyperlipidemia    Hypertension    Liver abscess 09/09/2021   MI (myocardial infarction) ([place]) fe 17, 2014   Obesity    Pneumonia    hx of x 2 years ago    Polycystic ovary disease    Skin tear of upper arm without complication, left, sequela    small skin tear left upper arm no drainage for 1 week   Sleep apnea    mild no cpap    Tobacco abuse     Past Surgical History:  Procedure Laterality Date   CORONARY ANGIOPLASTY WITH STENT PLACEMENT     LEFT HEART CATH AND CORONARY ANGIOGRAPHY N/A 08/09/2018   Procedure: LEFT HEART CATH AND CORONARY ANGIOGRAPHY;  Surgeon: Nelva Bush, MD;  Location: [place] [facility];  Service: Cardiovascular;  Laterality: N/A;   LEFT HEART CATHETERIZATION WITH CORONARY ANGIOGRAM N/A 11/15/2012   Procedure: LEFT HEART CATHETERIZATION WITH CORONARY ANGIOGRAM;  Surgeon: Thayer Headings, MD;  Location: [facility] CATH LAB;  Service: Cardiovascular;  Laterality: N/A;   PERCUTANEOUS CORONARY STENT INTERVENTION (PCI-S)  11/15/2012   Procedure: PERCUTANEOUS CORONARY STENT INTERVENTION (PCI-S);  Surgeon: Thayer Headings, MD;  Location[facility] CATH LAB;  Service: Cardiovascular;;   skin cancer removed right lower leg Right    TONSILLECTOMY     TOTAL HIP ARTHROPLASTY Right 01/16/2021   Procedure: TOTAL HIP ARTHROPLASTY ANTERIOR APPROACH;  Surgeon: Gaynelle Arabian, MD;  Location: [facility];  Service: Orthopedics;  Laterality: Right;  17min   uterus ablation      VULVECTOMY N/A 11/08/2019   Procedure: excision of VULVA, vulvoscopy;  Surgeon: Dian Queen, MD;  Location: [facility];  Service: Gynecology;  Laterality: N/A;    Current Medications: Current Meds  Medication Sig   albuterol (PROVENTIL HFA;VENTOLIN HFA) 108 (90 Base) MCG/ACT inhaler Inhale 2 puffs into the lungs every 4 (four) hours as needed for wheezing or shortness of breath.   ALPRAZolam (XANAX) 1 MG tablet Take 1 mg by mouth 2 (two) times daily as needed for anxiety.   amoxicillin-clavulanate (AUGMENTIN) 875-125 MG tablet Take 1 tablet by mouth every 12 (twelve) hours.   apixaban (ELIQUIS) 5 MG TABS tablet Take 2 tablets (10mg ) twice daily for 7 days , on 09/12/21 start taking 1 tablet (5mg ) twice daily for at least 3 months   aspirin EC 81 MG tablet Take 1 tablet (81 mg total) by mouth daily. Swallow whole.   bisoprolol-hydrochlorothiazide (ZIAC) 5-6.25 MG tablet Take 1 tablet by mouth daily. Pt takes in the pm   escitalopram (LEXAPRO) 20 MG tablet Take 20 mg by mouth at bedtime. Takes in the pm   ezetimibe (ZETIA) 10 MG tablet Take 1 tablet (10 mg total) by mouth daily.   fluticasone (FLONASE) 50 MCG/ACT nasal spray Place 2 sprays into both nostrils at bedtime.   nicotine (NICODERM CQ - DOSED IN MG/24 HOURS) 21 mg/24hr patch Place 1 patch (21 mg total) onto the skin daily.   nitroGLYCERIN (NITROSTAT) 0.4 MG SL tablet Place 0.4 mg under the tongue every 5 (five) minutes as needed for chest pain.   oxyCODONE (OXY IR/ROXICODONE) 5 MG immediate release tablet Take 1 tablet (5 mg total) by mouth every 6 (six) hours as needed for severe pain or moderate pain.   pantoprazole (PROTONIX) 40 MG tablet Take 40 mg by mouth daily.   rosuvastatin (CRESTOR) 10 MG tablet Take 10 mg by mouth at bedtime.   sodium chloride flush (NS) 0.9 % SOLN 10-40 mLs by Intracatheter route every 12 (twelve) hours.   triamcinolone cream (KENALOG) 0.1 % Apply 1 application topically 2 (two) times  daily. (Patient taking differently: Apply 1 application topically 2 (two) times daily as needed (irritation).)     Allergies:   Clomiphene citrate, Morphine and related, Sulfa antibiotics, and Clomiphene   Social History   Socioeconomic History   Marital status: Legally Separated    Spouse name: Not on file   Number of children: Not on file   Years of education: Not on file   Highest education level: Not on file  Occupational History   Not on file  Tobacco Use   Smoking status: Every Day    Packs/day: 2.00    Years: 40.00    Pack years: 80.00    Types: Cigarettes    Start  date: 03/29/1979   Smokeless tobacco: Never   Tobacco comments:    Currently 2ppd  Vaping Use   Vaping Use: Never used  Substance and Sexual Activity   Alcohol use: Not Currently    Comment: rare   Drug use: No   Sexual activity: Not Currently    Partners: Male  Other Topics Concern   Not on file  Social History Narrative   Lives with husband in a 3 story home.  Has no children.     On disability.     Education: high school, some college.   Social Determinants of Health   Financial Resource Strain: Not on file  Food Insecurity: Not on file  Transportation Needs: Not on file  Physical Activity: Not on file  Stress: Not on file  Social Connections: Not on file     Family History: The patient's family history includes Anxiety disorder in her maternal grandmother and paternal grandmother; Cerebral aneurysm in her father; Depression in her mother and paternal grandmother; Heart attack in her father; Heart disease in her father; OCD in her paternal grandmother.  ROS:   Please see the history of present illness.    Review of Systems  Constitutional:  Positive for malaise/fatigue.  Respiratory:  Negative for shortness of breath.   Cardiovascular:  Negative for chest pain, palpitations, orthopnea, claudication, leg swelling and PND.  Gastrointestinal:  Negative for blood in stool and melena.   Genitourinary:  Negative for hematuria.  Neurological:  Negative for dizziness and loss of consciousness.  Psychiatric/Behavioral:  Positive for depression.     EKGs/Labs/Other Studies Reviewed:    The following studies were reviewed today: TTE 09/09/2021: IMPRESSIONS   1. Left ventricular ejection fraction, by estimation, is 60 to 65%. The  left ventricle has normal function. The left ventricle has no regional  wall motion abnormalities. Left ventricular diastolic parameters were  normal.   2. Right ventricular systolic function is normal. The right ventricular  size is normal.   3. The mitral valve is normal in structure. No evidence of mitral valve  regurgitation. No evidence of mitral stenosis.   4. The aortic valve was not well visualized. Aortic valve regurgitation  is not visualized. No aortic stenosis is present.   5. The inferior vena cava is normal in size with greater than 50%  respiratory variability, suggesting right atrial pressure of 3 mmHg.  Cath Sep 09, 2018 Conclusions: Mild to moderate, non-obstructive coronary artery disease involving the mid LAD and RCA. Widely patent proximal/mid LCx stent. Normal left ventricular contraction with mildly elevated filling pressure.   Recommendations: Medical therapy and aggressive secondary prevention, including smoking cessation, weight loss, and lipid control.  Consider increasing statin therapy if LDL not at goal (<70). Start furosemide 20 mg daily for component of diastolic heart failure.   Recommend Aspirin 81mg  daily for moderate CAD.    Nelva Bush, MD [facility] Pager: [phone]   2D echo 02/2018  - Procedure narrative: Transthoracic echocardiography. Image    quality was suboptimal. Contrast enhancement was employed.  - Left ventricle: The cavity size was normal. Wall thickness was    increased in a pattern of mild LVH. Systolic function was normal.    The estimated ejection fraction was in the range of  55% to 60%.    Wall motion was normal; there were no regional wall motion    abnormalities. Left ventricular diastolic function parameters    were normal.   EKG:  No new tracing  Recent Labs:  08/19/2021: Magnesium 2.1 09/05/2021: ALT 20; BUN 11; Creatinine, Ser 0.88; Hemoglobin 12.2; Platelets 105; Potassium 3.8; Sodium 134  Recent Lipid Panel    Component Value Date/Time   CHOL 194 08/10/2018 0539   TRIG 264 (H) 08/10/2018 0539   HDL 36 (L) 08/10/2018 0539   CHOLHDL 5.4 08/10/2018 0539   VLDL 53 (H) 08/10/2018 0539   LDLCALC 105 (H) 08/10/2018 0539   LDLDIRECT 200.3 09/04/2009 1308          Physical Exam:    VS:  BP (!) 144/78   Pulse 65   Ht 5' 4.5" (1.638 m)   Wt 228 lb 6.4 oz (103.6 kg)   SpO2 98%   BMI 38.60 kg/m     Wt Readings from Last 3 Encounters:  09/11/21 228 lb 6.4 oz (103.6 kg)  09/09/21 230 lb (104.3 kg)  09/04/21 233 lb 11 oz (106 kg)     GEN:  Well nourished, well developed in no acute distress HEENT: Normal NECK: No JVD; Right carotid bruit CARDIAC: RRR, no murmurs, rubs, gallops RESPIRATORY:  Clear to auscultation without rales, wheezing or rhonchi  ABDOMEN: Soft, non-tender, non-distended MUSCULOSKELETAL:  Trace RUE swelling. No LE edema. Warm.  SKIN: Warm and dry NEUROLOGIC:  Alert and oriented x 3 PSYCHIATRIC:  Normal affect   ASSESSMENT:    1. Coronary artery disease involving native coronary artery of native heart without angina pectoris   2. Statin intolerance   3. Essential hypertension   4. Hyperlipidemia LDL goal <70   5. Tobacco abuse counseling   6. Bilateral carotid artery stenosis    PLAN:    In order of problems listed above:  #Known CAD s/p PCI to Lcx: Stable on cath in 2019 with patent Lcx stent and mild RCA and LAD disease. No anginal symptoms. -Continue ASA 81mg  daily -Continue crestor 20mg  daily -Will refer to lipid clinic for PCKS9i due to symptoms with statins -Continue bisoprolol-HCTZ 5-6.25mg   daily -Discussed tobacco cessation today  #RUE DVT: Presented to [facility] hospital with RUE swelling found to have RUE DVT surrounding PICC line. Now on apixaban. -Continue apixaban 5mg  BID for 3 months for provoked RUE DVT  #Carotid Artery disease: Showed total occlusion in the LICA and 30-86% in the RICA. Plan to follow-up with vascular. -Continue ASA 81mg  daily -Continue crestor 20mg  daily -Will refer to lipid clinic for PCKS9i due to symptoms with statins -Follow-up with vascular as scheduled  #Atypical Chest Pain: Reassuring cardiac work-up with low risk myoview (small, mild intensity, mid to apical anteroseptal defect that is partially reversible suggestive of either variable soft tissue attenuation or a mild ischemic territory) and TTE with LVEF 60-65%, no significant valve disease. Deemed likely to be related to chronic back pain. -Continue ASA 81mg  daily -Continue crestor 20mg  daily  #HTN: Controlled at home. -Continue bisoprolol-HCTZ 5-6.25mg  daily  #HLD: LDL 103 in 11/2019. Has muscle aches with statins and is very interested in PCSK9i. -Crestor 20mg  daily -Start zetia 10mg  daily -Refer to lipid clinic -Goal LDL<55  #Pulmonary Nodule: Recommended for repeat CT scan.  #Klebsiella Bacteremia: -On augmentin per ID  #Tobacco Abuse: Discussed importance of quitting at length today. Patient is going to try and really wants to cut back.        Medication Adjustments/Labs and Tests Ordered: Current medicines are reviewed at length with the patient today.  Concerns regarding medicines are outlined above.  Orders Placed This Encounter  Procedures   AMB Referral to [facility]  Meds ordered this encounter  Medications   ezetimibe (ZETIA) 10 MG tablet    Sig: Take 1 tablet (10 mg total) by mouth daily.    Dispense:  90 tablet    Refill:  3    Patient Instructions  Medication Instructions:  START Zetia 10 mg daily   Labwork: None  today  Testing/Procedures: None today  Follow-Up: 6 months  Any Other Special Instructions Will Be Listed Below (If Applicable).    You have been referred to [facility] in [place]. They will call you for an appointment.   If you need a refill on your cardiac medications before your next appointment, please call your pharmacy.   Signed, Freada Bergeron, MD  09/11/2021 2:47 PM    [place]

## 2021-09-03 ENCOUNTER — Other Ambulatory Visit: Payer: Self-pay

## 2021-09-03 ENCOUNTER — Encounter (HOSPITAL_COMMUNITY): Payer: Self-pay

## 2021-09-03 ENCOUNTER — Emergency Department (HOSPITAL_BASED_OUTPATIENT_CLINIC_OR_DEPARTMENT_OTHER): Payer: Medicare HMO

## 2021-09-03 ENCOUNTER — Observation Stay (HOSPITAL_COMMUNITY)
Admission: EM | Admit: 2021-09-03 | Discharge: 2021-09-05 | Disposition: A | Discharge status: Home / Self Care | Payer: Medicare HMO | Attending: Family Medicine | Admitting: Family Medicine

## 2021-09-03 DIAGNOSIS — Z96641 Presence of right artificial hip joint: Secondary | ICD-10-CM | POA: Insufficient documentation

## 2021-09-03 DIAGNOSIS — I5032 Chronic diastolic (congestive) heart failure: Secondary | ICD-10-CM | POA: Diagnosis present

## 2021-09-03 DIAGNOSIS — K219 Gastro-esophageal reflux disease without esophagitis: Secondary | ICD-10-CM | POA: Diagnosis not present

## 2021-09-03 DIAGNOSIS — I779 Disorder of arteries and arterioles, unspecified: Secondary | ICD-10-CM | POA: Diagnosis not present

## 2021-09-03 DIAGNOSIS — B961 Klebsiella pneumoniae [K. pneumoniae] as the cause of diseases classified elsewhere: Secondary | ICD-10-CM | POA: Diagnosis present

## 2021-09-03 DIAGNOSIS — I82621 Acute embolism and thrombosis of deep veins of right upper extremity: Secondary | ICD-10-CM | POA: Diagnosis not present

## 2021-09-03 DIAGNOSIS — R7881 Bacteremia: Secondary | ICD-10-CM | POA: Diagnosis not present

## 2021-09-03 DIAGNOSIS — Z20822 Contact with and (suspected) exposure to covid-19: Secondary | ICD-10-CM | POA: Insufficient documentation

## 2021-09-03 DIAGNOSIS — Z85828 Personal history of other malignant neoplasm of skin: Secondary | ICD-10-CM | POA: Insufficient documentation

## 2021-09-03 DIAGNOSIS — E782 Mixed hyperlipidemia: Secondary | ICD-10-CM | POA: Diagnosis present

## 2021-09-03 DIAGNOSIS — I2510 Atherosclerotic heart disease of native coronary artery without angina pectoris: Secondary | ICD-10-CM | POA: Diagnosis present

## 2021-09-03 DIAGNOSIS — T82898A Other specified complication of vascular prosthetic devices, implants and grafts, initial encounter: Secondary | ICD-10-CM | POA: Insufficient documentation

## 2021-09-03 DIAGNOSIS — R609 Edema, unspecified: Secondary | ICD-10-CM

## 2021-09-03 DIAGNOSIS — I25118 Atherosclerotic heart disease of native coronary artery with other forms of angina pectoris: Secondary | ICD-10-CM | POA: Diagnosis not present

## 2021-09-03 DIAGNOSIS — Z7982 Long term (current) use of aspirin: Secondary | ICD-10-CM | POA: Insufficient documentation

## 2021-09-03 DIAGNOSIS — Y828 Other medical devices associated with adverse incidents: Secondary | ICD-10-CM | POA: Insufficient documentation

## 2021-09-03 DIAGNOSIS — I110 Hypertensive heart disease with heart failure: Secondary | ICD-10-CM | POA: Insufficient documentation

## 2021-09-03 DIAGNOSIS — I82409 Acute embolism and thrombosis of unspecified deep veins of unspecified lower extremity: Secondary | ICD-10-CM | POA: Diagnosis present

## 2021-09-03 DIAGNOSIS — Z79899 Other long term (current) drug therapy: Secondary | ICD-10-CM | POA: Insufficient documentation

## 2021-09-03 DIAGNOSIS — I10 Essential (primary) hypertension: Secondary | ICD-10-CM | POA: Diagnosis present

## 2021-09-03 DIAGNOSIS — J45909 Unspecified asthma, uncomplicated: Secondary | ICD-10-CM | POA: Insufficient documentation

## 2021-09-03 DIAGNOSIS — F419 Anxiety disorder, unspecified: Secondary | ICD-10-CM | POA: Diagnosis present

## 2021-09-03 DIAGNOSIS — F17210 Nicotine dependence, cigarettes, uncomplicated: Secondary | ICD-10-CM | POA: Insufficient documentation

## 2021-09-03 LAB — CBC WITH DIFFERENTIAL/PLATELET
Abs Immature Granulocytes: 0.03 10*3/uL (ref 0.00–0.07)
Basophils Absolute: 0.1 10*3/uL (ref 0.0–0.1)
Basophils Relative: 1 %
Eosinophils Absolute: 0.1 10*3/uL (ref 0.0–0.5)
Eosinophils Relative: 2 %
HCT: 44.7 % (ref 36.0–46.0)
Hemoglobin: 14.5 g/dL (ref 12.0–15.0)
Immature Granulocytes: 1 %
Lymphocytes Relative: 36 %
Lymphs Abs: 2.2 10*3/uL (ref 0.7–4.0)
MCH: 29.2 pg (ref 26.0–34.0)
MCHC: 32.4 g/dL (ref 30.0–36.0)
MCV: 89.9 fL (ref 80.0–100.0)
Monocytes Absolute: 0.5 10*3/uL (ref 0.1–1.0)
Monocytes Relative: 9 %
Neutro Abs: 3.2 10*3/uL (ref 1.7–7.7)
Neutrophils Relative %: 51 %
Platelets: 145 10*3/uL — ABNORMAL LOW (ref 150–400)
RBC: 4.97 MIL/uL (ref 3.87–5.11)
RDW: 14.5 % (ref 11.5–15.5)
WBC: 6.2 10*3/uL (ref 4.0–10.5)
nRBC: 0 % (ref 0.0–0.2)

## 2021-09-03 LAB — BASIC METABOLIC PANEL
Anion gap: 9 (ref 5–15)
BUN: 6 mg/dL (ref 6–20)
CO2: 24 mmol/L (ref 22–32)
Calcium: 8.9 mg/dL (ref 8.9–10.3)
Chloride: 105 mmol/L (ref 98–111)
Creatinine, Ser: 0.79 mg/dL (ref 0.44–1.00)
GFR, Estimated: >60 mL/min (ref >60)
Glucose, Bld: 99 mg/dL (ref 70–99)
Potassium: 3.9 mmol/L (ref 3.5–5.1)
Sodium: 138 mmol/L (ref 135–145)

## 2021-09-03 LAB — RESP PANEL BY RT-PCR (FLU A&B, COVID) ARPGX2
Influenza A by PCR: NEGATIVE
Influenza B by PCR: NEGATIVE
SARS Coronavirus 2 by RT PCR: NEGATIVE

## 2021-09-03 MED ORDER — PANTOPRAZOLE SODIUM 40 MG PO TBEC
40.0000 mg | DELAYED_RELEASE_TABLET | Freq: Every day | ORAL | Status: DC
  Administered 2021-09-04 – 2021-09-05 (×2): 40 mg via ORAL
  Filled 2021-09-03 (×2): qty 1

## 2021-09-03 MED ORDER — ROSUVASTATIN CALCIUM 20 MG PO TABS
20.0000 mg | ORAL_TABLET | Freq: Every day | ORAL | Status: DC
  Administered 2021-09-04: 20 mg via ORAL
  Filled 2021-09-03: qty 1

## 2021-09-03 MED ORDER — ACETAMINOPHEN 325 MG PO TABS: 650.0000 mg | ORAL_TABLET | Freq: Four times a day (QID) | ORAL | Status: DC | PRN

## 2021-09-03 MED ORDER — SODIUM CHLORIDE 0.9% FLUSH
3.0000 mL | Freq: Two times a day (BID) | INTRAVENOUS | Status: DC
  Administered 2021-09-04 – 2021-09-05 (×3): 3 mL via INTRAVENOUS

## 2021-09-03 MED ORDER — BISOPROLOL-HYDROCHLOROTHIAZIDE 5-6.25 MG PO TABS: 1.0000 | ORAL_TABLET | Freq: Every day | ORAL | Status: DC

## 2021-09-03 MED ORDER — ALBUTEROL SULFATE (2.5 MG/3ML) 0.083% IN NEBU: 3.0000 mL | INHALATION_SOLUTION | RESPIRATORY_TRACT | Status: DC | PRN

## 2021-09-03 MED ORDER — HYDROMORPHONE HCL 1 MG/ML IJ SOLN
0.5000 mg | Freq: Once | INTRAMUSCULAR | Status: AC
  Administered 2021-09-03: 0.5 mg via INTRAVENOUS
  Filled 2021-09-03: qty 1

## 2021-09-03 MED ORDER — METRONIDAZOLE 500 MG/100ML IV SOLN
500.0000 mg | Freq: Two times a day (BID) | INTRAVENOUS | Status: DC
  Administered 2021-09-03 – 2021-09-04 (×3): 500 mg via INTRAVENOUS
  Filled 2021-09-03 (×3): qty 100

## 2021-09-03 MED ORDER — ACETAMINOPHEN 650 MG RE SUPP: 650.0000 mg | Freq: Four times a day (QID) | RECTAL | Status: DC | PRN

## 2021-09-03 MED ORDER — HEPARIN BOLUS VIA INFUSION
5000.0000 [IU] | Freq: Once | INTRAVENOUS | Status: AC
  Administered 2021-09-03: 5000 [IU] via INTRAVENOUS
  Filled 2021-09-03: qty 5000

## 2021-09-03 MED ORDER — SODIUM CHLORIDE 0.9 % IV SOLN
2.0000 g | INTRAVENOUS | Status: DC
  Administered 2021-09-03 – 2021-09-04 (×2): 2 g via INTRAVENOUS
  Filled 2021-09-03 (×2): qty 20

## 2021-09-03 MED ORDER — ASPIRIN EC 81 MG PO TBEC
81.0000 mg | DELAYED_RELEASE_TABLET | Freq: Every day | ORAL | Status: DC
  Administered 2021-09-04 – 2021-09-05 (×2): 81 mg via ORAL
  Filled 2021-09-03 (×2): qty 1

## 2021-09-03 MED ORDER — HEPARIN (PORCINE) 25000 UT/250ML-% IV SOLN
1300.0000 [IU]/h | INTRAVENOUS | Status: DC
  Administered 2021-09-03: 1750 [IU]/h via INTRAVENOUS
  Administered 2021-09-04: 1550 [IU]/h via INTRAVENOUS
  Administered 2021-09-05: 1400 [IU]/h via INTRAVENOUS
  Filled 2021-09-03 (×3): qty 250

## 2021-09-03 NOTE — H&P (Addendum)
History and Physical   Shannon [name]:[id number] DOB: May 02, 1966 DOA: 09/03/2021  PCP: Carolee Rota, NP   Patient coming from: Home  Chief Complaint: Right upper extremity swelling, difficulty flushing PICC  HPI: Shannon [name] is a 55 y.o. female with medical history significant of anxiety, asthma, CAD, carotid artery disease, CHF, hypertension, GERD, hyperlipidemia, thrombocytopenia presenting with difficulty flushing her PICC line and right upper semiswelling.  Has had right upper extremity edema for 1 day.  She was recently admitted from 11/18 until 11/22.  Admitted for chest pain found to have Klebsiella bacteremia thought to be secondary to liver abscesses.  PICC line placed 11/22 discharged with ceftriaxone and Flagyl through 12/17.  In the ED last night unable to flush but they were able to flush it in the ED at that time.  Came back again today for similar problem and arm swelling.  Last antibiotic dose was at 2 AM.  Typically does antibiotic doses in the evening so will be due for it soon.  Denies fevers, chills, chest pain, shortness of breath, abdominal pain, constipation, diarrhea, nausea, vomiting.   ED Course: Vital signs in the ED stable.  Lab work-up showed BMP within normal limits.  CBC with platelets of 145 improved from previous admission around 70. Magnesium level pending.  Right upper extremity venous duplex showed acute DVT of the right axillary, brachial and basilic vein all surrounding the PICC line.  Carotic Doppler was also performed which showed right ICA stenosis of 80 to 99% and left carotid total occlusion.  This is worse from 2019 with left carotid of 80 to 99% and right ICA in the 60s to 70s range.  Received a dose of Dilaudid and heparin was ordered in the ED.  Case was discussed with IR by phone who recommended heparinizing and additional intervention once therapeutic, they are not formally consulting yet.  Review of Systems: As per HPI otherwise all other  systems reviewed and are negative.  Past Medical History:  Diagnosis Date   Anxiety    Arthritis    oa needs hip replacement on right   Asthma    allergies    Cancer ([place])    squamous cell areas removed from vulva and pre caner areas removed from leg    Carotid artery occlusion    Complication of anesthesia    major depression after general anesthesia   Coronary artery disease    LHC 11/25/12 90% stenosis mid LCx & otherwise nonobstructive dz w/ EF 60-65% S/p PTCA/DES to LCx   Depression    Dyspnea    with exertion    Elevated cholesterol    Exertional dyspnea    chronic   Fibromyalgia    GERD (gastroesophageal reflux disease)    History of cardiovascular stress test 06/2015   low risk   HPV in female    Hyperlipidemia    Hypertension    MI (myocardial infarction) ([place]) fe 17, 2014   Obesity    Pneumonia    hx of x 2 years ago    Polycystic ovary disease    Skin tear of upper arm without complication, left, sequela    small skin tear left upper arm no drainage for 1 week   Sleep apnea    mild no cpap    Tobacco abuse     Past Surgical History:  Procedure Laterality Date   CORONARY ANGIOPLASTY WITH STENT PLACEMENT     LEFT HEART CATH AND CORONARY ANGIOGRAPHY N/A 08/09/2018  Procedure: LEFT HEART CATH AND CORONARY ANGIOGRAPHY;  Surgeon: Yvonne Kendall, MD;  Location: [facility] INVASIVE CV LAB;  Service: Cardiovascular;  Laterality: N/A;   LEFT HEART CATHETERIZATION WITH CORONARY ANGIOGRAM N/A 11/15/2012   Procedure: LEFT HEART CATHETERIZATION WITH CORONARY ANGIOGRAM;  Surgeon: Vesta Mixer, MD;  Location: [facility] CATH LAB;  Service: Cardiovascular;  Laterality: N/A;   PERCUTANEOUS CORONARY STENT INTERVENTION (PCI-S)  11/15/2012   Procedure: PERCUTANEOUS CORONARY STENT INTERVENTION (PCI-S);  Surgeon: Vesta Mixer, MD;  Location: [facility] CATH LAB;  Service: Cardiovascular;;   skin cancer removed right lower leg Right    TONSILLECTOMY     TOTAL HIP ARTHROPLASTY Right 01/16/2021    Procedure: TOTAL HIP ARTHROPLASTY ANTERIOR APPROACH;  Surgeon: Ollen Gross, MD;  Location: [facility] ORS;  Service: Orthopedics;  Laterality: Right;    uterus ablation     VULVECTOMY N/A 11/08/2019   Procedure: excision of VULVA, vulvoscopy;  Surgeon: Marcelle Overlie, MD;  Location: [facility] [place];  Service: Gynecology;  Laterality: N/A;    Social History  reports that she has been smoking cigarettes. She started smoking about 42 years ago. She has a 80.00 pack-year smoking history. She has never used smokeless tobacco. She reports that she does not currently use alcohol. She reports that she does not use drugs.  Allergies  Allergen Reactions   Clomiphene Citrate Nausea And Vomiting   Morphine And Related Other (See Comments)    PATIENT REFUSES THIS MEDICATION: states that she does not tolerate this medication well   Sulfa Antibiotics Other (See Comments)    Not sure ? rash   Clomiphene Rash    Family History  Problem Relation Age of Onset   Depression Mother    Anxiety disorder Maternal Grandmother    Anxiety disorder Paternal Grandmother    OCD Paternal Grandmother    Depression Paternal Grandmother    Heart attack Father        Deceased, 68   Cerebral aneurysm Father    Heart disease Father   Reviewed on admission  Prior to Admission medications   Medication Sig Start Date End Date Taking? Authorizing Provider  albuterol (PROVENTIL HFA;VENTOLIN HFA) 108 (90 Base) MCG/ACT inhaler Inhale 2 puffs into the lungs every 4 (four) hours as needed for wheezing or shortness of breath. 12/21/15   Ward, Layla Maw, DO  ALPRAZolam Prudy Feeler) 1 MG tablet Take 1 mg by mouth 2 (two) times daily as needed for anxiety. 06/12/17   [provider]  aspirin EC 81 MG tablet Take 81 mg by mouth daily. Swallow whole.    [provider]  bisoprolol-hydrochlorothiazide (ZIAC) 5-6.25 MG tablet Take 1 tablet by mouth daily. Pt takes in the pm 12/21/19   [provider]   cefTRIAXone (ROCEPHIN) IVPB Inject 2 g into the vein daily for 25 days. Indication:  Kleb PNA bacteremia + liver abscess First Dose: Yes Last Day of Therapy:  09/14/21 Labs - Once weekly:  CBC/D and BMP, Labs - Every other week:  ESR and CRP Method of administration: IV Push Method of administration may be changed at the discretion of home infusion pharmacist based upon assessment of the patient and/or caregiver's ability to self-administer the medication ordered. 08/20/21 09/14/21  Pokhrel, Rebekah Chesterfield, MD  escitalopram (LEXAPRO) 20 MG tablet Take 20 mg by mouth daily. Takes in the pm    [provider]  fluticasone (FLONASE) 50 MCG/ACT nasal spray Place 2 sprays into both nostrils at bedtime.    [provider]  metroNIDAZOLE (FLAGYL) 500 MG tablet Take 1 tablet (500 mg total) by mouth 2 (two) times daily for 25 days. 08/20/21 09/14/21  Pokhrel, Corrie Mckusick, MD  nicotine (NICODERM CQ - DOSED IN MG/24 HOURS) 21 mg/24hr patch Place 1 patch (21 mg total) onto the skin daily. 08/21/21   Pokhrel, Corrie Mckusick, MD  oxyCODONE (OXY IR/ROXICODONE) 5 MG immediate release tablet Take 1 tablet (5 mg total) by mouth every 6 (six) hours as needed for severe pain or moderate pain. 08/20/21   Pokhrel, Corrie Mckusick, MD  pantoprazole (PROTONIX) 40 MG tablet Take 40 mg by mouth daily.    [provider]  rosuvastatin (CRESTOR) 20 MG tablet Take 1 tablet (20 mg total) by mouth at bedtime. 08/10/18   Isaiah Serge, NP  sodium chloride flush (NS) 0.9 % SOLN 10-40 mLs by Intracatheter route every 12 (twelve) hours. 08/20/21   Pokhrel, Corrie Mckusick, MD  triamcinolone cream (KENALOG) 0.1 % Apply 1 application topically 2 (two) times daily. Patient taking differently: Apply 1 application topically 2 (two) times daily as needed (irritation). 01/09/20   Scot Jun, FNP    Physical Exam: Vitals:   09/03/21 1815 09/03/21 1845 09/03/21 1945 09/03/21 2015  BP: 127/69 (!) 136/106  129/79  Pulse:   76 63  Resp:    15 19  Temp:      TempSrc:      SpO2:   99% 99%  Weight:      Height:       Physical Exam Constitutional:      General: She is not in acute distress.    Appearance: Normal appearance. She is obese.  HENT:     Head: Normocephalic and atraumatic.     Mouth/Throat:     Mouth: Mucous membranes are moist.     Pharynx: Oropharynx is clear.  Eyes:     Extraocular Movements: Extraocular movements intact.     Pupils: Pupils are equal, round, and reactive to light.  Cardiovascular:     Rate and Rhythm: Normal rate and regular rhythm.     Pulses: Normal pulses.     Heart sounds: Normal heart sounds.  Pulmonary:     Effort: Pulmonary effort is normal. No respiratory distress.     Breath sounds: Normal breath sounds.  Abdominal:     General: Bowel sounds are normal. There is no distension.     Palpations: Abdomen is soft.     Tenderness: There is no abdominal tenderness.  Musculoskeletal:        General: Swelling (RUE Edema) present. No deformity.  Skin:    General: Skin is warm and dry.  Neurological:     General: No focal deficit present.     Mental Status: Mental status is at baseline.   Labs on Admission: I have personally reviewed following labs and imaging studies  CBC: Recent Labs  Lab 09/03/21 1518  WBC 6.2  NEUTROABS 3.2  HGB 14.5  HCT 44.7  MCV 89.9  PLT 145*    Basic Metabolic Panel: Recent Labs  Lab 09/03/21 1518  NA 138  K 3.9  CL 105  CO2 24  GLUCOSE 99  BUN 6  CREATININE 0.79  CALCIUM 8.9    GFR: Estimated Creatinine Clearance: 91.1 mL/min (by C-G formula based on SCr of 0.79 mg/dL).  Liver Function Tests: No results for input(s): AST, ALT, ALKPHOS, BILITOT, PROT, ALBUMIN in the last 168 hours.  Urine analysis:    Component Value Date/Time   COLORURINE AMBER (A) 08/16/2021 2319  APPEARANCEUR HAZY (A) 08/16/2021 2319   LABSPEC 1.025 08/16/2021 2319   PHURINE 5.0 08/16/2021 2319   GLUCOSEU NEGATIVE 08/16/2021 2319   HGBUR MODERATE (A)  08/16/2021 2319   BILIRUBINUR NEGATIVE 08/16/2021 2319   KETONESUR 5 (A) 08/16/2021 2319   PROTEINUR 30 (A) 08/16/2021 2319   UROBILINOGEN 0.2 03/08/2011 2026   NITRITE NEGATIVE 08/16/2021 2319   LEUKOCYTESUR NEGATIVE 08/16/2021 2319    Radiological Exams on Admission: VAS US CAROTID  Result Date: 09/03/2021 Carotid Arterial Duplex Study Patient Name:  [name]  Date of Exam:   09/03/2021 Medical Rec #: [id number]      Accession #:    [id number] Date of Birth: 24-Mar-1966      Patient Gender: F Patient Age:   48 years Exam Location:  [facility] Procedure:      VAS US CAROTID Referring Phys: Charmaine Downs --------------------------------------------------------------------------------  Indications:       Carotid artery disease. Risk Factors:      Hypertension, hyperlipidemia, current smoker, prior MI,                    coronary artery disease. Comparison Study:  11/26/2020 carotid artery duplex performed at outside                    facility- Right ICA 60-79% stenosis, left ICA chronic                    occlusion. Performing Technologist: Maudry Mayhew MHA, RDMS, RVT, RDCS  Examination Guidelines: A complete evaluation includes B-mode imaging, spectral Doppler, color Doppler, and power Doppler as needed of all accessible portions of each vessel. Bilateral testing is considered an integral part of a complete examination. Limited examinations for reoccurring indications may be performed as noted.  Right Carotid Findings: +----------+--------+--------+--------+------------------+---------+           PSV cm/sEDV cm/sStenosisPlaque DescriptionComments  +----------+--------+--------+--------+------------------+---------+ CCA Prox  108     42                                          +----------+--------+--------+--------+------------------+---------+ CCA Distal96      37                                           +----------+--------+--------+--------+------------------+---------+ ICA Prox  263     122             calcific          Shadowing +----------+--------+--------+--------+------------------+---------+ ICA Mid   167     55                                          +----------+--------+--------+--------+------------------+---------+ ICA Distal118     62                                          +----------+--------+--------+--------+------------------+---------+ ECA       46      8               calcific  shadowing +----------+--------+--------+--------+------------------+---------+ +----------+--------+-------+----------------+-------------------+           PSV cm/sEDV cmsDescribe        Arm Pressure (mmHG) +----------+--------+-------+----------------+-------------------+ Subclavian132            Multiphasic, WNL                    +----------+--------+-------+----------------+-------------------+ +---------+--------+--+--------+--+---------+ VertebralPSV cm/s69EDV cm/s22Antegrade +---------+--------+--+--------+--+---------+  Left Carotid Findings: +----------+--------+--------+--------+-------------------------------+--------+           PSV cm/sEDV cm/sStenosisPlaque Description             Comments +----------+--------+--------+--------+-------------------------------+--------+ CCA Prox  111     10                                                      +----------+--------+--------+--------+-------------------------------+--------+ CCA Distal40      8               heterogenous, calcific and                                                focal                                   +----------+--------+--------+--------+-------------------------------+--------+ ICA Prox  22              Occludedheterogenous, hyperechoic and                                             irregular                                +----------+--------+--------+--------+-------------------------------+--------+ ICA Distal                Occluded                                        +----------+--------+--------+--------+-------------------------------+--------+ ECA       87      15              heterogenous and smooth                 +----------+--------+--------+--------+-------------------------------+--------+ +----------+--------+--------+----------------+-------------------+           PSV cm/sEDV cm/sDescribe        Arm Pressure (mmHG) +----------+--------+--------+----------------+-------------------+ [id number]             Multiphasic, WNL                    +----------+--------+--------+----------------+-------------------+ +---------+--------+--+--------+--+---------+ VertebralPSV cm/s50EDV cm/s22Antegrade +---------+--------+--+--------+--+---------+   Summary: Right Carotid: Velocities in the right ICA are consistent with a 80-99%                stenosis. Left Carotid: Evidence consistent with a total occlusion of the left ICA. Vertebrals:  Bilateral vertebral arteries demonstrate antegrade flow. Subclavians: Normal flow hemodynamics were seen in bilateral subclavian  arteries. *See table(s) above for measurements and observations.  Electronically signed by Deitra Mayo MD on 09/03/2021 at 6:31:18 PM.    Final    UE VENOUS DUPLEX (7am - 7pm)  Result Date: 09/03/2021 UPPER VENOUS STUDY  Patient Name:  [name]  Date of Exam:   09/03/2021 Medical Rec #: [id number]      Accession #:    [id number] Date of Birth: August 30, 1966      Patient Gender: F Patient Age:   45 years Exam Location:  [facility] Procedure:      VAS [place] UPPER EXTREMITY VENOUS DUPLEX Referring Phys: Charmaine Downs --------------------------------------------------------------------------------  Indications: Edema, and RUE PICC line Limitations: Bandages. Comparison Study: No prior study Performing  Technologist: Maudry Mayhew MHA, RDMS, RVT, RDCS  Examination Guidelines: A complete evaluation includes B-mode imaging, spectral Doppler, color Doppler, and power Doppler as needed of all accessible portions of each vessel. Bilateral testing is considered an integral part of a complete examination. Limited examinations for reoccurring indications may be performed as noted.  Right Findings: +----------+------------+---------+-----------+----------+-------+ RIGHT     CompressiblePhasicitySpontaneousPropertiesSummary +----------+------------+---------+-----------+----------+-------+ IJV           Full                 Yes                      +----------+------------+---------+-----------+----------+-------+ Subclavian    Full                 Yes                      +----------+------------+---------+-----------+----------+-------+ Axillary      None                 No                Acute  +----------+------------+---------+-----------+----------+-------+ Brachial      None                 No                Acute  +----------+------------+---------+-----------+----------+-------+ Radial        Full                                          +----------+------------+---------+-----------+----------+-------+ Ulnar         Full                                          +----------+------------+---------+-----------+----------+-------+ Cephalic      Full                                          +----------+------------+---------+-----------+----------+-------+ Basilic       None                                   Acute  +----------+------------+---------+-----------+----------+-------+  Left Findings: +----------+------------+---------+-----------+----------+-------+ LEFT      CompressiblePhasicitySpontaneousPropertiesSummary +----------+------------+---------+-----------+----------+-------+ Subclavian               Yes  Yes                       +----------+------------+---------+-----------+----------+-------+  Summary:  Right: Findings consistent with acute deep vein thrombosis involving the right axillary vein and right brachial veins and superficial vein thrombosis involving the right basilic vein, all surrounding the PICC line.  Left: No evidence of thrombosis in the subclavian.  *See table(s) above for measurements and observations.  Diagnosing physician: Deitra Mayo MD Electronically signed by Deitra Mayo MD on 09/03/2021 at 6:31:38 PM.    Final     EKG: Not performed in the ED.  Assessment/Plan Principal Problem:   DVT (deep venous thrombosis) (HCC) Active Problems:   Coronary artery disease   Mixed hyperlipidemia   Essential hypertension   GERD (gastroesophageal reflux disease)   Chronic diastolic HF (heart failure) (HCC)   Anxiety   Bacteremia due to Klebsiella pneumoniae  Right upper extremity DVT > Presented with difficulty flushing PICC line and adversary swelling.  Found to have DVT in the veins around the PICC line. > Case discussed with IR by phone by EDP who recommend heparinizing and once therapeutic plan to pull PICC line at that time and likely will need a tunneled access due to this DVT associated with the PICC line. - We will need formal IR consult in the morning - Heparin per pharmacy - We will begin medications through peripheral IVs while here  Klebsiella bacteremia > As per HPI admitted in November for this and discharged with the above PICC line on ceftriaxone and Flagyl through 12/17. > Typically does her dose in the evening at 6pm, last dose was 2 AM, will resume dose this evening. - Continue ceftriaxone and Flagyl   Carotid artery disease > Last evaluated in 2019 with near total occlusion of left carotid artery and 60s to 70s percent occlusion of right internal carotid artery.  Repeat study today showed total occlusion of left carotid artery and 80 to 99% occlusion of right  carotid artery. - Will benefit from vascular surgery consult while admitted. - Will be on heparin due to her right upper extremity DVT as above  CAD HLD Hypertension Diastolic CHF > Last echo 2836 with EF 55-60%.  > Heart cath in November 2019 with mild to moderate nonobstructing CAD.  And patent left circumflex stent. - Continue home aspirin, rosuvastatin - Hold antihypertensive as blood pressure is low normal in the ED  Asthma - Continue albuterol as needed   DVT prophylaxis: Heparin  Code Status:   Full  Family Communication:  Significant other updated at bedside Disposition Plan:   Patient is from:  Home  Anticipated DC to:  Home  Anticipated DC date:  1 to 2 days  Anticipated DC barriers: None  Consults called:  IR cysts spoke to EDP but not formally consulted.  Will need formal consult tomorrow.  We will also need vascular surgery consult considering progression of her carotid artery disease. Admission status:  Observation, telemetry  Severity of Illness: The appropriate patient status for this patient is OBSERVATION. Observation status is judged to be reasonable and necessary in order to provide the required intensity of service to ensure the patient's safety. The patient's presenting symptoms, physical exam findings, and initial radiographic and laboratory data in the context of their medical condition is felt to place them at decreased risk for further clinical deterioration. Furthermore, it is anticipated that the patient will be medically stable for discharge from the hospital within 2 midnights  of admission.   Marcelyn Bruins MD [facility]  How to contact the [facility] Attending or Consulting provider [place] or covering provider during after hours [place], for this patient?   Check the care team in [facility] and look for a) attending/consulting [facility] provider listed and b) the [facility] team listed Log into [email or web address] and use [place]'s universal password to access. If  you do not have the password, please contact the hospital operator. Locate the [facility] provider you are looking for under [facility] and page to a number that you can be directly reached. If you still have difficulty reaching the provider, please page the [facility] (Director on Call) for the Hospitalists listed on amion for assistance.  09/03/2021, 8:33 PM

## 2021-09-03 NOTE — Progress Notes (Signed)
ANTICOAGULATION CONSULT NOTE - Initial Consult  Pharmacy Consult for heparin Indication: DVT  Allergies  Allergen Reactions   Clomiphene Citrate Nausea And Vomiting   Morphine And Related Other (See Comments)    PATIENT REFUSES THIS MEDICATION: states that she does not tolerate this medication well   Sulfa Antibiotics Other (See Comments)    Not sure ? rash   Clomiphene Rash    Patient Measurements: Height: 5\' 4"  (162.6 cm) Weight: 104 kg (229 lb 4.5 oz) IBW/kg (Calculated) : 54.7  Vital Signs: Temp: 98.6 F (37 C) (12/06 1739) Temp Source: Temporal (12/06 1739) BP: 136/106 (12/06 1845) Pulse Rate: 65 (12/06 1739)  Labs: Recent Labs    09/03/21 1518  HGB 14.5  HCT 44.7  PLT 145*  CREATININE 0.79    Estimated Creatinine Clearance: 91.1 mL/min (by C-G formula based on SCr of 0.79 mg/dL).   Medical History: Past Medical History:  Diagnosis Date   Anxiety    Arthritis    oa needs hip replacement on right   Asthma    allergies    Cancer ([place])    squamous cell areas removed from vulva and pre caner areas removed from leg    Carotid artery occlusion    Complication of anesthesia    major depression after general anesthesia   Coronary artery disease    LHC 11/25/12 90% stenosis mid LCx & otherwise nonobstructive dz w/ EF 60-65% S/p PTCA/DES to LCx   Depression    Dyspnea    with exertion    Elevated cholesterol    Exertional dyspnea    chronic   Fibromyalgia    GERD (gastroesophageal reflux disease)    History of cardiovascular stress test 06/2015   low risk   HPV in female    Hyperlipidemia    Hypertension    MI (myocardial infarction) ([place]) fe 17, 2014   Obesity    Pneumonia    hx of x 2 years ago    Polycystic ovary disease    Skin tear of upper arm without complication, left, sequela    small skin tear left upper arm no drainage for 1 week   Sleep apnea    mild no cpap    Tobacco abuse     Medications:  (Not in a hospital admission)    Assessment: 57 YOF with acute RUE DVT near her chronic PICC line. Pharmacy consulted to start IV heparin. H/H wnl, Plt low. SCr wnl   Goal of Therapy:  Heparin level 0.3-0.7 units/ml Monitor platelets by anticoagulation protocol: Yes   Plan:  -Heparin 5000 units IV bolus followed by heparin infusion at 1750 units/hr -F/u 6 hr HL -Monitor daily HL, CBC and s/s of bleeding   Shannon [name], PharmD., BCPS, BCCCP Clinical Pharmacist Please refer to [facility] for unit-specific pharmacist

## 2021-09-03 NOTE — ED Notes (Signed)
D/c paperwork given to pt and pt verbalized understanding. Pt ambulatory to lobby. No further questions at this time.

## 2021-09-03 NOTE — ED Provider Notes (Signed)
Emergency Medicine Provider Triage Evaluation Note  Shannon [name] , a 55 y.o. female  was evaluated in triage.  Pt complains of RUE edema x1 day. Patient recently admitted to the hospital on 11/18-11/22 due to elevated troponins from atypical chest pain and Klebsiella pneumoniae bacteremia which was thought to be from 3 liver abscesses. PICC line placed on 11/22. Patient discharged with IV Rocephin and Flagyl which should be continued until 12/17. Patient was seen in the ED last night/early this morning due to inability to flush PICC line. PICC line flushed in the ED. She received her last dose of antibiotics around 2am. None today yet given they are every 24 hours. Denies fever and chills.   Review of Systems  Positive: Edema Negative: fever  Physical Exam  There were no vitals taken for this visit. Gen:   Awake, no distress   Resp:  Normal effort  MSK:   Moves extremities without difficulty  Other:  Edema to RUE extremity  Medical Decision Making  Medically screening exam initiated at 3:01 PM.  Appropriate orders placed.  Vennie A Blazier was informed that the remainder of the evaluation will be completed by another provider, this initial triage assessment does not replace that evaluation, and the importance of remaining in the ED until their evaluation is complete.  Edema from PICC line to RUE, infection vs. DVT. [place] and labs ordered.   Suzy Bouchard, PA-C 09/03/21 1511    Wyvonnia Dusky, MD 09/03/21 [phone]

## 2021-09-03 NOTE — ED Provider Notes (Signed)
Spoke to vascular, patient + for DVT in RUE. Patient requested US carotid given she normally gets them frequently and missed her last appointment.    Suzy Bouchard, PA-C 09/03/21 1636    Blanchie Dessert, MD 09/03/21 2138

## 2021-09-03 NOTE — ED Provider Notes (Signed)
[facility] EMERGENCY DEPARTMENT Provider Note   CSN: [id number] Arrival date & time: 09/03/21  1425     History No chief complaint on file.   Shannon [name] is a 55 y.o. female.  55 year old female with prior medical history as detailed below presents for evaluation.  Patient with right arm PICC in place.  This is required for treatment of liver abscess.  Patient is complaining of increased pain and swelling in the right arm.  Her last dose of antibiotics was given at home around 2 AM yesterday.  She denies other complaint.  She specifically denies chest pain or shortness of breath.  The history is provided by the patient.  Illness Location:  Right arm pain and swelling around PICC. Severity:  Moderate Onset quality:  Gradual Duration:  2 days Timing:  Constant Progression:  Worsening Chronicity:  New     Past Medical History:  Diagnosis Date   Anxiety    Arthritis    oa needs hip replacement on right   Asthma    allergies    Cancer (HCC)    squamous cell areas removed from vulva and pre caner areas removed from leg    Carotid artery occlusion    Complication of anesthesia    major depression after general anesthesia   Coronary artery disease    LHC 11/25/12 90% stenosis mid LCx & otherwise nonobstructive dz w/ EF 60-65% S/p PTCA/DES to LCx   Depression    Dyspnea    with exertion    Elevated cholesterol    Exertional dyspnea    chronic   Fibromyalgia    GERD (gastroesophageal reflux disease)    History of cardiovascular stress test 06/2015   low risk   HPV in female    Hyperlipidemia    Hypertension    MI (myocardial infarction) ([place]) fe 17, 2014   Obesity    Pneumonia    hx of x 2 years ago    Polycystic ovary disease    Skin tear of upper arm without complication, left, sequela    small skin tear left upper arm no drainage for 1 week   Sleep apnea    mild no cpap    Tobacco abuse     Patient Active Problem List   Diagnosis  Date Noted   Bacteremia due to Klebsiella pneumoniae 08/18/2021   Chest pain 08/18/2021   Atypical chest pain 08/17/2021   Hypotension 08/17/2021   Thrombocytopenia ([place]) 08/17/2021   Hyperglycemia 08/17/2021   Hyponatremia 08/17/2021   Hypokalemia 08/17/2021   Elevated troponin 08/17/2021   Obesity (BMI 30-39.9) 08/17/2021   Asthma 08/17/2021   Anxiety 08/17/2021   Primary osteoarthritis of right hip 01/16/2021   Chronic diastolic HF (heart failure) ([place]) 08/10/2018   GERD (gastroesophageal reflux disease) 08/09/2018   Unstable angina ([place]) 08/09/2018   Carotid artery disease ([place]) 07/24/2017   AAA (abdominal aortic aneurysm) without rupture 01/05/2013   Other malaise and fatigue 01/05/2013   Palpitations 01/05/2013   Tobacco use disorder 12/02/2012   Coronary artery disease    Mixed hyperlipidemia    Essential hypertension    SHORTNESS OF BREATH 09/04/2009   CHEST PAIN 09/04/2009    Past Surgical History:  Procedure Laterality Date   CORONARY ANGIOPLASTY WITH STENT PLACEMENT     LEFT HEART CATH AND CORONARY ANGIOGRAPHY N/A 08/09/2018   Procedure: LEFT HEART CATH AND CORONARY ANGIOGRAPHY;  Surgeon: Nelva Bush, MD;  Location: [facility];  Service: Cardiovascular;  Laterality: N/A;   LEFT HEART CATHETERIZATION WITH CORONARY ANGIOGRAM N/A 11/15/2012   Procedure: LEFT HEART CATHETERIZATION WITH CORONARY ANGIOGRAM;  Surgeon: Thayer Headings, MD;  Location: [facility] CATH LAB;  Service: Cardiovascular;  Laterality: N/A;   PERCUTANEOUS CORONARY STENT INTERVENTION (PCI-S)  11/15/2012   Procedure: PERCUTANEOUS CORONARY STENT INTERVENTION (PCI-S);  Surgeon: Thayer Headings, MD;  Location: [facility] CATH LAB;  Service: Cardiovascular;;   skin cancer removed right lower leg Right    TONSILLECTOMY     TOTAL HIP ARTHROPLASTY Right 01/16/2021   Procedure: TOTAL HIP ARTHROPLASTY ANTERIOR APPROACH;  Surgeon: Gaynelle Arabian, MD;  Location: [facility];  Service: Orthopedics;  Laterality: Right;   135mn   uterus ablation     VULVECTOMY N/A 11/08/2019   Procedure: excision of VULVA, vulvoscopy;  Surgeon: GDian Queen MD;  [place] [facility]  Service: Gynecology;  Laterality: N/A;     OB History   No obstetric history on file.     Family History  Problem Relation Age of Onset   Depression Mother    Anxiety disorder Maternal Grandmother    Anxiety disorder Paternal Grandmother    OCD Paternal Grandmother    Depression Paternal Grandmother    Heart attack Father        Deceased, 423  Cerebral aneurysm Father    Heart disease Father     Social History   Tobacco Use   Smoking status: Every Day    Packs/day: 2.00    Years: 40.00    Pack years: 80.00    Types: Cigarettes    Start date: 03/29/1979   Smokeless tobacco: Never   Tobacco comments:    Currently 2ppd  Vaping Use   Vaping Use: Never used  Substance Use Topics   Alcohol use: Not Currently    Comment: rare   Drug use: No    Home Medications Prior to Admission medications   Medication Sig Start Date End Date Taking? Authorizing Provider  albuterol (PROVENTIL HFA;VENTOLIN HFA) 108 (90 Base) MCG/ACT inhaler Inhale 2 puffs into the lungs every 4 (four) hours as needed for wheezing or shortness of breath. 12/21/15   Ward, KDelice Bison DO  ALPRAZolam (Duanne Moron 1 MG tablet Take 1 mg by mouth 2 (two) times daily as needed for anxiety. 06/12/17   [provider]  aspirin EC 81 MG tablet Take 81 mg by mouth daily. Swallow whole.    [provider]  bisoprolol-hydrochlorothiazide (ZIAC) 5-6.25 MG tablet Take 1 tablet by mouth daily. Pt takes in the pm 12/21/19   [provider]  cefTRIAXone (ROCEPHIN) IVPB Inject 2 g into the vein daily for 25 days. Indication:  Kleb PNA bacteremia + liver abscess First Dose: Yes Last Day of Therapy:  09/14/21 Labs - Once weekly:  CBC/D and BMP, Labs - Every other week:  ESR and CRP Method of administration: IV Push Method of administration  may be changed at the discretion of home infusion pharmacist based upon assessment of the patient and/or caregiver's ability to self-administer the medication ordered. 08/20/21 09/14/21  Pokhrel, LCorrie Mckusick MD  escitalopram (LEXAPRO) 20 MG tablet Take 20 mg by mouth daily. Takes in the pm    [provider]  fluticasone (FLONASE) 50 MCG/ACT nasal spray Place 2 sprays into both nostrils at bedtime.    [provider]  metroNIDAZOLE (FLAGYL) 500 MG tablet Take 1 tablet (500 mg total) by mouth 2 (two) times daily for 25 days. 08/20/21 09/14/21  PFlora Lipps MD  nicotine (NICODERM CQ - DOSED IN MG/24 HOURS) 21 mg/24hr patch Place 1 patch (21 mg total) onto the skin daily. 08/21/21   Pokhrel, Corrie Mckusick, MD  oxyCODONE (OXY IR/ROXICODONE) 5 MG immediate release tablet Take 1 tablet (5 mg total) by mouth every 6 (six) hours as needed for severe pain or moderate pain. 08/20/21   Pokhrel, Corrie Mckusick, MD  pantoprazole (PROTONIX) 40 MG tablet Take 40 mg by mouth daily.    [provider]  rosuvastatin (CRESTOR) 20 MG tablet Take 1 tablet (20 mg total) by mouth at bedtime. 08/10/18   Isaiah Serge, NP  sodium chloride flush (NS) 0.9 % SOLN 10-40 mLs by Intracatheter route every 12 (twelve) hours. 08/20/21   Pokhrel, Corrie Mckusick, MD  triamcinolone cream (KENALOG) 0.1 % Apply 1 application topically 2 (two) times daily. Patient taking differently: Apply 1 application topically 2 (two) times daily as needed (irritation). 01/09/20   Scot Jun, FNP    Allergies    Clomiphene citrate, Morphine and related, Sulfa antibiotics, and Clomiphene  Review of Systems   Review of Systems  All other systems reviewed and are negative.  Physical Exam Updated Vital Signs BP (!) 136/106   Pulse 65   Temp 98.6 F (37 C) (Temporal)   Resp 17   Ht _0  (1.626 m)   Wt 104 kg   SpO2 100%   BMI 39.36 kg/m   Physical Exam Vitals and nursing note reviewed.  Constitutional:      General: She is  not in acute distress.    Appearance: Normal appearance. She is well-developed.  HENT:     Head: Normocephalic and atraumatic.  Eyes:     Conjunctiva/sclera: Conjunctivae normal.     Pupils: Pupils are equal, round, and reactive to light.  Cardiovascular:     Rate and Rhythm: Normal rate and regular rhythm.     Heart sounds: Normal heart sounds.  Pulmonary:     Effort: Pulmonary effort is normal. No respiratory distress.     Breath sounds: Normal breath sounds.  Abdominal:     General: There is no distension.     Palpations: Abdomen is soft.     Tenderness: There is no abdominal tenderness.  Musculoskeletal:        General: No deformity. Normal range of motion.     Cervical back: Normal range of motion and neck supple.     Comments: PICC in place in right arm.  Mild diffuse edema of the right upper extremity noted.  Skin:    General: Skin is warm and dry.  Neurological:     General: No focal deficit present.     Mental Status: She is alert and oriented to person, place, and time.    ED Results / Procedures / Treatments   Labs (all labs ordered are listed, but only abnormal results are displayed) Labs Reviewed  CBC WITH DIFFERENTIAL/PLATELET - Abnormal; Notable for the following components:      Result Value   Platelets 145 (*)    All other components within normal limits  BASIC METABOLIC PANEL  MAGNESIUM    EKG None  Radiology VAS US CAROTID  Result Date: 09/03/2021 Carotid Arterial Duplex Study Patient Name:  [name]  Date of Exam:   09/03/2021 Medical Rec #: [id number]      Accession #:    [id number] Date of Birth: 01/25/1966      Patient Gender: F Patient Age:   53 years Exam [facility] Procedure:      VAS US CAROTID Referring Phys: Charmaine Downs --------------------------------------------------------------------------------  Indications:       Carotid artery disease. Risk Factors:      Hypertension, hyperlipidemia, current smoker, prior MI,                     coronary artery disease. Comparison Study:  11/26/2020 carotid artery duplex performed at outside                    facility- Right ICA 60-79% stenosis, left ICA chronic                    occlusion. Performing Technologist: Maudry Mayhew MHA, RDMS, RVT, RDCS  Examination Guidelines: A complete evaluation includes B-mode imaging, spectral Doppler, color Doppler, and power Doppler as needed of all accessible portions of each vessel. Bilateral testing is considered an integral part of a complete examination. Limited examinations for reoccurring indications may be performed as noted.  Right Carotid Findings: +----------+--------+--------+--------+------------------+---------+           PSV cm/sEDV cm/sStenosisPlaque DescriptionComments  +----------+--------+--------+--------+------------------+---------+ CCA Prox  108     42                                          +----------+--------+--------+--------+------------------+---------+ CCA Distal96      37                                          +----------+--------+--------+--------+------------------+---------+ ICA Prox  263     122             calcific          Shadowing +----------+--------+--------+--------+------------------+---------+ ICA Mid   167     55                                          +----------+--------+--------+--------+------------------+---------+ ICA Distal118     62                                          +----------+--------+--------+--------+------------------+---------+ ECA       46      8               calcific          shadowing +----------+--------+--------+--------+------------------+---------+ +----------+--------+-------+----------------+-------------------+           PSV cm/sEDV cmsDescribe        Arm Pressure (mmHG) +----------+--------+-------+----------------+-------------------+ Subclavian132            Multiphasic, WNL                     +----------+--------+-------+----------------+-------------------+ +---------+--------+--+--------+--+---------+ VertebralPSV cm/s69EDV cm/s22Antegrade +---------+--------+--+--------+--+---------+  Left Carotid Findings: +----------+--------+--------+--------+-------------------------------+--------+           PSV cm/sEDV cm/sStenosisPlaque Description             Comments +----------+--------+--------+--------+-------------------------------+--------+ CCA Prox  111     10                                                      +----------+--------+--------+--------+-------------------------------+--------+  CCA Distal40      8               heterogenous, calcific and                                                focal                                   +----------+--------+--------+--------+-------------------------------+--------+ ICA Prox  22              Occludedheterogenous, hyperechoic and                                             irregular                               +----------+--------+--------+--------+-------------------------------+--------+ ICA Distal                Occluded                                        +----------+--------+--------+--------+-------------------------------+--------+ ECA       87      15              heterogenous and smooth                 +----------+--------+--------+--------+-------------------------------+--------+ +----------+--------+--------+----------------+-------------------+           PSV cm/sEDV cm/sDescribe        Arm Pressure (mmHG) +----------+--------+--------+----------------+-------------------+ [id number]             Multiphasic, WNL                    +----------+--------+--------+----------------+-------------------+ +---------+--------+--+--------+--+---------+ VertebralPSV cm/s50EDV cm/s22Antegrade +---------+--------+--+--------+--+---------+   Summary: Right Carotid:  Velocities in the right ICA are consistent with a 80-99%                stenosis. Left Carotid: Evidence consistent with a total occlusion of the left ICA. Vertebrals:  Bilateral vertebral arteries demonstrate antegrade flow. Subclavians: Normal flow hemodynamics were seen in bilateral subclavian              arteries. *See table(s) above for measurements and observations.  Electronically signed by Deitra Mayo MD on 09/03/2021 at 6:31:18 PM.    Final    UE VENOUS DUPLEX (7am - 7pm)  Result Date: 09/03/2021 UPPER VENOUS STUDY  Patient Name:  [name]  Date of Exam:   09/03/2021 Medical Rec #: [id number]      Accession #:    [id number] Date of Birth: [birth year]      Patient Gender: F Patient Age:   59 years Exam Location:  [facility] Procedure:      VAS [place] UPPER EXTREMITY VENOUS DUPLEX Referring Phys: Charmaine Downs --------------------------------------------------------------------------------  Indications: Edema, and RUE PICC line Limitations: Bandages. Comparison Study: No prior study Performing Technologist: Maudry Mayhew MHA, RDMS, RVT, RDCS  Examination Guidelines: A complete evaluation includes B-mode imaging, spectral Doppler, color Doppler, and power Doppler as needed of all  accessible portions of each vessel. Bilateral testing is considered an integral part of a complete examination. Limited examinations for reoccurring indications may be performed as noted.  Right Findings: +----------+------------+---------+-----------+----------+-------+ RIGHT     CompressiblePhasicitySpontaneousPropertiesSummary +----------+------------+---------+-----------+----------+-------+ IJV           Full                 Yes                      +----------+------------+---------+-----------+----------+-------+ Subclavian    Full                 Yes                      +----------+------------+---------+-----------+----------+-------+ Axillary      None                 No                 Acute  +----------+------------+---------+-----------+----------+-------+ Brachial      None                 No                Acute  +----------+------------+---------+-----------+----------+-------+ Radial        Full                                          +----------+------------+---------+-----------+----------+-------+ Ulnar         Full                                          +----------+------------+---------+-----------+----------+-------+ Cephalic      Full                                          +----------+------------+---------+-----------+----------+-------+ Basilic       None                                   Acute  +----------+------------+---------+-----------+----------+-------+  Left Findings: +----------+------------+---------+-----------+----------+-------+ LEFT      CompressiblePhasicitySpontaneousPropertiesSummary +----------+------------+---------+-----------+----------+-------+ Subclavian               Yes       Yes                      +----------+------------+---------+-----------+----------+-------+  Summary:  Right: Findings consistent with acute deep vein thrombosis involving the right axillary vein and right brachial veins and superficial vein thrombosis involving the right basilic vein, all surrounding the PICC line.  Left: No evidence of thrombosis in the subclavian.  *See table(s) above for measurements and observations.  Diagnosing physician: Deitra Mayo MD Electronically signed by Deitra Mayo MD on 09/03/2021 at 6:31:38 PM.    Final     Procedures Procedures   Medications Ordered in ED Medications  HYDROmorphone (DILAUDID) injection 0.5 mg (0.5 mg Intravenous Given 09/03/21 1852)    ED Course  I have reviewed the triage vital signs and the nursing notes.  Pertinent labs & imaging results that were available during my care of the patient  were reviewed by me and considered in my medical  decision making (see chart for details).    MDM Rules/Calculators/A&P                           MDM  MSE complete  Dashawn A Morson was evaluated in Emergency Department on 09/03/2021 for the symptoms described in the history of present illness. She was evaluated in the context of the global COVID-19 pandemic, which necessitated consideration that the patient might be at risk for infection with the SARS-CoV-2 virus that causes COVID-19. Institutional protocols and algorithms that pertain to the evaluation of patients at risk for COVID-19 are in a state of rapid change based on information released by regulatory bodies including the CDC and federal and state organizations. These policies and algorithms were followed during the patient's care in the ED.  Patient is presenting with complaint of pain to the right arm with PICC line in place.  Ultrasound imaging demonstrates evidence of DVT in the right arm surrounding the PICC line.  Case discussed briefly with Dr. Dwaine Gale of IR.  He recommends heparinization.  Right arm PICC will need to be pulled once patient is therapeutic on heparin.  Patient will require non-PICC line access for continued course of antibiotics.  Patient will require admission.  Hospitalist service is aware of case and will evaluate for same.   Final Clinical Impression(s) / ED Diagnoses Final diagnoses:  Acute deep vein thrombosis (DVT) of right upper extremity, unspecified vein ([place])    Rx / DC Orders ED Discharge Orders     None        Valarie Merino, MD 09/03/21 [phone]

## 2021-09-03 NOTE — ED Triage Notes (Signed)
Patient complains of PICC backing up with bleed and arm swelling since last  night/ early am. Patient did infuse am antibiotics. Patient in no distress

## 2021-09-03 NOTE — Progress Notes (Addendum)
Right upper extremity venous duplex and carotid artery duplex completed. Refer to "CV Proc" under chart review to view preliminary results.  Preliminary results discussed with Charmaine Downs, PA-C.  09/03/2021 4:22 PM Kelby Aline., MHA, RVT, RDCS, RDMS

## 2021-09-04 ENCOUNTER — Encounter (HOSPITAL_COMMUNITY): Payer: Self-pay | Admitting: Internal Medicine

## 2021-09-04 DIAGNOSIS — R7881 Bacteremia: Secondary | ICD-10-CM | POA: Diagnosis not present

## 2021-09-04 DIAGNOSIS — Z7982 Long term (current) use of aspirin: Secondary | ICD-10-CM | POA: Diagnosis not present

## 2021-09-04 DIAGNOSIS — B961 Klebsiella pneumoniae [K. pneumoniae] as the cause of diseases classified elsewhere: Secondary | ICD-10-CM | POA: Diagnosis not present

## 2021-09-04 DIAGNOSIS — Z79899 Other long term (current) drug therapy: Secondary | ICD-10-CM | POA: Diagnosis not present

## 2021-09-04 DIAGNOSIS — I6523 Occlusion and stenosis of bilateral carotid arteries: Secondary | ICD-10-CM | POA: Diagnosis not present

## 2021-09-04 DIAGNOSIS — F17210 Nicotine dependence, cigarettes, uncomplicated: Secondary | ICD-10-CM | POA: Diagnosis not present

## 2021-09-04 DIAGNOSIS — I5032 Chronic diastolic (congestive) heart failure: Secondary | ICD-10-CM | POA: Diagnosis not present

## 2021-09-04 DIAGNOSIS — I82621 Acute embolism and thrombosis of deep veins of right upper extremity: Secondary | ICD-10-CM | POA: Diagnosis not present

## 2021-09-04 DIAGNOSIS — I25118 Atherosclerotic heart disease of native coronary artery with other forms of angina pectoris: Secondary | ICD-10-CM | POA: Diagnosis not present

## 2021-09-04 LAB — CBC
HCT: 41.3 % (ref 36.0–46.0)
Hemoglobin: 13.1 g/dL (ref 12.0–15.0)
MCH: 28.7 pg (ref 26.0–34.0)
MCHC: 31.7 g/dL (ref 30.0–36.0)
MCV: 90.4 fL (ref 80.0–100.0)
Platelets: 112 10*3/uL — ABNORMAL LOW (ref 150–400)
RBC: 4.57 MIL/uL (ref 3.87–5.11)
RDW: 14.3 % (ref 11.5–15.5)
WBC: 5 10*3/uL (ref 4.0–10.5)
nRBC: 0 % (ref 0.0–0.2)

## 2021-09-04 LAB — HEPARIN LEVEL (UNFRACTIONATED)
Heparin Unfractionated: 0.78 IU/mL — ABNORMAL HIGH (ref 0.30–0.70)
Heparin Unfractionated: 0.71 IU/mL — ABNORMAL HIGH (ref 0.30–0.70)
Heparin Unfractionated: 0.78 IU/mL — ABNORMAL HIGH (ref 0.30–0.70)

## 2021-09-04 LAB — BASIC METABOLIC PANEL
Anion gap: 11 (ref 5–15)
BUN: 9 mg/dL (ref 6–20)
CO2: 23 mmol/L (ref 22–32)
Calcium: 8.5 mg/dL — ABNORMAL LOW (ref 8.9–10.3)
Chloride: 102 mmol/L (ref 98–111)
Creatinine, Ser: 0.88 mg/dL (ref 0.44–1.00)
GFR, Estimated: >60 mL/min (ref >60)
Glucose, Bld: 101 mg/dL — ABNORMAL HIGH (ref 70–99)
Potassium: 3.8 mmol/L (ref 3.5–5.1)
Sodium: 136 mmol/L (ref 135–145)

## 2021-09-04 MED ORDER — ESCITALOPRAM OXALATE 20 MG PO TABS
20.0000 mg | ORAL_TABLET | Freq: Every day | ORAL | Status: DC
  Administered 2021-09-04: 20 mg via ORAL
  Filled 2021-09-04: qty 1

## 2021-09-04 MED ORDER — ALPRAZOLAM 0.25 MG PO TABS
0.5000 mg | ORAL_TABLET | Freq: Once | ORAL | Status: AC
  Administered 2021-09-04: 0.5 mg via ORAL
  Filled 2021-09-04: qty 2

## 2021-09-04 MED ORDER — ALPRAZOLAM 0.5 MG PO TABS
1.0000 mg | ORAL_TABLET | Freq: Two times a day (BID) | ORAL | Status: DC | PRN
  Administered 2021-09-04: 1 mg via ORAL
  Filled 2021-09-04: qty 2

## 2021-09-04 MED ORDER — OXYCODONE HCL 5 MG PO TABS
5.0000 mg | ORAL_TABLET | Freq: Four times a day (QID) | ORAL | Status: DC | PRN
  Administered 2021-09-04 (×2): 5 mg via ORAL
  Filled 2021-09-04 (×2): qty 1

## 2021-09-04 MED ORDER — BISOPROLOL-HYDROCHLOROTHIAZIDE 5-6.25 MG PO TABS
1.0000 | ORAL_TABLET | Freq: Every day | ORAL | Status: DC
  Administered 2021-09-04 – 2021-09-05 (×2): 1 via ORAL
  Filled 2021-09-04 (×3): qty 1

## 2021-09-04 NOTE — Progress Notes (Signed)
ANTICOAGULATION CONSULT NOTE  Pharmacy Consult for heparin Indication: DVT  Allergies  Allergen Reactions   Clomiphene Citrate Nausea And Vomiting   Morphine And Related Other (See Comments)    PATIENT REFUSES THIS MEDICATION: states that she does not tolerate this medication well   Sulfa Antibiotics Other (See Comments)    Not sure ? rash   Clomiphene Rash    Patient Measurements: Height: 5\' 4"  (162.6 cm) Weight: 104 kg (229 lb 4.5 oz) IBW/kg (Calculated) : 54.7  Vital Signs: Temp: 98.6 F (37 C) (12/06 1739) Temp Source: Temporal (12/06 1739) BP: 121/84 (12/07 0330) Pulse Rate: 59 (12/07 0330)  Labs: Recent Labs    09/03/21 1518 09/04/21 0300  HGB 14.5  --   HCT 44.7  --   PLT 145*  --   HEPARINUNFRC  --  0.78*  CREATININE 0.79  --      Estimated Creatinine Clearance: 91.1 mL/min (by C-G formula based on SCr of 0.79 mg/dL).   Medical History: Past Medical History:  Diagnosis Date   Anxiety    Arthritis    oa needs hip replacement on right   Asthma    allergies    Cancer ([place])    squamous cell areas removed from vulva and pre caner areas removed from leg    Carotid artery occlusion    Complication of anesthesia    major depression after general anesthesia   Coronary artery disease    LHC 11/25/12 90% stenosis mid LCx & otherwise nonobstructive dz w/ EF 60-65% S/p PTCA/DES to LCx   Depression    Dyspnea    with exertion    Elevated cholesterol    Exertional dyspnea    chronic   Fibromyalgia    GERD (gastroesophageal reflux disease)    History of cardiovascular stress test 06/2015   low risk   HPV in female    Hyperlipidemia    Hypertension    MI (myocardial infarction) ([place]) fe 17, 2014   Obesity    Pneumonia    hx of x 2 years ago    Polycystic ovary disease    Skin tear of upper arm without complication, left, sequela    small skin tear left upper arm no drainage for 1 week   Sleep apnea    mild no cpap    Tobacco abuse      Medications:  (Not in a hospital admission)  Assessment: 70 YOF with acute RUE DVT near her chronic PICC line. Pharmacy consulted to start IV heparin. H/H wnl, Plt low. SCr wnl   12/7 AM update:  Heparin level elevated  Goal of Therapy:  Heparin level 0.3-0.7 units/ml Monitor platelets by anticoagulation protocol: Yes   Plan:  -Dec heparin to 1550 units/hr -1300 heparin level  Narda Bonds, PharmD, BCPS Clinical Pharmacist Phone: [phone]

## 2021-09-04 NOTE — ED Provider Notes (Signed)
[facility] EMERGENCY DEPARTMENT Provider Note   CSN: [id number] Arrival date & time: 09/02/21  2003     History Chief Complaint  Patient presents with   PICC problem    Shannon [name] is a 55 y.o. female.  55 yo F presents to ED with difficulty flushing her PICC line. In place for home abx 2/2 liver abscess. Had the external catheter replaced today. When she went to use it this afternoon, it did not flush. Called, sent here for evaluation. No edema. No fever. No other complaints at this time.        Past Medical History:  Diagnosis Date   Anxiety    Arthritis    oa needs hip replacement on right   Asthma    allergies    Cancer ([place])    squamous cell areas removed from vulva and pre caner areas removed from leg    Carotid artery occlusion    Complication of anesthesia    major depression after general anesthesia   Coronary artery disease    LHC 11/25/12 90% stenosis mid LCx & otherwise nonobstructive dz w/ EF 60-65% S/p PTCA/DES to LCx   Depression    Dyspnea    with exertion    Elevated cholesterol    Exertional dyspnea    chronic   Fibromyalgia    GERD (gastroesophageal reflux disease)    History of cardiovascular stress test 06/2015   low risk   HPV in female    Hyperlipidemia    Hypertension    MI (myocardial infarction) ([place]) fe 17, 2014   Obesity    Pneumonia    hx of x 2 years ago    Polycystic ovary disease    Skin tear of upper arm without complication, left, sequela    small skin tear left upper arm no drainage for 1 week   Sleep apnea    mild no cpap    Tobacco abuse     Patient Active Problem List   Diagnosis Date Noted   DVT (deep venous thrombosis) ([place]) 09/03/2021   Bacteremia due to Klebsiella pneumoniae 08/18/2021   Chest pain 08/18/2021   Atypical chest pain 08/17/2021   Hypotension 08/17/2021   Thrombocytopenia ([place]) 08/17/2021   Hyperglycemia 08/17/2021   Hyponatremia 08/17/2021   Hypokalemia 08/17/2021   Elevated troponin  08/17/2021   Obesity (BMI 30-39.9) 08/17/2021   Asthma 08/17/2021   Anxiety 08/17/2021   Primary osteoarthritis of right hip 01/16/2021   Chronic diastolic HF (heart failure) ([place]) 08/10/2018   GERD (gastroesophageal reflux disease) 08/09/2018   Unstable angina ([place]) 08/09/2018   Carotid artery disease ([place]) 07/24/2017   AAA (abdominal aortic aneurysm) without rupture 01/05/2013   Other malaise and fatigue 01/05/2013   Palpitations 01/05/2013   Tobacco use disorder 12/02/2012   Coronary artery disease    Mixed hyperlipidemia    Essential hypertension    SHORTNESS OF BREATH 09/04/2009   CHEST PAIN 09/04/2009    Past Surgical History:  Procedure Laterality Date   CORONARY ANGIOPLASTY WITH STENT PLACEMENT     LEFT HEART CATH AND CORONARY ANGIOGRAPHY N/A 08/09/2018   Procedure: LEFT HEART CATH AND CORONARY ANGIOGRAPHY;  Surgeon: Nelva Bush, MD;  Location: [place] CV LAB;  Service: Cardiovascular;  Laterality: N/A;   LEFT HEART CATHETERIZATION WITH CORONARY ANGIOGRAM N/A 11/15/2012   Procedure: LEFT HEART CATHETERIZATION WITH CORONARY ANGIOGRAM;  Surgeon: Thayer Headings, MD;  Location: [facility] CATH LAB;  Service: Cardiovascular;  Laterality: N/A;   PERCUTANEOUS CORONARY  STENT INTERVENTION (PCI-S)  11/15/2012   Procedure: PERCUTANEOUS CORONARY STENT INTERVENTION (PCI-S);  Surgeon: Thayer Headings, MD;  Location: [facility] CATH LAB;  Service: Cardiovascular;;   skin cancer removed right lower leg Right    TONSILLECTOMY     TOTAL HIP ARTHROPLASTY Right 01/16/2021   Procedure: TOTAL HIP ARTHROPLASTY ANTERIOR APPROACH;  Surgeon: Gaynelle Arabian, MD;  [facility]: [facility];  Service: Orthopedics;  Laterality: Right;  176mn   uterus ablation     VULVECTOMY N/A 11/08/2019   Procedure: excision of VULVA, vulvoscopy;  Surgeon: GDian Queen MD;  [place]  Service: Gynecology;  Laterality: N/A;     OB History   No obstetric history on file.     Family History   Problem Relation Age of Onset   Depression Mother    Anxiety disorder Maternal Grandmother    Anxiety disorder Paternal Grandmother    OCD Paternal Grandmother    Depression Paternal Grandmother    Heart attack Father        Deceased, 432  Cerebral aneurysm Father    Heart disease Father     Social History   Tobacco Use   Smoking status: Every Day    Packs/day: 2.00    Years: 40.00    Pack years: 80.00    Types: Cigarettes    Start date: 03/29/1979   Smokeless tobacco: Never   Tobacco comments:    Currently 2ppd  Vaping Use   Vaping Use: Never used  Substance Use Topics   Alcohol use: Not Currently    Comment: rare   Drug use: No    Home Medications Prior to Admission medications   Medication Sig Start Date End Date Taking? Authorizing Provider  albuterol (PROVENTIL HFA;VENTOLIN HFA) 108 (90 Base) MCG/ACT inhaler Inhale 2 puffs into the lungs every 4 (four) hours as needed for wheezing or shortness of breath. 12/21/15   Ward, KDelice Bison DO  ALPRAZolam (Duanne Moron 1 MG tablet Take 1 mg by mouth 2 (two) times daily as needed for anxiety. 06/12/17   [provider]  aspirin EC 81 MG tablet Take 81 mg by mouth daily. Swallow whole.    [provider]  bisoprolol-hydrochlorothiazide (ZIAC) 5-6.25 MG tablet Take 1 tablet by mouth daily. Pt takes in the pm 12/21/19   [provider]  cefTRIAXone (ROCEPHIN) IVPB Inject 2 g into the vein daily for 25 days. Indication:  Kleb PNA bacteremia + liver abscess First Dose: Yes Last Day of Therapy:  09/14/21 Labs - Once weekly:  CBC/D and BMP, Labs - Every other week:  ESR and CRP Method of administration: IV Push Method of administration may be changed at the discretion of home infusion pharmacist based upon assessment of the patient and/or caregiver's ability to self-administer the medication ordered. 08/20/21 09/14/21  Pokhrel, LCorrie Mckusick MD  escitalopram (LEXAPRO) 20 MG tablet Take 20 mg by mouth at bedtime. Takes  in the pm    [provider]  fluticasone (FLONASE) 50 MCG/ACT nasal spray Place 2 sprays into both nostrils at bedtime.    [provider]  metroNIDAZOLE (FLAGYL) 500 MG tablet Take 1 tablet (500 mg total) by mouth 2 (two) times daily for 25 days. 08/20/21 09/14/21  Pokhrel, LCorrie Mckusick MD  nicotine (NICODERM CQ - DOSED IN MG/24 HOURS) 21 mg/24hr patch Place 1 patch (21 mg total) onto the skin daily. Patient not taking: Reported on 09/03/2021 08/21/21   PFlora Lipps MD  nitroGLYCERIN (NITROSTAT) 0.4 MG SL tablet Place  0.4 mg under the tongue every 5 (five) minutes as needed for chest pain.    [provider]  oxyCODONE (OXY IR/ROXICODONE) 5 MG immediate release tablet Take 1 tablet (5 mg total) by mouth every 6 (six) hours as needed for severe pain or moderate pain. 08/20/21   Pokhrel, Corrie Mckusick, MD  pantoprazole (PROTONIX) 40 MG tablet Take 40 mg by mouth daily.    [provider]  rosuvastatin (CRESTOR) 10 MG tablet Take 10 mg by mouth at bedtime.    [provider]  rosuvastatin (CRESTOR) 20 MG tablet Take 1 tablet (20 mg total) by mouth at bedtime. Patient not taking: Reported on 09/03/2021 08/10/18   Isaiah Serge, NP  sodium chloride flush (NS) 0.9 % SOLN 10-40 mLs by Intracatheter route every 12 (twelve) hours. 08/20/21   Pokhrel, Corrie Mckusick, MD  triamcinolone cream (KENALOG) 0.1 % Apply 1 application topically 2 (two) times daily. Patient taking differently: Apply 1 application topically 2 (two) times daily as needed (irritation). 01/09/20   Scot Jun, FNP    Allergies    Clomiphene citrate, Morphine and related, Sulfa antibiotics, and Clomiphene  Review of Systems   Review of Systems  All other systems reviewed and are negative.  Physical Exam Updated Vital Signs BP 109/68   Pulse 70   Temp 98.2 F (36.8 C) (Oral)   Resp 18   SpO2 100%   Physical Exam Vitals and nursing note reviewed.  Constitutional:      Appearance: She is  well-developed.  HENT:     Head: Normocephalic and atraumatic.     Mouth/Throat:     Mouth: Mucous membranes are moist.     Pharynx: Oropharynx is clear.  Eyes:     Pupils: Pupils are equal, round, and reactive to light.  Cardiovascular:     Rate and Rhythm: Normal rate and regular rhythm.  Pulmonary:     Effort: No respiratory distress.     Breath sounds: No stridor.  Abdominal:     General: Abdomen is flat. There is no distension.  Musculoskeletal:        General: No swelling, tenderness or deformity. Normal range of motion.     Cervical back: Normal range of motion.  Skin:    General: Skin is warm and dry.     Comments: PICC in place in [place]. Dark blood in catheter. Clamped.   Neurological:     General: No focal deficit present.     Mental Status: She is alert.    ED Results / Procedures / Treatments   Labs (all labs ordered are listed, but only abnormal results are displayed) Labs Reviewed - No data to display  EKG None  Radiology   Procedures Procedures   Medications Ordered in ED Medications - No data to display  ED Course  I have reviewed the triage vital signs and the nursing notes.  Pertinent labs & imaging results that were available during my care of the patient were reviewed by me and considered in my medical decision making (see chart for details).    MDM Rules/Calculators/A&P                         Attempted to draw back off catheter initially without success.  Attempted to flush, initial resistance but flushed relatively easily. Patient flushed and felt it was similar to what she was used to. Will administer abx when she gets home.   Final Clinical Impression(s) /  ED Diagnoses Final diagnoses:  PICC (peripherally inserted central catheter) flush    Rx / DC Orders ED Discharge Orders     None        Gust Eugene, Corene Cornea, MD 09/04/21 0101

## 2021-09-04 NOTE — Progress Notes (Addendum)
Hospital Consult    Reason for Consult:  right carotid artery stenosis Requesting Physician:  Trilby Drummer MRN #:  [id number]  History of Present Illness: This is a 55 y.o. female who hs hx of sepsis due to liver abscess.  She had a PICC line placed for home IV abx and is followed by ID.  She states that on Monday she was unable to pullback and flush the PICC. She had gone to the ED on Monday.  She states that in the early hours of Tuesday, she noticed swelling in the right arm.  She presented to the ER and was found to have a DVT in the RUE.  She has been started on heparin.    She was seen in our office in the remote past for carotid artery stenosis.  She states she was not happy with the provider and started being seen by Dr. Rosana Hoes in [facility].  She tells me that she was seen in February by their PA and the right was in the 60-79% range and appointment for 6 month f/u was made.  While here in the ED, she requested to have carotid duplex since she did not make her f/u appt there and this revealed 80-99% right ICA stenosis.  She has a known left ICA occlusion.  She denies any amaurosis fugax, speech problems or unilateral weakness, numbness or paralysis.  She states that she has had increasing times where she is looking for something and doesn't see it and looks back and it is there.  She states that she has some weakness in the RLE but this started earlier this year after she had a right hip replacement.  This has been unchanged since then.  She denies any claudication symptoms or non healing wounds.  She denies any family hx of AAA.  She has been compliant with her statin and asa.  She continues to smoke.    She states that her mother is dealing with stage IV pancreatic cancer and does not want any interventions until after Christmas.    She stated she was feeling quite anxious discussing her carotid disease and smoking.   The pt is  on a statin for cholesterol management.  The pt  is on a daily aspirin.   Other AC:  currently heparin gtt The pt is on BB/diuretic for hypertension.   The pt is not diabetic.   Tobacco hx:  current  Past Medical History:  Diagnosis Date   Anxiety    Arthritis    oa needs hip replacement on right   Asthma    allergies    Cancer ([place])    squamous cell areas removed from vulva and pre caner areas removed from leg    Carotid artery occlusion    Complication of anesthesia    major depression after general anesthesia   Coronary artery disease    LHC 11/25/12 90% stenosis mid LCx & otherwise nonobstructive dz w/ EF 60-65% S/p PTCA/DES to LCx   Depression    Dyspnea    with exertion    Elevated cholesterol    Exertional dyspnea    chronic   Fibromyalgia    GERD (gastroesophageal reflux disease)    History of cardiovascular stress test 06/2015   low risk   HPV in female    Hyperlipidemia    Hypertension    MI (myocardial infarction) ([place]) fe 17, 2014   Obesity    Pneumonia    hx of x 2  years ago    Polycystic ovary disease    Skin tear of upper arm without complication, left, sequela    small skin tear left upper arm no drainage for 1 week   Sleep apnea    mild no cpap    Tobacco abuse     Past Surgical History:  Procedure Laterality Date   CORONARY ANGIOPLASTY WITH STENT PLACEMENT     LEFT HEART CATH AND CORONARY ANGIOGRAPHY N/A 08/09/2018   Procedure: LEFT HEART CATH AND CORONARY ANGIOGRAPHY;  Surgeon: Nelva Bush, MD;  Location: [place] CV LAB;  Service: Cardiovascular;  Laterality: N/A;   LEFT HEART CATHETERIZATION WITH CORONARY ANGIOGRAM N/A 11/15/2012   Procedure: LEFT HEART CATHETERIZATION WITH CORONARY ANGIOGRAM;  Surgeon: Thayer Headings, MD;  Location: [facility] CATH LAB;  Service: Cardiovascular;  Laterality: N/A;   PERCUTANEOUS CORONARY STENT INTERVENTION (PCI-S)  11/15/2012   Procedure: PERCUTANEOUS CORONARY STENT INTERVENTION (PCI-S);  Surgeon: Thayer Headings, MD;  Location: [facility] CATH LAB;  Service:  Cardiovascular;;   skin cancer removed right lower leg Right    TONSILLECTOMY     TOTAL HIP ARTHROPLASTY Right 01/16/2021   Procedure: TOTAL HIP ARTHROPLASTY ANTERIOR APPROACH;  Surgeon: Gaynelle Arabian, MD;  Location: [facility];  Service: Orthopedics;  Laterality: Right;  148min   uterus ablation     VULVECTOMY N/A 11/08/2019   Procedure: excision of VULVA, vulvoscopy;  Surgeon: Dian Queen, MD;  Location: [place];  Service: Gynecology;  Laterality: N/A;    Allergies  Allergen Reactions   Clomiphene Citrate Nausea And Vomiting   Morphine And Related Other (See Comments)    PATIENT REFUSES THIS MEDICATION: states that she does not tolerate this medication well   Sulfa Antibiotics Other (See Comments)    Not sure ? rash   Clomiphene Rash    Prior to Admission medications   Medication Sig Start Date End Date Taking? Authorizing Provider  albuterol (PROVENTIL HFA;VENTOLIN HFA) 108 (90 Base) MCG/ACT inhaler Inhale 2 puffs into the lungs every 4 (four) hours as needed for wheezing or shortness of breath. 12/21/15  Yes Ward, Delice Bison, DO  ALPRAZolam (XANAX) 1 MG tablet Take 1 mg by mouth 2 (two) times daily as needed for anxiety. 06/12/17  Yes [provider]  aspirin EC 81 MG tablet Take 81 mg by mouth daily. Swallow whole.   Yes [provider]  bisoprolol-hydrochlorothiazide (ZIAC) 5-6.25 MG tablet Take 1 tablet by mouth daily. Pt takes in the pm 12/21/19  Yes [provider]  cefTRIAXone (ROCEPHIN) IVPB Inject 2 g into the vein daily for 25 days. Indication:  Kleb PNA bacteremia + liver abscess First Dose: Yes Last Day of Therapy:  09/14/21 Labs - Once weekly:  CBC/D and BMP, Labs - Every other week:  ESR and CRP Method of administration: IV Push Method of administration may be changed at the discretion of home infusion pharmacist based upon assessment of the patient and/or caregiver's ability to self-administer the medication ordered. 08/20/21  09/14/21 Yes Pokhrel, Laxman, MD  escitalopram (LEXAPRO) 20 MG tablet Take 20 mg by mouth at bedtime. Takes in the pm   Yes [provider]  fluticasone (FLONASE) 50 MCG/ACT nasal spray Place 2 sprays into both nostrils at bedtime.   Yes [provider]  metroNIDAZOLE (FLAGYL) 500 MG tablet Take 1 tablet (500 mg total) by mouth 2 (two) times daily for 25 days. 08/20/21 09/14/21 Yes Pokhrel, Laxman, MD  nitroGLYCERIN (NITROSTAT) 0.4 MG SL tablet Place 0.4 mg  under the tongue every 5 (five) minutes as needed for chest pain.   Yes [provider]  oxyCODONE (OXY IR/ROXICODONE) 5 MG immediate release tablet Take 1 tablet (5 mg total) by mouth every 6 (six) hours as needed for severe pain or moderate pain. 08/20/21  Yes Pokhrel, Laxman, MD  pantoprazole (PROTONIX) 40 MG tablet Take 40 mg by mouth daily.   Yes [provider]  rosuvastatin (CRESTOR) 10 MG tablet Take 10 mg by mouth at bedtime.   Yes [provider]  sodium chloride flush (NS) 0.9 % SOLN 10-40 mLs by Intracatheter route every 12 (twelve) hours. 08/20/21  Yes Pokhrel, Laxman, MD  triamcinolone cream (KENALOG) 0.1 % Apply 1 application topically 2 (two) times daily. Patient taking differently: Apply 1 application topically 2 (two) times daily as needed (irritation). 01/09/20  Yes Scot Jun, FNP  nicotine (NICODERM CQ - DOSED IN MG/24 HOURS) 21 mg/24hr patch Place 1 patch (21 mg total) onto the skin daily. Patient not taking: Reported on 09/03/2021 08/21/21   Flora Lipps, MD  rosuvastatin (CRESTOR) 20 MG tablet Take 1 tablet (20 mg total) by mouth at bedtime. Patient not taking: Reported on 09/03/2021 08/10/18   Isaiah Serge, NP    Social History   Socioeconomic History   Marital status: Legally Separated    Spouse name: Not on file   Number of children: Not on file   Years of education: Not on file   Highest education level: Not on file  Occupational History   Not on file   Tobacco Use   Smoking status: Every Day    Packs/day: 2.00    Years: 40.00    Pack years: 80.00    Types: Cigarettes    Start date: 03/29/1979   Smokeless tobacco: Never   Tobacco comments:    Currently 2ppd  Vaping Use   Vaping Use: Never used  Substance and Sexual Activity   Alcohol use: Not Currently    Comment: rare   Drug use: No   Sexual activity: Not Currently    Partners: Male  Other Topics Concern   Not on file  Social History Narrative   Lives with husband in a 3 story home.  Has no children.     On disability.     Education: high school, some college.   Social Determinants of Health   Financial Resource Strain: Not on file  Food Insecurity: Not on file  Transportation Needs: Not on file  Physical Activity: Not on file  Stress: Not on file  Social Connections: Not on file  Intimate Partner Violence: Not on file     Family History  Problem Relation Age of Onset   Depression Mother    Anxiety disorder Maternal Grandmother    Anxiety disorder Paternal Grandmother    OCD Paternal Grandmother    Depression Paternal Grandmother    Heart attack Father        Deceased, 46   Cerebral aneurysm Father    Heart disease Father     ROS: [x]  Positive   [ ]  Negative   [ ]  All sytems reviewed and are negative  Cardiac: []  chest pain/pressure []  palpitations []  SOB lying flat []  DOE  Vascular: []  pain in legs while walking []  pain in legs at rest []  pain in legs at night []  non-healing ulcers []  hx of DVT []  swelling in legs  Pulmonary: []  asthma/wheezing []  home O2  Neurologic: []  hx of CVA []  mini stroke  Hematologic: '[]'$  hx of cancer  Endocrine:   '[]'$  diabetes $RemoveB'[]'DBAdwiHN$  thyroid disease  GI $R'[]'bX$  GERD  GU: $Re'[]'Cvj$  CKD/renal failure $RemoveBeforeDEI'[]'OyWtmxmkZtIrNPUg$  HD'[]'$  M/W/F or $Remove'[]'ybEalQw$  T/T/S  Psychiatric: $RemoveBefor'[]'pSMGzSxtXXKG$  anxiety $Remove'[]'uMiDsLY$  depression  Musculoskeletal: $RemoveBeforeDEI'[]'DQpWwvtZzcpeXrMn$  arthritis $RemoveBe'[]'JZgSWbGwY$  joint pain  Integumentary: $RemoveBeforeD'[]'wkCrCFGHQuammy$  rashes $Remov'[]'aXQIAu$  ulcers  Constitutional: $RemoveBeforeDE'[]'LWUcchzyuhZXRaU$  fever  $Remo'[]'OLche$  chills  Physical  Examination  Vitals:   09/04/21 0830 09/04/21 0900  BP: 124/79 128/71  Pulse: 83 61  Resp: 16 10  Temp:    SpO2: 96% 100%   Body mass index is 39.36 kg/m.  General:  WDWN in NAD Gait: Not observed HENT: WNL, normocephalic Pulmonary: normal non-labored breathing Cardiac: regular, without Murmur; without carotid bruits Abdomen:  soft, NT/ND; aortic pulse is not palpable Skin: without rashes Vascular Exam/Pulses:  Right Left  Radial 2+ (normal) 2+ (normal)  AT 1+ (weak) 2+ (normal)   Extremities: without ischemic changes, without Gangrene , without cellulitis; without open wounds Musculoskeletal: no muscle wasting or atrophy  Neurologic: A&O X 3; speech is fluent/normal; BUE are 5/5.  Psychiatric:  The pt has Normal affect.  She is anxious   CBC    Component Value Date/Time   WBC 6.2 09/03/2021 1518   RBC 4.97 09/03/2021 1518   HGB 14.5 09/03/2021 1518   HCT 44.7 09/03/2021 1518   PLT 145 (L) 09/03/2021 1518   MCV 89.9 09/03/2021 1518   MCH 29.2 09/03/2021 1518   MCHC 32.4 09/03/2021 1518   RDW 14.5 09/03/2021 1518   LYMPHSABS 2.2 09/03/2021 1518   MONOABS 0.5 09/03/2021 1518   EOSABS 0.1 09/03/2021 1518   BASOSABS 0.1 09/03/2021 1518    BMET    Component Value Date/Time   NA 138 09/03/2021 1518   K 3.9 09/03/2021 1518   CL 105 09/03/2021 1518   CO2 24 09/03/2021 1518   GLUCOSE 99 09/03/2021 1518   BUN 6 09/03/2021 1518   CREATININE 0.79 09/03/2021 1518   CALCIUM 8.9 09/03/2021 1518   GFRNONAA >60 09/03/2021 1518   GFRAA >60 12/17/2019 2301    COAGS: Lab Results  Component Value Date   INR 1.2 08/17/2021   INR 1.0 01/10/2021     Non-Invasive Vascular Imaging:   Carotid duplex 09/03/2021 Right 80-99% ICA stenosis Left occluded   ASSESSMENT/PLAN: This is a 55 y.o. female with asymptomatic right carotid artery stenosis and left occluded ICA  -pt with known bilateral carotid artery disease.  Per notes from [facility] pt was in the 60-79% range  in February 2022 and was scheduled to return to their office in 6 months.  While being evaluated here, she asked for carotid duplex since she missed her appointment.  This revealed 80-99% right ICA stenosis, which has progressed from earlier this year.  Her diastolic velocity on this scan was 122cm/s.  I am unable to see results from February to check velocity to let pt know as she was wanting to know how much it had progressed.   -discussed with pt the need for intervention but she does not want to have any intervention done until after Christmas as her mom is dealing with stage IV pancreatic cancer.  Pt is feeling quite overwhelmed currently. She most likely will be candidate for right TCAR and will need to be on Plavix prior to and afterward in addition to anticoagulation for her DVT.   -discussed importance of smoking cessation. -Dr. Stanford Breed will be in to see pt later this morning -continue statin/asa   Leontine Locket, PA-C [facility]  [phone]  VASCULAR STAFF ADDENDUM: I have independently interviewed and examined the patient. I agree with the above.  Asymptomatic severe right carotid artery stenosis with contralateral occlusion. Will need elective, outpatient revascularization. Likely TCAR.  Will pursue this once infectious problem has resolved. Will need CTA head / neck prior to outpatient appointment. Recommend anticoagulation alone for UE catheter associated DVT.  Yevonne Aline. Stanford Breed, MD [facility] Phone Number: [phone] 09/04/2021 3:05 PM

## 2021-09-04 NOTE — ED Notes (Signed)
Breakfast orders placed 

## 2021-09-04 NOTE — Progress Notes (Signed)
ANTICOAGULATION CONSULT NOTE  Pharmacy Consult for heparin Indication: DVT  Allergies  Allergen Reactions   Clomiphene Citrate Nausea And Vomiting   Morphine And Related Other (See Comments)    PATIENT REFUSES THIS MEDICATION: states that she does not tolerate this medication well   Sulfa Antibiotics Other (See Comments)    Not sure ? rash   Clomiphene Rash    Patient Measurements: Height: 5\' 4"  (162.6 cm) Weight: 104 kg (229 lb 4.5 oz) IBW/kg (Calculated) : 54.7  Vital Signs: BP: 126/115 (12/07 1245) Pulse Rate: 88 (12/07 1245)  Labs: Recent Labs    09/03/21 1518 09/04/21 0300 09/04/21 1148 09/04/21 1429  HGB 14.5  --  13.1  --   HCT 44.7  --  41.3  --   PLT 145*  --  112*  --   HEPARINUNFRC  --  0.78*  --  0.71*  CREATININE 0.79  --  0.88  --      Estimated Creatinine Clearance: 82.8 mL/min (by C-G formula based on SCr of 0.88 mg/dL).   Medical History: Past Medical History:  Diagnosis Date   Anxiety    Arthritis    oa needs hip replacement on right   Asthma    allergies    Cancer ([place])    squamous cell areas removed from vulva and pre caner areas removed from leg    Carotid artery occlusion    Complication of anesthesia    major depression after general anesthesia   Coronary artery disease    LHC 11/25/12 90% stenosis mid LCx & otherwise nonobstructive dz w/ EF 60-65% S/p PTCA/DES to LCx   Depression    Dyspnea    with exertion    Elevated cholesterol    Exertional dyspnea    chronic   Fibromyalgia    GERD (gastroesophageal reflux disease)    History of cardiovascular stress test 06/2015   low risk   HPV in female    Hyperlipidemia    Hypertension    MI (myocardial infarction) ([place]) fe 17, 2014   Obesity    Pneumonia    hx of x 2 years ago    Polycystic ovary disease    Skin tear of upper arm without complication, left, sequela    small skin tear left upper arm no drainage for 1 week   Sleep apnea    mild no cpap    Tobacco abuse      Medications:  (Not in a hospital admission)  Assessment: 19 YOF with acute RUE DVT near her chronic PICC line. Pharmacy consulted to start IV heparin.   Currently on IV heparin at 1550 units/hr and anti-Xa level is slightly supratherapeutic. H/H wnl, Plt down to 112k. No overt s/s of bleeding   Goal of Therapy:  Heparin level 0.3-0.7 units/ml Monitor platelets by anticoagulation protocol: Yes   Plan:  -Dec heparin to 1500 units/hr -F/u 2200 HL   Albertina Parr, PharmD., BCPS, BCCCP Clinical Pharmacist Please refer to [facility] for unit-specific pharmacist

## 2021-09-04 NOTE — Progress Notes (Signed)
[facility]  PROGRESS NOTE  [name] DZH:[id number] DOB: 11-06-1965 DOA: 09/03/2021 PCP: Carolee Rota, NP   Brief HPI:   55 year old female with history of anxiety, asthma, CAD, carotid artery disease, CHF, hypertension, GERD, hyperlipidemia, thrombocytopenia presented with difficulty flushing her PICC line and right upper extremity swelling.  She was recently admitted to hospital from 11/18 until 11/22 for chest pain, found to have Klebsiella bacteremia due to liver abscess.  PICC line was placed on 11/22 and was discharged on ceftriaxone and Flagyl through 12/17. Right upper extremity venous duplex showed acute DVT in the right axillary brachial and basilic vein all surrounding the PICC line.  Carotid Doppler was also performed which showed ICA stenosis and right of 80 to 99% and left carotid artery total occlusion.  This is worse from 2019 with left carotid of 80-99% and right side ICA in the 60s- 70s range.  Patient was started on IV heparin.    Subjective   This morning complains of swelling in the right upper extremity.  Also complains of pain.   Assessment/Plan:     Right upper extremity DVT in the setting of PICC line placed on 11/22 -Patient started on IV heparin; pharmacy is dosing -Discussed with interventional radiology, as per [facility] line can be removed by IV team -Patient will likely need anticoagulation for 3 months  Klebsiella bacteremia -Patient was discharged on ceftriaxone and Flagyl as above -She is supposed to continue with antibiotics till 09/14/2021 -We will discuss with ID; if antibiotics can be changed to p.o. for last 10 days of treatment -IR says that they would not recommend putting another PICC line or midline  Carotid artery disease -Carotid artery duplex shows total occlusion of left carotid artery and 80 to 99% occlusion in the right carotid artery -This has progressed from 2019 -Patient is already on anticoagulation with heparin -We  will obtain vascular surgery consultation  Chronic diastolic CHF -Last echo from 2019 showed EF 55 to 60% -Heart cath in November 2019 showed mild to moderate nonobstructive CAD, patent left circumflex stent -Continue home aspirin, rosuvastatin  Asthma -Continue as needed albuterol  Anxiety -Restart Lexapro, Xanax        Medications     aspirin EC  81 mg Oral Daily   bisoprolol-hydrochlorothiazide  1 tablet Oral Daily   escitalopram  20 mg Oral QHS   pantoprazole  40 mg Oral Daily   rosuvastatin  20 mg Oral QHS   sodium chloride flush  3 mL Intravenous Q12H     Data Reviewed:   CBG:  No results for input(s): GLUCAP in the last 168 hours.  SpO2: 100 %    Vitals:   09/04/21 0700 09/04/21 0830 09/04/21 0900 09/04/21 1245  BP: 133/76 124/79 128/71 (!) 126/115  Pulse: (!) 57 83 61 88  Resp: 16 16 10 20   Temp:      TempSrc:      SpO2: 97% 96% 100% 100%  Weight:      Height:         Intake/Output Summary (Last 24 hours) at 09/04/2021 1421 Last data filed at 09/04/2021 1240 Gross per 24 hour  Intake 100 ml  Output --  Net 100 ml    No intake/output data recorded.  Filed Weights   09/03/21 1739  Weight: 104 kg    Data Reviewed: Basic Metabolic Panel: Recent Labs  Lab 09/03/21 1518 09/04/21 1148  NA 138 136  K 3.9 3.8  CL 105 102  CO2  24 23  GLUCOSE 99 101*  BUN 6 9  CREATININE 0.79 0.88  CALCIUM 8.9 8.5*   Liver Function Tests: No results for input(s): AST, ALT, ALKPHOS, BILITOT, PROT, ALBUMIN in the last 168 hours. No results for input(s): LIPASE, AMYLASE in the last 168 hours. No results for input(s): AMMONIA in the last 168 hours. CBC: Recent Labs  Lab 09/03/21 1518 09/04/21 1148  WBC 6.2 5.0  NEUTROABS 3.2  --   HGB 14.5 13.1  HCT 44.7 41.3  MCV 89.9 90.4  PLT 145* 112*   Cardiac Enzymes: No results for input(s): CKTOTAL, CKMB, CKMBINDEX, TROPONINI in the last 168 hours. BNP (last 3 results) No results for input(s): BNP in  the last 8760 hours.  ProBNP (last 3 results) No results for input(s): PROBNP in the last 8760 hours.  CBG: No results for input(s): GLUCAP in the last 168 hours.     Radiology Reports  VAS US CAROTID  Result Date: 09/03/2021 Carotid Arterial Duplex Study Patient Name:  Shannon [name]  Date of Exam:   09/03/2021 Medical Rec #: [id number]      Accession #:    [id number] Date of Birth: [birth year]      Patient Gender: F Patient Age:   40 years Exam Location:  [facility] Procedure:      VAS US CAROTID Referring Phys: Charmaine Downs --------------------------------------------------------------------------------  Indications:       Carotid artery disease. Risk Factors:      Hypertension, hyperlipidemia, current smoker, prior MI,                    coronary artery disease. Comparison Study:  11/26/2020 carotid artery duplex performed at outside                    facility- Right ICA 60-79% stenosis, left ICA chronic                    occlusion. Performing Technologist: Maudry Mayhew MHA, RDMS, RVT, RDCS  Examination Guidelines: A complete evaluation includes B-mode imaging, spectral Doppler, color Doppler, and power Doppler as needed of all accessible portions of each vessel. Bilateral testing is considered [name] integral part of a complete examination. Limited examinations for reoccurring indications may be performed as noted.  Right Carotid Findings: +----------+--------+--------+--------+------------------+---------+           PSV cm/sEDV cm/sStenosisPlaque DescriptionComments  +----------+--------+--------+--------+------------------+---------+ CCA Prox  108     42                                          +----------+--------+--------+--------+------------------+---------+ CCA Distal96      37                                          +----------+--------+--------+--------+------------------+---------+ ICA Prox  263     122             calcific          Shadowing  +----------+--------+--------+--------+------------------+---------+ ICA Mid   167     55                                          +----------+--------+--------+--------+------------------+---------+  ICA Distal118     62                                          +----------+--------+--------+--------+------------------+---------+ ECA       46      8               calcific          shadowing +----------+--------+--------+--------+------------------+---------+ +----------+--------+-------+----------------+-------------------+           PSV cm/sEDV cmsDescribe        Arm Pressure (mmHG) +----------+--------+-------+----------------+-------------------+ Subclavian132            Multiphasic, WNL                    +----------+--------+-------+----------------+-------------------+ +---------+--------+--+--------+--+---------+ VertebralPSV cm/s69EDV cm/s22Antegrade +---------+--------+--+--------+--+---------+  Left Carotid Findings: +----------+--------+--------+--------+-------------------------------+--------+           PSV cm/sEDV cm/sStenosisPlaque Description             Comments +----------+--------+--------+--------+-------------------------------+--------+ CCA Prox  111     10                                                      +----------+--------+--------+--------+-------------------------------+--------+ CCA Distal40      8               heterogenous, calcific and                                                focal                                   +----------+--------+--------+--------+-------------------------------+--------+ ICA Prox  22              Occludedheterogenous, hyperechoic and                                             irregular                               +----------+--------+--------+--------+-------------------------------+--------+ ICA Distal                Occluded                                         +----------+--------+--------+--------+-------------------------------+--------+ ECA       87      15              heterogenous and smooth                 +----------+--------+--------+--------+-------------------------------+--------+ +----------+--------+--------+----------------+-------------------+           PSV cm/sEDV cm/sDescribe        Arm Pressure (mmHG) +----------+--------+--------+----------------+-------------------+ [id number]             Multiphasic, WNL                    +----------+--------+--------+----------------+-------------------+ +---------+--------+--+--------+--+---------+  VertebralPSV cm/s50EDV cm/s22Antegrade +---------+--------+--+--------+--+---------+   Summary: Right Carotid: Velocities in the right ICA are consistent with a 80-99%                stenosis. Left Carotid: Evidence consistent with a total occlusion of the left ICA. Vertebrals:  Bilateral vertebral arteries demonstrate antegrade flow. Subclavians: Normal flow hemodynamics were seen in bilateral subclavian              arteries. *See table(s) above for measurements and observations.  Electronically signed by Deitra Mayo MD on 09/03/2021 at 6:31:18 PM.    Final    UE VENOUS DUPLEX (7am - 7pm)  Result Date: 09/03/2021 UPPER VENOUS STUDY  Patient Name:  Shannon [name]  Date of Exam:   09/03/2021 Medical Rec #: [id number]      Accession #:    [id number] Date of Birth: Feb 25, 1966      Patient Gender: F Patient Age:   52 years Exam Location:  [facility] Procedure:      VAS [place] UPPER EXTREMITY VENOUS DUPLEX Referring Phys: Charmaine Downs --------------------------------------------------------------------------------  Indications: Edema, and RUE PICC line Limitations: Bandages. Comparison Study: No prior study Performing Technologist: Maudry Mayhew MHA, RDMS, RVT, RDCS  Examination Guidelines: A complete evaluation includes B-mode imaging, spectral Doppler, color Doppler, and  power Doppler as needed of all accessible portions of each vessel. Bilateral testing is considered [name] integral part of a complete examination. Limited examinations for reoccurring indications may be performed as noted.  Right Findings: +----------+------------+---------+-----------+----------+-------+ RIGHT     CompressiblePhasicitySpontaneousPropertiesSummary +----------+------------+---------+-----------+----------+-------+ IJV           Full                 Yes                      +----------+------------+---------+-----------+----------+-------+ Subclavian    Full                 Yes                      +----------+------------+---------+-----------+----------+-------+ Axillary      None                 No                Acute  +----------+------------+---------+-----------+----------+-------+ Brachial      None                 No                Acute  +----------+------------+---------+-----------+----------+-------+ Radial        Full                                          +----------+------------+---------+-----------+----------+-------+ Ulnar         Full                                          +----------+------------+---------+-----------+----------+-------+ Cephalic      Full                                          +----------+------------+---------+-----------+----------+-------+ Basilic  None                                   Acute  +----------+------------+---------+-----------+----------+-------+  Left Findings: +----------+------------+---------+-----------+----------+-------+ LEFT      CompressiblePhasicitySpontaneousPropertiesSummary +----------+------------+---------+-----------+----------+-------+ Subclavian               Yes       Yes                      +----------+------------+---------+-----------+----------+-------+  Summary:  Right: Findings consistent with acute deep vein thrombosis involving the right  axillary vein and right brachial veins and superficial vein thrombosis involving the right basilic vein, all surrounding the PICC line.  Left: No evidence of thrombosis in the subclavian.  *See table(s) above for measurements and observations.  Diagnosing physician: Deitra Mayo MD Electronically signed by Deitra Mayo MD on 09/03/2021 at 6:31:38 PM.    Final        Antibiotics: Anti-infectives (From admission, onward)    Start     Dose/Rate Route Frequency Ordered Stop   09/03/21 2130  cefTRIAXone (ROCEPHIN) 2 g in sodium chloride 0.9 % 100 mL IVPB        2 g 200 mL/hr over 30 Minutes Intravenous Every 24 hours 09/03/21 2024     09/03/21 2130  metroNIDAZOLE (FLAGYL) IVPB 500 mg        500 mg 100 mL/hr over 60 Minutes Intravenous Every 12 hours 09/03/21 2024           DVT prophylaxis: Full dose heparin  Code Status: Full code  Family Communication: No family at bedside   Consultants:   Procedures:     Objective    Physical Examination:  General-appears in no acute distress Heart-S1-S2, regular, no murmur auscultated Lungs-clear to auscultation bilaterally, no wheezing or crackles auscultated Abdomen-soft, nontender, no organomegaly Extremities-right upper extremity swelling noted, no erythema, PICC line in place Neuro-alert, oriented x3, no focal deficit noted  Status is: Inpatient  Dispo: The patient is from: Home              Anticipated d/c is to: Home              Anticipated d/c date is: 09/07/2021              Patient currently not stable for discharge  Barrier to discharge-ongoing treatment for DVT of right upper extremity  COVID-19 Labs  No results for input(s): DDIMER, FERRITIN, LDH, CRP in the last 72 hours.  Lab Results  Component Value Date   SARSCOV2NAA NEGATIVE 09/03/2021   [place] NEGATIVE 08/16/2021   [place] Not Detected 04/05/2021   [place] NEGATIVE 01/14/2021            Recent Results (from the  past 240 hour(s))  Resp Panel by RT-PCR (Flu A&B, Covid) Nasopharyngeal Swab     Status: None   Collection Time: 09/03/21  7:24 PM   Specimen: Nasopharyngeal Swab; Nasopharyngeal(NP) swabs in vial transport medium  Result Value Ref Range Status   SARS Coronavirus 2 by RT PCR NEGATIVE NEGATIVE Final    Comment: (NOTE) SARS-CoV-2 target nucleic acids are NOT DETECTED.  The SARS-CoV-2 RNA is generally detectable in upper respiratory specimens during the acute phase of infection. The lowest concentration of SARS-CoV-2 viral copies this assay can detect is 138 copies/mL. A negative result does not preclude SARS-Cov-2 infection and should not be used as the sole basis  for treatment or other patient management decisions. A negative result may occur with  improper specimen collection/handling, submission of specimen other than nasopharyngeal swab, presence of viral mutation(s) within the areas targeted by this assay, and inadequate number of viral copies(<138 copies/mL). A negative result must be combined with clinical observations, patient history, and epidemiological information. The expected result is Negative.  Fact Sheet for Patients:  EntrepreneurPulse.com.au  Fact Sheet for Healthcare Providers:  IncredibleEmployment.be  This test is no t yet approved or cleared by the [place] FDA and  has been authorized for detection and/or diagnosis of SARS-CoV-2 by FDA under [name] Emergency Use Authorization (EUA). This EUA will remain  in effect (meaning this test can be used) for the duration of the COVID-19 declaration under Section 564(b)(1) of the Act, 21 U.S.C.section 360bbb-3(b)(1), unless the authorization is terminated  or revoked sooner.       Influenza A by PCR NEGATIVE NEGATIVE Final   Influenza B by PCR NEGATIVE NEGATIVE Final    Comment: (NOTE) The Xpert Xpress SARS-CoV-2/FLU/RSV plus assay is intended as [name] aid in the diagnosis of  influenza from Nasopharyngeal swab specimens and should not be used as a sole basis for treatment. Nasal washings and aspirates are unacceptable for Xpert Xpress SARS-CoV-2/FLU/RSV testing.  Fact Sheet for Patients: EntrepreneurPulse.com.au  Fact Sheet for Healthcare Providers: IncredibleEmployment.be  This test is not yet approved or cleared by the [place] FDA and has been authorized for detection and/or diagnosis of SARS-CoV-2 by FDA under [name] Emergency Use Authorization (EUA). This EUA will remain in effect (meaning this test can be used) for the duration of the COVID-19 declaration under Section 564(b)(1) of the Act, 21 U.S.C. section 360bbb-3(b)(1), unless the authorization is terminated or revoked.  Performed at [facility], [place] [address], [place], [place] [address]     [facility] If 7PM-7AM, please contact night-coverage at [email or web address], Office  [phone]   09/04/2021, 2:21 PM  LOS: 0 days

## 2021-09-05 ENCOUNTER — Other Ambulatory Visit (HOSPITAL_COMMUNITY): Payer: Self-pay

## 2021-09-05 DIAGNOSIS — I82621 Acute embolism and thrombosis of deep veins of right upper extremity: Secondary | ICD-10-CM | POA: Diagnosis not present

## 2021-09-05 DIAGNOSIS — I6523 Occlusion and stenosis of bilateral carotid arteries: Secondary | ICD-10-CM

## 2021-09-05 DIAGNOSIS — B961 Klebsiella pneumoniae [K. pneumoniae] as the cause of diseases classified elsewhere: Secondary | ICD-10-CM | POA: Diagnosis not present

## 2021-09-05 DIAGNOSIS — R7881 Bacteremia: Secondary | ICD-10-CM | POA: Diagnosis not present

## 2021-09-05 DIAGNOSIS — I25118 Atherosclerotic heart disease of native coronary artery with other forms of angina pectoris: Secondary | ICD-10-CM | POA: Diagnosis not present

## 2021-09-05 DIAGNOSIS — I5032 Chronic diastolic (congestive) heart failure: Secondary | ICD-10-CM | POA: Diagnosis not present

## 2021-09-05 LAB — CBC
HCT: 38.5 % (ref 36.0–46.0)
Hemoglobin: 12.2 g/dL (ref 12.0–15.0)
MCH: 28.6 pg (ref 26.0–34.0)
MCHC: 31.7 g/dL (ref 30.0–36.0)
MCV: 90.4 fL (ref 80.0–100.0)
Platelets: 105 10*3/uL — ABNORMAL LOW (ref 150–400)
RBC: 4.26 MIL/uL (ref 3.87–5.11)
RDW: 14.6 % (ref 11.5–15.5)
WBC: 5 10*3/uL (ref 4.0–10.5)
nRBC: 0 % (ref 0.0–0.2)

## 2021-09-05 LAB — COMPREHENSIVE METABOLIC PANEL
ALT: 20 U/L (ref 0–44)
AST: 16 U/L (ref 15–41)
Albumin: 2.8 g/dL — ABNORMAL LOW (ref 3.5–5.0)
Alkaline Phosphatase: 55 U/L (ref 38–126)
Anion gap: 8 (ref 5–15)
BUN: 11 mg/dL (ref 6–20)
CO2: 22 mmol/L (ref 22–32)
Calcium: 8.4 mg/dL — ABNORMAL LOW (ref 8.9–10.3)
Chloride: 104 mmol/L (ref 98–111)
Creatinine, Ser: 0.88 mg/dL (ref 0.44–1.00)
GFR, Estimated: >60 mL/min (ref >60)
Glucose, Bld: 111 mg/dL — ABNORMAL HIGH (ref 70–99)
Potassium: 3.8 mmol/L (ref 3.5–5.1)
Sodium: 134 mmol/L — ABNORMAL LOW (ref 135–145)
Total Bilirubin: 0.1 mg/dL — ABNORMAL LOW (ref 0.3–1.2)
Total Protein: 5.8 g/dL — ABNORMAL LOW (ref 6.5–8.1)

## 2021-09-05 LAB — HEPARIN LEVEL (UNFRACTIONATED): Heparin Unfractionated: 0.72 IU/mL — ABNORMAL HIGH (ref 0.30–0.70)

## 2021-09-05 MED ORDER — APIXABAN 5 MG PO TABS: 5.0000 mg | ORAL_TABLET | Freq: Two times a day (BID) | ORAL | 3 refills | Status: DC

## 2021-09-05 MED ORDER — ASPIRIN EC 81 MG PO TBEC: 81.0000 mg | DELAYED_RELEASE_TABLET | Freq: Every day | ORAL | 11 refills | Status: AC

## 2021-09-05 MED ORDER — APIXABAN 5 MG PO TABS
5.0000 mg | ORAL_TABLET | Freq: Two times a day (BID) | ORAL | 3 refills | Status: DC
  Filled 2021-09-05: qty 74, 30d supply, fill #0

## 2021-09-05 MED ORDER — AMOXICILLIN-POT CLAVULANATE 875-125 MG PO TABS: 1.0000 | ORAL_TABLET | Freq: Two times a day (BID) | ORAL | 0 refills | Status: DC

## 2021-09-05 MED ORDER — APIXABAN 5 MG PO TABS
10.0000 mg | ORAL_TABLET | Freq: Two times a day (BID) | ORAL | Status: DC
  Administered 2021-09-05: 10 mg via ORAL
  Filled 2021-09-05: qty 2

## 2021-09-05 MED ORDER — APIXABAN 5 MG PO TABS: 5.0000 mg | ORAL_TABLET | Freq: Two times a day (BID) | ORAL | Status: DC

## 2021-09-05 MED ORDER — AMOXICILLIN-POT CLAVULANATE 875-125 MG PO TABS
1.0000 | ORAL_TABLET | Freq: Two times a day (BID) | ORAL | 0 refills | Status: DC
Start: 2021-09-06 — End: 2021-09-09
  Filled 2021-09-05: qty 28, 14d supply, fill #0

## 2021-09-05 MED ORDER — METRONIDAZOLE 500 MG PO TABS
500.0000 mg | ORAL_TABLET | Freq: Two times a day (BID) | ORAL | Status: DC
  Administered 2021-09-05: 500 mg via ORAL
  Filled 2021-09-05: qty 1

## 2021-09-05 MED ORDER — AMOXICILLIN-POT CLAVULANATE 875-125 MG PO TABS: 1.0000 | ORAL_TABLET | Freq: Two times a day (BID) | ORAL | Status: DC

## 2021-09-05 NOTE — Discharge Instructions (Addendum)
Take Eliquis 10 mg by mouth twice daily for 7 days, then on 09/12/2021 start taking Eliquis 5 mg daily for at least 3 months  Do not stop this medication unless told by a healthcare provider -------------------------------------------------------------------------------------------------------------------------------------------------------------------------- Information on my medicine - ELIQUIS (apixaban)  This medication education was reviewed with me or my healthcare representative as part of my discharge preparation.  Why was Eliquis prescribed for you? Eliquis was prescribed to treat blood clots that may have been found in the veins of your legs (deep vein thrombosis) or in your lungs (pulmonary embolism) and to reduce the risk of them occurring again.  What do You need to know about Eliquis ? The starting dose is 10 mg (two 5 mg tablets) taken TWICE daily for the FIRST SEVEN (7) DAYS, then on 09/12/21  the dose is reduced to ONE 5 mg tablet taken TWICE daily.  Eliquis may be taken with or without food.   Try to take the dose about the same time in the morning and in the evening. If you have difficulty swallowing the tablet whole please discuss with your pharmacist how to take the medication safely.  Take Eliquis exactly as prescribed and DO NOT stop taking Eliquis without talking to the doctor who prescribed the medication.  Stopping may increase your risk of developing a new blood clot.  Refill your prescription before you run out.  After discharge, you should have regular check-up appointments with your healthcare provider that is prescribing your Eliquis.    What do you do if you miss a dose? If a dose of ELIQUIS is not taken at the scheduled time, take it as soon as possible on the same day and twice-daily administration should be resumed. The dose should not be doubled to make up for a missed dose.  Important Safety Information A possible side effect of Eliquis is  bleeding. You should call your healthcare provider right away if you experience any of the following: Bleeding from an injury or your nose that does not stop. Unusual colored urine (red or dark brown) or unusual colored stools (red or black). Unusual bruising for unknown reasons. A serious fall or if you hit your head (even if there is no bleeding).  Some medicines may interact with Eliquis and might increase your risk of bleeding or clotting while on Eliquis. To help avoid this, consult your healthcare provider or pharmacist prior to using any new prescription or non-prescription medications, including herbals, vitamins, non-steroidal anti-inflammatory drugs (NSAIDs) and supplements.  This website has more information on Eliquis (apixaban): http://www.eliquis.com/eliquis/home

## 2021-09-05 NOTE — Progress Notes (Signed)
ANTICOAGULATION CONSULT NOTE- Initial Consult  Pharmacy Consult to transition heparin to apixaban Indication: DVT  Allergies  Allergen Reactions   Clomiphene Citrate Nausea And Vomiting   Morphine And Related Other (See Comments)    PATIENT REFUSES THIS MEDICATION: states that she does not tolerate this medication well   Sulfa Antibiotics Other (See Comments)    Not sure ? rash   Clomiphene Rash    Patient Measurements: Height: 5\' 4"  (162.6 cm) Weight: 106 kg (233 lb 11 oz) IBW/kg (Calculated) : 54.7  Vital Signs: Temp: 97.6 F (36.4 C) (12/08 0850) Temp Source: Oral (12/08 0850) BP: 112/71 (12/08 0850) Pulse Rate: 57 (12/08 0850)  Labs: Recent Labs    09/03/21 1518 09/04/21 0300 09/04/21 1148 09/04/21 1429 09/04/21 2222 09/05/21 0125 09/05/21 0641  HGB 14.5  --  13.1  --   --  12.2  --   HCT 44.7  --  41.3  --   --  38.5  --   PLT 145*  --  112*  --   --  105*  --   HEPARINUNFRC  --    < >  --  0.71* 0.78*  --  0.72*  CREATININE 0.79  --  0.88  --   --  0.88  --    < > = values in this interval not displayed.     Estimated Creatinine Clearance: 83.7 mL/min (by C-G formula based on SCr of 0.88 mg/dL).   Medical History: Past Medical History:  Diagnosis Date   Anxiety    Arthritis    oa needs hip replacement on right   Asthma    allergies    Cancer (HCC)    squamous cell areas removed from vulva and pre caner areas removed from leg    Carotid artery occlusion    Complication of anesthesia    major depression after general anesthesia   Coronary artery disease    LHC 11/25/12 90% stenosis mid LCx & otherwise nonobstructive dz w/ EF 60-65% S/p PTCA/DES to LCx   Depression    Dyspnea    with exertion    Elevated cholesterol    Exertional dyspnea    chronic   Fibromyalgia    GERD (gastroesophageal reflux disease)    History of cardiovascular stress test 06/2015   low risk   HPV in female    Hyperlipidemia    Hypertension    MI (myocardial  infarction) (HCC) fe 17, 2014   Obesity    Pneumonia    hx of x 2 years ago    Polycystic ovary disease    Skin tear of upper arm without complication, left, sequela    small skin tear left upper arm no drainage for 1 week   Sleep apnea    mild no cpap    Tobacco abuse     Medications:  Medications Prior to Admission  Medication Sig Dispense Refill Last Dose   albuterol (PROVENTIL HFA;VENTOLIN HFA) 108 (90 Base) MCG/ACT inhaler Inhale 2 puffs into the lungs every 4 (four) hours as needed for wheezing or shortness of breath. 1 Inhaler 0 unknown   ALPRAZolam (XANAX) 1 MG tablet Take 1 mg by mouth 2 (two) times daily as needed for anxiety.  2 09/03/2021   aspirin EC 81 MG tablet Take 81 mg by mouth daily. Swallow whole.   Past Week   bisoprolol-hydrochlorothiazide (ZIAC) 5-6.25 MG tablet Take 1 tablet by mouth daily. Pt takes in the pm   09/01/2021  cefTRIAXone (ROCEPHIN) IVPB Inject 2 g into the vein daily for 25 days. Indication:  Kleb PNA bacteremia + liver abscess First Dose: Yes Last Day of Therapy:  09/14/21 Labs - Once weekly:  CBC/D and BMP, Labs - Every other week:  ESR and CRP Method of administration: IV Push Method of administration may be changed at the discretion of home infusion pharmacist based upon assessment of the patient and/or caregiver's ability to self-administer the medication ordered. 25 Units 0 09/03/2021   escitalopram (LEXAPRO) 20 MG tablet Take 20 mg by mouth at bedtime. Takes in the pm   Past Week   fluticasone (FLONASE) 50 MCG/ACT nasal spray Place 2 sprays into both nostrils at bedtime.   Past Week   metroNIDAZOLE (FLAGYL) 500 MG tablet Take 1 tablet (500 mg total) by mouth 2 (two) times daily for 25 days. 50 tablet 0 09/03/2021   nitroGLYCERIN (NITROSTAT) 0.4 MG SL tablet Place 0.4 mg under the tongue every 5 (five) minutes as needed for chest pain.   unknown   oxyCODONE (OXY IR/ROXICODONE) 5 MG immediate release tablet Take 1 tablet (5 mg total) by mouth  every 6 (six) hours as needed for severe pain or moderate pain. 15 tablet 0 09/02/2021   pantoprazole (PROTONIX) 40 MG tablet Take 40 mg by mouth daily.   Past Week   rosuvastatin (CRESTOR) 10 MG tablet Take 10 mg by mouth at bedtime.   Past Week   sodium chloride flush (NS) 0.9 % SOLN 10-40 mLs by Intracatheter route every 12 (twelve) hours. 500 mL 0 09/03/2021   triamcinolone cream (KENALOG) 0.1 % Apply 1 application topically 2 (two) times daily. (Patient taking differently: Apply 1 application topically 2 (two) times daily as needed (irritation).) 454 g 0 unknown   nicotine (NICODERM CQ - DOSED IN MG/24 HOURS) 21 mg/24hr patch Place 1 patch (21 mg total) onto the skin daily. (Patient not taking: Reported on 09/03/2021) 28 patch 0 Not Taking   rosuvastatin (CRESTOR) 20 MG tablet Take 1 tablet (20 mg total) by mouth at bedtime. (Patient not taking: Reported on 09/03/2021) 30 tablet 6 Not Taking    Assessment: 16 YOF with acute RUE DVT near her chronic PICC line.  Pt also has asymptomatic critical R ICA stenosis, and occluded L ICA. Vascular is planning to wait for infectious process to resolve prior to revascularization. ID is currently planning on 4 wks of IV ceftriaxone and PO Flagyl (through 12/17), then follow with oral Augmentin. Pharmacy now consulted to transition heparin to apixaban. Patient was not on anticoagulation prior to admission.  H/H wnl, Plt down to 105k. No s/s of bleeding noted.  Goal of Therapy:  Therapeutic anticoagulation Monitor platelets by anticoagulation protocol: Yes   Plan:  Discontinue heparin Start apixaban 10 mg PO BID x 7 days, then apixaban 5 mg PO BID Monitor CBC and for signs and symptoms of bleeding Will need to educate patient    Thank you for allowing [place] to participate in this patients care. Jens Som, PharmD 09/05/2021 12:45 PM  **Pharmacist phone directory can be found on [email or web address] listed under [place]**

## 2021-09-05 NOTE — TOC Transition Note (Signed)
Transition of Care [facility]) - CM/SW Discharge Note   Patient Details  Name: Shannon [name] MRN: [id number] Date of Birth: 1966-04-12  Transition of Care [facility]) CM/SW Contact:  Tom-Johnson, Renea Ee, RN Phone Number: 09/05/2021, 3:18 PM   Clinical Narrative:    Patient is admitted on observation. [place] letter done and read and delivered to patient at bedside with understanding verbalized. Patient was recently discharged in November. From home with husband. Does not have children. Currently on disability. Independent with care prior to hospitalization and able to drive self. Has a cane and walker at home. PCP is Carolee Rota, NP and uses [facility] in [place]. No recommendations noted at this time. Scheduled for discharge today. Medications will be delivered by [facility] pharmacy. Husband at bedside and will transport home. No further TOC needs noted.   Final next level of care: Home/Self Care Barriers to Discharge: Barriers Resolved   Patient Goals and CMS Choice Patient states their goals for this hospitalization and ongoing recovery are:: To go home CMS Medicare.gov Compare Post Acute Care list provided to:: Patient Choice offered to / list presented to : NA  Discharge Placement                       Discharge Plan and Services                DME Arranged: N/A DME Agency: NA       HH Arranged: NA HH Agency: NA        Social Determinants of Health (SDOH) Interventions     Readmission Risk Interventions No flowsheet data found.

## 2021-09-05 NOTE — Progress Notes (Signed)
Patient discharging home. Vital signs stable at time of discharge as reflected in discharge summary. Discharge instructions given and verbal understanding returned. Patient discharging with 2 prescriptions filled by TOC. Follow up appointments scheduled. No questions at this time.

## 2021-09-05 NOTE — Progress Notes (Signed)
  Progress Note    09/05/2021 7:45 AM * No surgery found *  Subjective:  No neurological changes overnight   Vitals:   09/04/21 1913 09/05/21 0529  BP: 135/84 114/71  Pulse: (!) 57 (!) 58  Resp: 18 20  Temp: 97.8 F (36.6 C) 97.9 F (36.6 C)  SpO2: 100% 98%   Physical Exam: Lungs:  non labored Extremities:  warm and well perfused Abdomen:  soft, NT Neurologic: A&O; CN grossly intact  CBC    Component Value Date/Time   WBC 5.0 09/05/2021 0125   RBC 4.26 09/05/2021 0125   HGB 12.2 09/05/2021 0125   HCT 38.5 09/05/2021 0125   PLT 105 (L) 09/05/2021 0125   MCV 90.4 09/05/2021 0125   MCH 28.6 09/05/2021 0125   MCHC 31.7 09/05/2021 0125   RDW 14.6 09/05/2021 0125   LYMPHSABS 2.2 09/03/2021 1518   MONOABS 0.5 09/03/2021 1518   EOSABS 0.1 09/03/2021 1518   BASOSABS 0.1 09/03/2021 1518    BMET    Component Value Date/Time   NA 134 (L) 09/05/2021 0125   K 3.8 09/05/2021 0125   CL 104 09/05/2021 0125   CO2 22 09/05/2021 0125   GLUCOSE 111 (H) 09/05/2021 0125   BUN 11 09/05/2021 0125   CREATININE 0.88 09/05/2021 0125   CALCIUM 8.4 (L) 09/05/2021 0125   GFRNONAA >60 09/05/2021 0125   GFRAA >60 12/17/2019 2301    INR    Component Value Date/Time   INR 1.2 08/17/2021 0551     Intake/Output Summary (Last 24 hours) at 09/05/2021 0745 Last data filed at 09/05/2021 0449 Gross per 24 hour  Intake 933.65 ml  Output --  Net 933.65 ml     Assessment/Plan:  55 y.o. female is s/p asymptomatic R ICA stenosis * No surgery found *   Critical R ICA stenosis; L ICA occluded; R ICA stenosis is asymptomatic Plan is to let infectious picture resolve prior to revascularization of R ICA Office will arrange CTA head and neck to help surgical planning Ok for d/c home from vascular standpoint   Dagoberto Ligas, PA-C [facility] [phone] 09/05/2021 7:45 AM

## 2021-09-05 NOTE — Care Management Obs Status (Signed)
[place] NOTIFICATION   Patient Details  Name: Shannon [name] MRN: [id number] Date of Birth: 1966/04/19   Medicare Observation Status Notification Given:  Yes    Tom-Johnson, Renea Ee, RN 09/05/2021, 9:10 AM

## 2021-09-05 NOTE — Progress Notes (Signed)
ANTICOAGULATION CONSULT NOTE  Pharmacy Consult for heparin Indication: DVT  Allergies  Allergen Reactions   Clomiphene Citrate Nausea And Vomiting   Morphine And Related Other (See Comments)    PATIENT REFUSES THIS MEDICATION: states that she does not tolerate this medication well   Sulfa Antibiotics Other (See Comments)    Not sure ? rash   Clomiphene Rash    Patient Measurements: Height: 5\' 4"  (162.6 cm) Weight: 106 kg (233 lb 11 oz) IBW/kg (Calculated) : 54.7  Vital Signs: Temp: 97.6 F (36.4 C) (12/08 0850) Temp Source: Oral (12/08 0850) BP: 112/71 (12/08 0850) Pulse Rate: 57 (12/08 0850)  Labs: Recent Labs    09/03/21 1518 09/04/21 0300 09/04/21 1148 09/04/21 1429 09/04/21 2222 09/05/21 0125 09/05/21 0641  HGB 14.5  --  13.1  --   --  12.2  --   HCT 44.7  --  41.3  --   --  38.5  --   PLT 145*  --  112*  --   --  105*  --   HEPARINUNFRC  --    < >  --  0.71* 0.78*  --  0.72*  CREATININE 0.79  --  0.88  --   --  0.88  --    < > = values in this interval not displayed.     Estimated Creatinine Clearance: 83.7 mL/min (by C-G formula based on SCr of 0.88 mg/dL).   Medical History: Past Medical History:  Diagnosis Date   Anxiety    Arthritis    oa needs hip replacement on right   Asthma    allergies    Cancer ([place])    squamous cell areas removed from vulva and pre caner areas removed from leg    Carotid artery occlusion    Complication of anesthesia    major depression after general anesthesia   Coronary artery disease    LHC 11/25/12 90% stenosis mid LCx & otherwise nonobstructive dz w/ EF 60-65% S/p PTCA/DES to LCx   Depression    Dyspnea    with exertion    Elevated cholesterol    Exertional dyspnea    chronic   Fibromyalgia    GERD (gastroesophageal reflux disease)    History of cardiovascular stress test 06/2015   low risk   HPV in female    Hyperlipidemia    Hypertension    MI (myocardial infarction) ([place]) fe 17, 2014   Obesity     Pneumonia    hx of x 2 years ago    Polycystic ovary disease    Skin tear of upper arm without complication, left, sequela    small skin tear left upper arm no drainage for 1 week   Sleep apnea    mild no cpap    Tobacco abuse     Medications:  Medications Prior to Admission  Medication Sig Dispense Refill Last Dose   albuterol (PROVENTIL HFA;VENTOLIN HFA) 108 (90 Base) MCG/ACT inhaler Inhale 2 puffs into the lungs every 4 (four) hours as needed for wheezing or shortness of breath. 1 Inhaler 0 unknown   ALPRAZolam (XANAX) 1 MG tablet Take 1 mg by mouth 2 (two) times daily as needed for anxiety.  2 09/03/2021   aspirin EC 81 MG tablet Take 81 mg by mouth daily. Swallow whole.   Past Week   bisoprolol-hydrochlorothiazide (ZIAC) 5-6.25 MG tablet Take 1 tablet by mouth daily. Pt takes in the pm   09/01/2021   cefTRIAXone (ROCEPHIN) IVPB Inject 2  g into the vein daily for 25 days. Indication:  Kleb PNA bacteremia + liver abscess First Dose: Yes Last Day of Therapy:  09/14/21 Labs - Once weekly:  CBC/D and BMP, Labs - Every other week:  ESR and CRP Method of administration: IV Push Method of administration may be changed at the discretion of home infusion pharmacist based upon assessment of the patient and/or caregiver's ability to self-administer the medication ordered. 25 Units 0 09/03/2021   escitalopram (LEXAPRO) 20 MG tablet Take 20 mg by mouth at bedtime. Takes in the pm   Past Week   fluticasone (FLONASE) 50 MCG/ACT nasal spray Place 2 sprays into both nostrils at bedtime.   Past Week   metroNIDAZOLE (FLAGYL) 500 MG tablet Take 1 tablet (500 mg total) by mouth 2 (two) times daily for 25 days. 50 tablet 0 09/03/2021   nitroGLYCERIN (NITROSTAT) 0.4 MG SL tablet Place 0.4 mg under the tongue every 5 (five) minutes as needed for chest pain.   unknown   oxyCODONE (OXY IR/ROXICODONE) 5 MG immediate release tablet Take 1 tablet (5 mg total) by mouth every 6 (six) hours as needed for severe pain or  moderate pain. 15 tablet 0 09/02/2021   pantoprazole (PROTONIX) 40 MG tablet Take 40 mg by mouth daily.   Past Week   rosuvastatin (CRESTOR) 10 MG tablet Take 10 mg by mouth at bedtime.   Past Week   sodium chloride flush (NS) 0.9 % SOLN 10-40 mLs by Intracatheter route every 12 (twelve) hours. 500 mL 0 09/03/2021   triamcinolone cream (KENALOG) 0.1 % Apply 1 application topically 2 (two) times daily. (Patient taking differently: Apply 1 application topically 2 (two) times daily as needed (irritation).) 454 g 0 unknown   nicotine (NICODERM CQ - DOSED IN MG/24 HOURS) 21 mg/24hr patch Place 1 patch (21 mg total) onto the skin daily. (Patient not taking: Reported on 09/03/2021) 28 patch 0 Not Taking   rosuvastatin (CRESTOR) 20 MG tablet Take 1 tablet (20 mg total) by mouth at bedtime. (Patient not taking: Reported on 09/03/2021) 30 tablet 6 Not Taking    Assessment: 49 YOF with acute RUE DVT near her chronic PICC line. Pharmacy consulted to start IV heparin. Pt also has asymptomatic critical R ICA stenosis, and occluded L ICA. Vascular is planning to wait for infectious process to resolve prior to revascularization. ID is currently planning on 4 wks of IV ceftriaxone and PO Flagyl (through 12/17), then follow with oral Augmentin.  Currently on IV heparin at 1400 units/hr and anti-Xa level is slightly supratherapeutic. H/H wnl, Plt down to 105k. No s/s of bleeding noted.  Goal of Therapy:  Heparin level 0.3-0.7 units/ml Monitor platelets by anticoagulation protocol: Yes   Plan:  Decrease heparin to 1300 units/hr Check anti-Xa level in 6 hours and daily while on heparin Continue to monitor H&H and platelets F/u change to oral AC?    Thank you for allowing [place] to participate in this patients care. Jens Som, PharmD 09/05/2021 9:26 AM  **Pharmacist phone directory can be found on [email or web address] listed under [place]**

## 2021-09-05 NOTE — Discharge Summary (Signed)
Physician Discharge Summary  Shannon [name] DZH:[id number] DOB: 25-Oct-1965 DOA: 09/03/2021  PCP: Carolee Rota, NP  Admit date: 09/03/2021 Discharge date: 09/05/2021  Time spent: 60 minutes  Recommendations for Outpatient Follow-up:  Follow-up PCP in 2 weeks  Discharge Diagnoses:  Principal Problem:   DVT (deep venous thrombosis) ([place]) Active Problems:   Coronary artery disease   Mixed hyperlipidemia   Essential hypertension   GERD (gastroesophageal reflux disease)   Chronic diastolic HF (heart failure) (H[facility])   Anxiety   Bacteremia due to Klebsiella pneumoniae   Discharge Condition: Stable  Diet recommendation: Heart healthy diet  Filed Weights   09/03/21 1739 09/04/21 1913  Weight: 104 kg 106 kg    History of present illness:  55 year old female with history of anxiety, asthma, CAD, carotid artery disease, CHF, hypertension, GERD, hyperlipidemia, thrombocytopenia presented with difficulty flushing her PICC line and right upper extremity swelling.  She was recently admitted to hospital from 11/18 until 11/22 for chest pain, found to have Klebsiella bacteremia due to liver abscess.  PICC line was placed on 11/22 and was discharged on ceftriaxone and Flagyl through 12/17. Right upper extremity venous duplex showed acute DVT in the right axillary brachial and basilic vein all surrounding the PICC line.  Carotid Doppler was also performed which showed ICA stenosis and right of 80 to 99% and left carotid artery total occlusion.  This is worse from 2019 with left carotid of 80-99% and right side ICA in the 60s- 70s range.  Patient was started on IV heparin.    Hospital Course:   Right upper extremity DVT in the setting of PICC line placed on 11/22 -Patient started on IV heparin; pharmacy is dosing -PICC line was removed -Patient will  need anticoagulation for 3 months -Heparin has been discontinued, started on Eliquis  - patient to be discharged on Eliquis   Klebsiella  bacteremia -Patient was discharged on ceftriaxone and Flagyl as above -She is supposed to continue with antibiotics till 09/14/2021 -IR says that they would not recommend putting another PICC line or midline -ID was consulted, they recommended switching patient to Augmentin 1 tablet p.o. twice daily for total 28 more doses and stopping IV antibiotics.   Carotid artery disease -Carotid artery duplex shows total occlusion of left carotid artery and 80 to 99% occlusion in the right carotid artery -This has progressed from 2019 -Patient was started on IV heparin which has been switched to Eliquis -Vascular surgery consulted, they plan to do CT head and neck as outpatient TCAR -She will continue on aspirin and rosuvastatin    Chronic diastolic CHF -Last echo from 2019 showed EF 55 to 60% -Heart cath in November 2019 showed mild to moderate nonobstructive CAD, patent left circumflex stent -Continue home aspirin, rosuvastatin   Asthma -Continue as needed albuterol   Anxiety -Restart Lexapro, Xanax  Procedures: PICC line was removed  Consultations: Vascular surgery  Discharge Exam: Vitals:   09/05/21 0529 09/05/21 0850  BP: 114/71 112/71  Pulse: (!) 58 (!) 57  Resp: 20 20  Temp: 97.9 F (36.6 C) 97.6 F (36.4 C)  SpO2: 98% 99%    General: Appears in no acute distress Cardiovascular: S1-S2, regular Respiratory: Clear to auscultation bilaterally  Discharge Instructions   Discharge Instructions     Diet - low sodium heart healthy   Complete by: As directed    Increase activity slowly   Complete by: As directed       Allergies as of 09/05/2021  Reactions   Clomiphene Citrate Nausea And Vomiting   Morphine And Related Other (See Comments)   PATIENT REFUSES THIS MEDICATION: states that she does not tolerate this medication well   Sulfa Antibiotics Other (See Comments)   Not sure ? rash   Clomiphene Rash        Medication List     STOP taking these  medications    cefTRIAXone  IVPB Commonly known as: ROCEPHIN   metroNIDAZOLE 500 MG tablet Commonly known as: Flagyl       TAKE these medications    albuterol 108 (90 Base) MCG/ACT inhaler Commonly known as: VENTOLIN HFA Inhale 2 puffs into the lungs every 4 (four) hours as needed for wheezing or shortness of breath.   ALPRAZolam 1 MG tablet Commonly known as: XANAX Take 1 mg by mouth 2 (two) times daily as needed for anxiety.   amoxicillin-clavulanate 875-125 MG tablet Commonly known as: AUGMENTIN Take 1 tablet by mouth every 12 (twelve) hours. Start taking on: September 06, 2021   apixaban 5 MG Tabs tablet Commonly known as: ELIQUIS Take 1 tablet (5 mg total) by mouth 2 (two) times daily. Take 10 mg ( 2 tab) twice daily for 7 days till 09/12/21 , then start 5 mg po twice daily for at least 3 months Start taking on: September 12, 2021   aspirin EC 81 MG tablet Take 1 tablet (81 mg total) by mouth daily. Swallow whole.   bisoprolol-hydrochlorothiazide 5-6.25 MG tablet Commonly known as: ZIAC Take 1 tablet by mouth daily. Pt takes in the pm   escitalopram 20 MG tablet Commonly known as: LEXAPRO Take 20 mg by mouth at bedtime. Takes in the pm   fluticasone 50 MCG/ACT nasal spray Commonly known as: FLONASE Place 2 sprays into both nostrils at bedtime.   nicotine 21 mg/24hr patch Commonly known as: NICODERM CQ - dosed in mg/24 hours Place 1 patch (21 mg total) onto the skin daily.   nitroGLYCERIN 0.4 MG SL tablet Commonly known as: NITROSTAT Place 0.4 mg under the tongue every 5 (five) minutes as needed for chest pain.   oxyCODONE 5 MG immediate release tablet Commonly known as: Oxy IR/ROXICODONE Take 1 tablet (5 mg total) by mouth every 6 (six) hours as needed for severe pain or moderate pain.   pantoprazole 40 MG tablet Commonly known as: PROTONIX Take 40 mg by mouth daily.   rosuvastatin 10 MG tablet Commonly known as: CRESTOR Take 10 mg by mouth at  bedtime. What changed: Another medication with the same name was removed. Continue taking this medication, and follow the directions you see here.   sodium chloride flush 0.9 % Soln Commonly known as: NS 10-40 mLs by Intracatheter route every 12 (twelve) hours.   triamcinolone cream 0.1 % Commonly known as: KENALOG Apply 1 application topically 2 (two) times daily. What changed:  when to take this reasons to take this       Allergies  Allergen Reactions   Clomiphene Citrate Nausea And Vomiting   Morphine And Related Other (See Comments)    PATIENT REFUSES THIS MEDICATION: states that she does not tolerate this medication well   Sulfa Antibiotics Other (See Comments)    Not sure ? rash   Clomiphene Rash      The results of significant diagnostics from this hospitalization (including imaging, microbiology, ancillary and laboratory) are listed below for reference.    Significant Diagnostic Studies: DG Chest 2 View  Result Date: 08/14/2021 CLINICAL DATA:  chest  pain EXAM: CHEST - 2 VIEW COMPARISON:  November 15, 2020. FINDINGS: The heart size and mediastinal contours are within normal limits. Both lungs are clear. No visible pleural effusions or pneumothorax. No acute osseous abnormality. Thoracic spine degenerative change IMPRESSION: No evidence of acute cardiopulmonary disease. Electronically Signed   By: Margaretha Sheffield M.D.   On: 08/14/2021 13:56   DG Thoracic Spine 2 View  Result Date: 08/14/2021 CLINICAL DATA:  Right sided back pain EXAM: THORACIC SPINE 2 VIEWS COMPARISON:  11/15/2020 FINDINGS: No evidence of acute fracture. No traumatic listhesis. Multilevel intervertebral disc height loss with endplate spurring throughout the thoracic spine, most pronounced within the midthoracic spine IMPRESSION: Mild to moderate multilevel thoracic spondylosis. No acute findings. Electronically Signed   By: Davina Poke D.O.   On: 08/14/2021 15:15   CT Angio Chest PE W and/or Wo  Contrast  Result Date: 08/14/2021 CLINICAL DATA:  Chest pain and shortness of breath EXAM: CT ANGIOGRAPHY CHEST WITH CONTRAST TECHNIQUE: Multidetector CT imaging of the chest was performed using the standard protocol during bolus administration of intravenous contrast. Multiplanar CT image reconstructions and MIPs were obtained to evaluate the vascular anatomy. CONTRAST:  170mL OMNIPAQUE IOHEXOL 350 MG/ML SOLN COMPARISON:  Chest x-ray from earlier in the same day, CT from 12/18/2019. FINDINGS: Cardiovascular: Thoracic aorta demonstrates atherosclerotic calcifications without aneurysmal dilatation or dissection. No cardiac enlargement is noted. Heavy coronary calcifications are noted. The pulmonary artery shows a normal branching pattern. No filling defect is identified to suggest pulmonary embolism. Mediastinum/Nodes: Thoracic inlet is within normal limits. No sizable hilar or mediastinal adenopathy is noted. The esophagus as visualized is within normal limits. Lungs/Pleura: There is a focal soft tissue lingular nodule identified which measures 16 by 8 by 6 mm in greatest dimension. This has increased in size from the prior exam at which time it measured approximately 10 mm in greatest dimension. Some subpleural scarring is noted. No other sizable parenchymal nodules are seen. Upper Abdomen: Visualized upper abdomen shows a tiny hypodensity within the right lobe of the liver best seen on image number 270 of series 5 likely representing a small cyst. This is stable from the prior exam. Musculoskeletal: Degenerative changes of the thoracic spine are noted. No acute rib abnormality is seen. Review of the MIP images confirms the above findings. IMPRESSION: No evidence of pulmonary emboli. Significant coronary atherosclerotic changes. Lingular nodule which measures 11 mm in mean diameter which has increased in size when compared with the prior exam. Consider one of the following in 3 months for both low-risk and  high-risk individuals: (a) repeat chest CT, (b) follow-up PET-CT, or (c) tissue sampling. This recommendation follows the consensus statement: Guidelines for Management of Incidental Pulmonary Nodules Detected on CT Images: From the Fleischner Society 2017; Radiology 2017; 284:228-243. Aortic Atherosclerosis (ICD10-I70.0). Electronically Signed   By: Inez Catalina M.D.   On: 08/14/2021 17:35   MR ABDOMEN W WO CONTRAST  Result Date: 08/20/2021 CLINICAL DATA:  55 year old female with history of fever and chills. Klebsiella positive blood cultures. Weakness and fatigue. Evaluate for possible hepatic abscess. Abnormal CT scan. EXAM: MRI ABDOMEN WITHOUT AND WITH CONTRAST TECHNIQUE: Multiplanar multisequence MR imaging of the abdomen was performed both before and after the administration of intravenous contrast. CONTRAST:  96mL GADAVIST GADOBUTROL 1 MMOL/ML IV SOLN COMPARISON:  CT of the abdomen and pelvis 08/18/2021. No prior abdominal MRI. FINDINGS: Lower chest: Unremarkable. Hepatobiliary: In the right lobe of the liver there are 3 small lesions which are T1  hypointense, T2 hyperintense and demonstrate some associated post gadolinium enhancement, largest of which is between segments 7 and 8 (axial image 20 of series 19) measuring 2.2 x 2.2 cm. These lesions demonstrate surrounding increased T2 signal intensity indicative of edema as well as a large degree of perilesional hyperenhancement during arterial phase and portal venous phase imaging, which normalizes on delayed imaging, such that delayed contrast enhancement is limited to the outer margin of the lesion. No intra or extrahepatic biliary ductal dilatation. Gallbladder is normal in appearance. Pancreas: No pancreatic mass. No pancreatic ductal dilatation. No pancreatic or peripancreatic fluid collections or inflammatory changes. Spleen:  Unremarkable. Adrenals/Urinary Tract: Bilateral kidneys and bilateral adrenal glands are normal in appearance. No  hydroureteronephrosis in the visualized portions of the abdomen. Bilateral adrenal glands are normal in appearance. Stomach/Bowel: Visualized portions are unremarkable. Vascular/Lymphatic: No aneurysm identified in the visualized abdominal vasculature. No lymphadenopathy noted in the abdomen. Other: No significant volume of ascites noted in the visualized portions of the peritoneal cavity. Musculoskeletal: No aggressive appearing osseous lesions are noted in the visualized portions of the skeleton. IMPRESSION: 1. Three hepatic lesions in the right lobe of the liver, with imaging characteristics most concerning for small hepatic abscesses, as detailed above. Electronically Signed   By: Vinnie Langton M.D.   On: 08/20/2021 08:34   CT ABDOMEN PELVIS W CONTRAST  Result Date: 08/18/2021 CLINICAL DATA:  Fever and chills with Klebsiella positive blood cultures. EXAM: CT ABDOMEN AND PELVIS WITH CONTRAST TECHNIQUE: Multidetector CT imaging of the abdomen and pelvis was performed using the standard protocol following bolus administration of intravenous contrast. CONTRAST:  153mL OMNIPAQUE 300 COMPARISON:  CT of the chest from 08/14/2021 FINDINGS: Lower chest: Lung bases are free of acute infiltrate or sizable effusion. Hepatobiliary: A previously seen hypodensity within the right lobe of the liver has increased in size now measuring almost 2 cm with some peripheral enhancement identified suspicious for small hepatic abscess. Second hypodensity is noted along the medial aspect of the right lobe of the liver best seen on image number 27 of series 3. The hypodense region measures approximately 10 mm in dimension. Gallbladder is within normal limits. Pancreas: Unremarkable. No pancreatic ductal dilatation or surrounding inflammatory changes. Spleen: Normal in size without focal abnormality. Adrenals/Urinary Tract: Adrenal glands are within normal limits. Kidneys demonstrate a normal enhancement pattern bilaterally. No  renal calculi or obstructive changes are noted. The bladder is partially distended. Stomach/Bowel: Colon shows scattered contrast material throughout colon. The appendix is within normal limits. Small bowel and stomach appear within normal limits. Vascular/Lymphatic: Aortic atherosclerosis. No enlarged abdominal or pelvic lymph nodes. Reproductive: Uterus and bilateral adnexa are unremarkable. Other: No abdominal wall hernia or abnormality. No abdominopelvic ascites. Musculoskeletal: Right hip replacement is noted. Degenerative changes of left hip are noted. Degenerative changes of lumbar spine are seen. IMPRESSION: Previously seen hypodensity within the right lobe of the liver which has been stable for over 1 year now demonstrates some slight increase in size with peripheral enhancement suspicious for hepatic abscess. This would correspond with the given clinical history. MRI with contrast is recommended for further evaluation. No other focal abnormality is noted. Electronically Signed   By: Inez Catalina M.D.   On: 08/18/2021 20:53   ECHOCARDIOGRAM COMPLETE  Result Date: 08/18/2021    ECHOCARDIOGRAM REPORT   Patient Name:   [name] Date of Exam: 08/18/2021 Medical Rec #:  [id number]     Height:       64.0 in Accession #:  [id number]    Weight:       230.0 lb Date of Birth:  28-Apr-1966     BSA:          2.076 m Patient Age:    18 years      BP:           136/76 mmHg Patient Gender: F             HR:           57 bpm. Exam Location:  Inpatient Procedure: 2D Echo, Color Doppler, Cardiac Doppler and Intracardiac            Opacification Agent Indications:    R07.9* Chest pain, unspecified  History:        Patient has prior history of Echocardiogram examinations, most                 recent 03/11/2018. CAD; Risk Factors:Hypertension, Dyslipidemia                 and Sleep Apnea.  Sonographer:    Raquel Sarna Senior RDCS Referring Phys: [place]  Sonographer Comments: Technically difficult due to poor  echo windows. IMPRESSIONS  1. Left ventricular ejection fraction, by estimation, is 60 to 65%. The left ventricle has normal function. The left ventricle has no regional wall motion abnormalities. Left ventricular diastolic parameters were normal.  2. Right ventricular systolic function is normal. The right ventricular size is normal.  3. The mitral valve is normal in structure. No evidence of mitral valve regurgitation. No evidence of mitral stenosis.  4. The aortic valve was not well visualized. Aortic valve regurgitation is not visualized. No aortic stenosis is present.  5. The inferior vena cava is normal in size with greater than 50% respiratory variability, suggesting right atrial pressure of 3 mmHg. FINDINGS  Left Ventricle: Left ventricular ejection fraction, by estimation, is 60 to 65%. The left ventricle has normal function. The left ventricle has no regional wall motion abnormalities. Definity contrast agent was given IV to delineate the left ventricular  endocardial borders. The left ventricular internal cavity size was normal in size. There is no left ventricular hypertrophy. Left ventricular diastolic parameters were normal. Right Ventricle: The right ventricular size is normal. Right vetricular wall thickness was not well visualized. Right ventricular systolic function is normal. Left Atrium: Left atrial size was normal in size. Right Atrium: Right atrial size was normal in size. Pericardium: There is no evidence of pericardial effusion. Mitral Valve: The mitral valve is normal in structure. No evidence of mitral valve regurgitation. No evidence of mitral valve stenosis. Tricuspid Valve: The tricuspid valve is normal in structure. Tricuspid valve regurgitation is trivial. No evidence of tricuspid stenosis. Aortic Valve: The aortic valve was not well visualized. Aortic valve regurgitation is not visualized. No aortic stenosis is present. Pulmonic Valve: The pulmonic valve was not well visualized.  Pulmonic valve regurgitation is not visualized. No evidence of pulmonic stenosis. Aorta: The aortic root is normal in size and structure. Venous: The inferior vena cava is normal in size with greater than 50% respiratory variability, suggesting right atrial pressure of 3 mmHg. IAS/Shunts: The interatrial septum was not well visualized.  LEFT VENTRICLE PLAX 2D LVIDd:         3.60 cm   Diastology LVIDs:         2.60 cm   LV e' medial:    7.83 cm/s LV PW:         0.90  cm   LV E/e' medial:  12.6 LV IVS:        1.20 cm   LV e' lateral:   13.10 cm/s LVOT diam:     1.90 cm   LV E/e' lateral: 7.5 LV SV:         66 LV SV Index:   32 LVOT Area:     2.84 cm  RIGHT VENTRICLE RV S prime:     9.68 cm/s TAPSE (M-mode): 2.3 cm LEFT ATRIUM             Index        RIGHT ATRIUM           Index LA diam:        3.30 cm 1.59 cm/m   RA Area:     14.70 cm LA Vol (A2C):   58.3 ml 28.08 ml/m  RA Volume:   32.70 ml  15.75 ml/m LA Vol (A4C):   42.5 ml 20.47 ml/m LA Biplane Vol: 49.6 ml 23.89 ml/m  AORTIC VALVE LVOT Vmax:   93.40 cm/s LVOT Vmean:  70.100 cm/s LVOT VTI:    0.233 m  AORTA Ao Root diam: 3.00 cm MITRAL VALVE MV Area (PHT): 2.70 cm    SHUNTS MV Decel Time: 281 msec    Systemic VTI:  0.23 m MV E velocity: 98.50 cm/s  Systemic Diam: 1.90 cm MV A velocity: 74.90 cm/s MV E/A ratio:  1.32 Kirk Ruths MD Electronically signed by Kirk Ruths MD Signature Date/Time: 08/18/2021/3:37:16 PM    Final    VAS US CAROTID  Result Date: 09/03/2021 Carotid Arterial Duplex Study Patient Name:  [name]  Date of Exam:   09/03/2021 Medical Rec #: [id number]      Accession #:    [id number] Date of Birth: 1965-10-20      Patient Gender: F Patient Age:   52 years Exam Location:  [facility] Procedure:      VAS US CAROTID Referring Phys: Charmaine Downs --------------------------------------------------------------------------------  Indications:       Carotid artery disease. Risk Factors:      Hypertension, hyperlipidemia,  current smoker, prior MI,                    coronary artery disease. Comparison Study:  11/26/2020 carotid artery duplex performed at outside                    facility- Right ICA 60-79% stenosis, left ICA chronic                    occlusion. Performing Technologist: Maudry Mayhew MHA, RDMS, RVT, RDCS  Examination Guidelines: A complete evaluation includes B-mode imaging, spectral Doppler, color Doppler, and power Doppler as needed of all accessible portions of each vessel. Bilateral testing is considered an integral part of a complete examination. Limited examinations for reoccurring indications may be performed as noted.  Right Carotid Findings: +----------+--------+--------+--------+------------------+---------+           PSV cm/sEDV cm/sStenosisPlaque DescriptionComments  +----------+--------+--------+--------+------------------+---------+ CCA Prox  108     42                                          +----------+--------+--------+--------+------------------+---------+ CCA Distal96      37                                          +----------+--------+--------+--------+------------------+---------+  ICA Prox  263     122             calcific          Shadowing +----------+--------+--------+--------+------------------+---------+ ICA Mid   167     55                                          +----------+--------+--------+--------+------------------+---------+ ICA Distal118     62                                          +----------+--------+--------+--------+------------------+---------+ ECA       46      8               calcific          shadowing +----------+--------+--------+--------+------------------+---------+ +----------+--------+-------+----------------+-------------------+           PSV cm/sEDV cmsDescribe        Arm Pressure (mmHG) +----------+--------+-------+----------------+-------------------+ Subclavian132            Multiphasic, WNL                     +----------+--------+-------+----------------+-------------------+ +---------+--------+--+--------+--+---------+ VertebralPSV cm/s69EDV cm/s22Antegrade +---------+--------+--+--------+--+---------+  Left Carotid Findings: +----------+--------+--------+--------+-------------------------------+--------+           PSV cm/sEDV cm/sStenosisPlaque Description             Comments +----------+--------+--------+--------+-------------------------------+--------+ CCA Prox  111     10                                                      +----------+--------+--------+--------+-------------------------------+--------+ CCA Distal40      8               heterogenous, calcific and                                                focal                                   +----------+--------+--------+--------+-------------------------------+--------+ ICA Prox  22              Occludedheterogenous, hyperechoic and                                             irregular                               +----------+--------+--------+--------+-------------------------------+--------+ ICA Distal                Occluded                                        +----------+--------+--------+--------+-------------------------------+--------+ ECA  87      15              heterogenous and smooth                 +----------+--------+--------+--------+-------------------------------+--------+ +----------+--------+--------+----------------+-------------------+           PSV cm/sEDV cm/sDescribe        Arm Pressure (mmHG) +----------+--------+--------+----------------+-------------------+ [id number]             Multiphasic, WNL                    +----------+--------+--------+----------------+-------------------+ +---------+--------+--+--------+--+---------+ VertebralPSV cm/s50EDV cm/s22Antegrade +---------+--------+--+--------+--+---------+   Summary:  Right Carotid: Velocities in the right ICA are consistent with a 80-99%                stenosis. Left Carotid: Evidence consistent with a total occlusion of the left ICA. Vertebrals:  Bilateral vertebral arteries demonstrate antegrade flow. Subclavians: Normal flow hemodynamics were seen in bilateral subclavian              arteries. *See table(s) above for measurements and observations.  Electronically signed by Deitra Mayo MD on 09/03/2021 at 6:31:18 PM.    Final    UE VENOUS DUPLEX (7am - 7pm)  Result Date: 09/03/2021 UPPER VENOUS STUDY  Patient Name:  [name]  Date of Exam:   09/03/2021 Medical Rec #: [id number]      Accession #:    [id number] Date of Birth: Apr 14, 1966      Patient Gender: F Patient Age:   46 years Exam Location:  [facility] Procedure:      VAS [place] UPPER EXTREMITY VENOUS DUPLEX Referring Phys: Charmaine Downs --------------------------------------------------------------------------------  Indications: Edema, and RUE PICC line Limitations: Bandages. Comparison Study: No prior study Performing Technologist: Maudry Mayhew MHA, RDMS, RVT, RDCS  Examination Guidelines: A complete evaluation includes B-mode imaging, spectral Doppler, color Doppler, and power Doppler as needed of all accessible portions of each vessel. Bilateral testing is considered an integral part of a complete examination. Limited examinations for reoccurring indications may be performed as noted.  Right Findings: +----------+------------+---------+-----------+----------+-------+ RIGHT     CompressiblePhasicitySpontaneousPropertiesSummary +----------+------------+---------+-----------+----------+-------+ IJV           Full                 Yes                      +----------+------------+---------+-----------+----------+-------+ Subclavian    Full                 Yes                      +----------+------------+---------+-----------+----------+-------+ Axillary      None                  No                Acute  +----------+------------+---------+-----------+----------+-------+ Brachial      None                 No                Acute  +----------+------------+---------+-----------+----------+-------+ Radial        Full                                          +----------+------------+---------+-----------+----------+-------+ Ulnar  Full                                          +----------+------------+---------+-----------+----------+-------+ Cephalic      Full                                          +----------+------------+---------+-----------+----------+-------+ Basilic       None                                   Acute  +----------+------------+---------+-----------+----------+-------+  Left Findings: +----------+------------+---------+-----------+----------+-------+ LEFT      CompressiblePhasicitySpontaneousPropertiesSummary +----------+------------+---------+-----------+----------+-------+ Subclavian               Yes       Yes                      +----------+------------+---------+-----------+----------+-------+  Summary:  Right: Findings consistent with acute deep vein thrombosis involving the right axillary vein and right brachial veins and superficial vein thrombosis involving the right basilic vein, all surrounding the PICC line.  Left: No evidence of thrombosis in the subclavian.  *See table(s) above for measurements and observations.  Diagnosing physician: Deitra Mayo MD Electronically signed by Deitra Mayo MD on 09/03/2021 at 6:31:38 PM.    Final    [place] EKG SITE RITE  Result Date: 08/20/2021 If Site Rite image not attached, placement could not be confirmed due to current cardiac rhythm.   Microbiology: Recent Results (from the past 240 hour(s))  Resp Panel by RT-PCR (Flu A&B, Covid) Nasopharyngeal Swab     Status: None   Collection Time: 09/03/21  7:24 PM   Specimen: Nasopharyngeal Swab;  Nasopharyngeal(NP) swabs in vial transport medium  Result Value Ref Range Status   SARS Coronavirus 2 by RT PCR NEGATIVE NEGATIVE Final    Comment: (NOTE) SARS-CoV-2 target nucleic acids are NOT DETECTED.  The SARS-CoV-2 RNA is generally detectable in upper respiratory specimens during the acute phase of infection. The lowest concentration of SARS-CoV-2 viral copies this assay can detect is 138 copies/mL. A negative result does not preclude SARS-Cov-2 infection and should not be used as the sole basis for treatment or other patient management decisions. A negative result may occur with  improper specimen collection/handling, submission of specimen other than nasopharyngeal swab, presence of viral mutation(s) within the areas targeted by this assay, and inadequate number of viral copies(<138 copies/mL). A negative result must be combined with clinical observations, patient history, and epidemiological information. The expected result is Negative.  Fact Sheet for Patients:  EntrepreneurPulse.com.au  Fact Sheet for Healthcare Providers:  IncredibleEmployment.be  This test is no t yet approved or cleared by the [place] FDA and  has been authorized for detection and/or diagnosis of SARS-CoV-2 by FDA under an Emergency Use Authorization (EUA). This EUA will remain  in effect (meaning this test can be used) for the duration of the COVID-19 declaration under Section 564(b)(1) of the Act, 21 U.S.C.section 360bbb-3(b)(1), unless the authorization is terminated  or revoked sooner.       Influenza A by PCR NEGATIVE NEGATIVE Final   Influenza B by PCR NEGATIVE NEGATIVE Final    Comment: (NOTE) The Xpert Xpress SARS-CoV-2/FLU/RSV plus assay is intended as  an aid in the diagnosis of influenza from Nasopharyngeal swab specimens and should not be used as a sole basis for treatment. Nasal washings and aspirates are unacceptable for Xpert Xpress  SARS-CoV-2/FLU/RSV testing.  Fact Sheet for Patients: EntrepreneurPulse.com.au  Fact Sheet for Healthcare Providers: IncredibleEmployment.be  This test is not yet approved or cleared by the [place] FDA and has been authorized for detection and/or diagnosis of SARS-CoV-2 by FDA under an Emergency Use Authorization (EUA). This EUA will remain in effect (meaning this test can be used) for the duration of the COVID-19 declaration under Section 564(b)(1) of the Act, 21 U.S.C. section 360bbb-3(b)(1), unless the authorization is terminated or revoked.  Performed at [facility], [place] [address], [place], [place] [address]      Labs: Basic Metabolic Panel: Recent Labs  Lab 09/03/21 1518 09/04/21 1148 09/05/21 0125  NA 138 136 134*  K 3.9 3.8 3.8  CL 105 102 104  CO2 24 23 22   GLUCOSE 99 101* 111*  BUN 6 9 11   CREATININE 0.79 0.88 0.88  CALCIUM 8.9 8.5* 8.4*   Liver Function Tests: Recent Labs  Lab 09/05/21 0125  AST 16  ALT 20  ALKPHOS 55  BILITOT 0.1*  PROT 5.8*  ALBUMIN 2.8*   No results for input(s): LIPASE, AMYLASE in the last 168 hours. No results for input(s): AMMONIA in the last 168 hours. CBC: Recent Labs  Lab 09/03/21 1518 09/04/21 1148 09/05/21 0125  WBC 6.2 5.0 5.0  NEUTROABS 3.2  --   --   HGB 14.5 13.1 12.2  HCT 44.7 41.3 38.5  MCV 89.9 90.4 90.4  PLT 145* 112* 105*   Cardiac Enzymes: No results for input(s): CKTOTAL, CKMB, CKMBINDEX, TROPONINI in the last 168 hours. BNP: BNP (last 3 results) No results for input(s): BNP in the last 8760 hours.  ProBNP (last 3 results) No results for input(s): PROBNP in the last 8760 hours.  CBG: No results for input(s): GLUCAP in the last 168 hours.     Signed:  Oswald Hillock MD.  [facility] 09/05/2021, 1:35 PM

## 2021-09-05 NOTE — Consult Note (Signed)
[place]    Date of Admission:  09/03/2021   Total days of inpatient antibiotics 2        Reason for Consult: hepatic abscess    Principal Problem:   DVT (deep venous thrombosis) (HCC) Active Problems:   Coronary artery disease   Mixed hyperlipidemia   Essential hypertension   GERD (gastroesophageal reflux disease)   Chronic diastolic HF (heart failure) (HCC)   Anxiety   Bacteremia due to Klebsiella pneumoniae   Assessment: 89 YF with Hx of klebsiella bacteremia 2/2 hepatic abscess on 4 weeks of ceftriaxone and metronidazole(end date 09/14/21) admitted for right upper extremity DVT at PICC line site. ID consulted as she has about 9 days left on her IV antibiotic course.    #Klebsiella bacteremia 2/2 hepatic abscesses without drainage #DVT surrounding PICC site SP removal -On prior admission plan was to complete 4 weeks of parenteral therapy followed by PO Augmentin. Unfortunately, she ended up having a DVT at PICC line site. 4 weeks of parenteral therapy would be ideal but given her DVT would not place another line. She is about 9 days short of completing 4 weeks of IV therapy as such transition to Augmentin with close follow-up with ID. Additionally, she does not want to stay inpatient/facility to complete antibiotics. Without a PICC line we cannot offer daily antibiotics as there is a weekend remaining.  Recommendations:  -Continue  ceftriaxone and metronidazole while inpatient(today) -Start Augmentin 875-125 mg PO bid tomorrow to complete 6 weeks of antibiotics -Pt will be on anticoagulation x 3 months -Pt scheduled to follow-up with Dr. Tommy Medal on 09/09/21  Microbiology:   Antibiotics: Ceftriaxone 11/22-p Metronidazole 11/22-p  Cultures: Blood 11/18 Klebsiella pneumoniae    HPI: Shannon [name] is a 55 y.o. female with hepatic abscess 2/2 klebsiella bacteremia on 4 week of ceftriaxone(end date 09/14/21), CAD, HTN, asthma, anxiety  presented with difficulty flushing her PICC line and right upper extremity swelling which started 1 day prior to admission. In the ED her vitals were stable. Ultrasound showed DVT in the right axillary, brachial and basilic vein surrounding PICC line. PICC was removed. ID consulted as pt had not completed IV antibiotic course and she no longer had a PICC line. Today, pt reports feeling well. She denies abdominal pain, weakness and fatigue. We discussed options for her antibiotics and she reported Augmentin before.    Review of Systems: Review of Systems  All other systems reviewed and are negative.  Past Medical History:  Diagnosis Date   Anxiety    Arthritis    oa needs hip replacement on right   Asthma    allergies    Cancer ([place])    squamous cell areas removed from vulva and pre caner areas removed from leg    Carotid artery occlusion    Complication of anesthesia    major depression after general anesthesia   Coronary artery disease    LHC 11/25/12 90% stenosis mid LCx & otherwise nonobstructive dz w/ EF 60-65% S/p PTCA/DES to LCx   Depression    Dyspnea    with exertion    Elevated cholesterol    Exertional dyspnea    chronic   Fibromyalgia    GERD (gastroesophageal reflux disease)    History of cardiovascular stress test 06/2015   low risk   HPV in female    Hyperlipidemia    Hypertension    MI (myocardial infarction) ([place]) fe 17,  2014   Obesity    Pneumonia    hx of x 2 years ago    Polycystic ovary disease    Skin tear of upper arm without complication, left, sequela    small skin tear left upper arm no drainage for 1 week   Sleep apnea    mild no cpap    Tobacco abuse     Social History   Tobacco Use   Smoking status: Every Day    Packs/day: 2.00    Years: 40.00    Pack years: 80.00    Types: Cigarettes    Start date: 03/29/1979   Smokeless tobacco: Never   Tobacco comments:    Currently 2ppd  Vaping Use   Vaping Use: Never used  Substance Use  Topics   Alcohol use: Not Currently    Comment: rare   Drug use: No    Family History  Problem Relation Age of Onset   Depression Mother    Anxiety disorder Maternal Grandmother    Anxiety disorder Paternal Grandmother    OCD Paternal Grandmother    Depression Paternal Grandmother    Heart attack Father        Deceased, 58   Cerebral aneurysm Father    Heart disease Father    Scheduled Meds:  [START ON 09/06/2021] amoxicillin-clavulanate  1 tablet Oral Q12H   apixaban  10 mg Oral BID   Followed by   Derrill Memo ON 09/12/2021] apixaban  5 mg Oral BID   aspirin EC  81 mg Oral Daily   bisoprolol-hydrochlorothiazide  1 tablet Oral Daily   escitalopram  20 mg Oral QHS   metroNIDAZOLE  500 mg Oral Q12H   pantoprazole  40 mg Oral Daily   rosuvastatin  20 mg Oral QHS   sodium chloride flush  3 mL Intravenous Q12H   Continuous Infusions:  cefTRIAXone (ROCEPHIN)  IV Stopped (09/04/21 2215)   PRN Meds:.acetaminophen **OR** acetaminophen, albuterol, ALPRAZolam, oxyCODONE Allergies  Allergen Reactions   Clomiphene Citrate Nausea And Vomiting   Morphine And Related Other (See Comments)    PATIENT REFUSES THIS MEDICATION: states that she does not tolerate this medication well   Sulfa Antibiotics Other (See Comments)    Not sure ? rash   Clomiphene Rash    OBJECTIVE: Blood pressure 112/71, pulse (!) 57, temperature 97.6 F (36.4 C), temperature source Oral, resp. rate 20, height 5\' 4"  (1.626 m), weight 106 kg, SpO2 99 %.  Physical Exam Constitutional:      Appearance: Normal appearance.  HENT:     Head: Normocephalic and atraumatic.     Right Ear: Tympanic membrane normal.     Left Ear: Tympanic membrane normal.     Nose: Nose normal.     Mouth/Throat:     Mouth: Mucous membranes are moist.  Eyes:     Extraocular Movements: Extraocular movements intact.     Conjunctiva/sclera: Conjunctivae normal.     Pupils: Pupils are equal, round, and reactive to light.  Cardiovascular:      Rate and Rhythm: Normal rate and regular rhythm.     Heart sounds: No murmur heard.   No friction rub. No gallop.  Pulmonary:     Effort: Pulmonary effort is normal.     Breath sounds: Normal breath sounds.  Abdominal:     General: Abdomen is flat.     Palpations: Abdomen is soft.  Musculoskeletal:        General: Normal range of motion.  Skin:  General: Skin is warm and dry.  Neurological:     General: No focal deficit present.     Mental Status: She is alert and oriented to person, place, and time.  Psychiatric:        Mood and Affect: Mood normal.    Lab Results Lab Results  Component Value Date   WBC 5.0 09/05/2021   HGB 12.2 09/05/2021   HCT 38.5 09/05/2021   MCV 90.4 09/05/2021   PLT 105 (L) 09/05/2021    Lab Results  Component Value Date   CREATININE 0.88 09/05/2021   BUN 11 09/05/2021   NA 134 (L) 09/05/2021   K 3.8 09/05/2021   CL 104 09/05/2021   CO2 22 09/05/2021    Lab Results  Component Value Date   ALT 20 09/05/2021   AST 16 09/05/2021   ALKPHOS 55 09/05/2021   BILITOT 0.1 (L) 09/05/2021       Laurice Record, [place] [facility] 09/05/2021, 3:11 PM

## 2021-09-05 NOTE — Progress Notes (Signed)
ANTICOAGULATION CONSULT NOTE - Follow Up Consult  Pharmacy Consult for heparin Indication: DVT  Labs: Recent Labs    09/03/21 1518 09/04/21 0300 09/04/21 1148 09/04/21 1429 09/04/21 2222  HGB 14.5  --  13.1  --   --   HCT 44.7  --  41.3  --   --   PLT 145*  --  112*  --   --   HEPARINUNFRC  --  0.78*  --  0.71* 0.78*  CREATININE 0.79  --  0.88  --   --     Assessment: 55yo female supratherapeutic on heparin with slightly higher level despite decreased rate; no infusion issues or signs of bleeding per RN.  Goal of Therapy:  Heparin level 0.3-0.7 units/ml   Plan:  Will decrease heparin infusion by 1 unit/kg/hr to 1400 units/hr and check level in 6 hours.    Wynona Neat, PharmD, BCPS  09/05/2021,12:07 AM

## 2021-09-09 ENCOUNTER — Other Ambulatory Visit: Payer: Self-pay

## 2021-09-09 ENCOUNTER — Ambulatory Visit: Payer: Medicare HMO | Admitting: Infectious Disease

## 2021-09-09 ENCOUNTER — Encounter: Payer: Self-pay | Admitting: Infectious Disease

## 2021-09-09 VITALS — BP 118/81 | HR 59 | Temp 96.4°F | Wt 230.0 lb

## 2021-09-09 DIAGNOSIS — D696 Thrombocytopenia, unspecified: Secondary | ICD-10-CM

## 2021-09-09 DIAGNOSIS — B961 Klebsiella pneumoniae [K. pneumoniae] as the cause of diseases classified elsewhere: Secondary | ICD-10-CM

## 2021-09-09 DIAGNOSIS — I82621 Acute embolism and thrombosis of deep veins of right upper extremity: Secondary | ICD-10-CM | POA: Diagnosis not present

## 2021-09-09 DIAGNOSIS — R7881 Bacteremia: Secondary | ICD-10-CM | POA: Diagnosis not present

## 2021-09-09 DIAGNOSIS — K750 Abscess of liver: Secondary | ICD-10-CM | POA: Diagnosis not present

## 2021-09-09 DIAGNOSIS — E669 Obesity, unspecified: Secondary | ICD-10-CM | POA: Diagnosis not present

## 2021-09-09 DIAGNOSIS — F17200 Nicotine dependence, unspecified, uncomplicated: Secondary | ICD-10-CM

## 2021-09-09 DIAGNOSIS — I25118 Atherosclerotic heart disease of native coronary artery with other forms of angina pectoris: Secondary | ICD-10-CM | POA: Diagnosis not present

## 2021-09-09 DIAGNOSIS — E66812 Obesity, class 2: Secondary | ICD-10-CM

## 2021-09-09 HISTORY — DX: Abscess of liver: K75.0

## 2021-09-09 MED ORDER — AMOXICILLIN-POT CLAVULANATE 875-125 MG PO TABS: 1.0000 | ORAL_TABLET | Freq: Two times a day (BID) | ORAL | 2 refills | Status: AC

## 2021-09-09 NOTE — Progress Notes (Signed)
Subjective:  Chief complaint: Concerned about how she will know when her infection has resolved  Patient ID: Shannon [name], female    DOB: [birth year], 55 y.o.   MRN: [id number]  HPI  Shannon [name] is a 55 year old Caucasian female with a history of coronary artery disease, obesity, looking anxiety osteoarthritis status post left-sided total hip arthroplasty who tells me she has had chronic abdominal discomfort that has not responded to proton pump therapy.  She said that she had wanted to have this worked up by GI last spring but her mother was dying from pancreatic cancer and this was taking up a great deal of her time so she could not seek much care for self.  The interim she was admitted to [facility] with Klebsiella pneumonia bacteremia with hepatic abscesses. The abscesses were too small to be drained by interventional radiology.  We elected to go with ceftriaxone intravenously along with oral metronidazole.  Unfortunately interim she developed extensive clot associated with her PICC line.  PICC line was removed and she is now on anticoagulation.  She was seen by my partner Dr. Candiss Norse who changed patient over to augmentin.  She is taking Augmentin though it does cause her some GI upset.    Past Medical History:  Diagnosis Date   Anxiety    Arthritis    oa needs hip replacement on right   Asthma    allergies    Cancer ([place])    squamous cell areas removed from vulva and pre caner areas removed from leg    Carotid artery occlusion    Complication of anesthesia    major depression after general anesthesia   Coronary artery disease    LHC 11/25/12 90% stenosis mid LCx & otherwise nonobstructive dz w/ EF 60-65% S/p PTCA/DES to LCx   Depression    Dyspnea    with exertion    Elevated cholesterol    Exertional dyspnea    chronic   Fibromyalgia    GERD (gastroesophageal reflux disease)    History of cardiovascular stress test 06/2015   low risk   HPV in female     Hyperlipidemia    Hypertension    MI (myocardial infarction) ([place]) fe 17, 2014   Obesity    Pneumonia    hx of x 2 years ago    Polycystic ovary disease    Skin tear of upper arm without complication, left, sequela    small skin tear left upper arm no drainage for 1 week   Sleep apnea    mild no cpap    Tobacco abuse     Past Surgical History:  Procedure Laterality Date   CORONARY ANGIOPLASTY WITH STENT PLACEMENT     LEFT HEART CATH AND CORONARY ANGIOGRAPHY N/A 08/09/2018   Procedure: LEFT HEART CATH AND CORONARY ANGIOGRAPHY;  Surgeon: Nelva Bush, MD;  Location: [facility];  Service: Cardiovascular;  Laterality: N/A;   LEFT HEART CATHETERIZATION WITH CORONARY ANGIOGRAM N/A 11/15/2012   Procedure: LEFT HEART CATHETERIZATION WITH CORONARY ANGIOGRAM;  Surgeon: Thayer Headings, MD;  Location: [facility] CATH LAB;  Service: Cardiovascular;  Laterality: N/A;   PERCUTANEOUS CORONARY STENT INTERVENTION (PCI-S)  11/15/2012   Procedure: PERCUTANEOUS CORONARY STENT INTERVENTION (PCI-S);  Surgeon: Thayer Headings, MD;  Location: [facility] CATH LAB;  Service: Cardiovascular;;   skin cancer removed right lower leg Right    TONSILLECTOMY     TOTAL HIP ARTHROPLASTY Right 01/16/2021   Procedure: TOTAL HIP ARTHROPLASTY ANTERIOR APPROACH;  Surgeon: Gaynelle Arabian, MD;  Location: [facility];  Service: Orthopedics;  Laterality: Right;  160min   uterus ablation     VULVECTOMY N/A 11/08/2019   Procedure: excision of VULVA, vulvoscopy;  Surgeon: Dian Queen, MD;  Location: [facility];  Service: Gynecology;  Laterality: N/A;    Family History  Problem Relation Age of Onset   Depression Mother    Anxiety disorder Maternal Grandmother    Anxiety disorder Paternal Grandmother    OCD Paternal Grandmother    Depression Paternal Grandmother    Heart attack Father        Deceased, 100   Cerebral aneurysm Father    Heart disease Father       Social History   Socioeconomic History   Marital  status: Legally Separated    Spouse name: Not on file   Number of children: Not on file   Years of education: Not on file   Highest education level: Not on file  Occupational History   Not on file  Tobacco Use   Smoking status: Every Day    Packs/day: 2.00    Years: 40.00    Pack years: 80.00    Types: Cigarettes    Start date: 03/29/1979   Smokeless tobacco: Never   Tobacco comments:    Currently 2ppd  Vaping Use   Vaping Use: Never used  Substance and Sexual Activity   Alcohol use: Not Currently    Comment: rare   Drug use: No   Sexual activity: Not Currently    Partners: Male  Other Topics Concern   Not on file  Social History Narrative   Lives with husband in a 3 story home.  Has no children.     On disability.     Education: high school, some college.   Social Determinants of Health   Financial Resource Strain: Not on file  Food Insecurity: Not on file  Transportation Needs: Not on file  Physical Activity: Not on file  Stress: Not on file  Social Connections: Not on file    Allergies  Allergen Reactions   Clomiphene Citrate Nausea And Vomiting   Morphine And Related Other (See Comments)    PATIENT REFUSES THIS MEDICATION: states that she does not tolerate this medication well   Sulfa Antibiotics Other (See Comments)    Not sure ? rash   Clomiphene Rash     Current Outpatient Medications:    albuterol (PROVENTIL HFA;VENTOLIN HFA) 108 (90 Base) MCG/ACT inhaler, Inhale 2 puffs into the lungs every 4 (four) hours as needed for wheezing or shortness of breath., Disp: 1 Inhaler, Rfl: 0   ALPRAZolam (XANAX) 1 MG tablet, Take 1 mg by mouth 2 (two) times daily as needed for anxiety., Disp: , Rfl: 2   amoxicillin-clavulanate (AUGMENTIN) 875-125 MG tablet, Take 1 tablet by mouth every 12 (twelve) hours., Disp: 28 tablet, Rfl: 0   apixaban (ELIQUIS) 5 MG TABS tablet, Take 2 tablets (10mg ) twice daily for 7 days , on 09/12/21 start taking 1 tablet (5mg ) twice daily for  at least 3 months, Disp: 74 tablet, Rfl: 3   aspirin EC 81 MG tablet, Take 1 tablet (81 mg total) by mouth daily. Swallow whole., Disp: 30 tablet, Rfl: 11   bisoprolol-hydrochlorothiazide (ZIAC) 5-6.25 MG tablet, Take 1 tablet by mouth daily. Pt takes in the pm, Disp: , Rfl:    escitalopram (LEXAPRO) 20 MG tablet, Take 20 mg by mouth at bedtime. Takes in the pm, Disp: , Rfl:  fluticasone (FLONASE) 50 MCG/ACT nasal spray, Place 2 sprays into both nostrils at bedtime., Disp: , Rfl:    nicotine (NICODERM CQ - DOSED IN MG/24 HOURS) 21 mg/24hr patch, Place 1 patch (21 mg total) onto the skin daily., Disp: 28 patch, Rfl: 0   nitroGLYCERIN (NITROSTAT) 0.4 MG SL tablet, Place 0.4 mg under the tongue every 5 (five) minutes as needed for chest pain., Disp: , Rfl:    oxyCODONE (OXY IR/ROXICODONE) 5 MG immediate release tablet, Take 1 tablet (5 mg total) by mouth every 6 (six) hours as needed for severe pain or moderate pain., Disp: 15 tablet, Rfl: 0   pantoprazole (PROTONIX) 40 MG tablet, Take 40 mg by mouth daily., Disp: , Rfl:    rosuvastatin (CRESTOR) 10 MG tablet, Take 10 mg by mouth at bedtime., Disp: , Rfl:    sodium chloride flush (NS) 0.9 % SOLN, 10-40 mLs by Intracatheter route every 12 (twelve) hours., Disp: 500 mL, Rfl: 0   triamcinolone cream (KENALOG) 0.1 %, Apply 1 application topically 2 (two) times daily. (Patient taking differently: Apply 1 application topically 2 (two) times daily as needed (irritation).), Disp: 454 g, Rfl: 0    Review of Systems  Constitutional:  Negative for activity change, appetite change, chills, diaphoresis, fatigue, fever and unexpected weight change.  HENT:  Negative for congestion, rhinorrhea, sinus pressure, sneezing, sore throat and trouble swallowing.   Eyes:  Negative for photophobia and visual disturbance.  Respiratory:  Negative for cough, chest tightness, shortness of breath, wheezing and stridor.   Cardiovascular:  Negative for chest pain, palpitations  and leg swelling.  Gastrointestinal:  Positive for abdominal pain. Negative for abdominal distention, anal bleeding, blood in stool, constipation, diarrhea, nausea and vomiting.  Genitourinary:  Negative for difficulty urinating, dysuria, flank pain and hematuria.  Musculoskeletal:  Negative for arthralgias, back pain, gait problem, joint swelling and myalgias.  Skin:  Negative for color change, pallor, rash and wound.  Neurological:  Negative for dizziness, tremors, weakness and light-headedness.  Hematological:  Negative for adenopathy. Does not bruise/bleed easily.  Psychiatric/Behavioral:  Negative for agitation, behavioral problems, confusion, decreased concentration, dysphoric mood and sleep disturbance. The patient is nervous/anxious.       Objective:   Physical Exam Constitutional:      General: She is not in acute distress.    Appearance: Normal appearance. She is well-developed. She is not ill-appearing or diaphoretic.  HENT:     Head: Normocephalic and atraumatic.     Right Ear: Hearing and external ear normal.     Left Ear: Hearing and external ear normal.     Nose: No nasal deformity or rhinorrhea.  Eyes:     General: No scleral icterus.    Conjunctiva/sclera: Conjunctivae normal.     Right eye: Right conjunctiva is not injected.     Left eye: Left conjunctiva is not injected.     Pupils: Pupils are equal, round, and reactive to light.  Neck:     Vascular: No JVD.  Cardiovascular:     Rate and Rhythm: Normal rate and regular rhythm.     Heart sounds: Normal heart sounds, S1 normal and S2 normal. No murmur heard.   No friction rub.  Abdominal:     General: There is no distension.     Palpations: Abdomen is soft. There is no mass.     Tenderness: There is no abdominal tenderness.  Musculoskeletal:        General: Normal range of motion.  Right shoulder: Normal.     Left shoulder: Normal.     Cervical back: Normal range of motion and neck supple.     Right hip:  Normal.     Left hip: Normal.     Right knee: Normal.     Left knee: Normal.  Lymphadenopathy:     Head:     Right side of head: No submandibular, preauricular or posterior auricular adenopathy.     Left side of head: No submandibular, preauricular or posterior auricular adenopathy.     Cervical: No cervical adenopathy.     Right cervical: No superficial or deep cervical adenopathy.    Left cervical: No superficial or deep cervical adenopathy.  Skin:    General: Skin is warm and dry.     Coloration: Skin is not pale.     Findings: No abrasion, bruising, ecchymosis, erythema, lesion or rash.     Nails: There is no clubbing.  Neurological:     General: No focal deficit present.     Mental Status: She is alert and oriented to person, place, and time.     Sensory: No sensory deficit.     Coordination: Coordination normal.     Gait: Gait normal.  Psychiatric:        Attention and Perception: Attention normal. She is attentive.        Mood and Affect: Mood is anxious.        Speech: Speech normal.        Behavior: Behavior normal. Behavior is cooperative.        Thought Content: Thought content normal.        Cognition and Memory: Cognition normal.        Judgment: Judgment normal.          Assessment & Plan:   Klebsiella pneumonia bacteremia with liver abscesses:  Fortunately his abscesses were small and would likely resolve with antibiotic therapy alone.  I  am going to continue her Augmentin and have her see Dr. Candiss Norse in January for followup before stopping antibiotics  I myself typically like to also have imaging done to prove the radiographic resolution of abscesses though this is not done by all in our group or in ID.   She would like assurance that this is resolved so in her situation it may be helpful also to get a CT scan.  Will hold on doing this until she follows up.  Source was likely gastrointestinal.  Abdominal pain has been chronic she does need to see  GI.  DVT on anticoagulation.  Smoking would benefit from smoking cessation.  Obesity eventually can work on weight reduction with primary care.  Thrombocytopenia: Have noted this this on her labs throughout those checked in 2022 and it was slightly low as well in 2021.  I am not sure what the etiology of this is.  She denies drinking alcohol she is not known to have cirrhosis.  She may benefit by being seen by hematologist oncologist--if this has not been worked up before. It may be and I may just not bwe able to find what has been done.  I spent 42  minutes with the patient including than 50% of the time in face to face counseling of the patient guards to nature of liver abscesses bacteremia sepsis, thrombocytopenia, personally reviewing her CT and MRI performed during the hospital stay her blood cultures, along with review of medical records in preparation for the visit and during the visit and in  coordination of her care.

## 2021-09-11 ENCOUNTER — Encounter: Payer: Self-pay | Admitting: Cardiology

## 2021-09-11 ENCOUNTER — Other Ambulatory Visit: Payer: Self-pay

## 2021-09-11 ENCOUNTER — Ambulatory Visit (INDEPENDENT_AMBULATORY_CARE_PROVIDER_SITE_OTHER): Payer: Medicare HMO | Admitting: Cardiology

## 2021-09-11 VITALS — BP 144/78 | HR 65 | Ht 64.5 in | Wt 228.4 lb

## 2021-09-11 DIAGNOSIS — I6523 Occlusion and stenosis of bilateral carotid arteries: Secondary | ICD-10-CM | POA: Diagnosis not present

## 2021-09-11 DIAGNOSIS — I10 Essential (primary) hypertension: Secondary | ICD-10-CM

## 2021-09-11 DIAGNOSIS — Z716 Tobacco abuse counseling: Secondary | ICD-10-CM | POA: Diagnosis not present

## 2021-09-11 DIAGNOSIS — I2510 Atherosclerotic heart disease of native coronary artery without angina pectoris: Secondary | ICD-10-CM

## 2021-09-11 DIAGNOSIS — Z789 Other specified health status: Secondary | ICD-10-CM | POA: Diagnosis not present

## 2021-09-11 DIAGNOSIS — E785 Hyperlipidemia, unspecified: Secondary | ICD-10-CM | POA: Diagnosis not present

## 2021-09-11 MED ORDER — EZETIMIBE 10 MG PO TABS: 10.0000 mg | ORAL_TABLET | Freq: Every day | ORAL | 3 refills | Status: DC

## 2021-09-11 NOTE — Patient Instructions (Signed)
Medication Instructions:  START Zetia 10 mg daily   Labwork: None today  Testing/Procedures: None today  Follow-Up: 6 months  Any Other Special Instructions Will Be Listed Below (If Applicable).    You have been referred to [facility] in [place]. They will call you for an appointment.   If you need a refill on your cardiac medications before your next appointment, please call your pharmacy.

## 2021-09-18 DIAGNOSIS — K750 Abscess of liver: Secondary | ICD-10-CM | POA: Diagnosis not present

## 2021-09-18 DIAGNOSIS — F17200 Nicotine dependence, unspecified, uncomplicated: Secondary | ICD-10-CM | POA: Diagnosis not present

## 2021-09-18 DIAGNOSIS — R7881 Bacteremia: Secondary | ICD-10-CM | POA: Diagnosis not present

## 2021-09-18 DIAGNOSIS — I82A21 Chronic embolism and thrombosis of right axillary vein: Secondary | ICD-10-CM | POA: Diagnosis not present

## 2021-09-18 DIAGNOSIS — R911 Solitary pulmonary nodule: Secondary | ICD-10-CM | POA: Diagnosis not present

## 2021-09-18 DIAGNOSIS — B961 Klebsiella pneumoniae [K. pneumoniae] as the cause of diseases classified elsewhere: Secondary | ICD-10-CM | POA: Diagnosis not present

## 2021-09-20 ENCOUNTER — Other Ambulatory Visit (HOSPITAL_COMMUNITY): Payer: Self-pay

## 2021-09-20 ENCOUNTER — Telehealth (HOSPITAL_COMMUNITY): Payer: Self-pay

## 2021-09-20 NOTE — Telephone Encounter (Signed)
Pharmacy Transitions of Care Follow-up Telephone Call  Date of discharge: 09/05/21  Discharge Diagnosis: DVT  How have you been since you were released from the hospital?  Patient has been well since discharge. Had question regarding duration of antibiotic. Medication was given for only 2 weeks but patient thought duration was 2 months. Gave her the number to [facility] to verify with MD. Patient hung up after getting the phone number.  Medication changes made at discharge:     START taking: Eliquis (apixaban)  CHANGE how you take: rosuvastatin (CRESTOR)  STOP taking: cefTRIAXone  IVPB (ROCEPHIN)  metroNIDAZOLE 500 MG tablet (Flagyl)   Medication changes verified by the patient? Yes    Medication Accessibility:  Home Pharmacy:  [facility]  Was the patient provided with refills on discharged medications? Yes   Have all prescriptions been transferred from [facility] to home pharmacy?  Yes  Is the patient able to afford medications? Has insurance    Medication Review:  Unable to discuss medication with patient as patient hung up before discussing blood thinner.   APIXABAN (ELIQUIS)  Apixaban 5 mg BID initiated on 09/05/21.  - Discussed importance of taking medication around the same time everyday  - Advised patient of medications to avoid (NSAIDs, ASA)  - Educated that Tylenol (acetaminophen) will be the preferred analgesic to prevent risk of bleeding  - Emphasized importance of monitoring for signs and symptoms of bleeding (abnormal bruising, prolonged bleeding, nose bleeds, bleeding from gums, discolored urine, black tarry stools)  - Advised patient to alert all providers of anticoagulation therapy prior to starting a new medication or having a procedure   Follow-up Appointments:  [facility] f/u appt confirmed?  Seen by cardiology on 09/11/21. Scheduled to see Dr. Candiss Norse on 10/16/21 @ 10:45am.   If their condition worsens, is the pt aware to call PCP or go to the  Emergency Dept.? yes  Final Patient Assessment: Patient has f/u scheduled and refills at home pharmacy

## 2021-09-25 ENCOUNTER — Ambulatory Visit
Admission: EM | Admit: 2021-09-25 | Discharge: 2021-09-25 | Disposition: A | Discharge status: Home / Self Care | Payer: Medicare HMO | Attending: Urgent Care | Admitting: Urgent Care

## 2021-09-25 ENCOUNTER — Ambulatory Visit (INDEPENDENT_AMBULATORY_CARE_PROVIDER_SITE_OTHER): Payer: Medicare HMO

## 2021-09-25 ENCOUNTER — Other Ambulatory Visit: Payer: Self-pay

## 2021-09-25 DIAGNOSIS — H6123 Impacted cerumen, bilateral: Secondary | ICD-10-CM | POA: Diagnosis not present

## 2021-09-25 DIAGNOSIS — H938X3 Other specified disorders of ear, bilateral: Secondary | ICD-10-CM | POA: Diagnosis not present

## 2021-09-25 DIAGNOSIS — Z1152 Encounter for screening for COVID-19: Secondary | ICD-10-CM | POA: Diagnosis not present

## 2021-09-25 DIAGNOSIS — F17200 Nicotine dependence, unspecified, uncomplicated: Secondary | ICD-10-CM | POA: Diagnosis not present

## 2021-09-25 DIAGNOSIS — H6983 Other specified disorders of Eustachian tube, bilateral: Secondary | ICD-10-CM | POA: Diagnosis not present

## 2021-09-25 DIAGNOSIS — J3089 Other allergic rhinitis: Secondary | ICD-10-CM

## 2021-09-25 DIAGNOSIS — J4540 Moderate persistent asthma, uncomplicated: Secondary | ICD-10-CM | POA: Diagnosis not present

## 2021-09-25 DIAGNOSIS — H6993 Unspecified Eustachian tube disorder, bilateral: Secondary | ICD-10-CM

## 2021-09-25 DIAGNOSIS — R059 Cough, unspecified: Secondary | ICD-10-CM | POA: Diagnosis not present

## 2021-09-25 DIAGNOSIS — R052 Subacute cough: Secondary | ICD-10-CM

## 2021-09-25 DIAGNOSIS — Z8679 Personal history of other diseases of the circulatory system: Secondary | ICD-10-CM

## 2021-09-25 IMAGING — DX DG CHEST 2V
2 series · 2 of 2 positions shown · non-contrast
Comparison: [DATE]

CLINICAL DATA: Cough and ear fullness.

EXAM:
CHEST - 2 VIEW

[chest pa]
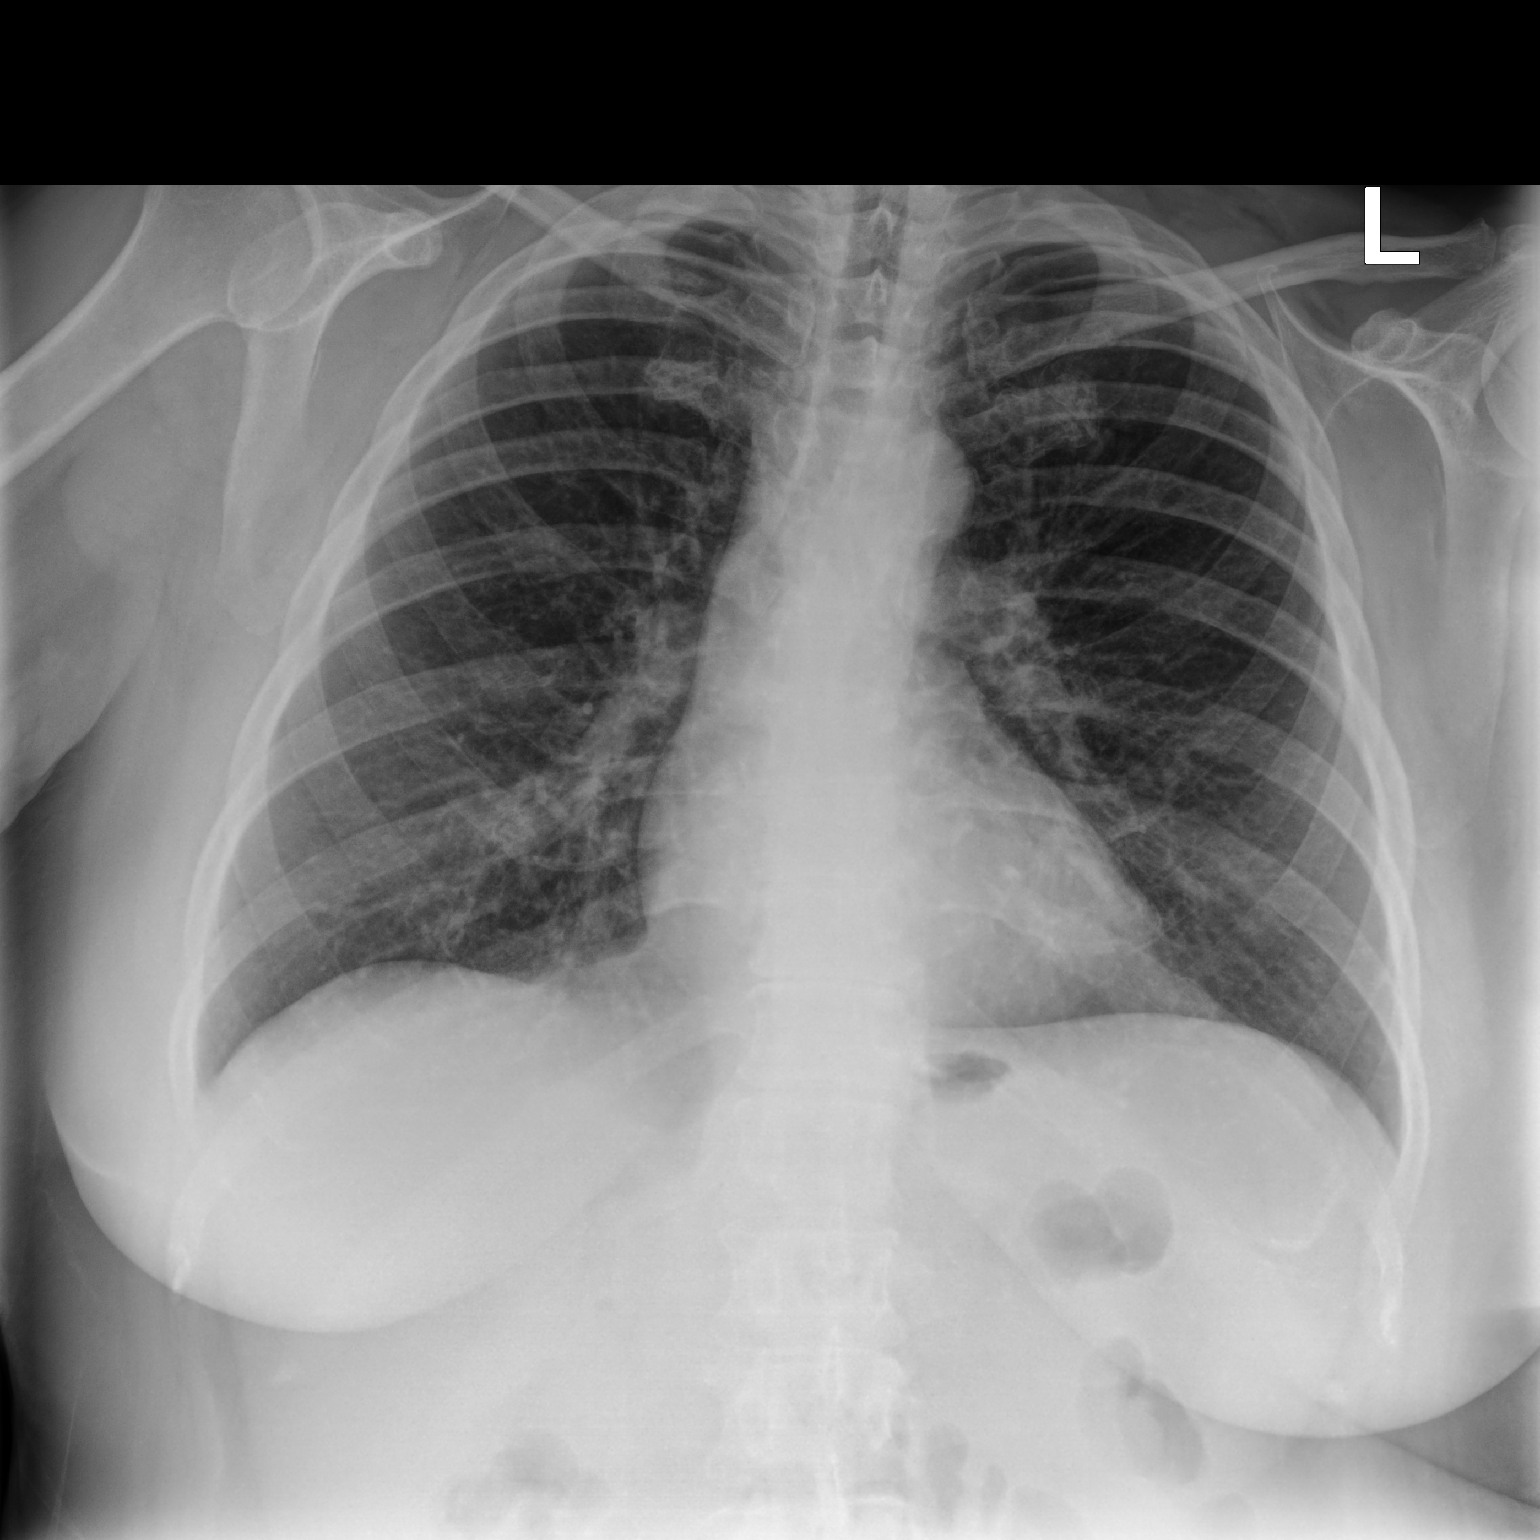

[chest lat]
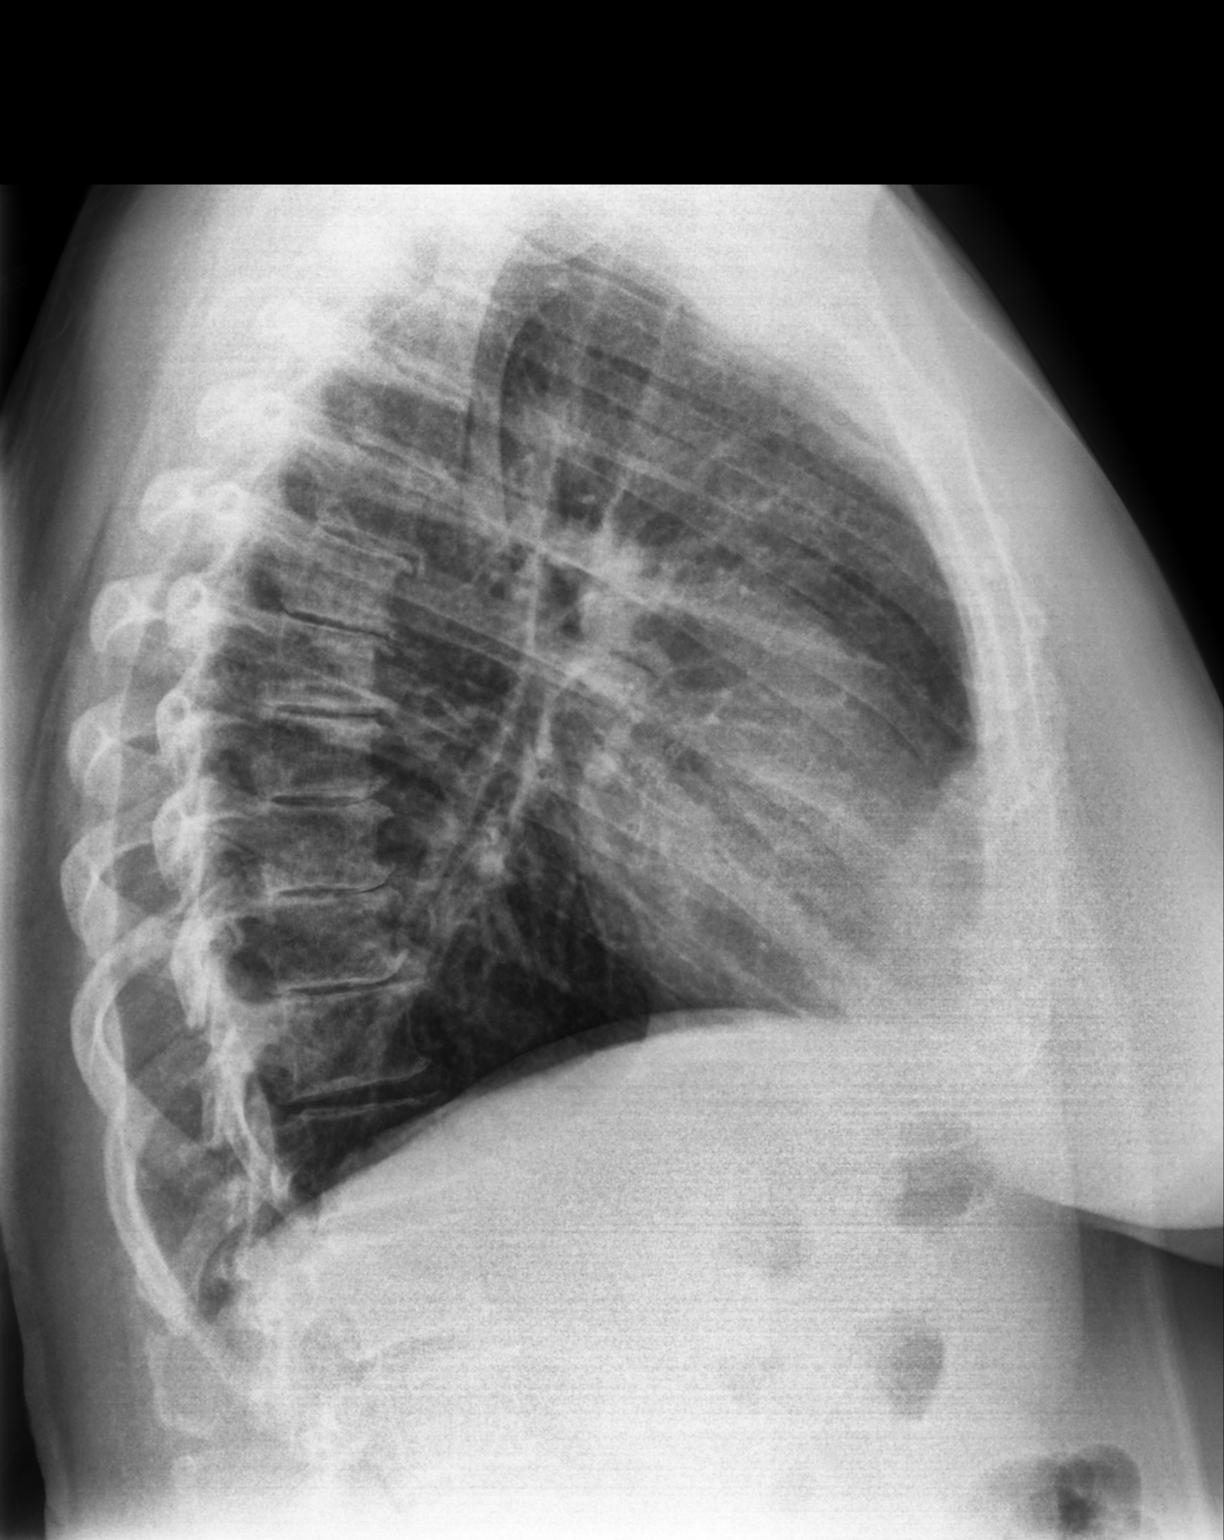

[2 of 2 positions shown; findings below may reference images not displayed]

FINDINGS: Both lungs are clear. Heart and mediastinum are within normal
limits. Degenerative endplate changes in the thoracic spine. No
pleural effusions. Trachea is midline. Negative for a pneumothorax.
IMPRESSION: No active cardiopulmonary disease.

## 2021-09-25 MED ORDER — CARBAMIDE PEROXIDE 6.5 % OT SOLN: 5.0000 [drp] | Freq: Two times a day (BID) | OTIC | 0 refills | Status: DC

## 2021-09-25 NOTE — ED Provider Notes (Signed)
[facility]   MRN: [id number] DOB: [birth year]  Subjective:   Shannon [name] is a 55 y.o. female presenting for 1.5-week history of persistent intermittent ear fullness bilaterally.  She is also had a persistent cough.  Has a history of allergic rhinitis and asthma.  She is a smoker, does 1.5ppd.  Was recently admitted to the hospital twice in November and then early December.  Refer to the notes from the hospitalizations for more detail.  Initially, patient had bacteremia due to Klebsiella pneumonia and then suffered a DVT.  She also has a history of chronic CHF, last echocardiogram from 08/18/2021 showed an ejection fraction of 60 to 65%.  No current facility-administered medications for this encounter.  Current Outpatient Medications:    albuterol (PROVENTIL HFA;VENTOLIN HFA) 108 (90 Base) MCG/ACT inhaler, Inhale 2 puffs into the lungs every 4 (four) hours as needed for wheezing or shortness of breath., Disp: 1 Inhaler, Rfl: 0   ALPRAZolam (XANAX) 1 MG tablet, Take 1 mg by mouth 2 (two) times daily as needed for anxiety., Disp: , Rfl: 2   amoxicillin-clavulanate (AUGMENTIN) 875-125 MG tablet, Take 1 tablet by mouth every 12 (twelve) hours., Disp: 60 tablet, Rfl: 2   apixaban (ELIQUIS) 5 MG TABS tablet, Take 2 tablets (10mg ) twice daily for 7 days , on 09/12/21 start taking 1 tablet (5mg ) twice daily for at least 3 months, Disp: 74 tablet, Rfl: 3   aspirin EC 81 MG tablet, Take 1 tablet (81 mg total) by mouth daily. Swallow whole., Disp: 30 tablet, Rfl: 11   bisoprolol-hydrochlorothiazide (ZIAC) 5-6.25 MG tablet, Take 1 tablet by mouth daily. Pt takes in the pm, Disp: , Rfl:    escitalopram (LEXAPRO) 20 MG tablet, Take 20 mg by mouth at bedtime. Takes in the pm, Disp: , Rfl:    ezetimibe (ZETIA) 10 MG tablet, Take 1 tablet (10 mg total) by mouth daily., Disp: 90 tablet, Rfl: 3   fluticasone (FLONASE) 50 MCG/ACT nasal spray, Place 2 sprays into both nostrils at bedtime., Disp: ,  Rfl:    nicotine (NICODERM CQ - DOSED IN MG/24 HOURS) 21 mg/24hr patch, Place 1 patch (21 mg total) onto the skin daily., Disp: 28 patch, Rfl: 0   nitroGLYCERIN (NITROSTAT) 0.4 MG SL tablet, Place 0.4 mg under the tongue every 5 (five) minutes as needed for chest pain., Disp: , Rfl:    oxyCODONE (OXY IR/ROXICODONE) 5 MG immediate release tablet, Take 1 tablet (5 mg total) by mouth every 6 (six) hours as needed for severe pain or moderate pain., Disp: 15 tablet, Rfl: 0   pantoprazole (PROTONIX) 40 MG tablet, Take 40 mg by mouth daily., Disp: , Rfl:    rosuvastatin (CRESTOR) 10 MG tablet, Take 10 mg by mouth at bedtime., Disp: , Rfl:    sodium chloride flush (NS) 0.9 % SOLN, 10-40 mLs by Intracatheter route every 12 (twelve) hours., Disp: 500 mL, Rfl: 0   triamcinolone cream (KENALOG) 0.1 %, Apply 1 application topically 2 (two) times daily. (Patient taking differently: Apply 1 application topically 2 (two) times daily as needed (irritation).), Disp: 454 g, Rfl: 0   Allergies  Allergen Reactions   Clomiphene Citrate Nausea And Vomiting   Morphine And Related Other (See Comments)    PATIENT REFUSES THIS MEDICATION: states that she does not tolerate this medication well   Sulfa Antibiotics Other (See Comments)    Not sure ? rash   Clomiphene Rash    Past Medical History:  Diagnosis Date  Anxiety    Arthritis    oa needs hip replacement on right   Asthma    allergies    Cancer (HCC)    squamous cell areas removed from vulva and pre caner areas removed from leg    Carotid artery occlusion    Complication of anesthesia    major depression after general anesthesia   Coronary artery disease    LHC 11/25/12 90% stenosis mid LCx & otherwise nonobstructive dz w/ EF 60-65% S/p PTCA/DES to LCx   Depression    Dyspnea    with exertion    Elevated cholesterol    Exertional dyspnea    chronic   Fibromyalgia    GERD (gastroesophageal reflux disease)    History of cardiovascular stress test  06/2015   low risk   HPV in female    Hyperlipidemia    Hypertension    Liver abscess 09/09/2021   MI (myocardial infarction) ([place]) fe 17, 2014   Obesity    Pneumonia    hx of x 2 years ago    Polycystic ovary disease    Skin tear of upper arm without complication, left, sequela    small skin tear left upper arm no drainage for 1 week   Sleep apnea    mild no cpap    Tobacco abuse      Past Surgical History:  Procedure Laterality Date   CORONARY ANGIOPLASTY WITH STENT PLACEMENT     LEFT HEART CATH AND CORONARY ANGIOGRAPHY N/A 08/09/2018   Procedure: LEFT HEART CATH AND CORONARY ANGIOGRAPHY;  Surgeon: Nelva Bush, MD;  Location: [facility];  Service: Cardiovascular;  Laterality: N/A;   LEFT HEART CATHETERIZATION WITH CORONARY ANGIOGRAM N/A 11/15/2012   Procedure: LEFT HEART CATHETERIZATION WITH CORONARY ANGIOGRAM;  Surgeon: Thayer Headings, MD;  Location: [facility] CATH LAB;  Service: Cardiovascular;  Laterality: N/A;   PERCUTANEOUS CORONARY STENT INTERVENTION (PCI-S)  11/15/2012   Procedure: PERCUTANEOUS CORONARY STENT INTERVENTION (PCI-S);  Surgeon: Thayer Headings, MD;  Location: [facility] CATH LAB;  Service: Cardiovascular;;   skin cancer removed right lower leg Right    TONSILLECTOMY     TOTAL HIP ARTHROPLASTY Right 01/16/2021   Procedure: TOTAL HIP ARTHROPLASTY ANTERIOR APPROACH;  Surgeon: Gaynelle Arabian, MD;  Location: [facility];  Service: Orthopedics;  Laterality: Right;  166min   uterus ablation     VULVECTOMY N/A 11/08/2019   Procedure: excision of VULVA, vulvoscopy;  Surgeon: Dian Queen, MD;  Location: [place];  Service: Gynecology;  Laterality: N/A;    Family History  Problem Relation Age of Onset   Depression Mother    Anxiety disorder Maternal Grandmother    Anxiety disorder Paternal Grandmother    OCD Paternal Grandmother    Depression Paternal Grandmother    Heart attack Father        Deceased, 54   Cerebral aneurysm Father    Heart disease  Father     Social History   Tobacco Use   Smoking status: Every Day    Packs/day: 2.00    Years: 40.00    Pack years: 80.00    Types: Cigarettes    Start date: 03/29/1979   Smokeless tobacco: Never   Tobacco comments:    Currently 2ppd  Vaping Use   Vaping Use: Never used  Substance Use Topics   Alcohol use: Not Currently    Comment: rare   Drug use: No    ROS   Objective:   Vitals: BP 117/81  Pulse 69    Temp 97.9 F (36.6 C)    Resp 20    SpO2 96%   Physical Exam Constitutional:      General: She is not in acute distress.    Appearance: Normal appearance. She is well-developed. She is not ill-appearing, toxic-appearing or diaphoretic.  HENT:     Head: Normocephalic and atraumatic.     Right Ear: There is impacted cerumen.     Left Ear: There is impacted cerumen.     Nose: Congestion present. No rhinorrhea.     Mouth/Throat:     Mouth: Mucous membranes are moist.  Eyes:     Extraocular Movements: Extraocular movements intact.     Pupils: Pupils are equal, round, and reactive to light.  Cardiovascular:     Rate and Rhythm: Normal rate and regular rhythm.     Pulses: Normal pulses.     Heart sounds: Normal heart sounds. No murmur heard.   No friction rub. No gallop.  Pulmonary:     Effort: Pulmonary effort is normal. No respiratory distress.     Breath sounds: Normal breath sounds. No stridor. No wheezing, rhonchi or rales.  Skin:    General: Skin is warm and dry.     Findings: No rash.  Neurological:     Mental Status: She is alert and oriented to person, place, and time.  Psychiatric:        Mood and Affect: Mood normal.        Behavior: Behavior normal.        Thought Content: Thought content normal.    DG Chest 2 View  Result Date: 09/25/2021 CLINICAL DATA:  Cough and ear fullness. EXAM: CHEST - 2 VIEW COMPARISON:  08/14/2021 FINDINGS: Both lungs are clear. Heart and mediastinum are within normal limits. Degenerative endplate changes in the  thoracic spine. No pleural effusions. Trachea is midline. Negative for a pneumothorax. IMPRESSION: No active cardiopulmonary disease. Electronically Signed   By: Markus Daft M.D.   On: 09/25/2021 17:04    Ear lavage performed using mixture of peroxide and water.  Pressure irrigation performed using a bottle and a thin ear tube.  Bilateral ear lavage.  Curette was used for manual removal.  Assessment and Plan :   PDMP not reviewed this encounter.  1. Bilateral impacted cerumen   2. Encounter for screening for COVID-19   3. Subacute cough   4. Ear fullness, bilateral   5. Eustachian tube dysfunction, bilateral   6. Heavy smoker   7. Allergic rhinitis due to other allergic trigger, unspecified seasonality   8. Moderate persistent asthma without complication   9. History of CHF (congestive heart failure)    TMs very clear after ear lavage.  Counseled against antibiotics as she has no signs of bacterial infection of the ears, chest x-ray is negative.  Recommended supportive care including continued use of Flonase, add Zyrtec.  For the cerumen impaction, recommended Debrox and follow-up with ENT.  Counseled patient on potential for adverse effects with medications prescribed/recommended today, ER and return-to-clinic precautions discussed, patient verbalized understanding.    Jaynee Eagles, PA-C 09/25/21 1745

## 2021-09-25 NOTE — ED Triage Notes (Signed)
Pt presents with cough and ear fullness that began a couple days ago, pt states she was recently discharged from hospital

## 2021-09-26 ENCOUNTER — Telehealth: Payer: Self-pay

## 2021-09-26 LAB — COVID-19, FLU A+B NAA
Influenza A, NAA: NOT DETECTED
Influenza B, NAA: NOT DETECTED
SARS-CoV-2, NAA: NOT DETECTED

## 2021-09-26 NOTE — Telephone Encounter (Signed)
Patient wanted to let our office know she missed 4 doses of her Augmentin due to misunderstanding with her pcp. She stated her pcp was going by her hospital discharge summary vs the office visit with Dr. Tommy Medal that stated continue Augmentin until follow up with our office in January with Dr. Candiss Norse. Patient has been advised to restart the Augmentin and come to follow up appointment.  Shannon [name]

## 2021-09-27 ENCOUNTER — Ambulatory Visit: Payer: Medicare HMO

## 2021-10-07 ENCOUNTER — Telehealth: Payer: Self-pay

## 2021-10-07 DIAGNOSIS — E782 Mixed hyperlipidemia: Secondary | ICD-10-CM

## 2021-10-07 NOTE — Telephone Encounter (Signed)
Called and lmom pt that they needed lipid and hepatic at least 2 days prior to pharmd visit

## 2021-10-08 DIAGNOSIS — J449 Chronic obstructive pulmonary disease, unspecified: Secondary | ICD-10-CM | POA: Diagnosis not present

## 2021-10-08 DIAGNOSIS — E782 Mixed hyperlipidemia: Secondary | ICD-10-CM | POA: Diagnosis not present

## 2021-10-08 DIAGNOSIS — I119 Hypertensive heart disease without heart failure: Secondary | ICD-10-CM | POA: Diagnosis not present

## 2021-10-08 DIAGNOSIS — I25119 Atherosclerotic heart disease of native coronary artery with unspecified angina pectoris: Secondary | ICD-10-CM | POA: Diagnosis not present

## 2021-10-08 DIAGNOSIS — F332 Major depressive disorder, recurrent severe without psychotic features: Secondary | ICD-10-CM | POA: Diagnosis not present

## 2021-10-08 DIAGNOSIS — I10 Essential (primary) hypertension: Secondary | ICD-10-CM | POA: Diagnosis not present

## 2021-10-08 DIAGNOSIS — J411 Mucopurulent chronic bronchitis: Secondary | ICD-10-CM | POA: Diagnosis not present

## 2021-10-16 ENCOUNTER — Encounter: Payer: Self-pay | Admitting: Internal Medicine

## 2021-10-16 ENCOUNTER — Other Ambulatory Visit: Payer: Self-pay

## 2021-10-16 ENCOUNTER — Ambulatory Visit: Payer: Medicare HMO | Admitting: Internal Medicine

## 2021-10-16 VITALS — BP 153/89 | HR 62 | Temp 97.5°F | Ht 64.0 in | Wt 232.0 lb

## 2021-10-16 DIAGNOSIS — K750 Abscess of liver: Secondary | ICD-10-CM

## 2021-10-16 LAB — COMPLETE METABOLIC PANEL WITH GFR
AG Ratio: 1.3 (calc) (ref 1.0–2.5)
ALT: 25 U/L (ref 6–29)
AST: 20 U/L (ref 10–35)
Albumin: 4 g/dL (ref 3.6–5.1)
Alkaline phosphatase (APISO): 75 U/L (ref 37–153)
BUN: 7 mg/dL (ref 7–25)
CO2: 29 mmol/L (ref 20–32)
Calcium: 9.1 mg/dL (ref 8.6–10.4)
Chloride: 102 mmol/L (ref 98–110)
Creat: 0.89 mg/dL (ref 0.50–1.03)
Globulin: 3.1 g/dL (calc) (ref 1.9–3.7)
Glucose, Bld: 79 mg/dL (ref 65–99)
Potassium: 3.9 mmol/L (ref 3.5–5.3)
Sodium: 137 mmol/L (ref 135–146)
Total Bilirubin: 0.3 mg/dL (ref 0.2–1.2)
Total Protein: 7.1 g/dL (ref 6.1–8.1)
eGFR: 76 mL/min/{1.73_m2} (ref >=60)

## 2021-10-16 LAB — CBC WITH DIFFERENTIAL/PLATELET
Absolute Monocytes: 442 cells/uL (ref 200–950)
Basophils Absolute: 39 cells/uL (ref 0–200)
Basophils Relative: 0.6 %
Eosinophils Absolute: 234 cells/uL (ref 15–500)
Eosinophils Relative: 3.6 %
HCT: 42.2 % (ref 35.0–45.0)
Hemoglobin: 14.1 g/dL (ref 11.7–15.5)
Lymphs Abs: 2509 cells/uL (ref 850–3900)
MCH: 28.9 pg (ref 27.0–33.0)
MCHC: 33.4 g/dL (ref 32.0–36.0)
MCV: 86.5 fL (ref 80.0–100.0)
MPV: 11.6 fL (ref 7.5–12.5)
Monocytes Relative: 6.8 %
Neutro Abs: 3276 cells/uL (ref 1500–7800)
Neutrophils Relative %: 50.4 %
Platelets: 176 10*3/uL (ref 140–400)
RBC: 4.88 10*6/uL (ref 3.80–5.10)
RDW: 13.8 % (ref 11.0–15.0)
Total Lymphocyte: 38.6 %
WBC: 6.5 10*3/uL (ref 3.8–10.8)

## 2021-10-16 MED ORDER — DIAZEPAM 10 MG PO TABS: ORAL_TABLET | ORAL | 0 refills | Status: DC

## 2021-10-16 NOTE — Progress Notes (Signed)
Patient Active Problem List   Diagnosis Date Noted   Liver abscess 09/09/2021   DVT (deep venous thrombosis) ([place]) 09/03/2021   Bacteremia due to Klebsiella pneumoniae 08/18/2021   Chest pain 08/18/2021   Atypical chest pain 08/17/2021   Hypotension 08/17/2021   Thrombocytopenia (HCC) 08/17/2021   Hyperglycemia 08/17/2021   Hyponatremia 08/17/2021   Hypokalemia 08/17/2021   Elevated troponin 08/17/2021   Obesity (BMI 30-39.9) 08/17/2021   Asthma 08/17/2021   Anxiety 08/17/2021   Primary osteoarthritis of right hip 01/16/2021   Chronic diastolic HF (heart failure) ([place]) 08/10/2018   GERD (gastroesophageal reflux disease) 08/09/2018   Unstable angina ([place]) 08/09/2018   Carotid artery disease ([place]) 07/24/2017   AAA (abdominal aortic aneurysm) without rupture 01/05/2013   Other malaise and fatigue 01/05/2013   Palpitations 01/05/2013   Tobacco use disorder 12/02/2012   Coronary artery disease    Mixed hyperlipidemia    Essential hypertension    SHORTNESS OF BREATH 09/04/2009   CHEST PAIN 09/04/2009    Patient's Medications  New Prescriptions   No medications on file  Previous Medications   ALBUTEROL (PROVENTIL HFA;VENTOLIN HFA) 108 (90 BASE) MCG/ACT INHALER    Inhale 2 puffs into the lungs every 4 (four) hours as needed for wheezing or shortness of breath.   ALPRAZOLAM (XANAX) 1 MG TABLET    Take 1 mg by mouth 2 (two) times daily as needed for anxiety.   APIXABAN (ELIQUIS) 5 MG TABS TABLET    Take 2 tablets (10mg ) twice daily for 7 days , on 09/12/21 start taking 1 tablet (5mg ) twice daily for at least 3 months   ASPIRIN EC 81 MG TABLET    Take 1 tablet (81 mg total) by mouth daily. Swallow whole.   BISOPROLOL-HYDROCHLOROTHIAZIDE (ZIAC) 5-6.25 MG TABLET    Take 1 tablet by mouth daily. Pt takes in the pm   CARBAMIDE PEROXIDE (DEBROX) 6.5 % OTIC SOLUTION    Place 5 drops into both ears 2 (two) times daily.   ESCITALOPRAM (LEXAPRO) 20 MG TABLET    Take 20 mg by  mouth at bedtime. Takes in the pm   EZETIMIBE (ZETIA) 10 MG TABLET    Take 1 tablet (10 mg total) by mouth daily.   FLUTICASONE (FLONASE) 50 MCG/ACT NASAL SPRAY    Place 2 sprays into both nostrils at bedtime.   NICOTINE (NICODERM CQ - DOSED IN MG/24 HOURS) 21 MG/24HR PATCH    Place 1 patch (21 mg total) onto the skin daily.   NITROGLYCERIN (NITROSTAT) 0.4 MG SL TABLET    Place 0.4 mg under the tongue every 5 (five) minutes as needed for chest pain.   OXYCODONE (OXY IR/ROXICODONE) 5 MG IMMEDIATE RELEASE TABLET    Take 1 tablet (5 mg total) by mouth every 6 (six) hours as needed for severe pain or moderate pain.   PANTOPRAZOLE (PROTONIX) 40 MG TABLET    Take 40 mg by mouth daily.   ROSUVASTATIN (CRESTOR) 10 MG TABLET    Take 10 mg by mouth at bedtime.   SODIUM CHLORIDE FLUSH (NS) 0.9 % SOLN    10-40 mLs by Intracatheter route every 12 (twelve) hours.   TRIAMCINOLONE CREAM (KENALOG) 0.1 %    Apply 1 application topically 2 (two) times daily.  Modified Medications   No medications on file  Discontinued Medications   No medications on file    HPI: 56 YF with CAD, obesity, anxiety on xanax, left hip arthroplasty ,  chronic abdominal discomfort presents for management of hepatic abscess. She was admitted to [place] 08/16/21 for Klebsiella bacteremia, and MRI showing three small hepatic abscesses. ID consulted and pt placed on 4 weeks of IV ceftriaxone and metronidazole(EOT 09/14/21).  She was re-admitted on 12/6 [place] due RUE DVT at [facility] line site ,place don anticoagulation. She was transitioned to PO antibiotics(Augmentin).  09/09/21: Seen by Dr. Tommy Medal, Augmentin continued with plan for repeat imaging.  Subjective: Reports ongoing abdominal pain, diffuse and soft stool. She reports symptoms started with antibiotics. Denies vomitng, fever, chills. Missed about one week of antibiotics about a month ago Review of Systems: Review of Systems  All other systems reviewed and are  negative.  Past Medical History:  Diagnosis Date   Anxiety    Arthritis    oa needs hip replacement on right   Asthma    allergies    Cancer (HCC)    squamous cell areas removed from vulva and pre caner areas removed from leg    Carotid artery occlusion    Complication of anesthesia    major depression after general anesthesia   Coronary artery disease    LHC 11/25/12 90% stenosis mid LCx & otherwise nonobstructive dz w/ EF 60-65% S/p PTCA/DES to LCx   Depression    Dyspnea    with exertion    Elevated cholesterol    Exertional dyspnea    chronic   Fibromyalgia    GERD (gastroesophageal reflux disease)    History of cardiovascular stress test 06/2015   low risk   HPV in female    Hyperlipidemia    Hypertension    Liver abscess 09/09/2021   MI (myocardial infarction) ([place]) fe 17, 2014   Obesity    Pneumonia    hx of x 2 years ago    Polycystic ovary disease    Skin tear of upper arm without complication, left, sequela    small skin tear left upper arm no drainage for 1 week   Sleep apnea    mild no cpap    Tobacco abuse     Social History   Tobacco Use   Smoking status: Every Day    Packs/day: 2.00    Years: 40.00    Pack years: 80.00    Types: Cigarettes    Start date: 03/29/1979   Smokeless tobacco: Never   Tobacco comments:    Currently 2ppd  Vaping Use   Vaping Use: Never used  Substance Use Topics   Alcohol use: Not Currently    Comment: rare   Drug use: No    Family History  Problem Relation Age of Onset   Depression Mother    Anxiety disorder Maternal Grandmother    Anxiety disorder Paternal Grandmother    OCD Paternal Grandmother    Depression Paternal Grandmother    Heart attack Father        Deceased, 7   Cerebral aneurysm Father    Heart disease Father     Allergies  Allergen Reactions   Clomiphene Citrate Nausea And Vomiting   Morphine And Related Other (See Comments)    PATIENT REFUSES THIS MEDICATION: states that she does not  tolerate this medication well   Sulfa Antibiotics Other (See Comments)    Not sure ? rash   Clomiphene Rash    Health Maintenance  Topic Date Due   Pneumococcal Vaccine 13-49 Years old (1 - PCV) Never done   Hepatitis C Screening  Never done  Zoster Vaccines- Shingrix (1 of 2) Never done   PAP SMEAR-Modifier  Never done   COLONOSCOPY (Pts 45-43yrs Insurance coverage will need to be confirmed)  Never done   MAMMOGRAM  03/08/2016   COVID-19 Vaccine (3 - Mixed Product risk series) 06/15/2020   INFLUENZA VACCINE  04/29/2021   TETANUS/TDAP  11/07/2024   HIV Screening  Completed   HPV VACCINES  Aged Out    Objective:  Vitals:   10/16/21 1054  BP: (!) 153/89  Pulse: 62  Temp: (!) 97.5 F (36.4 C)  TempSrc: Oral  SpO2: 98%  Weight: 232 lb (105.2 kg)  Height: 5\' 4"  (1.626 m)   Body mass index is 39.82 kg/m.  Physical Exam Constitutional:      Appearance: Normal appearance.  HENT:     Head: Normocephalic and atraumatic.     Right Ear: Tympanic membrane normal.     Left Ear: Tympanic membrane normal.     Nose: Nose normal.     Mouth/Throat:     Mouth: Mucous membranes are moist.  Eyes:     Extraocular Movements: Extraocular movements intact.     Conjunctiva/sclera: Conjunctivae normal.     Pupils: Pupils are equal, round, and reactive to light.  Cardiovascular:     Rate and Rhythm: Normal rate and regular rhythm.     Heart sounds: No murmur heard.   No friction rub. No gallop.  Pulmonary:     Effort: Pulmonary effort is normal.     Breath sounds: Normal breath sounds.  Abdominal:     General: Abdomen is flat.     Palpations: Abdomen is soft.  Musculoskeletal:        General: Normal range of motion.  Skin:    General: Skin is warm and dry.  Neurological:     General: No focal deficit present.     Mental Status: She is alert and oriented to person, place, and time.  Psychiatric:        Mood and Affect: Mood normal.    Lab Results Lab Results  Component  Value Date   WBC 5.0 09/05/2021   HGB 12.2 09/05/2021   HCT 38.5 09/05/2021   MCV 90.4 09/05/2021   PLT 105 (L) 09/05/2021    Lab Results  Component Value Date   CREATININE 0.88 09/05/2021   BUN 11 09/05/2021   NA 134 (L) 09/05/2021   K 3.8 09/05/2021   CL 104 09/05/2021   CO2 22 09/05/2021    Lab Results  Component Value Date   ALT 20 09/05/2021   AST 16 09/05/2021   ALKPHOS 55 09/05/2021   BILITOT 0.1 (L) 09/05/2021    Lab Results  Component Value Date   CHOL 194 08/10/2018   HDL 36 (L) 08/10/2018   LDLCALC 105 (H) 08/10/2018   LDLDIRECT 200.3 09/04/2009   TRIG 264 (H) 08/10/2018   CHOLHDL 5.4 08/10/2018   No results found for: LABRPR, RPRTITER No results found for: HIV1RNAQUANT, HIV1RNAVL, CD4TABS Assessment/Plan #Hepatic abscesses SP 8 weeks of antibiotics(7 weeks of antibiotics taken) #Hx of Klebsiella bacteremia 2/2 hepatic abscess 11/22-12/9 ceftriaxone and metronidazole->developed clot  at PICC and changed to PO antibiotics 12/9-1/18/23 Augmentin Suspect GI symptoms(non-specific abdominal pain with Hx of chronic abdominal discomfort and soft stools) may be 2/2 antibiotics.  Pt missed 1 week of Augmentin about a month ago Recommendations -Cbc, Cmp -ADD probiotics -Continue antibiotics until MRI results no evaluate hepatic lesions. Will call patient with the results. Pending MRI findings plan to stop  antibiotics as 7-8 week course should have appropriately treated hepatic abscess.  -Follow-up in 1 week -Refer to Heme-onc at next visit if thrombocytopenia has not worked up  -Follow up scheduled in 1 week. Laurice Record, MD [place] [facility] 10/16/2021, 10:55 AM

## 2021-10-17 ENCOUNTER — Telehealth: Payer: Self-pay

## 2021-10-17 ENCOUNTER — Other Ambulatory Visit: Payer: Medicare HMO

## 2021-10-17 NOTE — Telephone Encounter (Signed)
Patient left a message stating she was unable to pick up her Valium rx from the pharmacy to take prior to her MRI, so she had to cancel the appointment for the MRI today. I attempted to contact the patient to let her know the the RX was printed and was handed to her after her visit. Patient did not answer and I had to leave a message on a secured voicemail with the information. Also advising her to call the office back if she could not locate the Rx.  [name] T Brooks Sailors

## 2021-10-18 ENCOUNTER — Telehealth: Payer: Self-pay | Admitting: Internal Medicine

## 2021-10-18 ENCOUNTER — Other Ambulatory Visit: Payer: Self-pay

## 2021-10-18 MED ORDER — DIAZEPAM 5 MG PO TABS: ORAL_TABLET | ORAL | 0 refills | Status: DC

## 2021-10-18 NOTE — Telephone Encounter (Signed)
Rx prescription for Valium for prior to MRI, if pt does not have prescription written at last visit.

## 2021-10-21 NOTE — Telephone Encounter (Signed)
Rx has been printed and placed up front for the patient if she needs it.

## 2021-10-23 ENCOUNTER — Encounter: Payer: Self-pay | Admitting: Pharmacist

## 2021-10-23 ENCOUNTER — Ambulatory Visit: Payer: Medicare HMO

## 2021-10-23 NOTE — Progress Notes (Deleted)
Patient ID: Shannon [name]                 DOB: [birth year]                    MRN: [id number]     HPI: Shannon [name] is a 56 y.o. female patient referred to lipid clinic by Dr Johney Frame. PMH is significant for   Current Medications:  Intolerances:  Risk Factors:  LDL goal:   Diet:   Exercise:   Family History:   Social History:   Labs:  Past Medical History:  Diagnosis Date   Anxiety    Arthritis    oa needs hip replacement on right   Asthma    allergies    Cancer ([place])    squamous cell areas removed from vulva and pre caner areas removed from leg    Carotid artery occlusion    Complication of anesthesia    major depression after general anesthesia   Coronary artery disease    LHC 11/25/12 90% stenosis mid LCx & otherwise nonobstructive dz w/ EF 60-65% S/p PTCA/DES to LCx   Depression    Dyspnea    with exertion    Elevated cholesterol    Exertional dyspnea    chronic   Fibromyalgia    GERD (gastroesophageal reflux disease)    History of cardiovascular stress test 06/2015   low risk   HPV in female    Hyperlipidemia    Hypertension    Liver abscess 09/09/2021   MI (myocardial infarction) ([place]) fe 17, 2014   Obesity    Pneumonia    hx of x 2 years ago    Polycystic ovary disease    Skin tear of upper arm without complication, left, sequela    small skin tear left upper arm no drainage for 1 week   Sleep apnea    mild no cpap    Tobacco abuse     Current Outpatient Medications on File Prior to Visit  Medication Sig Dispense Refill   albuterol (PROVENTIL HFA;VENTOLIN HFA) 108 (90 Base) MCG/ACT inhaler Inhale 2 puffs into the lungs every 4 (four) hours as needed for wheezing or shortness of breath. 1 Inhaler 0   ALPRAZolam (XANAX) 1 MG tablet Take 1 mg by mouth 2 (two) times daily as needed for anxiety.  2   apixaban (ELIQUIS) 5 MG TABS tablet Take 2 tablets (10mg ) twice daily for 7 days , on 09/12/21 start taking 1 tablet (5mg ) twice daily for at least 3  months 74 tablet 3   aspirin EC 81 MG tablet Take 1 tablet (81 mg total) by mouth daily. Swallow whole. 30 tablet 11   bisoprolol-hydrochlorothiazide (ZIAC) 5-6.25 MG tablet Take 1 tablet by mouth daily. Pt takes in the pm     carbamide peroxide (DEBROX) 6.5 % OTIC solution Place 5 drops into both ears 2 (two) times daily. (Patient not taking: Reported on 10/16/2021) 15 mL 0   diazepam (VALIUM) 5 MG tablet Take 1 tab 30 min prior to MRI 1 tablet 0   escitalopram (LEXAPRO) 20 MG tablet Take 20 mg by mouth at bedtime. Takes in the pm     ezetimibe (ZETIA) 10 MG tablet Take 1 tablet (10 mg total) by mouth daily. (Patient not taking: Reported on 10/16/2021) 90 tablet 3   fluticasone (FLONASE) 50 MCG/ACT nasal spray Place 2 sprays into both nostrils at bedtime.     nicotine (NICODERM CQ - DOSED IN MG/24  HOURS) 21 mg/24hr patch Place 1 patch (21 mg total) onto the skin daily. (Patient not taking: Reported on 10/16/2021) 28 patch 0   nitroGLYCERIN (NITROSTAT) 0.4 MG SL tablet Place 0.4 mg under the tongue every 5 (five) minutes as needed for chest pain.     oxyCODONE (OXY IR/ROXICODONE) 5 MG immediate release tablet Take 1 tablet (5 mg total) by mouth every 6 (six) hours as needed for severe pain or moderate pain. 15 tablet 0   pantoprazole (PROTONIX) 40 MG tablet Take 40 mg by mouth daily.     rosuvastatin (CRESTOR) 10 MG tablet Take 10 mg by mouth at bedtime.     sodium chloride flush (NS) 0.9 % SOLN 10-40 mLs by Intracatheter route every 12 (twelve) hours. (Patient not taking: Reported on 10/16/2021) 500 mL 0   triamcinolone cream (KENALOG) 0.1 % Apply 1 application topically 2 (two) times daily. (Patient taking differently: Apply 1 application topically 2 (two) times daily as needed (irritation).) 454 g 0   No current facility-administered medications on file prior to visit.    Allergies  Allergen Reactions   Clomiphene Citrate Nausea And Vomiting   Doxycycline Other (See Comments)   Morphine And  Related Other (See Comments)    PATIENT REFUSES THIS MEDICATION: states that she does not tolerate this medication well   Other     General anesthesia causes depressive symptoms    Sulfa Antibiotics Other (See Comments)    Not sure ? rash   Clomiphene Rash    Assessment/Plan:  1. Hyperlipidemia -

## 2021-10-24 NOTE — Telephone Encounter (Signed)
This encounter was created in error - please disregard.

## 2021-10-29 ENCOUNTER — Telehealth: Payer: Self-pay

## 2021-10-29 NOTE — Telephone Encounter (Signed)
Patient made aware that her prescription has been faxed to the pharmacy. Shannon [name]

## 2021-10-30 ENCOUNTER — Other Ambulatory Visit: Payer: Self-pay

## 2021-10-30 ENCOUNTER — Ambulatory Visit
Admission: RE | Admit: 2021-10-30 | Discharge: 2021-10-30 | Disposition: A | Discharge status: Home / Self Care | Payer: Medicare HMO | Source: Ambulatory Visit | Attending: Internal Medicine | Admitting: Internal Medicine

## 2021-10-30 DIAGNOSIS — K750 Abscess of liver: Secondary | ICD-10-CM

## 2021-10-30 DIAGNOSIS — K820 Obstruction of gallbladder: Secondary | ICD-10-CM | POA: Diagnosis not present

## 2021-10-30 IMAGING — MR MR ABDOMEN WO/W CM
12 of 17 series · 26 of 48 positions shown · IV contrast (20 ml multihance)
Comparison: MRI abdomen [DATE]

CLINICAL DATA: Hepatic abscess follow-up

EXAM:
MRI ABDOMEN WITHOUT AND WITH CONTRAST
TECHNIQUE: Multiplanar multisequence MR imaging of the abdomen was performed
both before and after the administration of intravenous contrast.
CONTRAST:  20mL MULTIHANCE GADOBENATE DIMEGLUMINE 529 MG/ML IV SOLN

[Series 3: T2 · coronal · 5.0mm · 1.56mm/px · 1 of 32 slices shown (1 of 3)]
[im 1/32]
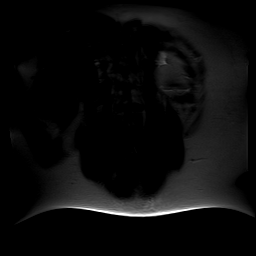

[Series 4: axial tru fisp · axial · 5.0mm · 1.48mm/px · 1 of 37 slices shown]
[im 1/37]
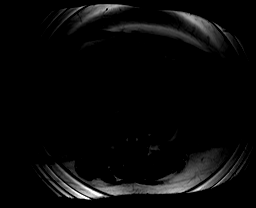

[Series 5: T2 · axial · 6.5mm · 0.78mm/px · 1 of 30 slices shown (2 of 3)]
[im 1/30]
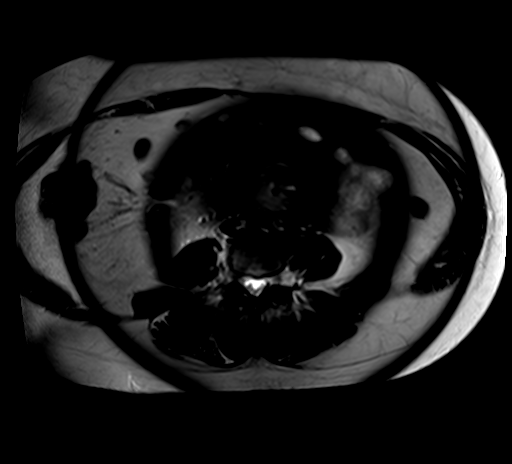

[Series 6: ep2d_diff_b50_500_800_p2 · axial · 6.0mm · 2.08mm/px · z∈[-98,+128]mm · 3 of 90 slices shown]
[im 1/90]
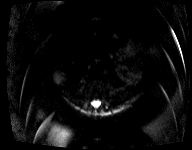
[im 45/90]
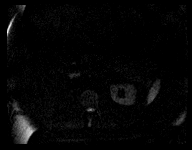
[im 90/90]
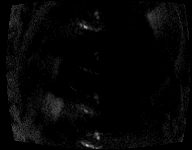

[Series 7: ep2d_diff_b50_500_800_p2_adc · axial · 6.0mm · 2.08mm/px · 1 of 30 slices shown]
[im 1/30]
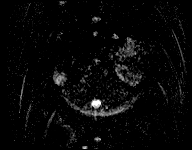

[Series 8: T2 · axial · 5.0mm · 1.56mm/px · 1 of 35 slices shown (3 of 3)]
[im 1/35]
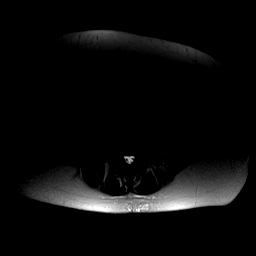

[Series 9: axial in out · axial · 6.0mm · 0.78mm/px · z∈[-125,+102]mm · 2 of 68 slices shown]
[im 1/68]
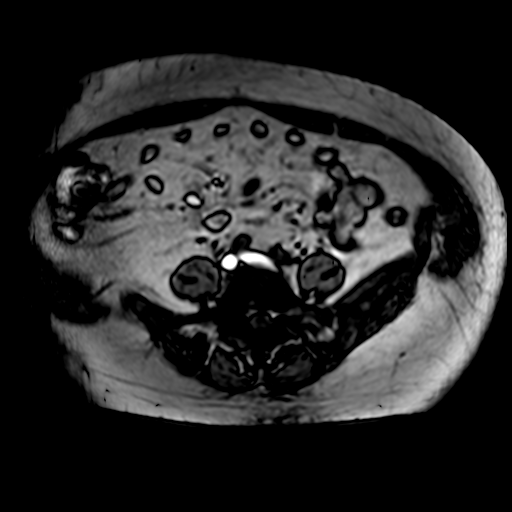
[im 68/68]
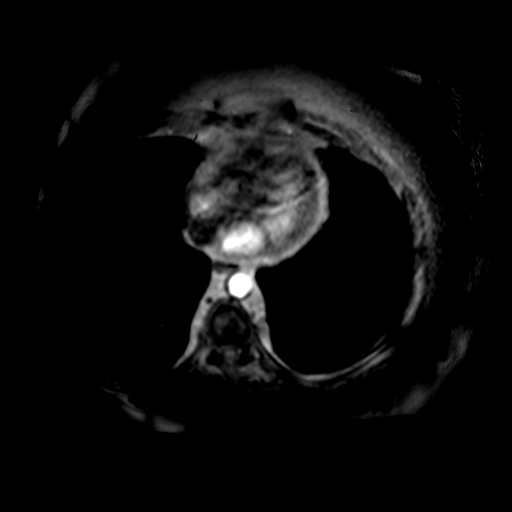

[Series 10: T1 dynamic · axial · non-contrast · 2.2mm · 0.78mm/px · z∈[-107,+84]mm · 3 of 88 slices shown]
[im 1/88]
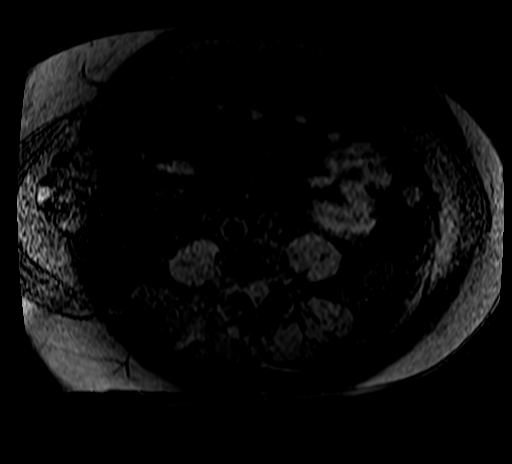
[im 44/88]
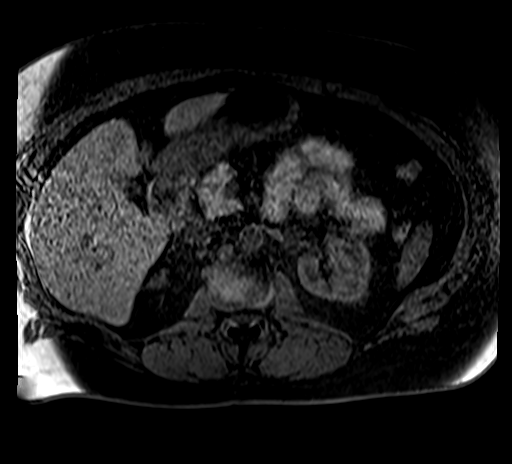
[im 88/88]
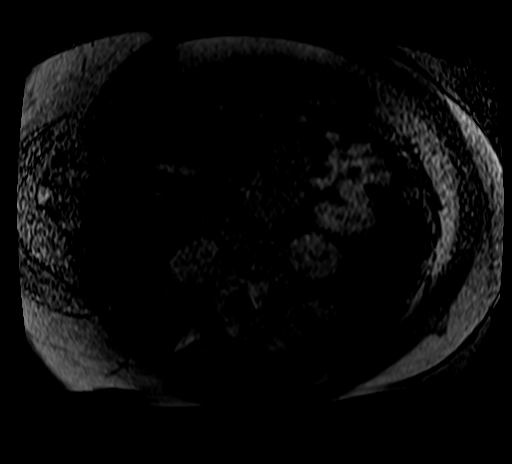

[Series 11: post 25 sec · axial · 2.2mm · 0.78mm/px · z∈[-107,+84]mm · 4 of 88 slices shown]
[im 1/88]
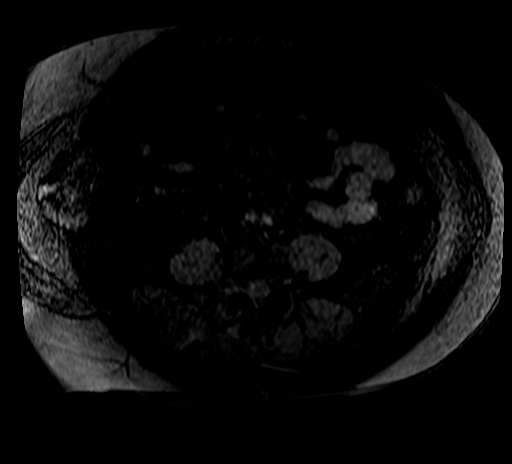
[im 30/88]
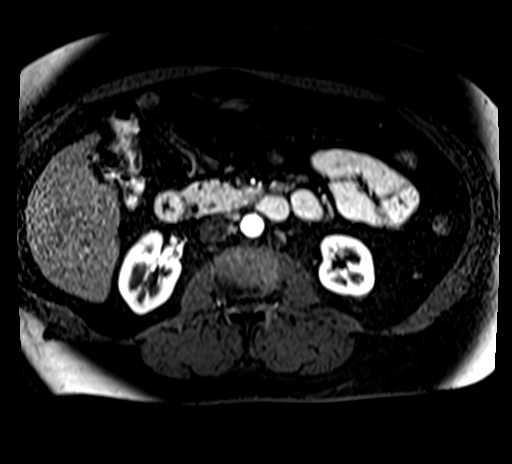
[im 59/88]
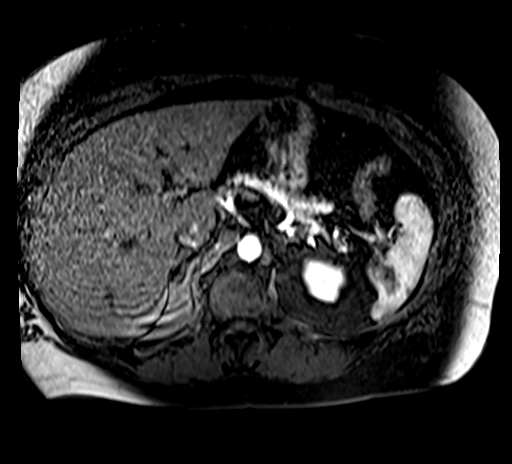
[im 88/88]
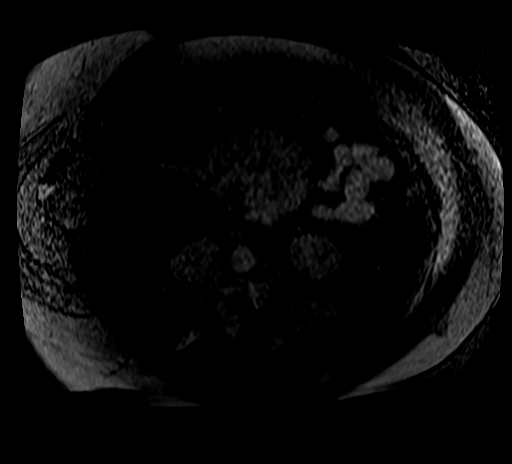

[Series 12: post 25 sec_sub · axial · 2.2mm · 0.78mm/px · z∈[-107,+84]mm · 4 of 88 slices shown]
[im 1/88]
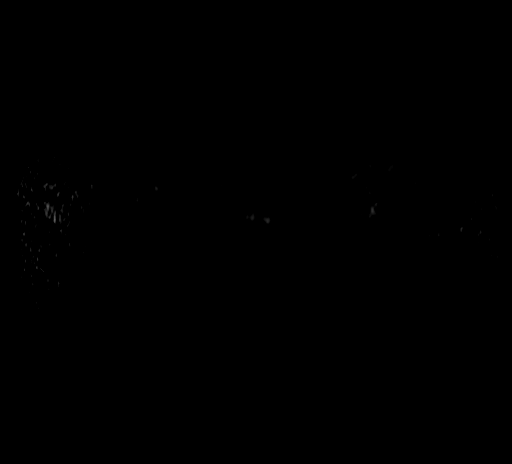
[im 30/88]
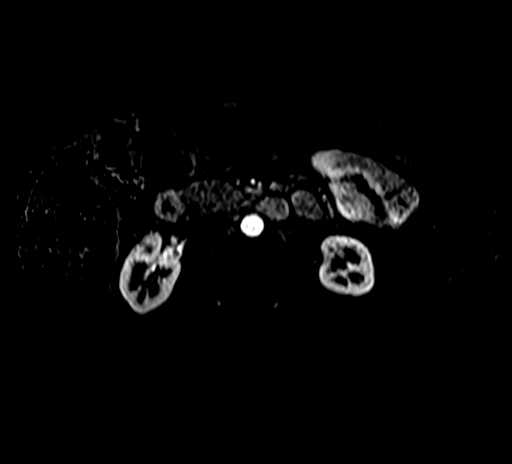
[im 59/88]
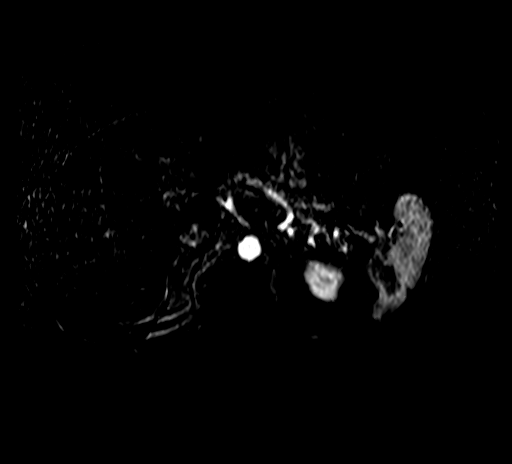
[im 88/88]
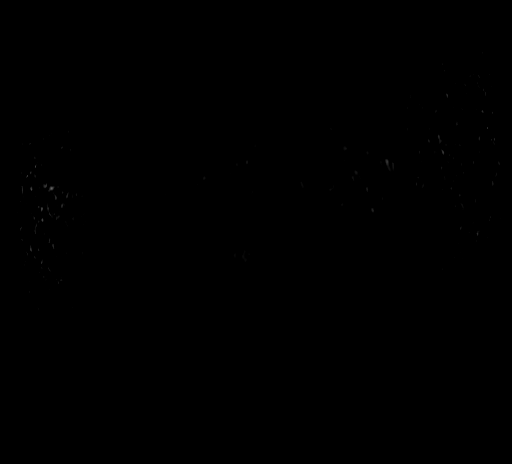

[Series 13: post 45 sec · axial · 2.2mm · 0.78mm/px · z∈[-107,+84]mm · 4 of 88 slices shown]
[im 1/88]
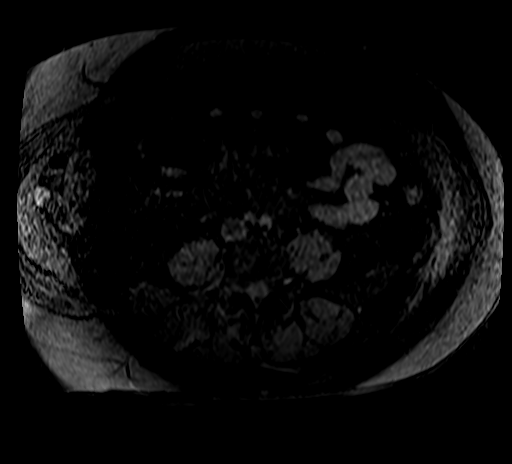
[im 30/88]
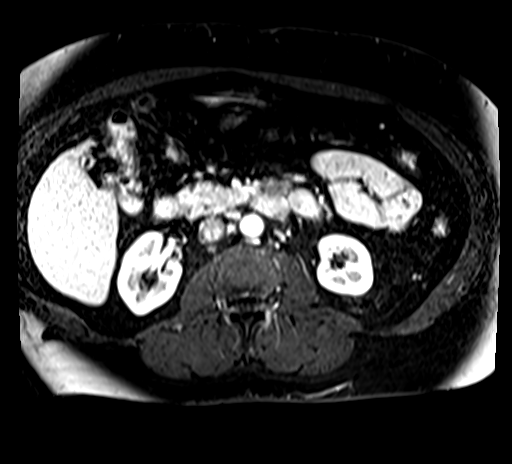
[im 59/88]
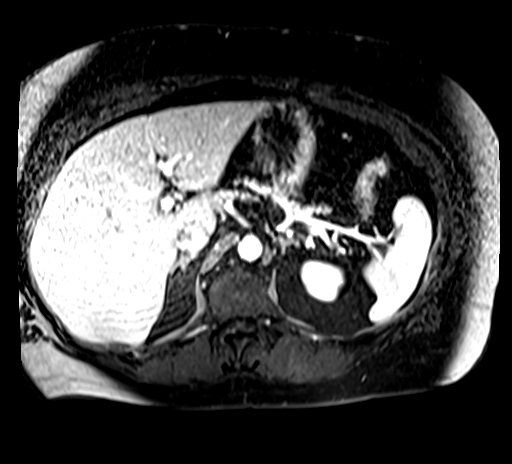
[im 88/88]
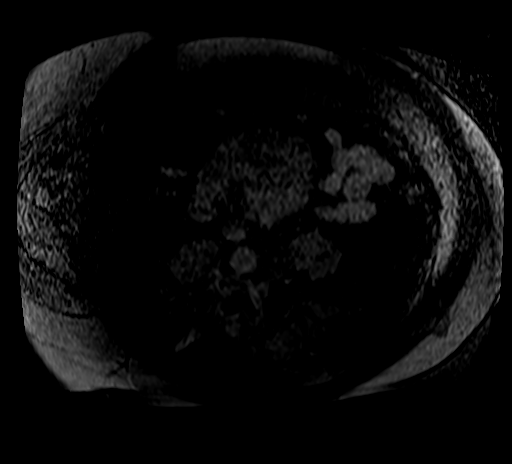

[Series 14: post 45 sec_sub · axial · 2.2mm · 0.78mm/px · 1 of 88 slices shown]
[im 1/88]
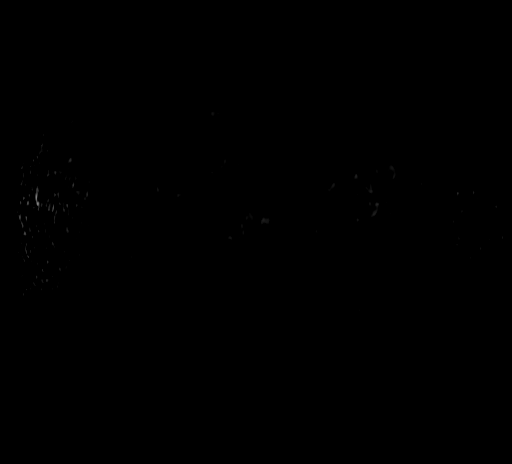

[26 of 48 positions shown; findings below may reference images not displayed]

FINDINGS: Study is limited due to motion.

Lower chest: No acute findings.

Hepatobiliary: Liver is normal in size and contour. No suspicious
hepatic mass identified. The previous hepatic abscesses have
resolved. There is a 12 mm residual mild hyperintense T2 signal
focus with hypoenhancement in the region of the previous lesion near
the dome of the right hepatic lobe. Gallbladder is somewhat
contracted with no cholelithiasis or acute process identified. No
biliary ductal dilatation identified.

Pancreas: No mass, inflammatory changes, or other parenchymal
abnormality identified.

Spleen:  Within normal limits in size and appearance.

Adrenals/Urinary Tract: No masses identified. No evidence of
hydronephrosis.

Stomach/Bowel: Visualized portions within the abdomen are
unremarkable.

Vascular/Lymphatic: No pathologically enlarged lymph nodes
identified. No abdominal aortic aneurysm demonstrated.

Other:  None.

Musculoskeletal: No suspicious bone lesions identified.
IMPRESSION: No acute process identified. Interval resolution of previous hepatic
abscesses. Small residual signal intensity at the dome of right
hepatic lobe likely scarring.

## 2021-10-30 MED ORDER — GADOBENATE DIMEGLUMINE 529 MG/ML IV SOLN
20.0000 mL | Freq: Once | INTRAVENOUS | Status: AC | PRN
  Administered 2021-10-30: 20 mL via INTRAVENOUS

## 2021-10-31 ENCOUNTER — Telehealth: Payer: Self-pay

## 2021-10-31 NOTE — Telephone Encounter (Signed)
-----   Message from Laurice Record, MD sent at 10/31/2021  3:32 PM EST ----- To triage: MRI showed hepatic abscess resolved. Please stop augmentin.

## 2021-10-31 NOTE — Telephone Encounter (Signed)
Called patient to relay results, no answer. Left HIPAA compliant voicemail requesting callback.   Kaedynce Tapp D Alla Sloma, RN  

## 2021-11-04 ENCOUNTER — Other Ambulatory Visit: Payer: Self-pay

## 2021-11-04 DIAGNOSIS — I6523 Occlusion and stenosis of bilateral carotid arteries: Secondary | ICD-10-CM

## 2021-11-04 NOTE — Addendum Note (Signed)
Addended byDoylene Bode on: 11/04/2021 08:24 AM   Modules accepted: Orders

## 2021-11-04 NOTE — Progress Notes (Signed)
Hospital Consult    Reason for Consult:  right carotid artery stenosis Requesting Physician:  Alinda Money MRN #:  [id number]  History of Present Illness: This is a 56 y.o. female who hs hx of sepsis due to liver abscess.  She had a PICC line placed for home IV abx and is followed by ID.  She states that on Monday she was unable to pullback and flush the PICC. She had gone to the ED on Monday.  She states that in the early hours of Tuesday, she noticed swelling in the right arm.  She presented to the ER and was found to have a DVT in the RUE.  She has been started on heparin.    She was seen in our office in the remote past for carotid artery stenosis.  She states she was not happy with the provider and started being seen by Dr. Earlene Plater in [facility].  She tells me that she was seen in February by their PA and the right was in the 60-79% range and appointment for 6 month f/u was made.  While here in the ED, she requested to have carotid duplex since she did not make her f/u appt there and this revealed 80-99% right ICA stenosis.  She has a known left ICA occlusion.  She denies any amaurosis fugax, speech problems or unilateral weakness, numbness or paralysis.  She states that she has had increasing times where she is looking for something and doesn't see it and looks back and it is there.  She states that she has some weakness in the RLE but this started earlier this year after she had a right hip replacement.  This has been unchanged since then.  She denies any claudication symptoms or non healing wounds.  She denies any family hx of AAA.  She has been compliant with her statin and asa.  She continues to smoke.    She states that her mother is dealing with stage IV pancreatic cancer and does not want any interventions until after Christmas.    She stated she was feeling quite anxious discussing her carotid disease and smoking.   The pt is  on a statin for cholesterol management.  The pt  is on a daily aspirin.   Other AC:  currently heparin gtt The pt is on BB/diuretic for hypertension.   The pt is not diabetic.   Tobacco hx:  current  11/05/21: patient returns to discuss options for surgery. I counseled her that a TCAR would likely be appropriate given her contralateral occlusion. She has stopped treatment for hepatic abscess and this has thankfully resolved. 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  Past Medical History:  Diagnosis Date   Anxiety    Arthritis    oa needs hip replacement on right   Asthma    allergies    Cancer ([facility])    squamous cell areas removed from vulva and pre caner areas removed from leg    Carotid artery occlusion    Complication of anesthesia    major depression after general anesthesia   Coronary artery disease    LHC 11/25/12 90% stenosis mid LCx & otherwise nonobstructive dz w/ EF 60-65% S/p PTCA/DES to LCx   Depression    Dyspnea    with exertion    Elevated cholesterol    Exertional dyspnea    chronic   Fibromyalgia  GERD (gastroesophageal reflux disease)    History of cardiovascular stress test 06/2015   low risk   HPV in female    Hyperlipidemia    Hypertension    Liver abscess 09/09/2021   MI (myocardial infarction) ([place]) fe 17, 2014   Obesity    Pneumonia    hx of x 2 years ago    Polycystic ovary disease    Skin tear of upper arm without complication, left, sequela    small skin tear left upper arm no drainage for 1 week   Sleep apnea    mild no cpap    Tobacco abuse     Past Surgical History:  Procedure Laterality Date   CORONARY ANGIOPLASTY WITH STENT PLACEMENT     LEFT HEART CATH AND CORONARY ANGIOGRAPHY N/A 08/09/2018   Procedure: LEFT HEART CATH AND CORONARY ANGIOGRAPHY;  Surgeon: Nelva Bush, MD;  Location: [facility];  Service: Cardiovascular;  Laterality: N/A;   LEFT HEART CATHETERIZATION WITH CORONARY ANGIOGRAM N/A  11/15/2012   Procedure: LEFT HEART CATHETERIZATION WITH CORONARY ANGIOGRAM;  Surgeon: Thayer Headings, MD;  Location: [facility] CATH LAB;  Service: Cardiovascular;  Laterality: N/A;   PERCUTANEOUS CORONARY STENT INTERVENTION (PCI-S)  11/15/2012   Procedure: PERCUTANEOUS CORONARY STENT INTERVENTION (PCI-S);  Surgeon: Thayer Headings, MD;  Location: [facility] CATH LAB;  Service: Cardiovascular;;   skin cancer removed right lower leg Right    TONSILLECTOMY     TOTAL HIP ARTHROPLASTY Right 01/16/2021   Procedure: TOTAL HIP ARTHROPLASTY ANTERIOR APPROACH;  Surgeon: Gaynelle Arabian, MD;  Location: [facility];  Service: Orthopedics;  Laterality: Right;  145min   uterus ablation     VULVECTOMY N/A 11/08/2019   Procedure: excision of VULVA, vulvoscopy;  Surgeon: Dian Queen, MD;  Location: [place];  Service: Gynecology;  Laterality: N/A;    Allergies  Allergen Reactions   Clomiphene Citrate Nausea And Vomiting   Doxycycline Other (See Comments)   Morphine And Related Other (See Comments)    PATIENT REFUSES THIS MEDICATION: states that she does not tolerate this medication well   Other     General anesthesia causes depressive symptoms    Sulfa Antibiotics Other (See Comments)    Not sure ? rash   Clomiphene Rash    Prior to Admission medications   Medication Sig Start Date End Date Taking? Authorizing Provider  albuterol (PROVENTIL HFA;VENTOLIN HFA) 108 (90 Base) MCG/ACT inhaler Inhale 2 puffs into the lungs every 4 (four) hours as needed for wheezing or shortness of breath. 12/21/15  Yes Ward, Delice Bison, DO  ALPRAZolam (XANAX) 1 MG tablet Take 1 mg by mouth 2 (two) times daily as needed for anxiety. 06/12/17  Yes [provider]  aspirin EC 81 MG tablet Take 81 mg by mouth daily. Swallow whole.   Yes [provider]  bisoprolol-hydrochlorothiazide (ZIAC) 5-6.25 MG tablet Take 1 tablet by mouth daily. Pt takes in the pm 12/21/19  Yes [provider]  cefTRIAXone  (ROCEPHIN) IVPB Inject 2 g into the vein daily for 25 days. Indication:  Kleb PNA bacteremia + liver abscess First Dose: Yes Last Day of Therapy:  09/14/21 Labs - Once weekly:  CBC/D and BMP, Labs - Every other week:  ESR and CRP Method of administration: IV Push Method of administration may be changed at the discretion of home infusion pharmacist based upon assessment of the patient and/or caregiver's ability to self-administer the medication ordered. 08/20/21 09/14/21 Yes Pokhrel, Corrie Mckusick, MD  escitalopram (LEXAPRO)  20 MG tablet Take 20 mg by mouth at bedtime. Takes in the pm   Yes [provider]  fluticasone (FLONASE) 50 MCG/ACT nasal spray Place 2 sprays into both nostrils at bedtime.   Yes [provider]  metroNIDAZOLE (FLAGYL) 500 MG tablet Take 1 tablet (500 mg total) by mouth 2 (two) times daily for 25 days. 08/20/21 09/14/21 Yes Pokhrel, Laxman, MD  nitroGLYCERIN (NITROSTAT) 0.4 MG SL tablet Place 0.4 mg under the tongue every 5 (five) minutes as needed for chest pain.   Yes [provider]  oxyCODONE (OXY IR/ROXICODONE) 5 MG immediate release tablet Take 1 tablet (5 mg total) by mouth every 6 (six) hours as needed for severe pain or moderate pain. 08/20/21  Yes Pokhrel, Laxman, MD  pantoprazole (PROTONIX) 40 MG tablet Take 40 mg by mouth daily.   Yes [provider]  rosuvastatin (CRESTOR) 10 MG tablet Take 10 mg by mouth at bedtime.   Yes [provider]  sodium chloride flush (NS) 0.9 % SOLN 10-40 mLs by Intracatheter route every 12 (twelve) hours. 08/20/21  Yes Pokhrel, Laxman, MD  triamcinolone cream (KENALOG) 0.1 % Apply 1 application topically 2 (two) times daily. Patient taking differently: Apply 1 application topically 2 (two) times daily as needed (irritation). 01/09/20  Yes Scot Jun, FNP  nicotine (NICODERM CQ - DOSED IN MG/24 HOURS) 21 mg/24hr patch Place 1 patch (21 mg total) onto the skin daily. Patient not taking:  Reported on 09/03/2021 08/21/21   Flora Lipps, MD  rosuvastatin (CRESTOR) 20 MG tablet Take 1 tablet (20 mg total) by mouth at bedtime. Patient not taking: Reported on 09/03/2021 08/10/18   Isaiah Serge, NP    Social History   Socioeconomic History   Marital status: Legally Separated    Spouse name: Not on file   Number of children: Not on file   Years of education: Not on file   Highest education level: Not on file  Occupational History   Not on file  Tobacco Use   Smoking status: Every Day    Packs/day: 2.00    Years: 40.00    Pack years: 80.00    Types: Cigarettes    Start date: 03/29/1979   Smokeless tobacco: Current   Tobacco comments:    Currently 2ppd  Vaping Use   Vaping Use: Never used  Substance and Sexual Activity   Alcohol use: Not Currently    Comment: rare   Drug use: No   Sexual activity: Not Currently    Partners: Male  Other Topics Concern   Not on file  Social History Narrative   Lives with husband in a 3 story home.  Has no children.     On disability.     Education: high school, some college.   Social Determinants of Health   Financial Resource Strain: Not on file  Food Insecurity: Not on file  Transportation Needs: Not on file  Physical Activity: Not on file  Stress: Not on file  Social Connections: Not on file  Intimate Partner Violence: Not on file     Family History  Problem Relation Age of Onset   Depression Mother    Anxiety disorder Maternal Grandmother    Anxiety disorder Paternal Grandmother    OCD Paternal Grandmother    Depression Paternal Grandmother    Heart attack Father        Deceased, 56   Cerebral aneurysm Father    Heart disease Father  ROS: [x]  Positive   [ ]  Negative   [ ]  All sytems reviewed and are negative  Cardiac: []  chest pain/pressure []  palpitations []  SOB lying flat []  DOE  Vascular: []  pain in legs while walking []  pain in legs at rest []  pain in legs at night []  non-healing  ulcers []  hx of DVT []  swelling in legs  Pulmonary: []  asthma/wheezing []  home O2  Neurologic: []  hx of CVA []  mini stroke   Hematologic: []  hx of cancer  Endocrine:   []  diabetes []  thyroid disease  GI []  GERD  GU: []  CKD/renal failure []  HD--[]  M/W/F or []  T/T/S  Psychiatric: []  anxiety []  depression  Musculoskeletal: []  arthritis []  joint pain  Integumentary: []  rashes []  ulcers  Constitutional: []  fever  []  chills  Physical Examination  There were no vitals filed for this visit.  There is no height or weight on file to calculate BMI.  General:  WDWN in NAD Gait: Not observed HENT: WNL, normocephalic Pulmonary: normal non-labored breathing Cardiac: regular, without Murmur; without carotid bruits Abdomen:  soft, NT/ND; aortic pulse is not palpable Skin: without rashes Vascular Exam/Pulses:  Right Left  Radial 2+ (normal) 2+ (normal)  AT 1+ (weak) 2+ (normal)   Extremities: without ischemic changes, without Gangrene , without cellulitis; without open wounds Musculoskeletal: no muscle wasting or atrophy  Neurologic: A&O X 3; speech is fluent/normal; BUE are 5/5.  Psychiatric:  The pt has Normal affect.  She is anxious   CBC    Component Value Date/Time   WBC 6.5 10/16/2021 1122   RBC 4.88 10/16/2021 1122   HGB 14.1 10/16/2021 1122   HCT 42.2 10/16/2021 1122   PLT 176 10/16/2021 1122   MCV 86.5 10/16/2021 1122   MCH 28.9 10/16/2021 1122   MCHC 33.4 10/16/2021 1122   RDW 13.8 10/16/2021 1122   LYMPHSABS 2,509 10/16/2021 1122   MONOABS 0.5 09/03/2021 1518   EOSABS 234 10/16/2021 1122   BASOSABS 39 10/16/2021 1122    BMET    Component Value Date/Time   NA 137 10/16/2021 1122   K 3.9 10/16/2021 1122   CL 102 10/16/2021 1122   CO2 29 10/16/2021 1122   GLUCOSE 79 10/16/2021 1122   BUN 7 10/16/2021 1122   CREATININE 0.89 10/16/2021 1122   CALCIUM 9.1 10/16/2021 1122   GFRNONAA >60 09/05/2021 0125   GFRAA >60 12/17/2019 2301     COAGS: Lab Results  Component Value Date   INR 1.2 08/17/2021   INR 1.0 01/10/2021     Non-Invasive Vascular Imaging:   Carotid duplex 09/03/2021 Right 80-99% ICA stenosis Left occluded   ASSESSMENT/PLAN: This is a 56 y.o. female with asymptomatic right carotid artery stenosis and left occluded ICA Plan CT angiogram of head / neck ASAP. Will call patient with results and plan revascularization: TCAR vs. CEA.  Shannon [name]. Stanford Breed, MD [facility] Phone Number: [phone] 11/05/2021 2:32 PM

## 2021-11-05 ENCOUNTER — Encounter: Payer: Self-pay | Admitting: Vascular Surgery

## 2021-11-05 ENCOUNTER — Ambulatory Visit (HOSPITAL_COMMUNITY)
Admission: RE | Admit: 2021-11-05 | Discharge: 2021-11-05 | Disposition: A | Discharge status: Home / Self Care | Payer: Medicare HMO | Source: Ambulatory Visit | Attending: Vascular Surgery | Admitting: Vascular Surgery

## 2021-11-05 ENCOUNTER — Other Ambulatory Visit: Payer: Self-pay

## 2021-11-05 ENCOUNTER — Ambulatory Visit: Payer: Medicare HMO | Admitting: Vascular Surgery

## 2021-11-05 VITALS — BP 103/73 | HR 60 | Temp 97.9°F | Resp 20 | Ht 64.0 in | Wt 230.0 lb

## 2021-11-05 DIAGNOSIS — I6521 Occlusion and stenosis of right carotid artery: Secondary | ICD-10-CM

## 2021-11-05 DIAGNOSIS — I6523 Occlusion and stenosis of bilateral carotid arteries: Secondary | ICD-10-CM

## 2021-11-05 DIAGNOSIS — I6522 Occlusion and stenosis of left carotid artery: Secondary | ICD-10-CM

## 2021-11-06 ENCOUNTER — Inpatient Hospital Stay: Admission: RE | Admit: 2021-11-06 | Payer: Medicare HMO | Source: Ambulatory Visit

## 2021-11-21 ENCOUNTER — Ambulatory Visit
Admission: RE | Admit: 2021-11-21 | Discharge: 2021-11-21 | Disposition: A | Discharge status: Home / Self Care | Payer: Medicare HMO | Source: Ambulatory Visit | Attending: Vascular Surgery | Admitting: Vascular Surgery

## 2021-11-21 DIAGNOSIS — I6523 Occlusion and stenosis of bilateral carotid arteries: Secondary | ICD-10-CM

## 2021-11-21 DIAGNOSIS — I672 Cerebral atherosclerosis: Secondary | ICD-10-CM | POA: Diagnosis not present

## 2021-11-21 DIAGNOSIS — M47812 Spondylosis without myelopathy or radiculopathy, cervical region: Secondary | ICD-10-CM | POA: Diagnosis not present

## 2021-11-21 DIAGNOSIS — I700 Atherosclerosis of aorta: Secondary | ICD-10-CM | POA: Diagnosis not present

## 2021-11-21 IMAGING — CT CT ANGIO HEAD-NECK (W OR W/O PERF)
2 of 11 series · 4 of 33 positions shown · non-contrast
Comparison: [DATE]

CLINICAL DATA: Carotid artery stenosis.  Headaches.

EXAM:
CT ANGIOGRAPHY HEAD AND NECK
TECHNIQUE: Multidetector CT imaging of the head and neck was performed using
the standard protocol during bolus administration of intravenous
contrast. Multiplanar CT image reconstructions and MIPs were
obtained to evaluate the vascular anatomy. Carotid stenosis
measurements (when applicable) are obtained utilizing NASCET
criteria, using the distal internal carotid diameter as the
denominator.

[Series 12: brain 3.00 hr40 s3 sag without ibhc · sagittal · non-contrast · 0.36mm/px · 1 of 64 slices shown]
[im 32/64  soft-tissue]
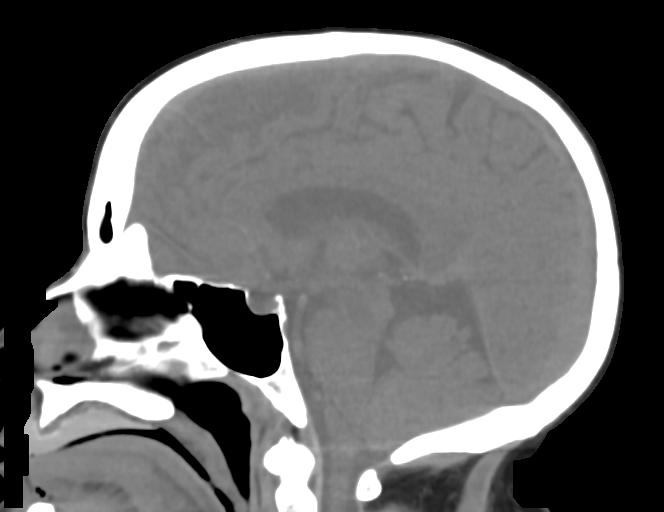

[Series 15: cta head & neck 1.00 hv48 s3 ax thin mips · axial · 0.46mm/px · z∈[-864,-505]mm · 3 of 360 slices shown]
[im 1/360  soft-tissue]
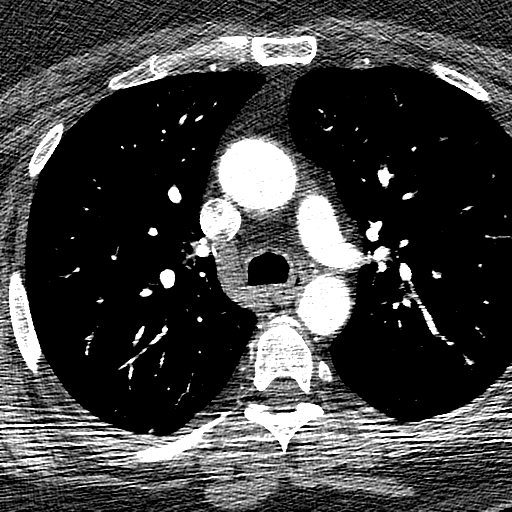
[im 180/360  bone]
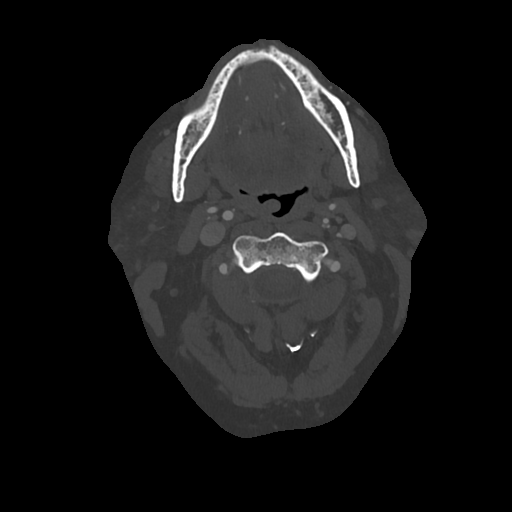
[im 360/360  soft-tissue]
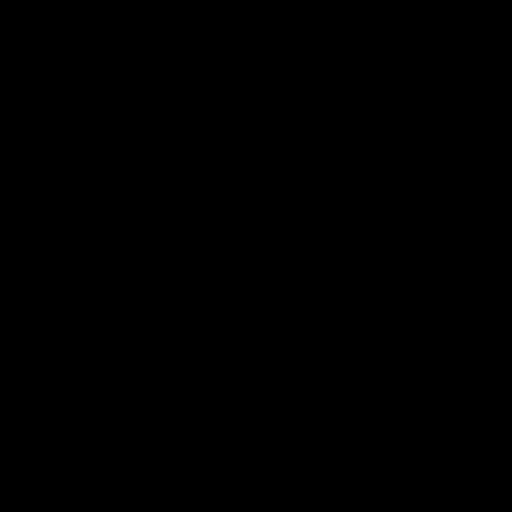

[4 of 33 positions shown; findings below may reference images not displayed]

RADIATION DOSE REDUCTION: This exam was performed according to the
departmental dose-optimization program which includes automated
exposure control, adjustment of the mA and/or kV according to
patient size and/or use of iterative reconstruction technique.

CONTRAST:  75mL [7B] IOPAMIDOL ([7B]) INJECTION 76%
FINDINGS: CT HEAD FINDINGS

Brain: Brain has normal appearance without evidence of atrophy, old
or acute infarction, mass lesion, hemorrhage, hydrocephalus or
extra-axial collection.

Vascular: There is atherosclerotic calcification of the major
vessels at the base of the brain.

Skull: Negative

Sinuses: Clear

Orbits: Normal

Review of the MIP images confirms the above findings

CTA NECK FINDINGS

Aortic arch: Aortic atherosclerosis, mild. Branching pattern is
normal without origin stenosis.

Right carotid system: Innominate artery and right common carotid
artery shows scattered plaque but no stenosis. At the right carotid
bifurcation, there is soft and calcified plaque. Minimal diameter of
the proximal ICA measures 3.2 mm. Compared to a more distal cervical
ICA diameter of 5 mm, this indicates a 35% stenosis.

Left carotid system: Common carotid artery shows scattered plaque.
Stenosis is 50% over the proximal few cm of the vessel. There is
calcified plaque at the carotid bifurcation with soft and calcified
plaque of the ICA bulb. There is occlusion of the ICA in the bulb.
There is reconstitution of the ICA by collateral flow from the
ascending pharyngeal artery. The upper cervical ICA is patent as a
slightly small vessel to and through the skull base.

Vertebral arteries: Vertebral artery origins poorly seen because of
artifact from shoulder density. I cannot accurately state if there
is a vertebral origin stenosis. Beyond the origin regions, both
vertebral arteries are patent through the upper cervical spine
lesion, through the foramen magnum to the basilar.

Skeleton: Ordinary cervical spondylosis.

Other neck: No mass or lymphadenopathy.

Upper chest: Negative

Review of the MIP images confirms the above findings

CTA HEAD FINDINGS

Anterior circulation: Mild atherosclerotic changes present in both
carotid siphon regions without flow limiting stenosis in that
region. The anterior and middle cerebral vessels show flow. No large
vessel occlusion. No correctable proximal stenosis.

Posterior circulation: Both vertebral arteries widely patent to the
basilar. No basilar stenosis. Posterior circulation branch vessels
are normal.

Venous sinuses: Patent and normal.

Anatomic variants: None other significant.

Review of the MIP images confirms the above findings
IMPRESSION: Continued normal appearance of the brain itself.

Atherosclerosis of the aorta, mild.

Atherosclerotic disease at the right carotid bifurcation and ICA
bulb, with maximal stenosis of only 35%.

Advanced atherosclerotic disease of the left ICA bulb with complete
occlusion presently. Reconstitution of the cervical internal carotid
artery via the ascending pharyngeal artery. The vessel is patent as
a small vessel through the upper cervical region, skull base and
siphon region.

No intracranial large vessel occlusion or correctable proximal
stenosis.

Vertebral artery origins are not well seen because of shoulder
density.

## 2021-11-21 MED ORDER — IOPAMIDOL (ISOVUE-370) INJECTION 76%
75.0000 mL | Freq: Once | INTRAVENOUS | Status: AC | PRN
  Administered 2021-11-21: 75 mL via INTRAVENOUS

## 2021-11-22 ENCOUNTER — Ambulatory Visit (INDEPENDENT_AMBULATORY_CARE_PROVIDER_SITE_OTHER): Payer: Medicare HMO | Admitting: Vascular Surgery

## 2021-11-22 ENCOUNTER — Other Ambulatory Visit: Payer: Self-pay

## 2021-11-22 DIAGNOSIS — I6521 Occlusion and stenosis of right carotid artery: Secondary | ICD-10-CM

## 2021-11-22 NOTE — Progress Notes (Signed)
CT angiogram reviewed in detail.  Right carotid with very mild stenosis. No role for revascularization in this patient. Continue best medical therapy for atherosclerosis. Follow up with me in 1 year with repeat duplex. No answer x 2 when called to discuss her CT angiogram results.  Yevonne Aline. Stanford Breed, MD [facility] Phone Number: [phone] 11/22/2021 1:50 PM

## 2021-11-27 DIAGNOSIS — I6521 Occlusion and stenosis of right carotid artery: Secondary | ICD-10-CM | POA: Diagnosis not present

## 2021-12-26 DIAGNOSIS — E782 Mixed hyperlipidemia: Secondary | ICD-10-CM | POA: Diagnosis not present

## 2021-12-26 DIAGNOSIS — E538 Deficiency of other specified B group vitamins: Secondary | ICD-10-CM | POA: Diagnosis not present

## 2021-12-26 DIAGNOSIS — R1310 Dysphagia, unspecified: Secondary | ICD-10-CM | POA: Diagnosis not present

## 2021-12-26 DIAGNOSIS — E559 Vitamin D deficiency, unspecified: Secondary | ICD-10-CM | POA: Diagnosis not present

## 2021-12-26 DIAGNOSIS — I10 Essential (primary) hypertension: Secondary | ICD-10-CM | POA: Diagnosis not present

## 2021-12-26 DIAGNOSIS — J449 Chronic obstructive pulmonary disease, unspecified: Secondary | ICD-10-CM | POA: Diagnosis not present

## 2021-12-26 DIAGNOSIS — F411 Generalized anxiety disorder: Secondary | ICD-10-CM | POA: Diagnosis not present

## 2022-01-10 DIAGNOSIS — E538 Deficiency of other specified B group vitamins: Secondary | ICD-10-CM | POA: Diagnosis not present

## 2022-01-15 DIAGNOSIS — R935 Abnormal findings on diagnostic imaging of other abdominal regions, including retroperitoneum: Secondary | ICD-10-CM | POA: Diagnosis not present

## 2022-01-15 DIAGNOSIS — Z1211 Encounter for screening for malignant neoplasm of colon: Secondary | ICD-10-CM | POA: Diagnosis not present

## 2022-01-15 DIAGNOSIS — R109 Unspecified abdominal pain: Secondary | ICD-10-CM | POA: Diagnosis not present

## 2022-02-10 NOTE — Progress Notes (Unsigned)
Cardiology Office Note:    Date:  02/10/2022   ID:  Shannon, [name] 11-Sep-1966, MRN [id number]  PCP:  Shannon Rota, NP   [facility] Providers Cardiologist:  None {   Referring MD: Shannon Rota, NP    History of Present Illness:    Shannon [name] is a 56 y.o. female with a hx of CAD (s/p DES to LCx in 2014, cath in 07/2018 showing patent stent with mild to moderate nonobstructive disease involving the mid-LAD and RCA, low-risk NST in 11/2020), carotid artery stenosis (known LICA occlusion), HTN, HLD and tobacco use who presents to clinic for follow-up.  Per review of the record, she had a stress test in 11/2020 done for pre-op evaluation showing small, mild intensity, mid to apical anteroseptal defect that is partially reversible suggestive of either variable soft tissue attenuation or a mild ischemic territory, low risk, EF 69%.   She then was seen in the ED on 08/14/21 with back pain radiating to her chest. Trops negative. CT PE showed no PE but demonstrated 78m lingular nodule. She then represented a couple of days later with enterobacteria/kleb bacteremia. Cardiology was consulted on that admission for chest pain and mildly elevated troponin that was deemed atypical and unlikely cardiac in nature. TTE 08/18/21 with EF 60-65%, normal RV, no significant valve disease.  Was last seen in clinic on 08/2021 when she was recently started on apixaban for a RUE DVT. She was also scheduled for follow-up with vascular given 80-99% RICA. Total occlusion of LICA.  Follow-up CTA angio showed only mild narrowing on the right and she was recommended for continued surveillance.   Today, ***  Past Medical History:  Diagnosis Date   Anxiety    Arthritis    oa needs hip replacement on right   Asthma    allergies    Cancer ([facility]    squamous cell areas removed from vulva and pre caner areas removed from leg    Carotid artery occlusion    Complication of anesthesia    major depression  after general anesthesia   Coronary artery disease    LHC 11/25/12 90% stenosis mid LCx & otherwise nonobstructive dz w/ EF 60-65% S/p PTCA/DES to LCx   Depression    Dyspnea    with exertion    Elevated cholesterol    Exertional dyspnea    chronic   Fibromyalgia    GERD (gastroesophageal reflux disease)    History of cardiovascular stress test 06/2015   low risk   HPV in female    Hyperlipidemia    Hypertension    Liver abscess 09/09/2021   MI (myocardial infarction) ([facility] fe 17, 2014   Obesity    Pneumonia    hx of x 2 years ago    Polycystic ovary disease    Skin tear of upper arm without complication, left, sequela    small skin tear left upper arm no drainage for 1 week   Sleep apnea    mild no cpap    Tobacco abuse     Past Surgical History:  Procedure Laterality Date   CORONARY ANGIOPLASTY WITH STENT PLACEMENT     LEFT HEART CATH AND CORONARY ANGIOGRAPHY N/A 08/09/2018   Procedure: LEFT HEART CATH AND CORONARY ANGIOGRAPHY;  Surgeon: ENelva Bush MD;  [facility];  Service: Cardiovascular;  Laterality: N/A;   LEFT HEART CATHETERIZATION WITH CORONARY ANGIOGRAM N/A 11/15/2012   Procedure: LEFT HEART CATHETERIZATION WITH CORONARY ANGIOGRAM;  Surgeon: PArnette Norris  Deboraha Sprang, MD;  Location: [place] [facility];  Service: Cardiovascular;  Laterality: N/A;   PERCUTANEOUS CORONARY STENT INTERVENTION (PCI-S)  11/15/2012   Procedure: PERCUTANEOUS CORONARY STENT INTERVENTION (PCI-S);  Surgeon: Thayer Headings, MD;  Location: [facility];  Service: Cardiovascular;;   skin cancer removed right lower leg Right    TONSILLECTOMY     TOTAL HIP ARTHROPLASTY Right 01/16/2021   Procedure: TOTAL HIP ARTHROPLASTY ANTERIOR APPROACH;  Surgeon: Gaynelle Arabian, MD;  Location: [facility];  Service: Orthopedics;  Laterality: Right;  130mn   uterus ablation     VULVECTOMY N/A 11/08/2019   Procedure: excision of VULVA, vulvoscopy;  Surgeon: GDian Queen MD;  [place]  Service: Gynecology;  Laterality: N/A;    Current Medications: No outpatient medications have been marked as taking for the 02/20/22 encounter (Appointment) with PFreada Bergeron MD.     Allergies:   Clomiphene citrate, Doxycycline, Morphine and related, Other, Sulfa antibiotics, and Clomiphene   Social History   Socioeconomic History   Marital status: Legally Separated    Spouse name: Not on file   Number of children: Not on file   Years of education: Not on file   Highest education level: Not on file  Occupational History   Not on file  Tobacco Use   Smoking status: Every Day    Packs/day: 1.50    Years: 40.00    Pack years: 60.00    Types: Cigarettes    Start date: 03/29/1979   Smokeless tobacco: Current   Tobacco comments:    Currently 2ppd  Vaping Use   Vaping Use: Never used  Substance and Sexual Activity   Alcohol use: Not Currently    Comment: rare   Drug use: No   Sexual activity: Not Currently    Partners: Male  Other Topics Concern   Not on file  Social History Narrative   Lives with husband in a 3 story home.  Has no children.     On disability.     Education: high school, some college.   Social Determinants of Health   Financial Resource Strain: Not on file  Food Insecurity: Not on file  Transportation Needs: Not on file  Physical Activity: Not on file  Stress: Not on file  Social Connections: Not on file     Family History: The patient's family history includes Anxiety disorder in her maternal grandmother and paternal grandmother; Cerebral aneurysm in her father; Depression in her mother and paternal grandmother; Heart attack in her father; Heart disease in her father; OCD in her paternal grandmother.  ROS:   Please see the history of present illness.    Review of Systems  Constitutional:  Positive for malaise/fatigue.  Respiratory:  Negative for shortness of breath.   Cardiovascular:  Negative for chest pain, palpitations,  orthopnea, claudication, leg swelling and PND.  Gastrointestinal:  Negative for blood in stool and melena.  Genitourinary:  Negative for hematuria.  Neurological:  Negative for dizziness and loss of consciousness.  Psychiatric/Behavioral:  Positive for depression.     EKGs/Labs/Other Studies Reviewed:    The following studies were reviewed today: CTA Neck: IMPRESSION: Continued normal appearance of the brain itself.   Atherosclerosis of the aorta, mild.   Atherosclerotic disease at the right carotid bifurcation and ICA bulb, with maximal stenosis of only 35%.   Advanced atherosclerotic disease of the left ICA bulb with complete occlusion presently. Reconstitution of the cervical internal carotid artery via the ascending  pharyngeal artery. The vessel is patent as a small vessel through the upper cervical region, skull base and siphon region.   No intracranial large vessel occlusion or correctable proximal stenosis.   Vertebral artery origins are not well seen because of shoulder density.     TTE 07/2021: IMPRESSIONS   1. Left ventricular ejection fraction, by estimation, is 60 to 65%. The  left ventricle has normal function. The left ventricle has no regional  wall motion abnormalities. Left ventricular diastolic parameters were  normal.   2. Right ventricular systolic function is normal. The right ventricular  size is normal.   3. The mitral valve is normal in structure. No evidence of mitral valve  regurgitation. No evidence of mitral stenosis.   4. The aortic valve was not well visualized. Aortic valve regurgitation  is not visualized. No aortic stenosis is present.   5. The inferior vena cava is normal in size with greater than 50%  respiratory variability, suggesting right atrial pressure of 3 mmHg.  Cath 07/2018 Conclusions: Mild to moderate, non-obstructive coronary artery disease involving the mid LAD and RCA. Widely patent proximal/mid LCx stent. Normal left  ventricular contraction with mildly elevated filling pressure.   Recommendations: Medical therapy and aggressive secondary prevention, including smoking cessation, weight loss, and lipid control.  Consider increasing statin therapy if LDL not at goal (<70). Start furosemide 20 mg daily for component of diastolic heart failure.   Recommend Aspirin '81mg'$  daily for moderate CAD.    Nelva Bush, MD [facility] Pager: [phone]   2D echo 02/2018  - Procedure narrative: Transthoracic echocardiography. Image    quality was suboptimal. Contrast enhancement was employed.  - Left ventricle: The cavity size was normal. Wall thickness was    increased in a pattern of mild LVH. Systolic function was normal.    The estimated ejection fraction was in the range of 55% to 60%.    Wall motion was normal; there were no regional wall motion    abnormalities. Left ventricular diastolic function parameters    were normal.   EKG:  No new tracing  Recent Labs: 08/19/2021: Magnesium 2.1 10/16/2021: ALT 25; BUN 7; Creat 0.89; Hemoglobin 14.1; Platelets 176; Potassium 3.9; Sodium 137  Recent Lipid Panel    Component Value Date/Time   CHOL 194 08/10/2018 0539   TRIG 264 (H) 08/10/2018 0539   HDL 36 (L) 08/10/2018 0539   CHOLHDL 5.4 08/10/2018 0539   VLDL 53 (H) 08/10/2018 0539   LDLCALC 105 (H) 08/10/2018 0539   LDLDIRECT 200.3 09/04/2009 1308          Physical Exam:    VS:  There were no vitals taken for this visit.    Wt Readings from Last 3 Encounters:  11/05/21 230 lb (104.3 kg)  10/16/21 232 lb (105.2 kg)  09/11/21 228 lb 6.4 oz (103.6 kg)     GEN:  Well nourished, well developed in no acute distress HEENT: Normal NECK: No JVD; Right carotid bruit CARDIAC: RRR, no murmurs, rubs, gallops RESPIRATORY:  Clear to auscultation without rales, wheezing or rhonchi  ABDOMEN: Soft, non-tender, non-distended MUSCULOSKELETAL:  Trace RUE swelling. No LE edema. Warm.  SKIN: Warm and  dry NEUROLOGIC:  Alert and oriented x 3 PSYCHIATRIC:  Normal affect   ASSESSMENT:    No diagnosis found.  PLAN:    In order of problems listed above:  #Known CAD s/p PCI to Lcx: Stable on cath in 2019 with patent Lcx stent and mild RCA and  LAD disease. No anginal symptoms. -Continue ASA '81mg'$  daily -Continue crestor '20mg'$  daily -Will refer to lipid clinic for PCKS9i due to symptoms with statins -Continue bisoprolol-HCTZ 5-6.'25mg'$  daily -Discussed tobacco cessation today  #RUE DVT: [facility]ed to [facility] hospital in 08/2021 with RUE swelling found to have RUE DVT surrounding PICC line. Now s/p 3 months of apixaban.  #Carotid Artery disease: CTA head an neck with CTO of LICA and only mild disease on right.  -Continue ASA '81mg'$  daily -Continue crestor '20mg'$  daily -Will refer to lipid clinic for PCKS9i due to symptoms with statins -Follow-up with vascular as scheduled  #Atypical Chest Pain: Reassuring cardiac work-up with low risk myoview (small, mild intensity, mid to apical anteroseptal defect that is partially reversible suggestive of either variable soft tissue attenuation or a mild ischemic territory) and TTE with LVEF 60-65%, no significant valve disease. Deemed likely to be related to chronic back pain. -Continue ASA '81mg'$  daily -Continue crestor '20mg'$  daily  #HTN: Controlled at home. -Continue bisoprolol-HCTZ 5-6.'25mg'$  daily  #HLD: LDL 103 in 11/2019. Has muscle aches with statins and is very interested in PCSK9i. -Crestor '20mg'$  daily -Continue zetia '10mg'$  daily -Refer to lipid clinic -Goal LDL<55  #Pulmonary Nodule: Recommended for repeat CT scan.  #Klebsiella Bacteremia: -On augmentin per ID  #Tobacco Abuse: Discussed importance of quitting at length today. Patient is going to try and really wants to cut back.        Medication Adjustments/Labs and Tests Ordered: Current medicines are reviewed at length with the patient today.  Concerns regarding medicines are outlined  above.  No orders of the defined types were placed in this encounter.  No orders of the defined types were placed in this encounter.   There are no Patient Instructions on file for this visit.   Signed, Freada Bergeron, MD  02/10/2022 2:59 PM    [place]

## 2022-02-20 ENCOUNTER — Ambulatory Visit: Payer: Medicare HMO | Admitting: Cardiology

## 2022-02-28 DIAGNOSIS — E538 Deficiency of other specified B group vitamins: Secondary | ICD-10-CM | POA: Diagnosis not present

## 2022-05-02 DIAGNOSIS — D511 Vitamin B12 deficiency anemia due to selective vitamin B12 malabsorption with proteinuria: Secondary | ICD-10-CM | POA: Diagnosis not present

## 2022-05-02 DIAGNOSIS — E782 Mixed hyperlipidemia: Secondary | ICD-10-CM | POA: Diagnosis not present

## 2022-05-02 DIAGNOSIS — F332 Major depressive disorder, recurrent severe without psychotic features: Secondary | ICD-10-CM | POA: Diagnosis not present

## 2022-05-02 DIAGNOSIS — R7303 Prediabetes: Secondary | ICD-10-CM | POA: Diagnosis not present

## 2022-05-02 DIAGNOSIS — E538 Deficiency of other specified B group vitamins: Secondary | ICD-10-CM | POA: Diagnosis not present

## 2022-05-02 DIAGNOSIS — F411 Generalized anxiety disorder: Secondary | ICD-10-CM | POA: Diagnosis not present

## 2022-05-02 DIAGNOSIS — I10 Essential (primary) hypertension: Secondary | ICD-10-CM | POA: Diagnosis not present

## 2022-05-02 DIAGNOSIS — K2980 Duodenitis without bleeding: Secondary | ICD-10-CM | POA: Diagnosis not present

## 2022-05-02 DIAGNOSIS — I25119 Atherosclerotic heart disease of native coronary artery with unspecified angina pectoris: Secondary | ICD-10-CM | POA: Diagnosis not present

## 2022-05-07 DIAGNOSIS — K649 Unspecified hemorrhoids: Secondary | ICD-10-CM | POA: Diagnosis not present

## 2022-05-07 DIAGNOSIS — D123 Benign neoplasm of transverse colon: Secondary | ICD-10-CM | POA: Diagnosis not present

## 2022-05-07 DIAGNOSIS — K449 Diaphragmatic hernia without obstruction or gangrene: Secondary | ICD-10-CM | POA: Diagnosis not present

## 2022-05-07 DIAGNOSIS — D120 Benign neoplasm of cecum: Secondary | ICD-10-CM | POA: Diagnosis not present

## 2022-05-07 DIAGNOSIS — Z1211 Encounter for screening for malignant neoplasm of colon: Secondary | ICD-10-CM | POA: Diagnosis not present

## 2022-05-07 DIAGNOSIS — K319 Disease of stomach and duodenum, unspecified: Secondary | ICD-10-CM | POA: Diagnosis not present

## 2022-05-07 DIAGNOSIS — R1013 Epigastric pain: Secondary | ICD-10-CM | POA: Diagnosis not present

## 2022-05-09 DIAGNOSIS — D123 Benign neoplasm of transverse colon: Secondary | ICD-10-CM | POA: Diagnosis not present

## 2022-05-09 DIAGNOSIS — K319 Disease of stomach and duodenum, unspecified: Secondary | ICD-10-CM | POA: Diagnosis not present

## 2022-05-09 DIAGNOSIS — D120 Benign neoplasm of cecum: Secondary | ICD-10-CM | POA: Diagnosis not present

## 2022-05-14 ENCOUNTER — Other Ambulatory Visit: Payer: Self-pay | Admitting: *Deleted

## 2022-05-14 NOTE — Patient Outreach (Signed)
  Care Coordination   05/14/2022 Name: Shannon [name] MRN: [id number] DOB: 04-Nov-1965   Care Coordination Outreach Attempts:  An unsuccessful telephone outreach was attempted today to offer the patient information about available care coordination services as a benefit of their health plan.   Follow Up Plan:  Additional outreach attempts will be made to offer the patient care coordination information and services.   Encounter Outcome:  No Answer  Care Coordination Interventions Activated:  No   Care Coordination Interventions:  No, not indicated    Raina Mina, RN Care Management Coordinator [place] Office [phone]

## 2022-05-22 ENCOUNTER — Other Ambulatory Visit: Payer: Self-pay | Admitting: *Deleted

## 2022-05-22 NOTE — Patient Outreach (Signed)
  Care Coordination   05/22/2022 Name: Shannon [name] MRN: [id number] DOB: 1966-06-18   Care Coordination Outreach Attempts:  A second unsuccessful outreach was attempted today to offer the patient with information about available care coordination services as a benefit of their health plan.     Follow Up Plan:  Additional outreach attempts will be made to offer the patient care coordination information and services.   Encounter Outcome:  No Answer  Care Coordination Interventions Activated:  No   Care Coordination Interventions:  No, not indicated    Raina Mina, RN Care Management Coordinator [place] Office [phone]

## 2022-05-28 DIAGNOSIS — R918 Other nonspecific abnormal finding of lung field: Secondary | ICD-10-CM | POA: Diagnosis not present

## 2022-06-03 ENCOUNTER — Telehealth: Payer: Self-pay

## 2022-06-03 NOTE — Patient Outreach (Signed)
  Care Coordination   06/03/2022 Name: Shannon [name] MRN: [id number] DOB: [birth year]   Care Coordination Outreach Attempts:  A third unsuccessful outreach was attempted today to offer the patient with information about available care coordination services as a benefit of their health plan.   Follow Up Plan:  No further outreach attempts will be made at this time. We have been unable to contact the patient to offer or enroll patient in care coordination services  Encounter Outcome:  No Answer  Care Coordination Interventions Activated:  No   Care Coordination Interventions:  No, not indicated    Daneen Schick, BSW, CDP Social Worker, Certified Dementia Practitioner Care Coordination [phone]

## 2022-06-20 DIAGNOSIS — L820 Inflamed seborrheic keratosis: Secondary | ICD-10-CM | POA: Diagnosis not present

## 2022-06-20 DIAGNOSIS — L821 Other seborrheic keratosis: Secondary | ICD-10-CM | POA: Diagnosis not present

## 2022-06-20 DIAGNOSIS — D1801 Hemangioma of skin and subcutaneous tissue: Secondary | ICD-10-CM | POA: Diagnosis not present

## 2022-06-20 DIAGNOSIS — L814 Other melanin hyperpigmentation: Secondary | ICD-10-CM | POA: Diagnosis not present

## 2022-06-20 DIAGNOSIS — C44729 Squamous cell carcinoma of skin of left lower limb, including hip: Secondary | ICD-10-CM | POA: Diagnosis not present

## 2022-06-20 DIAGNOSIS — Z08 Encounter for follow-up examination after completed treatment for malignant neoplasm: Secondary | ICD-10-CM | POA: Diagnosis not present

## 2022-06-20 DIAGNOSIS — Z789 Other specified health status: Secondary | ICD-10-CM | POA: Diagnosis not present

## 2022-06-20 DIAGNOSIS — L538 Other specified erythematous conditions: Secondary | ICD-10-CM | POA: Diagnosis not present

## 2022-06-20 DIAGNOSIS — Z85828 Personal history of other malignant neoplasm of skin: Secondary | ICD-10-CM | POA: Diagnosis not present

## 2022-06-20 DIAGNOSIS — L298 Other pruritus: Secondary | ICD-10-CM | POA: Diagnosis not present

## 2022-06-24 DIAGNOSIS — R29890 Loss of height: Secondary | ICD-10-CM | POA: Diagnosis not present

## 2022-06-24 DIAGNOSIS — Z6841 Body Mass Index (BMI) 40.0 and over, adult: Secondary | ICD-10-CM | POA: Diagnosis not present

## 2022-06-24 DIAGNOSIS — F418 Other specified anxiety disorders: Secondary | ICD-10-CM | POA: Diagnosis not present

## 2022-06-24 DIAGNOSIS — Z124 Encounter for screening for malignant neoplasm of cervix: Secondary | ICD-10-CM | POA: Diagnosis not present

## 2022-06-24 DIAGNOSIS — Z8262 Family history of osteoporosis: Secondary | ICD-10-CM | POA: Diagnosis not present

## 2022-06-24 DIAGNOSIS — N958 Other specified menopausal and perimenopausal disorders: Secondary | ICD-10-CM | POA: Diagnosis not present

## 2022-06-24 DIAGNOSIS — F17210 Nicotine dependence, cigarettes, uncomplicated: Secondary | ICD-10-CM | POA: Diagnosis not present

## 2022-06-24 DIAGNOSIS — Z1231 Encounter for screening mammogram for malignant neoplasm of breast: Secondary | ICD-10-CM | POA: Diagnosis not present

## 2022-07-03 DIAGNOSIS — M25552 Pain in left hip: Secondary | ICD-10-CM | POA: Diagnosis not present

## 2022-07-03 DIAGNOSIS — M1612 Unilateral primary osteoarthritis, left hip: Secondary | ICD-10-CM | POA: Diagnosis not present

## 2022-07-03 DIAGNOSIS — Z96641 Presence of right artificial hip joint: Secondary | ICD-10-CM | POA: Diagnosis not present

## 2022-07-15 DIAGNOSIS — M5451 Vertebrogenic low back pain: Secondary | ICD-10-CM | POA: Diagnosis not present

## 2022-07-15 DIAGNOSIS — M47816 Spondylosis without myelopathy or radiculopathy, lumbar region: Secondary | ICD-10-CM | POA: Diagnosis not present

## 2022-07-15 DIAGNOSIS — M546 Pain in thoracic spine: Secondary | ICD-10-CM | POA: Diagnosis not present

## 2022-07-15 DIAGNOSIS — M791 Myalgia, unspecified site: Secondary | ICD-10-CM | POA: Diagnosis not present

## 2022-07-16 ENCOUNTER — Ambulatory Visit: Payer: Medicare HMO | Admitting: Student

## 2022-07-17 NOTE — Therapy (Deleted)
OUTPATIENT PHYSICAL THERAPY THORACOLUMBAR EVALUATION   Patient Name: Shannon [name] MRN: [id number] DOB:19-Feb-1966, 56 y.o., female Today's Date: 07/17/2022    Past Medical History:  Diagnosis Date   Anxiety    Arthritis    oa needs hip replacement on right   Asthma    allergies    Cancer ([place])    squamous cell areas removed from vulva and pre caner areas removed from leg    Carotid artery occlusion    Complication of anesthesia    major depression after general anesthesia   Coronary artery disease    LHC 11/25/12 90% stenosis mid LCx & otherwise nonobstructive dz w/ EF 60-65% S/p PTCA/DES to LCx   Depression    Dyspnea    with exertion    Elevated cholesterol    Exertional dyspnea    chronic   Fibromyalgia    GERD (gastroesophageal reflux disease)    History of cardiovascular stress test 06/2015   low risk   HPV in female    Hyperlipidemia    Hypertension    Liver abscess 09/09/2021   MI (myocardial infarction) ([place]) fe 17, 2014   Obesity    Pneumonia    hx of x 2 years ago    Polycystic ovary disease    Skin tear of upper arm without complication, left, sequela    small skin tear left upper arm no drainage for 1 week   Sleep apnea    mild no cpap    Tobacco abuse    Past Surgical History:  Procedure Laterality Date   CORONARY ANGIOPLASTY WITH STENT PLACEMENT     LEFT HEART CATH AND CORONARY ANGIOGRAPHY N/A 08/09/2018   Procedure: LEFT HEART CATH AND CORONARY ANGIOGRAPHY;  Surgeon: Nelva Bush, MD;  Location: [facility];  Service: Cardiovascular;  Laterality: N/A;   LEFT HEART CATHETERIZATION WITH CORONARY ANGIOGRAM N/A 11/15/2012   Procedure: LEFT HEART CATHETERIZATION WITH CORONARY ANGIOGRAM;  Surgeon: Thayer Headings, MD;  Location: [facility] CATH LAB;  Service: Cardiovascular;  Laterality: N/A;   PERCUTANEOUS CORONARY STENT INTERVENTION (PCI-S)  11/15/2012   Procedure: PERCUTANEOUS CORONARY STENT INTERVENTION (PCI-S);  Surgeon: Thayer Headings, MD;   Location: [facility] CATH LAB;  Service: Cardiovascular;;   skin cancer removed right lower leg Right    TONSILLECTOMY     TOTAL HIP ARTHROPLASTY Right 01/16/2021   Procedure: TOTAL HIP ARTHROPLASTY ANTERIOR APPROACH;  Surgeon: Gaynelle Arabian, MD;  Location: [facility];  Service: Orthopedics;  Laterality: Right;  15mn   uterus ablation     VULVECTOMY N/A 11/08/2019   Procedure: excision of VULVA, vulvoscopy;  Surgeon: GDian Queen MD;  Location: [facility]  Service: Gynecology;  Laterality: N/A;   Patient Active Problem List   Diagnosis Date Noted   Liver abscess 09/09/2021   DVT (deep venous thrombosis) ([facility] 09/03/2021   Bacteremia due to Klebsiella pneumoniae 08/18/2021   Chest pain 08/18/2021   Atypical chest pain 08/17/2021   Hypotension 08/17/2021   Thrombocytopenia (H[facility]) 08/17/2021   Hyperglycemia 08/17/2021   Hyponatremia 08/17/2021   Hypokalemia 08/17/2021   Elevated troponin 08/17/2021   Obesity (BMI 30-39.9) 08/17/2021   Asthma 08/17/2021   Anxiety 08/17/2021   Primary osteoarthritis of right hip 01/16/2021   Chronic diastolic HF (heart failure) ([facility] 08/10/2018   GERD (gastroesophageal reflux disease) 08/09/2018   Unstable angina ([facility] 08/09/2018   Carotid artery disease ([facility] 07/24/2017   AAA (abdominal aortic aneurysm) without rupture ([facility] 01/05/2013   Other malaise and fatigue  01/05/2013   Palpitations 01/05/2013   Tobacco use disorder 12/02/2012   Coronary artery disease    Mixed hyperlipidemia    Essential hypertension    SHORTNESS OF BREATH 09/04/2009   CHEST PAIN 09/04/2009    PCP: Carolee Rota   REFERRING PROVIDER: Ramiro Harvest, PA-C  REFERRING DIAG:  PT eval/tx for chronic back pain and lower extremity weakness  Rationale for Evaluation and Treatment Rehabilitation  THERAPY DIAG:  Chronic low back pain Muscle weakness  ONSET DATE: chronic   SUBJECTIVE:                                                                                                                                                                                            SUBJECTIVE STATEMENT: *** PERTINENT HISTORY:  Rt THR anterior approach 4/22, heart stent   PAIN:  Are you having pain? {OPRCPAIN:27236}   PRECAUTIONS: None  WEIGHT BEARING RESTRICTIONS: No  FALLS:  Has patient fallen in last 6 months? No  LIVING ENVIRONMENT: Lives with: lives with their family Lives in: House/apartment Stairs: Yes: {Stairs:24000} Has following equipment at home: {Assistive devices:23999}  OCCUPATION: not working   PLOF: Independent and Independent with basic ADLs  PATIENT GOALS: less pain and to get stronger so she is more functional   OBJECTIVE:   PATIENT SURVEYS:  {rehab surveys:24030}  COGNITION:  Overall cognitive status: Within functional limits for tasks assessed     SENSATION: Not tested  MUSCLE LENGTH: Hamstrings: Right *** deg; Left *** deg Marcello Moores test: Right *** deg; Left *** deg  POSTURE: {posture:25561}  PALPATION: ***  LUMBAR ROM:   AROM eval  Flexion   Extension   Right lateral flexion   Left lateral flexion   Right rotation   Left rotation    (Blank rows = not tested)   LOWER EXTREMITY MMT:    MMT Right eval Left eval  Hip flexion    Hip extension    Hip abduction    Hip adduction    Hip internal rotation    Hip external rotation    Knee flexion    Knee extension    Ankle dorsiflexion    Ankle plantarflexion    Ankle inversion    Ankle eversion     (Blank rows = not tested)  FUNCTIONAL TESTS:  30 seconds chair stand test 2 minute walk test: *** Single leg stance:  RT:       TODAY'S TREATMENT:  07/21/22 Evaluation     PATIENT EDUCATION:  Education details: HEP Person educated: Patient Education method: Theatre stage manager Education comprehension: verbalized understanding and returned demonstration   HOME EXERCISE  PROGRAM: ***  ASSESSMENT:  CLINICAL  IMPRESSION: Patient is a 56 y.o. female who was seen today for physical therapy evaluation and treatment for low back pain and LE weakness.    OBJECTIVE IMPAIRMENTS: {opptimpairments:25111}.   ACTIVITY LIMITATIONS: {activitylimitations:27494}  PARTICIPATION LIMITATIONS: {participationrestrictions:25113}  PERSONAL FACTORS: Time since onset of injury/illness/exacerbation are also affecting patient's functional outcome.   REHAB POTENTIAL: Good  CLINICAL DECISION MAKING: Stable/uncomplicated  EVALUATION COMPLEXITY: {Evaluation complexity:25115}   GOALS: Goals reviewed with patient? No  SHORT TERM GOALS: Target date: 08/07/2022  PT to be I in HEP in order to decrease her pain by  Baseline: Goal status: INITIAL  2.  PT ROM to improve to allow pt to be able to bend down to pick items off the floor with ease.  Baseline:  Goal status: {GOALSTATUS:25110}  3.  PT LE strength to be increased 1/2 grade to allow pt to be able to  Baseline:  Goal status: {GOALSTATUS:25110}  4.  *** Baseline:  Goal status: {GOALSTATUS:25110}  5.  *** Baseline:  Goal status: {GOALSTATUS:25110}  6.  *** Baseline:  Goal status: {GOALSTATUS:25110}  LONG TERM GOALS: Target date: {follow up:25551}  Pt pain to be decreased by  Baseline:  Goal status: {GOALSTATUS:25110}  2.  PT strength of LE to be increased by 1 grade to Baseline:  Goal status: {GOALSTATUS:25110}  3.  PT to be able to single leg stance for       seconds to demonstrate improved core strength to take stress of low back.  Baseline:  Goal status: {GOALSTATUS:25110}  4.  *** Baseline:  Goal status: {GOALSTATUS:25110}  5.  *** Baseline:  Goal status: {GOALSTATUS:25110}  6.  *** Baseline:  Goal status: {GOALSTATUS:25110}   PLAN: PT FREQUENCY: 2x/week  PT DURATION: 6 weeks  PLANNED INTERVENTIONS: Therapeutic exercises, Therapeutic activity, Neuromuscular re-education, Balance training, Gait training, Patient/Family  education, Self Care, and Manual therapy.  PLAN FOR NEXT SESSION: ***   Lorna Strother,CINDY, PT 07/17/2022, 11:40 AM

## 2022-07-21 ENCOUNTER — Ambulatory Visit (HOSPITAL_COMMUNITY): Payer: Medicare HMO | Admitting: Physical Therapy

## 2022-08-18 ENCOUNTER — Ambulatory Visit (HOSPITAL_COMMUNITY): Payer: Medicare HMO | Attending: Family Medicine

## 2022-08-18 DIAGNOSIS — M6281 Muscle weakness (generalized): Secondary | ICD-10-CM | POA: Insufficient documentation

## 2022-08-18 DIAGNOSIS — M545 Low back pain, unspecified: Secondary | ICD-10-CM | POA: Diagnosis not present

## 2022-08-18 DIAGNOSIS — M546 Pain in thoracic spine: Secondary | ICD-10-CM | POA: Insufficient documentation

## 2022-08-18 DIAGNOSIS — R262 Difficulty in walking, not elsewhere classified: Secondary | ICD-10-CM | POA: Insufficient documentation

## 2022-08-18 NOTE — Therapy (Signed)
OUTPATIENT PHYSICAL THERAPY THORACOLUMBAR EVALUATION   Patient Name: Shannon [name] MRN: [id number] DOB:[birth year], 56 y.o., female Today's Date: 08/18/2022  END OF SESSION:  PT End of Session - 08/18/22 1304     Visit Number 1    Number of Visits 8    Date for PT Re-Evaluation 09/15/22    Authorization Type [employer] HMO    Authorization Time Period please check auth submitted 08/18/22; no vl, no co-insurance, $20.00 copay    PT Start Time 1303    PT Stop Time 1345    PT Time Calculation (min) 42 min             Past Medical History:  Diagnosis Date   Anxiety    Arthritis    oa needs hip replacement on right   Asthma    allergies    Cancer ([place])    squamous cell areas removed from vulva and pre caner areas removed from leg    Carotid artery occlusion    Complication of anesthesia    major depression after general anesthesia   Coronary artery disease    LHC 11/25/12 90% stenosis mid LCx & otherwise nonobstructive dz w/ EF 60-65% S/p PTCA/DES to LCx   Depression    Dyspnea    with exertion    Elevated cholesterol    Exertional dyspnea    chronic   Fibromyalgia    GERD (gastroesophageal reflux disease)    History of cardiovascular stress test 06/2015   low risk   HPV in female    Hyperlipidemia    Hypertension    Liver abscess 09/09/2021   MI (myocardial infarction) ([place]) fe 17, 2014   Obesity    Pneumonia    hx of x 2 years ago    Polycystic ovary disease    Skin tear of upper arm without complication, left, sequela    small skin tear left upper arm no drainage for 1 week   Sleep apnea    mild no cpap    Tobacco abuse    Past Surgical History:  Procedure Laterality Date   CORONARY ANGIOPLASTY WITH STENT PLACEMENT     LEFT HEART CATH AND CORONARY ANGIOGRAPHY N/A 08/09/2018   Procedure: LEFT HEART CATH AND CORONARY ANGIOGRAPHY;  Surgeon: Nelva Bush, MD;  Location: [facility];  Service: Cardiovascular;  Laterality: N/A;   LEFT HEART  CATHETERIZATION WITH CORONARY ANGIOGRAM N/A 11/15/2012   Procedure: LEFT HEART CATHETERIZATION WITH CORONARY ANGIOGRAM;  Surgeon: Thayer Headings, MD;  Location: [facility] CATH LAB;  Service: Cardiovascular;  Laterality: N/A;   PERCUTANEOUS CORONARY STENT INTERVENTION (PCI-S)  11/15/2012   Procedure: PERCUTANEOUS CORONARY STENT INTERVENTION (PCI-S);  Surgeon: Thayer Headings, MD;  Location: [facility] CATH LAB;  Service: Cardiovascular;;   skin cancer removed right lower leg Right    TONSILLECTOMY     TOTAL HIP ARTHROPLASTY Right 01/16/2021   Procedure: TOTAL HIP ARTHROPLASTY ANTERIOR APPROACH;  Surgeon: Gaynelle Arabian, MD;  Location[facility];  Service: Orthopedics;  Laterality: Right;  187mn   uterus ablation     VULVECTOMY N/A 11/08/2019   Procedure: excision of VULVA, vulvoscopy;  Surgeon: GDian Queen MD;  [facility]  Service: Gynecology;  Laterality: N/A;   Patient Active Problem List   Diagnosis Date Noted   Liver abscess 09/09/2021   DVT (deep venous thrombosis) ([facility] 09/03/2021   Bacteremia due to Klebsiella pneumoniae 08/18/2021   Chest pain 08/18/2021   Atypical chest pain 08/17/2021  Hypotension 08/17/2021   Thrombocytopenia ([place]) 08/17/2021   Hyperglycemia 08/17/2021   Hyponatremia 08/17/2021   Hypokalemia 08/17/2021   Elevated troponin 08/17/2021   Obesity (BMI 30-39.9) 08/17/2021   Asthma 08/17/2021   Anxiety 08/17/2021   Primary osteoarthritis of right hip 01/16/2021   Chronic diastolic HF (heart failure) ([place]) 08/10/2018   GERD (gastroesophageal reflux disease) 08/09/2018   Unstable angina ([place]) 08/09/2018   Carotid artery disease ([place]) 07/24/2017   AAA (abdominal aortic aneurysm) without rupture ([place]) 01/05/2013   Other malaise and fatigue 01/05/2013   Palpitations 01/05/2013   Tobacco use disorder 12/02/2012   Coronary artery disease    Mixed hyperlipidemia    Essential hypertension    SHORTNESS OF BREATH 09/04/2009   CHEST PAIN 09/04/2009     PCP: Ammie Dalton, PA-C  REFERRING PROVIDER: Ammie Dalton, PA-C  REFERRING DIAG: PT eval/tx for chronic back pain and lower extremity weakness per Ammie Dalton, PA  Rationale for Evaluation and Treatment: Rehabilitation  THERAPY DIAG:  Low back pain, unspecified back pain laterality, unspecified chronicity, unspecified whether sciatica present  Difficulty in walking, not elsewhere classified  Muscle weakness (generalized)  Pain in thoracic spine  ONSET DATE: chronic, long time   SUBJECTIVE:                                                                                                                                                                                           SUBJECTIVE STATEMENT: History of chronic back pain; hx of arthritic; right hip already replaced and left hip needs to be done  PERTINENT HISTORY:  Right THA 12/2020; need left one done  PAIN:  Are you having pain? Yes: NPRS scale: 7-8/10 Pain location: mid and low back Pain description: grabbing pain; just hurts Aggravating factors: sitting for a long time, prolonged walking Relieving factors: lying down  PRECAUTIONS: None  WEIGHT BEARING RESTRICTIONS: No  FALLS:  Has patient fallen in last 6 months? Yes. Number of falls 1; needed A to get up  LIVING ENVIRONMENT: Lives with: lives with their spouse Lives in: House/apartment Stairs: Yes: Internal: 10-12 steps; on right going up and External: 1-2 steps; on right going up, on left going up, and can reach both Has following equipment at home: Single point cane and Walker - 2 wheeled  OCCUPATION: not currently working; on disability  PLOF: Independent  PATIENT GOALS: want to be able to function without so much pain  NEXT MD VISIT:   OBJECTIVE:   DIAGNOSTIC FINDINGS:  None recent  PATIENT SURVEYS:  FOTO 26  SCOGNITION: Overall cognitive status: Within functional limits for tasks assessed     SENSATION:  Numbness right  thigh  POSTURE: decreased lumbar lordosis and increased thoracic kyphosis  PALPATION: Patient reports tightness and tenderness mid back to low back paraspinals  LUMBAR ROM:   AROM eval  Flexion 55% *available  Extension 25% *availble  Right lateral flexion To 3" above knee  Left lateral flexion To 1" above knee  Right rotation   Left rotation    (Blank rows = not tested)   LOWER EXTREMITY MMT:    MMT Right eval Left eval  Hip flexion 5 3-*  Hip extension 4+ 3-  Hip abduction    Hip adduction    Hip internal rotation    Hip external rotation    Knee flexion    Knee extension 5 4+  Ankle dorsiflexion 5 5  Ankle plantarflexion    Ankle inversion    Ankle eversion     (Blank rows = not tested)    FUNCTIONAL TESTS:  5 times sit to stand: 29 sec  GAIT: Distance walked: 70 ft plus one flight of steps Assistive device utilized: None Level of assistance: Modified independence Comments: slower than normal gait speed  TODAY'S TREATMENT:                                                                                                                              DATE:  08/18/22 Physical therapy evaluation and HEP instruction    Education details: Patient educated on exam findings, POC, scope of PT, HEP, and good sitting posture education. Person educated: Patient Education method: Explanation, Demonstration, and Handouts Education comprehension: verbalized understanding, returned demonstration, verbal cues required, and tactile cues required   HOME EXERCISE PROGRAM: Access Code: [email or web address] URL: [email or web address] Date: 08/18/2022 Prepared by: AP - Rehab  Exercises - Lying Prone  - 2 x daily - 7 x weekly - 1 sets - 1 reps - 2-5 min hold - Prone Gluteal Sets  - 2 x daily - 7 x weekly - 1 sets - 10 reps - 5 sec hold - Supine Scapular Retraction  - 2 x daily - 7 x weekly - 1 sets - 10 reps - Supine Transversus Abdominis Bracing - Hands on Stomach  -  1 x daily - 7 x weekly - 3 sets - 10 reps  ASSESSMENT:  CLINICAL IMPRESSION: Patient is a 56 y.o. female who was seen today for physical therapy evaluation and treatment for PT eval/tx for chronic back pain and lower extremity weakness per Ammie Dalton, PA.  Patient demonstrates muscle weakness, reduced ROM, and fascial restrictions which are likely contributing to symptoms of pain and are negatively impacting patient ability to perform ADLs and functional mobility tasks. Patient will benefit from skilled physical therapy services to address these deficits to reduce pain and improve level of function with ADLs and functional mobility tasks.   OBJECTIVE IMPAIRMENTS: Abnormal gait, decreased activity tolerance, decreased endurance, decreased mobility, difficulty walking, decreased ROM, decreased strength, hypomobility, increased fascial restrictions, impaired perceived functional ability,  increased muscle spasms, impaired flexibility, and pain.   ACTIVITY LIMITATIONS: carrying, lifting, bending, sitting, standing, squatting, sleeping, stairs, transfers, bed mobility, bathing, toileting, dressing, hygiene/grooming, locomotion level, and caring for others  PARTICIPATION LIMITATIONS: meal prep, cleaning, laundry, driving, shopping, and community activity  REHAB POTENTIAL: Good  CLINICAL DECISION MAKING: Stable/uncomplicated  EVALUATION COMPLEXITY: Low   GOALS: Goals reviewed with patient? No  SHORT TERM GOALS: Target date: 09/01/2022  Patient will be independent with initial HEP  Baseline: Goal status: INITIAL  2.  Patient will demonstrate good sitting posture to be able to sit x 15' to ride in a car for community activities without pain > 3/10 in the back.  Baseline:  Goal status: INITIAL   LONG TERM GOALS: Target date: 09/15/2022  Patient will be independent in self management strategies to improve quality of life and functional outcomes.  Baseline:  Goal status:  INITIAL  2.  Patient will improve FOTO score to predicted value Baseline:  Goal status: INITIAL  3.   Patient will report at least 50% improvement in overall symptoms and/or function to demonstrate improved functional mobility   Baseline:  Goal status: INITIAL  4.  Patient will improve 5 times sit to stand score from 29 sec to 20 sec to demonstrate improved functional mobility and increased lower extremity strength.  Baseline:  Goal status: INITIAL  5.  Patient will increase lower extremity MMTs to 4+- 5/5 without pain to promote return to ambulation community distances with minimal deviation.  Baseline: see above Goal status: INITIAL  PLAN:  PT FREQUENCY: 2x/week  PT DURATION: 4 weeks  Therapeutic exercises, Therapeutic activity, Neuromuscular re-education, Balance training, Gait training, Patient/Family education, Joint manipulation, Joint mobilization, Stair training, Orthotic/Fit training, DME instructions, Aquatic Therapy, Dry Needling, Electrical stimulation, Spinal manipulation, Spinal mobilization, Cryotherapy, Moist heat, Compression bandaging, scar mobilization, Splintting, Taping, Traction, Ultrasound, Ionotophoresis '4mg'$ /ml Dexamethasone, and Manual therapy   PLAN FOR NEXT SESSION: Review HEP and goals; progress lumbar mobility and core/ postural strength as able.  Patient needs left THA   2:08 PM, 08/18/22 Brisia Schuermann Small Dvontae Ruan MPT [facility] [place] [phone]

## 2022-08-20 ENCOUNTER — Encounter: Payer: Self-pay | Admitting: Student

## 2022-08-20 ENCOUNTER — Ambulatory Visit: Payer: Medicare HMO | Attending: Student | Admitting: Student

## 2022-08-20 VITALS — BP 142/80 | HR 68 | Ht 64.5 in | Wt 249.6 lb

## 2022-08-20 DIAGNOSIS — E785 Hyperlipidemia, unspecified: Secondary | ICD-10-CM | POA: Diagnosis not present

## 2022-08-20 DIAGNOSIS — I10 Essential (primary) hypertension: Secondary | ICD-10-CM

## 2022-08-20 DIAGNOSIS — I2510 Atherosclerotic heart disease of native coronary artery without angina pectoris: Secondary | ICD-10-CM

## 2022-08-20 DIAGNOSIS — I6523 Occlusion and stenosis of bilateral carotid arteries: Secondary | ICD-10-CM

## 2022-08-20 DIAGNOSIS — R140 Abdominal distension (gaseous): Secondary | ICD-10-CM | POA: Diagnosis not present

## 2022-08-20 MED ORDER — FUROSEMIDE 20 MG PO TABS: 20.0000 mg | ORAL_TABLET | Freq: Every day | ORAL | 3 refills | Status: AC | PRN

## 2022-08-20 NOTE — Patient Instructions (Addendum)
Medication Instructions:  Your physician has recommended you make the following change in your medication:  -Start Lasix 20 mg tablet daily as needed for fluid/weight gain   Labwork: None  Testing/Procedures: None  Follow-Up: Follow up with Bernerd Pho, PA-C in 6 months.   Any Other Special Instructions Will Be Listed Below (If Applicable).  You have been referred to Pharm D. They will call you with your first appointment.    If you need a refill on your cardiac medications before your next appointment, please call your pharmacy.

## 2022-08-20 NOTE — Progress Notes (Signed)
Cardiology Office Note    Date:  08/20/2022   ID:  Akila, Batta [birth year], MRN [id number]  PCP:  Carolee Rota, NP  Cardiologist: Previously Dr. Bronson Ing and evaluated by Dr. Johney Frame in the interim  Chief Complaint  Patient presents with   Follow-up    Overdue Visit    History of Present Illness:    Shannon [name] is a 56 y.o. female with past medical history of CAD (s/p DES to LCx in 2014, cath in 07/2018 showing patent stent with mild to moderate nonobstructive disease involving the mid-LAD and RCA, low-risk NST in 11/2020), carotid artery stenosis (known LICA occlusion), HTN, HLD and tobacco use who presents to the office today for overdue follow-up.   She was last examined by Dr. Johney Frame in 08/2021 and reported being under a lot of stress at that time as she was currently on antibiotics for recently diagnosed bacteremia. She was continued on ASA, Crestor, Bisoprolol and HCTZ but was referred to the [facility] given her LDL at 103 by most recent labs. She was started on Zetia 10 mg daily as well. She had recently been diagnosed with a right upper extremity DVT in the setting of PICC placement and was continued on Eliquis 5 mg twice daily for 3 months. She was informed to follow-up in 6 months has not been evaluated by Cardiology since.  In talking with the patient today, she reports her mother passed away this summer due to pancreatic cancer and she has been suffering from depression since. Says that her activity level has decreased and she is consuming more "comfort foods". She has addressed this with her PCP and remains on Lexapro and they have recommended adding Wellbutrin as well. Reports having dyspnea on exertion and is unsure if this is due to weight gain or decreased activity.  She does feel more bloating in her abdomen along with intermittent lower extremity edema.  Reports occasional chest discomfort in the setting of coughing but no symptoms resembling her prior  angina. No recent orthopnea or PND.  Past Medical History:  Diagnosis Date   Anxiety    Arthritis    oa needs hip replacement on right   Asthma    allergies    Cancer ([place])    squamous cell areas removed from vulva and pre caner areas removed from leg    Carotid artery occlusion    Complication of anesthesia    major depression after general anesthesia   Coronary artery disease    LHC 11/25/12 90% stenosis mid LCx & otherwise nonobstructive dz w/ EF 60-65% S/p PTCA/DES to LCx   Depression    Dyspnea    with exertion    Elevated cholesterol    Exertional dyspnea    chronic   Fibromyalgia    GERD (gastroesophageal reflux disease)    History of cardiovascular stress test 06/2015   low risk   HPV in female    Hyperlipidemia    Hypertension    Liver abscess 09/09/2021   MI (myocardial infarction) ([place]) fe 17, 2014   Obesity    Pneumonia    hx of x 2 years ago    Polycystic ovary disease    Skin tear of upper arm without complication, left, sequela    small skin tear left upper arm no drainage for 1 week   Sleep apnea    mild no cpap    Tobacco abuse     Past Surgical History:  Procedure Laterality Date  CORONARY ANGIOPLASTY WITH STENT PLACEMENT     LEFT HEART CATH AND CORONARY ANGIOGRAPHY N/A 08/09/2018   Procedure: LEFT HEART CATH AND CORONARY ANGIOGRAPHY;  Surgeon: Nelva Bush, MD;  Location: [facility];  Service: Cardiovascular;  Laterality: N/A;   LEFT HEART CATHETERIZATION WITH CORONARY ANGIOGRAM N/A 11/15/2012   Procedure: LEFT HEART CATHETERIZATION WITH CORONARY ANGIOGRAM;  Surgeon: Thayer Headings, MD;  Location: [facility] CATH LAB;  Service: Cardiovascular;  Laterality: N/A;   PERCUTANEOUS CORONARY STENT INTERVENTION (PCI-S)  11/15/2012   Procedure: PERCUTANEOUS CORONARY STENT INTERVENTION (PCI-S);  Surgeon: Thayer Headings, MD;  Location: [facility] CATH LAB;  Service: Cardiovascular;;   skin cancer removed right lower leg Right    TONSILLECTOMY     TOTAL HIP  ARTHROPLASTY Right 01/16/2021   Procedure: TOTAL HIP ARTHROPLASTY ANTERIOR APPROACH;  Surgeon: Gaynelle Arabian, MD;  Location: [facility];  Service: Orthopedics;  Laterality: Right;  185mn   uterus ablation     VULVECTOMY N/A 11/08/2019   Procedure: excision of VULVA, vulvoscopy;  Surgeon: GDian Queen MD;  Location[place] [facility]  Service: Gynecology;  Laterality: N/A;    Current Medications: Outpatient Medications Prior to Visit  Medication Sig Dispense Refill   albuterol (PROVENTIL HFA;VENTOLIN HFA) 108 (90 Base) MCG/ACT inhaler Inhale 2 puffs into the lungs every 4 (four) hours as needed for wheezing or shortness of breath. 1 Inhaler 0   ALPRAZolam (XANAX) 1 MG tablet Take 1 mg by mouth 2 (two) times daily as needed for anxiety.  2   aspirin EC 81 MG tablet Take 1 tablet (81 mg total) by mouth daily. Swallow whole. 30 tablet 11   bisoprolol-hydrochlorothiazide (ZIAC) 5-6.25 MG tablet Take 1 tablet by mouth daily. Pt takes in the pm     escitalopram (LEXAPRO) 20 MG tablet Take 20 mg by mouth at bedtime. Takes in the pm     fluticasone (FLONASE) 50 MCG/ACT nasal spray Place 2 sprays into both nostrils at bedtime.     nitroGLYCERIN (NITROSTAT) 0.4 MG SL tablet Place 0.4 mg under the tongue every 5 (five) minutes as needed for chest pain.     oxyCODONE (OXY IR/ROXICODONE) 5 MG immediate release tablet Take 1 tablet (5 mg total) by mouth every 6 (six) hours as needed for severe pain or moderate pain. 15 tablet 0   pantoprazole (PROTONIX) 40 MG tablet Take 40 mg by mouth daily.     rosuvastatin (CRESTOR) 10 MG tablet Take 10 mg by mouth at bedtime.     triamcinolone cream (KENALOG) 0.1 % Apply 1 application topically 2 (two) times daily. (Patient taking differently: Apply 1 application  topically 2 (two) times daily as needed (irritation).) 454 g 0   carbamide peroxide (DEBROX) 6.5 % OTIC solution Place 5 drops into both ears 2 (two) times daily. (Patient not taking: Reported on  10/16/2021) 15 mL 0   apixaban (ELIQUIS) 5 MG TABS tablet Take 2 tablets ('10mg'$ ) twice daily for 7 days , on 09/12/21 start taking 1 tablet ('5mg'$ ) twice daily for at least 3 months (Patient not taking: Reported on 08/20/2022) 74 tablet 3   ezetimibe (ZETIA) 10 MG tablet Take 1 tablet (10 mg total) by mouth daily. (Patient not taking: Reported on 10/16/2021) 90 tablet 3   nicotine (NICODERM CQ - DOSED IN MG/24 HOURS) 21 mg/24hr patch Place 1 patch (21 mg total) onto the skin daily. (Patient not taking: Reported on 10/16/2021) 28 patch 0   No facility-administered medications prior to visit.  Allergies:   Clomiphene citrate, Doxycycline, Morphine and related, Other, Sulfa antibiotics, and Clomiphene   Social History   Socioeconomic History   Marital status: Legally Separated    Spouse name: Not on file   Number of children: Not on file   Years of education: Not on file   Highest education level: Not on file  Occupational History   Not on file  Tobacco Use   Smoking status: Every Day    Packs/day: 1.50    Years: 40.00    Total pack years: 60.00    Types: Cigarettes    Start date: 03/29/1979   Smokeless tobacco: Current   Tobacco comments:    Currently 2ppd  Vaping Use   Vaping Use: Never used  Substance and Sexual Activity   Alcohol use: Not Currently    Comment: rare   Drug use: No   Sexual activity: Not Currently    Partners: Male  Other Topics Concern   Not on file  Social History Narrative   Lives with husband in a 3 story home.  Has no children.     On disability.     Education: high school, some college.   Social Determinants of Health   Financial Resource Strain: Not on file  Food Insecurity: Not on file  Transportation Needs: Not on file  Physical Activity: Not on file  Stress: Not on file  Social Connections: Not on file     Family History:  The patient's family history includes Anxiety disorder in her maternal grandmother and paternal grandmother; Cerebral  aneurysm in her father; Depression in her mother and paternal grandmother; Heart attack in her father; Heart disease in her father; OCD in her paternal grandmother.   Review of Systems:    Please see the history of present illness.     All other systems reviewed and are otherwise negative except as noted above.   Physical Exam:    VS:  BP (!) 142/80   Pulse 68   Ht 5' 4.5" (1.638 m)   Wt 249 lb 9.6 oz (113.2 kg)   SpO2 98%   BMI 42.18 kg/m    General: Pleasant female appearing in no acute distress. Head: Normocephalic, atraumatic. Neck: No carotid bruits. JVD not elevated.  Lungs: Respirations regular and unlabored, without wheezes or rales.  Heart: Regular rate and rhythm. No S3 or S4.  No murmur, no rubs, or gallops appreciated. Abdomen: Appears mildly distended.  Msk:  Strength and tone appear normal for age. No obvious joint deformities or effusions. Extremities: No clubbing or cyanosis. Trace ankle edema bilaterally.  Distal pedal pulses are 2+ bilaterally. Neuro: Alert and oriented X 3. Moves all extremities spontaneously. No focal deficits noted. Psych:  Responds to questions appropriately with a normal affect. Skin: No rashes or lesions noted  Wt Readings from Last 3 Encounters:  08/20/22 249 lb 9.6 oz (113.2 kg)  11/05/21 230 lb (104.3 kg)  10/16/21 232 lb (105.2 kg)     Studies/Labs Reviewed:   EKG:  EKG is ordered today. The ekg ordered today demonstrates NSR, HR 68 with slight TWI along Lead III.   Recent Labs: 10/16/2021: ALT 25; BUN 7; Creat 0.89; Hemoglobin 14.1; Platelets 176; Potassium 3.9; Sodium 137   Lipid Panel    Component Value Date/Time   CHOL 194 08/10/2018 0539   TRIG 264 (H) 08/10/2018 0539   HDL 36 (L) 08/10/2018 0539   CHOLHDL 5.4 08/10/2018 0539   VLDL 53 (H) 08/10/2018 8338  LDLCALC 105 (H) 08/10/2018 0539   LDLDIRECT 200.3 09/04/2009 1308    Additional studies/ records that were reviewed today include:   NST: 11/2020 No  diagnostic ST segment changes to indicate ischemia. Small, mild intensity, mid to apical anteroseptal defect that is partially reversible suggestive of either variable soft tissue attenuation or a mild ischemic territory. This is a low risk study. Nuclear stress EF: 69%.  Echocardiogram: 07/2021 IMPRESSIONS     1. Left ventricular ejection fraction, by estimation, is 60 to 65%. The  left ventricle has normal function. The left ventricle has no regional  wall motion abnormalities. Left ventricular diastolic parameters were  normal.   2. Right ventricular systolic function is normal. The right ventricular  size is normal.   3. The mitral valve is normal in structure. No evidence of mitral valve  regurgitation. No evidence of mitral stenosis.   4. The aortic valve was not well visualized. Aortic valve regurgitation  is not visualized. No aortic stenosis is present.   5. The inferior vena cava is normal in size with greater than 50%  respiratory variability, suggesting right atrial pressure of 3 mmHg.   Carotid Dopplers: 10/2021 Summary:  Right Carotid: Velocities in the right ICA are consistent with a 80-99%                 stenosis.   Left Carotid: Evidence consistent with a total occlusion of the left ICA.   Vertebrals:  Bilateral vertebral arteries demonstrate antegrade flow.  Subclavians: Normal flow hemodynamics were seen in bilateral subclavian               arteries.   CTA Neck: 10/2021 IMPRESSION: Continued normal appearance of the brain itself.   Atherosclerosis of the aorta, mild.   Atherosclerotic disease at the right carotid bifurcation and ICA bulb, with maximal stenosis of only 35%.   Advanced atherosclerotic disease of the left ICA bulb with complete occlusion presently. Reconstitution of the cervical internal carotid artery via the ascending pharyngeal artery. The vessel is patent as a small vessel through the upper cervical region, skull base and siphon  region.   No intracranial large vessel occlusion or correctable proximal stenosis.   Vertebral artery origins are not well seen because of shoulder density.    Assessment:    1. Coronary artery disease involving native coronary artery of native heart without angina pectoris   2. Abdominal distension   3. Bilateral carotid artery stenosis   4. Essential hypertension   5. Hyperlipidemia LDL goal <70      Plan:   In order of problems listed above:  1. CAD - She is s/p DES to LCx in 2014 and cath in 07/2018 showed a patent stent with mild to moderate nonobstructive disease involving the mid-LAD and RCA. Most recent ischemic evaluation was a low-risk NST in 11/2020. - No recent symptoms resembling her prior angina. Continue current medical therapy with ASA 81 mg daily, Bisoprolol 5 mg daily and Crestor 10 mg daily. Will refer for consideration of PCSK9 inhibitor therapy as discussed below.  2. Carotid Artery Stenosis  - Followed by Vascular Surgery and she has a known LICA occlusion. CTA in 10/2021 showed atherosclerotic disease of the right carotid bifurcation and ICA bulb with maximal stenosis of only 35%. She remains on ASA 81 mg daily and Crestor 10 mg daily.  3. Abdominal Distension - She reports worsening symptoms over the past few months and I suspect this is secondary to dietary indiscretion and  increased sodium intake. She previously had a prescription for as needed Lasix but no longer has this at home. Will provide with an updated Rx to take '20mg'$  as needed for weight gain or worsening edema. Advised her not to take daily given the concurrent use of HCTZ. I encouraged her to make [place] aware if symptoms do not improve as we could arrange for a follow-up echocardiogram.  4. HTN - Her BP is at 142/80 during today's visit. She is currently on Bisoprolol-HCTZ 5-6.25 mg daily.  We did discuss the importance of limiting sodium intake and she will start taking Lasix '20mg'$  as needed. I  encouraged her to follow readings at home and reach out if BP remains elevated as Ziac could be further titrated.  5. HLD - FLP in 04/2022 showed total cholesterol 184, triglycerides 214, HDL 51 and LDL 97. She has been intolerant to higher intensity statin therapy due to myalgias and is currently taking Crestor 10 mg daily. We reviewed options in regards to starting Zetia 10 mg daily or referral to the [facility] as previously discussed at her last office visit and she does wish to pursue PCSK9 inhibitor therapy if approved by her insurance. Will refer to the [facility] today.   Medication Adjustments/Labs and Tests Ordered: Current medicines are reviewed at length with the patient today.  Concerns regarding medicines are outlined above.  Medication changes, Labs and Tests ordered today are listed in the Patient Instructions below. Patient Instructions  Medication Instructions:  Your physician has recommended you make the following change in your medication:  -Start Lasix 20 mg tablet daily as needed for fluid/weight gain   Labwork: None  Testing/Procedures: None  Follow-Up: Follow up with Bernerd Pho, PA-C in 6 months.   Any Other Special Instructions Will Be Listed Below (If Applicable).  You have been referred to Pharm D. They will call you with your first appointment.    If you need a refill on your cardiac medications before your next appointment, please call your pharmacy.    Signed, Erma Heritage, PA-C  08/20/2022 2:46 PM    [facility] [address] [place], [place] [address] Phone: [phone] Fax: [phone]

## 2022-08-26 ENCOUNTER — Ambulatory Visit (HOSPITAL_COMMUNITY): Payer: Medicare HMO

## 2022-09-02 ENCOUNTER — Encounter (HOSPITAL_COMMUNITY): Payer: Medicare HMO

## 2022-09-05 ENCOUNTER — Encounter (HOSPITAL_COMMUNITY): Payer: Self-pay

## 2022-09-05 ENCOUNTER — Ambulatory Visit (HOSPITAL_COMMUNITY): Payer: Medicare HMO | Attending: Family Medicine

## 2022-09-05 DIAGNOSIS — M6281 Muscle weakness (generalized): Secondary | ICD-10-CM | POA: Insufficient documentation

## 2022-09-05 DIAGNOSIS — R262 Difficulty in walking, not elsewhere classified: Secondary | ICD-10-CM | POA: Insufficient documentation

## 2022-09-05 DIAGNOSIS — M545 Low back pain, unspecified: Secondary | ICD-10-CM | POA: Insufficient documentation

## 2022-09-05 DIAGNOSIS — M546 Pain in thoracic spine: Secondary | ICD-10-CM | POA: Diagnosis not present

## 2022-09-05 NOTE — Therapy (Signed)
OUTPATIENT PHYSICAL THERAPY THORACOLUMBAR EVALUATION   Patient Name: Shannon [name] MRN: [id number] DOB:1966/07/27, 56 y.o., female Today's Date: 09/05/2022  END OF SESSION:  PT End of Session - 09/05/22 1458     Visit Number 2    Number of Visits 8    Date for PT Re-Evaluation 09/15/22    Authorization Type [employer] H[employer]    Authorization Time Period 8 visits approved 11/20-->09/15/22    PT Start Time 1438   late sign-in   PT Stop Time 1516    PT Time Calculation (min) 38 min    Activity Tolerance Patient tolerated treatment well    Behavior During Therapy WFL for tasks assessed/performed              Past Medical History:  Diagnosis Date   Anxiety    Arthritis    oa needs hip replacement on right   Asthma    allergies    Cancer ([place])    squamous cell areas removed from vulva and pre caner areas removed from leg    Carotid artery occlusion    Complication of anesthesia    major depression after general anesthesia   Coronary artery disease    LHC 11/25/12 90% stenosis mid LCx & otherwise nonobstructive dz w/ EF 60-65% S/p PTCA/DES to LCx   Depression    Dyspnea    with exertion    Elevated cholesterol    Exertional dyspnea    chronic   Fibromyalgia    GERD (gastroesophageal reflux disease)    History of cardiovascular stress test 06/2015   low risk   HPV in female    Hyperlipidemia    Hypertension    Liver abscess 09/09/2021   MI (myocardial infarction) ([place]) fe 17, 2014   Obesity    Pneumonia    hx of x 2 years ago    Polycystic ovary disease    Skin tear of upper arm without complication, left, sequela    small skin tear left upper arm no drainage for 1 week   Sleep apnea    mild no cpap    Tobacco abuse    Past Surgical History:  Procedure Laterality Date   CORONARY ANGIOPLASTY WITH STENT PLACEMENT     LEFT HEART CATH AND CORONARY ANGIOGRAPHY N/A 08/09/2018   Procedure: LEFT HEART CATH AND CORONARY ANGIOGRAPHY;  Surgeon: Nelva Bush,  MD;  Location: [facility];  Service: Cardiovascular;  Laterality: N/A;   LEFT HEART CATHETERIZATION WITH CORONARY ANGIOGRAM N/A 11/15/2012   Procedure: LEFT HEART CATHETERIZATION WITH CORONARY ANGIOGRAM;  Surgeon: Thayer Headings, MD;  Location: [facility] CATH LAB;  Service: Cardiovascular;  Laterality: N/A;   PERCUTANEOUS CORONARY STENT INTERVENTION (PCI-S)  11/15/2012   Procedure: PERCUTANEOUS CORONARY STENT INTERVENTION (PCI-S);  Surgeon: Thayer Headings, MD;  Location: [facility] CATH LAB;  Service: Cardiovascular;;   skin cancer removed right lower leg Right    TONSILLECTOMY     TOTAL HIP ARTHROPLASTY Right 01/16/2021   Procedure: TOTAL HIP ARTHROPLASTY ANTERIOR APPROACH;  Surgeon: Gaynelle Arabian, MD;  Location: [facility];  Service: Orthopedics;  Laterality: Right;  129mn   uterus ablation     VULVECTOMY N/A 11/08/2019   Procedure: excision of VULVA, vulvoscopy;  Surgeon: GDian Queen MD;  Location: [facility]  Service: Gynecology;  Laterality: N/A;   Patient Active Problem List   Diagnosis Date Noted   Liver abscess 09/09/2021   DVT (deep venous thrombosis) ([place] 09/03/2021   Bacteremia due to  Klebsiella pneumoniae 08/18/2021   Chest pain 08/18/2021   Atypical chest pain 08/17/2021   Hypotension 08/17/2021   Thrombocytopenia ([facility]) 08/17/2021   Hyperglycemia 08/17/2021   Hyponatremia 08/17/2021   Hypokalemia 08/17/2021   Elevated troponin 08/17/2021   Obesity (BMI 30-39.9) 08/17/2021   Asthma 08/17/2021   Anxiety 08/17/2021   Primary osteoarthritis of right hip 01/16/2021   Chronic diastolic HF (heart failure) ([place]) 08/10/2018   GERD (gastroesophageal reflux disease) 08/09/2018   Unstable angina ([place]) 08/09/2018   Carotid artery disease ([place]) 07/24/2017   AAA (abdominal aortic aneurysm) without rupture ([place]) 01/05/2013   Other malaise and fatigue 01/05/2013   Palpitations 01/05/2013   Tobacco use disorder 12/02/2012   Coronary artery disease    Mixed hyperlipidemia     Essential hypertension    SHORTNESS OF BREATH 09/04/2009   CHEST PAIN 09/04/2009    PCP: Ammie Dalton, PA-C  REFERRING PROVIDER: Ammie Dalton, PA-C  REFERRING DIAG: PT eval/tx for chronic back pain and lower extremity weakness per Ammie Dalton, PA  Rationale for Evaluation and Treatment: Rehabilitation  THERAPY DIAG:  Low back pain, unspecified back pain laterality, unspecified chronicity, unspecified whether sciatica present  Difficulty in walking, not elsewhere classified  Muscle weakness (generalized)  Pain in thoracic spine  ONSET DATE: chronic, long time   SUBJECTIVE:                                                                                                                                                                                           SUBJECTIVE STATEMENT: 09/05/22:  Pt reports mid back pain scale 7/10 almost constant.  Has began some of the exercises.  Eval subjective: History of chronic back pain; hx of arthritic; right hip already replaced and left hip needs to be done  PERTINENT HISTORY:  Right THA 12/2020; need left one done  PAIN:  Are you having pain? Yes: NPRS scale: 7-8/10 Pain location: mid and low back Pain description: grabbing pain; just hurts Aggravating factors: sitting for a long time, prolonged walking Relieving factors: lying down  PRECAUTIONS: None  WEIGHT BEARING RESTRICTIONS: No  FALLS:  Has patient fallen in last 6 months? Yes. Number of falls 1; needed A to get up  LIVING ENVIRONMENT: Lives with: lives with their spouse Lives in: House/apartment Stairs: Yes: Internal: 10-12 steps; on right going up and External: 1-2 steps; on right going up, on left going up, and can reach both Has following equipment at home: Single point cane and Walker - 2 wheeled  OCCUPATION: not currently working; on disability  PLOF: Independent  PATIENT GOALS: want to be able to function without so much  pain  NEXT MD VISIT:    OBJECTIVE:   DIAGNOSTIC FINDINGS:  None recent  PATIENT SURVEYS:  FOTO 69  SCOGNITION: Overall cognitive status: Within functional limits for tasks assessed     SENSATION: Numbness right thigh  POSTURE: decreased lumbar lordosis and increased thoracic kyphosis  PALPATION: Patient reports tightness and tenderness mid back to low back paraspinals  LUMBAR ROM:   AROM eval  Flexion 55% *available  Extension 25% *availble  Right lateral flexion To 3" above knee  Left lateral flexion To 1" above knee  Right rotation   Left rotation    (Blank rows = not tested)   LOWER EXTREMITY MMT:    MMT Right eval Left eval  Hip flexion 5 3-*  Hip extension 4+ 3-  Hip abduction    Hip adduction    Hip internal rotation    Hip external rotation    Knee flexion    Knee extension 5 4+  Ankle dorsiflexion 5 5  Ankle plantarflexion    Ankle inversion    Ankle eversion     (Blank rows = not tested)    FUNCTIONAL TESTS:  5 times sit to stand: 29 sec  GAIT: Distance walked: 70 ft plus one flight of steps Assistive device utilized: None Level of assistance: Modified independence Comments: slower than normal gait speed  TODAY'S TREATMENT:                                                                                                                              DATE:  09/05/22: Reviewed goals and importance of HEP compliance for maximal benefits Supine: Scapular retraction -HEP Transverse abdominis paired with exhale 10x 5"- HEP Decompression 2-5 5x 5" Prone: Reviewed benefits with prone position- HEP Prone on elbow x 1 min Glut set 10x 5" Heel squeeze 5x 5" Importance of seated posture Seated scapular retraction    08/18/22 Physical therapy evaluation and HEP instruction    Education details: Patient educated on exam findings, POC, scope of PT, HEP, and good sitting posture education. Person educated: Patient Education method: Explanation, Demonstration,  and Handouts Education comprehension: verbalized understanding, returned demonstration, verbal cues required, and tactile cues required   HOME EXERCISE PROGRAM: Access Code: [email or web address] URL: [email or web address] Date: 08/18/2022 Prepared by: AP - Rehab  Exercises - Lying Prone  - 2 x daily - 7 x weekly - 1 sets - 1 reps - 2-5 min hold - Prone Gluteal Sets  - 2 x daily - 7 x weekly - 1 sets - 10 reps - 5 sec hold - Supine Scapular Retraction  - 2 x daily - 7 x weekly - 1 sets - 10 reps - Supine Transversus Abdominis Bracing - Hands on Stomach  - 1 x daily - 7 x weekly - 3 sets - 10 reps  Access Code: [email or web address] URL: [email or web address] Date: 09/05/2022 Prepared by: Ihor Austin  Exercises - Lying Prone  - 2 x daily - 7 x  weekly - 1 sets - 1 reps - 2-5 min hold - Prone Gluteal Sets  - 2 x daily - 7 x weekly - 1 sets - 10 reps - 5 sec hold - Supine Scapular Retraction  - 2 x daily - 7 x weekly - 1 sets - 10 reps - Supine Transversus Abdominis Bracing - Hands on Stomach  - 1 x daily - 7 x weekly - 3 sets - 10 reps - Prone on Elbows Stretch  - 2 x daily - 7 x weekly - 1 sets - 1 reps - 2-5' hold - Correct Seated Posture  - 1 x daily - 7 x weekly - 3 sets - 10 reps - Seated Scapular Retraction  - 1 x daily - 7 x weekly - 2 sets - 10 reps - 5" hold  ASSESSMENT:  CLINICAL IMPRESSION: Reviewed goals, educated importance of HEP compliance for maximal benefits, pt able to recall supine scapular retraction and abdominal sets, admits to not completing all home exercises as some difficulty with hip mobility causing increased time with prone exercises.  Session focus on proximal strengthening and core stability.  Added decompression exercises for postural strengthening and some gluteal strengthening exercises with good tolerance.  Pt did require cueing for breathing through all exercises.  Educated importance of seated posture with additional seated exercises added to  HEP.  Pt reports pain reduced at EOS.  OBJECTIVE IMPAIRMENTS: Abnormal gait, decreased activity tolerance, decreased endurance, decreased mobility, difficulty walking, decreased ROM, decreased strength, hypomobility, increased fascial restrictions, impaired perceived functional ability, increased muscle spasms, impaired flexibility, and pain.   ACTIVITY LIMITATIONS: carrying, lifting, bending, sitting, standing, squatting, sleeping, stairs, transfers, bed mobility, bathing, toileting, dressing, hygiene/grooming, locomotion level, and caring for others  PARTICIPATION LIMITATIONS: meal prep, cleaning, laundry, driving, shopping, and community activity  REHAB POTENTIAL: Good  CLINICAL DECISION MAKING: Stable/uncomplicated  EVALUATION COMPLEXITY: Low   GOALS: Goals reviewed with patient? No  SHORT TERM GOALS: Target date: 09/01/2022  Patient will be independent with initial HEP  Baseline: Goal status: IN PROGRESS  2.  Patient will demonstrate good sitting posture to be able to sit x 15' to ride in a car for community activities without pain > 3/10 in the back.  Baseline:  Goal status: IN PROGRESS   LONG TERM GOALS: Target date: 09/15/2022  Patient will be independent in self management strategies to improve quality of life and functional outcomes.  Baseline:  Goal status: IN PROGRESS  2.  Patient will improve FOTO score to predicted value Baseline:  Goal status: IN PROGRESS  3.   Patient will report at least 50% improvement in overall symptoms and/or function to demonstrate improved functional mobility   Baseline:  Goal status: IN PROGRESS  4.  Patient will improve 5 times sit to stand score from 29 sec to 20 sec to demonstrate improved functional mobility and increased lower extremity strength.  Baseline:  Goal status: IN PROGRESS  5.  Patient will increase lower extremity MMTs to 4+- 5/5 without pain to promote return to ambulation community distances with minimal  deviation.  Baseline: see above Goal status: IN PROGRESS  PLAN:  PT FREQUENCY: 2x/week  PT DURATION: 4 weeks  Therapeutic exercises, Therapeutic activity, Neuromuscular re-education, Balance training, Gait training, Patient/Family education, Joint manipulation, Joint mobilization, Stair training, Orthotic/Fit training, DME instructions, Aquatic Therapy, Dry Needling, Electrical stimulation, Spinal manipulation, Spinal mobilization, Cryotherapy, Moist heat, Compression bandaging, scar mobilization, Splintting, Taping, Traction, Ultrasound, Ionotophoresis '4mg'$ /ml Dexamethasone, and Manual therapy   PLAN  FOR NEXT SESSION: progress lumbar mobility and core/ postural strength as able.  Patient needs left THA   3:19 PM, 09/05/22 Ihor Austin, LPTA/CLT; Delana Meyer [phone]

## 2022-09-09 ENCOUNTER — Encounter (HOSPITAL_COMMUNITY): Payer: Medicare HMO

## 2022-09-11 ENCOUNTER — Encounter (HOSPITAL_COMMUNITY): Payer: Medicare HMO

## 2022-09-17 ENCOUNTER — Ambulatory Visit (HOSPITAL_COMMUNITY): Payer: Medicare HMO

## 2022-09-17 DIAGNOSIS — M6281 Muscle weakness (generalized): Secondary | ICD-10-CM

## 2022-09-17 DIAGNOSIS — M546 Pain in thoracic spine: Secondary | ICD-10-CM | POA: Diagnosis not present

## 2022-09-17 DIAGNOSIS — M545 Low back pain, unspecified: Secondary | ICD-10-CM | POA: Diagnosis not present

## 2022-09-17 DIAGNOSIS — R262 Difficulty in walking, not elsewhere classified: Secondary | ICD-10-CM

## 2022-09-17 NOTE — Therapy (Signed)
OUTPATIENT PHYSICAL THERAPY THORACOLUMBAR PROGRESS NOTE Progress Note Reporting Period 08/18/2022 to 09/17/2022  See note below for Objective Data and Assessment of Progress/Goals.       Patient Name: Shannon [name] MRN: [id number] DOB:04/12/1966, 56 y.o., female Today's Date: 09/17/2022  END OF SESSION:  PT End of Session - 09/17/22 1348     Visit Number 3    Number of Visits 8    Date for PT Re-Evaluation 09/15/22    Authorization Type [employer] H[employer]    Authorization Time Period 8 visits approved 11/20-->09/15/22    PT Start Time 8242    PT Stop Time 3536    PT Time Calculation (min) 40 min    Activity Tolerance Patient tolerated treatment well    Behavior During Therapy [facility] for tasks assessed/performed               Past Medical History:  Diagnosis Date   Anxiety    Arthritis    oa needs hip replacement on right   Asthma    allergies    Cancer ([place])    squamous cell areas removed from vulva and pre caner areas removed from leg    Carotid artery occlusion    Complication of anesthesia    major depression after general anesthesia   Coronary artery disease    LHC 11/25/12 90% stenosis mid LCx & otherwise nonobstructive dz w/ EF 60-65% S/p PTCA/DES to LCx   Depression    Dyspnea    with exertion    Elevated cholesterol    Exertional dyspnea    chronic   Fibromyalgia    GERD (gastroesophageal reflux disease)    History of cardiovascular stress test 06/2015   low risk   HPV in female    Hyperlipidemia    Hypertension    Liver abscess 09/09/2021   MI (myocardial infarction) ([place]) fe 17, 2014   Obesity    Pneumonia    hx of x 2 years ago    Polycystic ovary disease    Skin tear of upper arm without complication, left, sequela    small skin tear left upper arm no drainage for 1 week   Sleep apnea    mild no cpap    Tobacco abuse    Past Surgical History:  Procedure Laterality Date   CORONARY ANGIOPLASTY WITH STENT PLACEMENT     LEFT HEART  CATH AND CORONARY ANGIOGRAPHY N/A 08/09/2018   Procedure: LEFT HEART CATH AND CORONARY ANGIOGRAPHY;  Surgeon: Nelva Bush, MD;  Location: [facility];  Service: Cardiovascular;  Laterality: N/A;   LEFT HEART CATHETERIZATION WITH CORONARY ANGIOGRAM N/A 11/15/2012   Procedure: LEFT HEART CATHETERIZATION WITH CORONARY ANGIOGRAM;  Surgeon: Thayer Headings, MD;  Location: [facility] CATH LAB;  Service: Cardiovascular;  Laterality: N/A;   PERCUTANEOUS CORONARY STENT INTERVENTION (PCI-S)  11/15/2012   Procedure: PERCUTANEOUS CORONARY STENT INTERVENTION (PCI-S);  Surgeon: Thayer Headings, MD;  Location: [facility];  Service: Cardiovascular;;   skin cancer removed right lower leg Right    TONSILLECTOMY     TOTAL HIP ARTHROPLASTY Right 01/16/2021   Procedure: TOTAL HIP ARTHROPLASTY ANTERIOR APPROACH;  Surgeon: Gaynelle Arabian, MD;  Location: [facility];  Service: Orthopedics;  Laterality: Right;  17mn   uterus ablation     VULVECTOMY N/A 11/08/2019   Procedure: excision of VULVA, vulvoscopy;  Surgeon: GDian Queen MD;  Location: [facility]  Service: Gynecology;  Laterality: N/A;   Patient Active Problem List  Diagnosis Date Noted   Liver abscess 09/09/2021   DVT (deep venous thrombosis) ([place]) 09/03/2021   Bacteremia due to Klebsiella pneumoniae 08/18/2021   Chest pain 08/18/2021   Atypical chest pain 08/17/2021   Hypotension 08/17/2021   Thrombocytopenia ([facility]) 08/17/2021   Hyperglycemia 08/17/2021   Hyponatremia 08/17/2021   Hypokalemia 08/17/2021   Elevated troponin 08/17/2021   Obesity (BMI 30-39.9) 08/17/2021   Asthma 08/17/2021   Anxiety 08/17/2021   Primary osteoarthritis of right hip 01/16/2021   Chronic diastolic HF (heart failure) ([place]) 08/10/2018   GERD (gastroesophageal reflux disease) 08/09/2018   Unstable angina ([place]) 08/09/2018   Carotid artery disease ([place]) 07/24/2017   AAA (abdominal aortic aneurysm) without rupture ([place]) 01/05/2013   Other malaise and  fatigue 01/05/2013   Palpitations 01/05/2013   Tobacco use disorder 12/02/2012   Coronary artery disease    Mixed hyperlipidemia    Essential hypertension    SHORTNESS OF BREATH 09/04/2009   CHEST PAIN 09/04/2009    PCP: Ammie Dalton, PA-C  REFERRING PROVIDER: Ammie Dalton, PA-C  REFERRING DIAG: PT eval/tx for chronic back pain and lower extremity weakness per Ammie Dalton, PA  Rationale for Evaluation and Treatment: Rehabilitation  THERAPY DIAG:  Low back pain, unspecified back pain laterality, unspecified chronicity, unspecified whether sciatica present - Plan: PT plan of care cert/re-cert  Difficulty in walking, not elsewhere classified - Plan: PT plan of care cert/re-cert  Muscle weakness (generalized) - Plan: PT plan of care cert/re-cert  Pain in thoracic spine - Plan: PT plan of care cert/re-cert  ONSET DATE: chronic, long time   SUBJECTIVE:                                                                                                                                                                                           SUBJECTIVE STATEMENT:  "Hurt really bad when I left last time"; took pain meds so not too bad at this moment; mid to low back; have been trying to do some of the exercises; trying to be more aware of sitting posture.  Feels she is about the same.  Has a memory foam mattress so has been unable to lie prone and has trouble with all exercise on that mattress.   Eval subjective: History of chronic back pain; hx of arthritic; right hip already replaced and left hip needs to be done  PERTINENT HISTORY:  Right THA 12/2020; need left one done  PAIN:  Are you having pain? Yes: NPRS scale: 5/10 Pain location: mid and low back Pain description: grabbing pain; just hurts Aggravating factors: sitting for a long time, prolonged walking Relieving factors: lying  down  PRECAUTIONS: None  WEIGHT BEARING RESTRICTIONS: No  FALLS:  Has patient fallen in  last 6 months? Yes. Number of falls 1; needed A to get up  LIVING ENVIRONMENT: Lives with: lives with their spouse Lives in: House/apartment Stairs: Yes: Internal: 10-12 steps; on right going up and External: 1-2 steps; on right going up, on left going up, and can reach both Has following equipment at home: Single point cane and Walker - 2 wheeled  OCCUPATION: not currently working; on disability  PLOF: Independent  PATIENT GOALS: want to be able to function without so much pain  NEXT MD VISIT:   OBJECTIVE:   DIAGNOSTIC FINDINGS:  None recent  PATIENT SURVEYS:  FOTO 26  SCOGNITION: Overall cognitive status: Within functional limits for tasks assessed     SENSATION: Numbness right thigh  POSTURE: decreased lumbar lordosis and increased thoracic kyphosis  PALPATION: Patient reports tightness and tenderness mid back to low back paraspinals  LUMBAR ROM:   AROM eval 09/17/22  Flexion 55% *available 65% available* pullin  Extension 25% *availble 25% available* most painful  Right lateral flexion To 3" above knee   Left lateral flexion To 1" above knee   Right rotation    Left rotation     (Blank rows = not tested)   LOWER EXTREMITY MMT:    MMT Right eval Left eval Right 09/17/22 Left 09/17/22  Hip flexion 5 3-* 5 4-  Hip extension 4+ 3- 3- 4+  Hip abduction      Hip adduction      Hip internal rotation      Hip external rotation      Knee flexion   4- 4+  Knee extension 5 4+ 5 4+  Ankle dorsiflexion _0 Ankle plantarflexion      Ankle inversion      Ankle eversion       (Blank rows = not tested)    FUNCTIONAL TESTS:  5 times sit to stand: 29 sec  GAIT: Distance walked: 70 ft plus one flight of steps Assistive device utilized: None Level of assistance: Modified independence Comments: slower than normal gait speed  TODAY'S TREATMENT:                                                                                                                               DATE:  09/17/22 Progress note 5 x sit to stand 24.4 sec FOTO 24 MMT's and AROM  Supine: LTR x 10 (pulling in left hip) Abdominal bracing 5" hold x 10 Decompression exercise head press, shoulder press, leg press, leg lengthener  5" hold x 10 Bridge x 8  Seated: Scapular retractions x 10 Cervical retraction with extension x 10       09/05/22: Reviewed goals and importance of HEP compliance for maximal benefits Supine: Scapular retraction -HEP Transverse abdominis paired with exhale 10x 5"- HEP Decompression 2-5 5x 5" Prone: Reviewed benefits with prone  position- HEP Prone on elbow x 1 min Glut set 10x 5" Heel squeeze 5x 5" Importance of seated posture Seated scapular retraction    08/18/22 Physical therapy evaluation and HEP instruction    Education details: Patient educated on exam findings, POC, scope of PT, HEP, and good sitting posture education. Person educated: Patient Education method: Explanation, Demonstration, and Handouts Education comprehension: verbalized understanding, returned demonstration, verbal cues required, and tactile cues required   HOME EXERCISE PROGRAM: Access Code: [email or web address] URL: [email or web address] Date: 08/18/2022 Prepared by: [facility] - Rehab  Exercises - Lying Prone  - 2 x daily - 7 x weekly - 1 sets - 1 reps - 2-5 min hold - Prone Gluteal Sets  - 2 x daily - 7 x weekly - 1 sets - 10 reps - 5 sec hold - Supine Scapular Retraction  - 2 x daily - 7 x weekly - 1 sets - 10 reps - Supine Transversus Abdominis Bracing - Hands on Stomach  - 1 x daily - 7 x weekly - 3 sets - 10 reps  Access Code: [email or web address] URL: [email or web address] Date: 09/05/2022 Prepared by: Ihor Austin  Exercises - Lying Prone  - 2 x daily - 7 x weekly - 1 sets - 1 reps - 2-5 min hold - Prone Gluteal Sets  - 2 x daily - 7 x weekly - 1 sets - 10 reps - 5 sec hold - Supine Scapular Retraction  - 2 x daily - 7 x weekly  - 1 sets - 10 reps - Supine Transversus Abdominis Bracing - Hands on Stomach  - 1 x daily - 7 x weekly - 3 sets - 10 reps - Prone on Elbows Stretch  - 2 x daily - 7 x weekly - 1 sets - 1 reps - 2-5' hold - Correct Seated Posture  - 1 x daily - 7 x weekly - 3 sets - 10 reps - Seated Scapular Retraction  - 1 x daily - 7 x weekly - 2 sets - 10 reps - 5" hold  ASSESSMENT:  CLINICAL IMPRESSION: Progress note today; minimal change in FOTO; slight improvement with sit to stand test. Noted weakness in legs continues especially left hip.  Continues overall with high pain levels.  Discussed with patient trying some exercise in the pool to take some pressure off her joints as she is suffering with her left hip that needs to be replaced.  She states her insurance covers a [employer].  Patient with minimal change overall but this is only her 3rd visit of physical therapy.  Patient will benefit from continued skilled therapy services to address remaining unmet and partially met goals.     OBJECTIVE IMPAIRMENTS: Abnormal gait, decreased activity tolerance, decreased endurance, decreased mobility, difficulty walking, decreased ROM, decreased strength, hypomobility, increased fascial restrictions, impaired perceived functional ability, increased muscle spasms, impaired flexibility, and pain.   ACTIVITY LIMITATIONS: carrying, lifting, bending, sitting, standing, squatting, sleeping, stairs, transfers, bed mobility, bathing, toileting, dressing, hygiene/grooming, locomotion level, and caring for others  PARTICIPATION LIMITATIONS: meal prep, cleaning, laundry, driving, shopping, and community activity  REHAB POTENTIAL: Good  CLINICAL DECISION MAKING: Stable/uncomplicated  EVALUATION COMPLEXITY: Low   GOALS: Goals reviewed with patient? No  SHORT TERM GOALS: Target date: 09/01/2022  Patient will be independent with initial HEP  Baseline: Goal status: IN PROGRESS  2.  Patient will demonstrate good  sitting posture to be able to sit x 15' to ride in a car for community activities without pain > 3/10 in  the back.  Baseline: more aware of sitting posture Goal status: IN PROGRESS   LONG TERM GOALS: Target date: 09/15/2022  Patient will be independent in self management strategies to improve quality of life and functional outcomes.  Baseline:  Goal status: IN PROGRESS  2.  Patient will improve FOTO score to predicted value Baseline: 24 Goal status: IN PROGRESS  3.   Patient will report at least 50% improvement in overall symptoms and/or function to demonstrate improved functional mobility   Baseline:  Goal status: IN PROGRESS  4.  Patient will improve 5 times sit to stand score from 29 sec to 20 sec to demonstrate improved functional mobility and increased lower extremity strength.  Baseline: 09/17/22 24.4 sec Goal status: IN PROGRESS  5.  Patient will increase lower extremity MMTs to 4+- 5/5 without pain to promote return to ambulation community distances with minimal deviation.  Baseline: see above Goal status: IN PROGRESS  PLAN:  PT FREQUENCY: 2x/week  PT DURATION: 4 weeks  Therapeutic exercises, Therapeutic activity, Neuromuscular re-education, Balance training, Gait training, Patient/Family education, Joint manipulation, Joint mobilization, Stair training, Orthotic/Fit training, DME instructions, Aquatic Therapy, Dry Needling, Electrical stimulation, Spinal manipulation, Spinal mobilization, Cryotherapy, Moist heat, Compression bandaging, scar mobilization, Splintting, Taping, Traction, Ultrasound, Ionotophoresis 48m/ml Dexamethasone, and Manual therapy   PLAN FOR NEXT SESSION: progress lumbar mobility and core/ postural strength as able.  Patient needs left THA; perhaps try IFS and or moist heat for pain relief?   2:29 PM, 09/17/22 Kyana Aicher Small Ary Rudnick MPT  physical therapy [place] #[phone]

## 2022-09-19 ENCOUNTER — Encounter (HOSPITAL_COMMUNITY): Payer: Medicare HMO

## 2022-09-26 DIAGNOSIS — Z6841 Body Mass Index (BMI) 40.0 and over, adult: Secondary | ICD-10-CM | POA: Diagnosis not present

## 2022-09-26 DIAGNOSIS — J069 Acute upper respiratory infection, unspecified: Secondary | ICD-10-CM | POA: Diagnosis not present

## 2022-09-27 ENCOUNTER — Ambulatory Visit: Payer: Medicare HMO

## 2022-10-01 ENCOUNTER — Ambulatory Visit (HOSPITAL_COMMUNITY): Payer: Medicare HMO | Attending: Family Medicine

## 2022-10-01 ENCOUNTER — Encounter (HOSPITAL_COMMUNITY): Payer: Self-pay

## 2022-10-01 DIAGNOSIS — R262 Difficulty in walking, not elsewhere classified: Secondary | ICD-10-CM | POA: Diagnosis not present

## 2022-10-01 DIAGNOSIS — M545 Low back pain, unspecified: Secondary | ICD-10-CM | POA: Insufficient documentation

## 2022-10-01 DIAGNOSIS — M546 Pain in thoracic spine: Secondary | ICD-10-CM | POA: Insufficient documentation

## 2022-10-01 DIAGNOSIS — M6281 Muscle weakness (generalized): Secondary | ICD-10-CM | POA: Insufficient documentation

## 2022-10-01 NOTE — Therapy (Signed)
OUTPATIENT PHYSICAL THERAPY THORACOLUMBAR PROGRESS NOTE   Patient Name: Shannon [name] MRN: [id number] DOB:30-Apr-1966, 57 y.o., female Today's Date: 10/01/2022  END OF SESSION:  PT End of Session - 10/01/22 1709     Visit Number 4    Number of Visits 8    Date for PT Re-Evaluation 10/18/22    Authorization Type [employer] H[employer]    Authorization Time Period 8 visits approved 12/20-->10/18/22    PT Start Time 5009    PT Stop Time 1512    PT Time Calculation (min) 38 min    Activity Tolerance Patient tolerated treatment well    Behavior During Therapy WFL for tasks assessed/performed                Past Medical History:  Diagnosis Date   Anxiety    Arthritis    oa needs hip replacement on right   Asthma    allergies    Cancer ([place])    squamous cell areas removed from vulva and pre caner areas removed from leg    Carotid artery occlusion    Complication of anesthesia    major depression after general anesthesia   Coronary artery disease    LHC 11/25/12 90% stenosis mid LCx & otherwise nonobstructive dz w/ EF 60-65% S/p PTCA/DES to LCx   Depression    Dyspnea    with exertion    Elevated cholesterol    Exertional dyspnea    chronic   Fibromyalgia    GERD (gastroesophageal reflux disease)    History of cardiovascular stress test 06/2015   low risk   HPV in female    Hyperlipidemia    Hypertension    Liver abscess 09/09/2021   MI (myocardial infarction) ([place]) fe 17, 2014   Obesity    Pneumonia    hx of x 2 years ago    Polycystic ovary disease    Skin tear of upper arm without complication, left, sequela    small skin tear left upper arm no drainage for 1 week   Sleep apnea    mild no cpap    Tobacco abuse    Past Surgical History:  Procedure Laterality Date   CORONARY ANGIOPLASTY WITH STENT PLACEMENT     LEFT HEART CATH AND CORONARY ANGIOGRAPHY N/A 08/09/2018   Procedure: LEFT HEART CATH AND CORONARY ANGIOGRAPHY;  Surgeon: Nelva Bush, MD;   Location: [facility];  Service: Cardiovascular;  Laterality: N/A;   LEFT HEART CATHETERIZATION WITH CORONARY ANGIOGRAM N/A 11/15/2012   Procedure: LEFT HEART CATHETERIZATION WITH CORONARY ANGIOGRAM;  Surgeon: Thayer Headings, MD;  Location: [facility] CATH LAB;  Service: Cardiovascular;  Laterality: N/A;   PERCUTANEOUS CORONARY STENT INTERVENTION (PCI-S)  11/15/2012   Procedure: PERCUTANEOUS CORONARY STENT INTERVENTION (PCI-S);  Surgeon: Thayer Headings, MD;  Location: [facility] CATH LAB;  Service: Cardiovascular;;   skin cancer removed right lower leg Right    TONSILLECTOMY     TOTAL HIP ARTHROPLASTY Right 01/16/2021   Procedure: TOTAL HIP ARTHROPLASTY ANTERIOR APPROACH;  Surgeon: Gaynelle Arabian, MD;  Location: [facility];  Service: Orthopedics;  Laterality: Right;  134mn   uterus ablation     VULVECTOMY N/A 11/08/2019   Procedure: excision of VULVA, vulvoscopy;  Surgeon: GDian Queen MD;  Location: [facility]  Service: Gynecology;  Laterality: N/A;   Patient Active Problem List   Diagnosis Date Noted   Liver abscess 09/09/2021   DVT (deep venous thrombosis) ([facility] 09/03/2021   Bacteremia due to  Klebsiella pneumoniae 08/18/2021   Chest pain 08/18/2021   Atypical chest pain 08/17/2021   Hypotension 08/17/2021   Thrombocytopenia ([facility]) 08/17/2021   Hyperglycemia 08/17/2021   Hyponatremia 08/17/2021   Hypokalemia 08/17/2021   Elevated troponin 08/17/2021   Obesity (BMI 30-39.9) 08/17/2021   Asthma 08/17/2021   Anxiety 08/17/2021   Primary osteoarthritis of right hip 01/16/2021   Chronic diastolic HF (heart failure) ([place]) 08/10/2018   GERD (gastroesophageal reflux disease) 08/09/2018   Unstable angina ([place]) 08/09/2018   Carotid artery disease ([place]) 07/24/2017   AAA (abdominal aortic aneurysm) without rupture ([place]) 01/05/2013   Other malaise and fatigue 01/05/2013   Palpitations 01/05/2013   Tobacco use disorder 12/02/2012   Coronary artery disease    Mixed hyperlipidemia     Essential hypertension    SHORTNESS OF BREATH 09/04/2009   CHEST PAIN 09/04/2009    PCP: Ammie Dalton, PA-C  REFERRING PROVIDER: Ammie Dalton, PA-C  REFERRING DIAG: PT eval/tx for chronic back pain and lower extremity weakness per Ammie Dalton, PA  Rationale for Evaluation and Treatment: Rehabilitation  THERAPY DIAG:  Low back pain, unspecified back pain laterality, unspecified chronicity, unspecified whether sciatica present  Muscle weakness (generalized)  Pain in thoracic spine  Difficulty in walking, not elsewhere classified  ONSET DATE: chronic, long time   SUBJECTIVE:                                                                                                                                                                                           SUBJECTIVE STATEMENT:  Pt stated she continues to have constant soreness LBP, pain scale 6/10.  Reports improved awareness of posture and has been compliant with HEP.  Eval subjective: History of chronic back pain; hx of arthritic; right hip already replaced and left hip needs to be done  PERTINENT HISTORY:  Right THA 12/2020; need left one done  PAIN:  Are you having pain? Yes: NPRS scale: 6/10 Pain location: mid and low back Pain description: soreness Aggravating factors: sitting for a long time, prolonged walking Relieving factors: lying down  PRECAUTIONS: None  WEIGHT BEARING RESTRICTIONS: No  FALLS:  Has patient fallen in last 6 months? Yes. Number of falls 1; needed A to get up  LIVING ENVIRONMENT: Lives with: lives with their spouse Lives in: House/apartment Stairs: Yes: Internal: 10-12 steps; on right going up and External: 1-2 steps; on right going up, on left going up, and can reach both Has following equipment at home: Single point cane and Walker - 2 wheeled  OCCUPATION: not currently working; on disability  PLOF: Independent  PATIENT GOALS: want to be able to  function without so much  pain  NEXT MD VISIT:   OBJECTIVE:   DIAGNOSTIC FINDINGS:  None recent  PATIENT SURVEYS:  FOTO 26  SCOGNITION: Overall cognitive status: Within functional limits for tasks assessed     SENSATION: Numbness right thigh  POSTURE: decreased lumbar lordosis and increased thoracic kyphosis  PALPATION: Patient reports tightness and tenderness mid back to low back paraspinals  LUMBAR ROM:   AROM eval 09/17/22  Flexion 55% *available 65% available* pullin  Extension 25% *availble 25% available* most painful  Right lateral flexion To 3" above knee   Left lateral flexion To 1" above knee   Right rotation    Left rotation     (Blank rows = not tested)   LOWER EXTREMITY MMT:    MMT Right eval Left eval Right 09/17/22 Left 09/17/22  Hip flexion 5 3-* 5 4-  Hip extension 4+ 3- 3- 4+  Hip abduction      Hip adduction      Hip internal rotation      Hip external rotation      Knee flexion   4- 4+  Knee extension 5 4+ 5 4+  Ankle dorsiflexion '5 5 5 5  '$ Ankle plantarflexion      Ankle inversion      Ankle eversion       (Blank rows = not tested)    FUNCTIONAL TESTS:  5 times sit to stand: 29 sec  GAIT: Distance walked: 70 ft plus one flight of steps Assistive device utilized: None Level of assistance: Modified independence Comments: slower than normal gait speed  TODAY'S TREATMENT:                                                                                                                              DATE:  10/01/22 Supine: MHP with IFS 7, increased to 8.5 Volts CV x 10 min Educated use and ability to Special educational needs teacher with ab set 15 x 5" SKTC 3x 20" towel assistance Decompression RTB 1-4 5 reps LTR 5x 10" (pain with rotation to Rt, pulling in Lt hip)  09/17/22 Progress note 5 x sit to stand 24.4 sec FOTO 24 MMT's and AROM  Supine: LTR x 10 (pulling in left hip) Abdominal bracing 5" hold x 10 Decompression exercise head press, shoulder press, leg  press, leg lengthener  5" hold x 10 Bridge x 8  Seated: Scapular retractions x 10 Cervical retraction with extension x 10       09/05/22: Reviewed goals and importance of HEP compliance for maximal benefits Supine: Scapular retraction -HEP Transverse abdominis paired with exhale 10x 5"- HEP Decompression 2-5 5x 5" Prone: Reviewed benefits with prone position- HEP Prone on elbow x 1 min Glut set 10x 5" Heel squeeze 5x 5" Importance of seated posture Seated scapular retraction    08/18/22 Physical therapy evaluation and HEP instruction    Education details: Patient educated on exam findings, POC, scope of  PT, HEP, and good sitting posture education. Person educated: Patient Education method: Explanation, Demonstration, and Handouts Education comprehension: verbalized understanding, returned demonstration, verbal cues required, and tactile cues required   HOME EXERCISE PROGRAM: Access Code: [email or web address] URL: [email or web address] Date: 08/18/2022 Prepared by: [facility] - Rehab  Exercises - Lying Prone  - 2 x daily - 7 x weekly - 1 sets - 1 reps - 2-5 min hold - Prone Gluteal Sets  - 2 x daily - 7 x weekly - 1 sets - 10 reps - 5 sec hold - Supine Scapular Retraction  - 2 x daily - 7 x weekly - 1 sets - 10 reps - Supine Transversus Abdominis Bracing - Hands on Stomach  - 1 x daily - 7 x weekly - 3 sets - 10 reps  Access Code: [email or web address] URL: [email or web address] Date: 09/05/2022 Prepared by: Ihor Austin  Exercises - Lying Prone  - 2 x daily - 7 x weekly - 1 sets - 1 reps - 2-5 min hold - Prone Gluteal Sets  - 2 x daily - 7 x weekly - 1 sets - 10 reps - 5 sec hold - Supine Scapular Retraction  - 2 x daily - 7 x weekly - 1 sets - 10 reps - Supine Transversus Abdominis Bracing - Hands on Stomach  - 1 x daily - 7 x weekly - 3 sets - 10 reps - Prone on Elbows Stretch  - 2 x daily - 7 x weekly - 1 sets - 1 reps - 2-5' hold - Correct Seated Posture   - 1 x daily - 7 x weekly - 3 sets - 10 reps - Seated Scapular Retraction  - 1 x daily - 7 x weekly - 2 sets - 10 reps - 5" hold  ASSESSMENT:  CLINICAL IMPRESSION: Session complete with MHP supine position with additional interferential electrical stimulation for pain control.  Pt educated on purchase and placement of electrodes for possible purchase for self care.  Added stretches for mobility and progressed core and postural strengthening with additional theraband decompression exercises.  Pt reports a pulling and increased pain Rt side during LTR.  Reports of some LBP relief following IFS with MHP.    OBJECTIVE IMPAIRMENTS: Abnormal gait, decreased activity tolerance, decreased endurance, decreased mobility, difficulty walking, decreased ROM, decreased strength, hypomobility, increased fascial restrictions, impaired perceived functional ability, increased muscle spasms, impaired flexibility, and pain.   ACTIVITY LIMITATIONS: carrying, lifting, bending, sitting, standing, squatting, sleeping, stairs, transfers, bed mobility, bathing, toileting, dressing, hygiene/grooming, locomotion level, and caring for others  PARTICIPATION LIMITATIONS: meal prep, cleaning, laundry, driving, shopping, and community activity  REHAB POTENTIAL: Good  CLINICAL DECISION MAKING: Stable/uncomplicated  EVALUATION COMPLEXITY: Low   GOALS: Goals reviewed with patient? No  SHORT TERM GOALS: Target date: 09/01/2022  Patient will be independent with initial HEP  Baseline: Goal status: IN PROGRESS  2.  Patient will demonstrate good sitting posture to be able to sit x 15' to ride in a car for community activities without pain > 3/10 in the back.  Baseline: more aware of sitting posture Goal status: IN PROGRESS   LONG TERM GOALS: Target date: 09/15/2022  Patient will be independent in self management strategies to improve quality of life and functional outcomes.  Baseline:  Goal status: IN PROGRESS  2.   Patient will improve FOTO score to predicted value Baseline: 24 Goal status: IN PROGRESS  3.   Patient will report at least 50% improvement in overall symptoms and/or function to demonstrate improved  functional mobility   Baseline:  Goal status: IN PROGRESS  4.  Patient will improve 5 times sit to stand score from 29 sec to 20 sec to demonstrate improved functional mobility and increased lower extremity strength.  Baseline: 09/17/22 24.4 sec Goal status: IN PROGRESS  5.  Patient will increase lower extremity MMTs to 4+- 5/5 without pain to promote return to ambulation community distances with minimal deviation.  Baseline: see above Goal status: IN PROGRESS  PLAN:  PT FREQUENCY: 2x/week  PT DURATION: 4 weeks  Therapeutic exercises, Therapeutic activity, Neuromuscular re-education, Balance training, Gait training, Patient/Family education, Joint manipulation, Joint mobilization, Stair training, Orthotic/Fit training, DME instructions, Aquatic Therapy, Dry Needling, Electrical stimulation, Spinal manipulation, Spinal mobilization, Cryotherapy, Moist heat, Compression bandaging, scar mobilization, Splintting, Taping, Traction, Ultrasound, Ionotophoresis '4mg'$ /ml Dexamethasone, and Manual therapy   PLAN FOR NEXT SESSION: progress lumbar mobility and core/ postural strength as able.  Patient needs left THA; perhaps try IFS and or moist heat for pain relief?  Trial with manual to address restrictions.  Add hamstring stretch.  Ihor Austin, LPTA/CLT; CBIS [phone]  5:14 PM, 10/01/22

## 2022-10-03 ENCOUNTER — Ambulatory Visit (HOSPITAL_COMMUNITY): Payer: Medicare HMO

## 2022-10-03 ENCOUNTER — Encounter (HOSPITAL_COMMUNITY): Payer: Self-pay

## 2022-10-03 DIAGNOSIS — M546 Pain in thoracic spine: Secondary | ICD-10-CM

## 2022-10-03 DIAGNOSIS — R262 Difficulty in walking, not elsewhere classified: Secondary | ICD-10-CM

## 2022-10-03 DIAGNOSIS — M6281 Muscle weakness (generalized): Secondary | ICD-10-CM | POA: Diagnosis not present

## 2022-10-03 DIAGNOSIS — M545 Low back pain, unspecified: Secondary | ICD-10-CM | POA: Diagnosis not present

## 2022-10-03 NOTE — Therapy (Signed)
OUTPATIENT PHYSICAL THERAPY THORACOLUMBAR PROGRESS NOTE   Patient Name: Shannon [name] MRN: [id number] DOB:1965-12-07, 57 y.o., female Today's Date: 10/03/2022  END OF SESSION:  PT End of Session - 10/03/22 1609     Visit Number 5    Number of Visits 8    Date for PT Re-Evaluation 10/18/22    Authorization Type [employer] H[employer]    Authorization Time Period 8 visits approved 12/20-->10/18/22    PT Start Time 1522    PT Stop Time 1602    PT Time Calculation (min) 40 min    Activity Tolerance Patient tolerated treatment well    Behavior During Therapy [facility] for tasks assessed/performed                 Past Medical History:  Diagnosis Date   Anxiety    Arthritis    oa needs hip replacement on right   Asthma    allergies    Cancer ([place])    squamous cell areas removed from vulva and pre caner areas removed from leg    Carotid artery occlusion    Complication of anesthesia    major depression after general anesthesia   Coronary artery disease    LHC 11/25/12 90% stenosis mid LCx & otherwise nonobstructive dz w/ EF 60-65% S/p PTCA/DES to LCx   Depression    Dyspnea    with exertion    Elevated cholesterol    Exertional dyspnea    chronic   Fibromyalgia    GERD (gastroesophageal reflux disease)    History of cardiovascular stress test 06/2015   low risk   HPV in female    Hyperlipidemia    Hypertension    Liver abscess 09/09/2021   MI (myocardial infarction) ([place]) fe 17, 2014   Obesity    Pneumonia    hx of x 2 years ago    Polycystic ovary disease    Skin tear of upper arm without complication, left, sequela    small skin tear left upper arm no drainage for 1 week   Sleep apnea    mild no cpap    Tobacco abuse    Past Surgical History:  Procedure Laterality Date   CORONARY ANGIOPLASTY WITH STENT PLACEMENT     LEFT HEART CATH AND CORONARY ANGIOGRAPHY N/A 08/09/2018   Procedure: LEFT HEART CATH AND CORONARY ANGIOGRAPHY;  Surgeon: Nelva Bush, MD;   Location: [place] [facility];  Service: Cardiovascular;  Laterality: N/A;   LEFT HEART CATHETERIZATION WITH CORONARY ANGIOGRAM N/A 11/15/2012   Procedure: LEFT HEART CATHETERIZATION WITH CORONARY ANGIOGRAM;  Surgeon: Thayer Headings, MD;  Location: [facility] CATH LAB;  Service: Cardiovascular;  Laterality: N/A;   PERCUTANEOUS CORONARY STENT INTERVENTION (PCI-S)  11/15/2012   Procedure: PERCUTANEOUS CORONARY STENT INTERVENTION (PCI-S);  Surgeon: Thayer Headings, MD;  Location: [facility] CATH LAB;  Service: Cardiovascular;;   skin cancer removed right lower leg Right    TONSILLECTOMY     TOTAL HIP ARTHROPLASTY Right 01/16/2021   Procedure: TOTAL HIP ARTHROPLASTY ANTERIOR APPROACH;  Surgeon: Gaynelle Arabian, MD;  Location: [facility];  Service: Orthopedics;  Laterality: Right;  158mn   uterus ablation     VULVECTOMY N/A 11/08/2019   Procedure: excision of VULVA, vulvoscopy;  Surgeon: GDian Queen MD;  Location: [facility]  Service: Gynecology;  Laterality: N/A;   Patient Active Problem List   Diagnosis Date Noted   Liver abscess 09/09/2021   DVT (deep venous thrombosis) ([place] 09/03/2021   Bacteremia due  to Klebsiella pneumoniae 08/18/2021   Chest pain 08/18/2021   Atypical chest pain 08/17/2021   Hypotension 08/17/2021   Thrombocytopenia ([facility]) 08/17/2021   Hyperglycemia 08/17/2021   Hyponatremia 08/17/2021   Hypokalemia 08/17/2021   Elevated troponin 08/17/2021   Obesity (BMI 30-39.9) 08/17/2021   Asthma 08/17/2021   Anxiety 08/17/2021   Primary osteoarthritis of right hip 01/16/2021   Chronic diastolic HF (heart failure) ([place]) 08/10/2018   GERD (gastroesophageal reflux disease) 08/09/2018   Unstable angina ([place]) 08/09/2018   Carotid artery disease ([place]) 07/24/2017   AAA (abdominal aortic aneurysm) without rupture ([place]) 01/05/2013   Other malaise and fatigue 01/05/2013   Palpitations 01/05/2013   Tobacco use disorder 12/02/2012   Coronary artery disease    Mixed hyperlipidemia     Essential hypertension    SHORTNESS OF BREATH 09/04/2009   CHEST PAIN 09/04/2009    PCP: Ammie Dalton, PA-C  REFERRING PROVIDER: Ammie Dalton, PA-C  REFERRING DIAG: PT eval/tx for chronic back pain and lower extremity weakness per Ammie Dalton, PA  Rationale for Evaluation and Treatment: Rehabilitation  THERAPY DIAG:  Low back pain, unspecified back pain laterality, unspecified chronicity, unspecified whether sciatica present  Muscle weakness (generalized)  Pain in thoracic spine  Difficulty in walking, not elsewhere classified  ONSET DATE: chronic, long time   SUBJECTIVE:                                                                                                                                                                                           SUBJECTIVE STATEMENT:   Pt stated she was a little sore following last session but reports improvements the next day following the stretches added last session.  Current pain scale 4/10, stated she had rough night (not pain related) and hasn't completed as much today so pain scale 4/10, has completed the decompression exercises as finds them helpful daily.  Eval subjective: History of chronic back pain; hx of arthritic; right hip already replaced and left hip needs to be done  PERTINENT HISTORY:  Right THA 12/2020; need left one done  PAIN:  Are you having pain? Yes: NPRS scale: 4/10 Pain location: mid and low back Pain description: soreness Aggravating factors: sitting for a long time, prolonged walking Relieving factors: lying down  PRECAUTIONS: None  WEIGHT BEARING RESTRICTIONS: No  FALLS:  Has patient fallen in last 6 months? Yes. Number of falls 1; needed A to get up  LIVING ENVIRONMENT: Lives with: lives with their spouse Lives in: House/apartment Stairs: Yes: Internal: 10-12 steps; on right going up and External: 1-2 steps; on right going up, on left going up, and can  reach both Has following  equipment at home: Single point cane and Walker - 2 wheeled  OCCUPATION: not currently working; on disability  PLOF: Independent  PATIENT GOALS: want to be able to function without so much pain  NEXT MD VISIT:   OBJECTIVE:   DIAGNOSTIC FINDINGS:  None recent  PATIENT SURVEYS:  FOTO 26  SCOGNITION: Overall cognitive status: Within functional limits for tasks assessed     SENSATION: Numbness right thigh  POSTURE: decreased lumbar lordosis and increased thoracic kyphosis  PALPATION: Patient reports tightness and tenderness mid back to low back paraspinals  LUMBAR ROM:   AROM eval 09/17/22  Flexion 55% *available 65% available* pullin  Extension 25% *availble 25% available* most painful  Right lateral flexion To 3" above knee   Left lateral flexion To 1" above knee   Right rotation    Left rotation     (Blank rows = not tested)   LOWER EXTREMITY MMT:    MMT Right eval Left eval Right 09/17/22 Left 09/17/22  Hip flexion 5 3-* 5 4-  Hip extension 4+ 3- 3- 4+  Hip abduction      Hip adduction      Hip internal rotation      Hip external rotation      Knee flexion   4- 4+  Knee extension 5 4+ 5 4+  Ankle dorsiflexion '5 5 5 5  '$ Ankle plantarflexion      Ankle inversion      Ankle eversion       (Blank rows = not tested)    FUNCTIONAL TESTS:  5 times sit to stand: 29 sec  GAIT: Distance walked: 70 ft plus one flight of steps Assistive device utilized: None Level of assistance: Modified independence Comments: slower than normal gait speed  TODAY'S TREATMENT:                                                                                                                              DATE:  10/03/22 Supine: MHP during exercise Bridge 10x Marching 15x 5" with ab set Hamstring stretch 3x 30" initial with towel then with rope LTR 5x 10" no reports of pain either direction, improved mobility.  POE x 2 min Manual STM in prone with focus on  thoracic/lumbar paraspinals and QL.  10/01/22 Supine: MHP with IFS 7, increased to 8.5 Volts CV x 10 min Educated use and ability to Special educational needs teacher with ab set 15 x 5" SKTC 3x 20" towel assistance Decompression RTB 1-4 5 reps LTR 5x 10" (pain with rotation to Rt, pulling in Lt hip)  09/17/22 Progress note 5 x sit to stand 24.4 sec FOTO 24 MMT's and AROM  Supine: LTR x 10 (pulling in left hip) Abdominal bracing 5" hold x 10 Decompression exercise head press, shoulder press, leg press, leg lengthener  5" hold x 10 Bridge x 8  Seated: Scapular retractions x 10 Cervical retraction with extension x 10  09/05/22: Reviewed goals and importance of HEP compliance for maximal benefits Supine: Scapular retraction -HEP Transverse abdominis paired with exhale 10x 5"- HEP Decompression 2-5 5x 5" Prone: Reviewed benefits with prone position- HEP Prone on elbow x 1 min Glut set 10x 5" Heel squeeze 5x 5" Importance of seated posture Seated scapular retraction    08/18/22 Physical therapy evaluation and HEP instruction    Education details: Patient educated on exam findings, POC, scope of PT, HEP, and good sitting posture education. Person educated: Patient Education method: Explanation, Demonstration, and Handouts Education comprehension: verbalized understanding, returned demonstration, verbal cues required, and tactile cues required   HOME EXERCISE PROGRAM: Access Code: [email or web address] URL: [email or web address] Date: 08/18/2022 Prepared by: [facility] - Rehab  Exercises - Lying Prone  - 2 x daily - 7 x weekly - 1 sets - 1 reps - 2-5 min hold - Prone Gluteal Sets  - 2 x daily - 7 x weekly - 1 sets - 10 reps - 5 sec hold - Supine Scapular Retraction  - 2 x daily - 7 x weekly - 1 sets - 10 reps - Supine Transversus Abdominis Bracing - Hands on Stomach  - 1 x daily - 7 x weekly - 3 sets - 10 reps  Access Code: [email or web address] URL:  [email or web address] Date: 09/05/2022 Prepared by: Ihor Austin  Exercises - Lying Prone  - 2 x daily - 7 x weekly - 1 sets - 1 reps - 2-5 min hold - Prone Gluteal Sets  - 2 x daily - 7 x weekly - 1 sets - 10 reps - 5 sec hold - Supine Scapular Retraction  - 2 x daily - 7 x weekly - 1 sets - 10 reps - Supine Transversus Abdominis Bracing - Hands on Stomach  - 1 x daily - 7 x weekly - 3 sets - 10 reps - Prone on Elbows Stretch  - 2 x daily - 7 x weekly - 1 sets - 1 reps - 2-5' hold - Correct Seated Posture  - 1 x daily - 7 x weekly - 3 sets - 10 reps - Seated Scapular Retraction  - 1 x daily - 7 x weekly - 2 sets - 10 reps - 5" hold  10/03/22: - Supine March  - 1 x daily - 7 x weekly - 2 sets - 10 reps - 5" hold - Supine Bridge  - 1 x daily - 7 x weekly - 3 sets - 10 reps - 5" hold - Supine Lower Trunk Rotation  - 1 x daily - 7 x weekly - 3 sets - 5 reps - 10" hold - Hooklying Single Knee to Chest Stretch  - 1 x daily - 7 x weekly - 3 sets - 3 reps - 30" hold - Hooklying Hamstring Stretch with Strap  - 1 x daily - 7 x weekly - 3 sets - 10 reps  ASSESSMENT:  CLINICAL IMPRESSION: Pt progressing well toward POC, presents with improved posture during gait and improved mobility in pain free range noted with LTR today.  Continued session focus with core and proximal strengthening and mobility as reports pain reduced following additional stretches last session.  EOS with manual to address facial restrictions especially in lower thoracic and paraspinal area.  Encouraged pt to stay hydrated following manual to reduce risk of headache following STM.    OBJECTIVE IMPAIRMENTS: Abnormal gait, decreased activity tolerance, decreased endurance, decreased mobility, difficulty walking, decreased ROM, decreased strength, hypomobility, increased fascial restrictions, impaired perceived functional ability, increased  muscle spasms, impaired flexibility, and pain.   ACTIVITY LIMITATIONS:  carrying, lifting, bending, sitting, standing, squatting, sleeping, stairs, transfers, bed mobility, bathing, toileting, dressing, hygiene/grooming, locomotion level, and caring for others  PARTICIPATION LIMITATIONS: meal prep, cleaning, laundry, driving, shopping, and community activity  REHAB POTENTIAL: Good  CLINICAL DECISION MAKING: Stable/uncomplicated  EVALUATION COMPLEXITY: Low   GOALS: Goals reviewed with patient? No  SHORT TERM GOALS: Target date: 09/01/2022  Patient will be independent with initial HEP  Baseline: Goal status: IN PROGRESS  2.  Patient will demonstrate good sitting posture to be able to sit x 15' to ride in a car for community activities without pain > 3/10 in the back.  Baseline: more aware of sitting posture Goal status: IN PROGRESS   LONG TERM GOALS: Target date: 09/15/2022  Patient will be independent in self management strategies to improve quality of life and functional outcomes.  Baseline:  Goal status: IN PROGRESS  2.  Patient will improve FOTO score to predicted value Baseline: 24 Goal status: IN PROGRESS  3.   Patient will report at least 50% improvement in overall symptoms and/or function to demonstrate improved functional mobility   Baseline:  Goal status: IN PROGRESS  4.  Patient will improve 5 times sit to stand score from 29 sec to 20 sec to demonstrate improved functional mobility and increased lower extremity strength.  Baseline: 09/17/22 24.4 sec Goal status: IN PROGRESS  5.  Patient will increase lower extremity MMTs to 4+- 5/5 without pain to promote return to ambulation community distances with minimal deviation.  Baseline: see above Goal status: IN PROGRESS  PLAN:  PT FREQUENCY: 2x/week  PT DURATION: 4 weeks  Therapeutic exercises, Therapeutic activity, Neuromuscular re-education, Balance training, Gait training, Patient/Family education, Joint manipulation, Joint mobilization, Stair training, Orthotic/Fit  training, DME instructions, Aquatic Therapy, Dry Needling, Electrical stimulation, Spinal manipulation, Spinal mobilization, Cryotherapy, Moist heat, Compression bandaging, scar mobilization, Splintting, Taping, Traction, Ultrasound, Ionotophoresis '4mg'$ /ml Dexamethasone, and Manual therapy   PLAN FOR NEXT SESSION: progress lumbar mobility and core/ postural strength as able.  Patient needs left THA; perhaps try IFS and or moist heat for pain relief?  F/U with manual relief next session.  Trial prone press ups, hip extension and STS next session.  Progress as able.    Ihor Austin, LPTA/CLT; CBIS [phone]  5:14 PM, 10/03/22

## 2022-10-08 ENCOUNTER — Encounter (HOSPITAL_COMMUNITY): Payer: Self-pay

## 2022-10-08 ENCOUNTER — Ambulatory Visit (HOSPITAL_COMMUNITY): Payer: Medicare HMO

## 2022-10-08 DIAGNOSIS — M545 Low back pain, unspecified: Secondary | ICD-10-CM | POA: Diagnosis not present

## 2022-10-08 DIAGNOSIS — R262 Difficulty in walking, not elsewhere classified: Secondary | ICD-10-CM | POA: Diagnosis not present

## 2022-10-08 DIAGNOSIS — M6281 Muscle weakness (generalized): Secondary | ICD-10-CM

## 2022-10-08 DIAGNOSIS — M546 Pain in thoracic spine: Secondary | ICD-10-CM

## 2022-10-08 NOTE — Therapy (Signed)
OUTPATIENT PHYSICAL THERAPY THORACOLUMBAR TREATMENT   Patient Name: Shannon [name] MRN: [id number] DOB:02/19/1966, 57 y.o., female Today's Date: 10/08/2022  END OF SESSION:  PT End of Session - 10/08/22 1403     Visit Number 6    Number of Visits 8    Date for PT Re-Evaluation 10/18/22    Authorization Type [employer] H[employer]    Authorization Time Period 8 visits approved 12/20-->10/18/22    PT Start Time 1350    PT Stop Time 1428    PT Time Calculation (min) 38 min    Activity Tolerance Patient tolerated treatment well    Behavior During Therapy WFL for tasks assessed/performed                  Past Medical History:  Diagnosis Date   Anxiety    Arthritis    oa needs hip replacement on right   Asthma    allergies    Cancer ([place])    squamous cell areas removed from vulva and pre caner areas removed from leg    Carotid artery occlusion    Complication of anesthesia    major depression after general anesthesia   Coronary artery disease    LHC 11/25/12 90% stenosis mid LCx & otherwise nonobstructive dz w/ EF 60-65% S/p PTCA/DES to LCx   Depression    Dyspnea    with exertion    Elevated cholesterol    Exertional dyspnea    chronic   Fibromyalgia    GERD (gastroesophageal reflux disease)    History of cardiovascular stress test 06/2015   low risk   HPV in female    Hyperlipidemia    Hypertension    Liver abscess 09/09/2021   MI (myocardial infarction) ([place]) fe 17, 2014   Obesity    Pneumonia    hx of x 2 years ago    Polycystic ovary disease    Skin tear of upper arm without complication, left, sequela    small skin tear left upper arm no drainage for 1 week   Sleep apnea    mild no cpap    Tobacco abuse    Past Surgical History:  Procedure Laterality Date   CORONARY ANGIOPLASTY WITH STENT PLACEMENT     LEFT HEART CATH AND CORONARY ANGIOGRAPHY N/A 08/09/2018   Procedure: LEFT HEART CATH AND CORONARY ANGIOGRAPHY;  Surgeon: Nelva Bush, MD;   Location: [facility];  Service: Cardiovascular;  Laterality: N/A;   LEFT HEART CATHETERIZATION WITH CORONARY ANGIOGRAM N/A 11/15/2012   Procedure: LEFT HEART CATHETERIZATION WITH CORONARY ANGIOGRAM;  Surgeon: Thayer Headings, MD;  Location: [facility] CATH LAB;  Service: Cardiovascular;  Laterality: N/A;   PERCUTANEOUS CORONARY STENT INTERVENTION (PCI-S)  11/15/2012   Procedure: PERCUTANEOUS CORONARY STENT INTERVENTION (PCI-S);  Surgeon: Thayer Headings, MD;  Location: [facility] CATH LAB;  Service: Cardiovascular;;   skin cancer removed right lower leg Right    TONSILLECTOMY     TOTAL HIP ARTHROPLASTY Right 01/16/2021   Procedure: TOTAL HIP ARTHROPLASTY ANTERIOR APPROACH;  Surgeon: Gaynelle Arabian, MD;  Location: [facility];  Service: Orthopedics;  Laterality: Right;  146mn   uterus ablation     VULVECTOMY N/A 11/08/2019   Procedure: excision of VULVA, vulvoscopy;  Surgeon: GDian Queen MD;  Location: [facility]  Service: Gynecology;  Laterality: N/A;   Patient Active Problem List   Diagnosis Date Noted   Liver abscess 09/09/2021   DVT (deep venous thrombosis) ([place] 09/03/2021   Bacteremia due  to Klebsiella pneumoniae 08/18/2021   Chest pain 08/18/2021   Atypical chest pain 08/17/2021   Hypotension 08/17/2021   Thrombocytopenia ([facility]) 08/17/2021   Hyperglycemia 08/17/2021   Hyponatremia 08/17/2021   Hypokalemia 08/17/2021   Elevated troponin 08/17/2021   Obesity (BMI 30-39.9) 08/17/2021   Asthma 08/17/2021   Anxiety 08/17/2021   Primary osteoarthritis of right hip 01/16/2021   Chronic diastolic HF (heart failure) ([place]) 08/10/2018   GERD (gastroesophageal reflux disease) 08/09/2018   Unstable angina ([place]) 08/09/2018   Carotid artery disease ([place]) 07/24/2017   AAA (abdominal aortic aneurysm) without rupture ([place]) 01/05/2013   Other malaise and fatigue 01/05/2013   Palpitations 01/05/2013   Tobacco use disorder 12/02/2012   Coronary artery disease    Mixed hyperlipidemia     Essential hypertension    SHORTNESS OF BREATH 09/04/2009   CHEST PAIN 09/04/2009    PCP: Ammie Dalton, PA-C  REFERRING PROVIDER: Ammie Dalton, PA-C  REFERRING DIAG: PT eval/tx for chronic back pain and lower extremity weakness per Ammie Dalton, PA  Rationale for Evaluation and Treatment: Rehabilitation  THERAPY DIAG:  Low back pain, unspecified back pain laterality, unspecified chronicity, unspecified whether sciatica present  Muscle weakness (generalized)  Pain in thoracic spine  Difficulty in walking, not elsewhere classified  ONSET DATE: chronic, long time   SUBJECTIVE:                                                                                                                                                                                           SUBJECTIVE STATEMENT:  Pt stated she has some soreness today, pain scale 4/10.  Positive results following massage last session.  Reports has been a couple days since needing medication.  Reports the decompression exercises are helpful at home.  Eval subjective: History of chronic back pain; hx of arthritic; right hip already replaced and left hip needs to be done  PERTINENT HISTORY:  Right THA 12/2020; need left one done  PAIN:  Are you having pain? Yes: NPRS scale: 4/10 Pain location: mid and low back Pain description: soreness Aggravating factors: sitting for a long time, prolonged walking Relieving factors: lying down  PRECAUTIONS: None  WEIGHT BEARING RESTRICTIONS: No  FALLS:  Has patient fallen in last 6 months? Yes. Number of falls 1; needed A to get up  LIVING ENVIRONMENT: Lives with: lives with their spouse Lives in: House/apartment Stairs: Yes: Internal: 10-12 steps; on right going up and External: 1-2 steps; on right going up, on left going up, and can reach both Has following equipment at home: Single point cane and Walker - 2 wheeled  OCCUPATION: not currently working;  on disability  PLOF:  Independent  PATIENT GOALS: want to be able to function without so much pain  NEXT MD VISIT:   OBJECTIVE:   DIAGNOSTIC FINDINGS:  None recent  PATIENT SURVEYS:  FOTO 26  SCOGNITION: Overall cognitive status: Within functional limits for tasks assessed     SENSATION: Numbness right thigh  POSTURE: decreased lumbar lordosis and increased thoracic kyphosis  PALPATION: Patient reports tightness and tenderness mid back to low back paraspinals  LUMBAR ROM:   AROM eval 09/17/22  Flexion 55% *available 65% available* pullin  Extension 25% *availble 25% available* most painful  Right lateral flexion To 3" above knee   Left lateral flexion To 1" above knee   Right rotation    Left rotation     (Blank rows = not tested)   LOWER EXTREMITY MMT:    MMT Right eval Left eval Right 09/17/22 Left 09/17/22  Hip flexion 5 3-* 5 4-  Hip extension 4+ 3- 3- 4+  Hip abduction      Hip adduction      Hip internal rotation      Hip external rotation      Knee flexion   4- 4+  Knee extension 5 4+ 5 4+  Ankle dorsiflexion '5 5 5 5  '$ Ankle plantarflexion      Ankle inversion      Ankle eversion       (Blank rows = not tested)    FUNCTIONAL TESTS:  5 times sit to stand: 29 sec  GAIT: Distance walked: 70 ft plus one flight of steps Assistive device utilized: None Level of assistance: Modified independence Comments: slower than normal gait speed  TODAY'S TREATMENT:                                                                                                                              DATE:  10/08/22: STS 10x  Standing lumbar extension 10x  Prone: POE Prone press-up 5x 10" 2 sets  Prone hip extension 10 BLE  Manual STM in prone with focus on thoracic/lumbar paraspinals and QL.  3D hip excursion 10x  Seated row GTB Standing shoulder extension GTB 10x   10/03/22 Supine: MHP during exercise Bridge 10x Marching 15x 5" with ab set Hamstring stretch 3x 30"  initial with towel then with rope LTR 5x 10" no reports of pain either direction, improved mobility.  POE x 2 min Manual STM in prone with focus on thoracic/lumbar paraspinals and QL.  10/01/22 Supine: MHP with IFS 7, increased to 8.5 Volts CV x 10 min Educated use and ability to Special educational needs teacher with ab set 15 x 5" SKTC 3x 20" towel assistance Decompression RTB 1-4 5 reps LTR 5x 10" (pain with rotation to Rt, pulling in Lt hip)  09/17/22 Progress note 5 x sit to stand 24.4 sec FOTO 24 MMT's and AROM  Supine: LTR x 10 (pulling in left hip) Abdominal bracing 5" hold x  10 Decompression exercise head press, shoulder press, leg press, leg lengthener  5" hold x 10 Bridge x 8  Seated: Scapular retractions x 10 Cervical retraction with extension x 10       09/05/22: Reviewed goals and importance of HEP compliance for maximal benefits Supine: Scapular retraction -HEP Transverse abdominis paired with exhale 10x 5"- HEP Decompression 2-5 5x 5" Prone: Reviewed benefits with prone position- HEP Prone on elbow x 1 min Glut set 10x 5" Heel squeeze 5x 5" Importance of seated posture Seated scapular retraction    08/18/22 Physical therapy evaluation and HEP instruction    Education details: Patient educated on exam findings, POC, scope of PT, HEP, and good sitting posture education. Person educated: Patient Education method: Explanation, Demonstration, and Handouts Education comprehension: verbalized understanding, returned demonstration, verbal cues required, and tactile cues required   HOME EXERCISE PROGRAM: Access Code: [email or web address] URL: [email or web address] Date: 08/18/2022 Prepared by: AP - Rehab  Exercises - Lying Prone  - 2 x daily - 7 x weekly - 1 sets - 1 reps - 2-5 min hold - Prone Gluteal Sets  - 2 x daily - 7 x weekly - 1 sets - 10 reps - 5 sec hold - Supine Scapular Retraction  - 2 x daily - 7 x weekly - 1 sets - 10 reps - Supine  Transversus Abdominis Bracing - Hands on Stomach  - 1 x daily - 7 x weekly - 3 sets - 10 reps  Access Code: [email or web address] URL: [email or web address] Date: 09/05/2022 Prepared by: Ihor Austin  Exercises - Lying Prone  - 2 x daily - 7 x weekly - 1 sets - 1 reps - 2-5 min hold - Prone Gluteal Sets  - 2 x daily - 7 x weekly - 1 sets - 10 reps - 5 sec hold - Supine Scapular Retraction  - 2 x daily - 7 x weekly - 1 sets - 10 reps - Supine Transversus Abdominis Bracing - Hands on Stomach  - 1 x daily - 7 x weekly - 3 sets - 10 reps - Prone on Elbows Stretch  - 2 x daily - 7 x weekly - 1 sets - 1 reps - 2-5' hold - Correct Seated Posture  - 1 x daily - 7 x weekly - 3 sets - 10 reps - Seated Scapular Retraction  - 1 x daily - 7 x weekly - 2 sets - 10 reps - 5" hold  10/03/22: - Supine March  - 1 x daily - 7 x weekly - 2 sets - 10 reps - 5" hold - Supine Bridge  - 1 x daily - 7 x weekly - 3 sets - 10 reps - 5" hold - Supine Lower Trunk Rotation  - 1 x daily - 7 x weekly - 3 sets - 5 reps - 10" hold - Hooklying Single Knee to Chest Stretch  - 1 x daily - 7 x weekly - 3 sets - 3 reps - 30" hold - Hooklying Hamstring Stretch with Strap  - 1 x daily - 7 x weekly - 3 sets - 10 reps  ASSESSMENT:  CLINICAL IMPRESSION: Session focus with mobility and strengthening.  Added prone press up and standing extension for mobility and progressed gluteal strengthening with additional STS and prone hip extension.  Pt tolerated well to new exercises with minimal c/o pain with weight bearing exercises and Lt hip.  Did included STM to address mobility in mid and lower paraspinal and QL with reports of relief following.  OBJECTIVE IMPAIRMENTS: Abnormal gait, decreased activity tolerance, decreased endurance, decreased mobility, difficulty walking, decreased ROM, decreased strength, hypomobility, increased fascial restrictions, impaired perceived functional ability, increased muscle spasms, impaired  flexibility, and pain.   ACTIVITY LIMITATIONS: carrying, lifting, bending, sitting, standing, squatting, sleeping, stairs, transfers, bed mobility, bathing, toileting, dressing, hygiene/grooming, locomotion level, and caring for others  PARTICIPATION LIMITATIONS: meal prep, cleaning, laundry, driving, shopping, and community activity  REHAB POTENTIAL: Good  CLINICAL DECISION MAKING: Stable/uncomplicated  EVALUATION COMPLEXITY: Low   GOALS: Goals reviewed with patient? No  SHORT TERM GOALS: Target date: 09/01/2022  Patient will be independent with initial HEP  Baseline: Goal status: IN PROGRESS  2.  Patient will demonstrate good sitting posture to be able to sit x 15' to ride in a car for community activities without pain > 3/10 in the back.  Baseline: more aware of sitting posture Goal status: IN PROGRESS   LONG TERM GOALS: Target date: 09/15/2022  Patient will be independent in self management strategies to improve quality of life and functional outcomes.  Baseline:  Goal status: IN PROGRESS  2.  Patient will improve FOTO score to predicted value Baseline: 24 Goal status: IN PROGRESS  3.   Patient will report at least 50% improvement in overall symptoms and/or function to demonstrate improved functional mobility   Baseline:  Goal status: IN PROGRESS  4.  Patient will improve 5 times sit to stand score from 29 sec to 20 sec to demonstrate improved functional mobility and increased lower extremity strength.  Baseline: 09/17/22 24.4 sec Goal status: IN PROGRESS  5.  Patient will increase lower extremity MMTs to 4+- 5/5 without pain to promote return to ambulation community distances with minimal deviation.  Baseline: see above Goal status: IN PROGRESS  PLAN:  PT FREQUENCY: 2x/week  PT DURATION: 4 weeks  Therapeutic exercises, Therapeutic activity, Neuromuscular re-education, Balance training, Gait training, Patient/Family education, Joint manipulation, Joint  mobilization, Stair training, Orthotic/Fit training, DME instructions, Aquatic Therapy, Dry Needling, Electrical stimulation, Spinal manipulation, Spinal mobilization, Cryotherapy, Moist heat, Compression bandaging, scar mobilization, Splintting, Taping, Traction, Ultrasound, Ionotophoresis '4mg'$ /ml Dexamethasone, and Manual therapy   PLAN FOR NEXT SESSION: progress lumbar mobility and core/ postural strength as able.  Patient needs left THA; perhaps try IFS and or moist heat for pain relief?  F/U with manual relief next session.  Give theraband for postural strengthening and progress to standing exercises next session.    Ihor Austin, LPTA/CLT; CBIS [phone]  3:55 PM, 10/08/22

## 2022-10-10 ENCOUNTER — Encounter (HOSPITAL_COMMUNITY): Payer: Medicare HMO

## 2022-10-15 ENCOUNTER — Ambulatory Visit (HOSPITAL_COMMUNITY): Payer: Medicare HMO

## 2022-10-15 ENCOUNTER — Encounter (HOSPITAL_COMMUNITY): Payer: Medicare HMO | Admitting: Physical Therapy

## 2022-10-15 DIAGNOSIS — M6281 Muscle weakness (generalized): Secondary | ICD-10-CM

## 2022-10-15 DIAGNOSIS — M546 Pain in thoracic spine: Secondary | ICD-10-CM

## 2022-10-15 DIAGNOSIS — M545 Low back pain, unspecified: Secondary | ICD-10-CM

## 2022-10-15 DIAGNOSIS — R262 Difficulty in walking, not elsewhere classified: Secondary | ICD-10-CM | POA: Diagnosis not present

## 2022-10-15 NOTE — Therapy (Signed)
OUTPATIENT PHYSICAL THERAPY THORACOLUMBAR TREATMENT   Patient Name: Shannon [name] MRN: [id number] DOB:September 17, 1966, 57 y.o., female Today's Date: 10/15/2022  END OF SESSION:  PT End of Session - 10/15/22 1302     Visit Number 7    Number of Visits 8    Date for PT Re-Evaluation 10/18/22    Authorization Type [employer] H[employer]    Authorization Time Period 8 visits approved 12/20-->10/18/22; submitted for more visits 10/15/22 please check    PT Start Time 1302    PT Stop Time 1342    PT Time Calculation (min) 40 min    Activity Tolerance Patient tolerated treatment well    Behavior During Therapy [facility] for tasks assessed/performed                  Past Medical History:  Diagnosis Date   Anxiety    Arthritis    oa needs hip replacement on right   Asthma    allergies    Cancer ([place])    squamous cell areas removed from vulva and pre caner areas removed from leg    Carotid artery occlusion    Complication of anesthesia    major depression after general anesthesia   Coronary artery disease    LHC 11/25/12 90% stenosis mid LCx & otherwise nonobstructive dz w/ EF 60-65% S/p PTCA/DES to LCx   Depression    Dyspnea    with exertion    Elevated cholesterol    Exertional dyspnea    chronic   Fibromyalgia    GERD (gastroesophageal reflux disease)    History of cardiovascular stress test 06/2015   low risk   HPV in female    Hyperlipidemia    Hypertension    Liver abscess 09/09/2021   MI (myocardial infarction) ([place]) fe 17, 2014   Obesity    Pneumonia    hx of x 2 years ago    Polycystic ovary disease    Skin tear of upper arm without complication, left, sequela    small skin tear left upper arm no drainage for 1 week   Sleep apnea    mild no cpap    Tobacco abuse    Past Surgical History:  Procedure Laterality Date   CORONARY ANGIOPLASTY WITH STENT PLACEMENT     LEFT HEART CATH AND CORONARY ANGIOGRAPHY N/A 08/09/2018   Procedure: LEFT HEART CATH AND CORONARY  ANGIOGRAPHY;  Surgeon: Nelva Bush, MD;  Location: [place] [facility];  Service: Cardiovascular;  Laterality: N/A;   LEFT HEART CATHETERIZATION WITH CORONARY ANGIOGRAM N/A 11/15/2012   Procedure: LEFT HEART CATHETERIZATION WITH CORONARY ANGIOGRAM;  Surgeon: Thayer Headings, MD;  Location[facility] C[facility] LAB;  Service: Cardiovascular;  Laterality: N/A;   PERCUTANEOUS CORONARY STENT INTERVENTION (PCI-S)  11/15/2012   Procedure: PERCUTANEOUS CORONARY STENT INTERVENTION (PCI-S);  Surgeon: Thayer Headings, MD;  Location: [facility] CATH LAB;  Service: Cardiovascular;;   skin cancer removed right lower leg Right    TONSILLECTOMY     TOTAL HIP ARTHROPLASTY Right 01/16/2021   Procedure: TOTAL HIP ARTHROPLASTY ANTERIOR APPROACH;  Surgeon: Gaynelle Arabian, MD;  Location: [facility];  Service: Orthopedics;  Laterality: Right;  121mn   uterus ablation     VULVECTOMY N/A 11/08/2019   Procedure: excision of VULVA, vulvoscopy;  Surgeon: GDian Queen MD;  [place]  Service: Gynecology;  Laterality: N/A;   Patient Active Problem List   Diagnosis Date Noted   Liver abscess 09/09/2021   DVT (deep venous  thrombosis) ([place]) 09/03/2021   Bacteremia due to Klebsiella pneumoniae 08/18/2021   Chest pain 08/18/2021   Atypical chest pain 08/17/2021   Hypotension 08/17/2021   Thrombocytopenia ([facility]) 08/17/2021   Hyperglycemia 08/17/2021   Hyponatremia 08/17/2021   Hypokalemia 08/17/2021   Elevated troponin 08/17/2021   Obesity (BMI 30-39.9) 08/17/2021   Asthma 08/17/2021   Anxiety 08/17/2021   Primary osteoarthritis of right hip 01/16/2021   Chronic diastolic HF (heart failure) ([place]) 08/10/2018   GERD (gastroesophageal reflux disease) 08/09/2018   Unstable angina ([place]) 08/09/2018   Carotid artery disease ([place]) 07/24/2017   AAA (abdominal aortic aneurysm) without rupture ([place]) 01/05/2013   Other malaise and fatigue 01/05/2013   Palpitations 01/05/2013   Tobacco use disorder 12/02/2012    Coronary artery disease    Mixed hyperlipidemia    Essential hypertension    SHORTNESS OF BREATH 09/04/2009   CHEST PAIN 09/04/2009    PCP: Ammie Dalton, PA-C  REFERRING PROVIDER: Ammie Dalton, PA-C  REFERRING DIAG: PT eval/tx for chronic back pain and lower extremity weakness per Ammie Dalton, PA  Rationale for Evaluation and Treatment: Rehabilitation  THERAPY DIAG:  Low back pain, unspecified back pain laterality, unspecified chronicity, unspecified whether sciatica present  Muscle weakness (generalized)  Pain in thoracic spine  Difficulty in walking, not elsewhere classified  ONSET DATE: chronic, long time   SUBJECTIVE:                                                                                                                                                                                           SUBJECTIVE STATEMENT:  Has been making some progress; she had a couple of days where she didn't have to take pain meds before today. ;  but prone press up made her worse; Her right hip has been stiff for some reason likely due to cold weather.  Still has trouble with trying to put socks and shoes on . Today's not a good day; 7-8/10 pain today.  Maybe 20% better or so overall;  manual seemed to help some.     Eval subjective: History of chronic back pain; hx of arthritic; right hip already replaced and left hip needs to be done  PERTINENT HISTORY:  Right THA 12/2020; need left one done  PAIN:  Are you having pain? Yes: NPRS scale: 4/10 Pain location: mid and low back Pain description: soreness Aggravating factors: sitting for a long time, prolonged walking Relieving factors: lying down  PRECAUTIONS: None  WEIGHT BEARING RESTRICTIONS: No  FALLS:  Has patient fallen in last 6 months? Yes. Number of falls 1; needed A to get up  LIVING ENVIRONMENT:  Lives with: lives with their spouse Lives in: House/apartment Stairs: Yes: Internal: 10-12 steps; on right  going up and External: 1-2 steps; on right going up, on left going up, and can reach both Has following equipment at home: Single point cane and Walker - 2 wheeled  OCCUPATION: not currently working; on disability  PLOF: Independent  PATIENT GOALS: want to be able to function without so much pain  NEXT MD VISIT:   OBJECTIVE:   DIAGNOSTIC FINDINGS:  None recent  PATIENT SURVEYS:  FOTO 26  SCOGNITION: Overall cognitive status: Within functional limits for tasks assessed     SENSATION: Numbness right thigh  POSTURE: decreased lumbar lordosis and increased thoracic kyphosis  PALPATION: Patient reports tightness and tenderness mid back to low back paraspinals  LUMBAR ROM:   AROM eval 09/17/22 10/15/2022  Flexion 55% *available 65% available* pullin 70%  Available "Pulling"  Extension 25% *availble 25% available* most painful 30% Available painful  Right lateral flexion To 3" above knee  To 2" above knee  Left lateral flexion To 1" above knee  To 1" Above knee  Right rotation     Left rotation      (Blank rows = not tested)   LOWER EXTREMITY MMT:    MMT Right eval Left eval Right 09/17/22 Left 09/17/22 Right 10/15/2022 Left 10/15/2022  Hip flexion 5 3-* 5 4- 5 4+  Hip extension 4+ 3- 3- 4+ 4+ 3-  Hip abduction        Hip adduction        Hip internal rotation        Hip external rotation        Knee flexion   4- 4+ 4+ 4+  Knee extension 5 4+ 5 4+ 5 5  Ankle dorsiflexion '5 5 5 5 5 5  '$ Ankle plantarflexion        Ankle inversion        Ankle eversion         (Blank rows = not tested)    FUNCTIONAL TESTS:  5 times sit to stand: 29 sec  GAIT: Distance walked: 70 ft plus one flight of steps Assistive device utilized: None Level of assistance: Modified independence Comments: slower than normal gait speed  TODAY'S TREATMENT:                                                                                                                               DATE:   10/15/2022 Progress note 5 times sit to stand 23.63 sec FOTO 20  Supine: On moist heat with feet elevated on wedge Abdominial bracing 5" hold x 10 Hamstring stretch with towel 5 x 20" each leg  Prone: Prone on elbows x 2' STM to thoracic and lumbar spine x 5' to decrease pain and increase soft tissue mobility   10/08/22: STS 10x  Standing lumbar extension 10x  Prone: POE Prone press-up 5x 10" 2 sets  Prone hip extension  10 BLE  Manual STM in prone with focus on thoracic/lumbar paraspinals and QL.  3D hip excursion 10x  Seated row GTB Standing shoulder extension GTB 10x   10/03/22 Supine: MHP during exercise Bridge 10x Marching 15x 5" with ab set Hamstring stretch 3x 30" initial with towel then with rope LTR 5x 10" no reports of pain either direction, improved mobility.  POE x 2 min Manual STM in prone with focus on thoracic/lumbar paraspinals and QL.  10/01/22 Supine: MHP with IFS 7, increased to 8.5 Volts CV x 10 min Educated use and ability to Special educational needs teacher with ab set 15 x 5" SKTC 3x 20" towel assistance Decompression RTB 1-4 5 reps LTR 5x 10" (pain with rotation to Rt, pulling in Lt hip)  09/17/22 Progress note 5 x sit to stand 24.4 sec FOTO 24 MMT's and AROM  Supine: LTR x 10 (pulling in left hip) Abdominal bracing 5" hold x 10 Decompression exercise head press, shoulder press, leg press, leg lengthener  5" hold x 10 Bridge x 8  Seated: Scapular retractions x 10 Cervical retraction with extension x 10       09/05/22: Reviewed goals and importance of HEP compliance for maximal benefits Supine: Scapular retraction -HEP Transverse abdominis paired with exhale 10x 5"- HEP Decompression 2-5 5x 5" Prone: Reviewed benefits with prone position- HEP Prone on elbow x 1 min Glut set 10x 5" Heel squeeze 5x 5" Importance of seated posture Seated scapular retraction    08/18/22 Physical therapy evaluation and HEP  instruction    Education details: Patient educated on exam findings, POC, scope of PT, HEP, and good sitting posture education. Person educated: Patient Education method: Explanation, Demonstration, and Handouts Education comprehension: verbalized understanding, returned demonstration, verbal cues required, and tactile cues required   HOME EXERCISE PROGRAM: Access Code: [email or web address] URL: [email or web address] Date: 08/18/2022 Prepared by: [facility] - Rehab  Exercises - Lying Prone  - 2 x daily - 7 x weekly - 1 sets - 1 reps - 2-5 min hold - Prone Gluteal Sets  - 2 x daily - 7 x weekly - 1 sets - 10 reps - 5 sec hold - Supine Scapular Retraction  - 2 x daily - 7 x weekly - 1 sets - 10 reps - Supine Transversus Abdominis Bracing - Hands on Stomach  - 1 x daily - 7 x weekly - 3 sets - 10 reps  Access Code: [email or web address] URL: [email or web address] Date: 09/05/2022 Prepared by: Ihor Austin  Exercises - Lying Prone  - 2 x daily - 7 x weekly - 1 sets - 1 reps - 2-5 min hold - Prone Gluteal Sets  - 2 x daily - 7 x weekly - 1 sets - 10 reps - 5 sec hold - Supine Scapular Retraction  - 2 x daily - 7 x weekly - 1 sets - 10 reps - Supine Transversus Abdominis Bracing - Hands on Stomach  - 1 x daily - 7 x weekly - 3 sets - 10 reps - Prone on Elbows Stretch  - 2 x daily - 7 x weekly - 1 sets - 1 reps - 2-5' hold - Correct Seated Posture  - 1 x daily - 7 x weekly - 3 sets - 10 reps - Seated Scapular Retraction  - 1 x daily - 7 x weekly - 2 sets - 10 reps - 5" hold  10/03/22: - Supine March  - 1 x daily - 7 x weekly - 2 sets - 10 reps -  5" hold - Supine Bridge  - 1 x daily - 7 x weekly - 3 sets - 10 reps - 5" hold - Supine Lower Trunk Rotation  - 1 x daily - 7 x weekly - 3 sets - 5 reps - 10" hold - Hooklying Single Knee to Chest Stretch  - 1 x daily - 7 x weekly - 3 sets - 3 reps - 30" hold - Hooklying Hamstring Stretch with Strap  - 1 x daily - 7 x weekly - 3 sets - 10  reps  ASSESSMENT:  CLINICAL IMPRESSION: Progress note today.  Patient with good improvement with strength demonstrated with MMT's and 5 times sit to stand test.  FOTO about the same.  Patient with overall reports of subjective decrease in pain and improving mobility. Patient demonstrates improved awareness of sitting posture on the edge of mat.   Patient will benefit from continued skilled therapy services to address deficits and promote return to optimal function.     OBJECTIVE IMPAIRMENTS: Abnormal gait, decreased activity tolerance, decreased endurance, decreased mobility, difficulty walking, decreased ROM, decreased strength, hypomobility, increased fascial restrictions, impaired perceived functional ability, increased muscle spasms, impaired flexibility, and pain.   ACTIVITY LIMITATIONS: carrying, lifting, bending, sitting, standing, squatting, sleeping, stairs, transfers, bed mobility, bathing, toileting, dressing, hygiene/grooming, locomotion level, and caring for others  PARTICIPATION LIMITATIONS: meal prep, cleaning, laundry, driving, shopping, and community activity  REHAB POTENTIAL: Good  CLINICAL DECISION MAKING: Stable/uncomplicated  EVALUATION COMPLEXITY: Low   GOALS: Goals reviewed with patient? No  SHORT TERM GOALS: Target date: 09/01/2022  Patient will be independent with initial HEP  Baseline: Goal status: MET  2.  Patient will demonstrate good sitting posture to be able to sit x 15' to ride in a car for community activities without pain > 3/10 in the back.  Baseline: more aware of sitting posture Goal status: IN PROGRESS   LONG TERM GOALS: Target date: 09/15/2022  Patient will be independent in self management strategies to improve quality of life and functional outcomes.  Baseline:  Goal status: IN PROGRESS  2.  Patient will improve FOTO score to predicted value Baseline: 24; 10/15/22 20 Goal status: IN PROGRESS  3.   Patient will report at least 50%  improvement in overall symptoms and/or function to demonstrate improved functional mobility   Baseline: 10/15/22 "20%" better Goal status: IN PROGRESS  4.  Patient will improve 5 times sit to stand score from 29 sec to 20 sec to demonstrate improved functional mobility and increased lower extremity strength.  Baseline: 09/17/22 24.4 sec; 10/15/22 23.63 no UE assist Goal status: IN PROGRESS  5.  Patient will increase lower extremity MMTs to 4+- 5/5 without pain to promote return to ambulation community distances with minimal deviation.  Baseline: see above; 10/15/22 met except left hip extension (needs left THA) Goal status: IN PROGRESS  PLAN:  PT FREQUENCY: 2x/week  PT DURATION: 4 weeks  Therapeutic exercises, Therapeutic activity, Neuromuscular re-education, Balance training, Gait training, Patient/Family education, Joint manipulation, Joint mobilization, Stair training, Orthotic/Fit training, DME instructions, Aquatic Therapy, Dry Needling, Electrical stimulation, Spinal manipulation, Spinal mobilization, Cryotherapy, Moist heat, Compression bandaging, scar mobilization, Splintting, Taping, Traction, Ultrasound, Ionotophoresis '4mg'$ /ml Dexamethasone, and Manual therapy   PLAN FOR NEXT SESSION: progress lumbar mobility and core/ postural strength as able.  Patient needs left THA; request additional visits to address remaining unmet and partially met goals.  2:13 PM, 10/15/22 Chazlyn Cude Small Kyan Yurkovich MPT [facility] [place] [phone]

## 2022-10-21 ENCOUNTER — Encounter (HOSPITAL_COMMUNITY): Payer: Medicare HMO | Admitting: Physical Therapy

## 2022-10-22 ENCOUNTER — Other Ambulatory Visit: Payer: Self-pay

## 2022-10-22 ENCOUNTER — Telehealth (HOSPITAL_COMMUNITY): Payer: Self-pay | Admitting: Physical Therapy

## 2022-10-22 ENCOUNTER — Ambulatory Visit (HOSPITAL_COMMUNITY): Payer: Medicare HMO | Admitting: Physical Therapy

## 2022-10-22 DIAGNOSIS — M545 Low back pain, unspecified: Secondary | ICD-10-CM

## 2022-10-22 DIAGNOSIS — M6281 Muscle weakness (generalized): Secondary | ICD-10-CM

## 2022-10-22 DIAGNOSIS — R262 Difficulty in walking, not elsewhere classified: Secondary | ICD-10-CM | POA: Diagnosis not present

## 2022-10-22 DIAGNOSIS — M546 Pain in thoracic spine: Secondary | ICD-10-CM

## 2022-10-22 NOTE — Therapy (Signed)
OUTPATIENT PHYSICAL THERAPY THORACOLUMBAR TREATMENT   Patient Name: Shannon [name] MRN: [id number] DOB:10/21/1965, 57 y.o., female Today's Date: 10/22/2022  END OF SESSION:  PT End of Session - 10/22/22 1430     Visit Number 8    Number of Visits 16    Date for PT Re-Evaluation 11/23/22    Authorization Type [employer] H[employer]    Authorization Time Period 8 visits approved 1/25-2/25    PT Start Time 1350    PT Stop Time 1430    PT Time Calculation (min) 40 min    Activity Tolerance Patient tolerated treatment well                  Past Medical History:  Diagnosis Date   Anxiety    Arthritis    oa needs hip replacement on right   Asthma    allergies    Cancer ([place])    squamous cell areas removed from vulva and pre caner areas removed from leg    Carotid artery occlusion    Complication of anesthesia    major depression after general anesthesia   Coronary artery disease    LHC 11/25/12 90% stenosis mid LCx & otherwise nonobstructive dz w/ EF 60-65% S/p PTCA/DES to LCx   Depression    Dyspnea    with exertion    Elevated cholesterol    Exertional dyspnea    chronic   Fibromyalgia    GERD (gastroesophageal reflux disease)    History of cardiovascular stress test 06/2015   low risk   HPV in female    Hyperlipidemia    Hypertension    Liver abscess 09/09/2021   MI (myocardial infarction) ([place]) fe 17, 2014   Obesity    Pneumonia    hx of x 2 years ago    Polycystic ovary disease    Skin tear of upper arm without complication, left, sequela    small skin tear left upper arm no drainage for 1 week   Sleep apnea    mild no cpap    Tobacco abuse    Past Surgical History:  Procedure Laterality Date   CORONARY ANGIOPLASTY WITH STENT PLACEMENT     LEFT HEART CATH AND CORONARY ANGIOGRAPHY N/A 08/09/2018   Procedure: LEFT HEART CATH AND CORONARY ANGIOGRAPHY;  Surgeon: Nelva Bush, MD;  Location: [facility];  Service: Cardiovascular;  Laterality:  N/A;   LEFT HEART CATHETERIZATION WITH CORONARY ANGIOGRAM N/A 11/15/2012   Procedure: LEFT HEART CATHETERIZATION WITH CORONARY ANGIOGRAM;  Surgeon: Thayer Headings, MD;  Location: [facility] C[facility];  Service: Cardiovascular;  Laterality: N/A;   PERCUTANEOUS CORONARY STENT INTERVENTION (PCI-S)  11/15/2012   Procedure: PERCUTANEOUS CORONARY STENT INTERVENTION (PCI-S);  Surgeon: Thayer Headings, MD;  Location: [facility] CATH LAB;  Service: Cardiovascular;;   skin cancer removed right lower leg Right    TONSILLECTOMY     TOTAL HIP ARTHROPLASTY Right 01/16/2021   Procedure: TOTAL HIP ARTHROPLASTY ANTERIOR APPROACH;  Surgeon: Gaynelle Arabian, MD;  Location: [facility];  Service: Orthopedics;  Laterality: Right;  152mn   uterus ablation     VULVECTOMY N/A 11/08/2019   Procedure: excision of VULVA, vulvoscopy;  Surgeon: GDian Queen MD;  [place] [facility]  Service: Gynecology;  Laterality: N/A;   Patient Active Problem List   Diagnosis Date Noted   Liver abscess 09/09/2021   DVT [place]deep [place]) [place] 09/03/2021   Bacteremia due to Klebsiella pneumoniae 08/18/2021   Chest pain 08/18/2021  Atypical chest pain 08/17/2021   Hypotension 08/17/2021   Thrombocytopenia ([place]) 08/17/2021   Hyperglycemia 08/17/2021   Hyponatremia 08/17/2021   Hypokalemia 08/17/2021   Elevated troponin 08/17/2021   Obesity (BMI 30-39.9) 08/17/2021   Asthma 08/17/2021   Anxiety 08/17/2021   Primary osteoarthritis of right hip 01/16/2021   Chronic diastolic HF (heart failure) ([place]) 08/10/2018   GERD (gastroesophageal reflux disease) 08/09/2018   Unstable angina ([place]) 08/09/2018   Carotid artery disease ([place]) 07/24/2017   AAA (abdominal aortic aneurysm) without rupture ([place]) 01/05/2013   Other malaise and fatigue 01/05/2013   Palpitations 01/05/2013   Tobacco use disorder 12/02/2012   Coronary artery disease    Mixed hyperlipidemia    Essential hypertension    SHORTNESS OF BREATH 09/04/2009   CHEST  PAIN 09/04/2009    PCP: Ammie Dalton, PA-C  REFERRING PROVIDER: Ammie Dalton, PA-C  REFERRING DIAG: PT eval/tx for chronic back pain and lower extremity weakness per Ammie Dalton, PA  Rationale for Evaluation and Treatment: Rehabilitation  THERAPY DIAG:  Low back pain, unspecified back pain laterality, unspecified chronicity, unspecified whether sciatica present  Muscle weakness (generalized)  Pain in thoracic spine  Difficulty in walking, not elsewhere classified  ONSET DATE: chronic, long time   SUBJECTIVE:                                                                                                                                                                                           SUBJECTIVE STATEMENT:  PT states that she is doing her HEP.  Pt states that the press up causes her increased pain.   PERTINENT HISTORY:  Right THA 12/2020; need left one done  PAIN:  Are you having pain? Yes: NPRS scale: 6/10 Pain location: mid and low back Pain description: soreness Aggravating factors: sitting for a long time, prolonged walking Relieving factors: lying down  PRECAUTIONS: None  WEIGHT BEARING RESTRICTIONS: No  FALLS:  Has patient fallen in last 6 months? Yes. Number of falls 1; needed A to get up  LIVING ENVIRONMENT: Lives with: lives with their spouse Lives in: House/apartment Stairs: Yes: Internal: 10-12 steps; on right going up and External: 1-2 steps; on right going up, on left going up, and can reach both Has following equipment at home: Single point cane and Walker - 2 wheeled  OCCUPATION: not currently working; on disability  PLOF: Independent  PATIENT GOALS: want to be able to function without so much pain  NEXT MD VISIT:   OBJECTIVE:   DIAGNOSTIC FINDINGS:  None recent  PATIENT SURVEYS:  FOTO 26  SCOGNITION: Overall cognitive status: Within functional limits for tasks assessed  SENSATION: Numbness right thigh  POSTURE:  decreased lumbar lordosis and increased thoracic kyphosis  PALPATION: Patient reports tightness and tenderness mid back to low back paraspinals  LUMBAR ROM:   AROM eval 09/17/22 10/15/2022  Flexion 55% *available 65% available* pullin 70%  Available "Pulling"  Extension 25% *availble 25% available* most painful 30% Available painful  Right lateral flexion To 3" above knee  To 2" above knee  Left lateral flexion To 1" above knee  To 1" Above knee  Right rotation     Left rotation      (Blank rows = not tested)   LOWER EXTREMITY MMT:    MMT Right eval Left eval Right 09/17/22 Left 09/17/22 Right 10/15/2022 Left 10/15/2022  Hip flexion 5 3-* 5 4- 5 4+  Hip extension 4+ 3- 3- 4+ 4+ 3-  Hip abduction        Hip adduction        Hip internal rotation        Hip external rotation        Knee flexion   4- 4+ 4+ 4+  Knee extension 5 4+ 5 4+ 5 5  Ankle dorsiflexion '5 5 5 5 5 5  '$ Ankle plantarflexion        Ankle inversion        Ankle eversion         (Blank rows = not tested)    FUNCTIONAL TESTS:  5 times sit to stand: 29 sec  GAIT: Distance walked: 70 ft plus one flight of steps Assistive device utilized: None Level of assistance: Modified independence Comments: slower than normal gait speed  TODAY'S TREATMENT:                                                                                                                              DATE:  10/22/22 Prone: Glut set x 10 Heel squeeze x 10  Single leg raise x 10 B shoulder extension x 10  Forearm plank 5" x 5  4' manual with no mm spasms noted but + tight tissue. Side lying: Clam x 10 Hip abduction x 10  Supine: Bridge x 15 LTR x 5   10/15/2022 Progress note 5 times sit to stand 23.63 sec FOTO 20  Supine: On moist heat with feet elevated on wedge Abdominial bracing 5" hold x 10 Hamstring stretch with towel 5 x 20" each leg  Prone: Prone on elbows x 2' STM to thoracic and lumbar spine x 5' to  decrease pain and increase soft tissue mobility   10/08/22: STS 10x  Standing lumbar extension 10x  Prone: POE Prone press-up 5x 10" 2 sets  Prone hip extension 10 BLE  Manual STM in prone with focus on thoracic/lumbar paraspinals and QL.  3D hip excursion 10x  Seated row GTB Standing shoulder extension GTB 10x   10/03/22 Supine: MHP during exercise Bridge 10x Marching 15x 5" with ab set Hamstring stretch 3x 30" initial  with towel then with rope LTR 5x 10" no reports of pain either direction, improved mobility.  POE x 2 min Manual STM in prone with focus on thoracic/lumbar paraspinals and QL.  10/01/22 Supine: MHP with IFS 7, increased to 8.5 Volts CV x 10 min Educated use and ability to Special educational needs teacher with ab set 15 x 5" SKTC 3x 20" towel assistance Decompression RTB 1-4 5 reps LTR 5x 10" (pain with rotation to Rt, pulling in Lt hip)  09/17/22 Progress note 5 x sit to stand 24.4 sec FOTO 24 MMT's and AROM  Supine: LTR x 10 (pulling in left hip) Abdominal bracing 5" hold x 10 Decompression exercise head press, shoulder press, leg press, leg lengthener  5" hold x 10 Bridge x 8  Seated: Scapular retractions x 10 Cervical retraction with extension x 10   09/05/22: Reviewed goals and importance of HEP compliance for maximal benefits Supine: Scapular retraction -HEP Transverse abdominis paired with exhale 10x 5"- HEP Decompression 2-5 5x 5" Prone: Reviewed benefits with prone position- HEP Prone on elbow x 1 min Glut set 10x 5" Heel squeeze 5x 5" Importance of seated posture Seated scapular retraction    08/18/22 Physical therapy evaluation and HEP instruction    Education details: Patient educated on exam findings, POC, scope of PT, HEP, and good sitting posture education. Person educated: Patient Education method: Explanation, Demonstration, and Handouts Education comprehension: verbalized understanding, returned demonstration, verbal  cues required, and tactile cues required   HOME EXERCISE PROGRAM: Access Code: [email or web address] URL: [email or web address] Date: 08/18/2022 Prepared by: [facility] - Rehab  Exercises - Lying Prone  - 2 x daily - 7 x weekly - 1 sets - 1 reps - 2-5 min hold - Prone Gluteal Sets  - 2 x daily - 7 x weekly - 1 sets - 10 reps - 5 sec hold - Supine Scapular Retraction  - 2 x daily - 7 x weekly - 1 sets - 10 reps - Supine Transversus Abdominis Bracing - Hands on Stomach  - 1 x daily - 7 x weekly - 3 sets - 10 reps  Access Code: [email or web address] URL: [email or web address] Date: 09/05/2022 Prepared by: Ihor Austin  Exercises - Lying Prone  - 2 x daily - 7 x weekly - 1 sets - 1 reps - 2-5 min hold - Prone Gluteal Sets  - 2 x daily - 7 x weekly - 1 sets - 10 reps - 5 sec hold - Supine Scapular Retraction  - 2 x daily - 7 x weekly - 1 sets - 10 reps - Supine Transversus Abdominis Bracing - Hands on Stomach  - 1 x daily - 7 x weekly - 3 sets - 10 reps - Prone on Elbows Stretch  - 2 x daily - 7 x weekly - 1 sets - 1 reps - 2-5' hold - Correct Seated Posture  - 1 x daily - 7 x weekly - 3 sets - 10 reps - Seated Scapular Retraction  - 1 x daily - 7 x weekly - 2 sets - 10 reps - 5" hold  10/03/22: - Supine March  - 1 x daily - 7 x weekly - 2 sets - 10 reps - 5" hold - Supine Bridge  - 1 x daily - 7 x weekly - 3 sets - 10 reps - 5" hold - Supine Lower Trunk Rotation  - 1 x daily - 7 x weekly - 3 sets - 5 reps - 10" hold - Hooklying Single Knee to Chest  Stretch  - 1 x daily - 7 x weekly - 3 sets - 3 reps - 30" hold - Hooklying Hamstring Stretch with Strap  - 1 x daily - 7 x weekly - 3 sets - 10 reps  ASSESSMENT:  CLINICAL IMPRESSION: PT approved for 8 more visits after today.   Re assessment from last visit demonstrated continued weakened gluteal mm therefore therapist focused on strengthening of B gluteal medius and maximus.  Patient will benefit from continued skilled therapy services to  address deficits and promote return to optimal function.     OBJECTIVE IMPAIRMENTS: Abnormal gait, decreased activity tolerance, decreased endurance, decreased mobility, difficulty walking, decreased ROM, decreased strength, hypomobility, increased fascial restrictions, impaired perceived functional ability, increased muscle spasms, impaired flexibility, and pain.   ACTIVITY LIMITATIONS: carrying, lifting, bending, sitting, standing, squatting, sleeping, stairs, transfers, bed mobility, bathing, toileting, dressing, hygiene/grooming, locomotion level, and caring for others  PARTICIPATION LIMITATIONS: meal prep, cleaning, laundry, driving, shopping, and community activity  REHAB POTENTIAL: Good  CLINICAL DECISION MAKING: Stable/uncomplicated  EVALUATION COMPLEXITY: Low   GOALS: Goals reviewed with patient? No  SHORT TERM GOALS: Target date: 09/01/2022  Patient will be independent with initial HEP  Baseline: Goal status: MET  2.  Patient will demonstrate good sitting posture to be able to sit x 15' to ride in a car for community activities without pain > 3/10 in the back.  Baseline: more aware of sitting posture Goal status: IN PROGRESS   LONG TERM GOALS: Target date: 09/15/2022  Patient will be independent in self management strategies to improve quality of life and functional outcomes.  Baseline:  Goal status: IN PROGRESS  2.  Patient will improve FOTO score to predicted value Baseline: 24; 10/15/22 20 Goal status: IN PROGRESS  3.   Patient will report at least 50% improvement in overall symptoms and/or function to demonstrate improved functional mobility   Baseline: 10/15/22 "20%" better Goal status: IN PROGRESS  4.  Patient will improve 5 times sit to stand score from 29 sec to 20 sec to demonstrate improved functional mobility and increased lower extremity strength.  Baseline: 09/17/22 24.4 sec; 10/15/22 23.63 no UE assist Goal status: IN PROGRESS  5.  Patient will  increase lower extremity MMTs to 4+- 5/5 without pain to promote return to ambulation community distances with minimal deviation.  Baseline: see above; 10/15/22 met except left hip extension (needs left THA) Goal status: IN PROGRESS  PLAN:  PT FREQUENCY: 2x/week an additional 4 weeks for a total of 16 visits   PT DURATION: 4 weeks  Therapeutic exercises, Therapeutic activity, Neuromuscular re-education, Balance training, Gait training, Patient/Family education, Joint manipulation, Joint mobilization, Stair training, Orthotic/Fit training, DME instructions, Aquatic Therapy, Dry Needling, Electrical stimulation, Spinal manipulation, Spinal mobilization, Cryotherapy, Moist heat, Compression bandaging, scar mobilization, Splintting, Taping, Traction, Ultrasound, Ionotophoresis '4mg'$ /ml Dexamethasone, and Manual therapy   PLAN FOR NEXT SESSION: progress to standing  lumbar mobility and core/ postural strength as able.  Patient needs left THA; request additional visits to address remaining unmet and partially met goals.  Rayetta Humphrey, [place] [phone]  [phone]

## 2022-10-22 NOTE — Telephone Encounter (Signed)
Entered in error  Romolo Sieling, PT CLT [phone] 

## 2022-10-23 ENCOUNTER — Ambulatory Visit (HOSPITAL_COMMUNITY): Payer: Medicare HMO

## 2022-10-23 DIAGNOSIS — M545 Low back pain, unspecified: Secondary | ICD-10-CM | POA: Diagnosis not present

## 2022-10-23 DIAGNOSIS — M6281 Muscle weakness (generalized): Secondary | ICD-10-CM | POA: Diagnosis not present

## 2022-10-23 DIAGNOSIS — M546 Pain in thoracic spine: Secondary | ICD-10-CM

## 2022-10-23 DIAGNOSIS — R262 Difficulty in walking, not elsewhere classified: Secondary | ICD-10-CM | POA: Diagnosis not present

## 2022-10-23 NOTE — Therapy (Signed)
OUTPATIENT PHYSICAL THERAPY THORACOLUMBAR TREATMENT   Patient Name: Shannon [name] MRN: [id number] DOB:1966-06-19, 57 y.o., female Today's Date: 10/23/2022  END OF SESSION: END OF SESSION:   PT End of Session - 10/23/22 1348     Visit Number 9    Number of Visits 16    Date for PT Re-Evaluation 11/23/22    Authorization Type [employer] H[employer]    Authorization Time Period 8 visits approved 1/25-2/25    PT Start Time 0148    PT Stop Time 0230    PT Time Calculation (min) 42 min    Activity Tolerance Patient tolerated treatment well                      Past Medical History:  Diagnosis Date   Anxiety    Arthritis    oa needs hip replacement on right   Asthma    allergies    Cancer ([place])    squamous cell areas removed from vulva and pre caner areas removed from leg    Carotid artery occlusion    Complication of anesthesia    major depression after general anesthesia   Coronary artery disease    LHC 11/25/12 90% stenosis mid LCx & otherwise nonobstructive dz w/ EF 60-65% S/p PTCA/DES to LCx   Depression    Dyspnea    with exertion    Elevated cholesterol    Exertional dyspnea    chronic   Fibromyalgia    GERD (gastroesophageal reflux disease)    History of cardiovascular stress test 06/2015   low risk   HPV in female    Hyperlipidemia    Hypertension    Liver abscess 09/09/2021   MI (myocardial infarction) ([place]) fe 17, 2014   Obesity    Pneumonia    hx of x 2 years ago    Polycystic ovary disease    Skin tear of upper arm without complication, left, sequela    small skin tear left upper arm no drainage for 1 week   Sleep apnea    mild no cpap    Tobacco abuse    Past Surgical History:  Procedure Laterality Date   CORONARY ANGIOPLASTY WITH STENT PLACEMENT     LEFT HEART CATH AND CORONARY ANGIOGRAPHY N/A 08/09/2018   Procedure: LEFT HEART CATH AND CORONARY ANGIOGRAPHY;  Surgeon: Nelva Bush, MD;  Location: [facility];  Service:  Cardiovascular;  Laterality: N/A;   LEFT HEART CATHETERIZATION WITH CORONARY ANGIOGRAM N/A 11/15/2012   Procedure: LEFT HEART CATHETERIZATION WITH CORONARY ANGIOGRAM;  Surgeon: Thayer Headings, MD;  Location: [facility] CATH LAB;  Service: Cardiovascular;  Laterality: N/A;   PERCUTANEOUS CORONARY STENT INTERVENTION (PCI-S)  11/15/2012   Procedure: PERCUTANEOUS CORONARY STENT INTERVENTION (PCI-S);  Surgeon: Thayer Headings, MD;  Location: [facility] CATH LAB;  Service: Cardiovascular;;   skin cancer removed right lower leg Right    TONSILLECTOMY     TOTAL HIP ARTHROPLASTY Right 01/16/2021   Procedure: TOTAL HIP ARTHROPLASTY ANTERIOR APPROACH;  Surgeon: Gaynelle Arabian, MD;  Location: [facility];  Service: Orthopedics;  Laterality: Right;  111mn   uterus ablation     VULVECTOMY N/A 11/08/2019   Procedure: excision of VULVA, vulvoscopy;  Surgeon: GDian Queen MD;  Location: [facility]  Service: Gynecology;  Laterality: N/A;   Patient Active Problem List   Diagnosis Date Noted   Liver abscess 09/09/2021   DVT [place]) [place] 09/03/2021   Bacteremia due to Klebsiella  pneumoniae 08/18/2021   Chest pain 08/18/2021   Atypical chest pain 08/17/2021   Hypotension 08/17/2021   Thrombocytopenia ([place]) 08/17/2021   Hyperglycemia 08/17/2021   Hyponatremia 08/17/2021   Hypokalemia 08/17/2021   Elevated troponin 08/17/2021   Obesity (BMI 30-39.9) 08/17/2021   Asthma 08/17/2021   Anxiety 08/17/2021   Primary osteoarthritis of right hip 01/16/2021   Chronic diastolic HF (heart failure) ([place]) 08/10/2018   GERD (gastroesophageal reflux disease) 08/09/2018   Unstable angina ([place]) 08/09/2018   Carotid artery disease ([place]) 07/24/2017   AAA (abdominal aortic aneurysm) without rupture ([place]) 01/05/2013   Other malaise and fatigue 01/05/2013   Palpitations 01/05/2013   Tobacco use disorder 12/02/2012   Coronary artery disease    Mixed hyperlipidemia    Essential hypertension    SHORTNESS OF  BREATH 09/04/2009   CHEST PAIN 09/04/2009    PCP: Ammie Dalton, PA-C  REFERRING PROVIDER: Ammie Dalton, PA-C  REFERRING DIAG: PT eval/tx for chronic back pain and lower extremity weakness per Ammie Dalton, PA  Rationale for Evaluation and Treatment: Rehabilitation  THERAPY DIAG:  Low back pain, unspecified back pain laterality, unspecified chronicity, unspecified whether sciatica present  Muscle weakness (generalized)  Pain in thoracic spine  Difficulty in walking, not elsewhere classified  ONSET DATE: chronic, long time   SUBJECTIVE:                                                                                                                                                                                           SUBJECTIVE STATEMENT:  Right leg right behind her knee is bothering her today feels like it she is sore from treatment yesterday. Feeling a little down, 2 friends are in the hospital right now.    PERTINENT HISTORY:  Right THA 12/2020; need left one done  PAIN:  Are you having pain? Yes: NPRS scale: 5-6/10 Pain location: mid and low back Pain description: soreness Aggravating factors: sitting for a long time, prolonged walking Relieving factors: lying down  PRECAUTIONS: None  WEIGHT BEARING RESTRICTIONS: No  FALLS:  Has patient fallen in last 6 months? Yes. Number of falls 1; needed A to get up  LIVING ENVIRONMENT: Lives with: lives with their spouse Lives in: House/apartment Stairs: Yes: Internal: 10-12 steps; on right going up and External: 1-2 steps; on right going up, on left going up, and can reach both Has following equipment at home: Single point cane and Walker - 2 wheeled  OCCUPATION: not currently working; on disability  PLOF: Independent  PATIENT GOALS: want to be able to function without so much pain  NEXT MD VISIT:   OBJECTIVE:  DIAGNOSTIC FINDINGS:  None recent  PATIENT SURVEYS:  FOTO 26  SCOGNITION: Overall  cognitive status: Within functional limits for tasks assessed     SENSATION: Numbness right thigh  POSTURE: decreased lumbar lordosis and increased thoracic kyphosis  PALPATION: Patient reports tightness and tenderness mid back to low back paraspinals  LUMBAR ROM:   AROM eval 09/17/22 10/15/2022  Flexion 55% *available 65% available* pullin 70%  Available "Pulling"  Extension 25% *availble 25% available* most painful 30% Available painful  Right lateral flexion To 3" above knee  To 2" above knee  Left lateral flexion To 1" above knee  To 1" Above knee  Right rotation     Left rotation      (Blank rows = not tested)   LOWER EXTREMITY MMT:    MMT Right eval Left eval Right 09/17/22 Left 09/17/22 Right 10/15/2022 Left 10/15/2022  Hip flexion 5 3-* 5 4- 5 4+  Hip extension 4+ 3- 3- 4+ 4+ 3-  Hip abduction        Hip adduction        Hip internal rotation        Hip external rotation        Knee flexion   4- 4+ 4+ 4+  Knee extension 5 4+ 5 4+ 5 5  Ankle dorsiflexion '5 5 5 5 5 5  '$ Ankle plantarflexion        Ankle inversion        Ankle eversion         (Blank rows = not tested)    FUNCTIONAL TESTS:  5 times sit to stand: 29 sec  GAIT: Distance walked: 70 ft plus one flight of steps Assistive device utilized: None Level of assistance: Modified independence Comments: slower than normal gait speed  TODAY'S TREATMENT:                                                                                                                              DATE:  10/23/22 Supine: On moist heat with feet elevated on wedge Ab set 5" hold x 12 Hamstring stretch with a towel 5 x 20" Supine hooklying: LTR x 12  Bridge x 12 (reminder to perform ab set prior) Hip abduction with RTB x 10  Standing: RTB scapular retraction x 10    10/22/22 Prone: Glut set x 10 Heel squeeze x 10  Single leg raise x 10 B shoulder extension x 10  Forearm plank 5" x 5  4' manual with no mm  spasms noted but + tight tissue. Side lying: Clam x 10 Hip abduction x 10  Supine: Bridge x 15 LTR x 5   10/15/2022 Progress note 5 times sit to stand 23.63 sec FOTO 20  Supine: On moist heat with feet elevated on wedge Abdominial bracing 5" hold x 10 Hamstring stretch with towel 5 x 20" each leg  Prone: Prone on elbows x 2' STM to thoracic and lumbar spine  x 5' to decrease pain and increase soft tissue mobility   10/08/22: STS 10x  Standing lumbar extension 10x  Prone: POE Prone press-up 5x 10" 2 sets  Prone hip extension 10 BLE  Manual STM in prone with focus on thoracic/lumbar paraspinals and QL.  3D hip excursion 10x  Seated row GTB Standing shoulder extension GTB 10x   10/03/22 Supine: MHP during exercise Bridge 10x Marching 15x 5" with ab set Hamstring stretch 3x 30" initial with towel then with rope LTR 5x 10" no reports of pain either direction, improved mobility.  POE x 2 min Manual STM in prone with focus on thoracic/lumbar paraspinals and QL.  10/01/22 Supine: MHP with IFS 7, increased to 8.5 Volts CV x 10 min Educated use and ability to Special educational needs teacher with ab set 15 x 5" SKTC 3x 20" towel assistance Decompression RTB 1-4 5 reps LTR 5x 10" (pain with rotation to Rt, pulling in Lt hip)  09/17/22 Progress note 5 x sit to stand 24.4 sec FOTO 24 MMT's and AROM  Supine: LTR x 10 (pulling in left hip) Abdominal bracing 5" hold x 10 Decompression exercise head press, shoulder press, leg press, leg lengthener  5" hold x 10 Bridge x 8  Seated: Scapular retractions x 10 Cervical retraction with extension x 10   09/05/22: Reviewed goals and importance of HEP compliance for maximal benefits Supine: Scapular retraction -HEP Transverse abdominis paired with exhale 10x 5"- HEP Decompression 2-5 5x 5" Prone: Reviewed benefits with prone position- HEP Prone on elbow x 1 min Glut set 10x 5" Heel squeeze 5x 5" Importance of seated  posture Seated scapular retraction    08/18/22 Physical therapy evaluation and HEP instruction    Education details: Patient educated on exam findings, POC, scope of PT, HEP, and good sitting posture education. Person educated: Patient Education method: Explanation, Demonstration, and Handouts Education comprehension: verbalized understanding, returned demonstration, verbal cues required, and tactile cues required   HOME EXERCISE PROGRAM: Access Code: [email or web address] URL: [email or web address] Date: 08/18/2022 Prepared by: [facility] - Rehab  Exercises - Lying Prone  - 2 x daily - 7 x weekly - 1 sets - 1 reps - 2-5 min hold - Prone Gluteal Sets  - 2 x daily - 7 x weekly - 1 sets - 10 reps - 5 sec hold - Supine Scapular Retraction  - 2 x daily - 7 x weekly - 1 sets - 10 reps - Supine Transversus Abdominis Bracing - Hands on Stomach  - 1 x daily - 7 x weekly - 3 sets - 10 reps  Access Code: [email or web address] URL: [email or web address] Date: 09/05/2022 Prepared by: Ihor Austin  Exercises - Lying Prone  - 2 x daily - 7 x weekly - 1 sets - 1 reps - 2-5 min hold - Prone Gluteal Sets  - 2 x daily - 7 x weekly - 1 sets - 10 reps - 5 sec hold - Supine Scapular Retraction  - 2 x daily - 7 x weekly - 1 sets - 10 reps - Supine Transversus Abdominis Bracing - Hands on Stomach  - 1 x daily - 7 x weekly - 3 sets - 10 reps - Prone on Elbows Stretch  - 2 x daily - 7 x weekly - 1 sets - 1 reps - 2-5' hold - Correct Seated Posture  - 1 x daily - 7 x weekly - 3 sets - 10 reps - Seated Scapular Retraction  - 1 x daily - 7 x weekly -  2 sets - 10 reps - 5" hold  10/03/22: - Supine March  - 1 x daily - 7 x weekly - 2 sets - 10 reps - 5" hold - Supine Bridge  - 1 x daily - 7 x weekly - 3 sets - 10 reps - 5" hold - Supine Lower Trunk Rotation  - 1 x daily - 7 x weekly - 3 sets - 5 reps - 10" hold - Hooklying Single Knee to Chest Stretch  - 1 x daily - 7 x weekly - 3 sets - 3 reps - 30"  hold - Hooklying Hamstring Stretch with Strap  - 1 x daily - 7 x weekly - 3 sets - 10 reps  ASSESSMENT:  CLINICAL IMPRESSION: Today's session continued to focus on lower extremity and core strengthening.  Patient able to clear buttocks with bridge today. Needs cues to engage abs with all exercise. Has some noted right knee soreness but did not increase with exercise; updated HEP and issued RTB for home use.   Patient will benefit from continued skilled therapy services to address deficits and promote return to optimal function.     OBJECTIVE IMPAIRMENTS: Abnormal gait, decreased activity tolerance, decreased endurance, decreased mobility, difficulty walking, decreased ROM, decreased strength, hypomobility, increased fascial restrictions, impaired perceived functional ability, increased muscle spasms, impaired flexibility, and pain.   ACTIVITY LIMITATIONS: carrying, lifting, bending, sitting, standing, squatting, sleeping, stairs, transfers, bed mobility, bathing, toileting, dressing, hygiene/grooming, locomotion level, and caring for others  PARTICIPATION LIMITATIONS: meal prep, cleaning, laundry, driving, shopping, and community activity  REHAB POTENTIAL: Good  CLINICAL DECISION MAKING: Stable/uncomplicated  EVALUATION COMPLEXITY: Low   GOALS: Goals reviewed with patient? No  SHORT TERM GOALS: Target date: 09/01/2022  Patient will be independent with initial HEP  Baseline: Goal status: MET  2.  Patient will demonstrate good sitting posture to be able to sit x 15' to ride in a car for community activities without pain > 3/10 in the back.  Baseline: more aware of sitting posture Goal status: IN PROGRESS   LONG TERM GOALS: Target date: 09/15/2022  Patient will be independent in self management strategies to improve quality of life and functional outcomes.  Baseline:  Goal status: IN PROGRESS  2.  Patient will improve FOTO score to predicted value Baseline: 24; 10/15/22  20 Goal status: IN PROGRESS  3.   Patient will report at least 50% improvement in overall symptoms and/or function to demonstrate improved functional mobility   Baseline: 10/15/22 "20%" better Goal status: IN PROGRESS  4.  Patient will improve 5 times sit to stand score from 29 sec to 20 sec to demonstrate improved functional mobility and increased lower extremity strength.  Baseline: 09/17/22 24.4 sec; 10/15/22 23.63 no UE assist Goal status: IN PROGRESS  5.  Patient will increase lower extremity MMTs to 4+- 5/5 without pain to promote return to ambulation community distances with minimal deviation.  Baseline: see above; 10/15/22 met except left hip extension (needs left THA) Goal status: IN PROGRESS  PLAN:  PT FREQUENCY: 2x/week an additional 4 weeks for a total of 16 visits   PT DURATION: 4 weeks  Therapeutic exercises, Therapeutic activity, Neuromuscular re-education, Balance training, Gait training, Patient/Family education, Joint manipulation, Joint mobilization, Stair training, Orthotic/Fit training, DME instructions, Aquatic Therapy, Dry Needling, Electrical stimulation, Spinal manipulation, Spinal mobilization, Cryotherapy, Moist heat, Compression bandaging, scar mobilization, Splintting, Taping, Traction, Ultrasound, Ionotophoresis '4mg'$ /ml Dexamethasone, and Manual therapy   PLAN FOR NEXT SESSION: progress to standing  lumbar mobility  and core/ postural strength as able.  Patient needs left THA 2:34 PM, 10/23/22 Cal[place]a Craigo Small Shaundrea Carrigg MPT [facility] [place] [phone]

## 2022-10-30 ENCOUNTER — Encounter (HOSPITAL_COMMUNITY): Payer: Medicare HMO | Admitting: Physical Therapy

## 2022-10-31 DIAGNOSIS — E559 Vitamin D deficiency, unspecified: Secondary | ICD-10-CM | POA: Diagnosis not present

## 2022-10-31 DIAGNOSIS — I25119 Atherosclerotic heart disease of native coronary artery with unspecified angina pectoris: Secondary | ICD-10-CM | POA: Diagnosis not present

## 2022-10-31 DIAGNOSIS — E782 Mixed hyperlipidemia: Secondary | ICD-10-CM | POA: Diagnosis not present

## 2022-10-31 DIAGNOSIS — F332 Major depressive disorder, recurrent severe without psychotic features: Secondary | ICD-10-CM | POA: Diagnosis not present

## 2022-10-31 DIAGNOSIS — F411 Generalized anxiety disorder: Secondary | ICD-10-CM | POA: Diagnosis not present

## 2022-10-31 DIAGNOSIS — M4602 Spinal enthesopathy, cervical region: Secondary | ICD-10-CM | POA: Diagnosis not present

## 2022-10-31 DIAGNOSIS — J449 Chronic obstructive pulmonary disease, unspecified: Secondary | ICD-10-CM | POA: Diagnosis not present

## 2022-10-31 DIAGNOSIS — G894 Chronic pain syndrome: Secondary | ICD-10-CM | POA: Diagnosis not present

## 2022-11-06 ENCOUNTER — Encounter (HOSPITAL_COMMUNITY): Payer: Medicare HMO | Admitting: Physical Therapy

## 2022-11-11 ENCOUNTER — Ambulatory Visit (HOSPITAL_COMMUNITY): Payer: Medicare HMO | Attending: Family Medicine

## 2022-11-11 DIAGNOSIS — M6281 Muscle weakness (generalized): Secondary | ICD-10-CM | POA: Insufficient documentation

## 2022-11-11 DIAGNOSIS — M545 Low back pain, unspecified: Secondary | ICD-10-CM | POA: Diagnosis not present

## 2022-11-11 DIAGNOSIS — M546 Pain in thoracic spine: Secondary | ICD-10-CM | POA: Diagnosis not present

## 2022-11-11 DIAGNOSIS — R262 Difficulty in walking, not elsewhere classified: Secondary | ICD-10-CM | POA: Insufficient documentation

## 2022-11-11 NOTE — Therapy (Signed)
OUTPATIENT PHYSICAL THERAPY THORACOLUMBAR TREATMENT   Patient Name: Shannon [name] MRN: [id number] DOB:1966-06-24, 57 y.o., female Today's Date: 11/11/2022  END OF SESSION: END OF SESSION:   PT End of Session - 11/11/22 1348     Visit Number 10    Number of Visits 16    Date for PT Re-Evaluation 11/23/22    Authorization Type [employer] H[employer]    Authorization Time Period 8 visits approved 1/25-2/25    PT Start Time 1348    PT Stop Time 1430    PT Time Calculation (min) 42 min    Activity Tolerance Patient tolerated treatment well    Behavior During Therapy WFL for tasks assessed/performed                      Past Medical History:  Diagnosis Date   Anxiety    Arthritis    oa needs hip replacement on right   Asthma    allergies    Cancer ([place])    squamous cell areas removed from vulva and pre caner areas removed from leg    Carotid artery occlusion    Complication of anesthesia    major depression after general anesthesia   Coronary artery disease    LHC 11/25/12 90% stenosis mid LCx & otherwise nonobstructive dz w/ EF 60-65% S/p PTCA/DES to LCx   Depression    Dyspnea    with exertion    Elevated cholesterol    Exertional dyspnea    chronic   Fibromyalgia    GERD (gastroesophageal reflux disease)    History of cardiovascular stress test 06/2015   low risk   HPV in female    Hyperlipidemia    Hypertension    Liver abscess 09/09/2021   MI (myocardial infarction) ([place]) fe 17, 2014   Obesity    Pneumonia    hx of x 2 years ago    Polycystic ovary disease    Skin tear of upper arm without complication, left, sequela    small skin tear left upper arm no drainage for 1 week   Sleep apnea    mild no cpap    Tobacco abuse    Past Surgical History:  Procedure Laterality Date   CORONARY ANGIOPLASTY WITH STENT PLACEMENT     LEFT HEART CATH AND CORONARY ANGIOGRAPHY N/A 08/09/2018   Procedure: LEFT HEART CATH AND CORONARY ANGIOGRAPHY;  Surgeon: Nelva Bush, MD;  Location: [facility];  Service: Cardiovascular;  Laterality: N/A;   LEFT HEART CATHETERIZATION WITH CORONARY ANGIOGRAM N/A 11/15/2012   Procedure: LEFT HEART CATHETERIZATION WITH CORONARY ANGIOGRAM;  Surgeon: Thayer Headings, MD;  Location: [facility] C[facility];  Service: Cardiovascular;  Laterality: N/A;   PERCUTANEOUS CORONARY STENT INTERVENTION (PCI-S)  11/15/2012   Procedure: PERCUTANEOUS CORONARY STENT INTERVENTION (PCI-S);  Surgeon: Thayer Headings, MD;  Location: [facility] CATH LAB;  Service: Cardiovascular;;   skin cancer removed right lower leg Right    TONSILLECTOMY     TOTAL HIP ARTHROPLASTY Right 01/16/2021   Procedure: TOTAL HIP ARTHROPLASTY ANTERIOR APPROACH;  Surgeon: Gaynelle Arabian, MD;  Location: [facility];  Service: Orthopedics;  Laterality: Right;  183mn   uterus ablation     VULVECTOMY N/A 11/08/2019   Procedure: excision of VULVA, vulvoscopy;  Surgeon: GDian Queen MD;  [place] [facility]  Service: Gynecology;  Laterality: N/A;   Patient Active Problem List   Diagnosis Date Noted   Liver abscess 09/09/2021   DVT (deep  venous thrombosis) ([place]) 09/03/2021   Bacteremia due to Klebsiella pneumoniae 08/18/2021   Chest pain 08/18/2021   Atypical chest pain 08/17/2021   Hypotension 08/17/2021   Thrombocytopenia ([facility]) 08/17/2021   Hyperglycemia 08/17/2021   Hyponatremia 08/17/2021   Hypokalemia 08/17/2021   Elevated troponin 08/17/2021   Obesity (BMI 30-39.9) 08/17/2021   Asthma 08/17/2021   Anxiety 08/17/2021   Primary osteoarthritis of right hip 01/16/2021   Chronic diastolic HF (heart failure) ([place]) 08/10/2018   GERD (gastroesophageal reflux disease) 08/09/2018   Unstable angina ([place]) 08/09/2018   Carotid artery disease ([place]) 07/24/2017   AAA (abdominal aortic aneurysm) without rupture ([place]) 01/05/2013   Other malaise and fatigue 01/05/2013   Palpitations 01/05/2013   Tobacco use disorder 12/02/2012   Coronary artery disease    Mixed  hyperlipidemia    Essential hypertension    SHORTNESS OF BREATH 09/04/2009   CHEST PAIN 09/04/2009    PCP: Ammie Dalton, PA-C  REFERRING PROVIDER: Ammie Dalton, PA-C  REFERRING DIAG: PT eval/tx for chronic back pain and lower extremity weakness per Ammie Dalton, PA  Rationale for Evaluation and Treatment: Rehabilitation  THERAPY DIAG:  Low back pain, unspecified back pain laterality, unspecified chronicity, unspecified whether sciatica present  Muscle weakness (generalized)  Pain in thoracic spine  Difficulty in walking, not elsewhere classified  ONSET DATE: chronic, long time   SUBJECTIVE:                                                                                                                                                                                           SUBJECTIVE STATEMENT:  Has been sick; had to take prednisone and that made her back feel some better; still has a little cough wearing a mask on arrival.   "About 30 to 40% better" still hard bending over to pick up things and put on my shoe on my right foot.    PERTINENT HISTORY:  Right THA 12/2020; need left one done  PAIN:  Are you having pain? Yes: NPRS scale: 5-6/10 Pain location: mid and low back Pain description: soreness Aggravating factors: sitting for a long time, prolonged walking Relieving factors: lying down  PRECAUTIONS: None  WEIGHT BEARING RESTRICTIONS: No  FALLS:  Has patient fallen in last 6 months? Yes. Number of falls 1; needed A to get up  LIVING ENVIRONMENT: Lives with: lives with their spouse Lives in: House/apartment Stairs: Yes: Internal: 10-12 steps; on right going up and External: 1-2 steps; on right going up, on left going up, and can reach both Has following equipment at home: Single point cane and Walker - 2 wheeled  OCCUPATION: not currently  working; on disability  PLOF: Independent  PATIENT GOALS: want to be able to function without so much  pain  NEXT MD VISIT:   OBJECTIVE:   DIAGNOSTIC FINDINGS:  None recent  PATIENT SURVEYS:  FOTO 43  SCOGNITION: Overall cognitive status: Within functional limits for tasks assessed     SENSATION: Numbness right thigh  POSTURE: decreased lumbar lordosis and increased thoracic kyphosis  PALPATION: Patient reports tightness and tenderness mid back to low back paraspinals  LUMBAR ROM:   AROM eval 09/17/22 10/15/2022  Flexion 55% *available 65% available* pullin 70%  Available "Pulling"  Extension 25% *availble 25% available* most painful 30% Available painful  Right lateral flexion To 3" above knee  To 2" above knee  Left lateral flexion To 1" above knee  To 1" Above knee  Right rotation     Left rotation      (Blank rows = not tested)   LOWER EXTREMITY MMT:    MMT Right eval Left eval Right 09/17/22 Left 09/17/22 Right 10/15/2022 Left 10/15/2022  Hip flexion 5 3-* 5 4- 5 4+  Hip extension 4+ 3- 3- 4+ 4+ 3-  Hip abduction        Hip adduction        Hip internal rotation        Hip external rotation        Knee flexion   4- 4+ 4+ 4+  Knee extension 5 4+ 5 4+ 5 5  Ankle dorsiflexion 5 5 5 5 5 5  $ Ankle plantarflexion        Ankle inversion        Ankle eversion         (Blank rows = not tested)    FUNCTIONAL TESTS:  5 times sit to stand: 29 sec  GAIT: Distance walked: 70 ft plus one flight of steps Assistive device utilized: None Level of assistance: Modified independence Comments: slower than normal gait speed  TODAY'S TREATMENT:                                                                                                                              DATE:  11/11/22 Progress note today 5 times sit to stand 20.32 sec 2 MWT 321 ft FOTO 30  Supine: Hamstring stretch with towel 5 x 20" Ab set 5" x 10 Bridge 5" x 10   10/23/22 Supine: On moist heat with feet elevated on wedge Ab set 5" hold x 12 Hamstring stretch with a towel 5 x  20" Supine hooklying: LTR x 12  Bridge x 12 (reminder to perform ab set prior) Hip abduction with RTB x 10  Standing: RTB scapular retraction x 10    10/22/22 Prone: Glut set x 10 Heel squeeze x 10  Single leg raise x 10 B shoulder extension x 10  Forearm plank 5" x 5  4' manual with no mm spasms noted but + tight tissue. Side lying: Clam x  10 Hip abduction x 10  Supine: Bridge x 15 LTR x 5   10/15/2022 Progress note 5 times sit to stand 23.63 sec FOTO 20  Supine: On moist heat with feet elevated on wedge Abdominial bracing 5" hold x 10 Hamstring stretch with towel 5 x 20" each leg  Prone: Prone on elbows x 2' STM to thoracic and lumbar spine x 5' to decrease pain and increase soft tissue mobility   10/08/22: STS 10x  Standing lumbar extension 10x  Prone: POE Prone press-up 5x 10" 2 sets  Prone hip extension 10 BLE  Manual STM in prone with focus on thoracic/lumbar paraspinals and QL.  3D hip excursion 10x  Seated row GTB Standing shoulder extension GTB 10x   10/03/22 Supine: MHP during exercise Bridge 10x Marching 15x 5" with ab set Hamstring stretch 3x 30" initial with towel then with rope LTR 5x 10" no reports of pain either direction, improved mobility.  POE x 2 min Manual STM in prone with focus on thoracic/lumbar paraspinals and QL.  10/01/22 Supine: MHP with IFS 7, increased to 8.5 Volts CV x 10 min Educated use and ability to Special educational needs teacher with ab set 15 x 5" SKTC 3x 20" towel assistance Decompression RTB 1-4 5 reps LTR 5x 10" (pain with rotation to Rt, pulling in Lt hip)  09/17/22 Progress note 5 x sit to stand 24.4 sec FOTO 24 MMT's and AROM  Supine: LTR x 10 (pulling in left hip) Abdominal bracing 5" hold x 10 Decompression exercise head press, shoulder press, leg press, leg lengthener  5" hold x 10 Bridge x 8  Seated: Scapular retractions x 10 Cervical retraction with extension x 10   09/05/22: Reviewed  goals and importance of HEP compliance for maximal benefits Supine: Scapular retraction -HEP Transverse abdominis paired with exhale 10x 5"- HEP Decompression 2-5 5x 5" Prone: Reviewed benefits with prone position- HEP Prone on elbow x 1 min Glut set 10x 5" Heel squeeze 5x 5" Importance of seated posture Seated scapular retraction    08/18/22 Physical therapy evaluation and HEP instruction    Education details: Patient educated on exam findings, POC, scope of PT, HEP, and good sitting posture education. Person educated: Patient Education method: Explanation, Demonstration, and Handouts Education comprehension: verbalized understanding, returned demonstration, verbal cues required, and tactile cues required   HOME EXERCISE PROGRAM: Access Code: [email or web address] URL: [email or web address] Date: 08/18/2022 Prepared by: [facility] - Rehab  Exercises - Lying Prone  - 2 x daily - 7 x weekly - 1 sets - 1 reps - 2-5 min hold - Prone Gluteal Sets  - 2 x daily - 7 x weekly - 1 sets - 10 reps - 5 sec hold - Supine Scapular Retraction  - 2 x daily - 7 x weekly - 1 sets - 10 reps - Supine Transversus Abdominis Bracing - Hands on Stomach  - 1 x daily - 7 x weekly - 3 sets - 10 reps  Access Code: [email or web address] URL: [email or web address] Date: 09/05/2022 Prepared by: Ihor Austin  Exercises - Lying Prone  - 2 x daily - 7 x weekly - 1 sets - 1 reps - 2-5 min hold - Prone Gluteal Sets  - 2 x daily - 7 x weekly - 1 sets - 10 reps - 5 sec hold - Supine Scapular Retraction  - 2 x daily - 7 x weekly - 1 sets - 10 reps - Supine Transversus Abdominis Bracing - Hands on Stomach  - 1 x daily -  7 x weekly - 3 sets - 10 reps - Prone on Elbows Stretch  - 2 x daily - 7 x weekly - 1 sets - 1 reps - 2-5' hold - Correct Seated Posture  - 1 x daily - 7 x weekly - 3 sets - 10 reps - Seated Scapular Retraction  - 1 x daily - 7 x weekly - 2 sets - 10 reps - 5" hold  10/03/22: - Supine March   - 1 x daily - 7 x weekly - 2 sets - 10 reps - 5" hold - Supine Bridge  - 1 x daily - 7 x weekly - 3 sets - 10 reps - 5" hold - Supine Lower Trunk Rotation  - 1 x daily - 7 x weekly - 3 sets - 5 reps - 10" hold - Hooklying Single Knee to Chest Stretch  - 1 x daily - 7 x weekly - 3 sets - 3 reps - 30" hold - Hooklying Hamstring Stretch with Strap  - 1 x daily - 7 x weekly - 3 sets - 10 reps  ASSESSMENT:  CLINICAL IMPRESSION: Progress note for medicare today; improved FOTO score; performed a 2 MWT today; continues with L hip weakness; needs a THA but otherwise showing good progress.  Good improvement with sit to stand test today; 20.32 sec goal 20 sec.  Patient will benefit from continued skilled therapy services to address deficits and promote return to optimal function.     OBJECTIVE IMPAIRMENTS: Abnormal gait, decreased activity tolerance, decreased endurance, decreased mobility, difficulty walking, decreased ROM, decreased strength, hypomobility, increased fascial restrictions, impaired perceived functional ability, increased muscle spasms, impaired flexibility, and pain.   ACTIVITY LIMITATIONS: carrying, lifting, bending, sitting, standing, squatting, sleeping, stairs, transfers, bed mobility, bathing, toileting, dressing, hygiene/grooming, locomotion level, and caring for others  PARTICIPATION LIMITATIONS: meal prep, cleaning, laundry, driving, shopping, and community activity  REHAB POTENTIAL: Good  CLINICAL DECISION MAKING: Stable/uncomplicated  EVALUATION COMPLEXITY: Low   GOALS: Goals reviewed with patient? No  SHORT TERM GOALS: Target date: 09/01/2022  Patient will be independent with initial HEP  Baseline: Goal status: MET  2.  Patient will demonstrate good sitting posture to be able to sit x 15' to ride in a car for community activities without pain > 3/10 in the back.  Baseline: more aware of sitting posture Goal status: IN PROGRESS   LONG TERM GOALS: Target date:  09/15/2022  Patient will be independent in self management strategies to improve quality of life and functional outcomes.  Baseline:  Goal status: IN PROGRESS  2.  Patient will improve FOTO score to predicted value (42) Baseline: 24; 10/15/22 20; 11/11/22 30 (goal 42) Goal status: IN PROGRESS  3.   Patient will report at least 50% improvement in overall symptoms and/or function to demonstrate improved functional mobility   Baseline: 10/15/22 "20%" better; 11/11/22 "35-40%" better Goal status: IN PROGRESS  4.  Patient will improve 5 times sit to stand score from 29 sec to 20 sec to demonstrate improved functional mobility and increased lower extremity strength.  Baseline: 09/17/22 24.4 sec; 10/15/22 23.63 no UE assist; 11/11/22 20.32 sec Goal status: IN PROGRESS  5.  Patient will increase lower extremity MMTs to 4+- 5/5 without pain to promote return to ambulation community distances with minimal deviation.  Baseline: see above; 10/15/22 met except left hip extension (needs left THA) Goal status: IN PROGRESS  PLAN:  PT FREQUENCY: 2x/week an additional 4 weeks for a total of 16 visits  PT DURATION: 4 weeks  Therapeutic exercises, Therapeutic activity, Neuromuscular re-education, Balance training, Gait training, Patient/Family education, Joint manipulation, Joint mobilization, Stair training, Orthotic/Fit training, DME instructions, Aquatic Therapy, Dry Needling, Electrical stimulation, Spinal manipulation, Spinal mobilization, Cryotherapy, Moist heat, Compression bandaging, scar mobilization, Splintting, Taping, Traction, Ultrasound, Ionotophoresis 19m/ml Dexamethasone, and Manual therapy   PLAN FOR NEXT SESSION: progress to standing  lumbar mobility and core/ postural strength as able.  Patient needs left THA  2:39 PM, 11/11/22 Antoinett Dorman Small Miyu Fenderson MPT [facility] [place] [phone]

## 2022-11-13 ENCOUNTER — Encounter (HOSPITAL_COMMUNITY): Payer: Medicare HMO | Admitting: Physical Therapy

## 2022-11-17 ENCOUNTER — Encounter (HOSPITAL_COMMUNITY): Payer: Medicare HMO

## 2022-11-18 ENCOUNTER — Telehealth (HOSPITAL_COMMUNITY): Payer: Self-pay

## 2022-11-18 NOTE — Telephone Encounter (Signed)
Left message on voicemail for patient regarding missed appointment yesterday 2/19 and informed of upcoming appointment on Thurs 2/22  8:33 AM, 11/18/22 Inge Waldroup Small Sterling Mondo MPT [facility] [place] [phone]

## 2022-11-20 ENCOUNTER — Encounter (HOSPITAL_COMMUNITY): Payer: Medicare HMO

## 2022-11-24 ENCOUNTER — Ambulatory Visit (HOSPITAL_COMMUNITY): Payer: Medicare HMO

## 2022-11-24 DIAGNOSIS — M545 Low back pain, unspecified: Secondary | ICD-10-CM | POA: Diagnosis not present

## 2022-11-24 DIAGNOSIS — R262 Difficulty in walking, not elsewhere classified: Secondary | ICD-10-CM

## 2022-11-24 DIAGNOSIS — M546 Pain in thoracic spine: Secondary | ICD-10-CM | POA: Diagnosis not present

## 2022-11-24 DIAGNOSIS — M6281 Muscle weakness (generalized): Secondary | ICD-10-CM

## 2022-11-24 NOTE — Therapy (Signed)
OUTPATIENT PHYSICAL THERAPY THORACOLUMBAR TREATMENT   Patient Name: Shannon [name] MRN: [id number] DOB:04-18-1966, 57 y.o., female Today's Date: 11/24/2022  END OF SESSION: END OF SESSION:   PT End of Session - 11/24/22 1302     Visit Number 11    Number of Visits 20    Date for PT Re-Evaluation 12/22/22    Authorization Type [employer] H[employer]    Authorization Time Period 8 visits approved 1/25-2/25    Progress Note Due on Visit 8    PT Start Time 1300    PT Stop Time 1340    PT Time Calculation (min) 40 min    Activity Tolerance Patient tolerated treatment well    Behavior During Therapy WFL for tasks assessed/performed                      Past Medical History:  Diagnosis Date   Anxiety    Arthritis    oa needs hip replacement on right   Asthma    allergies    Cancer ([place])    squamous cell areas removed from vulva and pre caner areas removed from leg    Carotid artery occlusion    Complication of anesthesia    major depression after general anesthesia   Coronary artery disease    LHC 11/25/12 90% stenosis mid LCx & otherwise nonobstructive dz w/ EF 60-65% S/p PTCA/DES to LCx   Depression    Dyspnea    with exertion    Elevated cholesterol    Exertional dyspnea    chronic   Fibromyalgia    GERD (gastroesophageal reflux disease)    History of cardiovascular stress test 06/2015   low risk   HPV in female    Hyperlipidemia    Hypertension    Liver abscess 09/09/2021   MI (myocardial infarction) ([place]) fe 17, 2014   Obesity    Pneumonia    hx of x 2 years ago    Polycystic ovary disease    Skin tear of upper arm without complication, left, sequela    small skin tear left upper arm no drainage for 1 week   Sleep apnea    mild no cpap    Tobacco abuse    Past Surgical History:  Procedure Laterality Date   CORONARY ANGIOPLASTY WITH STENT PLACEMENT     LEFT HEART CATH AND CORONARY ANGIOGRAPHY N/A 08/09/2018   Procedure: LEFT HEART CATH AND  CORONARY ANGIOGRAPHY;  Surgeon: Nelva Bush, MD;  Location: [facility];  Service: Cardiovascular;  Laterality: N/A;   LEFT HEART CATHETERIZATION WITH CORONARY ANGIOGRAM N/A 11/15/2012   Procedure: LEFT HEART CATHETERIZATION WITH CORONARY ANGIOGRAM;  Surgeon: Thayer Headings, MD;  Location: [facility] CATH LAB;  Service: Cardiovascular;  Laterality: N/A;   PERCUTANEOUS CORONARY STENT INTERVENTION (PCI-S)  11/15/2012   Procedure: PERCUTANEOUS CORONARY STENT INTERVENTION (PCI-S);  Surgeon: Thayer Headings, MD;  Location: [facility] CATH LAB;  Service: Cardiovascular;;   skin cancer removed right lower leg Right    TONSILLECTOMY     TOTAL HIP ARTHROPLASTY Right 01/16/2021   Procedure: TOTAL HIP ARTHROPLASTY ANTERIOR APPROACH;  Surgeon: Gaynelle Arabian, MD;  Location: [facility];  Service: Orthopedics;  Laterality: Right;  177mn   uterus ablation     VULVECTOMY N/A 11/08/2019   Procedure: excision of VULVA, vulvoscopy;  Surgeon: GDian Queen MD;  Location: [facility]  Service: Gynecology;  Laterality: N/A;   Patient Active Problem List   Diagnosis Date Noted  Liver abscess 09/09/2021   DVT (deep venous thrombosis) ([place]) 09/03/2021   Bacteremia due to Klebsiella pneumoniae 08/18/2021   Chest pain 08/18/2021   Atypical chest pain 08/17/2021   Hypotension 08/17/2021   Thrombocytopenia ([facility]) 08/17/2021   Hyperglycemia 08/17/2021   Hyponatremia 08/17/2021   Hypokalemia 08/17/2021   Elevated troponin 08/17/2021   Obesity (BMI 30-39.9) 08/17/2021   Asthma 08/17/2021   Anxiety 08/17/2021   Primary osteoarthritis of right hip 01/16/2021   Chronic diastolic HF (heart failure) ([place]) 08/10/2018   GERD (gastroesophageal reflux disease) 08/09/2018   Unstable angina ([place]) 08/09/2018   Carotid artery disease ([place]) 07/24/2017   AAA (abdominal aortic aneurysm) without rupture ([place]) 01/05/2013   Other malaise and fatigue 01/05/2013   Palpitations 01/05/2013   Tobacco use disorder 12/02/2012    Coronary artery disease    Mixed hyperlipidemia    Essential hypertension    SHORTNESS OF BREATH 09/04/2009   CHEST PAIN 09/04/2009    PCP: Ammie Dalton, PA-C  REFERRING PROVIDER: Ammie Dalton, PA-C  REFERRING DIAG: PT eval/tx for chronic back pain and lower extremity weakness per Ammie Dalton, PA  Rationale for Evaluation and Treatment: Rehabilitation  THERAPY DIAG:  Low back pain, unspecified back pain laterality, unspecified chronicity, unspecified whether sciatica present  Muscle weakness (generalized)  Pain in thoracic spine  Difficulty in walking, not elsewhere classified  ONSET DATE: chronic, long time   SUBJECTIVE:                                                                                                                                                                                           SUBJECTIVE STATEMENT:  Has had a lot going on; family member with a sickness; abscess and having to take care of them. Had to do a lot of extra walking and that with discontinuing prednisone combined led to significant increase in back pain.     PERTINENT HISTORY:  Right THA 12/2020; need left one done  PAIN:  Are you having pain? Yes: NPRS scale: 8-9/10 Pain location: mid and low back Pain description: soreness Aggravating factors: sitting for a long time, prolonged walking Relieving factors: lying down  PRECAUTIONS: None  WEIGHT BEARING RESTRICTIONS: No  FALLS:  Has patient fallen in last 6 months? Yes. Number of falls 1; needed A to get up  LIVING ENVIRONMENT: Lives with: lives with their spouse Lives in: House/apartment Stairs: Yes: Internal: 10-12 steps; on right going up and External: 1-2 steps; on right going up, on left going up, and can reach both Has following equipment at home: Single point cane and Walker - 2 wheeled  OCCUPATION: not currently working;  on disability  PLOF: Independent  PATIENT GOALS: want to be able to function  without so much pain  NEXT MD VISIT:   OBJECTIVE:   DIAGNOSTIC FINDINGS:  None recent  PATIENT SURVEYS:  FOTO 26  SCOGNITION: Overall cognitive status: Within functional limits for tasks assessed     SENSATION: Numbness right thigh  POSTURE: decreased lumbar lordosis and increased thoracic kyphosis  PALPATION: Patient reports tightness and tenderness mid back to low back paraspinals  LUMBAR ROM:   AROM eval 09/17/22 10/15/2022 11/24/2022  Flexion 55% *available 65% available* pullin 70%  Available "Pulling"   Extension 25% *availble 25% available* most painful 30% Available painful   Right lateral flexion To 3" above knee  To 2" above knee   Left lateral flexion To 1" above knee  To 1" Above knee   Right rotation      Left rotation       (Blank rows = not tested)   LOWER EXTREMITY MMT:    MMT Right eval Left eval Right 09/17/22 Left 09/17/22 Right 10/15/2022 Left 10/15/2022 Right 11/24/2022 Left 11/24/2022  Hip flexion 5 3-* 5 4- 5 4+    Hip extension 4+ 3- 3- 4+ 4+ 3- 4 4+  Hip abduction          Hip adduction          Hip internal rotation          Hip external rotation          Knee flexion   4- 4+ 4+ 4+ 5 5  Knee extension 5 4+ 5 4+ 5 5    Ankle dorsiflexion '5 5 5 5 5 5    '$ Ankle plantarflexion          Ankle inversion          Ankle eversion           (Blank rows = not tested)    FUNCTIONAL TESTS:  5 times sit to stand: 29 sec  GAIT: Distance walked: 70 ft plus one flight of steps Assistive device utilized: None Level of assistance: Modified independence Comments: slower than normal gait speed  TODAY'S TREATMENT:                                                                                                                              DATE:  11/24/22 Progress note due to insurance authorization 5 times sit to stand 23.58 sec 2 MWT 323 ft FOTO 28.2 MMT's Moist heat in prone x 5' (while answering FOTO) Manual STM to decrease pain  and increase soft tissue mobility x 8'  11/11/22 Progress note today 5 times sit to stand 20.32 sec 2 MWT 321 ft FOTO 30  Supine: Hamstring stretch with towel 5 x 20" Ab set 5" x 10 Bridge 5" x 10   10/23/22 Supine: On moist heat with feet elevated on wedge Ab set 5" hold x 12 Hamstring stretch with a towel  5 x 20" Supine hooklying: LTR x 12  Bridge x 12 (reminder to perform ab set prior) Hip abduction with RTB x 10  Standing: RTB scapular retraction x 10    10/22/22 Prone: Glut set x 10 Heel squeeze x 10  Single leg raise x 10 B shoulder extension x 10  Forearm plank 5" x 5  4' manual with no mm spasms noted but + tight tissue. Side lying: Clam x 10 Hip abduction x 10  Supine: Bridge x 15 LTR x 5   10/15/2022 Progress note 5 times sit to stand 23.63 sec FOTO 20  Supine: On moist heat with feet elevated on wedge Abdominial bracing 5" hold x 10 Hamstring stretch with towel 5 x 20" each leg  Prone: Prone on elbows x 2' STM to thoracic and lumbar spine x 5' to decrease pain and increase soft tissue mobility   10/08/22: STS 10x  Standing lumbar extension 10x  Prone: POE Prone press-up 5x 10" 2 sets  Prone hip extension 10 BLE  Manual STM in prone with focus on thoracic/lumbar paraspinals and QL.  3D hip excursion 10x  Seated row GTB Standing shoulder extension GTB 10x   10/03/22 Supine: MHP during exercise Bridge 10x Marching 15x 5" with ab set Hamstring stretch 3x 30" initial with towel then with rope LTR 5x 10" no reports of pain either direction, improved mobility.  POE x 2 min Manual STM in prone with focus on thoracic/lumbar paraspinals and QL.  10/01/22 Supine: MHP with IFS 7, increased to 8.5 Volts CV x 10 min Educated use and ability to Special educational needs teacher with ab set 15 x 5" SKTC 3x 20" towel assistance Decompression RTB 1-4 5 reps LTR 5x 10" (pain with rotation to Rt, pulling in Lt hip)  09/17/22 Progress note 5 x sit  to stand 24.4 sec FOTO 24 MMT's and AROM  Supine: LTR x 10 (pulling in left hip) Abdominal bracing 5" hold x 10 Decompression exercise head press, shoulder press, leg press, leg lengthener  5" hold x 10 Bridge x 8  Seated: Scapular retractions x 10 Cervical retraction with extension x 10   09/05/22: Reviewed goals and importance of HEP compliance for maximal benefits Supine: Scapular retraction -HEP Transverse abdominis paired with exhale 10x 5"- HEP Decompression 2-5 5x 5" Prone: Reviewed benefits with prone position- HEP Prone on elbow x 1 min Glut set 10x 5" Heel squeeze 5x 5" Importance of seated posture Seated scapular retraction    08/18/22 Physical therapy evaluation and HEP instruction    Education details: Patient educated on exam findings, POC, scope of PT, HEP, and good sitting posture education. Person educated: Patient Education method: Explanation, Demonstration, and Handouts Education comprehension: verbalized understanding, returned demonstration, verbal cues required, and tactile cues required   HOME EXERCISE PROGRAM: Access Code: [email or web address] URL: [email or web address] Date: 08/18/2022 Prepared by: [facility] - Rehab  Exercises - Lying Prone  - 2 x daily - 7 x weekly - 1 sets - 1 reps - 2-5 min hold - Prone Gluteal Sets  - 2 x daily - 7 x weekly - 1 sets - 10 reps - 5 sec hold - Supine Scapular Retraction  - 2 x daily - 7 x weekly - 1 sets - 10 reps - Supine Transversus Abdominis Bracing - Hands on Stomach  - 1 x daily - 7 x weekly - 3 sets - 10 reps  Access Code: [email or web address] URL: [email or web address] Date: 09/05/2022 Prepared by: Ihor Austin  Exercises - Lying  Prone  - 2 x daily - 7 x weekly - 1 sets - 1 reps - 2-5 min hold - Prone Gluteal Sets  - 2 x daily - 7 x weekly - 1 sets - 10 reps - 5 sec hold - Supine Scapular Retraction  - 2 x daily - 7 x weekly - 1 sets - 10 reps - Supine Transversus Abdominis Bracing -  Hands on Stomach  - 1 x daily - 7 x weekly - 3 sets - 10 reps - Prone on Elbows Stretch  - 2 x daily - 7 x weekly - 1 sets - 1 reps - 2-5' hold - Correct Seated Posture  - 1 x daily - 7 x weekly - 3 sets - 10 reps - Seated Scapular Retraction  - 1 x daily - 7 x weekly - 2 sets - 10 reps - 5" hold  10/03/22: - Supine March  - 1 x daily - 7 x weekly - 2 sets - 10 reps - 5" hold - Supine Bridge  - 1 x daily - 7 x weekly - 3 sets - 10 reps - 5" hold - Supine Lower Trunk Rotation  - 1 x daily - 7 x weekly - 3 sets - 5 reps - 10" hold - Hooklying Single Knee to Chest Stretch  - 1 x daily - 7 x weekly - 3 sets - 3 reps - 30" hold - Hooklying Hamstring Stretch with Strap  - 1 x daily - 7 x weekly - 3 sets - 10 reps  ASSESSMENT:  CLINICAL IMPRESSION: Progress note for insurance today; patient with increased subjective compliant of pain due to having to assist a sick family member and doing more physically.  She does show increased strength but has noted increased antalgic gait and increased grimace with sit to stand today.  Patient will benefit from continued skilled therapy services to address deficits and promote return to optimal function.     OBJECTIVE IMPAIRMENTS: Abnormal gait, decreased activity tolerance, decreased endurance, decreased mobility, difficulty walking, decreased ROM, decreased strength, hypomobility, increased fascial restrictions, impaired perceived functional ability, increased muscle spasms, impaired flexibility, and pain.   ACTIVITY LIMITATIONS: carrying, lifting, bending, sitting, standing, squatting, sleeping, stairs, transfers, bed mobility, bathing, toileting, dressing, hygiene/grooming, locomotion level, and caring for others  PARTICIPATION LIMITATIONS: meal prep, cleaning, laundry, driving, shopping, and community activity  REHAB POTENTIAL: Good  CLINICAL DECISION MAKING: Stable/uncomplicated  EVALUATION COMPLEXITY: Low   GOALS: Goals reviewed with patient?  No  SHORT TERM GOALS: Target date: 09/01/2022  Patient will be independent with initial HEP  Baseline: Goal status: MET  2.  Patient will demonstrate good sitting posture to be able to sit x 15' to ride in a car for community activities without pain > 3/10 in the back.  Baseline: more aware of sitting posture Goal status: IN PROGRESS   LONG TERM GOALS: Target date: 09/15/2022  Patient will be independent in self management strategies to improve quality of life and functional outcomes.  Baseline:  Goal status: IN PROGRESS  2.  Patient will improve FOTO score to predicted value (42) Baseline: 24; 10/15/22 20; 11/11/22 30 (goal 42) Goal status: IN PROGRESS  3.   Patient will report at least 50% improvement in overall symptoms and/or function to demonstrate improved functional mobility   Baseline: 10/15/22 "20%" better; 11/11/22 "35-40%" better Goal status: IN PROGRESS  4.  Patient will improve 5 times sit to stand score from 29 sec to 20 sec to demonstrate improved  functional mobility and increased lower extremity strength.  Baseline: 09/17/22 24.4 sec; 10/15/22 23.63 no UE assist; 11/11/22 20.32 sec Goal status: IN PROGRESS  5.  Patient will increase lower extremity MMTs to 4+- 5/5 without pain to promote return to ambulation community distances with minimal deviation.  Baseline: see above; 10/15/22 met except left hip extension (needs left THA) Goal status: IN PROGRESS  PLAN:  PT FREQUENCY: 2x/week an additional 4 weeks for a total of 16 visits   PT DURATION: 4 weeks  Therapeutic exercises, Therapeutic activity, Neuromuscular re-education, Balance training, Gait training, Patient/Family education, Joint manipulation, Joint mobilization, Stair training, Orthotic/Fit training, DME instructions, Aquatic Therapy, Dry Needling, Electrical stimulation, Spinal manipulation, Spinal mobilization, Cryotherapy, Moist heat, Compression bandaging, scar mobilization, Splintting, Taping,  Traction, Ultrasound, Ionotophoresis '4mg'$ /ml Dexamethasone, and Manual therapy   PLAN FOR NEXT SESSION: progress to standing  lumbar mobility and core/ postural strength as able.  Patient needs left THA  2:17 PM, 11/24/22 [place] Vincent Ehrler MPT [place] [facility] [place] [phone]

## 2022-11-26 ENCOUNTER — Encounter (HOSPITAL_COMMUNITY): Payer: Medicare HMO

## 2022-12-04 ENCOUNTER — Ambulatory Visit (HOSPITAL_COMMUNITY): Payer: Medicare HMO | Attending: Family Medicine

## 2022-12-04 DIAGNOSIS — M545 Low back pain, unspecified: Secondary | ICD-10-CM | POA: Diagnosis not present

## 2022-12-04 DIAGNOSIS — M6281 Muscle weakness (generalized): Secondary | ICD-10-CM | POA: Insufficient documentation

## 2022-12-04 DIAGNOSIS — M546 Pain in thoracic spine: Secondary | ICD-10-CM | POA: Diagnosis not present

## 2022-12-04 DIAGNOSIS — R262 Difficulty in walking, not elsewhere classified: Secondary | ICD-10-CM | POA: Diagnosis not present

## 2022-12-04 NOTE — Therapy (Signed)
OUTPATIENT PHYSICAL THERAPY THORACOLUMBAR TREATMENT   Patient Name: UNKOWN TEDFORD MRN: [id number] DOB:11-14-1965, 57 y.o., female Today's Date: 12/04/2022  END OF SESSION: END OF SESSION:   PT End of Session - 12/04/22 1353     Visit Number 12    Number of Visits 20    Date for PT Re-Evaluation 12/22/22    Authorization Type [employer] H[employer]    Authorization Time Period 6 visits approved 11/26/2022 to 12/22/2022    Progress Note Due on Visit 6    PT Start Time 1350    PT Stop Time 1430    PT Time Calculation (min) 40 min    Activity Tolerance Patient tolerated treatment well    Behavior During Therapy [facility] for tasks assessed/performed                      Past Medical History:  Diagnosis Date   Anxiety    Arthritis    oa needs hip replacement on right   Asthma    allergies    Cancer ([place])    squamous cell areas removed from vulva and pre caner areas removed from leg    Carotid artery occlusion    Complication of anesthesia    major depression after general anesthesia   Coronary artery disease    LHC 11/25/12 90% stenosis mid LCx & otherwise nonobstructive dz w/ EF 60-65% S/p PTCA/DES to LCx   Depression    Dyspnea    with exertion    Elevated cholesterol    Exertional dyspnea    chronic   Fibromyalgia    GERD (gastroesophageal reflux disease)    History of cardiovascular stress test 06/2015   low risk   HPV in female    Hyperlipidemia    Hypertension    Liver abscess 09/09/2021   MI (myocardial infarction) ([place]) fe 17, 2014   Obesity    Pneumonia    hx of x 2 years ago    Polycystic ovary disease    Skin tear of upper arm without complication, left, sequela    small skin tear left upper arm no drainage for 1 week   Sleep apnea    mild no cpap    Tobacco abuse    Past Surgical History:  Procedure Laterality Date   CORONARY ANGIOPLASTY WITH STENT PLACEMENT     LEFT HEART CATH AND CORONARY ANGIOGRAPHY N/A 08/09/2018   Procedure: LEFT HEART  CATH AND CORONARY ANGIOGRAPHY;  Surgeon: Nelva Bush, MD;  Location: [place] [facility];  Service: Cardiovascular;  Laterality: N/A;   LEFT HEART CATHETERIZATION WITH CORONARY ANGIOGRAM N/A 11/15/2012   Procedure: LEFT HEART CATHETERIZATION WITH CORONARY ANGIOGRAM;  Surgeon: Thayer Headings, MD;  Location: [facility] CATH LAB;  Service: Cardiovascular;  Laterality: N/A;   PERCUTANEOUS CORONARY STENT INTERVENTION (PCI-S)  11/15/2012   Procedure: PERCUTANEOUS CORONARY STENT INTERVENTION (PCI-S);  Surgeon: Thayer Headings, MD;  Location: [facility] CATH LAB;  Service: Cardiovascular;;   skin cancer removed right lower leg Right    TONSILLECTOMY     TOTAL HIP ARTHROPLASTY Right 01/16/2021   Procedure: TOTAL HIP ARTHROPLASTY ANTERIOR APPROACH;  Surgeon: Gaynelle Arabian, MD;  Location: [facility];  Service: Orthopedics;  Laterality: Right;  175mn   uterus ablation     VULVECTOMY N/A 11/08/2019   Procedure: excision of VULVA, vulvoscopy;  Surgeon: GDian Queen MD;  [place] [facility]  Service: Gynecology;  Laterality: N/A;   Patient Active Problem List   Diagnosis  Date Noted   Liver abscess 09/09/2021   DVT (deep venous thrombosis) ([place]) 09/03/2021   Bacteremia due to Klebsiella pneumoniae 08/18/2021   Chest pain 08/18/2021   Atypical chest pain 08/17/2021   Hypotension 08/17/2021   Thrombocytopenia ([facility]) 08/17/2021   Hyperglycemia 08/17/2021   Hyponatremia 08/17/2021   Hypokalemia 08/17/2021   Elevated troponin 08/17/2021   Obesity (BMI 30-39.9) 08/17/2021   Asthma 08/17/2021   Anxiety 08/17/2021   Primary osteoarthritis of right hip 01/16/2021   Chronic diastolic HF (heart failure) ([place]) 08/10/2018   GERD (gastroesophageal reflux disease) 08/09/2018   Unstable angina ([place]) 08/09/2018   Carotid artery disease ([place]) 07/24/2017   AAA (abdominal aortic aneurysm) without rupture ([place]) 01/05/2013   Other malaise and fatigue 01/05/2013   Palpitations 01/05/2013   Tobacco use disorder  12/02/2012   Coronary artery disease    Mixed hyperlipidemia    Essential hypertension    SHORTNESS OF BREATH 09/04/2009   CHEST PAIN 09/04/2009    PCP: Ammie Dalton, PA-C  REFERRING PROVIDER: Ammie Dalton, PA-C  REFERRING DIAG: PT eval/tx for chronic back pain and lower extremity weakness per Ammie Dalton, PA  Rationale for Evaluation and Treatment: Rehabilitation  THERAPY DIAG:  Low back pain, unspecified back pain laterality, unspecified chronicity, unspecified whether sciatica present  Muscle weakness (generalized)  Pain in thoracic spine  Difficulty in walking, not elsewhere classified  ONSET DATE: chronic, long time   SUBJECTIVE:                                                                                                                                                                                           SUBJECTIVE STATEMENT:  Has an appointment Monday at [place] in the afternoon; 7/10; feels like she has not had much improvement lately since discontinuing the prednisone.  Is trying to do her exercises at home and they do seem to help her loosen up and move.   PERTINENT HISTORY:  Right THA 12/2020; need left one done  PAIN:  Are you having pain? Yes: NPRS scale: 8-9/10 Pain location: mid and low back Pain description: soreness Aggravating factors: sitting for a long time, prolonged walking Relieving factors: lying down  PRECAUTIONS: None  WEIGHT BEARING RESTRICTIONS: No  FALLS:  Has patient fallen in last 6 months? Yes. Number of falls 1; needed A to get up  LIVING ENVIRONMENT: Lives with: lives with their spouse Lives in: House/apartment Stairs: Yes: Internal: 10-12 steps; on right going up and External: 1-2 steps; on right going up, on left going up, and can reach both Has following equipment at home: Single point cane and Walker - 2  wheeled  OCCUPATION: not currently working; on disability  PLOF: Independent  PATIENT GOALS: want  to be able to function without so much pain  NEXT MD VISIT:   OBJECTIVE:   DIAGNOSTIC FINDINGS:  None recent  PATIENT SURVEYS:  FOTO 39  SCOGNITION: Overall cognitive status: Within functional limits for tasks assessed     SENSATION: Numbness right thigh  POSTURE: decreased lumbar lordosis and increased thoracic kyphosis  PALPATION: Patient reports tightness and tenderness mid back to low back paraspinals  LUMBAR ROM:   AROM eval 09/17/22 10/15/2022 11/24/2022  Flexion 55% *available 65% available* pullin 70%  Available "Pulling"   Extension 25% *availble 25% available* most painful 30% Available painful   Right lateral flexion To 3" above knee  To 2" above knee   Left lateral flexion To 1" above knee  To 1" Above knee   Right rotation      Left rotation       (Blank rows = not tested)   LOWER EXTREMITY MMT:    MMT Right eval Left eval Right 09/17/22 Left 09/17/22 Right 10/15/2022 Left 10/15/2022 Right 11/24/2022 Left 11/24/2022  Hip flexion 5 3-* 5 4- 5 4+    Hip extension 4+ 3- 3- 4+ 4+ 3- 4 4+  Hip abduction          Hip adduction          Hip internal rotation          Hip external rotation          Knee flexion   4- 4+ 4+ 4+ 5 5  Knee extension 5 4+ 5 4+ 5 5    Ankle dorsiflexion '5 5 5 5 5 5    '$ Ankle plantarflexion          Ankle inversion          Ankle eversion           (Blank rows = not tested)    FUNCTIONAL TESTS:  5 times sit to stand: 29 sec  GAIT: Distance walked: 70 ft plus one flight of steps Assistive device utilized: None Level of assistance: Modified independence Comments: slower than normal gait speed  TODAY'S TREATMENT:                                                                                                                              DATE:  12/04/22 Supine:  Moist heat x 5' to decrease pain and increase tissue extensibility SKTC 5 x 20" each Abdominal bracing 5" x 10 Bridge x 10 LTR x  10  Sitting: Scapular retraction 2 x 10  11/24/22 Progress note due to insurance authorization 5 times sit to stand 23.58 sec 2 MWT 323 ft FOTO 28.2 MMT's Moist heat in prone x 5' (while answering FOTO) Manual STM to decrease pain and increase soft tissue mobility x 8'  11/11/22 Progress note today 5 times sit to stand 20.32 sec 2 MWT 321 ft FOTO 30  Supine: Hamstring stretch with towel 5 x 20" Ab set 5" x 10 Bridge 5" x 10   10/23/22 Supine: On moist heat with feet elevated on wedge Ab set 5" hold x 12 Hamstring stretch with a towel 5 x 20" Supine hooklying: LTR x 12  Bridge x 12 (reminder to perform ab set prior) Hip abduction with RTB x 10  Standing: RTB scapular retraction x 10    10/22/22 Prone: Glut set x 10 Heel squeeze x 10  Single leg raise x 10 B shoulder extension x 10  Forearm plank 5" x 5  4' manual with no mm spasms noted but + tight tissue. Side lying: Clam x 10 Hip abduction x 10  Supine: Bridge x 15 LTR x 5   10/15/2022 Progress note 5 times sit to stand 23.63 sec FOTO 20  Supine: On moist heat with feet elevated on wedge Abdominial bracing 5" hold x 10 Hamstring stretch with towel 5 x 20" each leg  Prone: Prone on elbows x 2' STM to thoracic and lumbar spine x 5' to decrease pain and increase soft tissue mobility   10/08/22: STS 10x  Standing lumbar extension 10x  Prone: POE Prone press-up 5x 10" 2 sets  Prone hip extension 10 BLE  Manual STM in prone with focus on thoracic/lumbar paraspinals and QL.  3D hip excursion 10x  Seated row GTB Standing shoulder extension GTB 10x   10/03/22 Supine: MHP during exercise Bridge 10x Marching 15x 5" with ab set Hamstring stretch 3x 30" initial with towel then with rope LTR 5x 10" no reports of pain either direction, improved mobility.  POE x 2 min Manual STM in  prone with focus on thoracic/lumbar paraspinals and QL.  10/01/22 Supine: MHP with IFS 7, increased to 8.5 Volts CV x 10 min Educated use and ability to Special educational needs teacher with ab set 15 x 5" SKTC 3x 20" towel assistance Decompression RTB 1-4 5 reps LTR 5x 10" (pain with rotation to Rt, pulling in Lt hip)  09/17/22 Progress note 5 x sit to stand 24.4 sec FOTO 24 MMT's and AROM  Supine: LTR x 10 (pulling in left hip) Abdominal bracing 5" hold x 10 Decompression exercise head press, shoulder press, leg press, leg lengthener  5" hold x 10 Bridge x 8  Seated: Scapular retractions x 10 Cervical retraction with extension x 10   09/05/22: Reviewed goals and importance of HEP compliance for maximal benefits Supine: Scapular retraction -HEP Transverse abdominis paired with exhale 10x 5"- HEP Decompression 2-5 5x 5" Prone: Reviewed benefits with prone position- HEP Prone on elbow x 1 min Glut set 10x 5" Heel squeeze 5x 5" Importance of seated posture Seated scapular retraction    08/18/22 Physical therapy evaluation and HEP instruction    Education details: Patient educated on exam findings, POC, scope of PT, HEP, and good sitting posture education. Person educated: Patient Education method: Explanation, Demonstration, and Handouts Education comprehension: verbalized understanding, returned demonstration, verbal cues required, and tactile cues required   HOME EXERCISE PROGRAM: Access Code: [email or web address] URL: [email or web address] Date: 08/18/2022 Prepared by: [facility] - Rehab  Exercises - Lying Prone  -  2 x daily - 7 x weekly - 1 sets - 1 reps - 2-5 min hold - Prone Gluteal Sets  - 2 x daily - 7 x weekly - 1 sets - 10 reps - 5 sec hold - Supine Scapular Retraction  - 2 x daily - 7 x weekly - 1 sets - 10 reps - Supine Transversus Abdominis Bracing - Hands on Stomach  - 1 x daily - 7 x weekly - 3 sets - 10 reps  Access Code: [email or web address] URL:  [email or web address] Date: 09/05/2022 Prepared by: Ihor Austin  Exercises - Lying Prone  - 2 x daily - 7 x weekly - 1 sets - 1 reps - 2-5 min hold - Prone Gluteal Sets  - 2 x daily - 7 x weekly - 1 sets - 10 reps - 5 sec hold - Supine Scapular Retraction  - 2 x daily - 7 x weekly - 1 sets - 10 reps - Supine Transversus Abdominis Bracing - Hands on Stomach  - 1 x daily - 7 x weekly - 3 sets - 10 reps - Prone on Elbows Stretch  - 2 x daily - 7 x weekly - 1 sets - 1 reps - 2-5' hold - Correct Seated Posture  - 1 x daily - 7 x weekly - 3 sets - 10 reps - Seated Scapular Retraction  - 1 x daily - 7 x weekly - 2 sets - 10 reps - 5" hold  10/03/22: - Supine March  - 1 x daily - 7 x weekly - 2 sets - 10 reps - 5" hold - Supine Bridge  - 1 x daily - 7 x weekly - 3 sets - 10 reps - 5" hold - Supine Lower Trunk Rotation  - 1 x daily - 7 x weekly - 3 sets - 5 reps - 10" hold - Hooklying Single Knee to Chest Stretch  - 1 x daily - 7 x weekly - 3 sets - 3 reps - 30" hold - Hooklying Hamstring Stretch with Strap  - 1 x daily - 7 x weekly - 3 sets - 10 reps  ASSESSMENT:  CLINICAL IMPRESSION: Patient continues with antalgic gait and movement; reports back pain that is constant.  Today's session continued to address lumbar mobility and core strengthening.  Encouraged continued compliance with HEP.  Patient will benefit from continued skilled therapy services to address deficits and promote return to optimal function.     OBJECTIVE IMPAIRMENTS: Abnormal gait, decreased activity tolerance, decreased endurance, decreased mobility, difficulty walking, decreased ROM, decreased strength, hypomobility, increased fascial restrictions, impaired perceived functional ability, increased muscle spasms, impaired flexibility, and pain.   ACTIVITY LIMITATIONS: carrying, lifting, bending, sitting, standing, squatting, sleeping, stairs, transfers, bed mobility, bathing, toileting, dressing,  hygiene/grooming, locomotion level, and caring for others  PARTICIPATION LIMITATIONS: meal prep, cleaning, laundry, driving, shopping, and community activity  REHAB POTENTIAL: Good  CLINICAL DECISION MAKING: Stable/uncomplicated  EVALUATION COMPLEXITY: Low   GOALS: Goals reviewed with patient? No  SHORT TERM GOALS: Target date: 09/01/2022  Patient will be independent with initial HEP  Baseline: Goal status: MET  2.  Patient will demonstrate good sitting posture to be able to sit x 15' to ride in a car for community activities without pain > 3/10 in the back.  Baseline: more aware of sitting posture Goal status: IN PROGRESS   LONG TERM GOALS: Target date: 09/15/2022  Patient will be independent in self management strategies to improve quality of life and functional outcomes.  Baseline:  Goal status: IN PROGRESS  2.  Patient will improve FOTO score to predicted value (42) Baseline: 24; 10/15/22 20; 11/11/22 30 (goal 42) Goal status: IN PROGRESS  3.   Patient will report at least 50% improvement in overall symptoms and/or function to demonstrate improved functional mobility   Baseline: 10/15/22 "20%" better; 11/11/22 "35-40%" better Goal status: IN PROGRESS  4.  Patient will improve 5 times sit to stand score from 29 sec to 20 sec to demonstrate improved functional mobility and increased lower extremity strength.  Baseline: 09/17/22 24.4 sec; 10/15/22 23.63 no UE assist; 11/11/22 20.32 sec Goal status: IN PROGRESS  5.  Patient will increase lower extremity MMTs to 4+- 5/5 without pain to promote return to ambulation community distances with minimal deviation.  Baseline: see above; 10/15/22 met except left hip extension (needs left THA) Goal status: IN PROGRESS  PLAN:  PT FREQUENCY: 2x/week an additional 4 weeks for a total of 16 visits   PT DURATION: 4 weeks  Therapeutic exercises, Therapeutic activity, Neuromuscular re-education, Balance training, Gait training,  Patient/Family education, Joint manipulation, Joint mobilization, Stair training, Orthotic/Fit training, DME instructions, Aquatic Therapy, Dry Needling, Electrical stimulation, Spinal manipulation, Spinal mobilization, Cryotherapy, Moist heat, Compression bandaging, scar mobilization, Splintting, Taping, Traction, Ultrasound, Ionotophoresis '4mg'$ /ml Dexamethasone, and Manual therapy   PLAN FOR NEXT SESSION: progress to standing  lumbar mobility and core/ postural strength as able.  Patient needs left THA; add UBE; nustep  2:35 PM, 12/04/22 Shyla Gayheart Small Jaystin Mcgarvey MPT [facility] [place] [phone]

## 2022-12-11 ENCOUNTER — Ambulatory Visit (HOSPITAL_COMMUNITY): Payer: Medicare HMO

## 2022-12-11 DIAGNOSIS — R262 Difficulty in walking, not elsewhere classified: Secondary | ICD-10-CM | POA: Diagnosis not present

## 2022-12-11 DIAGNOSIS — M546 Pain in thoracic spine: Secondary | ICD-10-CM | POA: Diagnosis not present

## 2022-12-11 DIAGNOSIS — M545 Low back pain, unspecified: Secondary | ICD-10-CM

## 2022-12-11 DIAGNOSIS — M6281 Muscle weakness (generalized): Secondary | ICD-10-CM | POA: Diagnosis not present

## 2022-12-11 NOTE — Therapy (Signed)
OUTPATIENT PHYSICAL THERAPY THORACOLUMBAR TREATMENT   Patient Name: Shannon [name] MRN: [id number] DOB:November 07, 1965, 57 y.o., female Today's Date: 12/11/2022  END OF SESSION: END OF SESSION:   PT End of Session - 12/11/22 1441     Visit Number 13    Number of Visits 20    Date for PT Re-Evaluation 12/22/22    Authorization Type [employer]    Authorization Time Period 6 visits approved 11/26/2022 to 12/22/2022    Authorization - Visit Number 2    Authorization - Number of Visits 6    Progress Note Due on Visit 6    PT Start Time 1435    PT Stop Time [id number]    PT Time Calculation (min) 40 min    Activity Tolerance Patient tolerated treatment well    Behavior During Therapy [facility] for tasks assessed/performed                       Past Medical History:  Diagnosis Date   Anxiety    Arthritis    oa needs hip replacement on right   Asthma    allergies    Cancer ([place])    squamous cell areas removed from vulva and pre caner areas removed from leg    Carotid artery occlusion    Complication of anesthesia    major depression after general anesthesia   Coronary artery disease    LHC 11/25/12 90% stenosis mid LCx & otherwise nonobstructive dz w/ EF 60-65% S/p PTCA/DES to LCx   Depression    Dyspnea    with exertion    Elevated cholesterol    Exertional dyspnea    chronic   Fibromyalgia    GERD (gastroesophageal reflux disease)    History of cardiovascular stress test 06/2015   low risk   HPV in female    Hyperlipidemia    Hypertension    Liver abscess 09/09/2021   MI (myocardial infarction) ([place]) fe 17, 2014   Obesity    Pneumonia    hx of x 2 years ago    Polycystic ovary disease    Skin tear of upper arm without complication, left, sequela    small skin tear left upper arm no drainage for 1 week   Sleep apnea    mild no cpap    Tobacco abuse    Past Surgical History:  Procedure Laterality Date   CORONARY ANGIOPLASTY WITH STENT PLACEMENT     LEFT  HEART CATH AND CORONARY ANGIOGRAPHY N/A 08/09/2018   Procedure: LEFT HEART CATH AND CORONARY ANGIOGRAPHY;  Surgeon: Nelva Bush, MD;  Location: [place] [facility];  Service: Cardiovascular;  Laterality: N/A;   LEFT HEART CATHETERIZATION WITH CORONARY ANGIOGRAM N/A 11/15/2012   Procedure: LEFT HEART CATHETERIZATION WITH CORONARY ANGIOGRAM;  Surgeon: Thayer Headings, MD;  Location: [facility] CATH LAB;  Service: Cardiovascular;  Laterality: N/A;   PERCUTANEOUS CORONARY STENT INTERVENTION (PCI-S)  11/15/2012   Procedure: PERCUTANEOUS CORONARY STENT INTERVENTION (PCI-S);  Surgeon: Thayer Headings, MD;  Location: [facility] CATH LAB;  Service: Cardiovascular;;   skin cancer removed right lower leg Right    TONSILLECTOMY     TOTAL HIP ARTHROPLASTY Right 01/16/2021   Procedure: TOTAL HIP ARTHROPLASTY ANTERIOR APPROACH;  Surgeon: Gaynelle Arabian, MD;  Location: [facility];  Service: Orthopedics;  Laterality: Right;  137mn   uterus ablation     VULVECTOMY N/A 11/08/2019   Procedure: excision of VULVA, vulvoscopy;  Surgeon: GDian Queen MD;  [facility];  Service: Gynecology;  Laterality: N/A;   Patient Active Problem List   Diagnosis Date Noted   Liver abscess 09/09/2021   DVT (deep venous thrombosis) ([place]) 09/03/2021   Bacteremia due to Klebsiella pneumoniae 08/18/2021   Chest pain 08/18/2021   Atypical chest pain 08/17/2021   Hypotension 08/17/2021   Thrombocytopenia ([facility]) 08/17/2021   Hyperglycemia 08/17/2021   Hyponatremia 08/17/2021   Hypokalemia 08/17/2021   Elevated troponin 08/17/2021   Obesity (BMI 30-39.9) 08/17/2021   Asthma 08/17/2021   Anxiety 08/17/2021   Primary osteoarthritis of right hip 01/16/2021   Chronic diastolic HF (heart failure) ([place]) 08/10/2018   GERD (gastroesophageal reflux disease) 08/09/2018   Unstable angina ([place]) 08/09/2018   Carotid artery disease ([place]) 07/24/2017   AAA (abdominal aortic aneurysm) without rupture ([place]) 01/05/2013   Other malaise  and fatigue 01/05/2013   Palpitations 01/05/2013   Tobacco use disorder 12/02/2012   Coronary artery disease    Mixed hyperlipidemia    Essential hypertension    SHORTNESS OF BREATH 09/04/2009   CHEST PAIN 09/04/2009    PCP: Ammie Dalton, PA-C  REFERRING PROVIDER: Ammie Dalton, PA-C  REFERRING DIAG: PT eval/tx for chronic back pain and lower extremity weakness per Ammie Dalton, PA  Rationale for Evaluation and Treatment: Rehabilitation  THERAPY DIAG:  Low back pain, unspecified back pain laterality, unspecified chronicity, unspecified whether sciatica present  Muscle weakness (generalized)  Pain in thoracic spine  Difficulty in walking, not elsewhere classified  ONSET DATE: chronic, long time   SUBJECTIVE:                                                                                                                                                                                           SUBJECTIVE STATEMENT:  She rescheduled her spine appt; feeling ok today; 6/10; was able to do light work around the house  PERTINENT HISTORY:  Right THA 12/2020; need left one done  PAIN:  Are you having pain? Yes: NPRS scale: 8-9/10 Pain location: mid and low back Pain description: soreness Aggravating factors: sitting for a long time, prolonged walking Relieving factors: lying down  PRECAUTIONS: None  WEIGHT BEARING RESTRICTIONS: No  FALLS:  Has patient fallen in last 6 months? Yes. Number of falls 1; needed A to get up  LIVING ENVIRONMENT: Lives with: lives with their spouse Lives in: House/apartment Stairs: Yes: Internal: 10-12 steps; on right going up and External: 1-2 steps; on right going up, on left going up, and can reach both Has following equipment at home: Single point cane and Walker - 2 wheeled  OCCUPATION: not currently working; on disability  PLOF: Independent  PATIENT GOALS: want to be able to function without so much pain  NEXT MD VISIT:    OBJECTIVE:   DIAGNOSTIC FINDINGS:  None recent  PATIENT SURVEYS:  FOTO 26  SCOGNITION: Overall cognitive status: Within functional limits for tasks assessed     SENSATION: Numbness right thigh  POSTURE: decreased lumbar lordosis and increased thoracic kyphosis  PALPATION: Patient reports tightness and tenderness mid back to low back paraspinals  LUMBAR ROM:   AROM eval 09/17/22 10/15/2022 11/24/2022  Flexion 55% *available 65% available* pullin 70%  Available "Pulling"   Extension 25% *availble 25% available* most painful 30% Available painful   Right lateral flexion To 3" above knee  To 2" above knee   Left lateral flexion To 1" above knee  To 1" Above knee   Right rotation      Left rotation       (Blank rows = not tested)   LOWER EXTREMITY MMT:    MMT Right eval Left eval Right 09/17/22 Left 09/17/22 Right 10/15/2022 Left 10/15/2022 Right 11/24/2022 Left 11/24/2022  Hip flexion 5 3-* 5 4- 5 4+    Hip extension 4+ 3- 3- 4+ 4+ 3- 4 4+  Hip abduction          Hip adduction          Hip internal rotation          Hip external rotation          Knee flexion   4- 4+ 4+ 4+ 5 5  Knee extension 5 4+ 5 4+ 5 5    Ankle dorsiflexion '5 5 5 5 5 5    '$ Ankle plantarflexion          Ankle inversion          Ankle eversion           (Blank rows = not tested)    FUNCTIONAL TESTS:  5 times sit to stand: 29 sec  GAIT: Distance walked: 70 ft plus one flight of steps Assistive device utilized: None Level of assistance: Modified independence Comments: slower than normal gait speed  TODAY'S TREATMENT:                                                                                                                              DATE:  12/11/22 UBE backwards x 3' Nustep seat 10 arms 11 x 5'   Seated: Hamstring stretch with strap 10 x 10"  Standing: Heel raises x 10 Slant board 10 x 10" Scapular retractions RTB 2 x 10 Shoulder extensions RTB 2 x  10  12/04/22 Supine:  Moist heat x 5' to decrease pain and increase tissue extensibility SKTC 5 x 20" each Abdominal bracing 5" x 10 Bridge x 10 LTR x 10  Sitting: Scapular retraction 2 x 10  11/24/22 Progress note due to insurance authorization 5 times sit to stand 23.58 sec 2 MWT 323 ft FOTO 28.2 MMT's Moist heat in prone x 5' (while answering FOTO) Manual STM to decrease pain and increase soft tissue mobility x 8'  11/11/22 Progress note today 5 times sit to stand 20.32 sec 2 MWT 321 ft FOTO 30  Supine: Hamstring stretch with towel 5 x 20" Ab set 5" x 10 Bridge 5" x 10           Education details: Patient educated on exam findings, POC, scope of PT, HEP, and good sitting posture education. Person educated: Patient Education method: Explanation, Demonstration, and Handouts Education comprehension: verbalized understanding, returned demonstration, verbal cues required, and tactile cues required   HOME EXERCISE PROGRAM: Access Code: [email or web address] URL: [email or web address] Date: 08/18/2022 Prepared by: [facility] - Rehab  Exercises - Lying Prone  - 2 x daily - 7 x weekly - 1 sets - 1 reps - 2-5 min hold - Prone Gluteal Sets  - 2 x daily - 7 x weekly - 1 sets - 10 reps - 5 sec hold - Supine Scapular Retraction  - 2 x daily - 7 x weekly - 1 sets - 10 reps - Supine Transversus Abdominis Bracing - Hands on Stomach  - 1 x daily - 7 x weekly - 3 sets - 10 reps  Access Code: [email or web address] URL: [email or web address] Date: 09/05/2022 Prepared by: Ihor Austin  Exercises - Lying Prone  - 2 x daily - 7 x weekly - 1 sets - 1 reps - 2-5 min hold - Prone Gluteal Sets  - 2 x daily - 7 x weekly - 1 sets - 10 reps - 5 sec hold - Supine Scapular Retraction  - 2 x daily - 7 x weekly - 1 sets - 10 reps - Supine Transversus Abdominis Bracing - Hands on Stomach  - 1 x daily - 7 x  weekly - 3 sets - 10 reps - Prone on Elbows Stretch  - 2 x daily - 7 x weekly - 1 sets - 1 reps - 2-5' hold - Correct Seated Posture  - 1 x daily - 7 x weekly - 3 sets - 10 reps - Seated Scapular Retraction  - 1 x daily - 7 x weekly - 2 sets - 10 reps - 5" hold  10/03/22: - Supine March  - 1 x daily - 7 x weekly - 2 sets - 10 reps - 5" hold - Supine Bridge  - 1 x daily - 7 x weekly - 3 sets - 10 reps - 5" hold - Supine Lower Trunk Rotation  - 1 x daily - 7 x weekly - 3 sets - 5 reps - 10" hold - Hooklying Single Knee to Chest Stretch  - 1 x daily - 7 x weekly - 3 sets - 3 reps - 30" hold - Hooklying Hamstring Stretch with Strap  - 1 x daily - 7 x weekly - 3 sets - 10 reps  ASSESSMENT:  CLINICAL IMPRESSION: Progressed to standing exercise today; continued to work on lumbar mobility and strength.  Added standing stretching and progressed strengthening today.   Patient will benefit from continued skilled therapy services to address deficits and promote return to optimal function.     OBJECTIVE IMPAIRMENTS: Abnormal gait, decreased activity tolerance, decreased endurance, decreased mobility, difficulty walking, decreased ROM, decreased strength, hypomobility, increased fascial restrictions, impaired perceived functional ability, increased muscle spasms, impaired flexibility, and pain.   ACTIVITY LIMITATIONS: carrying,  lifting, bending, sitting, standing, squatting, sleeping, stairs, transfers, bed mobility, bathing, toileting, dressing, hygiene/grooming, locomotion level, and caring for others  PARTICIPATION LIMITATIONS: meal prep, cleaning, laundry, driving, shopping, and community activity  REHAB POTENTIAL: Good  CLINICAL DECISION MAKING: Stable/uncomplicated  EVALUATION COMPLEXITY: Low   GOALS: Goals reviewed with patient? No  SHORT TERM GOALS: Target date: 09/01/2022  Patient will be independent with initial HEP  Baseline: Goal status: MET  2.  Patient will demonstrate good  sitting posture to be able to sit x 15' to ride in a car for community activities without pain > 3/10 in the back.  Baseline: more aware of sitting posture Goal status: IN PROGRESS   LONG TERM GOALS: Target date: 09/15/2022  Patient will be independent in self management strategies to improve quality of life and functional outcomes.  Baseline:  Goal status: IN PROGRESS  2.  Patient will improve FOTO score to predicted value (42) Baseline: 24; 10/15/22 20; 11/11/22 30 (goal 42) Goal status: IN PROGRESS  3.   Patient will report at least 50% improvement in overall symptoms and/or function to demonstrate improved functional mobility   Baseline: 10/15/22 "20%" better; 11/11/22 "35-40%" better Goal status: IN PROGRESS  4.  Patient will improve 5 times sit to stand score from 29 sec to 20 sec to demonstrate improved functional mobility and increased lower extremity strength.  Baseline: 09/17/22 24.4 sec; 10/15/22 23.63 no UE assist; 11/11/22 20.32 sec Goal status: IN PROGRESS  5.  Patient will increase lower extremity MMTs to 4+- 5/5 without pain to promote return to ambulation community distances with minimal deviation.  Baseline: see above; 10/15/22 met except left hip extension (needs left THA) Goal status: IN PROGRESS  PLAN:  PT FREQUENCY: 2x/week an additional 4 weeks for a total of 16 visits   PT DURATION: 4 weeks  Therapeutic exercises, Therapeutic activity, Neuromuscular re-education, Balance training, Gait training, Patient/Family education, Joint manipulation, Joint mobilization, Stair training, Orthotic/Fit training, DME instructions, Aquatic Therapy, Dry Needling, Electrical stimulation, Spinal manipulation, Spinal mobilization, Cryotherapy, Moist heat, Compression bandaging, scar mobilization, Splintting, Taping, Traction, Ultrasound, Ionotophoresis '4mg'$ /ml Dexamethasone, and Manual therapy   PLAN FOR NEXT SESSION: progress to standing  lumbar mobility and core/ postural  strength as able.  Patient needs left THA; add hip vectors  3:15 PM, 12/11/22 Ilaisaane Marts Small Berdena Cisek MPT [facility]  [phone]

## 2022-12-16 ENCOUNTER — Encounter (HOSPITAL_COMMUNITY): Payer: Medicare HMO

## 2022-12-18 ENCOUNTER — Ambulatory Visit (HOSPITAL_COMMUNITY): Payer: Medicare HMO | Admitting: Physical Therapy

## 2022-12-18 ENCOUNTER — Encounter (HOSPITAL_COMMUNITY): Payer: Self-pay | Admitting: Physical Therapy

## 2022-12-18 DIAGNOSIS — M545 Low back pain, unspecified: Secondary | ICD-10-CM

## 2022-12-18 DIAGNOSIS — R262 Difficulty in walking, not elsewhere classified: Secondary | ICD-10-CM

## 2022-12-18 DIAGNOSIS — M546 Pain in thoracic spine: Secondary | ICD-10-CM | POA: Diagnosis not present

## 2022-12-18 DIAGNOSIS — M6281 Muscle weakness (generalized): Secondary | ICD-10-CM | POA: Diagnosis not present

## 2022-12-18 NOTE — Therapy (Signed)
[facility] at [place], [place], [address] Phone: [phone]   Fax:  [phone]  Patient Details  Name: Shannon [name] MRN: [id number] Date of Birth: August 14, 1966 Referring Provider:  No ref. provider found  Encounter Date: 12/18/2022 PHYSICAL THERAPY DISCHARGE SUMMARY  Visits from Start of Care: 14  Current functional level related to goals / functional outcomes: PT states no change since therapy started, called and stated that she was not coming back.   Remaining deficits: Nothing was formally tested as pt called and requested D/C   Education / Equipment: HEP   Patient agrees to discharge. Patient goals were not met. Patient is being discharged due to the patient's request.   Rayetta Humphrey, PT CLT [phone]  12/18/2022, 5:04 PM  [facility] at [place], [place], [address] Phone: [phone]   Fax:  [phone]

## 2022-12-18 NOTE — Therapy (Signed)
OUTPATIENT PHYSICAL THERAPY THORACOLUMBAR TREATMENT   Patient Name: LOREE LOMIBAO MRN: [id number] DOB:[birth year], 57 y.o., female Today's Date: 12/18/2022  END OF SESSION:    PT End of Session - 12/18/22 1430    Visit Number 14    Number of Visits 20    Date for PT Re-Evaluation 12/22/22    Authorization Type [employer]    Authorization Time Period 6 visits approved 11/26/2022 to 12/22/2022    Authorization - Visit Number 3    Authorization - Number of Visits 6    Progress Note Due on Visit 6    PT Start Time 1350    PT Stop Time 1430    PT Time Calculation (min) 40 min    Activity Tolerance Patient tolerated treatment well    Behavior During Therapy [facility] for tasks assessed/performed                       Past Medical History:  Diagnosis Date   Anxiety    Arthritis    oa needs hip replacement on right   Asthma    allergies    Cancer ([place])    squamous cell areas removed from vulva and pre caner areas removed from leg    Carotid artery occlusion    Complication of anesthesia    major depression after general anesthesia   Coronary artery disease    LHC 11/25/12 90% stenosis mid LCx & otherwise nonobstructive dz w/ EF 60-65% S/p PTCA/DES to LCx   Depression    Dyspnea    with exertion    Elevated cholesterol    Exertional dyspnea    chronic   Fibromyalgia    GERD (gastroesophageal reflux disease)    History of cardiovascular stress test 06/2015   low risk   HPV in female    Hyperlipidemia    Hypertension    Liver abscess 09/09/2021   MI (myocardial infarction) ([place]) fe 17, 2014   Obesity    Pneumonia    hx of x 2 years ago    Polycystic ovary disease    Skin tear of upper arm without complication, left, sequela    small skin tear left upper arm no drainage for 1 week   Sleep apnea    mild no cpap    Tobacco abuse    Past Surgical History:  Procedure Laterality Date   CORONARY ANGIOPLASTY WITH STENT PLACEMENT     LEFT HEART CATH AND  CORONARY ANGIOGRAPHY N/A 08/09/2018   Procedure: LEFT HEART CATH AND CORONARY ANGIOGRAPHY;  Surgeon: Nelva Bush, MD;  Location: [facility];  Service: Cardiovascular;  Laterality: N/A;   LEFT HEART CATHETERIZATION WITH CORONARY ANGIOGRAM N/A 11/15/2012   Procedure: LEFT HEART CATHETERIZATION WITH CORONARY ANGIOGRAM;  Surgeon: Thayer Headings, MD;  Location[facility] C[facility];  Service: Cardiovascular;  Laterality: N/A;   PERCUTANEOUS CORONARY STENT INTERVENTION (PCI-S)  11/15/2012   Procedure: PERCUTANEOUS CORONARY STENT INTERVENTION (PCI-S);  Surgeon: Thayer Headings, MD;  Location: [facility] CATH LAB;  Service: Cardiovascular;;   skin cancer removed right lower leg Right    TONSILLECTOMY     TOTAL HIP ARTHROPLASTY Right 01/16/2021   Procedure: TOTAL HIP ARTHROPLASTY ANTERIOR APPROACH;  Surgeon: Gaynelle Arabian, MD;  Location: [facility];  Service: Orthopedics;  Laterality: Right;  173min   uterus ablation     VULVECTOMY N/A 11/08/2019   Procedure: excision of VULVA, vulvoscopy;  Surgeon: Dian Queen, MD;  Location: [facility];  Service: Gynecology;  Laterality: N/A;   Patient Active Problem List   Diagnosis Date Noted   Liver abscess 09/09/2021   DVT (deep venous thrombosis) ([place]) 09/03/2021   Bacteremia due to Klebsiella pneumoniae 08/18/2021   Chest pain 08/18/2021   Atypical chest pain 08/17/2021   Hypotension 08/17/2021   Thrombocytopenia ([facility]) 08/17/2021   Hyperglycemia 08/17/2021   Hyponatremia 08/17/2021   Hypokalemia 08/17/2021   Elevated troponin 08/17/2021   Obesity (BMI 30-39.9) 08/17/2021   Asthma 08/17/2021   Anxiety 08/17/2021   Primary osteoarthritis of right hip 01/16/2021   Chronic diastolic HF (heart failure) ([place]) 08/10/2018   GERD (gastroesophageal reflux disease) 08/09/2018   Unstable angina ([place]) 08/09/2018   Carotid artery disease ([place]) 07/24/2017   AAA (abdominal aortic aneurysm) without rupture ([place]) 01/05/2013   Other malaise and fatigue  01/05/2013   Palpitations 01/05/2013   Tobacco use disorder 12/02/2012   Coronary artery disease    Mixed hyperlipidemia    Essential hypertension    SHORTNESS OF BREATH 09/04/2009   CHEST PAIN 09/04/2009    PCP: Ammie Dalton, PA-C  REFERRING PROVIDER: Ammie Dalton, PA-C  REFERRING DIAG: PT eval/tx for chronic back pain and lower extremity weakness per Ammie Dalton, PA  Rationale for Evaluation and Treatment: Rehabilitation  THERAPY DIAG:  Low back pain, unspecified back pain laterality, unspecified chronicity, unspecified whether sciatica present  Pain in thoracic spine  Muscle weakness (generalized)  Difficulty in walking, not elsewhere classified  ONSET DATE: chronic, long time                                                                                                                                                                                             SUBJECTIVE STATEMENT:  Pt states that she is doing her home exercises.  She hurts from her mid back down to her buttock B.    PERTINENT HISTORY:  Right THA 12/2020; need left one done  PAIN:  Are you having pain? Yes: NPRS scale: 7/10 Pain location: mid and low back Pain description: soreness Aggravating factors: sitting for a long time, prolonged walking Relieving factors: lying down  PRECAUTIONS: None  WEIGHT BEARING RESTRICTIONS: No  FALLS:  Has patient fallen in last 6 months? Yes. Number of falls 1; needed A to get up  LIVING ENVIRONMENT: Lives with: lives with their spouse Lives in: House/apartment Stairs: Yes: Internal: 10-12 steps; on right going up and External: 1-2 steps; on right going up, on left going up, and can reach both Has following equipment at home: Single point cane and Walker - 2 wheeled  OCCUPATION: not currently working; on disability  PLOF: Independent  PATIENT GOALS: want to be able to function without so much pain  NEXT MD VISIT:   OBJECTIVE:   DIAGNOSTIC  FINDINGS:  None recent  PATIENT SURVEYS:  FOTO 26  SCOGNITION: Overall cognitive status: Within functional limits for tasks assessed     SENSATION: Numbness right thigh  POSTURE: decreased lumbar lordosis and increased thoracic kyphosis  PALPATION: Patient reports tightness and tenderness mid back to low back paraspinals  LUMBAR ROM:   AROM eval 09/17/22 10/15/2022 11/24/2022  Flexion 55% *available 65% available* pullin 70%  Available "Pulling"   Extension 25% *availble 25% available* most painful 30% Available painful   Right lateral flexion To 3" above knee  To 2" above knee   Left lateral flexion To 1" above knee  To 1" Above knee   Right rotation      Left rotation       (Blank rows = not tested)   LOWER EXTREMITY MMT:    MMT Right eval Left eval Right 09/17/22 Left 09/17/22 Right 10/15/2022 Left 10/15/2022 Right 11/24/2022 Left 11/24/2022  Hip flexion 5 3-* 5 4- 5 4+    Hip extension 4+ 3- 3- 4+ 4+ 3- 4 4+  Hip abduction          Hip adduction          Hip internal rotation          Hip external rotation          Knee flexion   4- 4+ 4+ 4+ 5 5  Knee extension 5 4+ 5 4+ 5 5    Ankle dorsiflexion 5 5 5 5 5 5     Ankle plantarflexion          Ankle inversion          Ankle eversion           (Blank rows = not tested)    FUNCTIONAL TESTS:  5 times sit to stand: 29 sec  GAIT: Distance walked: 70 ft plus one flight of steps Assistive device utilized: None Level of assistance: Modified independence Comments: slower than normal gait speed  TODAY'S TREATMENT:                                                                                                                              DATE:  12/18/22  Standing: Wall arch x 10 Hip excursion x 3 Sitting : Thoracic excursion x 3 Mad cat/old horse x 3  Child pose/ shift hips to RT then to left x 3 reps holding for 15" Prone: Prone on elbow x 1' Ab set with glut set  Hip extension x 10 B B  shoulder extension x 10 Scapular retraction x 10 Supine : Bridge x 10  Dead bug x 10  LTR x 10  Side lying clam :  B x 10 n  12/11/22 UBE backwards x 3' Nustep seat 10 arms 11 x 5'   Seated:  Hamstring stretch with strap 10 x 10"  Standing: Heel raises x 10 Slant board 10 x 10" Scapular retractions RTB 2 x 10 Shoulder extensions RTB 2 x 10  12/04/22 Supine:  Moist heat x 5' to decrease pain and increase tissue extensibility SKTC 5 x 20" each Abdominal bracing 5" x 10 Bridge x 10 LTR x 10  Sitting: Scapular retraction 2 x 10                                                                               11/24/22 Progress note due to insurance authorization 5 times sit to stand 23.58 sec 2 MWT 323 ft FOTO 28.2 MMT's Moist heat in prone x 5' (while answering FOTO) Manual STM to decrease pain and increase soft tissue mobility x 8'  11/11/22 Progress note today 5 times sit to stand 20.32 sec 2 MWT 321 ft FOTO 30  Supine: Hamstring stretch with towel 5 x 20" Ab set 5" x 10 Bridge 5" x 10           Education details: Patient educated on exam findings, POC, scope of PT, HEP, and good sitting posture education. Person educated: Patient Education method: Explanation, Demonstration, and Handouts Education comprehension: verbalized understanding, returned demonstration, verbal cues required, and tactile cues required   HOME EXERCISE PROGRAM: Access Code: [email or web address] URL: [email or web address] Date: 08/18/2022 Prepared by: [facility] - Rehab  Exercises - Lying Prone  - 2 x daily - 7 x weekly - 1 sets - 1 reps - 2-5 min hold - Prone Gluteal Sets  - 2 x daily - 7 x weekly - 1 sets - 10 reps - 5 sec hold - Supine Scapular Retraction  - 2 x daily - 7 x weekly - 1 sets - 10 reps - Supine Transversus Abdominis Bracing - Hands on Stomach  - 1 x daily - 7 x weekly - 3 sets - 10 reps  Access Code: [email or web address] URL: [email or web address] Date:  09/05/2022 Prepared by: Ihor Austin  Exercises - Lying Prone  - 2 x daily - 7 x weekly - 1 sets - 1 reps - 2-5 min hold - Prone Gluteal Sets  - 2 x daily - 7 x weekly - 1 sets - 10 reps - 5 sec hold - Supine Scapular Retraction  - 2 x daily - 7 x weekly - 1 sets - 10 reps - Supine Transversus Abdominis Bracing - Hands on Stomach  - 1 x daily - 7 x weekly - 3 sets - 10 reps - Prone on Elbows Stretch  - 2 x daily - 7 x weekly - 1 sets - 1 reps - 2-5' hold - Correct Seated Posture  - 1 x daily - 7 x weekly - 3 sets - 10 reps - Seated Scapular Retraction  - 1 x daily - 7 x weekly - 2 sets - 10 reps - 5" hold  10/03/22: - Supine March  - 1 x daily - 7 x weekly - 2 sets - 10 reps - 5" hold - Supine Bridge  - 1 x daily - 7 x weekly - 3 sets - 10 reps - 5" hold - Supine Lower Trunk Rotation  - 1 x  daily - 7 x weekly - 3 sets - 5 reps - 10" hold - Hooklying Single Knee to Chest Stretch  - 1 x daily - 7 x weekly - 3 sets - 3 reps - 30" hold - Hooklying Hamstring Stretch with Strap  - 1 x daily - 7 x weekly - 3 sets - 10 reps  ASSESSMENT:  CLINICAL IMPRESSION: Pt treatment focused on stretching and stability.  Most exercises completed non weight bearing due to pt high pain level.     Patient will benefit from continued skilled therapy services to address deficits and promote return to optimal function.     OBJECTIVE IMPAIRMENTS: Abnormal gait, decreased activity tolerance, decreased endurance, decreased mobility, difficulty walking, decreased ROM, decreased strength, hypomobility, increased fascial restrictions, impaired perceived functional ability, increased muscle spasms, impaired flexibility, and pain.   ACTIVITY LIMITATIONS: carrying, lifting, bending, sitting, standing, squatting, sleeping, stairs, transfers, bed mobility, bathing, toileting, dressing, hygiene/grooming, locomotion level, and caring for others  PARTICIPATION LIMITATIONS: meal prep, cleaning, laundry, driving, shopping, and  community activity  REHAB POTENTIAL: Good  CLINICAL DECISION MAKING: Stable/uncomplicated  EVALUATION COMPLEXITY: Low   GOALS: Goals reviewed with patient? No  SHORT TERM GOALS: Target date: 09/01/2022  Patient will be independent with initial HEP  Baseline: Goal status: MET  2.  Patient will demonstrate good sitting posture to be able to sit x 15' to ride in a car for community activities without pain > 3/10 in the back.  Baseline: more aware of sitting posture Goal status: IN PROGRESS   LONG TERM GOALS: Target date: 09/15/2022  Patient will be independent in self management strategies to improve quality of life and functional outcomes.  Baseline:  Goal status: IN PROGRESS  2.  Patient will improve FOTO score to predicted value (42) Baseline: 24; 10/15/22 20; 11/11/22 30 (goal 42) Goal status: IN PROGRESS  3.   Patient will report at least 50% improvement in overall symptoms and/or function to demonstrate improved functional mobility   Baseline: 10/15/22 "20%" better; 11/11/22 "35-40%" better Goal status: IN PROGRESS  4.  Patient will improve 5 times sit to stand score from 29 sec to 20 sec to demonstrate improved functional mobility and increased lower extremity strength.  Baseline: 09/17/22 24.4 sec; 10/15/22 23.63 no UE assist; 11/11/22 20.32 sec Goal status: IN PROGRESS  5.  Patient will increase lower extremity MMTs to 4+- 5/5 without pain to promote return to ambulation community distances with minimal deviation.  Baseline: see above; 10/15/22 met except left hip extension (needs left THA) Goal status: IN PROGRESS  PLAN:  PT FREQUENCY: 2x/week an additional 4 weeks for a total of 16 visits   PT DURATION: 4 weeks  Therapeutic exercises, Therapeutic activity, Neuromuscular re-education, Balance training, Gait training, Patient/Family education, Joint manipulation, Joint mobilization, Stair training, Orthotic/Fit training, DME instructions, Aquatic Therapy, Dry  Needling, Electrical stimulation, Spinal manipulation, Spinal mobilization, Cryotherapy, Moist heat, Compression bandaging, scar mobilization, Splintting, Taping, Traction, Ultrasound, Ionotophoresis 4mg /ml Dexamethasone, and Manual therapy   PLAN FOR NEXT SESSION: progress to standing  lumbar mobility and core/ postural strength as able.  Patient needs left THA; add hip vectors  Rayetta Humphrey, PT CLT [phone]

## 2022-12-22 ENCOUNTER — Encounter (HOSPITAL_COMMUNITY): Payer: Medicare HMO

## 2023-02-18 DIAGNOSIS — I6521 Occlusion and stenosis of right carotid artery: Secondary | ICD-10-CM | POA: Diagnosis not present

## 2023-02-18 DIAGNOSIS — Z1330 Encounter for screening examination for mental health and behavioral disorders, unspecified: Secondary | ICD-10-CM | POA: Diagnosis not present

## 2023-02-26 DIAGNOSIS — M5451 Vertebrogenic low back pain: Secondary | ICD-10-CM | POA: Diagnosis not present

## 2023-02-26 DIAGNOSIS — M47816 Spondylosis without myelopathy or radiculopathy, lumbar region: Secondary | ICD-10-CM | POA: Diagnosis not present

## 2023-03-03 DIAGNOSIS — D485 Neoplasm of uncertain behavior of skin: Secondary | ICD-10-CM | POA: Diagnosis not present

## 2023-03-03 DIAGNOSIS — C44722 Squamous cell carcinoma of skin of right lower limb, including hip: Secondary | ICD-10-CM | POA: Diagnosis not present

## 2023-03-03 DIAGNOSIS — I872 Venous insufficiency (chronic) (peripheral): Secondary | ICD-10-CM | POA: Diagnosis not present

## 2023-03-03 DIAGNOSIS — D0472 Carcinoma in situ of skin of left lower limb, including hip: Secondary | ICD-10-CM | POA: Diagnosis not present

## 2023-03-03 DIAGNOSIS — C44529 Squamous cell carcinoma of skin of other part of trunk: Secondary | ICD-10-CM | POA: Diagnosis not present

## 2023-03-14 ENCOUNTER — Encounter (HOSPITAL_COMMUNITY): Payer: Self-pay

## 2023-03-14 ENCOUNTER — Ambulatory Visit (HOSPITAL_COMMUNITY)
Admission: EM | Admit: 2023-03-14 | Discharge: 2023-03-14 | Disposition: A | Discharge status: Home / Self Care | Payer: Medicare HMO | Attending: Physician Assistant | Admitting: Physician Assistant

## 2023-03-14 DIAGNOSIS — C4492 Squamous cell carcinoma of skin, unspecified: Secondary | ICD-10-CM | POA: Insufficient documentation

## 2023-03-14 DIAGNOSIS — J441 Chronic obstructive pulmonary disease with (acute) exacerbation: Secondary | ICD-10-CM | POA: Diagnosis not present

## 2023-03-14 DIAGNOSIS — Z1152 Encounter for screening for COVID-19: Secondary | ICD-10-CM | POA: Diagnosis not present

## 2023-03-14 MED ORDER — AMOXICILLIN-POT CLAVULANATE 875-125 MG PO TABS: 1.0000 | ORAL_TABLET | Freq: Two times a day (BID) | ORAL | 0 refills | Status: DC

## 2023-03-14 NOTE — ED Triage Notes (Signed)
Sx started x 2 days. Worse today. Cough,headache,sore throat, ear pain.

## 2023-03-14 NOTE — Discharge Instructions (Signed)
We are testing you for COVID.  We will contact you if you are positive.  Take Augmentin to cover for COPD exacerbation.  Use over-the-counter medications as needed for additional symptom relief including Mucinex, Flonase, Tylenol.  Make sure that you rest and drink plenty of fluid.  If your symptoms or not improving within a few days please return for reevaluation.  If you have any worsening symptoms including high fever, worsening cough, shortness of breath, nausea/vomiting, weakness you need to be seen immediately.  Follow-up with your PCP.

## 2023-03-14 NOTE — ED Provider Notes (Signed)
[facility]    CSN: [id number] Arrival date & time: 03/14/23  1419      History   Chief Complaint Chief Complaint  Patient presents with   Sore Throat   Cough   Otalgia   Headache    HPI Nohemy A Knox is a 57 y.o. female.   Patient presents today with a several day history of worsening cough and URI symptoms including sore throat, otalgia, headache.  She denies any fever, chest pain, shortness of breath, nausea, vomiting.  Denies any known sick contacts.  She does have a history of allergies and asthma and has been taking an over-the-counter antihistamine as well as ibuprofen with minimal improvement of symptoms.  She reports that her symptoms were particularly bothersome so she took several doses of amoxicillin that she had leftover from a previous prescription that was effective.  She is a current everyday smoker and reports she has been to that she has COPD but does not take maintenance medication for this.  She is currently being treated for squamous cell skin cancer with excision but  is not taking any chemotherapy or immunosuppressant medication.  She denies any recent steroid use.  She has not had COVID.  She has had the initial COVID vaccines.    Past Medical History:  Diagnosis Date   Anxiety    Arthritis    oa needs hip replacement on right   Asthma    allergies    Cancer (HCC)    squamous cell areas removed from vulva and pre caner areas removed from leg    Carotid artery occlusion    Complication of anesthesia    major depression after general anesthesia   Coronary artery disease    LHC 11/25/12 90% stenosis mid LCx & otherwise nonobstructive dz w/ EF 60-65% S/p PTCA/DES to LCx   Depression    Dyspnea    with exertion    Elevated cholesterol    Exertional dyspnea    chronic   Fibromyalgia    GERD (gastroesophageal reflux disease)    History of cardiovascular stress test 06/2015   low risk   HPV in female    Hyperlipidemia    Hypertension     Liver abscess 09/09/2021   MI (myocardial infarction) (HCC) fe 17, 2014   Obesity    Pneumonia    hx of x 2 years ago    Polycystic ovary disease    Skin tear of upper arm without complication, left, sequela    small skin tear left upper arm no drainage for 1 week   Sleep apnea    mild no cpap    Tobacco abuse     Patient Active Problem List   Diagnosis Date Noted   Liver abscess 09/09/2021   DVT (deep venous thrombosis) (HCC) 09/03/2021   Bacteremia due to Klebsiella pneumoniae 08/18/2021   Chest pain 08/18/2021   Atypical chest pain 08/17/2021   Hypotension 08/17/2021   Thrombocytopenia (HCC) 08/17/2021   Hyperglycemia 08/17/2021   Hyponatremia 08/17/2021   Hypokalemia 08/17/2021   Elevated troponin 08/17/2021   Obesity (BMI 30-39.9) 08/17/2021   Asthma 08/17/2021   Anxiety 08/17/2021   Primary osteoarthritis of right hip 01/16/2021   Chronic diastolic HF (heart failure) (HCC) 08/10/2018   GERD (gastroesophageal reflux disease) 08/09/2018   Unstable angina (HCC) 08/09/2018   Carotid artery disease (HCC) 07/24/2017   AAA (abdominal aortic aneurysm) without rupture (HCC) 01/05/2013   Other malaise and fatigue 01/05/2013   Palpitations 01/05/2013  Tobacco use disorder 12/02/2012   Coronary artery disease    Mixed hyperlipidemia    Essential hypertension    SHORTNESS OF BREATH 09/04/2009   CHEST PAIN 09/04/2009    Past Surgical History:  Procedure Laterality Date   CORONARY ANGIOPLASTY WITH STENT PLACEMENT     LEFT HEART CATH AND CORONARY ANGIOGRAPHY N/A 08/09/2018   Procedure: LEFT HEART CATH AND CORONARY ANGIOGRAPHY;  Surgeon: Yvonne Kendall, MD;  Location: [facility];  Service: Cardiovascular;  Laterality: N/A;   LEFT HEART CATHETERIZATION WITH CORONARY ANGIOGRAM N/A 11/15/2012   Procedure: LEFT HEART CATHETERIZATION WITH CORONARY ANGIOGRAM;  Surgeon: Vesta Mixer, MD;  Location: [facility] CATH LAB;  Service: Cardiovascular;  Laterality: N/A;   PERCUTANEOUS  CORONARY STENT INTERVENTION (PCI-S)  11/15/2012   Procedure: PERCUTANEOUS CORONARY STENT INTERVENTION (PCI-S);  Surgeon: Vesta Mixer, MD;  Location: [facility] CATH LAB;  Service: Cardiovascular;;   skin cancer removed right lower leg Right    TONSILLECTOMY     TOTAL HIP ARTHROPLASTY Right 01/16/2021   Procedure: TOTAL HIP ARTHROPLASTY ANTERIOR APPROACH;  Surgeon: Ollen Gross, MD;  Location: [facility];  Service: Orthopedics;  Laterality: Right;    uterus ablation     VULVECTOMY N/A 11/08/2019   Procedure: excision of VULVA, vulvoscopy;  Surgeon: Marcelle Overlie, MD;  Location: [facility] [place];  Service: Gynecology;  Laterality: N/A;    OB History   No obstetric history on file.      Home Medications    Prior to Admission medications   Medication Sig Start Date End Date Taking? Authorizing Provider  albuterol (PROVENTIL HFA;VENTOLIN HFA) 108 (90 Base) MCG/ACT inhaler Inhale 2 puffs into the lungs every 4 (four) hours as needed for wheezing or shortness of breath. 12/21/15  Yes Ward, Layla Maw, DO  ALPRAZolam (XANAX) 1 MG tablet Take 1 mg by mouth 2 (two) times daily as needed for anxiety. 06/12/17  Yes [provider]  amoxicillin-clavulanate (AUGMENTIN) 875-125 MG tablet Take 1 tablet by mouth every 12 (twelve) hours. 03/14/23  Yes Anica Alcaraz, Noberto Retort, PA-C  aspirin EC 81 MG tablet Take 1 tablet (81 mg total) by mouth daily. Swallow whole. 09/05/21  Yes Meredeth Ide, MD  bisoprolol-hydrochlorothiazide (ZIAC) 5-6.25 MG tablet Take 1 tablet by mouth daily. Pt takes in the pm 12/21/19  Yes [provider]  carbamide peroxide (DEBROX) 6.5 % OTIC solution Place 5 drops into both ears 2 (two) times daily. 09/25/21  Yes Wallis Bamberg, PA-C  escitalopram (LEXAPRO) 20 MG tablet Take 20 mg by mouth at bedtime. Takes in the pm   Yes [provider]  fluticasone (FLONASE) 50 MCG/ACT nasal spray Place 2 sprays into both nostrils at bedtime.   Yes [provider]   furosemide (LASIX) 20 MG tablet Take 1 tablet (20 mg total) by mouth daily as needed. 08/20/22 08/15/23 Yes Strader, Lennart Pall, PA-C  nitroGLYCERIN (NITROSTAT) 0.4 MG SL tablet Place 0.4 mg under the tongue every 5 (five) minutes as needed for chest pain.   Yes [provider]  oxyCODONE (OXY IR/ROXICODONE) 5 MG immediate release tablet Take 1 tablet (5 mg total) by mouth every 6 (six) hours as needed for severe pain or moderate pain. 08/20/21  Yes Pokhrel, Laxman, MD  pantoprazole (PROTONIX) 40 MG tablet Take 40 mg by mouth daily.   Yes [provider]  rosuvastatin (CRESTOR) 10 MG tablet Take 10 mg by mouth at bedtime.   Yes [provider]  triamcinolone cream (KENALOG) 0.1 %  Apply 1 application topically 2 (two) times daily. Patient taking differently: Apply 1 application  topically 2 (two) times daily as needed (irritation). 01/09/20  Yes Bing Neighbors, NP    Family History Family History  Problem Relation Age of Onset   Depression Mother    Anxiety disorder Maternal Grandmother    Anxiety disorder Paternal Grandmother    OCD Paternal Grandmother    Depression Paternal Grandmother    Heart attack Father        Deceased, 77   Cerebral aneurysm Father    Heart disease Father     Social History Social History   Tobacco Use   Smoking status: Every Day    Packs/day: 1.50    Years: 40.00    Additional pack years: 0.00    Total pack years: 60.00    Types: Cigarettes    Start date: 03/29/1979   Smokeless tobacco: Current   Tobacco comments:    Currently 2ppd  Vaping Use   Vaping Use: Never used  Substance Use Topics   Alcohol use: Not Currently    Comment: rare   Drug use: No     Allergies   Clomiphene citrate, Doxycycline, Morphine and codeine, Other, Sulfa antibiotics, and Clomiphene   Review of Systems Review of Systems  Constitutional:  Positive for activity change. Negative for appetite change, fatigue and fever.  HENT:  Positive  for congestion, ear pain, sinus pressure and sore throat. Negative for sneezing.   Respiratory:  Positive for cough. Negative for shortness of breath.   Cardiovascular:  Negative for chest pain.  Gastrointestinal:  Negative for abdominal pain, diarrhea, nausea and vomiting.  Neurological:  Positive for headaches. Negative for dizziness and light-headedness.     Physical Exam Triage Vital Signs ED Triage Vitals  Enc Vitals Group     BP 03/14/23 1450 115/77     Pulse Rate 03/14/23 1450 66     Resp 03/14/23 1450 17     Temp 03/14/23 1450 97.9 F (36.6 C)     Temp Source 03/14/23 1450 Oral     SpO2 03/14/23 1450 96 %     Weight 03/14/23 1448 250 lb (113.4 kg)     Height --      Head Circumference --      Peak Flow --      Pain Score 03/14/23 1448 8     Pain Loc --      Pain Edu? --      Excl. in GC? --    No data found.  Updated Vital Signs BP 115/77 (BP Location: Right Arm)   Pulse 66   Temp 97.9 F (36.6 C) (Oral)   Resp 17   Wt 250 lb (113.4 kg)   SpO2 96%   BMI 42.25 kg/m   Visual Acuity Right Eye Distance:   Left Eye Distance:   Bilateral Distance:    Right Eye Near:   Left Eye Near:    Bilateral Near:     Physical Exam Vitals reviewed.  Constitutional:      General: She is awake. She is not in acute distress.    Appearance: Normal appearance. She is well-developed. She is not ill-appearing.     Comments: Very pleasant female appears stated age in no acute distress sitting comfortably in exam room  HENT:     Head: Normocephalic and atraumatic.     Right Ear: Tympanic membrane, ear canal and external ear normal. Tympanic membrane is not erythematous or bulging.  Left Ear: Tympanic membrane, ear canal and external ear normal. Tympanic membrane is not erythematous or bulging.     Nose:     Right Sinus: No maxillary sinus tenderness or frontal sinus tenderness.     Left Sinus: No maxillary sinus tenderness or frontal sinus tenderness.     Mouth/Throat:      Dentition: Has dentures.     Pharynx: Uvula midline. Posterior oropharyngeal erythema present. No oropharyngeal exudate.  Cardiovascular:     Rate and Rhythm: Normal rate and regular rhythm.     Heart sounds: Normal heart sounds, S1 normal and S2 normal. No murmur heard. Pulmonary:     Effort: Pulmonary effort is normal.     Breath sounds: Normal breath sounds. No wheezing, rhonchi or rales.     Comments: Clear to auscultation bilaterally Psychiatric:        Behavior: Behavior is cooperative.      UC Treatments / Results  Labs (all labs ordered are listed, but only abnormal results are displayed) Labs Reviewed  SARS CORONAVIRUS 2 (TAT 6-24 HRS)    EKG   Radiology No results found.  Procedures Procedures (including critical care time)  Medications Ordered in UC Medications - No data to display  Initial Impression / Assessment and Plan / UC Course  I have reviewed the triage vital signs and the nursing notes.  Pertinent labs & imaging results that were available during my care of the patient were reviewed by me and considered in my medical decision making (see chart for details).     Patient is well-appearing, afebrile, nontoxic, nontachycardic.  We discussed likely viral etiology of symptoms though I am concerned for COPD exacerbation given her increased cough/sputum production that improved with amoxicillin.  She was tested for COVID-19 and we discussed that if she is positive she would be candidate for antiviral therapy.  She does not have a recent metabolic panel so would need molnupiravir.  Will start Augmentin to cover for COPD exacerbation as I think this is the more likely diagnosis.  She was encouraged to use over-the-counter medications.  Chest x-ray was deferred as patient had normal lung sounds and oxygen saturation of 96%.  We discussed that she should rest and drink plenty of fluid.  She is to follow-up closely with her primary care.  If she has any worsening  or changing symptoms she needs to be seen immediately including high fever, worsening cough, shortness of breath, chest pain, nausea, vomiting.  Strict return precautions given.  Final Clinical Impressions(s) / UC Diagnoses   Final diagnoses:  COPD exacerbation [facility])     Discharge Instructions      We are testing you for COVID.  We will contact you if you are positive.  Take Augmentin to cover for COPD exacerbation.  Use over-the-counter medications as needed for additional symptom relief including Mucinex, Flonase, Tylenol.  Make sure that you rest and drink plenty of fluid.  If your symptoms or not improving within a few days please return for reevaluation.  If you have any worsening symptoms including high fever, worsening cough, shortness of breath, nausea/vomiting, weakness you need to be seen immediately.  Follow-up with your PCP.     ED Prescriptions     Medication Sig Dispense Auth. Provider   amoxicillin-clavulanate (AUGMENTIN) 875-125 MG tablet Take 1 tablet by mouth every 12 (twelve) hours. 14 tablet Hager Compston, Noberto Retort, PA-C      PDMP not reviewed this encounter.   Jeani Hawking, PA-C 03/14/23  1523  

## 2023-03-15 LAB — SARS CORONAVIRUS 2 (TAT 6-24 HRS): SARS Coronavirus 2: NEGATIVE

## 2023-04-21 DIAGNOSIS — I872 Venous insufficiency (chronic) (peripheral): Secondary | ICD-10-CM | POA: Diagnosis not present

## 2023-04-21 DIAGNOSIS — C44722 Squamous cell carcinoma of skin of right lower limb, including hip: Secondary | ICD-10-CM | POA: Diagnosis not present

## 2023-04-22 DIAGNOSIS — M5114 Intervertebral disc disorders with radiculopathy, thoracic region: Secondary | ICD-10-CM | POA: Diagnosis not present

## 2023-04-22 DIAGNOSIS — M47816 Spondylosis without myelopathy or radiculopathy, lumbar region: Secondary | ICD-10-CM | POA: Diagnosis not present

## 2023-04-22 DIAGNOSIS — M48061 Spinal stenosis, lumbar region without neurogenic claudication: Secondary | ICD-10-CM | POA: Diagnosis not present

## 2023-04-22 DIAGNOSIS — M4804 Spinal stenosis, thoracic region: Secondary | ICD-10-CM | POA: Diagnosis not present

## 2023-04-22 DIAGNOSIS — M4714 Other spondylosis with myelopathy, thoracic region: Secondary | ICD-10-CM | POA: Diagnosis not present

## 2023-05-01 DIAGNOSIS — L538 Other specified erythematous conditions: Secondary | ICD-10-CM | POA: Diagnosis not present

## 2023-05-01 DIAGNOSIS — L821 Other seborrheic keratosis: Secondary | ICD-10-CM | POA: Diagnosis not present

## 2023-05-01 DIAGNOSIS — L814 Other melanin hyperpigmentation: Secondary | ICD-10-CM | POA: Diagnosis not present

## 2023-05-01 DIAGNOSIS — Z85828 Personal history of other malignant neoplasm of skin: Secondary | ICD-10-CM | POA: Diagnosis not present

## 2023-05-01 DIAGNOSIS — L309 Dermatitis, unspecified: Secondary | ICD-10-CM | POA: Diagnosis not present

## 2023-05-01 DIAGNOSIS — Z08 Encounter for follow-up examination after completed treatment for malignant neoplasm: Secondary | ICD-10-CM | POA: Diagnosis not present

## 2023-05-01 DIAGNOSIS — D225 Melanocytic nevi of trunk: Secondary | ICD-10-CM | POA: Diagnosis not present

## 2023-05-01 DIAGNOSIS — L820 Inflamed seborrheic keratosis: Secondary | ICD-10-CM | POA: Diagnosis not present

## 2023-05-01 DIAGNOSIS — D1801 Hemangioma of skin and subcutaneous tissue: Secondary | ICD-10-CM | POA: Diagnosis not present

## 2023-05-06 ENCOUNTER — Emergency Department (HOSPITAL_COMMUNITY): Payer: Medicare HMO

## 2023-05-06 ENCOUNTER — Telehealth: Payer: Self-pay | Admitting: Student

## 2023-05-06 ENCOUNTER — Emergency Department (HOSPITAL_COMMUNITY)
Admission: EM | Admit: 2023-05-06 | Discharge: 2023-05-06 | Disposition: A | Discharge status: Home / Self Care | Payer: Medicare HMO | Attending: Emergency Medicine | Admitting: Emergency Medicine

## 2023-05-06 ENCOUNTER — Encounter (HOSPITAL_COMMUNITY): Payer: Self-pay

## 2023-05-06 ENCOUNTER — Other Ambulatory Visit: Payer: Self-pay

## 2023-05-06 DIAGNOSIS — E871 Hypo-osmolality and hyponatremia: Secondary | ICD-10-CM | POA: Diagnosis not present

## 2023-05-06 DIAGNOSIS — I110 Hypertensive heart disease with heart failure: Secondary | ICD-10-CM | POA: Diagnosis not present

## 2023-05-06 DIAGNOSIS — Z85828 Personal history of other malignant neoplasm of skin: Secondary | ICD-10-CM | POA: Insufficient documentation

## 2023-05-06 DIAGNOSIS — I2510 Atherosclerotic heart disease of native coronary artery without angina pectoris: Secondary | ICD-10-CM | POA: Insufficient documentation

## 2023-05-06 DIAGNOSIS — R197 Diarrhea, unspecified: Secondary | ICD-10-CM | POA: Diagnosis not present

## 2023-05-06 DIAGNOSIS — Z7982 Long term (current) use of aspirin: Secondary | ICD-10-CM | POA: Insufficient documentation

## 2023-05-06 DIAGNOSIS — R9389 Abnormal findings on diagnostic imaging of other specified body structures: Secondary | ICD-10-CM | POA: Diagnosis not present

## 2023-05-06 DIAGNOSIS — R911 Solitary pulmonary nodule: Secondary | ICD-10-CM | POA: Diagnosis not present

## 2023-05-06 DIAGNOSIS — D3502 Benign neoplasm of left adrenal gland: Secondary | ICD-10-CM | POA: Diagnosis not present

## 2023-05-06 DIAGNOSIS — I509 Heart failure, unspecified: Secondary | ICD-10-CM | POA: Diagnosis not present

## 2023-05-06 DIAGNOSIS — R0602 Shortness of breath: Secondary | ICD-10-CM | POA: Insufficient documentation

## 2023-05-06 DIAGNOSIS — R1011 Right upper quadrant pain: Secondary | ICD-10-CM | POA: Diagnosis not present

## 2023-05-06 DIAGNOSIS — Z1152 Encounter for screening for COVID-19: Secondary | ICD-10-CM | POA: Insufficient documentation

## 2023-05-06 DIAGNOSIS — R0789 Other chest pain: Secondary | ICD-10-CM | POA: Insufficient documentation

## 2023-05-06 DIAGNOSIS — R918 Other nonspecific abnormal finding of lung field: Secondary | ICD-10-CM | POA: Diagnosis not present

## 2023-05-06 DIAGNOSIS — R079 Chest pain, unspecified: Secondary | ICD-10-CM | POA: Diagnosis not present

## 2023-05-06 LAB — CBC
HCT: 44.5 % (ref 36.0–46.0)
Hemoglobin: 14.8 g/dL (ref 12.0–15.0)
MCH: 28.8 pg (ref 26.0–34.0)
MCHC: 33.3 g/dL (ref 30.0–36.0)
MCV: 86.7 fL (ref 80.0–100.0)
Platelets: 171 10*3/uL (ref 150–400)
RBC: 5.13 MIL/uL — ABNORMAL HIGH (ref 3.87–5.11)
RDW: 14.7 % (ref 11.5–15.5)
WBC: 8.2 10*3/uL (ref 4.0–10.5)
nRBC: 0 % (ref 0.0–0.2)

## 2023-05-06 LAB — HEPATIC FUNCTION PANEL
ALT: 19 U/L (ref 0–44)
AST: 21 U/L (ref 15–41)
Albumin: 3.6 g/dL (ref 3.5–5.0)
Alkaline Phosphatase: 56 U/L (ref 38–126)
Bilirubin, Direct: 0.2 mg/dL (ref 0.0–0.2)
Indirect Bilirubin: 0.3 mg/dL (ref 0.3–0.9)
Total Bilirubin: 0.5 mg/dL (ref 0.3–1.2)
Total Protein: 7.4 g/dL (ref 6.5–8.1)

## 2023-05-06 LAB — BASIC METABOLIC PANEL
Anion gap: 12 (ref 5–15)
BUN: 5 mg/dL — ABNORMAL LOW (ref 6–20)
CO2: 20 mmol/L — ABNORMAL LOW (ref 22–32)
Calcium: 8.8 mg/dL — ABNORMAL LOW (ref 8.9–10.3)
Chloride: 99 mmol/L (ref 98–111)
Creatinine, Ser: 0.96 mg/dL (ref 0.44–1.00)
GFR, Estimated: >60 mL/min (ref >60)
Glucose, Bld: 101 mg/dL — ABNORMAL HIGH (ref 70–99)
Potassium: 3.7 mmol/L (ref 3.5–5.1)
Sodium: 131 mmol/L — ABNORMAL LOW (ref 135–145)

## 2023-05-06 LAB — RESP PANEL BY RT-PCR (RSV, FLU A&B, COVID)  RVPGX2
Influenza A by PCR: NEGATIVE
Influenza B by PCR: NEGATIVE
Resp Syncytial Virus by PCR: NEGATIVE
SARS Coronavirus 2 by RT PCR: NEGATIVE

## 2023-05-06 LAB — HCG, SERUM, QUALITATIVE: Preg, Serum: NEGATIVE

## 2023-05-06 LAB — LIPASE, BLOOD: Lipase: 35 U/L (ref 11–51)

## 2023-05-06 LAB — TROPONIN I (HIGH SENSITIVITY)
Troponin I (High Sensitivity): 4 ng/L (ref <18)
Troponin I (High Sensitivity): 5 ng/L (ref <18)

## 2023-05-06 MED ORDER — HYDROMORPHONE HCL 1 MG/ML IJ SOLN
0.5000 mg | Freq: Once | INTRAMUSCULAR | Status: AC
  Administered 2023-05-06: 0.5 mg via INTRAVENOUS
  Filled 2023-05-06: qty 1

## 2023-05-06 MED ORDER — HYDROMORPHONE HCL 1 MG/ML IJ SOLN: 1.0000 mg | Freq: Once | INTRAMUSCULAR | Status: DC

## 2023-05-06 MED ORDER — IOHEXOL 350 MG/ML SOLN
75.0000 mL | Freq: Once | INTRAVENOUS | Status: AC | PRN
  Administered 2023-05-06: 75 mL via INTRAVENOUS

## 2023-05-06 NOTE — Discharge Instructions (Signed)
Your history, exam, and workup today did not show evidence of blood clot or convincing evidence of a cardiac cause of your chest discomfort.  With the CT scan did show was an abnormal nodule that appears to have grown over the last few years concerning for possible bronchogenic carcinoma.  I spoke to oncology with Dr. Mosetta Putt who recommended either admission for bronchoscopy and biopsy versus outpatient follow-up with oncology clinic to have further workup.  We had a shared decision-making conversation offering admission versus discharge and you preferred to go home.  Please call the oncology team or your oncologist, cardiology, your primary doctor.  Please rest and stay hydrated.  If any symptoms change or worsen acutely, with return to the nearest emergency department.

## 2023-05-06 NOTE — ED Notes (Addendum)
Patient unhooked herself. Patient did not want hooked back up to get vitals prior to d/c.

## 2023-05-06 NOTE — Telephone Encounter (Signed)
Pt c/o of Chest Pain: STAT if CP now or developed within 24 hours  1. Are you having CP right now? Yes ( more so tightness   2. Are you experiencing any other symptoms (ex. SOB, nausea, vomiting, sweating)? Pain in the back, sob, stress  3. How long have you been experiencing CP? Last 3 days  4. Is your CP continuous or coming and going? Continuous  5. Have you taken Nitroglycerin? no ?

## 2023-05-06 NOTE — Telephone Encounter (Signed)
Pt c/o chest tightness over the last 3 days. She c/o SOB over the last 30 mins. Does not complain of N/V at this time. She does states that she is having back pain and dizzness. Pt rates chest tightness 8/10. Encouraged pt to be seen in the ER. Please advise.

## 2023-05-06 NOTE — ED Provider Notes (Signed)
[facility] AT [facility] Provider Note   CSN: [id number] Arrival date & time: 05/06/23  1341     History  Chief Complaint  Patient presents with   Chest Pain   Shortness of Breath    Shannon [name] is a 57 y.o. female.  The history is provided by the patient and medical records. No language interpreter was used.  Chest Pain Pain location:  Substernal area, L chest and R chest Pain quality: aching, crushing, pressure and tightness   Pain radiates to:  Does not radiate Pain severity:  Severe Onset quality:  Sudden Duration:  3 days Timing:  Constant Progression:  Waxing and waning Chronicity:  New Relieved by:  Nothing Worsened by:  Exertion Ineffective treatments:  None tried Associated symptoms: abdominal pain, back pain (chronic and unchanged), cough, fatigue, nausea, shortness of breath and vomiting   Associated symptoms: no anxiety, no diaphoresis, no fever, no headache, no numbness and no palpitations   Risk factors: coronary artery disease, hypertension, prior DVT/PE and surgery   Risk factors: not female   Shortness of Breath Associated symptoms: abdominal pain, chest pain, cough and vomiting   Associated symptoms: no diaphoresis, no fever, no headaches, no neck pain, no rash and no wheezing        Home Medications Prior to Admission medications   Medication Sig Start Date End Date Taking? Authorizing Provider  albuterol (PROVENTIL HFA;VENTOLIN HFA) 108 (90 Base) MCG/ACT inhaler Inhale 2 puffs into the lungs every 4 (four) hours as needed for wheezing or shortness of breath. 12/21/15   Ward, Layla Maw, DO  ALPRAZolam Prudy Feeler) 1 MG tablet Take 1 mg by mouth 2 (two) times daily as needed for anxiety. 06/12/17   [provider]  amoxicillin-clavulanate (AUGMENTIN) 875-125 MG tablet Take 1 tablet by mouth every 12 (twelve) hours. 03/14/23   Raspet, Noberto Retort, PA-C  aspirin EC 81 MG tablet Take 1 tablet (81 mg total) by mouth daily.  Swallow whole. 09/05/21   Meredeth Ide, MD  bisoprolol-hydrochlorothiazide (ZIAC) 5-6.25 MG tablet Take 1 tablet by mouth daily. Pt takes in the pm 12/21/19   [provider]  carbamide peroxide (DEBROX) 6.5 % OTIC solution Place 5 drops into both ears 2 (two) times daily. 09/25/21   Wallis Bamberg, PA-C  escitalopram (LEXAPRO) 20 MG tablet Take 20 mg by mouth at bedtime. Takes in the pm    [provider]  fluticasone (FLONASE) 50 MCG/ACT nasal spray Place 2 sprays into both nostrils at bedtime.    [provider]  furosemide (LASIX) 20 MG tablet Take 1 tablet (20 mg total) by mouth daily as needed. 08/20/22 08/15/23  Strader, Lennart Pall, PA-C  nitroGLYCERIN (NITROSTAT) 0.4 MG SL tablet Place 0.4 mg under the tongue every 5 (five) minutes as needed for chest pain.    [provider]  oxyCODONE (OXY IR/ROXICODONE) 5 MG immediate release tablet Take 1 tablet (5 mg total) by mouth every 6 (six) hours as needed for severe pain or moderate pain. 08/20/21   Pokhrel, Rebekah Chesterfield, MD  pantoprazole (PROTONIX) 40 MG tablet Take 40 mg by mouth daily.    [provider]  rosuvastatin (CRESTOR) 10 MG tablet Take 10 mg by mouth at bedtime.    [provider]  triamcinolone cream (KENALOG) 0.1 % Apply 1 application topically 2 (two) times daily. Patient taking differently: Apply 1 application  topically 2 (two) times daily as needed (irritation). 01/09/20   Bing Neighbors, NP  Allergies    Clomiphene citrate, Doxycycline, Morphine and codeine, Other, Sulfa antibiotics, and Clomiphene    Review of Systems   Review of Systems  Constitutional:  Positive for chills and fatigue. Negative for diaphoresis and fever.  HENT:  Positive for congestion.   Eyes:  Negative for visual disturbance.  Respiratory:  Positive for cough, chest tightness and shortness of breath. Negative for wheezing.   Cardiovascular:  Positive for chest pain. Negative for palpitations and leg  swelling.  Gastrointestinal:  Positive for abdominal pain, diarrhea, nausea and vomiting. Negative for constipation.  Genitourinary:  Negative for dysuria and flank pain.  Musculoskeletal:  Positive for back pain (chronic and unchanged). Negative for neck pain and neck stiffness.  Skin:  Positive for wound. Negative for rash.  Neurological:  Negative for light-headedness, numbness and headaches.  Psychiatric/Behavioral:  Negative for agitation and confusion.   All other systems reviewed and are negative.   Physical Exam Updated Vital Signs BP 132/84 (BP Location: Right Arm)   Pulse 71   Temp 97.8 F (36.6 C) (Oral)   Resp 18   Ht 5' 4.5" (1.638 m)   Wt 113.4 kg   SpO2 98%   BMI 42.25 kg/m  Physical Exam Vitals and nursing note reviewed.  Constitutional:      General: She is not in acute distress.    Appearance: She is well-developed. She is not ill-appearing, toxic-appearing or diaphoretic.  HENT:     Head: Normocephalic and atraumatic.     Nose: No congestion or rhinorrhea.     Mouth/Throat:     Pharynx: No oropharyngeal exudate or posterior oropharyngeal erythema.  Eyes:     Extraocular Movements: Extraocular movements intact.     Conjunctiva/sclera: Conjunctivae normal.     Pupils: Pupils are equal, round, and reactive to light.  Cardiovascular:     Rate and Rhythm: Normal rate and regular rhythm.     Heart sounds: Normal heart sounds. No murmur heard. Pulmonary:     Effort: Pulmonary effort is normal. No respiratory distress.     Breath sounds: Normal breath sounds. No wheezing, rhonchi or rales.  Chest:     Chest wall: No tenderness.  Abdominal:     General: Abdomen is flat.     Palpations: Abdomen is soft.     Tenderness: There is abdominal tenderness. There is no right CVA tenderness, left CVA tenderness, guarding or rebound.  Musculoskeletal:        General: No swelling or tenderness.     Cervical back: Neck supple. No tenderness.     Right lower leg: No  edema.     Left lower leg: No edema.  Skin:    General: Skin is warm and dry.     Capillary Refill: Capillary refill takes less than 2 seconds.     Findings: No rash.  Neurological:     General: No focal deficit present.     Mental Status: She is alert.     Sensory: No sensory deficit.     Motor: No weakness.  Psychiatric:        Mood and Affect: Mood normal.     ED Results / Procedures / Treatments   Labs (all labs ordered are listed, but only abnormal results are displayed) Labs Reviewed  BASIC METABOLIC PANEL - Abnormal; Notable for the following components:      Result Value   Sodium 131 (*)    CO2 20 (*)    Glucose, Bld 101 (*)  BUN 5 (*)    Calcium 8.8 (*)    All other components within normal limits  CBC - Abnormal; Notable for the following components:   RBC 5.13 (*)    All other components within normal limits  RESP PANEL BY RT-PCR (RSV, FLU A&B, COVID)  RVPGX2  HCG, SERUM, QUALITATIVE  HEPATIC FUNCTION PANEL  LIPASE, BLOOD  TROPONIN I (HIGH SENSITIVITY)  TROPONIN I (HIGH SENSITIVITY)    EKG EKG Interpretation Date/Time:  Wednesday May 06 2023 13:55:30 EDT Ventricular Rate:  70 PR Interval:  156 QRS Duration:  80 QT Interval:  414 QTC Calculation: 447 R Axis:   69  Text Interpretation: Normal sinus rhythm Low voltage QRS Septal infarct , age undetermined Abnormal ECG When compared with ECG of 16-Aug-2021 23:05, PREVIOUS ECG IS PRESENT when compared to prior, overall similar appearance. No STEMI Confirmed by Theda Belfast ([id number]) on 05/06/2023 4:24:20 PM  Radiology US Abdomen Limited RUQ (LIVER/GB)  Result Date: 05/06/2023 CLINICAL DATA:  Right upper quadrant abdominal pain. EXAM: ULTRASOUND ABDOMEN LIMITED RIGHT UPPER QUADRANT COMPARISON:  None Available. FINDINGS: Gallbladder: No gallstones or wall thickening visualized. No sonographic Murphy sign noted by sonographer. Common bile duct: Diameter: 3 mm which is within normal limits. Liver: No focal  lesion identified. Within normal limits in parenchymal echogenicity. Portal vein is patent on color Doppler imaging with normal direction of blood flow towards the liver. Other: None. IMPRESSION: No definite abnormality seen in the right upper quadrant of the abdomen. Electronically Signed   By: Lupita Raider M.D.   On: 05/06/2023 19:38   CT Angio Chest PE W and/or Wo Contrast  Result Date: 05/06/2023 CLINICAL DATA:  Chest pain, shortness of breath EXAM: CT ANGIOGRAPHY CHEST WITH CONTRAST TECHNIQUE: Multidetector CT imaging of the chest was performed using the standard protocol during bolus administration of intravenous contrast. Multiplanar CT image reconstructions and MIPs were obtained to evaluate the vascular anatomy. RADIATION DOSE REDUCTION: This exam was performed according to the departmental dose-optimization program which includes automated exposure control, adjustment of the mA and/or kV according to patient size and/or use of iterative reconstruction technique. CONTRAST:  75mL OMNIPAQUE IOHEXOL 350 MG/ML SOLN COMPARISON:  Chest radiograph dated 05/06/2023. CTA chest dated 08/14/2021. FINDINGS: Cardiovascular: Satisfactory opacification the bilateral pulmonary arteries to the segmental level. No evidence of pulmonary embolism. Although not tailored for right renal thoracic aorta, there is no evidence thoracic aortic aneurysm or dissection. Atherosclerotic calcifications of the aortic arch. Moderate three-coronary atherosclerosis with suspected left coronary stent. Mediastinum/Nodes: No suspicious mediastinal lymphadenopathy. Visualized thyroid is unremarkable. Lungs/Pleura: 2.2 x 1.2 cm lingular nodule along the left heart border (series 7/image 80), previously 1.7 x 0.9 cm, suspicious. Additional scattered nodules in the lungs bilaterally, predominantly subpleural, measuring up to 4 mm. Most of these are unchanged in likely benign. Mild centrilobular and paraseptal emphysematous changes, upper lung  predominant. No focal consolidation. No pleural effusion or pneumothorax. Upper Abdomen: Visualized upper abdomen is notable for a stable 17 mm left adrenal nodule measuring less than 10 HUs, compatible with a benign adrenal adenoma. Vascular calcifications. Musculoskeletal: Degenerative changes of the visualized thoracolumbar spine. Review of the MIP images confirms the above findings. IMPRESSION: No evidence of pulmonary embolism. 2.2 cm lingular nodule, progressive from 2022, suspicious for primary bronchogenic carcinoma. PET-CT is suggested for further evaluation. Stable 17 mm left adrenal adenoma, benign. No follow-up is recommended. Aortic Atherosclerosis (ICD10-I70.0) and Emphysema (ICD10-J43.9). Electronically Signed   By: Charline Bills M.D.   On: 05/06/2023  18:23   DG Chest 2 View  Result Date: 05/06/2023 CLINICAL DATA:  chest pain EXAM: CHEST - 2 VIEW COMPARISON:  09/25/2021. FINDINGS: Bilateral lung fields are clear. Bilateral costophrenic angles are clear. Normal cardio-mediastinal silhouette. No acute osseous abnormalities. The soft tissues are within normal limits. IMPRESSION: No active cardiopulmonary disease. Electronically Signed   By: Jules Schick M.D.   On: 05/06/2023 15:04    Procedures .Suture Removal  Date/Time: 05/06/2023 9:42 PM  Performed by: Heide Scales, MD Authorized by: Heide Scales, MD   Consent:    Consent obtained:  Verbal   Consent given by:  Patient   Risks, benefits, and alternatives were discussed: yes     Risks discussed:  Wound separation   Alternatives discussed:  No treatment Universal protocol:    Immediately prior to procedure, a time out was called: yes     Patient identity confirmed:  Verbally with patient and arm band Location:    Location:  Lower extremity   Lower extremity location:  Leg   Leg location:  R lower leg Procedure details:    Wound appearance:  No signs of infection, clean and good wound healing   Number of  sutures removed:  1 Post-procedure details:    Post-removal:  No dressing applied   Procedure completion:  Tolerated     Medications Ordered in ED Medications  HYDROmorphone (DILAUDID) injection 0.5 mg (0.5 mg Intravenous Given 05/06/23 1748)  iohexol (OMNIPAQUE) 350 MG/ML injection 75 mL (75 mLs Intravenous Contrast Given 05/06/23 1810)  HYDROmorphone (DILAUDID) injection 0.5 mg (0.5 mg Intravenous Given 05/06/23 1930)  HYDROmorphone (DILAUDID) injection 0.5 mg (0.5 mg Intravenous Given 05/06/23 2102)    ED Course/ Medical Decision Making/ A&P                                 Medical Decision Making Amount and/or Complexity of Data Reviewed Labs: ordered. Radiology: ordered.  Risk Prescription drug management.    Corina A Oestreicher is a 57 y.o. female with a past medical history significant for hypertension, hyperlipidemia, CAD on previous cath, CHF, GERD, previous DVT, previous liver abscess, carotid disease and occlusion, skin cancers with recent resection 2 weeks ago and PCOS who presents at the direction of cardiology for evaluation of chest pain.  According to patient, for the last 3 days she has had pain across her chest that is a tightness with shortness of breath.  She reports that it happened fairly suddenly and is worsened with exertion.  She does report nausea and vomiting as well as some diarrhea.  She has had some fatigue and chills but has not had no fevers at home.  She reports no trauma.  She reports chronic back pain that is not different.  She denies any radiation of the discomfort into neck, jaw, or arms.  Denies any leg pain or leg swelling but she did have a skin cancer removed 2 weeks ago and her right calf.  She otherwise denies leg pain or leg swelling.  She reports when she had previous clot it was due to a PICC line and is not on anticoagulation now.  She denies any urinary changes but does have some diarrhea.  She describes the pain as 7 out of 10 in severity and is  constant.  She reports it is still there now.  She was told by cardiology to come in for evaluation given her known coronary  artery disease.  On exam, lungs were clear.  I cannot reproduce her discomfort with chest palpation.  No murmur.  Abdomen was however tender in the right upper quadrant and epigastric area.  Left upper abdomen was nontender as was the rest of the abdomen.  No flank or back tenderness.  No rashes seem to suggest shingles.  Legs nontender nonedematous.  She did have intact pulses.  She has a wound in her right calf with sutures in place that she was post have removed today.  They are well-appearing and does not appear infected.  Anticipate suture removal after workup.  Good pulses in extremities.  No focal neurologic deficits initially.  Pupil symmetric and reactive normal extraocular movements.  Back nonfocally tender.  No bony tenderness.  EKG does not show STEMI.  Clinically concerned about several things.  Given the ongoing pandemic with her chills, productive cough, and the chest pain shortness of breath and GI symptoms, will test for COVID.  Will get screening labs.  She had some labs in triage however they did not do abdominal labs, given the nausea vomiting diarrhea and abdominal tenderness, will get right upper quadrant ultrasound and get labs to look for hepatic function and lipase.  We will get a CT PE study given her recent surgery and the chest pain and shortness of breath that started suddenly.  Although she has some documentation in her chart for possible AAA, I cannot see this on recent CT imaging and she reports the pain is not sharp nor is a go straight to her back.  Will get the CT PE study and get the ultrasound of the abdomen.  Will get a second troponin as the first was normal.  Will put her on the monitor.  Anticipate if everything is negative, will discuss with cardiology given her history of mild to moderate coronary artery disease 5 years ago on previous cath  and her exertional chest pain with shortness of breath and nausea.        9:19 PM Workup continued to return.  Troponin negative x 2.  Hepatic function lipase not elevated.  Metabolic panel showed mild hyponatremia and low CO2 likely related to the recent diarrhea reported.  COVID flu and RSV negative.  She is not pregnant.  CBC shows no leukocytosis and no anemia.  Ultrasound of the abdomen did not show acute cholecystitis and chest x-ray showed no acute cardiopulmonary disease.  Given the patient's chest pain and shortness of breath with recent surgery, a CT PE study was obtained and does not show evidence of pulmonary bolus him.  What it does show however is concern for new bronchogenic carcinoma.  I called and spoke with Dr. Mosetta Putt with oncology about management.  She said we could either admit for pulmonary consultation and possible inpatient bronchoscopy to get a biopsy versus discharge home and outpatient oncology follow-up to discuss outpatient biopsy.  I spoke to the patient about this and she does not want to be admitted.  She would rather go home and call oncology as an outpatient to work this up.  Given her troponin negative x 2 and reassuring exam otherwise, patient does not want to wait for cardiology consultation at this time and does not want to be admitted.  She understands return precautions and follow-up with cardiology, PCP, and oncology teams.    Patient was given 1 more dose of pain medicine and p.o. challenge.  She will be discharged for outpatient follow-up.  Patient passed p.o. challenge  and ambulated normally.  She requested her suture be removed from her right leg wound from 2 weeks ago and is well-appearing.  Suture was removed without difficulty.  She will follow-up with her teams as we discussed.  Patient discharged in stable condition for follow-up with oncology, cardiology, and PCP.        Final Clinical Impression(s) / ED Diagnoses Final diagnoses:  Atypical  chest pain  Abnormal CT of the chest  Nodule of left lung  Shortness of breath  Diarrhea, unspecified type  Hyponatremia    Rx / DC Orders ED Discharge Orders     None       Clinical Impression: 1. Atypical chest pain   2. Abnormal CT of the chest   3. Nodule of left lung   4. Shortness of breath   5. Diarrhea, unspecified type   6. Hyponatremia     Disposition: Discharge  Condition: Good  I have discussed the results, Dx and Tx plan with the pt(& family if present). He/she/they expressed understanding and agree(s) with the plan. Discharge instructions discussed at great length. Strict return precautions discussed and pt &/or family have verbalized understanding of the instructions. No further questions at time of discharge.    New Prescriptions   No medications on file    Follow Up: Malachy Mood, MD [address] [place] [address] [phone]   with Oncology for follow up  your cardiologist     Mitzi Hansen, NP [address] [place] [address] [place] [address] [phone]         , Canary Brim, MD 05/06/23 [phone]

## 2023-05-06 NOTE — ED Triage Notes (Signed)
PT arrives via POV. Pt reports intermittent chest pain and sob for the past 3 days. Pt also c/o nausea, diarrhea, and dizziness as well.

## 2023-05-07 ENCOUNTER — Other Ambulatory Visit: Payer: Self-pay

## 2023-05-07 DIAGNOSIS — R0789 Other chest pain: Secondary | ICD-10-CM | POA: Diagnosis not present

## 2023-05-07 DIAGNOSIS — R911 Solitary pulmonary nodule: Secondary | ICD-10-CM

## 2023-05-08 ENCOUNTER — Telehealth: Payer: Self-pay | Admitting: Student

## 2023-05-08 NOTE — Telephone Encounter (Signed)
Patient is requesting a call back to discuss CT results. She states she is very concerned and has several questions.

## 2023-05-08 NOTE — Telephone Encounter (Signed)
Pt returning nurse's call. Please advise

## 2023-05-08 NOTE — Telephone Encounter (Signed)
Patient stated that she had a CT in the ER and is unsure of the results. Pt stated that over read mentioned nodule next to the heart and she is very concerned. Pt stated that hospitalist had mentioned seeing oncologist and she would like providers recommendation.   Please advise.  Former [facility] pt.

## 2023-05-08 NOTE — Telephone Encounter (Addendum)
Mailbox full, unable to leave message

## 2023-05-11 NOTE — Telephone Encounter (Signed)
Attempted to contact patient regarding CT results. Left a message for patient to call office back.

## 2023-05-11 NOTE — Telephone Encounter (Signed)
Patient notified of providers advice via MyChart.

## 2023-05-13 DIAGNOSIS — J432 Centrilobular emphysema: Secondary | ICD-10-CM | POA: Diagnosis not present

## 2023-05-13 DIAGNOSIS — I700 Atherosclerosis of aorta: Secondary | ICD-10-CM | POA: Diagnosis not present

## 2023-05-13 DIAGNOSIS — F332 Major depressive disorder, recurrent severe without psychotic features: Secondary | ICD-10-CM | POA: Diagnosis not present

## 2023-05-13 DIAGNOSIS — R911 Solitary pulmonary nodule: Secondary | ICD-10-CM | POA: Diagnosis not present

## 2023-05-13 DIAGNOSIS — J449 Chronic obstructive pulmonary disease, unspecified: Secondary | ICD-10-CM | POA: Diagnosis not present

## 2023-05-13 DIAGNOSIS — Z6841 Body Mass Index (BMI) 40.0 and over, adult: Secondary | ICD-10-CM | POA: Diagnosis not present

## 2023-05-13 DIAGNOSIS — I25119 Atherosclerotic heart disease of native coronary artery with unspecified angina pectoris: Secondary | ICD-10-CM | POA: Diagnosis not present

## 2023-05-13 DIAGNOSIS — F411 Generalized anxiety disorder: Secondary | ICD-10-CM | POA: Diagnosis not present

## 2023-05-17 NOTE — Progress Notes (Unsigned)
[facility] [phone]   Fax:([phone]  CONSULT NOTE  REFERRING PHYSICIAN: Dr. Mosetta Putt  REASON FOR CONSULTATION:  Lung Nodule   HPI Shannon [name] is a 57 y.o. female with a past medical history significant for coronary artery disease, hypertension, AAA, carotid artery disease, unstable angina, chronic diastolic heart failure, deep, liver abscess, and hyperlipidemia is referred to the clinic for lung nodule.  The patient presented to the emergency room on 05/06/2023 for the chief complaint of chest pain and shortness of breath under the instruction of her cardiologist. She had a CTA performed to assess for PE.  Her troponins were negative x 2.  The CT a showed 2.2 cm lingular nodule progressive from 2022  (was 1.1 cm in 2022), suspicious for primary bronchogenic carcinoma.  There was also a stable 17 mm left adrenal adenoma which is benign.  The patient states that she had a follow-up CT scan at [facility] in 2023.  The patient is here today to establish care.    Today, the patient is feeling anxious.  She is also reporting some congestion which she attributes to allergies.  She uses Flonase.  She also mention she had a diarrheal illness for 2 to 3 weeks which is improving at this time.  She is currently only having diarrhea 1-2 times per day at this time.  She denies any fever, chills, night sweats, or unexplained weight loss.  Denies any nausea but she did have an episode of vomiting once after drinking tea.  She reports some cough secondary to postnasal drainage.  Denies any hemoptysis.  Denies any current chest pain.  She denies any constipation.  Her family history consist of a mother who had pancreatic cancer, thyroid dysfunction, hypertension, and arthritis.  Her father had coronary artery disease and aneurysm.    The patient is currently on disability.  She is married.  She does not have any children.  She is a current smoker.  She is trying to quit with  nicotine patches.  She has average half a pack to 2 packs of cigarettes for most of her life.  She is currently smoking 2 packs of cigarettes per day.     HPI  Past Medical History:  Diagnosis Date   Anxiety    Arthritis    oa needs hip replacement on right   Asthma    allergies    Cancer (HCC)    squamous cell areas removed from vulva and pre caner areas removed from leg    Carotid artery occlusion    Complication of anesthesia    major depression after general anesthesia   Coronary artery disease    LHC 11/25/12 90% stenosis mid LCx & otherwise nonobstructive dz w/ EF 60-65% S/p PTCA/DES to LCx   Depression    Dyspnea    with exertion    Elevated cholesterol    Exertional dyspnea    chronic   Fibromyalgia    GERD (gastroesophageal reflux disease)    History of cardiovascular stress test 06/2015   low risk   HPV in female    Hyperlipidemia    Hypertension    Liver abscess 09/09/2021   MI (myocardial infarction) (HCC) fe 17, 2014   Obesity    Pneumonia    hx of x 2 years ago    Polycystic ovary disease    Skin tear of upper arm without complication, left, sequela    small skin tear left upper arm no drainage  for 1 week   Sleep apnea    mild no cpap    Tobacco abuse     Past Surgical History:  Procedure Laterality Date   CORONARY ANGIOPLASTY WITH STENT PLACEMENT     LEFT HEART CATH AND CORONARY ANGIOGRAPHY N/A 08/09/2018   Procedure: LEFT HEART CATH AND CORONARY ANGIOGRAPHY;  Surgeon: Yvonne Kendall, MD;  Location: [facility];  Service: Cardiovascular;  Laterality: N/A;   LEFT HEART CATHETERIZATION WITH CORONARY ANGIOGRAM N/A 11/15/2012   Procedure: LEFT HEART CATHETERIZATION WITH CORONARY ANGIOGRAM;  Surgeon: Vesta Mixer, MD;  Location: [facility] CATH LAB;  Service: Cardiovascular;  Laterality: N/A;   PERCUTANEOUS CORONARY STENT INTERVENTION (PCI-S)  11/15/2012   Procedure: PERCUTANEOUS CORONARY STENT INTERVENTION (PCI-S);  Surgeon: Vesta Mixer, MD;   Location: [facility] CATH LAB;  Service: Cardiovascular;;   skin cancer removed right lower leg Right    TONSILLECTOMY     TOTAL HIP ARTHROPLASTY Right 01/16/2021   Procedure: TOTAL HIP ARTHROPLASTY ANTERIOR APPROACH;  Surgeon: Ollen Gross, MD;  Location: [facility] ORS;  Service: Orthopedics;  Laterality: Right;    uterus ablation     VULVECTOMY N/A 11/08/2019   Procedure: excision of VULVA, vulvoscopy;  Surgeon: Marcelle Overlie, MD;  Location: [facility];  Service: Gynecology;  Laterality: N/A;    Family History  Problem Relation Age of Onset   Depression Mother    Anxiety disorder Maternal Grandmother    Anxiety disorder Paternal Grandmother    OCD Paternal Grandmother    Depression Paternal Grandmother    Heart attack Father        Deceased, 40   Cerebral aneurysm Father    Heart disease Father     Social History Social History   Tobacco Use   Smoking status: Every Day    Current packs/day: 1.50    Average packs/day: 1.5 packs/day for 44.1 years (66.2 ttl pk-yrs)    Types: Cigarettes    Start date: 03/29/1979   Smokeless tobacco: Current   Tobacco comments:    Currently 2ppd  Vaping Use   Vaping status: Never Used  Substance Use Topics   Alcohol use: Not Currently    Comment: rare   Drug use: No    Allergies  Allergen Reactions   Clomiphene Citrate Nausea And Vomiting   Doxycycline Other (See Comments)   Morphine And Codeine Other (See Comments)    PATIENT REFUSES THIS MEDICATION: states that she does not tolerate this medication well   Other     General anesthesia causes depressive symptoms    Sulfa Antibiotics Other (See Comments)    Not sure ? rash   Clomiphene Rash    Current Outpatient Medications  Medication Sig Dispense Refill   albuterol (PROVENTIL HFA;VENTOLIN HFA) 108 (90 Base) MCG/ACT inhaler Inhale 2 puffs into the lungs every 4 (four) hours as needed for wheezing or shortness of breath. 1 Inhaler 0   ALPRAZolam (XANAX) 1 MG tablet  Take 1 mg by mouth 2 (two) times daily as needed for anxiety.  2   amoxicillin-clavulanate (AUGMENTIN) 875-125 MG tablet Take 1 tablet by mouth every 12 (twelve) hours. 14 tablet 0   aspirin EC 81 MG tablet Take 1 tablet (81 mg total) by mouth daily. Swallow whole. 30 tablet 11   bisoprolol-hydrochlorothiazide (ZIAC) 5-6.25 MG tablet Take 1 tablet by mouth daily. Pt takes in the pm     carbamide peroxide (DEBROX) 6.5 % OTIC solution Place 5 drops into both ears 2 (two)  times daily. 15 mL 0   escitalopram (LEXAPRO) 20 MG tablet Take 20 mg by mouth at bedtime. Takes in the pm     fluticasone (FLONASE) 50 MCG/ACT nasal spray Place 2 sprays into both nostrils at bedtime.     furosemide (LASIX) 20 MG tablet Take 1 tablet (20 mg total) by mouth daily as needed. 90 tablet 3   nitroGLYCERIN (NITROSTAT) 0.4 MG SL tablet Place 0.4 mg under the tongue every 5 (five) minutes as needed for chest pain.     oxyCODONE (OXY IR/ROXICODONE) 5 MG immediate release tablet Take 1 tablet (5 mg total) by mouth every 6 (six) hours as needed for severe pain or moderate pain. 15 tablet 0   pantoprazole (PROTONIX) 40 MG tablet Take 40 mg by mouth daily.     rosuvastatin (CRESTOR) 10 MG tablet Take 10 mg by mouth at bedtime.     triamcinolone cream (KENALOG) 0.1 % Apply 1 application topically 2 (two) times daily. (Patient taking differently: Apply 1 application  topically 2 (two) times daily as needed (irritation).) 454 g 0   No current facility-administered medications for this visit.    REVIEW OF SYSTEMS:   Review of Systems  Constitutional: Negative for appetite change, chills, fatigue, fever and unexpected weight change.  HENT: Positive for nasal congestion.  Negative for mouth sores, nosebleeds, sore throat and trouble swallowing.   Eyes: Negative for eye problems and icterus.  Respiratory: Positive for cough.  Negative for  hemoptysis, shortness of breath and wheezing.   Cardiovascular: Negative for chest pain and  leg swelling.  Gastrointestinal: Positive for improving diarrhea.  Negative for abdominal pain, constipation, nausea and vomiting.  Genitourinary: Negative for bladder incontinence, difficulty urinating, dysuria, frequency and hematuria.   Musculoskeletal: Negative for back pain, gait problem, neck pain and neck stiffness.  Skin: Negative for itching and rash.  Neurological: Negative for dizziness, extremity weakness, gait problem, headaches, light-headedness and seizures.  Hematological: Negative for adenopathy. Does not bruise/bleed easily.  Psychiatric/Behavioral: Negative for confusion, depression and sleep disturbance. The patient is not nervous/anxious.     PHYSICAL EXAMINATION:  There were no vitals taken for this visit.  ECOG PERFORMANCE STATUS: 1  Physical Exam  Constitutional: Oriented to person, place, and time and well-developed, well-nourished, and in no distress.  HENT:  Head: Normocephalic and atraumatic.  Mouth/Throat: Oropharynx is clear and moist. No oropharyngeal exudate.  Eyes: Conjunctivae are normal. Right eye exhibits no discharge. Left eye exhibits no discharge. No scleral icterus.  Neck: Normal range of motion. Neck supple.  Cardiovascular: Normal rate, regular rhythm, normal heart sounds and intact distal pulses.   Pulmonary/Chest: Effort normal.  Quiet breath sounds bilaterally.  No respiratory distress. No wheezes. No rales.  Abdominal: Soft. Bowel sounds are normal. Exhibits no distension and no mass. There is no tenderness.  Musculoskeletal: Normal range of motion. Exhibits no edema.  Lymphadenopathy:    No cervical adenopathy.  Neurological: Alert and oriented to person, place, and time. Exhibits normal muscle tone. Gait normal. Coordination normal.  Skin: Skin is warm and dry. No rash noted. Not diaphoretic. No erythema. No pallor.  Psychiatric: Mood, memory and judgment normal.  Vitals reviewed.  LABORATORY DATA: Lab Results  Component Value Date    WBC 7.0 05/19/2023   HGB 14.9 05/19/2023   HCT 44.5 05/19/2023   MCV 89.4 05/19/2023   PLT 154 05/19/2023      Chemistry      Component Value Date/Time   NA 138 05/19/2023 1317  K 4.2 05/19/2023 1317   CL 103 05/19/2023 1317   CO2 31 05/19/2023 1317   BUN 8 05/19/2023 1317   CREATININE 1.00 05/19/2023 1317   CREATININE 0.89 10/16/2021 1122      Component Value Date/Time   CALCIUM 9.4 05/19/2023 1317   ALKPHOS 67 05/19/2023 1317   AST 14 (L) 05/19/2023 1317   ALT 13 05/19/2023 1317   BILITOT 0.4 05/19/2023 1317       RADIOGRAPHIC STUDIES: US Abdomen Limited RUQ (LIVER/GB)  Result Date: 05/06/2023 CLINICAL DATA:  Right upper quadrant abdominal pain. EXAM: ULTRASOUND ABDOMEN LIMITED RIGHT UPPER QUADRANT COMPARISON:  None Available. FINDINGS: Gallbladder: No gallstones or wall thickening visualized. No sonographic Murphy sign noted by sonographer. Common bile duct: Diameter: 3 mm which is within normal limits. Liver: No focal lesion identified. Within normal limits in parenchymal echogenicity. Portal vein is patent on color Doppler imaging with normal direction of blood flow towards the liver. Other: None. IMPRESSION: No definite abnormality seen in the right upper quadrant of the abdomen. Electronically Signed   By: Lupita Raider M.D.   On: 05/06/2023 19:38   CT Angio Chest PE W and/or Wo Contrast  Result Date: 05/06/2023 CLINICAL DATA:  Chest pain, shortness of breath EXAM: CT ANGIOGRAPHY CHEST WITH CONTRAST TECHNIQUE: Multidetector CT imaging of the chest was performed using the standard protocol during bolus administration of intravenous contrast. Multiplanar CT image reconstructions and MIPs were obtained to evaluate the vascular anatomy. RADIATION DOSE REDUCTION: This exam was performed according to the departmental dose-optimization program which includes automated exposure control, adjustment of the mA and/or kV according to patient size and/or use of iterative reconstruction  technique. CONTRAST:  75mL OMNIPAQUE IOHEXOL 350 MG/ML SOLN COMPARISON:  Chest radiograph dated 05/06/2023. CTA chest dated 08/14/2021. FINDINGS: Cardiovascular: Satisfactory opacification the bilateral pulmonary arteries to the segmental level. No evidence of pulmonary embolism. Although not tailored for right renal thoracic aorta, there is no evidence thoracic aortic aneurysm or dissection. Atherosclerotic calcifications of the aortic arch. Moderate three-coronary atherosclerosis with suspected left coronary stent. Mediastinum/Nodes: No suspicious mediastinal lymphadenopathy. Visualized thyroid is unremarkable. Lungs/Pleura: 2.2 x 1.2 cm lingular nodule along the left heart border (series 7/image 80), previously 1.7 x 0.9 cm, suspicious. Additional scattered nodules in the lungs bilaterally, predominantly subpleural, measuring up to 4 mm. Most of these are unchanged in likely benign. Mild centrilobular and paraseptal emphysematous changes, upper lung predominant. No focal consolidation. No pleural effusion or pneumothorax. Upper Abdomen: Visualized upper abdomen is notable for a stable 17 mm left adrenal nodule measuring less than 10 HUs, compatible with a benign adrenal adenoma. Vascular calcifications. Musculoskeletal: Degenerative changes of the visualized thoracolumbar spine. Review of the MIP images confirms the above findings. IMPRESSION: No evidence of pulmonary embolism. 2.2 cm lingular nodule, progressive from 2022, suspicious for primary bronchogenic carcinoma. PET-CT is suggested for further evaluation. Stable 17 mm left adrenal adenoma, benign. No follow-up is recommended. Aortic Atherosclerosis (ICD10-I70.0) and Emphysema (ICD10-J43.9). Electronically Signed   By: Charline Bills M.D.   On: 05/06/2023 18:23   DG Chest 2 View  Result Date: 05/06/2023 CLINICAL DATA:  chest pain EXAM: CHEST - 2 VIEW COMPARISON:  09/25/2021. FINDINGS: Bilateral lung fields are clear. Bilateral costophrenic angles are  clear. Normal cardio-mediastinal silhouette. No acute osseous abnormalities. The soft tissues are within normal limits. IMPRESSION: No active cardiopulmonary disease. Electronically Signed   By: Jules Schick M.D.   On: 05/06/2023 15:04    ASSESSMENT: This is a very pleasant  57 year old Caucasian female referred to the clinic for suspicious lung cancer, pending further staging workup.  This was found in August 2024 on CT angiogram.  However she had CT scan performed in 2022 and the lung nodule was 1.1 cm at that time.  She had a follow-up CT scan at [facility] in 2023.  The patient was seen with Dr. Arbutus Ped today.  Dr. Arbutus Ped had a lengthy discussion with the patient today about her current condition and further workup.  The patient will require a PET scan to see if this has metabolic activity.  If this has metabolic activity, then Dr. Arbutus Ped may refer the patient to surgeon for consideration of surgery.  Dr. Arbutus Ped feels that the location may be difficult to biopsy.    Will see the patient back for follow-up visit in 2 weeks once we have the results of her PET scan discuss the next steps at that time.  The meantime, the patient was strongly encouraged to quit smoking. She is trying and using nicotine patches.   The patient mention she has claustrophobia.  It looks like she has a prescription for Xanax.  I will advised the patient to take Xanax prior to her PET CT scan.  Patient did okay with her CT scan that she had performed in the ER.  The patient voices understanding of current disease status and treatment options and is in agreement with the current care plan.  All questions were answered. The patient knows to call the clinic with any problems, questions or concerns. We can certainly see the patient much sooner if necessary.  Thank you so much for allowing me to participate in the care of Shannon [name]. I will continue to follow up the patient with you and assist in her care.  Disclaimer:  This note was dictated with voice recognition software. Similar sounding words can inadvertently be transcribed and may not be corrected upon review.   Merlon Alcorta L Haneef Hallquist May 19, 2023, 2:21 PM  ADDENDUM: Hematology/Oncology Attending: I had a face-to-face encounter with the patient today.  I reviewed her record, lab, scan and recommended her care plan.  This is a very pleasant 57 years old white female with history of coronary artery disease, abdominal aortic aneurysm, carotid artery disease, hypertension, unstable angina as well as chronic diastolic heart failure, liver abscess as well as dyslipidemia.  The patient presented to the emergency department less than 2 weeks ago complaining of chest pain and shortness of breath.  She had CT angiogram of the chest to rule out pulmonary embolism and incidentally it showed 2.2 cm lingular nodule progressive from 2022 when it was 1.1 cm and concerning for primary bronchogenic carcinoma.  There was also a stable 1.7 cm left adrenal adenoma. The patient was referred to [place] today for evaluation and recommendation regarding her condition. When seen today she is very anxious. I had a lengthy discussion with the patient and her friend about her current condition and further investigation to confirm her diagnosis. I recommended for the patient to have a PET scan for further identification of this nodule and if suspicious for malignancy, I may consider referring her to pulmonary medicine or directly to cardiothoracic surgery for surgical resection if she has appropriate cardiac and pulmonary reserve. The patient will come back for follow-up visit in around 2 weeks for discussion of her PET scan results and further recommendation regarding her condition. She was advised to call immediately if she has any other concerning symptoms in the interval.  The total time spent in the appointment was 60 minutes. Disclaimer: This note was dictated with voice recognition  software. Similar sounding words can inadvertently be transcribed and may be missed upon review. Lajuana Matte, MD

## 2023-05-18 ENCOUNTER — Other Ambulatory Visit: Payer: Self-pay | Admitting: Physician Assistant

## 2023-05-18 DIAGNOSIS — R911 Solitary pulmonary nodule: Secondary | ICD-10-CM

## 2023-05-19 ENCOUNTER — Other Ambulatory Visit: Payer: Self-pay

## 2023-05-19 ENCOUNTER — Inpatient Hospital Stay: Payer: Medicare HMO | Attending: Physician Assistant | Admitting: Physician Assistant

## 2023-05-19 ENCOUNTER — Encounter: Payer: Self-pay | Admitting: Physician Assistant

## 2023-05-19 ENCOUNTER — Inpatient Hospital Stay: Payer: Medicare HMO

## 2023-05-19 DIAGNOSIS — R197 Diarrhea, unspecified: Secondary | ICD-10-CM | POA: Diagnosis not present

## 2023-05-19 DIAGNOSIS — R911 Solitary pulmonary nodule: Secondary | ICD-10-CM

## 2023-05-19 DIAGNOSIS — R918 Other nonspecific abnormal finding of lung field: Secondary | ICD-10-CM | POA: Diagnosis not present

## 2023-05-19 DIAGNOSIS — F17210 Nicotine dependence, cigarettes, uncomplicated: Secondary | ICD-10-CM | POA: Diagnosis not present

## 2023-05-19 DIAGNOSIS — Z800 Family history of malignant neoplasm of digestive organs: Secondary | ICD-10-CM | POA: Diagnosis not present

## 2023-05-19 DIAGNOSIS — C3490 Malignant neoplasm of unspecified part of unspecified bronchus or lung: Secondary | ICD-10-CM

## 2023-05-19 LAB — CMP (CANCER CENTER ONLY)
ALT: 13 U/L (ref 0–44)
AST: 14 U/L — ABNORMAL LOW (ref 15–41)
Albumin: 3.9 g/dL (ref 3.5–5.0)
Alkaline Phosphatase: 67 U/L (ref 38–126)
Anion gap: 4 — ABNORMAL LOW (ref 5–15)
BUN: 8 mg/dL (ref 6–20)
CO2: 31 mmol/L (ref 22–32)
Calcium: 9.4 mg/dL (ref 8.9–10.3)
Chloride: 103 mmol/L (ref 98–111)
Creatinine: 1 mg/dL (ref 0.44–1.00)
GFR, Estimated: >60 mL/min (ref >60)
Glucose, Bld: 115 mg/dL — ABNORMAL HIGH (ref 70–99)
Potassium: 4.2 mmol/L (ref 3.5–5.1)
Sodium: 138 mmol/L (ref 135–145)
Total Bilirubin: 0.4 mg/dL (ref 0.3–1.2)
Total Protein: 7.5 g/dL (ref 6.5–8.1)

## 2023-05-19 LAB — CBC WITH DIFFERENTIAL (CANCER CENTER ONLY)
Abs Immature Granulocytes: 0.03 10*3/uL (ref 0.00–0.07)
Basophils Absolute: 0.1 10*3/uL (ref 0.0–0.1)
Basophils Relative: 1 %
Eosinophils Absolute: 0.1 10*3/uL (ref 0.0–0.5)
Eosinophils Relative: 2 %
HCT: 44.5 % (ref 36.0–46.0)
Hemoglobin: 14.9 g/dL (ref 12.0–15.0)
Immature Granulocytes: 0 %
Lymphocytes Relative: 28 %
Lymphs Abs: 2 10*3/uL (ref 0.7–4.0)
MCH: 29.9 pg (ref 26.0–34.0)
MCHC: 33.5 g/dL (ref 30.0–36.0)
MCV: 89.4 fL (ref 80.0–100.0)
Monocytes Absolute: 0.4 10*3/uL (ref 0.1–1.0)
Monocytes Relative: 6 %
Neutro Abs: 4.4 10*3/uL (ref 1.7–7.7)
Neutrophils Relative %: 63 %
Platelet Count: 154 10*3/uL (ref 150–400)
RBC: 4.98 MIL/uL (ref 3.87–5.11)
RDW: 15.2 % (ref 11.5–15.5)
WBC Count: 7 10*3/uL (ref 4.0–10.5)
nRBC: 0 % (ref 0.0–0.2)

## 2023-05-26 ENCOUNTER — Encounter (HOSPITAL_COMMUNITY)
Admission: RE | Admit: 2023-05-26 | Discharge: 2023-05-26 | Disposition: A | Discharge status: Home / Self Care | Payer: Medicare HMO | Source: Ambulatory Visit | Attending: Physician Assistant | Admitting: Physician Assistant

## 2023-05-26 DIAGNOSIS — C3490 Malignant neoplasm of unspecified part of unspecified bronchus or lung: Secondary | ICD-10-CM | POA: Diagnosis not present

## 2023-05-26 DIAGNOSIS — R911 Solitary pulmonary nodule: Secondary | ICD-10-CM | POA: Diagnosis not present

## 2023-05-26 LAB — GLUCOSE, CAPILLARY: Glucose-Capillary: 124 mg/dL — ABNORMAL HIGH (ref 70–99)

## 2023-05-26 MED ORDER — FLUDEOXYGLUCOSE F - 18 (FDG) INJECTION
12.5000 | Freq: Once | INTRAVENOUS | Status: AC
  Administered 2023-05-26: 12.42 via INTRAVENOUS

## 2023-05-27 DIAGNOSIS — L923 Foreign body granuloma of the skin and subcutaneous tissue: Secondary | ICD-10-CM | POA: Diagnosis not present

## 2023-05-27 DIAGNOSIS — D045 Carcinoma in situ of skin of trunk: Secondary | ICD-10-CM | POA: Diagnosis not present

## 2023-06-06 NOTE — Progress Notes (Unsigned)
[facility] OFFICE PROGRESS NOTE  Verl Blalock [address] [place] [address]  DIAGNOSIS: likely Stage I (T1c, N0, M0) lung cancer. She presented with a 2.5 cm lingular nodule suspicious for primary bronchogenic carcinoma. She was diagnosed in August 2024.    PRIOR THERAPY: None   CURRENT THERAPY: Referral to CT surgery   INTERVAL HISTORY: Shannon [name] 57 y.o. female returns to the clinic today for a follow up visit accompanied by her husband. The patient is being worked up for lung cancer. She established care with [place] on 05/19/23. She needed to undergo further workup. She recently had a PET scan.   Since last being seen, she denies any major changes in her health. She is anxious about her condition and wants to get it taken care of. She is planning on quitting smoking after her appointment today. She has nicotine patches at home.   She denies any fever, chills, night sweats, or unexplained weight loss. She reports some cough secondary to postnasal drainage.  She reports that sometimes her breathing is shallow but she is not sure if it is related to her lung malignancy or not.  Denies any hemoptysis.  She sometimes has a cough.  She started taking Zyrtec for allergies yesterday and believes this has helped.  She is here for evaluation and to discuss the next steps in her care.    MEDICAL HISTORY: Past Medical History:  Diagnosis Date   Anxiety    Arthritis    oa needs hip replacement on right   Asthma    allergies    Cancer (HCC)    squamous cell areas removed from vulva and pre caner areas removed from leg    Carotid artery occlusion    Complication of anesthesia    major depression after general anesthesia   Coronary artery disease    LHC 11/25/12 90% stenosis mid LCx & otherwise nonobstructive dz w/ EF 60-65% S/p PTCA/DES to LCx   Depression    Dyspnea    with exertion    Elevated cholesterol    Exertional dyspnea    chronic    Fibromyalgia    GERD (gastroesophageal reflux disease)    History of cardiovascular stress test 06/2015   low risk   HPV in female    Hyperlipidemia    Hypertension    Liver abscess 09/09/2021   MI (myocardial infarction) (HCC) fe 17, 2014   Obesity    Pneumonia    hx of x 2 years ago    Polycystic ovary disease    Skin tear of upper arm without complication, left, sequela    small skin tear left upper arm no drainage for 1 week   Sleep apnea    mild no cpap    Tobacco abuse     ALLERGIES:  is allergic to clomiphene citrate, doxycycline, morphine and codeine, other, sulfa antibiotics, and clomiphene.  MEDICATIONS:  Current Outpatient Medications  Medication Sig Dispense Refill   albuterol (PROVENTIL HFA;VENTOLIN HFA) 108 (90 Base) MCG/ACT inhaler Inhale 2 puffs into the lungs every 4 (four) hours as needed for wheezing or shortness of breath. 1 Inhaler 0   ALPRAZolam (XANAX) 1 MG tablet Take 1 mg by mouth 2 (two) times daily as needed for anxiety.  2   amoxicillin-clavulanate (AUGMENTIN) 875-125 MG tablet Take 1 tablet by mouth every 12 (twelve) hours. 14 tablet 0   aspirin EC 81 MG tablet Take 1 tablet (81 mg  total) by mouth daily. Swallow whole. 30 tablet 11   bisoprolol-hydrochlorothiazide (ZIAC) 5-6.25 MG tablet Take 1 tablet by mouth daily. Pt takes in the pm     carbamide peroxide (DEBROX) 6.5 % OTIC solution Place 5 drops into both ears 2 (two) times daily. 15 mL 0   escitalopram (LEXAPRO) 20 MG tablet Take 20 mg by mouth at bedtime. Takes in the pm     fluticasone (FLONASE) 50 MCG/ACT nasal spray Place 2 sprays into both nostrils at bedtime.     furosemide (LASIX) 20 MG tablet Take 1 tablet (20 mg total) by mouth daily as needed. 90 tablet 3   nitroGLYCERIN (NITROSTAT) 0.4 MG SL tablet Place 0.4 mg under the tongue every 5 (five) minutes as needed for chest pain.     oxyCODONE (OXY IR/ROXICODONE) 5 MG immediate release tablet Take 1 tablet (5 mg total) by mouth every 6  (six) hours as needed for severe pain or moderate pain. 15 tablet 0   pantoprazole (PROTONIX) 40 MG tablet Take 40 mg by mouth daily.     rosuvastatin (CRESTOR) 10 MG tablet Take 10 mg by mouth at bedtime.     triamcinolone cream (KENALOG) 0.1 % Apply 1 application topically 2 (two) times daily. (Patient taking differently: Apply 1 application  topically 2 (two) times daily as needed (irritation).) 454 g 0   No current facility-administered medications for this visit.    SURGICAL HISTORY:  Past Surgical History:  Procedure Laterality Date   CORONARY ANGIOPLASTY WITH STENT PLACEMENT     LEFT HEART CATH AND CORONARY ANGIOGRAPHY N/A 08/09/2018   Procedure: LEFT HEART CATH AND CORONARY ANGIOGRAPHY;  Surgeon: Yvonne Kendall, MD;  Location: [facility]ASIVE [facility];  Service: Cardiovascular;  Laterality: N/A;   LEFT HEART CATHETERIZATION WITH CORONARY ANGIOGRAM N/A 11/15/2012   Procedure: LEFT HEART CATHETERIZATION WITH CORONARY ANGIOGRAM;  Surgeon: Vesta Mixer, MD;  Location: [facility] CATH LAB;  Service: Cardiovascular;  Laterality: N/A;   PERCUTANEOUS CORONARY STENT INTERVENTION (PCI-S)  11/15/2012   Procedure: PERCUTANEOUS CORONARY STENT INTERVENTION (PCI-S);  Surgeon: Vesta Mixer, MD;  Location: [facility] CATH LAB;  Service: Cardiovascular;;   skin cancer removed right lower leg Right    TONSILLECTOMY     TOTAL HIP ARTHROPLASTY Right 01/16/2021   Procedure: TOTAL HIP ARTHROPLASTY ANTERIOR APPROACH;  Surgeon: Ollen Gross, MD;  Location: [facility];  Service: Orthopedics;  Laterality: Right;    uterus ablation     VULVECTOMY N/A 11/08/2019   Procedure: excision of VULVA, vulvoscopy;  Surgeon: Marcelle Overlie, MD;  Location: [facility] [place];  Service: Gynecology;  Laterality: N/A;    REVIEW OF SYSTEMS:   Constitutional: Negative for appetite change, chills, fatigue, fever and unexpected weight change.  HENT: Positive for nasal congestion.  Negative for mouth sores, nosebleeds, sore throat  and trouble swallowing.   Eyes: Negative for eye problems and icterus.  Respiratory: Positive for cough.  Negative for  hemoptysis, shortness of breath and wheezing.   Cardiovascular: Negative for chest pain and leg swelling.  Gastrointestinal:  Negative for abdominal pain, constipation, nausea and vomiting.  Genitourinary: Negative for bladder incontinence, difficulty urinating, dysuria, frequency and hematuria.   Musculoskeletal: Negative for back pain, gait problem, neck pain and neck stiffness.  Skin: Negative for itching and rash.  Neurological: Negative for dizziness, extremity weakness, gait problem, headaches, light-headedness and seizures.  Hematological: Negative for adenopathy. Does not bruise/bleed easily.  Psychiatric/Behavioral: Negative for confusion, depression and sleep disturbance. The patient is not  nervous/anxious.     PHYSICAL EXAMINATION:  Blood pressure 131/71, pulse 65, temperature 98 F (36.7 C), temperature source Oral, resp. rate 17, height 5' 4.5" (1.638 m), weight 254 lb (115.2 kg), SpO2 99%.  ECOG PERFORMANCE STATUS: 1  Physical Exam  Constitutional: Oriented to person, place, and time and well-developed, well-nourished, and in no distress.  HENT:  Head: Normocephalic and atraumatic.  Mouth/Throat: Oropharynx is clear and moist. No oropharyngeal exudate.  Eyes: Conjunctivae are normal. Right eye exhibits no discharge. Left eye exhibits no discharge. No scleral icterus.  Neck: Normal range of motion. Neck supple.  Cardiovascular: Normal rate, regular rhythm, normal heart sounds and intact distal pulses.   Pulmonary/Chest: Effort normal. Quiet breath sounds bilaterally. No respiratory distress. No wheezes. No rales.  Abdominal: Soft. Bowel sounds are normal. Exhibits no distension and no mass. There is no tenderness.  Musculoskeletal: Normal range of motion. Exhibits no edema.  Lymphadenopathy:    No cervical adenopathy.  Neurological: Alert and oriented to  person, place, and time. Exhibits normal muscle tone. Gait normal. Coordination normal.  Skin: Skin is warm and dry. No rash noted. Not diaphoretic. No erythema. No pallor.  Psychiatric: Mood, memory and judgment normal.  Vitals reviewed.  LABORATORY DATA: Lab Results  Component Value Date   WBC 7.0 05/19/2023   HGB 14.9 05/19/2023   HCT 44.5 05/19/2023   MCV 89.4 05/19/2023   PLT 154 05/19/2023      Chemistry      Component Value Date/Time   NA 138 05/19/2023 1317   K 4.2 05/19/2023 1317   CL 103 05/19/2023 1317   CO2 31 05/19/2023 1317   BUN 8 05/19/2023 1317   CREATININE 1.00 05/19/2023 1317   CREATININE 0.89 10/16/2021 1122      Component Value Date/Time   CALCIUM 9.4 05/19/2023 1317   ALKPHOS 67 05/19/2023 1317   AST 14 (L) 05/19/2023 1317   ALT 13 05/19/2023 1317   BILITOT 0.4 05/19/2023 1317       RADIOGRAPHIC STUDIES:  NM PET Image Initial (PI) Skull Base To Thigh  Result Date: 05/31/2023 CLINICAL DATA:  Initial treatment strategy for non-small cell lung cancer. EXAM: NUCLEAR MEDICINE PET SKULL BASE TO THIGH TECHNIQUE: 12.4 mCi F-18 FDG was injected intravenously. Full-ring PET imaging was performed from the skull base to thigh after the radiotracer. CT data was obtained and used for attenuation correction and anatomic localization. Fasting blood glucose: 124 mg/dl COMPARISON:  CTA chest dated 05/06/2023. MRI abdomen dated 10/30/2021. CT abdomen/pelvis dated 08/18/2021. FINDINGS: Mediastinal blood pool activity: SUV max 3.6 Liver activity: SUV max NA NECK: No hypermetabolic cervical lymphadenopathy. Incidental CT findings: None. CHEST: 2.5 x 1.1 cm lingular nodule along the left heart border, max SUV 26.6, compatible with primary bronchogenic carcinoma. No hypermetabolic thoracic lymphadenopathy. Incidental CT findings: Atherosclerotic calcifications of the aortic arch. Moderate three-vessel coronary atherosclerosis. ABDOMEN/PELVIS: No abnormal hypermetabolism in the  liver, spleen, pancreas, or adrenal glands. No hypermetabolic abdominopelvic lymphadenopathy. Incidental CT findings: Atherosclerotic calcifications of the abdominal aorta and branch vessels. SKELETON: No focal hypermetabolic activity to suggest skeletal metastasis. Incidental CT findings: Degenerative changes of the thoracic spine. Right hip arthroplasty. Degenerative changes of the left hip. IMPRESSION: 2.5 cm lingular nodule, compatible with primary bronchogenic carcinoma. No evidence of metastatic disease. Electronically Signed   By: Charline Bills M.D.   On: 05/31/2023 01:51     ASSESSMENT/PLAN:  This is a very pleasant 57 year old female with likely stage I (T1c, N0, M0) bronchogenic  carcinoma. She presented with a 2.5 cm lingular nodule.   The patient recently had a PET scan. The patient was seen with Dr. Arbutus Ped today.  Dr. Arbutus Ped personally and independently reviewed the scan and discussed results with the patient today.  The scan showed hypermetabolic activity in the lingular lesion. No other sites of disease identified.  Dr. Arbutus Ped recommends ordering Pfts and referral to CT surgery for consideration of surgical resection. Dr. Arbutus Ped feels the location may be difficult to bronchoscopy/biopsy  I have placed the referral to CT surgery. I also ordered PFTs. The patient was strongly encouraged to quit smoking. If the patient is not candidate for surgery, then would consider her for radiation.   We will see her back after surgery to discuss surveillance.   The patient was advised to call immediately if she has any concerning symptoms in the interval. The patient voices understanding of current disease status and treatment options and is in agreement with the current care plan. All questions were answered. The patient knows to call the clinic with any problems, questions or concerns. We can certainly see the patient much sooner if necessary No orders of the defined types were placed in this  encounter.     Yobany Vroom L Nate Common, PA-C 06/10/23  ADDENDUM: Hematology/Oncology Attending: I had a face-to-face encounter with the patient today.  I reviewed her records, lab, scan and recommended her care plan.  This is a very pleasant 57 years old white female with likely stage Ia (T1c, N0, M0) lung cancer presented with 2.5 central lingular nodule suspicious for primary bronchogenic carcinoma in August 2024.  The patient had a PET scan performed recently.  I personally and independently reviewed the PET scan images and discussed the result with the patient and her husband today. I strongly recommend for the patient to see cardiothoracic surgery for consideration of surgical resection of this tumor if the patient is a good surgical candidate.  The location of the tumor is very central and close to the heart border and this may be tricky for biopsy before surgery. I strongly recommend for the patient to quit smoking and also I will order PFTs before she sees cardiothoracic surgery.  The patient and her husband are in agreement with the current plan. We will see her back for follow-up visit after her surgical evaluation and if she is not a good surgical candidate, we will refer her to radiation oncology for SBRT but she may need a trial for a biopsy before the radiotherapy. She was advised to call immediately if she has any other concerning symptoms in the interval. The total time spent in the appointment was 30 minutes. Lajuana Matte, MD 06/10/23

## 2023-06-09 ENCOUNTER — Other Ambulatory Visit: Payer: Self-pay | Admitting: Physician Assistant

## 2023-06-09 DIAGNOSIS — C3490 Malignant neoplasm of unspecified part of unspecified bronchus or lung: Secondary | ICD-10-CM

## 2023-06-10 ENCOUNTER — Inpatient Hospital Stay: Payer: Medicare HMO | Attending: Physician Assistant

## 2023-06-10 ENCOUNTER — Inpatient Hospital Stay: Payer: Medicare HMO | Admitting: Physician Assistant

## 2023-06-10 VITALS — BP 131/71 | HR 65 | Temp 98.0°F | Resp 17 | Ht 64.5 in | Wt 254.0 lb

## 2023-06-10 DIAGNOSIS — R918 Other nonspecific abnormal finding of lung field: Secondary | ICD-10-CM | POA: Insufficient documentation

## 2023-06-10 DIAGNOSIS — F17200 Nicotine dependence, unspecified, uncomplicated: Secondary | ICD-10-CM | POA: Insufficient documentation

## 2023-06-10 DIAGNOSIS — C3490 Malignant neoplasm of unspecified part of unspecified bronchus or lung: Secondary | ICD-10-CM

## 2023-06-10 LAB — CMP (CANCER CENTER ONLY)
ALT: 18 U/L (ref 0–44)
AST: 14 U/L — ABNORMAL LOW (ref 15–41)
Albumin: 3.9 g/dL (ref 3.5–5.0)
Alkaline Phosphatase: 68 U/L (ref 38–126)
Anion gap: 6 (ref 5–15)
BUN: 7 mg/dL (ref 6–20)
CO2: 26 mmol/L (ref 22–32)
Calcium: 9.1 mg/dL (ref 8.9–10.3)
Chloride: 104 mmol/L (ref 98–111)
Creatinine: 0.88 mg/dL (ref 0.44–1.00)
GFR, Estimated: >60 mL/min (ref >60)
Glucose, Bld: 115 mg/dL — ABNORMAL HIGH (ref 70–99)
Potassium: 4 mmol/L (ref 3.5–5.1)
Sodium: 136 mmol/L (ref 135–145)
Total Bilirubin: 0.4 mg/dL (ref 0.3–1.2)
Total Protein: 7.1 g/dL (ref 6.5–8.1)

## 2023-06-10 LAB — CBC WITH DIFFERENTIAL (CANCER CENTER ONLY)
Abs Immature Granulocytes: 0.02 10*3/uL (ref 0.00–0.07)
Basophils Absolute: 0 10*3/uL (ref 0.0–0.1)
Basophils Relative: 0 %
Eosinophils Absolute: 0.1 10*3/uL (ref 0.0–0.5)
Eosinophils Relative: 1 %
HCT: 43 % (ref 36.0–46.0)
Hemoglobin: 14.4 g/dL (ref 12.0–15.0)
Immature Granulocytes: 0 %
Lymphocytes Relative: 29 %
Lymphs Abs: 2 10*3/uL (ref 0.7–4.0)
MCH: 29.3 pg (ref 26.0–34.0)
MCHC: 33.5 g/dL (ref 30.0–36.0)
MCV: 87.4 fL (ref 80.0–100.0)
Monocytes Absolute: 0.6 10*3/uL (ref 0.1–1.0)
Monocytes Relative: 8 %
Neutro Abs: 4.2 10*3/uL (ref 1.7–7.7)
Neutrophils Relative %: 62 %
Platelet Count: 151 10*3/uL (ref 150–400)
RBC: 4.92 MIL/uL (ref 3.87–5.11)
RDW: 15.1 % (ref 11.5–15.5)
WBC Count: 6.9 10*3/uL (ref 4.0–10.5)
nRBC: 0 % (ref 0.0–0.2)

## 2023-06-10 NOTE — Progress Notes (Signed)
The plan for this pt is for PFTs and referral to surgery. Email sent to [facility] Ruthell Rummage and Lorinda Creed for PFT scheduling and I sent an IB message to CT surgery to notify staff nurse of referral.

## 2023-07-06 ENCOUNTER — Encounter: Payer: Medicare HMO | Admitting: Thoracic Surgery (Cardiothoracic Vascular Surgery)

## 2023-07-06 ENCOUNTER — Encounter (HOSPITAL_COMMUNITY): Payer: Medicare HMO

## 2023-07-07 ENCOUNTER — Ambulatory Visit: Payer: Medicare HMO | Admitting: Student

## 2023-07-13 ENCOUNTER — Encounter: Payer: Self-pay | Admitting: Thoracic Surgery (Cardiothoracic Vascular Surgery)

## 2023-07-13 ENCOUNTER — Ambulatory Visit (HOSPITAL_COMMUNITY)
Admission: RE | Admit: 2023-07-13 | Discharge: 2023-07-13 | Disposition: A | Discharge status: Home / Self Care | Payer: Medicare HMO | Source: Ambulatory Visit | Attending: Physician Assistant | Admitting: Physician Assistant

## 2023-07-13 ENCOUNTER — Institutional Professional Consult (permissible substitution): Payer: Medicare HMO | Admitting: Thoracic Surgery (Cardiothoracic Vascular Surgery)

## 2023-07-13 VITALS — BP 132/86 | HR 72 | Resp 18 | Ht 64.0 in | Wt 250.0 lb

## 2023-07-13 DIAGNOSIS — R918 Other nonspecific abnormal finding of lung field: Secondary | ICD-10-CM

## 2023-07-13 LAB — PULMONARY FUNCTION TEST
DL/VA % pred: 84 %
DL/VA: 3.54 ml/min/mmHg/L
DLCO unc % pred: 85 %
DLCO unc: 17.58 ml/min/mmHg
FEF 25-75 Post: 2.06 L/s
FEF 25-75 Pre: 1.88 L/s
FEF2575-%Change-Post: 9 %
FEF2575-%Pred-Post: 85 %
FEF2575-%Pred-Pre: 77 %
FEV1-%Change-Post: 5 %
FEV1-%Pred-Post: 86 %
FEV1-%Pred-Pre: 82 %
FEV1-Post: 2.27 L
FEV1-Pre: 2.16 L
FEV1FVC-%Change-Post: 1 %
FEV1FVC-%Pred-Pre: 94 %
FEV6-%Change-Post: 2 %
FEV6-%Pred-Post: 91 %
FEV6-%Pred-Pre: 89 %
FEV6-Post: 2.99 L
FEV6-Pre: 2.92 L
FEV6FVC-%Pred-Post: 103 %
FEV6FVC-%Pred-Pre: 103 %
FVC-%Change-Post: 3 %
FVC-%Pred-Post: 88 %
FVC-%Pred-Pre: 85 %
FVC-Post: 3.01 L
FVC-Pre: 2.92 L
Post FEV1/FVC ratio: 76 %
Post FEV6/FVC ratio: 100 %
Pre FEV1/FVC ratio: 74 %
Pre FEV6/FVC Ratio: 100 %

## 2023-07-13 MED ORDER — ALBUTEROL SULFATE (2.5 MG/3ML) 0.083% IN NEBU
2.5000 mg | INHALATION_SOLUTION | Freq: Once | RESPIRATORY_TRACT | Status: AC
  Administered 2023-07-13: 2.5 mg via RESPIRATORY_TRACT

## 2023-07-13 NOTE — Progress Notes (Signed)
PCP is Verl Blalock Referring Provider is Heilingoetter, Software engineer*  Chief Complaint  Patient presents with   Lung Mass    PET 8/27, Chest CTA 8/7, PFTS today    HPI: Shannon [name] is sent for consultation regarding a lingular nodule.  Shannon [name]'s symptoms  Shannon [name] was found to have a nodule in the lingular segment of the left upper lobe a couple of years ago.  It was monitored with little change initially.  In early August 2024 Shannon [name] had an episode of chest pain and went to the emergency room.  Shannon [name] had a CT angiogram.  There was no pulmonary embolus.  However the lingular nodule had grown in comparison to her prior films.  Shannon [name] ruled out for MI.  Shannon [name] saw Dr. Arbutus Ped.  A PET/CT was done which showed the nodule was markedly hypermetabolic.  There was no evidence of regional or distant metastatic disease.  Shannon [name] has not had any additional chest pain since that incident.  Activity is limited due to hip and back pain.  Shannon [name] says Shannon [name] did walk up the 4 flights of stairs to our office because Shannon [name] is claustrophobic and did not want to get on an elevator.  Shannon [name] was short of breath at the top.  Shannon [name] can walk 1 flight without stopping.  No change in appetite or weight loss.  No unusual headaches or visual changes.  Zubrod Score: At the time of surgery this patient's most appropriate activity status/level should be described as: []     0    Normal activity, no symptoms [x]     1    Restricted in physical strenuous activity but ambulatory, able to do out light work []     2    Ambulatory and capable of self care, unable to do work activities, up and about >50 % of waking hours                              []     3    Only limited self care, in bed greater than 50% of waking hours []     4    Completely disabled, no self care, confined to bed or chair []     5    Moribund  Past Medical History:  Diagnosis Date   Anxiety    Arthritis    oa needs hip replacement on right   Asthma    allergies    Cancer (HCC)     squamous cell areas removed from vulva and pre caner areas removed from leg    Carotid artery occlusion    Complication of anesthesia    major depression after general anesthesia   Coronary artery disease    LHC 11/25/12 90% stenosis mid LCx & otherwise nonobstructive dz w/ EF 60-65% S/p PTCA/DES to LCx   Depression    Dyspnea    with exertion    Elevated cholesterol    Exertional dyspnea    chronic   Fibromyalgia    GERD (gastroesophageal reflux disease)    History of cardiovascular stress test 06/2015   low risk   HPV in female    Hyperlipidemia    Hypertension    Liver abscess 09/09/2021   MI (myocardial infarction) (HCC) fe 17, 2014   Obesity    Pneumonia    hx of x 2 years ago    Polycystic ovary disease    Skin tear of upper arm without complication, left, sequela  small skin tear left upper arm no drainage for 1 week   Sleep apnea    mild no cpap    Tobacco abuse     Past Surgical History:  Procedure Laterality Date   CORONARY ANGIOPLASTY WITH STENT PLACEMENT     LEFT HEART CATH AND CORONARY ANGIOGRAPHY N/A 08/09/2018   Procedure: LEFT HEART CATH AND CORONARY ANGIOGRAPHY;  Surgeon: Yvonne Kendall, MD;  Location: [facility];  Service: Cardiovascular;  Laterality: N/A;   LEFT HEART CATHETERIZATION WITH CORONARY ANGIOGRAM N/A 11/15/2012   Procedure: LEFT HEART CATHETERIZATION WITH CORONARY ANGIOGRAM;  Surgeon: Vesta Mixer, MD;  Location: [facility] CATH LAB;  Service: Cardiovascular;  Laterality: N/A;   PERCUTANEOUS CORONARY STENT INTERVENTION (PCI-S)  11/15/2012   Procedure: PERCUTANEOUS CORONARY STENT INTERVENTION (PCI-S);  Surgeon: Vesta Mixer, MD;  Location: [facility] CATH LAB;  Service: Cardiovascular;;   skin cancer removed right lower leg Right    TONSILLECTOMY     TOTAL HIP ARTHROPLASTY Right 01/16/2021   Procedure: TOTAL HIP ARTHROPLASTY ANTERIOR APPROACH;  Surgeon: Ollen Gross, MD;  Location: [facility];  Service: Orthopedics;  Laterality: Right;     uterus ablation     VULVECTOMY N/A 11/08/2019   Procedure: excision of VULVA, vulvoscopy;  Surgeon: Marcelle Overlie, MD;  Location: [facility];  Service: Gynecology;  Laterality: N/A;    Family History  Problem Relation Age of Onset   Depression Mother    Anxiety disorder Maternal Grandmother    Anxiety disorder Paternal Grandmother    OCD Paternal Grandmother    Depression Paternal Grandmother    Heart attack Father        Deceased, 55   Cerebral aneurysm Father    Heart disease Father     Social History Social History   Tobacco Use   Smoking status: Every Day    Current packs/day: 1.50    Average packs/day: 1.5 packs/day for 44.3 years (66.4 ttl pk-yrs)    Types: Cigarettes    Start date: 03/29/1979   Smokeless tobacco: Current   Tobacco comments:    Currently 2ppd  Vaping Use   Vaping status: Never Used  Substance Use Topics   Alcohol use: Not Currently    Comment: rare   Drug use: No    Current Outpatient Medications  Medication Sig Dispense Refill   albuterol (PROVENTIL HFA;VENTOLIN HFA) 108 (90 Base) MCG/ACT inhaler Inhale 2 puffs into the lungs every 4 (four) hours as needed for wheezing or shortness of breath. 1 Inhaler 0   ALPRAZolam (XANAX) 1 MG tablet Take 1 mg by mouth 2 (two) times daily as needed for anxiety.  2   amoxicillin-clavulanate (AUGMENTIN) 875-125 MG tablet Take 1 tablet by mouth every 12 (twelve) hours. 14 tablet 0   aspirin EC 81 MG tablet Take 1 tablet (81 mg total) by mouth daily. Swallow whole. 30 tablet 11   bisoprolol-hydrochlorothiazide (ZIAC) 5-6.25 MG tablet Take 1 tablet by mouth daily. Pt takes in the pm     carbamide peroxide (DEBROX) 6.5 % OTIC solution Place 5 drops into both ears 2 (two) times daily. 15 mL 0   escitalopram (LEXAPRO) 20 MG tablet Take 20 mg by mouth at bedtime. Takes in the pm     fluticasone (FLONASE) 50 MCG/ACT nasal spray Place 2 sprays into both nostrils at bedtime.     furosemide (LASIX) 20  MG tablet Take 1 tablet (20 mg total) by mouth daily as needed. 90 tablet 3   nitroGLYCERIN (  NITROSTAT) 0.4 MG SL tablet Place 0.4 mg under the tongue every 5 (five) minutes as needed for chest pain.     oxyCODONE (OXY IR/ROXICODONE) 5 MG immediate release tablet Take 1 tablet (5 mg total) by mouth every 6 (six) hours as needed for severe pain or moderate pain. 15 tablet 0   pantoprazole (PROTONIX) 40 MG tablet Take 40 mg by mouth daily.     rosuvastatin (CRESTOR) 10 MG tablet Take 10 mg by mouth at bedtime.     triamcinolone cream (KENALOG) 0.1 % Apply 1 application topically 2 (two) times daily. (Patient taking differently: Apply 1 application  topically 2 (two) times daily as needed (irritation).) 454 g 0   No current facility-administered medications for this visit.    Allergies  Allergen Reactions   Clomiphene Citrate Nausea And Vomiting   Doxycycline Other (See Comments)   Morphine And Codeine Other (See Comments)    PATIENT REFUSES THIS MEDICATION: states that Shannon [name] does not tolerate this medication well   Other     General anesthesia causes depressive symptoms    Sulfa Antibiotics Other (See Comments)    Not sure ? rash   Clomiphene Rash    Review of Systems  Constitutional:  Negative for appetite change, fatigue and unexpected weight change.  HENT:  Positive for dental problem (Dentures). Negative for trouble swallowing and voice change.   Eyes:  Negative for visual disturbance.  Respiratory:  Positive for cough, chest tightness, shortness of breath and wheezing (With bronchitis).   Cardiovascular:  Positive for chest pain and leg swelling.  Gastrointestinal:  Positive for diarrhea.  Genitourinary:  Positive for frequency. Negative for difficulty urinating.  Musculoskeletal:  Positive for arthralgias, back pain, gait problem, joint swelling and myalgias.  Neurological:  Negative for seizures and weakness.  Psychiatric/Behavioral:  Positive for dysphoric mood. The patient is  nervous/anxious.     BP 132/86 (BP Location: Left Arm, Patient Position: Sitting)   Pulse 72   Resp 18   Ht 5\' 4"  (1.626 m)   Wt 250 lb (113.4 kg)   SpO2 97% Comment: RA  BMI 42.91 kg/m  Physical Exam Vitals reviewed.  Constitutional:      General: Shannon [name] is not in acute distress.    Appearance: Shannon [name] is obese.  HENT:     Head: Normocephalic and atraumatic.  Eyes:     General: No scleral icterus.    Extraocular Movements: Extraocular movements intact.  Cardiovascular:     Rate and Rhythm: Normal rate and regular rhythm.     Heart sounds: Normal heart sounds. No murmur heard. Pulmonary:     Effort: Pulmonary effort is normal.     Comments: Diminished breath sounds bilaterally Abdominal:     General: There is no distension.     Palpations: Abdomen is soft.     Tenderness: There is no abdominal tenderness.  Lymphadenopathy:     Cervical: No cervical adenopathy.  Skin:    General: Skin is warm and dry.  Neurological:     General: No focal deficit present.     Mental Status: Shannon [name] is alert and oriented to person, place, and time.     Cranial Nerves: No cranial nerve deficit.     Motor: No weakness.    Diagnostic Tests: NUCLEAR MEDICINE PET SKULL BASE TO THIGH   TECHNIQUE: 12.4 mCi F-18 FDG was injected intravenously. Full-ring PET imaging was performed from the skull base to thigh after the radiotracer. CT data was obtained and used for  attenuation correction and anatomic localization.   Fasting blood glucose: 124 mg/dl   COMPARISON:  CTA chest dated 05/06/2023. MRI abdomen dated 10/30/2021. CT abdomen/pelvis dated 08/18/2021.   FINDINGS: Mediastinal blood pool activity: SUV max 3.6   Liver activity: SUV max NA   NECK: No hypermetabolic cervical lymphadenopathy.   Incidental CT findings: None.   CHEST: 2.5 x 1.1 cm lingular nodule along the left heart border, max SUV 26.6, compatible with primary bronchogenic carcinoma.   No hypermetabolic thoracic  lymphadenopathy.   Incidental CT findings: Atherosclerotic calcifications of the aortic arch. Moderate three-vessel coronary atherosclerosis.   ABDOMEN/PELVIS: No abnormal hypermetabolism in the liver, spleen, pancreas, or adrenal glands.   No hypermetabolic abdominopelvic lymphadenopathy.   Incidental CT findings: Atherosclerotic calcifications of the abdominal aorta and branch vessels.   SKELETON: No focal hypermetabolic activity to suggest skeletal metastasis.   Incidental CT findings: Degenerative changes of the thoracic spine. Right hip arthroplasty. Degenerative changes of the left hip.   IMPRESSION: 2.5 cm lingular nodule, compatible with primary bronchogenic carcinoma.   No evidence of metastatic disease.     Electronically Signed   By: Charline Bills M.D.   On: 05/31/2023 01:51   I personally reviewed the PET/CT images.  There is a 2.5 x 1.1 cm nodule in the lingula adjacent to the heart that  is markedly hypermetabolic.  No hilar or mediastinal adenopathy.  No evidence of regional or distant metastases.  Extensive coronary calcification.  Thoracic aortic atherosclerosis.  Pulmonary function testing 07/13/2023 FVC 2.92 (85%) FEV1 2.16 (82%) FEV1 2.27 (86%) postbronchodilator DLCO 17.58 (85%)  Impression: Shannon [name] is a 57 year old woman with a past medical history significant for tobacco abuse, coronary disease, MI, previous stent, carotid artery occlusion, reflux, fibromyalgia, chronic pain, osteoarthritis, hypertension, hyperlipidemia, obesity, liver abscess, sleep apnea, anxiety, depression, and a left upper lobe lung nodule.  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  Carcinoid tumor and infectious and inflammatory nodules are in the differential but are far less likely.  Given the interval growth and significant activity on PET, I do not think a biopsy is necessary prior to proceeding with surgical resection.  In fact if it were  negative I would not trust the results.  We did discuss treatment options for the nodule including surgery and stereotactic radiation.  We reviewed the relative advantages and disadvantages of each approach.  Shannon [name] strongly prefers surgical resection.  The nodule is favorable for lingular segmentectomy.  I informed her of the general nature of robotic lingular segmentectomy including the need for general anesthesia, the incisions to be used, the use of the surgical robot, the use of a drainage tube postoperatively, the expected hospital stay, and the overall recovery.  Shannon [name] understands that we will plan to do a segmentectomy but it is possible that a lobectomy would be necessary.  I informed her of the indications, risks, benefits, and alternatives.  Shannon [name] understands the risks include, but not limited to death, MI, DVT, PE, bleeding, possible need for transfusion, infection, prolonged air leak, cardiac arrhythmias, as well as the possibility of other unforeseeable complications.  Shannon [name] is willing to accept the risks  CAD/thoracic aortic atherosclerosis-on aspirin and rosuvastatin.  Ruled out for MI with a recent episode of chest pain.  However, her symptoms were suspicious.  Has known CAD with a previous stent and MI.  Needs cardiology clearance prior to surgery.  Shannon [name] has an appointment with cardiology in December we will contact them and see if we can  move that up.  Tobacco abuse-smokes 2 packs/day.  Emphasized multiple times the importance of smoking cessation.  Unfortunately despite her premature atherosclerotic disease with an MI and carotid artery occlusion Shannon [name] continues to smoke.  Plan: Quit smoking Cardiology clearance Robotic assisted left upper lobe lingular segmentectomy  Loreli Slot, MD [facility] [phone]

## 2023-07-13 NOTE — H&P (View-Only) (Signed)
PCP is Verl Blalock Referring Provider is Heilingoetter, Software engineer*  Chief Complaint  Patient presents with   Lung Mass    PET 8/27, Chest CTA 8/7, PFTS today    HPI: Mrs. Bibbins is sent for consultation regarding a lingular nodule.  Skains's symptoms  She was found to have a nodule in the lingular segment of the left upper lobe a couple of years ago.  It was monitored with little change initially.  In early August 2024 she had an episode of chest pain and went to the emergency room.  She had a CT angiogram.  There was no pulmonary embolus.  However the lingular nodule had grown in comparison to her prior films.  She ruled out for MI.  She saw Dr. Arbutus Ped.  A PET/CT was done which showed the nodule was markedly hypermetabolic.  There was no evidence of regional or distant metastatic disease.  She has not had any additional chest pain since that incident.  Activity is limited due to hip and back pain.  She says she did walk up the 4 flights of stairs to our office because she is claustrophobic and did not want to get on an elevator.  She was short of breath at the top.  She can walk 1 flight without stopping.  No change in appetite or weight loss.  No unusual headaches or visual changes.  Zubrod Score: At the time of surgery this patient's most appropriate activity status/level should be described as: []     0    Normal activity, no symptoms [x]     1    Restricted in physical strenuous activity but ambulatory, able to do out light work []     2    Ambulatory and capable of self care, unable to do work activities, up and about >50 % of waking hours                              []     3    Only limited self care, in bed greater than 50% of waking hours []     4    Completely disabled, no self care, confined to bed or chair []     5    Moribund  Past Medical History:  Diagnosis Date   Anxiety    Arthritis    oa needs hip replacement on right   Asthma    allergies    Cancer (HCC)     squamous cell areas removed from vulva and pre caner areas removed from leg    Carotid artery occlusion    Complication of anesthesia    major depression after general anesthesia   Coronary artery disease    LHC 11/25/12 90% stenosis mid LCx & otherwise nonobstructive dz w/ EF 60-65% S/p PTCA/DES to LCx   Depression    Dyspnea    with exertion    Elevated cholesterol    Exertional dyspnea    chronic   Fibromyalgia    GERD (gastroesophageal reflux disease)    History of cardiovascular stress test 06/2015   low risk   HPV in female    Hyperlipidemia    Hypertension    Liver abscess 09/09/2021   MI (myocardial infarction) (HCC) fe 17, 2014   Obesity    Pneumonia    hx of x 2 years ago    Polycystic ovary disease    Skin tear of upper arm without complication, left, sequela  small skin tear left upper arm no drainage for 1 week   Sleep apnea    mild no cpap    Tobacco abuse     Past Surgical History:  Procedure Laterality Date   CORONARY ANGIOPLASTY WITH STENT PLACEMENT     LEFT HEART CATH AND CORONARY ANGIOGRAPHY N/A 08/09/2018   Procedure: LEFT HEART CATH AND CORONARY ANGIOGRAPHY;  Surgeon: Yvonne Kendall, MD;  Location: [facility];  Service: Cardiovascular;  Laterality: N/A;   LEFT HEART CATHETERIZATION WITH CORONARY ANGIOGRAM N/A 11/15/2012   Procedure: LEFT HEART CATHETERIZATION WITH CORONARY ANGIOGRAM;  Surgeon: Vesta Mixer, MD;  Location: [facility] CATH LAB;  Service: Cardiovascular;  Laterality: N/A;   PERCUTANEOUS CORONARY STENT INTERVENTION (PCI-S)  11/15/2012   Procedure: PERCUTANEOUS CORONARY STENT INTERVENTION (PCI-S);  Surgeon: Vesta Mixer, MD;  Location: [facility] CATH LAB;  Service: Cardiovascular;;   skin cancer removed right lower leg Right    TONSILLECTOMY     TOTAL HIP ARTHROPLASTY Right 01/16/2021   Procedure: TOTAL HIP ARTHROPLASTY ANTERIOR APPROACH;  Surgeon: Ollen Gross, MD;  Location: [facility];  Service: Orthopedics;  Laterality: Right;     uterus ablation     VULVECTOMY N/A 11/08/2019   Procedure: excision of VULVA, vulvoscopy;  Surgeon: Marcelle Overlie, MD;  Location: [facility];  Service: Gynecology;  Laterality: N/A;    Family History  Problem Relation Age of Onset   Depression Mother    Anxiety disorder Maternal Grandmother    Anxiety disorder Paternal Grandmother    OCD Paternal Grandmother    Depression Paternal Grandmother    Heart attack Father        Deceased, 55   Cerebral aneurysm Father    Heart disease Father     Social History Social History   Tobacco Use   Smoking status: Every Day    Current packs/day: 1.50    Average packs/day: 1.5 packs/day for 44.3 years (66.4 ttl pk-yrs)    Types: Cigarettes    Start date: 03/29/1979   Smokeless tobacco: Current   Tobacco comments:    Currently 2ppd  Vaping Use   Vaping status: Never Used  Substance Use Topics   Alcohol use: Not Currently    Comment: rare   Drug use: No    Current Outpatient Medications  Medication Sig Dispense Refill   albuterol (PROVENTIL HFA;VENTOLIN HFA) 108 (90 Base) MCG/ACT inhaler Inhale 2 puffs into the lungs every 4 (four) hours as needed for wheezing or shortness of breath. 1 Inhaler 0   ALPRAZolam (XANAX) 1 MG tablet Take 1 mg by mouth 2 (two) times daily as needed for anxiety.  2   amoxicillin-clavulanate (AUGMENTIN) 875-125 MG tablet Take 1 tablet by mouth every 12 (twelve) hours. 14 tablet 0   aspirin EC 81 MG tablet Take 1 tablet (81 mg total) by mouth daily. Swallow whole. 30 tablet 11   bisoprolol-hydrochlorothiazide (ZIAC) 5-6.25 MG tablet Take 1 tablet by mouth daily. Pt takes in the pm     carbamide peroxide (DEBROX) 6.5 % OTIC solution Place 5 drops into both ears 2 (two) times daily. 15 mL 0   escitalopram (LEXAPRO) 20 MG tablet Take 20 mg by mouth at bedtime. Takes in the pm     fluticasone (FLONASE) 50 MCG/ACT nasal spray Place 2 sprays into both nostrils at bedtime.     furosemide (LASIX) 20  MG tablet Take 1 tablet (20 mg total) by mouth daily as needed. 90 tablet 3   nitroGLYCERIN (  NITROSTAT) 0.4 MG SL tablet Place 0.4 mg under the tongue every 5 (five) minutes as needed for chest pain.     oxyCODONE (OXY IR/ROXICODONE) 5 MG immediate release tablet Take 1 tablet (5 mg total) by mouth every 6 (six) hours as needed for severe pain or moderate pain. 15 tablet 0   pantoprazole (PROTONIX) 40 MG tablet Take 40 mg by mouth daily.     rosuvastatin (CRESTOR) 10 MG tablet Take 10 mg by mouth at bedtime.     triamcinolone cream (KENALOG) 0.1 % Apply 1 application topically 2 (two) times daily. (Patient taking differently: Apply 1 application  topically 2 (two) times daily as needed (irritation).) 454 g 0   No current facility-administered medications for this visit.    Allergies  Allergen Reactions   Clomiphene Citrate Nausea And Vomiting   Doxycycline Other (See Comments)   Morphine And Codeine Other (See Comments)    PATIENT REFUSES THIS MEDICATION: states that she does not tolerate this medication well   Other     General anesthesia causes depressive symptoms    Sulfa Antibiotics Other (See Comments)    Not sure ? rash   Clomiphene Rash    Review of Systems  Constitutional:  Negative for appetite change, fatigue and unexpected weight change.  HENT:  Positive for dental problem (Dentures). Negative for trouble swallowing and voice change.   Eyes:  Negative for visual disturbance.  Respiratory:  Positive for cough, chest tightness, shortness of breath and wheezing (With bronchitis).   Cardiovascular:  Positive for chest pain and leg swelling.  Gastrointestinal:  Positive for diarrhea.  Genitourinary:  Positive for frequency. Negative for difficulty urinating.  Musculoskeletal:  Positive for arthralgias, back pain, gait problem, joint swelling and myalgias.  Neurological:  Negative for seizures and weakness.  Psychiatric/Behavioral:  Positive for dysphoric mood. The patient is  nervous/anxious.     BP 132/86 (BP Location: Left Arm, Patient Position: Sitting)   Pulse 72   Resp 18   Ht 5\' 4"  (1.626 m)   Wt 250 lb (113.4 kg)   SpO2 97% Comment: RA  BMI 42.91 kg/m  Physical Exam Vitals reviewed.  Constitutional:      General: She is not in acute distress.    Appearance: She is obese.  HENT:     Head: Normocephalic and atraumatic.  Eyes:     General: No scleral icterus.    Extraocular Movements: Extraocular movements intact.  Cardiovascular:     Rate and Rhythm: Normal rate and regular rhythm.     Heart sounds: Normal heart sounds. No murmur heard. Pulmonary:     Effort: Pulmonary effort is normal.     Comments: Diminished breath sounds bilaterally Abdominal:     General: There is no distension.     Palpations: Abdomen is soft.     Tenderness: There is no abdominal tenderness.  Lymphadenopathy:     Cervical: No cervical adenopathy.  Skin:    General: Skin is warm and dry.  Neurological:     General: No focal deficit present.     Mental Status: She is alert and oriented to person, place, and time.     Cranial Nerves: No cranial nerve deficit.     Motor: No weakness.    Diagnostic Tests: NUCLEAR MEDICINE PET SKULL BASE TO THIGH   TECHNIQUE: 12.4 mCi F-18 FDG was injected intravenously. Full-ring PET imaging was performed from the skull base to thigh after the radiotracer. CT data was obtained and used for  attenuation correction and anatomic localization.   Fasting blood glucose: 124 mg/dl   COMPARISON:  CTA chest dated 05/06/2023. MRI abdomen dated 10/30/2021. CT abdomen/pelvis dated 08/18/2021.   FINDINGS: Mediastinal blood pool activity: SUV max 3.6   Liver activity: SUV max NA   NECK: No hypermetabolic cervical lymphadenopathy.   Incidental CT findings: None.   CHEST: 2.5 x 1.1 cm lingular nodule along the left heart border, max SUV 26.6, compatible with primary bronchogenic carcinoma.   No hypermetabolic thoracic  lymphadenopathy.   Incidental CT findings: Atherosclerotic calcifications of the aortic arch. Moderate three-vessel coronary atherosclerosis.   ABDOMEN/PELVIS: No abnormal hypermetabolism in the liver, spleen, pancreas, or adrenal glands.   No hypermetabolic abdominopelvic lymphadenopathy.   Incidental CT findings: Atherosclerotic calcifications of the abdominal aorta and branch vessels.   SKELETON: No focal hypermetabolic activity to suggest skeletal metastasis.   Incidental CT findings: Degenerative changes of the thoracic spine. Right hip arthroplasty. Degenerative changes of the left hip.   IMPRESSION: 2.5 cm lingular nodule, compatible with primary bronchogenic carcinoma.   No evidence of metastatic disease.     Electronically Signed   By: Charline Bills M.D.   On: 05/31/2023 01:51   I personally reviewed the PET/CT images.  There is a 2.5 x 1.1 cm nodule in the lingula adjacent to the heart that  is markedly hypermetabolic.  No hilar or mediastinal adenopathy.  No evidence of regional or distant metastases.  Extensive coronary calcification.  Thoracic aortic atherosclerosis.  Pulmonary function testing 07/13/2023 FVC 2.92 (85%) FEV1 2.16 (82%) FEV1 2.27 (86%) postbronchodilator DLCO 17.58 (85%)  Impression: Beryle Pixley is a 57 year old woman with a past medical history significant for tobacco abuse, coronary disease, MI, previous stent, carotid artery occlusion, reflux, fibromyalgia, chronic pain, osteoarthritis, hypertension, hyperlipidemia, obesity, liver abscess, sleep apnea, anxiety, depression, and a left upper lobe lung nodule.  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  Carcinoid tumor and infectious and inflammatory nodules are in the differential but are far less likely.  Given the interval growth and significant activity on PET, I do not think a biopsy is necessary prior to proceeding with surgical resection.  In fact if it were  negative I would not trust the results.  We did discuss treatment options for the nodule including surgery and stereotactic radiation.  We reviewed the relative advantages and disadvantages of each approach.  She strongly prefers surgical resection.  The nodule is favorable for lingular segmentectomy.  I informed her of the general nature of robotic lingular segmentectomy including the need for general anesthesia, the incisions to be used, the use of the surgical robot, the use of a drainage tube postoperatively, the expected hospital stay, and the overall recovery.  She understands that we will plan to do a segmentectomy but it is possible that a lobectomy would be necessary.  I informed her of the indications, risks, benefits, and alternatives.  She understands the risks include, but not limited to death, MI, DVT, PE, bleeding, possible need for transfusion, infection, prolonged air leak, cardiac arrhythmias, as well as the possibility of other unforeseeable complications.  She is willing to accept the risks  CAD/thoracic aortic atherosclerosis-on aspirin and rosuvastatin.  Ruled out for MI with a recent episode of chest pain.  However, her symptoms were suspicious.  Has known CAD with a previous stent and MI.  Needs cardiology clearance prior to surgery.  She has an appointment with cardiology in December we will contact them and see if we can  move that up.  Tobacco abuse-smokes 2 packs/day.  Emphasized multiple times the importance of smoking cessation.  Unfortunately despite her premature atherosclerotic disease with an MI and carotid artery occlusion she continues to smoke.  Plan: Quit smoking Cardiology clearance Robotic assisted left upper lobe lingular segmentectomy  Loreli Slot, MD [facility] [phone]

## 2023-07-15 ENCOUNTER — Ambulatory Visit: Payer: Medicare HMO | Admitting: Internal Medicine

## 2023-07-22 DIAGNOSIS — F332 Major depressive disorder, recurrent severe without psychotic features: Secondary | ICD-10-CM | POA: Diagnosis not present

## 2023-07-22 DIAGNOSIS — C3490 Malignant neoplasm of unspecified part of unspecified bronchus or lung: Secondary | ICD-10-CM | POA: Diagnosis not present

## 2023-07-22 DIAGNOSIS — F411 Generalized anxiety disorder: Secondary | ICD-10-CM | POA: Diagnosis not present

## 2023-07-23 ENCOUNTER — Ambulatory Visit: Payer: Medicare HMO | Attending: Internal Medicine | Admitting: Internal Medicine

## 2023-07-23 ENCOUNTER — Telehealth: Payer: Self-pay | Admitting: Internal Medicine

## 2023-07-23 ENCOUNTER — Encounter: Payer: Self-pay | Admitting: Internal Medicine

## 2023-07-23 VITALS — BP 138/80 | HR 60 | Ht 64.5 in | Wt 251.4 lb

## 2023-07-23 DIAGNOSIS — Z01810 Encounter for preprocedural cardiovascular examination: Secondary | ICD-10-CM

## 2023-07-23 NOTE — Telephone Encounter (Signed)
Checking percert on the following patient for testing scheduled at [facility]    LEXISCAN   07/28/2023

## 2023-07-23 NOTE — Progress Notes (Addendum)
Cardiology Office Note  Date: 07/23/2023   ID: Shalecia, Hoop Jun 28, 1966, MRN [id number]  PCP:  [name], PA-C  Cardiologist:  Marjo Bicker, MD Electrophysiologist:  None   History of Present Illness: Shannon [name] is a 57 y.o. female  Diagnosed with lung cancer, will need to be scheduled for lung surgery sometime in the first week of November 2024.  Overall doing great, no chest pains with exertion.  Has stable SOB.  No orthopnea, PND, leg swelling.  No dizziness, syncope, presyncope.  Past Medical History:  Diagnosis Date   Anxiety    Arthritis    oa needs hip replacement on right   Asthma    allergies    Cancer (HCC)    squamous cell areas removed from vulva and pre caner areas removed from leg    Carotid artery occlusion    Complication of anesthesia    major depression after general anesthesia   Coronary artery disease    LHC 11/25/12 90% stenosis mid LCx & otherwise nonobstructive dz w/ EF 60-65% S/p PTCA/DES to LCx   Depression    Dyspnea    with exertion    Elevated cholesterol    Exertional dyspnea    chronic   Fibromyalgia    GERD (gastroesophageal reflux disease)    History of cardiovascular stress test 06/2015   low risk   HPV in female    Hyperlipidemia    Hypertension    Liver abscess 09/09/2021   MI (myocardial infarction) (HCC) fe 17, 2014   Obesity    Pneumonia    hx of x 2 years ago    Polycystic ovary disease    Skin tear of upper arm without complication, left, sequela    small skin tear left upper arm no drainage for 1 week   Sleep apnea    mild no cpap    Tobacco abuse     Past Surgical History:  Procedure Laterality Date   CORONARY ANGIOPLASTY WITH STENT PLACEMENT     LEFT HEART CATH AND CORONARY ANGIOGRAPHY N/A 08/09/2018   Procedure: LEFT HEART CATH AND CORONARY ANGIOGRAPHY;  Surgeon: Yvonne Kendall, MD;  Location: [facility] INVASIVE CV LAB;  Service: Cardiovascular;  Laterality: N/A;   LEFT HEART CATHETERIZATION  WITH CORONARY ANGIOGRAM N/A 11/15/2012   Procedure: LEFT HEART CATHETERIZATION WITH CORONARY ANGIOGRAM;  Surgeon: Vesta Mixer, MD;  Location: [facility] CATH LAB;  Service: Cardiovascular;  Laterality: N/A;   PERCUTANEOUS CORONARY STENT INTERVENTION (PCI-S)  11/15/2012   Procedure: PERCUTANEOUS CORONARY STENT INTERVENTION (PCI-S);  Surgeon: Vesta Mixer, MD;  Location: [facility] CATH LAB;  Service: Cardiovascular;;   skin cancer removed right lower leg Right    TONSILLECTOMY     TOTAL HIP ARTHROPLASTY Right 01/16/2021   Procedure: TOTAL HIP ARTHROPLASTY ANTERIOR APPROACH;  Surgeon: Ollen Gross, MD;  Location: [facility];  Service: Orthopedics;  Laterality: Right;    uterus ablation     VULVECTOMY N/A 11/08/2019   Procedure: excision of VULVA, vulvoscopy;  Surgeon: Marcelle Overlie, MD;  Location: [facility] [place];  Service: Gynecology;  Laterality: N/A;    Current Outpatient Medications  Medication Sig Dispense Refill   albuterol (PROVENTIL HFA;VENTOLIN HFA) 108 (90 Base) MCG/ACT inhaler Inhale 2 puffs into the lungs every 4 (four) hours as needed for wheezing or shortness of breath. 1 Inhaler 0   ALPRAZolam (XANAX) 1 MG tablet Take 1 mg by mouth 2 (two) times daily as needed for anxiety.  2   aspirin EC 81 MG tablet Take 1 tablet (81 mg total) by mouth daily. Swallow whole. 30 tablet 11   bisoprolol-hydrochlorothiazide (ZIAC) 5-6.25 MG tablet Take 1 tablet by mouth daily. Pt takes in the pm     carbamide peroxide (DEBROX) 6.5 % OTIC solution Place 5 drops into both ears 2 (two) times daily. 15 mL 0   escitalopram (LEXAPRO) 20 MG tablet Take 20 mg by mouth at bedtime. Takes in the pm     fluticasone (FLONASE) 50 MCG/ACT nasal spray Place 2 sprays into both nostrils at bedtime.     furosemide (LASIX) 20 MG tablet Take 1 tablet (20 mg total) by mouth daily as needed. 90 tablet 3   nitroGLYCERIN (NITROSTAT) 0.4 MG SL tablet Place 0.4 mg under the tongue every 5 (five) minutes as needed for  chest pain.     oxyCODONE (OXY IR/ROXICODONE) 5 MG immediate release tablet Take 1 tablet (5 mg total) by mouth every 6 (six) hours as needed for severe pain or moderate pain. 15 tablet 0   pantoprazole (PROTONIX) 40 MG tablet Take 40 mg by mouth daily.     rosuvastatin (CRESTOR) 10 MG tablet Take 10 mg by mouth at bedtime.     triamcinolone cream (KENALOG) 0.1 % Apply 1 application topically 2 (two) times daily. (Patient taking differently: Apply 1 application  topically 2 (two) times daily as needed (irritation).) 454 g 0   No current facility-administered medications for this visit.   Allergies:  Clomiphene citrate, Doxycycline, Morphine and codeine, Other, Sulfa antibiotics, and Clomiphene   Social History: The patient  reports that she has been smoking cigarettes. She started smoking about 44 years ago. She has a 66.5 pack-year smoking history. She uses smokeless tobacco. She reports that she does not currently use alcohol. She reports that she does not use drugs.   Family History: The patient's family history includes Anxiety disorder in her maternal grandmother and paternal grandmother; Cerebral aneurysm in her father; Depression in her mother and paternal grandmother; Heart attack in her father; Heart disease in her father; OCD in her paternal grandmother.   ROS:  Please see the history of present illness. Otherwise, complete review of systems is positive for none  All other systems are reviewed and negative.   Physical Exam: VS:  BP 138/80   Pulse 60   Ht 5' 4.5" (1.638 m)   Wt 251 lb 6.4 oz (114 kg)   SpO2 95%   BMI 42.49 kg/m , BMI Body mass index is 42.49 kg/m.  Wt Readings from Last 3 Encounters:  07/23/23 251 lb 6.4 oz (114 kg)  07/13/23 250 lb (113.4 kg)  06/10/23 254 lb (115.2 kg)    General: Patient appears comfortable at rest. HEENT: Conjunctiva and lids normal, oropharynx clear with moist mucosa. Neck: Supple, no elevated JVP or carotid bruits, no  thyromegaly. Lungs: Clear to auscultation, nonlabored breathing at rest. Cardiac: Regular rate and rhythm, no S3 or significant systolic murmur, no pericardial rub. Abdomen: Soft, nontender, no hepatomegaly, bowel sounds present, no guarding or rebound. Extremities: No pitting edema, distal pulses 2+. Skin: Warm and dry. Musculoskeletal: No kyphosis. Neuropsychiatric: Alert and oriented x3, affect grossly appropriate.  Recent Labwork: 06/10/2023: ALT 18; AST 14; BUN 7; Creatinine 0.88; Hemoglobin 14.4; Platelet Count 151; Potassium 4.0; Sodium 136     Component Value Date/Time   CHOL 194 08/10/2018 0539   TRIG 264 (H) 08/10/2018 0539   HDL 36 (L) 08/10/2018 1308  CHOLHDL 5.4 08/10/2018 0539   VLDL 53 (H) 08/10/2018 0539   LDLCALC 105 (H) 08/10/2018 0539   LDLDIRECT 200.3 09/04/2009 1308     Assessment and Plan:  Preop cardiac risk stratification for robotic assisted left upper lobe lingular segmentectomy CAD s/p LCx PCI in 2014 Chronic diastolic heart failure CAS (known L ICA occlusion) HTN, controlled HLD, unknown values   -EKG from 04/2023 showed NSR, no ischemia. Denies having angina but did have resting chest pains in the past and led to the diagnosis of lung cancer. Has stable SOB. Will obtain NM stress test for risk stratification. Will schedule early next week. -Continue cardioprotective medications with aspirin 81 mg once daily and rosuvastatin 20 mg nightly. -Continue antihypertensive medications, bisoprolol-HCTZ 5-6.25 mg once daily.    Addendum on 10/292/2024 after stress test Stress test showed no evidence of ischemia or infarction. Low risk study. Patient is at a low risk for any peri-operative cardiac complications if she undergoes lung surgery.  I have spent a total duration of 30 minutes reviewing the prior notes, EKG, labs, stress test, cath reports, face-to-face discussion/counseling of her medical condition, pathophysiology, evaluation, management, ordering  test and documenting the findings in the note.     Medication Adjustments/Labs and Tests Ordered: Current medicines are reviewed at length with the patient today.  Concerns regarding medicines are outlined above.    Disposition:  Follow up  6 months  Signed Sabir Charters Verne Spurr, MD, 07/23/2023 2:24 PM    [facility] [address] [place], [place], [place] [address]

## 2023-07-23 NOTE — Patient Instructions (Signed)
Medication Instructions:  Your physician recommends that you continue on your current medications as directed. Please refer to the Current Medication list given to you today.   Labwork: None  Testing/Procedures: Your physician has requested that you have a lexiscan myoview. For further information please visit [email or web address] Please follow instruction sheet, as given.   Follow-Up: Your physician recommends that you schedule a follow-up appointment in: 6 months  Any Other Special Instructions Will Be Listed Below (If Applicable).  Thank you for choosing [facility]!      If you need a refill on your cardiac medications before your next appointment, please call your pharmacy.

## 2023-07-28 ENCOUNTER — Ambulatory Visit (HOSPITAL_COMMUNITY)
Admission: RE | Admit: 2023-07-28 | Discharge: 2023-07-28 | Disposition: A | Discharge status: Home / Self Care | Payer: Medicare HMO | Source: Ambulatory Visit | Attending: Internal Medicine | Admitting: Internal Medicine

## 2023-07-28 ENCOUNTER — Encounter: Payer: Self-pay | Admitting: *Deleted

## 2023-07-28 ENCOUNTER — Encounter: Payer: Self-pay | Admitting: Thoracic Surgery (Cardiothoracic Vascular Surgery)

## 2023-07-28 ENCOUNTER — Other Ambulatory Visit: Payer: Self-pay | Admitting: *Deleted

## 2023-07-28 ENCOUNTER — Ambulatory Visit (HOSPITAL_BASED_OUTPATIENT_CLINIC_OR_DEPARTMENT_OTHER)
Admission: RE | Admit: 2023-07-28 | Discharge: 2023-07-28 | Disposition: A | Discharge status: Home / Self Care | Payer: Medicare HMO | Source: Ambulatory Visit | Attending: Internal Medicine | Admitting: Internal Medicine

## 2023-07-28 DIAGNOSIS — R918 Other nonspecific abnormal finding of lung field: Secondary | ICD-10-CM

## 2023-07-28 DIAGNOSIS — Z01810 Encounter for preprocedural cardiovascular examination: Secondary | ICD-10-CM | POA: Diagnosis not present

## 2023-07-28 LAB — NM MYOCAR MULTI W/SPECT W/WALL MOTION / EF
Base ST Depression (mm): 0 mm
LV dias vol: 66 mL (ref 46–106)
LV sys vol: 17 mL
Nuc Stress EF: 75 %
Peak HR: 80 {beats}/min
RATE: 0.3
Rest HR: 56 {beats}/min
Rest Nuclear Isotope Dose: 8.7 mCi
SDS: 3
SRS: 1
SSS: 4
ST Depression (mm): 0 mm
Stress Nuclear Isotope Dose: 27 mCi
TID: 0.86

## 2023-07-28 MED ORDER — TECHNETIUM TC 99M TETROFOSMIN IV KIT
30.0000 | PACK | Freq: Once | INTRAVENOUS | Status: AC | PRN
  Administered 2023-07-28: 27 via INTRAVENOUS

## 2023-07-28 MED ORDER — TECHNETIUM TC 99M TETROFOSMIN IV KIT
10.0000 | PACK | Freq: Once | INTRAVENOUS | Status: AC | PRN
  Administered 2023-07-28: 8.7 via INTRAVENOUS

## 2023-07-28 MED ORDER — SODIUM CHLORIDE FLUSH 0.9 % IV SOLN
INTRAVENOUS | Status: AC
  Administered 2023-07-28: 10 mL via INTRAVENOUS
  Filled 2023-07-28: qty 10

## 2023-07-28 MED ORDER — REGADENOSON 0.4 MG/5ML IV SOLN
INTRAVENOUS | Status: AC
  Administered 2023-07-28: 0.4 mg via INTRAVENOUS
  Filled 2023-07-28: qty 5

## 2023-08-05 NOTE — Pre-Procedure Instructions (Signed)
Surgical Instructions   Your procedure is scheduled on Monday, November 11th. Report to [facility] Main Entrance "A" at 08:45 A.M., then check in with the Admitting office. Any questions or running late day of surgery: call [phone]  Questions prior to your surgery date: call [phone], Monday-Friday, 8am-4pm. If you experience any cold or flu symptoms such as cough, fever, chills, shortness of breath, etc. between now and your scheduled surgery, please notify us at the above number.     Remember:  Do not eat or drink after midnight the night before your surgery     Take these medicines the morning of surgery with A SIP OF WATER  buPROPion (WELLBUTRIN XL)    May take these medicines IF NEEDED: acetaminophen (TYLENOL)  albuterol (PROVENTIL HFA;VENTOLIN HFA)- bring inhaler with you on day of surgery ALPRAZolam (XANAX)  nitroGLYCERIN (NITROSTAT) if you have to take this medication prior to surgery, please call one of the above numbers and report this to a nurse Oxycodone HCl    One week prior to surgery, STOP taking any Aleve, Naproxen, Ibuprofen, Motrin, Advil, Goody's, BC's, all herbal medications, fish oil, and non-prescription vitamins.                     Do NOT Smoke (Tobacco/Vaping) for 24 hours prior to your procedure.  If you use a CPAP at night, you may bring your mask/headgear for your overnight stay.   You will be asked to remove any contacts, glasses, piercing's, hearing aid's, dentures/partials prior to surgery. Please bring cases for these items if needed.    Patients discharged the day of surgery will not be allowed to drive home, and someone needs to stay with them for 24 hours.  SURGICAL WAITING ROOM VISITATION Patients may have no more than 2 support people in the waiting area - these visitors may rotate.   Pre-op nurse will coordinate an appropriate time for 1 ADULT support person, who may not rotate, to accompany patient in pre-op.  Children under  the age of 69 must have an adult with them who is not the patient and must remain in the main waiting area with an adult.  If the patient needs to stay at the hospital during part of their recovery, the visitor guidelines for inpatient rooms apply.  Please refer to the [facility] website for the visitor guidelines for any additional information.   If you received a COVID test during your pre-op visit  it is requested that you wear a mask when out in public, stay away from anyone that may not be feeling well and notify your surgeon if you develop symptoms. If you have been in contact with anyone that has tested positive in the last 10 days please notify you surgeon.      Pre-operative CHG Bathing Instructions   You can play a key role in reducing the risk of infection after surgery. Your skin needs to be as free of germs as possible. You can reduce the number of germs on your skin by washing with CHG (chlorhexidine gluconate) soap before surgery. CHG is an antiseptic soap that kills germs and continues to kill germs even after washing.   DO NOT use if you have an allergy to chlorhexidine/CHG or antibacterial soaps. If your skin becomes reddened or irritated, stop using the CHG and notify one of our RNs at [phone].              TAKE A SHOWER THE NIGHT BEFORE  SURGERY AND THE DAY OF SURGERY    Please keep in mind the following:  DO NOT shave, including legs and underarms, 48 hours prior to surgery.   You may shave your face before/day of surgery.  Place clean sheets on your bed the night before surgery Use a clean washcloth (not used since being washed) for each shower. DO NOT sleep with pet's night before surgery.  CHG Shower Instructions:  Wash your face and private area with normal soap. If you choose to wash your hair, wash first with your normal shampoo.  After you use shampoo/soap, rinse your hair and body thoroughly to remove shampoo/soap residue.  Turn the water OFF and apply  half the bottle of CHG soap to a CLEAN washcloth.  Apply CHG soap ONLY FROM YOUR NECK DOWN TO YOUR TOES (washing for 3-5 minutes)  DO NOT use CHG soap on face, private areas, open wounds, or sores.  Pay special attention to the area where your surgery is being performed.  If you are having back surgery, having someone wash your back for you may be helpful. Wait 2 minutes after CHG soap is applied, then you may rinse off the CHG soap.  Pat dry with a clean towel  Put on clean pajamas    Additional instructions for the day of surgery: DO NOT APPLY any lotions, deodorants, cologne, or perfumes.   Do not wear jewelry or makeup Do not wear nail polish, gel polish, artificial nails, or any other type of covering on natural nails (fingers and toes) Do not bring valuables to the hospital. [facility] is not responsible for valuables/personal belongings. Put on clean/comfortable clothes.  Please brush your teeth.  Ask your nurse before applying any prescription medications to the skin.

## 2023-08-06 ENCOUNTER — Encounter (HOSPITAL_COMMUNITY)
Admission: RE | Admit: 2023-08-06 | Discharge: 2023-08-06 | Disposition: A | Discharge status: Home / Self Care | Payer: Medicare HMO | Source: Ambulatory Visit | Attending: Thoracic Surgery (Cardiothoracic Vascular Surgery) | Admitting: Thoracic Surgery (Cardiothoracic Vascular Surgery)

## 2023-08-06 ENCOUNTER — Other Ambulatory Visit: Payer: Self-pay

## 2023-08-06 ENCOUNTER — Ambulatory Visit (HOSPITAL_COMMUNITY)
Admission: RE | Admit: 2023-08-06 | Discharge: 2023-08-06 | Disposition: A | Discharge status: Home / Self Care | Payer: Medicare HMO | Source: Ambulatory Visit | Attending: Thoracic Surgery (Cardiothoracic Vascular Surgery) | Admitting: Thoracic Surgery (Cardiothoracic Vascular Surgery)

## 2023-08-06 ENCOUNTER — Encounter (HOSPITAL_COMMUNITY): Payer: Self-pay

## 2023-08-06 VITALS — BP 134/75 | HR 66 | Temp 99.1°F | Resp 17 | Ht 65.0 in | Wt 248.5 lb

## 2023-08-06 DIAGNOSIS — E785 Hyperlipidemia, unspecified: Secondary | ICD-10-CM | POA: Insufficient documentation

## 2023-08-06 DIAGNOSIS — E282 Polycystic ovarian syndrome: Secondary | ICD-10-CM | POA: Diagnosis not present

## 2023-08-06 DIAGNOSIS — R918 Other nonspecific abnormal finding of lung field: Secondary | ICD-10-CM | POA: Insufficient documentation

## 2023-08-06 DIAGNOSIS — K219 Gastro-esophageal reflux disease without esophagitis: Secondary | ICD-10-CM | POA: Diagnosis not present

## 2023-08-06 DIAGNOSIS — R0600 Dyspnea, unspecified: Secondary | ICD-10-CM | POA: Diagnosis not present

## 2023-08-06 DIAGNOSIS — I2510 Atherosclerotic heart disease of native coronary artery without angina pectoris: Secondary | ICD-10-CM | POA: Diagnosis not present

## 2023-08-06 DIAGNOSIS — Z1152 Encounter for screening for COVID-19: Secondary | ICD-10-CM | POA: Diagnosis not present

## 2023-08-06 DIAGNOSIS — G4733 Obstructive sleep apnea (adult) (pediatric): Secondary | ICD-10-CM | POA: Insufficient documentation

## 2023-08-06 DIAGNOSIS — I6529 Occlusion and stenosis of unspecified carotid artery: Secondary | ICD-10-CM | POA: Diagnosis not present

## 2023-08-06 DIAGNOSIS — Z01818 Encounter for other preprocedural examination: Secondary | ICD-10-CM | POA: Insufficient documentation

## 2023-08-06 DIAGNOSIS — I10 Essential (primary) hypertension: Secondary | ICD-10-CM | POA: Insufficient documentation

## 2023-08-06 DIAGNOSIS — F17200 Nicotine dependence, unspecified, uncomplicated: Secondary | ICD-10-CM | POA: Insufficient documentation

## 2023-08-06 DIAGNOSIS — M797 Fibromyalgia: Secondary | ICD-10-CM | POA: Diagnosis not present

## 2023-08-06 HISTORY — DX: Bronchitis, not specified as acute or chronic: J40

## 2023-08-06 HISTORY — DX: Personal history of other diseases of the digestive system: Z87.19

## 2023-08-06 LAB — CBC
HCT: 46.2 % — ABNORMAL HIGH (ref 36.0–46.0)
Hemoglobin: 15 g/dL (ref 12.0–15.0)
MCH: 28.5 pg (ref 26.0–34.0)
MCHC: 32.5 g/dL (ref 30.0–36.0)
MCV: 87.7 fL (ref 80.0–100.0)
Platelets: 165 10*3/uL (ref 150–400)
RBC: 5.27 MIL/uL — ABNORMAL HIGH (ref 3.87–5.11)
RDW: 15 % (ref 11.5–15.5)
WBC: 8.3 10*3/uL (ref 4.0–10.5)
nRBC: 0 % (ref 0.0–0.2)

## 2023-08-06 LAB — SARS CORONAVIRUS 2 (TAT 6-24 HRS): SARS Coronavirus 2: NEGATIVE

## 2023-08-06 LAB — COMPREHENSIVE METABOLIC PANEL
ALT: 21 U/L (ref 0–44)
AST: 18 U/L (ref 15–41)
Albumin: 3.7 g/dL (ref 3.5–5.0)
Alkaline Phosphatase: 71 U/L (ref 38–126)
Anion gap: 9 (ref 5–15)
BUN: 6 mg/dL (ref 6–20)
CO2: 22 mmol/L (ref 22–32)
Calcium: 9.1 mg/dL (ref 8.9–10.3)
Chloride: 101 mmol/L (ref 98–111)
Creatinine, Ser: 1.13 mg/dL — ABNORMAL HIGH (ref 0.44–1.00)
GFR, Estimated: 56 mL/min — ABNORMAL LOW (ref >60)
Glucose, Bld: 99 mg/dL (ref 70–99)
Potassium: 4 mmol/L (ref 3.5–5.1)
Sodium: 132 mmol/L — ABNORMAL LOW (ref 135–145)
Total Bilirubin: 0.6 mg/dL (ref <1.2)
Total Protein: 7.5 g/dL (ref 6.5–8.1)

## 2023-08-06 LAB — TYPE AND SCREEN
ABO/RH(D): A POS
Antibody Screen: NEGATIVE

## 2023-08-06 LAB — SURGICAL PCR SCREEN
MRSA, PCR: NEGATIVE
Staphylococcus aureus: NEGATIVE

## 2023-08-06 LAB — URINALYSIS, ROUTINE W REFLEX MICROSCOPIC
Bilirubin Urine: NEGATIVE
Glucose, UA: NEGATIVE mg/dL
Hgb urine dipstick: NEGATIVE
Ketones, ur: NEGATIVE mg/dL
Leukocytes,Ua: NEGATIVE
Nitrite: NEGATIVE
Protein, ur: NEGATIVE mg/dL
Specific Gravity, Urine: 1.004 — ABNORMAL LOW (ref 1.005–1.030)
pH: 8 (ref 5.0–8.0)

## 2023-08-06 LAB — PROTIME-INR
INR: 1 (ref 0.8–1.2)
Prothrombin Time: 13.6 s (ref 11.4–15.2)

## 2023-08-06 LAB — APTT: aPTT: 31 s (ref 24–36)

## 2023-08-06 NOTE — Progress Notes (Signed)
PCP - Tera Helper, PA-C Cardiologist - Dr. Luane School (Pt says she normally sees [place], [place])  PPM/ICD - denies   Chest x-ray - 08/06/23 EKG - 08/06/23 Stress Test - 07/28/23 ECHO - 08/18/21 Cardiac Cath - 08/09/18  Sleep Study - OSA+ CPAP - denies, pt states this is mild and no CPAP was recommended  DM- denies  Blood Thinner Instructions: n/a Aspirin Instructions: hold DOS  ERAS Protcol - no, NPO   COVID TEST- 08/06/23   Anesthesia review: yes, cardiac hx  Patient denies shortness of breath, fever, cough and chest pain at PAT appointment   All instructions explained to the patient, with a verbal understanding of the material. Patient agrees to go over the instructions while at home for a better understanding. Patient also instructed to wear a mask in public after being tested for COVID-19. The opportunity to ask questions was provided.

## 2023-08-06 NOTE — Progress Notes (Signed)
Pt will need ABG on DOS. Unable to obtain in PAT.

## 2023-08-07 NOTE — Anesthesia Preprocedure Evaluation (Signed)
Anesthesia Evaluation  Patient identified by MRN, date of birth, ID band Patient awake    Reviewed: Allergy & Precautions, H&P , NPO status , Patient's Chart, lab work & pertinent test results  Airway Mallampati: II   Neck ROM: full    Dental   Pulmonary shortness of breath, asthma , sleep apnea , Current Smoker   breath sounds clear to auscultation       Cardiovascular hypertension, + CAD, + Past MI and + Cardiac Stents   Rhythm:regular Rate:Normal     Neuro/Psych  PSYCHIATRIC DISORDERS Anxiety Depression     Neuromuscular disease    GI/Hepatic hiatal hernia,GERD  ,,  Endo/Other    Renal/GU      Musculoskeletal  (+) Arthritis ,  Fibromyalgia -  Abdominal   Peds  Hematology   Anesthesia Other Findings   Reproductive/Obstetrics                             Anesthesia Physical Anesthesia Plan  ASA: 3  Anesthesia Plan: General   Post-op Pain Management:    Induction: Intravenous  PONV Risk Score and Plan: 2 and Ondansetron, Dexamethasone, Midazolam and Treatment may vary due to age or medical condition  Airway Management Planned: Oral ETT and Double Lumen EBT  Additional Equipment: Arterial line  Intra-op Plan:   Post-operative Plan: Extubation in OR  Informed Consent: I have reviewed the patients History and Physical, chart, labs and discussed the procedure including the risks, benefits and alternatives for the proposed anesthesia with the patient or authorized representative who has indicated his/her understanding and acceptance.     Dental advisory given  Plan Discussed with: CRNA, Anesthesiologist and Surgeon  Anesthesia Plan Comments: (PAT note written 08/07/2023 by Shonna Chock, PA-C.  )       Anesthesia Quick Evaluation

## 2023-08-07 NOTE — Progress Notes (Signed)
Anesthesia Chart Review:  Case: [id number] Date/Time: 08/10/23 1032   Procedure: XI ROBOTIC ASSISTED THORACOSCOPY- LEFT UPPER LINGULAR SEGMENTECTOMY (Left: Chest)   Anesthesia type: General   Pre-op diagnosis: Lingular nodule   [facility] / [facility]   Surgeons: Loreli Slot, MD       DISCUSSION: Patient is a 57 year old female scheduled for the above procedure.  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  Known carotid disease followed by Dr. Lenell Antu. Last visit noted was in February 2023. She has known LICA occlusion. 11/05/21 [place] suggested 80-99% RICA stenosis. CTA ordered and showed on mild RICA stenosis. As of 11/22/21, Dr. Lenell Antu wrote, "CT angiogram reviewed in detail. Right carotid with very mild stenosis. No role for revascularization in this patient. Continue best medical therapy for atherosclerosis. Follow up with me in 1 year with repeat duplex."  Last evaluation by cardiologist Dr. Georgeann Oppenheim was on 07/23/23. 07/2018 LHC showed patent LCX stent with mild to moderate nonobstructive disease involving the mid-LAD and RCA. Preoperative stress test ordered. He added addendum on 07/28/23: [07/28/2023] Stress test showed no evidence of ischemia or infarction. Low risk study. Patient is at a low risk for any peri-operative cardiac complications if she undergoes lung surgery.  Anesthesia team to evaluate on the day of surgery. For ABG on arrival.   VS: BP 134/75   Pulse 66   Temp 37.3 C   Resp 17   Ht 5\' 5"  (1.651 m)   Wt 112.7 kg   LMP 07/01/2015   SpO2 98%   BMI 41.35 kg/m   PROVIDERS: [name], PA-C is PCP  Luane School, MD is cardiologist Heath Lark, MD is vascular surgeon   LABS: Labs reviewed:  Acceptable for surgery. (all labs ordered are listed, but only abnormal results are displayed)  Labs Reviewed  CBC - Abnormal; Notable for the following components:      Result Value   RBC 5.27 (*)    HCT 46.2 (*)    All other components within normal limits  COMPREHENSIVE METABOLIC PANEL - Abnormal; Notable for the following components:   Sodium 132 (*)    Creatinine, Ser 1.13 (*)    GFR, Estimated 56 (*)    All other components within normal limits  URINALYSIS, ROUTINE W REFLEX MICROSCOPIC - Abnormal; Notable for the following components:   Color, Urine STRAW (*)    Specific Gravity, Urine 1.004 (*)    All other components within normal limits  SURGICAL PCR SCREEN  SARS CORONAVIRUS 2 (TAT 6-24 HRS)  PROTIME-INR  APTT  TYPE AND SCREEN     IMAGES: CXR 08/06/23: IMPRESSION: No active cardiopulmonary disease.   PET Scan 05/26/23: IMPRESSION: 2.5 cm lingular nodule, compatible with primary bronchogenic carcinoma. No evidence of metastatic disease.   CTA Chest 05/06/23: IMPRESSION: - No evidence of pulmonary embolism. - 2.2 cm lingular nodule, progressive from 2022, suspicious for primary bronchogenic carcinoma. PET-CT is suggested for further evaluation. 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 No follow-up is recommended. - Aortic Atherosclerosis (ICD10-I70.0) and Emphysema (ICD10-J43.9).    EKG: 08/06/23: NSR   CV: Nuclear stress test 07/28/23:   Stress ECG is negative for ischemia and arrhythmias.   LV perfusion is normal. There is no evidence of ischemia. There is no evidence of infarction.  Left ventricular function is normal. Nuclear stress EF: 75%.   Findings are consistent with no ischemia and no infarction. The study is low risk.    US Carotid 11/05/2021: Summary:  - Right Carotid: Velocities in the right ICA are consistent with a 80-99% stenosis.  - Left Carotid: Evidence consistent with a total occlusion of the left ICA.  - Vertebrals:  Bilateral  vertebral arteries demonstrate antegrade flow.  - Subclavians: Normal flow hemodynamics were seen in bilateral subclavian arteries.  - Followed by CTA Neck 11/21/21: IMPRESSION: - Continued normal appearance of the brain itself. - Atherosclerosis of the aorta, mild. - Atherosclerotic disease at the right carotid bifurcation and ICA bulb, with maximal stenosis of only 35%. - Advanced atherosclerotic disease of the left ICA bulb with complete occlusion presently. Reconstitution of the cervical internal carotid artery via the ascending pharyngeal artery. The vessel is patent as a small vessel through the upper cervical region, skull base and siphon region. - No intracranial large vessel occlusion or correctable proximal stenosis. -  Vertebral artery origins are not well seen because of shoulder density.     Echo 08/18/2021: IMPRESSIONS   1. Left ventricular ejection fraction, by estimation, is 60 to 65%. The  left ventricle has normal function. The left ventricle has no regional  wall motion abnormalities. Left ventricular diastolic parameters were  normal.   2. Right ventricular systolic function is normal. The right ventricular  size is normal.   3. The mitral valve is normal in structure. No evidence of mitral valve  regurgitation. No evidence of mitral stenosis.   4. The aortic valve was not well visualized. Aortic valve regurgitation  is not visualized. No aortic stenosis is present.   5. The inferior vena cava is normal in size with greater than 50%  respiratory variability, suggesting right atrial pressure of 3 mmHg.     Cardiac Catheterization: 08/09/2018: Conclusions: Mild to moderate, non-obstructive coronary artery disease involving the mid LAD and RCA. Widely patent proximal/mid LCx stent. Normal left ventricular contraction with mildly elevated filling pressure.   Recommendations: Medical therapy and aggressive secondary prevention, including smoking cessation, weight  loss, and lipid control.  Consider increasing statin therapy if LDL not at goal (<70). Start furosemide 20 mg daily for component of diastolic heart failure.   Recommend Aspirin 81mg  daily for moderate CAD.    Past Medical History:  Diagnosis Date   Anxiety    Arthritis    oa needs hip replacement on right   Asthma    allergies    Bronchitis    Cancer (HCC)    squamous cell areas removed from vulva and pre caner areas removed from leg    Carotid artery occlusion    left   Complication of anesthesia    major depression after general anesthesia   Coronary artery disease    LHC 11/25/12 90% stenosis mid LCx & otherwise nonobstructive dz w/ EF 60-65% S/p PTCA/DES to LCx   Depression    Dyspnea    with exertion    Elevated cholesterol    Exertional dyspnea    chronic   Fibromyalgia    GERD (gastroesophageal reflux disease)    History of cardiovascular stress test 06/2015   low risk   History of hiatal hernia    HPV in female    Hyperlipidemia    Hypertension    Liver abscess 09/09/2021   MI (myocardial infarction) (HCC) fe 17, 2014   Obesity    Pneumonia  hx of x 2 years ago    Polycystic ovary disease    Skin tear of upper arm without complication, left, sequela    small skin tear left upper arm no drainage for 1 week   Sleep apnea    mild no cpap    Tobacco abuse     Past Surgical History:  Procedure Laterality Date   CORONARY ANGIOPLASTY WITH STENT PLACEMENT     LEFT HEART CATH AND CORONARY ANGIOGRAPHY N/A 08/09/2018   Procedure: LEFT HEART CATH AND CORONARY ANGIOGRAPHY;  Surgeon: Yvonne Kendall, MD;  [facility]: [facility] INVAS[facility] CV LAB;  Service: Cardiovascular;  Laterality: N/A;   LEFT HEART CATHETERIZATION WITH CORONARY ANGIOGRAM N/A 11/15/2012   Procedure: LEFT HEART CATHETERIZATION WITH CORONARY ANGIOGRAM;  Surgeon: Vesta Mixer, MD;  Location: [facility] CATH LAB;  Service: Cardiovascular;  Laterality: N/A;   PERCUTANEOUS CORONARY STENT INTERVENTION (PCI-S)   11/15/2012   Procedure: PERCUTANEOUS CORONARY STENT INTERVENTION (PCI-S);  Surgeon: Vesta Mixer, MD;  Location: [facility] CATH LAB;  Service: Cardiovascular;;   skin cancer removed right lower leg Right    also removed from left leg and chest area   TONSILLECTOMY     TOTAL HIP ARTHROPLASTY Right 01/16/2021   Procedure: TOTAL HIP ARTHROPLASTY ANTERIOR APPROACH;  Surgeon: Ollen Gross, MD;  Location: [facility] ORS;  Service: Orthopedics;  Laterality: Right;    uterus ablation     VULVECTOMY N/A 11/08/2019   Procedure: excision of VULVA, vulvoscopy;  Surgeon: Marcelle Overlie, MD;  Location: [facility] [place] [facility];  Service: Gynecology;  Laterality: N/A;    MEDICATIONS:  acetaminophen (TYLENOL) 500 MG tablet   albuterol (PROVENTIL HFA;VENTOLIN HFA) 108 (90 Base) MCG/ACT inhaler   ALPRAZolam (XANAX) 1 MG tablet   aspirin EC 81 MG tablet   bismuth subsalicylate (PEPTO BISMOL) 262 MG chewable tablet   bisoprolol-hydrochlorothiazide (ZIAC) 5-6.25 MG tablet   buPROPion (WELLBUTRIN XL) 150 MG 24 hr tablet   dextromethorphan 15 MG/5ML syrup   escitalopram (LEXAPRO) 20 MG tablet   fluticasone (FLONASE) 50 MCG/ACT nasal spray   furosemide (LASIX) 20 MG tablet   nitroGLYCERIN (NITROSTAT) 0.4 MG SL tablet   Oxycodone HCl 10 MG TABS   pantoprazole (PROTONIX) 40 MG tablet   rosuvastatin (CRESTOR) 10 MG tablet   triamcinolone cream (KENALOG) 0.5 %   No current facility-administered medications for this encounter.    Shonna Chock, PA-C Surgical Short Stay/Anesthesiology [facility] Phone [phone] [facility] Phone [phone] 08/07/2023 9:39 AM

## 2023-08-10 ENCOUNTER — Inpatient Hospital Stay (HOSPITAL_COMMUNITY): Payer: Medicare HMO | Admitting: Anesthesiology

## 2023-08-10 ENCOUNTER — Other Ambulatory Visit: Payer: Self-pay

## 2023-08-10 ENCOUNTER — Encounter (HOSPITAL_COMMUNITY)
Admission: RE | Disposition: A | Discharge status: Home / Self Care | Payer: Self-pay | Source: Home / Self Care | Attending: Thoracic Surgery (Cardiothoracic Vascular Surgery)

## 2023-08-10 ENCOUNTER — Inpatient Hospital Stay (HOSPITAL_COMMUNITY): Payer: Medicare HMO | Admitting: Vascular Surgery

## 2023-08-10 ENCOUNTER — Inpatient Hospital Stay (HOSPITAL_COMMUNITY)
Admission: RE | Admit: 2023-08-10 | Discharge: 2023-08-13 | DRG: 164 | Disposition: A | Discharge status: Home / Self Care | Payer: Medicare HMO | Attending: Thoracic Surgery (Cardiothoracic Vascular Surgery) | Admitting: Thoracic Surgery (Cardiothoracic Vascular Surgery)

## 2023-08-10 ENCOUNTER — Inpatient Hospital Stay (HOSPITAL_COMMUNITY): Payer: Medicare HMO

## 2023-08-10 ENCOUNTER — Encounter (HOSPITAL_COMMUNITY): Payer: Self-pay | Admitting: Thoracic Surgery (Cardiothoracic Vascular Surgery)

## 2023-08-10 DIAGNOSIS — Z79899 Other long term (current) drug therapy: Secondary | ICD-10-CM | POA: Diagnosis not present

## 2023-08-10 DIAGNOSIS — R911 Solitary pulmonary nodule: Secondary | ICD-10-CM | POA: Diagnosis not present

## 2023-08-10 DIAGNOSIS — Z6839 Body mass index (BMI) 39.0-39.9, adult: Secondary | ICD-10-CM

## 2023-08-10 DIAGNOSIS — I2510 Atherosclerotic heart disease of native coronary artery without angina pectoris: Secondary | ICD-10-CM

## 2023-08-10 DIAGNOSIS — M25512 Pain in left shoulder: Secondary | ICD-10-CM | POA: Diagnosis not present

## 2023-08-10 DIAGNOSIS — E66812 Obesity, class 2: Secondary | ICD-10-CM | POA: Diagnosis present

## 2023-08-10 DIAGNOSIS — Z955 Presence of coronary angioplasty implant and graft: Secondary | ICD-10-CM | POA: Diagnosis not present

## 2023-08-10 DIAGNOSIS — Z9889 Other specified postprocedural states: Secondary | ICD-10-CM | POA: Diagnosis not present

## 2023-08-10 DIAGNOSIS — F17210 Nicotine dependence, cigarettes, uncomplicated: Secondary | ICD-10-CM | POA: Diagnosis present

## 2023-08-10 DIAGNOSIS — Z86718 Personal history of other venous thrombosis and embolism: Secondary | ICD-10-CM | POA: Diagnosis not present

## 2023-08-10 DIAGNOSIS — Z85828 Personal history of other malignant neoplasm of skin: Secondary | ICD-10-CM | POA: Diagnosis not present

## 2023-08-10 DIAGNOSIS — F40240 Claustrophobia: Secondary | ICD-10-CM | POA: Diagnosis not present

## 2023-08-10 DIAGNOSIS — G4730 Sleep apnea, unspecified: Secondary | ICD-10-CM | POA: Diagnosis present

## 2023-08-10 DIAGNOSIS — Z818 Family history of other mental and behavioral disorders: Secondary | ICD-10-CM | POA: Diagnosis not present

## 2023-08-10 DIAGNOSIS — Z8249 Family history of ischemic heart disease and other diseases of the circulatory system: Secondary | ICD-10-CM

## 2023-08-10 DIAGNOSIS — I110 Hypertensive heart disease with heart failure: Secondary | ICD-10-CM | POA: Diagnosis present

## 2023-08-10 DIAGNOSIS — Z7982 Long term (current) use of aspirin: Secondary | ICD-10-CM

## 2023-08-10 DIAGNOSIS — M549 Dorsalgia, unspecified: Secondary | ICD-10-CM | POA: Diagnosis present

## 2023-08-10 DIAGNOSIS — F419 Anxiety disorder, unspecified: Secondary | ICD-10-CM | POA: Diagnosis present

## 2023-08-10 DIAGNOSIS — J9811 Atelectasis: Secondary | ICD-10-CM | POA: Diagnosis not present

## 2023-08-10 DIAGNOSIS — J45909 Unspecified asthma, uncomplicated: Secondary | ICD-10-CM | POA: Diagnosis not present

## 2023-08-10 DIAGNOSIS — J939 Pneumothorax, unspecified: Secondary | ICD-10-CM | POA: Diagnosis not present

## 2023-08-10 DIAGNOSIS — E785 Hyperlipidemia, unspecified: Secondary | ICD-10-CM | POA: Diagnosis not present

## 2023-08-10 DIAGNOSIS — K219 Gastro-esophageal reflux disease without esophagitis: Secondary | ICD-10-CM | POA: Diagnosis present

## 2023-08-10 DIAGNOSIS — D696 Thrombocytopenia, unspecified: Secondary | ICD-10-CM | POA: Diagnosis not present

## 2023-08-10 DIAGNOSIS — I10 Essential (primary) hypertension: Secondary | ICD-10-CM | POA: Diagnosis not present

## 2023-08-10 DIAGNOSIS — I5032 Chronic diastolic (congestive) heart failure: Secondary | ICD-10-CM | POA: Diagnosis not present

## 2023-08-10 DIAGNOSIS — R918 Other nonspecific abnormal finding of lung field: Secondary | ICD-10-CM | POA: Diagnosis not present

## 2023-08-10 DIAGNOSIS — Z96641 Presence of right artificial hip joint: Secondary | ICD-10-CM | POA: Diagnosis present

## 2023-08-10 DIAGNOSIS — M1611 Unilateral primary osteoarthritis, right hip: Secondary | ICD-10-CM | POA: Diagnosis present

## 2023-08-10 DIAGNOSIS — C3412 Malignant neoplasm of upper lobe, left bronchus or lung: Principal | ICD-10-CM | POA: Diagnosis present

## 2023-08-10 DIAGNOSIS — C3492 Malignant neoplasm of unspecified part of left bronchus or lung: Principal | ICD-10-CM | POA: Insufficient documentation

## 2023-08-10 DIAGNOSIS — J90 Pleural effusion, not elsewhere classified: Secondary | ICD-10-CM | POA: Diagnosis not present

## 2023-08-10 DIAGNOSIS — Z4682 Encounter for fitting and adjustment of non-vascular catheter: Secondary | ICD-10-CM | POA: Diagnosis not present

## 2023-08-10 HISTORY — PX: LYMPH NODE DISSECTION: SHX5087

## 2023-08-10 HISTORY — PX: XI ROBOTIC ASSISTED THORACOSCOPY- SEGMENTECTOMY: SHX6881

## 2023-08-10 HISTORY — PX: INTERCOSTAL NERVE BLOCK: SHX5021

## 2023-08-10 SURGERY — RESECTION, LUNG, SEGMENTAL, ROBOT-ASSISTED
Anesthesia: General | Site: Chest | Laterality: Left

## 2023-08-10 MED ORDER — ALBUMIN HUMAN 5 % IV SOLN: INTRAVENOUS | Status: DC | PRN

## 2023-08-10 MED ORDER — MIDAZOLAM HCL 2 MG/2ML IJ SOLN
INTRAMUSCULAR | Status: AC
  Filled 2023-08-10: qty 2

## 2023-08-10 MED ORDER — BUPIVACAINE LIPOSOME 1.3 % IJ SUSP
INTRAMUSCULAR | Status: AC
  Filled 2023-08-10: qty 20

## 2023-08-10 MED ORDER — ACETAMINOPHEN 500 MG PO TABS
1000.0000 mg | ORAL_TABLET | Freq: Four times a day (QID) | ORAL | Status: DC
  Administered 2023-08-10 – 2023-08-13 (×7): 1000 mg via ORAL
  Filled 2023-08-10 (×7): qty 2

## 2023-08-10 MED ORDER — PROPOFOL 10 MG/ML IV BOLUS
INTRAVENOUS | Status: AC
  Filled 2023-08-10: qty 20

## 2023-08-10 MED ORDER — BISACODYL 5 MG PO TBEC
10.0000 mg | DELAYED_RELEASE_TABLET | Freq: Every day | ORAL | Status: DC
Start: 2023-08-10 — End: 2023-08-13
  Administered 2023-08-10 – 2023-08-12 (×3): 10 mg via ORAL
  Filled 2023-08-10 (×2): qty 2

## 2023-08-10 MED ORDER — PANTOPRAZOLE SODIUM 40 MG PO TBEC: 40.0000 mg | DELAYED_RELEASE_TABLET | Freq: Every day | ORAL | Status: DC

## 2023-08-10 MED ORDER — ONDANSETRON HCL 4 MG/2ML IJ SOLN: 4.0000 mg | Freq: Four times a day (QID) | INTRAMUSCULAR | Status: DC | PRN

## 2023-08-10 MED ORDER — CHLORHEXIDINE GLUCONATE 0.12 % MT SOLN
OROMUCOSAL | Status: AC
  Administered 2023-08-10: 15 mL via OROMUCOSAL
  Filled 2023-08-10: qty 15

## 2023-08-10 MED ORDER — OXYCODONE HCL 5 MG PO TABS
5.0000 mg | ORAL_TABLET | ORAL | Status: DC | PRN
  Administered 2023-08-11 – 2023-08-12 (×5): 10 mg via ORAL
  Filled 2023-08-10 (×7): qty 2

## 2023-08-10 MED ORDER — FENTANYL CITRATE PF 50 MCG/ML IJ SOSY
PREFILLED_SYRINGE | INTRAMUSCULAR | Status: AC
  Administered 2023-08-10: 50 ug
  Filled 2023-08-10: qty 1

## 2023-08-10 MED ORDER — FENTANYL CITRATE (PF) 100 MCG/2ML IJ SOLN
INTRAMUSCULAR | Status: AC
  Filled 2023-08-10: qty 2

## 2023-08-10 MED ORDER — PHENYLEPHRINE HCL (PRESSORS) 10 MG/ML IV SOLN
INTRAVENOUS | Status: DC | PRN
  Administered 2023-08-10 (×3): 80 ug via INTRAVENOUS

## 2023-08-10 MED ORDER — ONDANSETRON HCL 4 MG/2ML IJ SOLN
4.0000 mg | Freq: Four times a day (QID) | INTRAMUSCULAR | Status: DC | PRN
Start: 2023-08-10 — End: 2023-08-13

## 2023-08-10 MED ORDER — ROSUVASTATIN CALCIUM 5 MG PO TABS
10.0000 mg | ORAL_TABLET | Freq: Every day | ORAL | Status: DC
  Administered 2023-08-11 – 2023-08-12 (×2): 10 mg via ORAL
  Filled 2023-08-10 (×2): qty 2

## 2023-08-10 MED ORDER — HYDROMORPHONE HCL 1 MG/ML IJ SOLN
INTRAMUSCULAR | Status: AC
  Filled 2023-08-10: qty 0.5

## 2023-08-10 MED ORDER — SENNOSIDES-DOCUSATE SODIUM 8.6-50 MG PO TABS
1.0000 | ORAL_TABLET | Freq: Every day | ORAL | Status: DC
  Administered 2023-08-10 – 2023-08-12 (×3): 1 via ORAL
  Filled 2023-08-10 (×3): qty 1

## 2023-08-10 MED ORDER — FENTANYL CITRATE (PF) 100 MCG/2ML IJ SOLN
INTRAMUSCULAR | Status: AC
  Administered 2023-08-10: 50 ug via INTRAVENOUS
  Filled 2023-08-10: qty 2

## 2023-08-10 MED ORDER — HYDROMORPHONE HCL 1 MG/ML IJ SOLN
INTRAMUSCULAR | Status: AC
  Filled 2023-08-10: qty 1

## 2023-08-10 MED ORDER — FLUTICASONE PROPIONATE 50 MCG/ACT NA SUSP
2.0000 | Freq: Every day | NASAL | Status: DC
Start: 2023-08-11 — End: 2023-08-13
  Administered 2023-08-12: 2 via NASAL
  Filled 2023-08-10: qty 16

## 2023-08-10 MED ORDER — FENTANYL CITRATE (PF) 250 MCG/5ML IJ SOLN
INTRAMUSCULAR | Status: AC
  Filled 2023-08-10: qty 5

## 2023-08-10 MED ORDER — ASPIRIN 81 MG PO TBEC
81.0000 mg | DELAYED_RELEASE_TABLET | Freq: Every day | ORAL | Status: DC
  Administered 2023-08-11 – 2023-08-12 (×2): 81 mg via ORAL
  Filled 2023-08-10 (×2): qty 1

## 2023-08-10 MED ORDER — ROCURONIUM BROMIDE 10 MG/ML (PF) SYRINGE
PREFILLED_SYRINGE | INTRAVENOUS | Status: DC | PRN
  Administered 2023-08-10: 50 mg via INTRAVENOUS
  Administered 2023-08-10: 30 mg via INTRAVENOUS
  Administered 2023-08-10: 50 mg via INTRAVENOUS
  Administered 2023-08-10: 20 mg via INTRAVENOUS

## 2023-08-10 MED ORDER — ACETAMINOPHEN 160 MG/5ML PO SOLN: 1000.0000 mg | Freq: Four times a day (QID) | ORAL | Status: DC

## 2023-08-10 MED ORDER — VANCOMYCIN HCL 1500 MG/300ML IV SOLN
1500.0000 mg | INTRAVENOUS | Status: AC
  Administered 2023-08-10: 1500 mg via INTRAVENOUS
  Filled 2023-08-10: qty 300

## 2023-08-10 MED ORDER — BUPROPION HCL ER (XL) 150 MG PO TB24
150.0000 mg | ORAL_TABLET | Freq: Every day | ORAL | Status: DC
Start: 2023-08-11 — End: 2023-08-13
  Administered 2023-08-11 – 2023-08-13 (×3): 150 mg via ORAL
  Filled 2023-08-10 (×3): qty 1

## 2023-08-10 MED ORDER — TRAMADOL HCL 50 MG PO TABS: 50.0000 mg | ORAL_TABLET | Freq: Four times a day (QID) | ORAL | Status: DC | PRN

## 2023-08-10 MED ORDER — SODIUM CHLORIDE 0.9 % IR SOLN
Status: DC | PRN
  Administered 2023-08-10: 1000 mL

## 2023-08-10 MED ORDER — CHLORHEXIDINE GLUCONATE 0.12 % MT SOLN: 15.0000 mL | Freq: Once | OROMUCOSAL | Status: AC

## 2023-08-10 MED ORDER — INDOCYANINE GREEN 25 MG IV SOLR
INTRAVENOUS | Status: DC | PRN
  Administered 2023-08-10: 25 mg via INTRAVENOUS

## 2023-08-10 MED ORDER — GABAPENTIN 300 MG PO CAPS: 300.0000 mg | ORAL_CAPSULE | Freq: Two times a day (BID) | ORAL | Status: DC

## 2023-08-10 MED ORDER — ALBUTEROL SULFATE (2.5 MG/3ML) 0.083% IN NEBU
2.5000 mg | INHALATION_SOLUTION | RESPIRATORY_TRACT | Status: DC
  Administered 2023-08-10 – 2023-08-11 (×3): 2.5 mg via RESPIRATORY_TRACT
  Filled 2023-08-10 (×5): qty 3

## 2023-08-10 MED ORDER — DEXMEDETOMIDINE HCL IN NACL 80 MCG/20ML IV SOLN
INTRAVENOUS | Status: DC | PRN
  Administered 2023-08-10: 8 ug via INTRAVENOUS

## 2023-08-10 MED ORDER — ENOXAPARIN SODIUM 60 MG/0.6ML IJ SOSY
0.5000 mg/kg | PREFILLED_SYRINGE | Freq: Every day | INTRAMUSCULAR | Status: DC
  Administered 2023-08-10 – 2023-08-12 (×3): 55 mg via SUBCUTANEOUS
  Filled 2023-08-10 (×4): qty 0.6

## 2023-08-10 MED ORDER — BISOPROLOL-HYDROCHLOROTHIAZIDE 5-6.25 MG PO TABS
1.0000 | ORAL_TABLET | Freq: Every day | ORAL | Status: DC
  Filled 2023-08-10: qty 1

## 2023-08-10 MED ORDER — PROPOFOL 10 MG/ML IV BOLUS
INTRAVENOUS | Status: DC | PRN
  Administered 2023-08-10: 100 mg via INTRAVENOUS
  Administered 2023-08-10 (×2): 50 mg via INTRAVENOUS
  Administered 2023-08-10: 200 mg via INTRAVENOUS

## 2023-08-10 MED ORDER — ESCITALOPRAM OXALATE 10 MG PO TABS
20.0000 mg | ORAL_TABLET | Freq: Every day | ORAL | Status: DC
  Administered 2023-08-11 – 2023-08-12 (×2): 20 mg via ORAL
  Filled 2023-08-10 (×2): qty 2

## 2023-08-10 MED ORDER — CEFAZOLIN SODIUM-DEXTROSE 2-4 GM/100ML-% IV SOLN
2.0000 g | Freq: Three times a day (TID) | INTRAVENOUS | Status: AC
Start: 2023-08-10 — End: 2023-08-11
  Administered 2023-08-10 – 2023-08-11 (×2): 2 g via INTRAVENOUS
  Filled 2023-08-10 (×2): qty 100

## 2023-08-10 MED ORDER — LIDOCAINE 2% (20 MG/ML) 5 ML SYRINGE
INTRAMUSCULAR | Status: DC | PRN
  Administered 2023-08-10: 60 mg via INTRAVENOUS

## 2023-08-10 MED ORDER — FENTANYL CITRATE (PF) 250 MCG/5ML IJ SOLN
INTRAMUSCULAR | Status: DC | PRN
  Administered 2023-08-10: 100 ug via INTRAVENOUS
  Administered 2023-08-10: 50 ug via INTRAVENOUS
  Administered 2023-08-10: 100 ug via INTRAVENOUS

## 2023-08-10 MED ORDER — PANTOPRAZOLE SODIUM 40 MG PO TBEC
40.0000 mg | DELAYED_RELEASE_TABLET | Freq: Every day | ORAL | Status: DC
  Administered 2023-08-10 – 2023-08-12 (×3): 40 mg via ORAL
  Filled 2023-08-10 (×3): qty 1

## 2023-08-10 MED ORDER — ALPRAZOLAM 0.5 MG PO TABS
1.0000 mg | ORAL_TABLET | Freq: Two times a day (BID) | ORAL | Status: DC | PRN
  Administered 2023-08-11: 1 mg via ORAL
  Filled 2023-08-10: qty 2

## 2023-08-10 MED ORDER — GABAPENTIN 300 MG PO CAPS
300.0000 mg | ORAL_CAPSULE | Freq: Every day | ORAL | Status: DC
  Filled 2023-08-10 (×3): qty 1

## 2023-08-10 MED ORDER — ONDANSETRON HCL 4 MG/2ML IJ SOLN
INTRAMUSCULAR | Status: AC
  Filled 2023-08-10: qty 2

## 2023-08-10 MED ORDER — PHENYLEPHRINE HCL-NACL 20-0.9 MG/250ML-% IV SOLN
INTRAVENOUS | Status: DC | PRN
  Administered 2023-08-10: 50 ug/min via INTRAVENOUS

## 2023-08-10 MED ORDER — HYDROMORPHONE HCL 1 MG/ML IJ SOLN
0.2500 mg | INTRAMUSCULAR | Status: DC | PRN
  Administered 2023-08-10 (×2): 0.5 mg via INTRAVENOUS

## 2023-08-10 MED ORDER — SODIUM CHLORIDE FLUSH 0.9 % IV SOLN
INTRAVENOUS | Status: DC | PRN
  Administered 2023-08-10: 100 mL

## 2023-08-10 MED ORDER — ONDANSETRON HCL 4 MG/2ML IJ SOLN
INTRAMUSCULAR | Status: DC | PRN
  Administered 2023-08-10: 4 mg via INTRAVENOUS

## 2023-08-10 MED ORDER — TRIAMCINOLONE ACETONIDE 0.5 % EX CREA: 1.0000 | TOPICAL_CREAM | Freq: Two times a day (BID) | CUTANEOUS | Status: DC | PRN

## 2023-08-10 MED ORDER — LACTATED RINGERS IV SOLN: INTRAVENOUS | Status: DC | PRN

## 2023-08-10 MED ORDER — FENTANYL CITRATE (PF) 100 MCG/2ML IJ SOLN
25.0000 ug | INTRAMUSCULAR | Status: DC | PRN
  Administered 2023-08-10 (×2): 50 ug via INTRAVENOUS

## 2023-08-10 MED ORDER — BUPIVACAINE HCL (PF) 0.5 % IJ SOLN
INTRAMUSCULAR | Status: AC
  Filled 2023-08-10: qty 30

## 2023-08-10 MED ORDER — CHLORHEXIDINE GLUCONATE CLOTH 2 % EX PADS
6.0000 | MEDICATED_PAD | Freq: Once | CUTANEOUS | Status: AC
  Administered 2023-08-10: 6 via TOPICAL

## 2023-08-10 MED ORDER — 0.9 % SODIUM CHLORIDE (POUR BTL) OPTIME
TOPICAL | Status: DC | PRN
  Administered 2023-08-10: 2000 mL

## 2023-08-10 MED ORDER — ORAL CARE MOUTH RINSE: 15.0000 mL | Freq: Once | OROMUCOSAL | Status: AC

## 2023-08-10 MED ORDER — ENOXAPARIN SODIUM 40 MG/0.4ML IJ SOSY: 40.0000 mg | PREFILLED_SYRINGE | Freq: Every day | INTRAMUSCULAR | Status: DC

## 2023-08-10 MED ORDER — FENTANYL CITRATE PF 50 MCG/ML IJ SOSY
25.0000 ug | PREFILLED_SYRINGE | INTRAMUSCULAR | Status: DC | PRN
  Administered 2023-08-10 – 2023-08-11 (×4): 50 ug via INTRAVENOUS
  Filled 2023-08-10 (×4): qty 1

## 2023-08-10 MED ORDER — LACTATED RINGERS IV SOLN: INTRAVENOUS | Status: DC

## 2023-08-10 MED ORDER — MIDAZOLAM HCL 2 MG/2ML IJ SOLN
INTRAMUSCULAR | Status: DC | PRN
  Administered 2023-08-10: 2 mg via INTRAVENOUS

## 2023-08-10 MED ORDER — HYDROMORPHONE HCL 1 MG/ML IJ SOLN
INTRAMUSCULAR | Status: DC | PRN
  Administered 2023-08-10: .5 mg via INTRAVENOUS

## 2023-08-10 SURGICAL SUPPLY — 89 items
ADH SKN CLS APL DERMABOND .7 (GAUZE/BANDAGES/DRESSINGS) ×1
APPLIER CLIP ROT 10 11.4 M/L (STAPLE)
APR CLP MED LRG 11.4X10 (STAPLE)
BAG TISS RTRVL C300 12X14 (MISCELLANEOUS) ×1
BLADE CLIPPER SURG (BLADE) ×1 IMPLANT
CANISTER SUCT 3000ML PPV (MISCELLANEOUS) ×2 IMPLANT
CANNULA REDUCER 12-8 DVNC XI (CANNULA) ×2 IMPLANT
CLIP APPLIE ROT 10 11.4 M/L (STAPLE) IMPLANT
CLIP TI MEDIUM 6 (CLIP) IMPLANT
CNTNR URN SCR LID CUP LEK RST (MISCELLANEOUS) ×5 IMPLANT
CONN ST 1/4X3/8 BEN (MISCELLANEOUS) IMPLANT
CONT SPEC 4OZ STRL OR WHT (MISCELLANEOUS) ×12
DEFOGGER SCOPE WARMER CLEARIFY (MISCELLANEOUS) ×1 IMPLANT
DERMABOND ADVANCED .7 DNX12 (GAUZE/BANDAGES/DRESSINGS) ×1 IMPLANT
DRAIN CHANNEL 28F RND 3/8 FF (WOUND CARE) IMPLANT
DRAIN CHANNEL 32F RND 10.7 FF (WOUND CARE) IMPLANT
DRAPE ARM DVNC X/XI (DISPOSABLE) ×4 IMPLANT
DRAPE COLUMN DVNC XI (DISPOSABLE) ×1 IMPLANT
DRAPE CV SPLIT W-CLR ANES SCRN (DRAPES) ×1 IMPLANT
DRAPE HALF SHEET 40X57 (DRAPES) ×1 IMPLANT
DRAPE INCISE IOBAN 66X45 STRL (DRAPES) IMPLANT
DRAPE SURG ORHT 6 SPLT 77X108 (DRAPES) ×1 IMPLANT
ELECT BLADE 6.5 EXT (BLADE) IMPLANT
ELECT REM PT RETURN 9FT ADLT (ELECTROSURGICAL) ×1
ELECTRODE REM PT RTRN 9FT ADLT (ELECTROSURGICAL) ×1 IMPLANT
FORCEPS BPLR FENES DVNC XI (FORCEP) IMPLANT
FORCEPS BPLR LNG DVNC XI (INSTRUMENTS) IMPLANT
GAUZE KITTNER 4X5 RF (MISCELLANEOUS) IMPLANT
GAUZE SPONGE 4X4 12PLY STRL (GAUZE/BANDAGES/DRESSINGS) ×1 IMPLANT
GLOVE SS BIOGEL STRL SZ 7.5 (GLOVE) ×1 IMPLANT
GOWN STRL REUS W/ TWL LRG LVL3 (GOWN DISPOSABLE) ×2 IMPLANT
GOWN STRL REUS W/ TWL XL LVL3 (GOWN DISPOSABLE) ×2 IMPLANT
GOWN STRL REUS W/TWL 2XL LVL3 (GOWN DISPOSABLE) ×1 IMPLANT
GOWN STRL REUS W/TWL LRG LVL3 (GOWN DISPOSABLE) ×2
GOWN STRL REUS W/TWL XL LVL3 (GOWN DISPOSABLE) ×2
GRASPER TIP-UP FEN DVNC XI (INSTRUMENTS) IMPLANT
HEMOSTAT SURGICEL 2X14 (HEMOSTASIS) ×4 IMPLANT
IRRIGATION STRYKERFLOW (MISCELLANEOUS) ×1 IMPLANT
IRRIGATOR STRYKERFLOW (MISCELLANEOUS) ×1
KIT BASIN OR (CUSTOM PROCEDURE TRAY) ×1 IMPLANT
NDL HYPO 25GX1X1/2 BEV (NEEDLE) ×1 IMPLANT
NEEDLE HYPO 25GX1X1/2 BEV (NEEDLE) ×1 IMPLANT
NS IRRIG 1000ML POUR BTL (IV SOLUTION) ×1 IMPLANT
PACK CHEST (CUSTOM PROCEDURE TRAY) ×1 IMPLANT
PAD ARMBOARD 7.5X6 YLW CONV (MISCELLANEOUS) ×2 IMPLANT
RELOAD STAPLE 45 2.5 WHT DVNC (STAPLE) IMPLANT
RELOAD STAPLE 45 3.5 BLU DVNC (STAPLE) IMPLANT
RELOAD STAPLE 45 4.3 GRN DVNC (STAPLE) IMPLANT
RELOAD STAPLE 45 4.6 BLK DVNC (STAPLE) IMPLANT
RELOAD STAPLER 2.5X45 WHT DVNC (STAPLE) ×2 IMPLANT
RELOAD STAPLER 3.5X45 BLU DVNC (STAPLE) ×3 IMPLANT
RELOAD STAPLER 4.3X45 GRN DVNC (STAPLE) ×9 IMPLANT
RELOAD STAPLER 45 4.6 BLK DVNC (STAPLE) ×3 IMPLANT
SCISSORS LAP 5X35 DISP (ENDOMECHANICALS) IMPLANT
SEAL UNIV 5-12 XI (MISCELLANEOUS) ×4 IMPLANT
SET TRI-LUMEN FLTR TB AIRSEAL (TUBING) ×1 IMPLANT
SHEARS HARMONIC HDI 20CM (ELECTROSURGICAL) IMPLANT
SOL ELECTROSURG ANTI STICK (MISCELLANEOUS) ×1
SOLUTION ELECTROSURG ANTI STCK (MISCELLANEOUS) ×1 IMPLANT
SPONGE INTESTINAL PEANUT (DISPOSABLE) IMPLANT
SPONGE TONSIL 1 RF SGL (DISPOSABLE) IMPLANT
STAPLER 45 SUREFORM CVD DVNC (STAPLE) IMPLANT
STAPLER 45 SUREFORM DVNC (STAPLE) IMPLANT
STAPLER RELOAD 2.5X45 WHT DVNC (STAPLE) ×2
STAPLER RELOAD 3.5X45 BLU DVNC (STAPLE) ×3
STAPLER RELOAD 4.3X45 GRN DVNC (STAPLE) ×9
STAPLER RELOAD 45 4.6 BLK DVNC (STAPLE) ×3
SUT PDS AB 3-0 SH 27 (SUTURE) IMPLANT
SUT PROLENE 4 0 RB 1 (SUTURE)
SUT PROLENE 4-0 RB1 .5 CRCL 36 (SUTURE) IMPLANT
SUT SILK 1 MH (SUTURE) ×2 IMPLANT
SUT SILK 2 0 SH (SUTURE) IMPLANT
SUT SILK 2 0SH CR/8 30 (SUTURE) IMPLANT
SUT SILK 3 0SH CR/8 30 (SUTURE) IMPLANT
SUT VIC AB 1 CTX 36 (SUTURE)
SUT VIC AB 1 CTX36XBRD ANBCTR (SUTURE) IMPLANT
SUT VIC AB 2-0 CTX 36 (SUTURE) IMPLANT
SUT VIC AB 3-0 X1 27 (SUTURE) ×1 IMPLANT
SUT VICRYL 0 TIES 12 18 (SUTURE) ×1 IMPLANT
SUT VICRYL 0 UR6 27IN ABS (SUTURE) ×2 IMPLANT
SUT VICRYL 2 TP 1 (SUTURE) IMPLANT
SYR 20CC LL (SYRINGE) ×2 IMPLANT
SYSTEM RETRIEVAL ANCHOR 12 (MISCELLANEOUS) IMPLANT
SYSTEM SAHARA CHEST DRAIN ATS (WOUND CARE) ×1 IMPLANT
TAPE CLOTH 4X10 WHT NS (GAUZE/BANDAGES/DRESSINGS) ×1 IMPLANT
TIP APPLICATOR SPRAY EXTEND 16 (VASCULAR PRODUCTS) IMPLANT
TOWEL GREEN STERILE (TOWEL DISPOSABLE) ×1 IMPLANT
TRAY FOLEY MTR SLVR 16FR STAT (SET/KITS/TRAYS/PACK) ×1 IMPLANT
WATER STERILE IRR 1000ML POUR (IV SOLUTION) ×1 IMPLANT

## 2023-08-10 NOTE — Op Note (Signed)
NAME: Shannon [name], Shannon A. MEDICAL RECORD NO: [id number] ACCOUNT NO: [id number] DATE OF BIRTH: Apr 11, 1966 FACILITY: [facility] LOCATION: [facility] PHYSICIAN: Salvatore Decent. Dorris Fetch, MD  Operative Report   DATE OF PROCEDURE: 08/10/2023  PREOPERATIVE DIAGNOSIS:  Left upper lobe lung nodule.  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  PROCEDURE:   Xi Robotic-assisted lingular segmentectomy,  Lymph node dissection, and  Intercostal nerve blocks levels 3 through 10.  SURGEON:  Salvatore Decent. Dorris Fetch, MD  ASSISTANT:  Gershon Crane, PA-C.  ANESTHESIA:  General  FINDINGS:  Poor fissures, inflammatory response around nodes and vessels, level 10 node negative for carcinoma on frozen section.  Nodule was an adenocarcinoma.  Margins were free.  CLINICAL NOTE: Shannon [name] is a 57 year old woman with a history of tobacco use.  She was found to have a lung nodule a couple of years ago.  Initially, there was little change, but more recently a CT  showed a significant increase in size of the lingular nodule.  On PET CT, the nodule was hypermetabolic with no evidence of regional or distant metastatic disease.  Findings were consistent with a stage IA (T1, N0) lung cancer.  She was offered the option of surgical resection for definitive diagnosis and treatment.  The indications, risks, benefits, and alternatives were discussed in detail with the patient.  She understood and accepted the risks and agreed to proceed.  OPERATIVE NOTE: Shannon [name] was brought to the operating room on 08/10/2023.  She had induction of general anesthesia and was intubated with a double-lumen endotracheal tube.  Intravenous antibiotics were administered.  A Foley catheter was placed.   Sequential compression devices were placed on the lower extremities for DVT prophylaxis.  She was placed in a right lateral decubitus position.  A Bair Hugger was placed for active warming.  The left chest was  prepped and draped in the usual sterile fashion.  Single-lung ventilation of the right lung was initiated and was tolerated well throughout the procedure.    A time-out was performed.  A solution containing 20 mL of liposomal bupivacaine, 30 mL of 0.5% bupivacaine, and 50 mL of saline was prepared.  This solution was used for local at the incision sites as well as for the intercostal nerve blocks.  An incision was made in the eighth interspace in the midaxillary line and an 8-mm robotic port was inserted.  After confirming intrapleural placement, carbon dioxide was insufflated per protocol.  A 12-mm robotic port was placed in the eighth interspace anterior to the camera port.  Intercostal nerve blocks were performed from the third to the tenth interspace by injecting 10 mL of the bupivacaine solution into a subpleural plane at each level.  A 12-mm AirSeal port was placed in the 10th interspace posterolaterally and two additional 8th interspace robotic ports were placed.  The robot was deployed.  The camera arm was docked.  Targeting was performed.  The remaining arms were docked.  The robotic instruments were inserted with thoracoscopic visualization.  While waiting for the lung to deflate, the lower lobe was retracted superiorly.  The inferior ligament was divided.  Level 9 and 8 nodes were removed.  All lymph nodes were sent as separate specimens for permanent pathology with the exception of the level 10 nodes sent for frozen.  The lung was retracted anteriorly.  The pleural reflection was divided at the hilum posteriorly and level 7 nodes were removed.  Working further superiorly, pleural reflection was  divided and the aortopulmonary window was explored and level 5 node was removed.  Anteriorly, there was a level 10 node that appeared abnormal.  It was sent for frozen section, which returned with no tumor seen.  The fissure then was explored.  The fissure was incomplete.  It was difficult to achieve a plane  superficial to the pulmonary artery.  It was identified, but it was not easily dissected out as there appeared to be significant inflammation around the lymph nodes and the vessels.  Multiple firings of the robotic stapler were required to complete the fissure anteriorly.  The majority of the fissure was completed posteriorly to allow access to the lingular vessels.  The lingular branches of the superior pulmonary vein were encircled and divided with a robotic stapler.  After removing level 12 and 13 nodes, the lingular pulmonary artery branches were encircled and divided with the stapler as well.  The level 12 node was removed during the dissection of the lingular arterial branches.  The level 13 node was removed during the dissection of the lingular bronchus.  The lingular bronchus was dissected out.  The stapler was placed across it and closed.  A test inflation showed good aeration of the lower lobe and the remainder of the upper lobe.  The staple was fired transecting the lingular bronchus.  10 mL of intravenous ICG was administered.  The firefly setting on the robot was used and the demarcation was marked with cautery.  The segmentectomy was completed with sequential firings of the robotic stapler using green and black cartridges.  The vessel loop and sponges used during the dissection were removed.  The chest was copiously irrigated with saline.  A test inflation to 30 cm of water pressure revealed no leakage from the bronchial stump or the fissure.  A 12-mm endoscopic  retrieval bag was placed into the chest and the lingular segment was advanced into the bag.  It was then brought down to the inferior aspect of the chest.  The robotic instruments were removed.  The robot was undocked.  The anterior eighth interspace incision was lengthened to approximately 3 cm and the specimen was removed through that incision and sent for frozen section.  Frozen section returned showing adenocarcinoma.  The stapled and  bronchial margins were negative for cancer.  A 28-French Blake drain was placed through the original port incision and secured with #1 silk suture.  The remaining incisions were closed in standard fashion.  Dermabond was applied.  The chest tube was placed to Pleur-Evac on waterseal.  The patient was placed back in the supine position.  She was extubated in the operating room and taken to the postanesthesia care unit in good condition.  All sponge, needle, and instrument counts were correct at the end of the procedure.  Experienced assistance was necessary for this case due to surgical complexity.  Gershon Crane assisted with port placement, robot docking and undocking, instrument exchange, specimen retrieval, suctioning, and wound closure.   PUS D: 08/10/2023 6:12:51 pm T: 08/10/2023 6:43:00 pm  JOB: [id number]/ [id number]

## 2023-08-10 NOTE — Brief Op Note (Signed)
08/10/2023  3:23 PM  PATIENT:  Shannon [name]  57 y.o. female  PRE-OPERATIVE DIAGNOSIS:  Lingular nodule  POST-OPERATIVE DIAGNOSIS:  Lingular nodule  PROCEDURE:  Procedure(s): XI ROBOTIC ASSISTED THORACOSCOPY- LEFT UPPER LINGULAR SEGMENTECTOMY (Left) LYMPH NODE DISSECTION (Left) INTERCOSTAL NERVE BLOCK (Left)  SURGEON:  Surgeons and Role:    * Loreli Slot, MD - Primary  PHYSICIAN ASSISTANT: Rexann Lueras PA-C  ANESTHESIA:   local and general  EBL:  150 ML  BLOOD ADMINISTERED:none  DRAINS: (1 28 F ) Blake drain(s) in the LEFT HEMITHORAX    LOCAL MEDICATIONS USED:  BUPIVICAINE  and  EXPAREL  SPECIMEN:  Source of Specimen:  LUL LINGULAR SEGMENTECTOMY AND MULTIPLE LN SAMPLES  DISPOSITION OF SPECIMEN:  PATHOLOGY  COUNTS:  YES  TOURNIQUET:  * No tourniquets in log *  DICTATION: .Other Dictation: Dictation Number [id number]  PLAN OF CARE: Admit to inpatient   PATIENT DISPOSITION:  PACU - hemodynamically stable.   Delay start of Pharmacological VTE agent (>24hrs) due to surgical blood loss or risk of bleeding: no  COMPLICATIONS: NO KNOWN

## 2023-08-10 NOTE — Anesthesia Procedure Notes (Addendum)
Arterial Line Insertion Start/End10/15/2024 11:01 AM, 07/29/2023 11:01 AM Performed by: Gwenyth Allegra, CRNA, CRNA  Patient location: Pre-op. Preanesthetic checklist: patient identified, IV checked, site marked, risks and benefits discussed, surgical consent, monitors and equipment checked, pre-op evaluation, timeout performed and anesthesia consent Lidocaine 1% used for infiltration Right, radial was placed Catheter size: 22 G Hand hygiene performed  and maximum sterile barriers used   Attempts: 1 Procedure performed using ultrasound guided technique. Ultrasound Notes:anatomy identified, needle tip was noted to be adjacent to the nerve/plexus identified and no ultrasound evidence of intravascular and/or intraneural injection Following insertion, dressing applied and Biopatch. Post procedure assessment: normal and unchanged  Patient tolerated the procedure well with no immediate complications.

## 2023-08-10 NOTE — Transfer of Care (Signed)
Immediate Anesthesia Transfer of Care Note  Patient: Shannon [name]  Procedure(s) Performed: XI ROBOTIC ASSISTED THORACOSCOPY- LEFT UPPER LINGULAR SEGMENTECTOMY (Left: Chest) LYMPH NODE DISSECTION (Left: Chest) INTERCOSTAL NERVE BLOCK (Left: Chest)  Patient Location: PACU  Anesthesia Type:General  Level of Consciousness: sedated  Airway & Oxygen Therapy: Patient Spontanous Breathing and Patient connected to face mask oxygen  Post-op Assessment: Report given to RN and Post -op Vital signs reviewed and stable  Post vital signs: Reviewed and stable  Last Vitals:  Vitals Value Taken Time  BP    Temp    Pulse 68 08/10/23 1541  Resp 18 08/10/23 1541  SpO2 93 % 08/10/23 1541  Vitals shown include unfiled device data.  Last Pain:  Vitals:   08/10/23 0920  TempSrc:   PainSc: 6       Patients Stated Pain Goal: 2 (08/10/23 0920)  Complications: No notable events documented.

## 2023-08-10 NOTE — Anesthesia Procedure Notes (Addendum)
Procedure Name: Intubation Date/Time: 08/10/2023 11:30 AM  Performed by: Gwenyth Allegra, CRNAPre-anesthesia Checklist: Patient identified, Emergency Drugs available, Suction available and Patient being monitored Patient Re-evaluated:Patient Re-evaluated prior to induction Oxygen Delivery Method: Circle system utilized Preoxygenation: Pre-oxygenation with 100% oxygen Induction Type: IV induction Ventilation: Mask ventilation without difficulty and Oral airway inserted - appropriate to patient size Laryngoscope Size: Miller and 2 Grade View: Grade I Tube type: Oral Endobronchial tube: Double lumen EBT and Left and 37 Fr Number of attempts: 1 Airway Equipment and Method: Stylet Placement Confirmation: ETT inserted through vocal cords under direct vision, positive ETCO2, CO2 detector and breath sounds checked- equal and bilateral Secured at: 27 cm Tube secured with: Tape Dental Injury: Teeth and Oropharynx as per pre-operative assessment

## 2023-08-10 NOTE — Hospital Course (Signed)
PCP is Verl Blalock Referring Provider is Heilingoetter, Software engineer*   HPI: At the time of thoracic surgical consultation   Shannon [name] is sent for consultation regarding a lingular nodule.   She was found to have a nodule in the lingular segment of the left upper lobe a couple of years ago.  It was monitored with little change initially.  In early August 2024 she had an episode of chest pain and went to the emergency room.  She had a CT angiogram.  There was no pulmonary embolus.  However the lingular nodule had grown in comparison to her prior films.  She ruled out for MI.   She saw Dr. Arbutus Ped.  A PET/CT was done which showed the nodule was markedly hypermetabolic.  There was no evidence of regional or distant metastatic disease.   She has not had any additional chest pain since that incident.  Activity is limited due to hip and back pain.  She says she did walk up the 4 flights of stairs to our office because she is claustrophobic and did not want to get on an elevator.  She was short of breath at the top.  She can walk 1 flight without stopping.  No change in appetite or weight loss.  No unusual headaches or visual changes.  Following full review of the patient and her studies it was Dr. Sunday Corn recommendation to proceed with resection.  Hospital course: The patient was admitted electively on 08/10/2023 and taken the operating room at which time she underwent a robotic assisted left upper lobe lingular segmentectomy.  He tolerated the procedure well was taken to the postanesthesia care unit in stable condition.

## 2023-08-10 NOTE — Discharge Summary (Addendum)
Physician Discharge Summary       [address] [place]       [place] [address]             [phone]    Patient ID: Shannon [name] MRN: [id number] DOB/AGE: 1966/01/29 57 y.o.  Admit date: 08/10/2023 Discharge date: 08/13/2023  Admission Diagnoses:  Pulmonary nodule left lingula Chronic diastolic heart failure Coronary artery disease Tobacco abuse History of asthma History of DVT  Discharge Diagnoses:   Non-small cell carcinoma left upper lobe, clinical and pathologic stage IA (T1, N0) Chronic diastolic heart failure Coronary artery disease Tobacco abuse History of asthma History of DVT Status post left lingular segmentectomy    Consults: None   PCP is [name], PA-C Referring Provider is Heilingoetter, Cassandr   HPI: At the time of thoracic surgical consultation   Mrs. Schild is sent for consultation regarding a lingular nodule.   She was found to have a nodule in the lingular segment of the left upper lobe a couple of years ago.  It was monitored with little change initially.  In early August 2024 she had an episode of chest pain and went to the emergency room.  She had a CT angiogram.  There was no pulmonary embolus.  However the lingular nodule had grown in comparison to her prior films.  She ruled out for MI.   She saw Dr. Arbutus Ped.  A PET/CT was done which showed the nodule was markedly hypermetabolic.  There was no evidence of regional or distant metastatic disease.   She has not had any additional chest pain since that incident.  Activity is limited due to hip and back pain.  She says she did walk up the 4 flights of stairs to our office because she is claustrophobic and did not want to get on an elevator.  She was short of breath at the top.  She can walk 1 flight without stopping.  No change in appetite or weight loss.  No unusual headaches or visual changes.  Following full review of the patient and her studies it was Dr.  Sunday Corn recommendation to proceed with resection.  Hospital Course:   The patient was admitted electively on 08/10/2023 and taken the operating room at which time she underwent a robotic assisted left upper lobe lingular segmentectomy.  She tolerated the procedure well was taken to the postanesthesia care unit in stable condition and later to [facility].  Respiratory status remained stable.  She was weaned off of the supplemental oxygen by the morning of the first postop day.  Follow-up chest x-ray postop day 1 showed both lungs to be well-expanded.  There was no airleak from the chest tube but drainage was about 400 mL in the time of surgery.  The chest tube was left in place for drainage.  Patient was mobilized.  Diet and activity were advanced.  She was started back on her usual hypertension medication along with Crestor and daily aspirin.  Daily Lovenox was administered for DVT prophylaxis. Chest tube output decreased and there was no air leak. Chest tube was removed on 11/13. Same day chest x ray showed **. PA/LAT CXR then showed **. All wounds are clean, dry, healing without signs of infection. Patient is stable for discharge today.    Procedure: Operative Report    DATE OF PROCEDURE: 08/10/2023   PREOPERATIVE DIAGNOSIS:  Left upper lobe lung nodule.   POSTOPERATIVE DIAGNOSIS:  Non-small cell carcinoma of the left upper lobe,  clinical stage IA (T1, N0).   PROCEDURE:  Xi Robotic-assisted lingular segmentectomy, lymph node dissection, and intercostal nerve blocks levels 3 through 10.   SURGEON:  Salvatore Decent. Dorris Fetch, MD   ASSISTANT:  Gershon Crane.   ANESTHESIA:  General   FINDINGS:  Poor fissures, inflammatory responses around nodes and vessels, level 10 node negative for carcinoma on frozen section.  Nodule was an adenocarcinoma.  Margins were free.  Pathology:   FINAL MICROSCOPIC DIAGNOSIS:  A. LYMPH NODE, LEVEL 10, BIOPSY:      One lymph node, negative for  metastatic carcinoma (0/1).  B. LUNG, LEFT LINGULAR, SEGMENTECTOMY:      Invasive mucinous adenocarcinoma, moderately-differentiated.      Tumor size: 2.0 cm.      Spread through air space identified.      Visceral pleura invasion not identified.      Lymphovascular invasion not identified.      Surgical margins of resection are negative for carcinoma.      See oncology table.  C. LYMPH NODE, LEVEL 8, BIOPSY:      One lymph node, negative for metastatic carcinoma (0/1).  D. LYMPH NODE, LEVEL 9, BIOPSY:      One lymph node, negative for metastatic carcinoma (0/1).  E. LYMPH NODE, LEVEL 8 #2, BIOPSY:      One lymph node, negative for metastatic carcinoma (0/1).  F. LYMPH NODE, LEVEL 7, BIOPSY: One lymph node, negative for metastatic carcinoma (0/1).  G. LYMPH NODE, LEVEL 7#2, BIOPSY:      One lymph node, negative for metastatic carcinoma (0/1).  H. LYMPH NODE, LEVEL 5, BIOPSY:      One lymph node, negative for metastatic carcinoma (0/1).  I. LYMPH NODE, LEVEL 11, BIOPSY:      One lymph node, negative for metastatic carcinoma (0/1).  J. LYMPH NODE, LEVEL 12, BIOPSY:      One lymph node, negative for metastatic carcinoma (0/1).  K. LYMPH NODE, LEVEL 13, BIOPSY:      One lymph node, negative for metastatic carcinoma (0/1). TNM Code:pT1b, pN0   Latest Vital Signs: Blood pressure 95/62, pulse (!) 59, temperature 98.5 F (36.9 C), temperature source Oral, resp. rate 14, height 5\' 5"  (1.651 m), weight 108.9 kg, last menstrual period 07/01/2015, SpO2 100%.  Physical Exam: Cardiovascular: RRR Pulmonary: Clear to auscultation on the right;rub with chest tube on left Abdomen: Soft, non tender, bowel sounds present. Extremities: SCDs in place Wounds: Clean and dry.  No erythema or signs of infection.  Discharge Condition: Stable and discharged to home.  Recent laboratory studies:  Lab Results  Component Value Date   WBC 7.9 08/12/2023   HGB 12.5 08/12/2023   HCT 38.7  08/12/2023   MCV 90.6 08/12/2023   PLT 126 (L) 08/12/2023   Lab Results  Component Value Date   NA 135 08/12/2023   K 3.6 08/12/2023   CL 102 08/12/2023   CO2 26 08/12/2023   CREATININE 1.01 (H) 08/12/2023   GLUCOSE 115 (H) 08/12/2023      Diagnostic Studies:   Narrative & Impression  CLINICAL DATA:  Pneumothorax.   EXAM: CHEST - 2 VIEW   COMPARISON:  Chest x-ray from yesterday.   FINDINGS: Stable cardiomediastinal silhouette with stable medial left lung opacity and postsurgical changes silhouetting the left heart border. Trace left apical pneumothorax. Trace left pleural effusion. No acute osseous abnormality.   IMPRESSION: 1. Trace left apical pneumothorax and pleural effusion. 2. Stable postsurgical changes and medial left lung opacity.  Electronically Signed   By: Obie Dredge M.D.   On: 08/13/2023 09:51    DG Chest 2 View  Result Date: 08/13/2023 CLINICAL DATA:  Pneumothorax. EXAM: CHEST - 2 VIEW COMPARISON:  Chest x-ray from yesterday. FINDINGS: Stable cardiomediastinal silhouette with stable medial left lung opacity and postsurgical changes silhouetting the left heart border. Trace left apical pneumothorax. Trace left pleural effusion. No acute osseous abnormality. IMPRESSION: 1. Trace left apical pneumothorax and pleural effusion. 2. Stable postsurgical changes and medial left lung opacity. Electronically Signed   By: Obie Dredge M.D.   On: 08/13/2023 09:51   DG Chest Port 1V same Day  Result Date: 08/12/2023 CLINICAL DATA:  Postop EXAM: PORTABLE CHEST 1 VIEW COMPARISON:  08/12/2023, 08/11/2023, 08/10/2023 FINDINGS: Interim removal of left-sided chest tube. Stable trace left apical pneumothorax. Postsurgical changes at the left mid lung. Atelectasis left base. Similar hazy left lung opacity IMPRESSION: 1. Interim removal of left-sided chest tube. Stable trace left apical pneumothorax. 2. Similar hazy left lung opacity and left base atelectasis.  Electronically Signed   By: Jasmine Pang M.D.   On: 08/12/2023 17:07   DG CHEST PORT 1 VIEW  Result Date: 08/12/2023 CLINICAL DATA:  [id number] Postop check [id number]. EXAM: PORTABLE CHEST 1 VIEW COMPARISON:  Chest radiograph 08/11/2023. FINDINGS: Unchanged left-sided chest tube with evolving postoperative changes from left-sided chest surgery. Slightly improved aeration of the lungs. No focal consolidation or pulmonary edema. Stable cardiac and mediastinal contours. No pleural effusion. Possible trace left apical pneumothorax. IMPRESSION: Unchanged left-sided chest tube with evolving postoperative changes from left-sided chest surgery. Possible trace left apical pneumothorax. Electronically Signed   By: Orvan Falconer M.D.   On: 08/12/2023 11:25   DG Chest Port 1 View  Result Date: 08/11/2023 CLINICAL DATA:  Lung mass EXAM: PORTABLE CHEST 1 VIEW COMPARISON:  X-ray 08/10/2023 FINDINGS: Left-sided chest tube again seen. Increasing hazy left midlung opacity. No pneumothorax or effusion. Normal cardiopericardial silhouette. Overlapping cardiac leads. Film is slightly rotated. IMPRESSION: Left-sided chest tube. Previous pneumothorax is less appreciated today. Increasing left midlung opacity. Electronically Signed   By: Karen Kays M.D.   On: 08/11/2023 12:12   DG Chest Port 1 View  Result Date: 08/10/2023 CLINICAL DATA:  Lung mass, postoperative. EXAM: PORTABLE CHEST 1 VIEW COMPARISON:  Chest CT 05/06/1999 FINDINGS: Left-sided chest tube in place. There are new surgical staple lines throughout the left mid lung. There is mild patchy airspace disease in the left mid lung at the level of the recent surgery. There is no significant pleural effusion. There is a small left sided pneumothorax. The right lung is clear. The cardiomediastinal silhouette is within limits. The osseous structures are stable. IMPRESSION: 1. Small left-sided pneumothorax with chest tube in place. 2. Mild patchy airspace disease in the left  mid lung at the level of the recent surgery. Electronically Signed   By: Darliss Cheney M.D.   On: 08/10/2023 20:57   DG Chest 2 View  Result Date: 08/06/2023 CLINICAL DATA:  Preoperative exam. EXAM: CHEST - 2 VIEW COMPARISON:  Chest radiograph 05/06/2023 FINDINGS: Stable cardiac and mediastinal contours. Left mid lung nodule not well demonstrated on current exam. No pleural effusion or pneumothorax. Thoracic spine degenerative changes. IMPRESSION: No active cardiopulmonary disease. Electronically Signed   By: Annia Belt M.D.   On: 08/06/2023 21:21   NM Myocar Multi W/Spect W/Wall Motion / EF  Result Date: 07/28/2023   Stress ECG is negative for ischemia and arrhythmias.   LV perfusion is  normal. There is no evidence of ischemia. There is no evidence of infarction.   Left ventricular function is normal. Nuclear stress EF: 75%.   Findings are consistent with no ischemia and no infarction. The study is low risk.       Discharge Medications: Allergies as of 08/13/2023       Reactions   Clomiphene Citrate Nausea And Vomiting   Doxycycline Other (See Comments)   Morphine And Codeine Other (See Comments)   PATIENT REFUSES THIS MEDICATION: states that she does not tolerate this medication well   Other    General anesthesia causes depressive symptoms    Sulfa Antibiotics Other (See Comments)   Not sure ? rash   Clomiphene Rash        Medication List     TAKE these medications    acetaminophen 500 MG tablet Commonly known as: TYLENOL Take 500 mg by mouth every 6 (six) hours as needed for moderate pain (pain score 4-6).   albuterol 108 (90 Base) MCG/ACT inhaler Commonly known as: VENTOLIN HFA Inhale 2 puffs into the lungs every 4 (four) hours as needed for wheezing or shortness of breath.   ALPRAZolam 1 MG tablet Commonly known as: XANAX Take 1 mg by mouth 2 (two) times daily as needed for anxiety.   aspirin EC 81 MG tablet Take 1 tablet (81 mg total) by mouth daily. Swallow  whole. What changed: when to take this   bismuth subsalicylate 262 MG chewable tablet Commonly known as: PEPTO BISMOL Chew 524 mg by mouth as needed for diarrhea or loose stools.   bisoprolol-hydrochlorothiazide 5-6.25 MG tablet Commonly known as: ZIAC Take 1 tablet by mouth at bedtime.   buPROPion 150 MG 24 hr tablet Commonly known as: WELLBUTRIN XL Take 150 mg by mouth daily.   dextromethorphan 15 MG/5ML syrup Take 10 mLs by mouth at bedtime as needed for cough.   escitalopram 20 MG tablet Commonly known as: LEXAPRO Take 20 mg by mouth at bedtime.   fluticasone 50 MCG/ACT nasal spray Commonly known as: FLONASE Place 2 sprays into both nostrils at bedtime.   furosemide 20 MG tablet Commonly known as: LASIX Take 1 tablet (20 mg total) by mouth daily as needed.   gabapentin 300 MG capsule Commonly known as: NEURONTIN Take 1 capsule (300 mg total) by mouth at bedtime.   nitroGLYCERIN 0.4 MG SL tablet Commonly known as: NITROSTAT Place 0.4 mg under the tongue every 5 (five) minutes as needed for chest pain.   oxyCODONE 5 MG immediate release tablet Commonly known as: Oxy IR/ROXICODONE Take 1 tablet (5 mg total) by mouth every 6 (six) hours as needed for severe pain (pain score 7-10). What changed:  medication strength how much to take when to take this reasons to take this   pantoprazole 40 MG tablet Commonly known as: PROTONIX Take 40 mg by mouth at bedtime.   rosuvastatin 10 MG tablet Commonly known as: CRESTOR Take 10 mg by mouth at bedtime.   triamcinolone cream 0.5 % Commonly known as: KENALOG Apply 1 Application topically 2 (two) times daily as needed (rash).        Follow Up Appointments:  Follow-up Information     Loreli Slot, MD. Go on 08/25/2023.   Specialty: Cardiothoracic Surgery Why: Your appointment with Dr. Dorris Fetch is at 1:30pm. Please obtain a chest x-ray 1 hour before the appointment at [facility] located at [address]. Contact information: [address]  [place] [phone]         [facility]. Go on 08/25/2023.   Why: On the date of your appointment to see Dr. Dorris Fetch in the office please obtain a chest x-ray at [facility] 1 hour prior to the appointment. Contact information: [address] [place] [address]                Signed: Lelon Huh [facility] 08/13/2023, 10:17 AM

## 2023-08-10 NOTE — Interval H&P Note (Signed)
History and Physical Interval Note:  Cleared by cardiology No interval change  08/10/2023 10:25 AM  Shannon [name]  has presented today for surgery, with the diagnosis of Lingular nodule.  The various methods of treatment have been discussed with the patient and family. After consideration of risks, benefits and other options for treatment, the patient has consented to  Procedure(s): XI ROBOTIC ASSISTED THORACOSCOPY- LEFT UPPER LINGULAR SEGMENTECTOMY (Left) as a surgical intervention.  The patient's history has been reviewed, patient examined, no change in status, stable for surgery.  I have reviewed the patient's chart and labs.  Questions were answered to the patient's satisfaction.     Loreli Slot

## 2023-08-11 ENCOUNTER — Inpatient Hospital Stay (HOSPITAL_COMMUNITY): Payer: Medicare HMO

## 2023-08-11 ENCOUNTER — Encounter (HOSPITAL_COMMUNITY): Payer: Self-pay | Admitting: Thoracic Surgery (Cardiothoracic Vascular Surgery)

## 2023-08-11 LAB — POCT I-STAT 7, (LYTES, BLD GAS, ICA,H+H)
Acid-base deficit: 3 mmol/L — ABNORMAL HIGH (ref 0.0–2.0)
Bicarbonate: 23.8 mmol/L (ref 20.0–28.0)
Calcium, Ion: 1.17 mmol/L (ref 1.15–1.40)
HCT: 40 % (ref 36.0–46.0)
Hemoglobin: 13.6 g/dL (ref 12.0–15.0)
O2 Saturation: 98 %
Potassium: 3.9 mmol/L (ref 3.5–5.1)
Sodium: 137 mmol/L (ref 135–145)
TCO2: 25 mmol/L (ref 22–32)
pCO2 arterial: 46.2 mm[Hg] (ref 32–48)
pH, Arterial: 7.32 — ABNORMAL LOW (ref 7.35–7.45)
pO2, Arterial: 105 mm[Hg] (ref 83–108)

## 2023-08-11 LAB — CBC
HCT: 42.2 % (ref 36.0–46.0)
Hemoglobin: 13.2 g/dL (ref 12.0–15.0)
MCH: 28.6 pg (ref 26.0–34.0)
MCHC: 31.3 g/dL (ref 30.0–36.0)
MCV: 91.3 fL (ref 80.0–100.0)
Platelets: 139 10*3/uL — ABNORMAL LOW (ref 150–400)
RBC: 4.62 MIL/uL (ref 3.87–5.11)
RDW: 15.3 % (ref 11.5–15.5)
WBC: 10.4 10*3/uL (ref 4.0–10.5)
nRBC: 0 % (ref 0.0–0.2)

## 2023-08-11 LAB — BASIC METABOLIC PANEL
Anion gap: 10 (ref 5–15)
BUN: 8 mg/dL (ref 6–20)
CO2: 23 mmol/L (ref 22–32)
Calcium: 8.4 mg/dL — ABNORMAL LOW (ref 8.9–10.3)
Chloride: 101 mmol/L (ref 98–111)
Creatinine, Ser: 0.96 mg/dL (ref 0.44–1.00)
GFR, Estimated: >60 mL/min (ref >60)
Glucose, Bld: 117 mg/dL — ABNORMAL HIGH (ref 70–99)
Potassium: 4 mmol/L (ref 3.5–5.1)
Sodium: 134 mmol/L — ABNORMAL LOW (ref 135–145)

## 2023-08-11 MED ORDER — BISOPROLOL FUMARATE 5 MG PO TABS
5.0000 mg | ORAL_TABLET | Freq: Every day | ORAL | Status: DC
  Administered 2023-08-11 – 2023-08-12 (×2): 5 mg via ORAL
  Filled 2023-08-11 (×2): qty 1

## 2023-08-11 MED ORDER — HYDROCHLOROTHIAZIDE 12.5 MG PO TABS
6.2500 mg | ORAL_TABLET | Freq: Every day | ORAL | Status: DC
  Administered 2023-08-11 – 2023-08-12 (×2): 6.25 mg via ORAL
  Filled 2023-08-11 (×2): qty 1

## 2023-08-11 NOTE — Discharge Instructions (Signed)
[facility] [id number]   Robot-Assisted Thoracic Surgery, Care After The following information offers guidance on how to care for yourself after your procedure. Your health care provider may also give you more specific instructions. If you have problems or questions, contact your health care provider. What can I expect after the procedure? After the procedure, it is common to have: Some pain and aches in the area of your surgical incisions. Pain when breathing in (inhaling) and coughing. Tiredness (fatigue). Trouble sleeping. Constipation. Follow these instructions at home: Medicines Take over-the-counter and prescription medicines only as told by your health care provider. If you were prescribed an antibiotic medicine, take it as told by your health care provider. Do not stop taking the antibiotic even if you start to feel better. Talk with your health care provider about safe and effective ways to manage pain after your procedure. Pain management should fit your specific health needs. Take pain medicine before pain becomes severe. Relieving and controlling your pain will make breathing easier for you. Ask your health care provider if the medicine prescribed to you requires you to avoid driving or using machinery. Eating and drinking Follow instructions from your health care provider about eating or drinking restrictions. These will vary depending on what procedure you had. Your health care provider may recommend: A liquid diet or soft diet for the first few days. Meals that are smaller and more frequent. A diet of fruits, vegetables, whole grains, and low-fat proteins. Limiting foods that are high in fat and processed sugar, including fried or sweet foods. Incision care Follow instructions from your health care provider about how to take care of your incisions. Make sure you: Wash your hands with soap and water for at least 20 seconds before and after you change  your bandage (dressing). If soap and water are not available, use hand sanitizer. Change your dressing as told by your health care provider. Leave stitches (sutures), skin glue, or adhesive strips in place. These skin closures may need to stay in place for 2 weeks or longer. If adhesive strip edges start to loosen and curl up, you may trim the loose edges. Do not remove adhesive strips completely unless your health care provider tells you to do that. Check your incision area every day for signs of infection. Check for: Redness, swelling, or more pain. Fluid or blood. Warmth. Pus or a bad smell. Activity Return to your normal activities as told by your health care provider. Ask your health care provider what activities are safe for you. Ask your health care provider when it is safe for you to drive. Do not lift anything that is heavier than 10 lb (4.5 kg), or the limit that you are told, until your health care provider says that it is safe. Rest as told by your health care provider. Avoid sitting for a long time without moving. Get up to take short walks every 1-2 hours. This is important to improve blood flow and breathing. Ask for help if you feel weak or unsteady. Do exercises as told by your health care provider. Pneumonia prevention  Do deep breathing exercises and cough regularly as directed. This helps clear mucus and opens your lungs. Doing this helps prevent lung infection (pneumonia). If you were given an incentive spirometer, use it as told. An incentive spirometer is a tool that measures how well you are filling your lungs with each breath. Coughing may hurt less if you try to support your chest. This is  called splinting. Try one of these when you cough: Hold a pillow against your chest. Place the palms of both hands on top of your incision area. Do not use any products that contain nicotine or tobacco. These products include cigarettes, chewing tobacco, and vaping devices, such as  e-cigarettes. If you need help quitting, ask your health care provider. Avoid secondhand smoke. General instructions If you have a drainage tube: Follow instructions from your health care provider about how to take care of it. Do not travel by airplane after your tube is removed until your health care provider tells you it is safe. You may need to take these actions to prevent or treat constipation: Drink enough fluid to keep your urine pale yellow. Take over-the-counter or prescription medicines. Eat foods that are high in fiber, such as beans, whole grains, and fresh fruits and vegetables. Limit foods that are high in fat and processed sugars, such as fried or sweet foods. Keep all follow-up visits. This is important. Contact a health care provider if: You have redness, swelling, or more pain around an incision. You have fluid or blood coming from an incision. An incision feels warm to the touch. You have pus or a bad smell coming from an incision. You have a fever. You cannot eat or drink without vomiting. Your pain medicine is not controlling your pain. Get help right away if: You have chest pain. Your heart is beating quickly. You have trouble breathing. You have trouble speaking. You are confused. You feel weak or dizzy, or you faint. These symptoms may represent a serious problem that is an emergency. Do not wait to see if the symptoms will go away. Get medical help right away. Call your local emergency services (911 in the [place] Do not drive yourself to the hospital. Summary Talk with your health care provider about safe and effective ways to manage pain after your procedure. Pain management should fit your specific health needs. Return to your normal activities as told by your health care provider. Ask your health care provider what activities are safe for you. Do deep breathing exercises and cough regularly as directed. This helps to clear mucus and prevent pneumonia. If it  hurts to cough, ease pain by holding a pillow against your chest or by placing the palms of both hands over your incisions. This information is not intended to replace advice given to you by your health care provider. Make sure you discuss any questions you have with your health care provider. Document Revised: 06/07/2020 Document Reviewed: 06/08/2020 Elsevier Patient Education  2024 [employer].  Information on my medicine - ELIQUIS (apixaban)  Why was Eliquis prescribed for you? Eliquis was prescribed for you to reduce the risk of a blood clot forming that can cause a stroke if you have a medical condition called atrial fibrillation (a type of irregular heartbeat).  What do You need to know about Eliquis ? Take your Eliquis TWICE DAILY - one tablet in the morning and one tablet in the evening with or without food. If you have difficulty swallowing the tablet whole please discuss with your pharmacist how to take the medication safely.  Take Eliquis exactly as prescribed by your doctor and DO NOT stop taking Eliquis without talking to the doctor who prescribed the medication.  Stopping may increase your risk of developing a stroke.  Refill your prescription before you run out.  After discharge, you should have regular check-up appointments with your healthcare provider that is prescribing your

## 2023-08-11 NOTE — Progress Notes (Addendum)
Mobility Specialist Progress Note:   08/11/23 0900  Mobility  Activity Ambulated with assistance in hallway  Level of Assistance Contact guard assist, steadying assist  Assistive Device Front wheel walker  Distance Ambulated (ft) 100 ft  Activity Response Tolerated well  Mobility Referral Yes  $Mobility charge 1 Mobility  Mobility Specialist Start Time (ACUTE ONLY) 0848  Mobility Specialist Stop Time (ACUTE ONLY) 0904  Mobility Specialist Time Calculation (min) (ACUTE ONLY) 16 min    During Mobility: 90 HR Post Mobility:  82 HR  Pt received in bed, agreeable to mobility. C/o L shoulder pain, from chest tube and some fatigue. Otherwise asymptomatic. Pt left on EOB with call bell and RN present.  D'Vante Earlene Plater Mobility Specialist Please contact via Special educational needs teacher or Rehab office at [phone]

## 2023-08-11 NOTE — Progress Notes (Signed)
      [address]       [place] [address]             [phone]      1 Day Post-Op Procedure(s) (LRB): XI ROBOTIC ASSISTED THORACOSCOPY- LEFT UPPER LINGULAR SEGMENTECTOMY (Left) LYMPH NODE DISSECTION (Left) INTERCOSTAL NERVE BLOCK (Left) Subjective: Awake and alert, complains of primarily left shoulder pain.   On RA, O2 sat 96%.   Objective: Vital signs in last 24 hours: Temp:  [97.2 F (36.2 C)-98.8 F (37.1 C)] 98 F (36.7 C) (11/12 0437) Pulse Rate:  [63-76] 76 (11/12 0600) Cardiac Rhythm: Normal sinus rhythm (11/11 2100) Resp:  [12-26] 20 (11/12 0600) BP: (102-139)/(57-79) 107/57 (11/12 0600) SpO2:  [92 %-98 %] 98 % (11/12 0600) Arterial Line BP: (115-142)/(60-73) 115/60 (11/11 1645) Weight:  [108.9 kg] 108.9 kg (11/11 0908)  Hemodynamic parameters for last 24 hours:    Intake/Output from previous day: 11/11 0701 - 11/12 0700 In: 1550 [I.V.:1300; IV Piggyback:250] Out: 3326 [Urine:2900; Blood:25; Chest Tube:401] Intake/Output this shift: No intake/output data recorded.  General appearance: alert, cooperative, and mild distress Neurologic: intact Heart: RRR, Monitor showing NSR Lungs: breath sounds clear on the right , coarse on the left. CT drainage ~471ml since surgery. Drainage is this serous fluid. No air leak. CXR shjowing full expansion bilat, has diffuse opacity over leve lower lung zone.  Wound: left chest dressing has some serous drainage. The tube is secure.   Lab Results: Recent Labs    08/10/23 1210 08/11/23 0244  WBC  --  10.4  HGB 13.6 13.2  HCT 40.0 42.2  PLT  --  139*   BMET:  Recent Labs    08/10/23 1210 08/11/23 0244  NA 137 134*  K 3.9 4.0  CL  --  101  CO2  --  23  GLUCOSE  --  117*  BUN  --  8  CREATININE  --  0.96  CALCIUM  --  8.4*    PT/INR: No results for input(s): "LABPROT", "INR" in the last 72 hours. ABG    Component Value Date/Time   PHART 7.320 (L) 08/10/2023 1210   HCO3 23.8 08/10/2023  1210   TCO2 25 08/10/2023 1210   ACIDBASEDEF 3.0 (H) 08/10/2023 1210   O2SAT 98 08/10/2023 1210   CBG (last 3)  No results for input(s): "GLUCAP" in the last 72 hours.  Assessment/Plan: S/P Procedure(s) (LRB): XI ROBOTIC ASSISTED THORACOSCOPY- LEFT UPPER LINGULAR SEGMENTECTOMY (Left) LYMPH NODE DISSECTION (Left) INTERCOSTAL NERVE BLOCK (Left)  -POD1 left lingular segmentectomy for NSCLC.  Stable respiratory status.  No air leak. Leaving the chest tube for drainage. Current smoker, needs to work on pulmonary hygiene. Mobilze, d/c the IVF.   -History of HTN- BP control reasonable.  To resume her Ziac today.   -GI- advance diet as tolerated, Protonix for h/o GERD  -DVT  PPX- H/O DVT, ambulate, on Enoxaparin.    LOS: 1 day    Leary Roca, PA-C [phone] 08/11/2023

## 2023-08-11 NOTE — Anesthesia Postprocedure Evaluation (Signed)
Anesthesia Post Note  Patient: Shannon [name]  Procedure(s) Performed: XI ROBOTIC ASSISTED THORACOSCOPY- LEFT UPPER LINGULAR SEGMENTECTOMY (Left: Chest) LYMPH NODE DISSECTION (Left: Chest) INTERCOSTAL NERVE BLOCK (Left: Chest)     Patient location during evaluation: PACU Anesthesia Type: General Level of consciousness: awake and alert Pain management: pain level controlled Vital Signs Assessment: post-procedure vital signs reviewed and stable Respiratory status: spontaneous breathing, nonlabored ventilation, respiratory function stable and patient connected to nasal cannula oxygen Cardiovascular status: blood pressure returned to baseline and stable Postop Assessment: no apparent nausea or vomiting Anesthetic complications: no   No notable events documented.  Last Vitals:  Vitals:   08/11/23 0437 08/11/23 0600  BP: 128/61 (!) 107/57  Pulse: 71 76  Resp: 18 20  Temp: 36.7 C   SpO2: 98% 98%    Last Pain:  Vitals:   08/11/23 0700  TempSrc:   PainSc: 0-No pain                 Ronte Parker S

## 2023-08-12 ENCOUNTER — Inpatient Hospital Stay (HOSPITAL_COMMUNITY): Payer: Medicare HMO

## 2023-08-12 LAB — CBC
HCT: 38.7 % (ref 36.0–46.0)
Hemoglobin: 12.5 g/dL (ref 12.0–15.0)
MCH: 29.3 pg (ref 26.0–34.0)
MCHC: 32.3 g/dL (ref 30.0–36.0)
MCV: 90.6 fL (ref 80.0–100.0)
Platelets: 126 10*3/uL — ABNORMAL LOW (ref 150–400)
RBC: 4.27 MIL/uL (ref 3.87–5.11)
RDW: 15.2 % (ref 11.5–15.5)
WBC: 7.9 10*3/uL (ref 4.0–10.5)
nRBC: 0 % (ref 0.0–0.2)

## 2023-08-12 LAB — COMPREHENSIVE METABOLIC PANEL
ALT: 11 U/L (ref 0–44)
AST: 15 U/L (ref 15–41)
Albumin: 2.8 g/dL — ABNORMAL LOW (ref 3.5–5.0)
Alkaline Phosphatase: 50 U/L (ref 38–126)
Anion gap: 7 (ref 5–15)
BUN: 8 mg/dL (ref 6–20)
CO2: 26 mmol/L (ref 22–32)
Calcium: 8.3 mg/dL — ABNORMAL LOW (ref 8.9–10.3)
Chloride: 102 mmol/L (ref 98–111)
Creatinine, Ser: 1.01 mg/dL — ABNORMAL HIGH (ref 0.44–1.00)
GFR, Estimated: >60 mL/min (ref >60)
Glucose, Bld: 115 mg/dL — ABNORMAL HIGH (ref 70–99)
Potassium: 3.6 mmol/L (ref 3.5–5.1)
Sodium: 135 mmol/L (ref 135–145)
Total Bilirubin: 0.6 mg/dL (ref <1.2)
Total Protein: 5.6 g/dL — ABNORMAL LOW (ref 6.5–8.1)

## 2023-08-12 LAB — SURGICAL PATHOLOGY

## 2023-08-12 MED ORDER — ALBUTEROL SULFATE (2.5 MG/3ML) 0.083% IN NEBU: 2.5000 mg | INHALATION_SOLUTION | RESPIRATORY_TRACT | Status: DC | PRN

## 2023-08-12 MED ORDER — POTASSIUM CHLORIDE CRYS ER 20 MEQ PO TBCR
40.0000 meq | EXTENDED_RELEASE_TABLET | Freq: Once | ORAL | Status: AC
  Administered 2023-08-12: 40 meq via ORAL
  Filled 2023-08-12: qty 2

## 2023-08-12 MED ORDER — KETOROLAC TROMETHAMINE 15 MG/ML IJ SOLN
15.0000 mg | Freq: Four times a day (QID) | INTRAMUSCULAR | Status: DC
  Administered 2023-08-12 – 2023-08-13 (×4): 15 mg via INTRAVENOUS
  Filled 2023-08-12 (×4): qty 1

## 2023-08-12 NOTE — Progress Notes (Signed)
Mobility Specialist Progress Note:   08/12/23 0900  Mobility  Activity Ambulated with assistance in hallway  Level of Assistance Contact guard assist, steadying assist  Assistive Device None  Distance Ambulated (ft) 120 ft  Activity Response Tolerated well  Mobility Referral Yes  $Mobility charge 1 Mobility  Mobility Specialist Start Time (ACUTE ONLY) 0915  Mobility Specialist Stop Time (ACUTE ONLY) 0925  Mobility Specialist Time Calculation (min) (ACUTE ONLY) 10 min    Pre Mobility: 73 HR During Mobility: 87 HR Post Mobility:  78 HR  Pt received on EOB, agreeable to mobility. Only requiring minG w/ no AD today. C/o continued L shoulder pain and some fatigue. Otherwise asymptomatic and returned to room w/o fault. Pt left on EOB with call bell and all needs met.  Shannon [name] Mobility Specialist Please contact via Special educational needs teacher or Rehab office at [phone]

## 2023-08-12 NOTE — Plan of Care (Signed)
Problem: Education: Goal: Knowledge of General Education information will improve Description: Including pain rating scale, medication(s)/side effects and non-pharmacologic comfort measures 08/12/2023 1917 by Anders Simmonds, RN Outcome: Progressing 08/12/2023 1916 by Anders Simmonds, RN Outcome: Progressing   Problem: Health Behavior/Discharge Planning: Goal: Ability to manage health-related needs will improve 08/12/2023 1917 by Anders Simmonds, RN Outcome: Progressing 08/12/2023 1916 by Anders Simmonds, RN Outcome: Progressing   Problem: Clinical Measurements: Goal: Ability to maintain clinical measurements within normal limits will improve 08/12/2023 1917 by Anders Simmonds, RN Outcome: Progressing 08/12/2023 1916 by Anders Simmonds, RN Outcome: Progressing Goal: Will remain free from infection 08/12/2023 1917 by Anders Simmonds, RN Outcome: Progressing 08/12/2023 1916 by Anders Simmonds, RN Outcome: Progressing Goal: Diagnostic test results will improve 08/12/2023 1917 by Anders Simmonds, RN Outcome: Progressing 08/12/2023 1916 by Anders Simmonds, RN Outcome: Progressing Goal: Respiratory complications will improve 08/12/2023 1917 by Anders Simmonds, RN Outcome: Progressing 08/12/2023 1916 by Anders Simmonds, RN Outcome: Progressing Goal: Cardiovascular complication will be avoided 08/12/2023 1917 by Anders Simmonds, RN Outcome: Progressing 08/12/2023 1916 by Anders Simmonds, RN Outcome: Progressing   Problem: Activity: Goal: Risk for activity intolerance will decrease 08/12/2023 1917 by Anders Simmonds, RN Outcome: Progressing 08/12/2023 1916 by Anders Simmonds, RN Outcome: Progressing   Problem: Nutrition: Goal: Adequate nutrition will be maintained 08/12/2023 1917 by Anders Simmonds, RN Outcome: Progressing 08/12/2023 1916 by Anders Simmonds, RN Outcome: Progressing   Problem: Coping: Goal: Level of anxiety will decrease 08/12/2023 1917 by Anders Simmonds, RN Outcome: Progressing 08/12/2023 1916 by Anders Simmonds, RN Outcome: Progressing   Problem: Elimination: Goal: Will not experience complications related to bowel motility 08/12/2023 1917 by Anders Simmonds, RN Outcome: Progressing 08/12/2023 1916 by Anders Simmonds, RN Outcome: Progressing Goal: Will not experience complications related to urinary retention 08/12/2023 1917 by Anders Simmonds, RN Outcome: Progressing 08/12/2023 1916 by Anders Simmonds, RN Outcome: Progressing   Problem: Pain Management: Goal: General experience of comfort will improve 08/12/2023 1917 by Anders Simmonds, RN Outcome: Progressing 08/12/2023 1916 by Anders Simmonds, RN Outcome: Progressing   Problem: Safety: Goal: Ability to remain free from injury will improve 08/12/2023 1917 by Anders Simmonds, RN Outcome: Progressing 08/12/2023 1916 by Anders Simmonds, RN Outcome: Progressing   Problem: Skin Integrity: Goal: Risk for impaired skin integrity will decrease 08/12/2023 1917 by Anders Simmonds, RN Outcome: Progressing 08/12/2023 1916 by Anders Simmonds, RN Outcome: Progressing   Problem: Education: Goal: Knowledge of disease or condition will improve 08/12/2023 1917 by Anders Simmonds, RN Outcome: Progressing 08/12/2023 1916 by Anders Simmonds, RN Outcome: Progressing Goal: Knowledge of the prescribed therapeutic regimen will improve 08/12/2023 1917 by Anders Simmonds, RN Outcome: Progressing 08/12/2023 1916 by Anders Simmonds, RN Outcome: Progressing   Problem: Activity: Goal: Risk for activity intolerance will decrease 08/12/2023 1917 by Anders Simmonds, RN Outcome: Progressing 08/12/2023 1916 by Anders Simmonds, RN Outcome: Progressing   Problem: Cardiac: Goal: Will achieve and/or maintain hemodynamic stability 08/12/2023 1917 by Anders Simmonds, RN Outcome: Progressing 08/12/2023 1916 by Anders Simmonds, RN Outcome: Progressing   Problem: Clinical  Measurements: Goal: Postoperative complications will be avoided or minimized 08/12/2023 1917 by Anders Simmonds, RN Outcome: Progressing 08/12/2023 1916 by Anders Simmonds, RN Outcome: Progressing   Problem: Respiratory: Goal: Respiratory status will improve 08/12/2023 1917 by Anders Simmonds, RN Outcome:  Progressing 08/12/2023 1916 by Anders Simmonds, RN Outcome: Progressing   Problem: Pain Management: Goal: Pain level will decrease 08/12/2023 1917 by Anders Simmonds, RN Outcome: Progressing 08/12/2023 1916 by Anders Simmonds, RN Outcome: Progressing   Problem: Skin Integrity: Goal: Wound healing without signs and symptoms infection will improve 08/12/2023 1917 by Anders Simmonds, RN Outcome: Progressing 08/12/2023 1916 by Anders Simmonds, RN Outcome: Progressing   Problem: Education: Goal: Knowledge of disease or condition will improve 08/12/2023 1917 by Anders Simmonds, RN Outcome: Progressing 08/12/2023 1916 by Anders Simmonds, RN Outcome: Progressing Goal: Knowledge of the prescribed therapeutic regimen will improve 08/12/2023 1917 by Anders Simmonds, RN Outcome: Progressing 08/12/2023 1916 by Anders Simmonds, RN Outcome: Progressing   Problem: Activity: Goal: Risk for activity intolerance will decrease 08/12/2023 1917 by Anders Simmonds, RN Outcome: Progressing 08/12/2023 1916 by Anders Simmonds, RN Outcome: Progressing   Problem: Cardiac: Goal: Will achieve and/or maintain hemodynamic stability 08/12/2023 1917 by Anders Simmonds, RN Outcome: Progressing 08/12/2023 1916 by Anders Simmonds, RN Outcome: Progressing   Problem: Clinical Measurements: Goal: Postoperative complications will be avoided or minimized 08/12/2023 1917 by Anders Simmonds, RN Outcome: Progressing 08/12/2023 1916 by Anders Simmonds, RN Outcome: Progressing   Problem: Respiratory: Goal: Respiratory status will improve 08/12/2023 1917 by Anders Simmonds, RN Outcome:  Progressing 08/12/2023 1916 by Anders Simmonds, RN Outcome: Progressing   Problem: Pain Management: Goal: Pain level will decrease 08/12/2023 1917 by Anders Simmonds, RN Outcome: Progressing 08/12/2023 1916 by Anders Simmonds, RN Outcome: Progressing   Problem: Skin Integrity: Goal: Wound healing without signs and symptoms infection will improve 08/12/2023 1917 by Anders Simmonds, RN Outcome: Progressing 08/12/2023 1916 by Anders Simmonds, RN Outcome: Progressing

## 2023-08-12 NOTE — Progress Notes (Addendum)
      [address]       [place] [address]             [phone]       2 Days Post-Op Procedure(s) (LRB): XI ROBOTIC ASSISTED THORACOSCOPY- LEFT UPPER LINGULAR SEGMENTECTOMY (Left) LYMPH NODE DISSECTION (Left) INTERCOSTAL NERVE BLOCK (Left)  Subjective: Patient with left clavicle pain/incision/chest tube pain this am (from chest tube/surgery). She did walk yesterday.  Objective: Vital signs in last 24 hours: Temp:  [97.7 F (36.5 C)-98.2 F (36.8 C)] 97.7 F (36.5 C) (11/13 0333) Pulse Rate:  [71-81] 72 (11/13 0333) Cardiac Rhythm: Normal sinus rhythm (11/12 0750) Resp:  [13-17] 17 (11/13 0333) BP: (98-133)/(68-85) 110/68 (11/13 0333) SpO2:  [94 %-99 %] 99 % (11/13 0333)     Intake/Output from previous day: 11/12 0701 - 11/13 0700 In: 1080 [P.O.:1080] Out: 281 [Urine:1; Chest Tube:280]   Physical Exam:  Cardiovascular: RRR Pulmonary: Clear to auscultation on the right;rub with chest tube on left Abdomen: Soft, non tender, bowel sounds present. Extremities: SCDs in place Wounds: Clean and dry.  No erythema or signs of infection. Chest Tube: to water seal, tidling with cough but no true air leak  Lab Results: CBC: Recent Labs    08/11/23 0244 08/12/23 0216  WBC 10.4 7.9  HGB 13.2 12.5  HCT 42.2 38.7  PLT 139* 126*   BMET:  Recent Labs    08/11/23 0244 08/12/23 0216  NA 134* 135  K 4.0 3.6  CL 101 102  CO2 23 26  GLUCOSE 117* 115*  BUN 8 8  CREATININE 0.96 1.01*  CALCIUM 8.4* 8.3*    PT/INR: No results for input(s): "LABPROT", "INR" in the last 72 hours. ABG:  INR: Will add last result for INR, ABG once components are confirmed Will add last 4 CBG results once components are confirmed  Assessment/Plan:  1. CV - SR. On Ziac as taken prior to surgery. 2.  Pulmonary - On room air. Chest tube with 280 cc last 24 hours. Chest tube to water seal, no air leak. CXR this am appears stable. Hope to remove chest tube. Encourage  incentive spirometer. Await final pathology. 3. Supplement potassium 4. On Lovenox for DVT prophylaxis 5. Mild thrombocytopenia-platelets decreased to 126,000 6. Regarding pain control, will add scheduled Toradol 6. If chest tube removed today, possible discharge in am  Donielle M ZimmermanPA-C 08/12/2023,7:15 AM  Patient seen and examined, agree with findings noted above Dc chest tube Continue ambulation Likely home tomorrow  Viviann Spare C. Dorris Fetch, MD [facility] [phone]

## 2023-08-12 NOTE — Plan of Care (Signed)
  Problem: Education: Goal: Knowledge of General Education information will improve Description: Including pain rating scale, medication(s)/side effects and non-pharmacologic comfort measures Outcome: Progressing   Problem: Health Behavior/Discharge Planning: Goal: Ability to manage health-related needs will improve Outcome: Progressing   Problem: Clinical Measurements: Goal: Ability to maintain clinical measurements within normal limits will improve Outcome: Progressing Goal: Will remain free from infection Outcome: Progressing Goal: Diagnostic test results will improve Outcome: Progressing Goal: Respiratory complications will improve Outcome: Progressing Goal: Cardiovascular complication will be avoided Outcome: Progressing   Problem: Activity: Goal: Risk for activity intolerance will decrease Outcome: Progressing   Problem: Nutrition: Goal: Adequate nutrition will be maintained Outcome: Progressing   Problem: Coping: Goal: Level of anxiety will decrease Outcome: Progressing   Problem: Elimination: Goal: Will not experience complications related to bowel motility Outcome: Progressing Goal: Will not experience complications related to urinary retention Outcome: Progressing   Problem: Pain Management: Goal: General experience of comfort will improve Outcome: Progressing   Problem: Safety: Goal: Ability to remain free from injury will improve Outcome: Progressing   Problem: Skin Integrity: Goal: Risk for impaired skin integrity will decrease Outcome: Progressing   Problem: Education: Goal: Knowledge of disease or condition will improve Outcome: Progressing Goal: Knowledge of the prescribed therapeutic regimen will improve Outcome: Progressing   Problem: Activity: Goal: Risk for activity intolerance will decrease Outcome: Progressing   Problem: Cardiac: Goal: Will achieve and/or maintain hemodynamic stability Outcome: Progressing   Problem: Clinical  Measurements: Goal: Postoperative complications will be avoided or minimized Outcome: Progressing   Problem: Respiratory: Goal: Respiratory status will improve Outcome: Progressing   Problem: Pain Management: Goal: Pain level will decrease Outcome: Progressing   Problem: Skin Integrity: Goal: Wound healing without signs and symptoms infection will improve Outcome: Progressing   Problem: Education: Goal: Knowledge of disease or condition will improve Outcome: Progressing Goal: Knowledge of the prescribed therapeutic regimen will improve Outcome: Progressing   Problem: Activity: Goal: Risk for activity intolerance will decrease Outcome: Progressing   Problem: Cardiac: Goal: Will achieve and/or maintain hemodynamic stability Outcome: Progressing   Problem: Clinical Measurements: Goal: Postoperative complications will be avoided or minimized Outcome: Progressing   Problem: Respiratory: Goal: Respiratory status will improve Outcome: Progressing   Problem: Pain Management: Goal: Pain level will decrease Outcome: Progressing   Problem: Skin Integrity: Goal: Wound healing without signs and symptoms infection will improve Outcome: Progressing

## 2023-08-12 NOTE — TOC CM/SW Note (Signed)
Transition of Care [facility]) - Inpatient Brief Assessment   Patient Details  Name: Shannon [name] MRN: [id number] Date of Birth: July 16, 1966  Transition of Care [facility]) CM/SW Contact:    Harriet Masson, RN Phone Number: 08/12/2023, 1:57 PM   Clinical Narrative: 2 Days Post-Op Procedure(s) (LRB): XI ROBOTIC ASSISTED THORACOSCOPY- LEFT UPPER LINGULAR SEGMENTECTOMY.  Chest tube discontinued today. Plan discharge tomorrow. No TOC needs at this time..   Transition of Care Asessment: Insurance and Status: Insurance coverage has been reviewed Patient has primary care physician: Yes Home environment has been reviewed: safe to discharge home when medically ready Prior level of function:: independent Prior/Current Home Services: No current home services Social Determinants of Health Reivew: SDOH reviewed no interventions necessary Readmission risk has been reviewed: Yes Transition of care needs: no transition of care needs at this time

## 2023-08-13 ENCOUNTER — Inpatient Hospital Stay (HOSPITAL_COMMUNITY): Payer: Medicare HMO

## 2023-08-13 DIAGNOSIS — C3492 Malignant neoplasm of unspecified part of left bronchus or lung: Principal | ICD-10-CM | POA: Insufficient documentation

## 2023-08-13 MED ORDER — GABAPENTIN 300 MG PO CAPS: 300.0000 mg | ORAL_CAPSULE | Freq: Every day | ORAL | 1 refills | Status: DC

## 2023-08-13 MED ORDER — OXYCODONE HCL 5 MG PO TABS: 5.0000 mg | ORAL_TABLET | Freq: Four times a day (QID) | ORAL | 0 refills | Status: AC | PRN

## 2023-08-13 NOTE — TOC Transition Note (Signed)
Transition of Care [facility]) - CM/SW Discharge Note   Patient Details  Name: Shannon [name] MRN: [id number] Date of Birth: 1966-01-06  Transition of Care [facility]) CM/SW Contact:  Harriet Masson, RN Phone Number: 08/13/2023, 10:57 AM   Clinical Narrative:    Patient stable for discharge.  No TOC needs at this time.    Final next level of care: Home/Self Care Barriers to Discharge: Barriers Resolved   Patient Goals and CMS Choice    Return home  Discharge Placement                 home        Discharge Plan and Services Additional resources added to the After Visit Summary for                                       Social Determinants of Health (SDOH) Interventions SDOH Screenings   Food Insecurity: No Food Insecurity (08/10/2023)  Housing: Low Risk  (08/10/2023)  Transportation Needs: No Transportation Needs (08/10/2023)  Utilities: Not At Risk (08/10/2023)  Depression (PHQ2-9): High Risk (10/16/2021)  Financial Resource Strain: High Risk (02/18/2023)   Received from [facility]  Social Connections: Unknown (01/28/2022)   Received from [facility]  Tobacco Use: High Risk (08/10/2023)     Readmission Risk Interventions    08/13/2023   10:57 AM  Readmission Risk Prevention Plan  Post Dischage Appt Complete  Medication Screening Complete  Transportation Screening Complete

## 2023-08-13 NOTE — Care Management Important Message (Signed)
Important Message  Patient Details  Name: Shannon [name] MRN: [id number] Date of Birth: 1966-05-20   Important Message Given:  Yes - Medicare IM  Patient left prior to IM delivery will mail a copy to the patient home address,   [name] 08/13/2023, 3:41 PM

## 2023-08-13 NOTE — Progress Notes (Signed)
Mobility Specialist Progress Note:   08/13/23 0900  Mobility  Activity Ambulated independently in hallway  Level of Assistance Standby assist, set-up cues, supervision of patient - no hands on  Assistive Device None  Distance Ambulated (ft) 340 ft  Activity Response Tolerated well  Mobility Referral Yes  $Mobility charge 1 Mobility  Mobility Specialist Start Time (ACUTE ONLY) 0911  Mobility Specialist Stop Time (ACUTE ONLY) [id number]  Mobility Specialist Time Calculation (min) (ACUTE ONLY) 10 min    Received pt in bed having no complaints and agreeable to mobility. Pt was asymptomatic throughout ambulation and returned to room w/o fault. Left on EOB w/ call bell in reach and all needs met.   Shannon [name] Mobility Specialist Please contact via Special educational needs teacher or Rehab office at [phone]

## 2023-08-13 NOTE — Plan of Care (Signed)

## 2023-08-13 NOTE — Progress Notes (Signed)
Patient verbalized understanding of dc instructions. All belongings   and dc papers given to patient.

## 2023-08-13 NOTE — Progress Notes (Addendum)
      [address]       [place] [address]             [phone]       3 Days Post-Op Procedure(s) (LRB): XI ROBOTIC ASSISTED THORACOSCOPY- LEFT UPPER LINGULAR SEGMENTECTOMY (Left) LYMPH NODE DISSECTION (Left) INTERCOSTAL NERVE BLOCK (Left)  Subjective: Patient states pain is better since chest tube removed. She did ambulate yesterday. She is eating breakfast this am without specific complaint.  Objective: Vital signs in last 24 hours: Temp:  [97.6 F (36.4 C)-98.5 F (36.9 C)] 98.5 F (36.9 C) (11/14 0320) Pulse Rate:  [60-76] 60 (11/14 0320) Cardiac Rhythm: Normal sinus rhythm;Bundle branch block (11/13 2000) Resp:  [14-18] 14 (11/14 0320) BP: (98-156)/(59-93) 98/59 (11/14 0320) SpO2:  [97 %-100 %] 97 % (11/14 0320)     Intake/Output from previous day: No intake/output data recorded.   Physical Exam:  Cardiovascular: RRR Pulmonary: Clear but diminished bilaterally Abdomen: Soft, non tender, bowel sounds present. Extremities: SCDs in place Wounds: Clean and dry.  No erythema or signs of infection. Chest tube wound with sero sanguinous drainage  Lab Results: CBC: Recent Labs    08/11/23 0244 08/12/23 0216  WBC 10.4 7.9  HGB 13.2 12.5  HCT 42.2 38.7  PLT 139* 126*   BMET:  Recent Labs    08/11/23 0244 08/12/23 0216  NA 134* 135  K 4.0 3.6  CL 101 102  CO2 23 26  GLUCOSE 117* 115*  BUN 8 8  CREATININE 0.96 1.01*  CALCIUM 8.4* 8.3*    PT/INR: No results for input(s): "LABPROT", "INR" in the last 72 hours. ABG:  INR: Will add last result for INR, ABG once components are confirmed Will add last 4 CBG results once components are confirmed  Assessment/Plan:  1. CV - SR. On Ziac as taken prior to surgery. 2.  Pulmonary - On room air. Chest tube removed yesterday. CXR this am appears stable.  Encourage incentive spirometer. Final pathology: TNM Code:pT1b, pN0 . Dr.Caelyn Route will discuss with patient. 4. On Lovenox for DVT  prophylaxis 5. Mild thrombocytopenia-Last platelets decreased to 126,000 6. Discharge   Donielle M ZimmermanPA-C 08/13/2023,6:54 AM  Patient seen and examined, agree with above Doing well, home today Path- stage IA (T1b,N0) adenocarcinoma  Viviann Spare C. Dorris Fetch, MD [facility] [phone]

## 2023-08-19 DIAGNOSIS — C3492 Malignant neoplasm of unspecified part of left bronchus or lung: Secondary | ICD-10-CM | POA: Diagnosis not present

## 2023-08-19 DIAGNOSIS — I119 Hypertensive heart disease without heart failure: Secondary | ICD-10-CM | POA: Diagnosis not present

## 2023-08-19 DIAGNOSIS — D696 Thrombocytopenia, unspecified: Secondary | ICD-10-CM | POA: Diagnosis not present

## 2023-08-19 DIAGNOSIS — E782 Mixed hyperlipidemia: Secondary | ICD-10-CM | POA: Diagnosis not present

## 2023-08-19 DIAGNOSIS — F17200 Nicotine dependence, unspecified, uncomplicated: Secondary | ICD-10-CM | POA: Diagnosis not present

## 2023-08-19 DIAGNOSIS — R1310 Dysphagia, unspecified: Secondary | ICD-10-CM | POA: Diagnosis not present

## 2023-08-19 DIAGNOSIS — F411 Generalized anxiety disorder: Secondary | ICD-10-CM | POA: Diagnosis not present

## 2023-08-19 DIAGNOSIS — F332 Major depressive disorder, recurrent severe without psychotic features: Secondary | ICD-10-CM | POA: Diagnosis not present

## 2023-08-21 ENCOUNTER — Other Ambulatory Visit: Payer: Self-pay

## 2023-08-21 NOTE — Progress Notes (Signed)
The proposed treatment discussed in conference is for discussion purpose only and is not a binding recommendation.  The patients have not been physically examined, or presented with their treatment options.  Therefore, final treatment plans cannot be decided.  

## 2023-08-24 ENCOUNTER — Other Ambulatory Visit: Payer: Self-pay | Admitting: Thoracic Surgery (Cardiothoracic Vascular Surgery)

## 2023-08-24 DIAGNOSIS — R918 Other nonspecific abnormal finding of lung field: Secondary | ICD-10-CM

## 2023-08-25 ENCOUNTER — Ambulatory Visit: Payer: Self-pay | Admitting: Thoracic Surgery (Cardiothoracic Vascular Surgery)

## 2023-08-31 ENCOUNTER — Ambulatory Visit
Admission: RE | Admit: 2023-08-31 | Discharge: 2023-08-31 | Disposition: A | Discharge status: Home / Self Care | Payer: Medicare HMO | Source: Ambulatory Visit | Attending: Thoracic Surgery (Cardiothoracic Vascular Surgery) | Admitting: Thoracic Surgery (Cardiothoracic Vascular Surgery)

## 2023-08-31 ENCOUNTER — Other Ambulatory Visit: Payer: Self-pay

## 2023-08-31 ENCOUNTER — Ambulatory Visit: Payer: Medicare HMO

## 2023-08-31 VITALS — BP 104/71 | HR 65 | Resp 20

## 2023-08-31 DIAGNOSIS — R0689 Other abnormalities of breathing: Secondary | ICD-10-CM

## 2023-08-31 DIAGNOSIS — Z4802 Encounter for removal of sutures: Secondary | ICD-10-CM

## 2023-08-31 DIAGNOSIS — R0602 Shortness of breath: Secondary | ICD-10-CM | POA: Diagnosis not present

## 2023-08-31 DIAGNOSIS — Z902 Acquired absence of lung [part of]: Secondary | ICD-10-CM | POA: Diagnosis not present

## 2023-08-31 NOTE — Progress Notes (Signed)
Removed 1 suture from chest tube site. No signs of infection and patient tolerated well/ She was c/o runny nose/ sinus drainage and shortness of breath with movement. Sent her for CXR today. Will send to MD when completed. Recommended antihistamine and mucinex daily

## 2023-09-02 NOTE — Progress Notes (Unsigned)
Cardiology Office Note    Date:  09/02/2023  ID:  [name], Shannon 1966/05/03, MRN [id number] Cardiologist: Marjo Bicker, MD    History of Present Illness:    Shannon [name] is a 57 y.o. female with past medical history of CAD (s/p DES to LCx in 2014, cath in 07/2018 showing patent stent with mild to moderate nonobstructive disease involving the mid-LAD and RCA, low-risk NST in 11/2020), carotid artery stenosis (known LICA occlusion), HTN, HLD and tobacco use who presents to the office today for follow-up.  She was examined by Dr. Jenene Slicker in 06/2023 and had recent been diagnosed with lung cancer and was scheduled for robotic assisted thoracoscopy with left upper lingular segmentectomy.  She has stable dyspnea on exertion given the timeframe since last ischemic evaluation, a Lexiscan Myoview was recommended and this showed no evidence of ischemia and was a low risk study.  She was admitted to [facility] from 11/11 - 08/13/2023 for her procedure and no immediate complications were noted.  It appears that no changes were made to her medications at the time of discharge.  ROS: ***  Studies Reviewed:   EKG: EKG is*** ordered today and demonstrates ***   EKG Interpretation Date/Time:    Ventricular Rate:    PR Interval:    QRS Duration:    QT Interval:    QTC Calculation:   R Axis:      Text Interpretation:         Echocardiogram: 07/2021 IMPRESSIONS     1. Left ventricular ejection fraction, by estimation, is 60 to 65%. The  left ventricle has normal function. The left ventricle has no regional  wall motion abnormalities. Left ventricular diastolic parameters were  normal.   2. Right ventricular systolic function is normal. The right ventricular  size is normal.   3. The mitral valve is normal in structure. No evidence of mitral valve  regurgitation. No evidence of mitral stenosis.   4. The aortic valve was not well visualized. Aortic valve regurgitation  is not  visualized. No aortic stenosis is present.   5. The inferior vena cava is normal in size with greater than 50%  respiratory variability, suggesting right atrial pressure of 3 mmHg.    NST: 06/2023   Stress ECG is negative for ischemia and arrhythmias.   LV perfusion is normal. There is no evidence of ischemia. There is no evidence of infarction.   Left ventricular function is normal. Nuclear stress EF: 75%.   Findings are consistent with no ischemia and no infarction. The study is low risk.   Risk Assessment/Calculations:   {Does this patient have ATRIAL FIBRILLATION?[phone]} No BP recorded.  {Refresh Note OR Click here to enter BP  :1}***         Physical Exam:   VS:  LMP 07/01/2015    Wt Readings from Last 3 Encounters:  08/10/23 240 lb (108.9 kg)  08/06/23 248 lb 8 oz (112.7 kg)  07/23/23 251 lb 6.4 oz (114 kg)     GEN: Well nourished, well developed in no acute distress NECK: No JVD; No carotid bruits CARDIAC: ***RRR, no murmurs, rubs, gallops RESPIRATORY:  Clear to auscultation without rales, wheezing or rhonchi  ABDOMEN: Appears non-distended. No obvious abdominal masses. EXTREMITIES: No clubbing or cyanosis. No edema.  Distal pedal pulses are 2+ bilaterally.   Assessment and Plan:      {Are you ordering a CV Procedure (e.g. stress test, cath, DCCV, TEE, etc)?  Press F2        :[id number]}   Signed, Ellsworth Lennox, PA-C

## 2023-09-03 ENCOUNTER — Ambulatory Visit: Payer: Medicare HMO | Admitting: Student

## 2023-09-18 ENCOUNTER — Telehealth: Payer: Self-pay

## 2023-09-18 NOTE — Telephone Encounter (Signed)
-----   Message from Loreli Slot sent at 09/17/2023  9:22 PM EST ----- Regarding: RE: Chest xray ok ----- Message ----- From: Steve Rattler, RN Sent: 09/17/2023   4:49 PM EST To: Loreli Slot, MD Subject: Chest xray                                     Hey,  She is refusing to have another chest xray done due to the cost. She is to follow-up with you on Monday, 12/23. She did have an xray at her suture removal appointments x 2 weeks ago. Advised that you may have her get one once she leaves the appointment here. Just FYI.  Morrie Sheldon

## 2023-09-21 ENCOUNTER — Ambulatory Visit: Payer: Self-pay | Admitting: Thoracic Surgery (Cardiothoracic Vascular Surgery)

## 2023-09-24 DIAGNOSIS — Z0100 Encounter for examination of eyes and vision without abnormal findings: Secondary | ICD-10-CM | POA: Diagnosis not present

## 2023-09-24 DIAGNOSIS — H04123 Dry eye syndrome of bilateral lacrimal glands: Secondary | ICD-10-CM | POA: Diagnosis not present

## 2023-09-24 DIAGNOSIS — H524 Presbyopia: Secondary | ICD-10-CM | POA: Diagnosis not present

## 2023-10-06 DIAGNOSIS — M4804 Spinal stenosis, thoracic region: Secondary | ICD-10-CM | POA: Diagnosis not present

## 2023-10-06 DIAGNOSIS — M5451 Vertebrogenic low back pain: Secondary | ICD-10-CM | POA: Diagnosis not present

## 2023-10-08 ENCOUNTER — Other Ambulatory Visit: Payer: Self-pay | Admitting: Physician Assistant

## 2023-10-08 DIAGNOSIS — M4804 Spinal stenosis, thoracic region: Secondary | ICD-10-CM

## 2023-10-12 ENCOUNTER — Other Ambulatory Visit: Payer: Self-pay | Admitting: Thoracic Surgery (Cardiothoracic Vascular Surgery)

## 2023-10-12 DIAGNOSIS — R918 Other nonspecific abnormal finding of lung field: Secondary | ICD-10-CM

## 2023-10-13 ENCOUNTER — Encounter: Payer: Self-pay | Admitting: Thoracic Surgery (Cardiothoracic Vascular Surgery)

## 2023-10-13 ENCOUNTER — Ambulatory Visit (INDEPENDENT_AMBULATORY_CARE_PROVIDER_SITE_OTHER): Payer: Self-pay | Admitting: Thoracic Surgery (Cardiothoracic Vascular Surgery)

## 2023-10-13 VITALS — BP 144/85 | HR 71 | Resp 18 | Ht 65.0 in | Wt 244.0 lb

## 2023-10-13 DIAGNOSIS — Z902 Acquired absence of lung [part of]: Secondary | ICD-10-CM

## 2023-10-13 NOTE — Progress Notes (Signed)
 [address]       [place]annon [name] [address]             [phone]      HPI: Shannon [name] returns for a scheduled follow-up visit after lingular segmentectomy.  Shannon [name] is a 58 year old woman with a past medical history significant for tobacco use, atherosclerotic cardiovascular disease, coronary disease, fibromyalgia, chronic back pain, reflux, arthritis, anxiety and depression, obesity, and sleep apnea.  She was found to have a nodule in the lingula a couple of years ago.  It was monitored.  There was little change initially but in August 2024 she had a CT angiogram which showed the nodule had increased in size.  She ruled out for MI and pulmonary embolus.  She saw Dr. Sherrod.  A PET/CT showed the nodule was markedly hypermetabolic.  Clinical stage was 1A.  She underwent robotic assisted lingular segmentectomy on 08/10/2023.  Postoperative course was uncomplicated and she went home on day 3.  Pathology showed a T1b, N0, stage Ia adenocarcinoma.  She is not really having any incisional pain.  She does have problems with chronic back pain.  No respiratory issues.  Still smoking although she has cut back to about 1/2 pack/day.  Past Medical History:  Diagnosis Date   Anxiety    Arthritis    oa needs hip replacement on right   Asthma    allergies    Bronchitis    Cancer (HCC)    squamous cell areas removed from vulva and pre caner areas removed from leg    Carotid artery occlusion    left   Complication of anesthesia    major depression after general anesthesia   Coronary artery disease    LHC 11/25/12 90% stenosis mid LCx & otherwise nonobstructive dz w/ EF 60-65% S/p PTCA/DES to LCx   Depression    Dyspnea    with exertion    Elevated cholesterol    Exertional dyspnea    chronic   Fibromyalgia    GERD (gastroesophageal reflux disease)    History of cardiovascular stress test 06/2015   low risk   History of hiatal hernia    HPV in female     Hyperlipidemia    Hypertension    Liver abscess 09/09/2021   MI (myocardial infarction) (HCC) fe 17, 2014   Obesity    Pneumonia    hx of x 2 years ago    Polycystic ovary disease    Skin tear of upper arm without complication, left, sequela    small skin tear left upper arm no drainage for 1 week   Sleep apnea    mild no cpap    Tobacco abuse     Current Outpatient Medications  Medication Sig Dispense Refill   acetaminophen  (TYLENOL ) 500 MG tablet Take 500 mg by mouth every 6 (six) hours as needed for moderate pain (pain score 4-6).     albuterol  (PROVENTIL  HFA;VENTOLIN  HFA) 108 (90 Base) MCG/ACT inhaler Inhale 2 puffs into the lungs every 4 (four) hours as needed for wheezing or shortness of breath. 1 Inhaler 0   ALPRAZolam  (XANAX ) 1 MG tablet Take 1 mg by mouth 2 (two) times daily as needed for anxiety.  2   aspirin  EC 81 MG tablet Take 1 tablet (81 mg total) by mouth daily. Swallow whole. (Patient taking differently: Take 81 mg by mouth at bedtime. Swallow whole.) 30 tablet 11   bismuth subsalicylate (PEPTO BISMOL) 262 MG  chewable tablet Chew 524 mg by mouth as needed for diarrhea or loose stools.     bisoprolol -hydrochlorothiazide  (ZIAC ) 5-6.25 MG tablet Take 1 tablet by mouth at bedtime.     buPROPion  (WELLBUTRIN  XL) 150 MG 24 hr tablet Take 150 mg by mouth daily.     escitalopram  (LEXAPRO ) 20 MG tablet Take 20 mg by mouth at bedtime.     fluticasone  (FLONASE ) 50 MCG/ACT nasal spray Place 2 sprays into both nostrils at bedtime.     nitroGLYCERIN  (NITROSTAT ) 0.4 MG SL tablet Place 0.4 mg under the tongue every 5 (five) minutes as needed for chest pain.     oxyCODONE  (OXY IR/ROXICODONE ) 5 MG immediate release tablet Take 1 tablet (5 mg total) by mouth every 6 (six) hours as needed for severe pain (pain score 7-10). 30 tablet 0   pantoprazole  (PROTONIX ) 40 MG tablet Take 40 mg by mouth at bedtime.     rosuvastatin  (CRESTOR ) 10 MG tablet Take 10 mg by mouth at bedtime.      triamcinolone  cream (KENALOG ) 0.5 % Apply 1 Application topically 2 (two) times daily as needed (rash).     furosemide  (LASIX ) 20 MG tablet Take 1 tablet (20 mg total) by mouth daily as needed. 90 tablet 3   No current facility-administered medications for this visit.    Physical Exam BP (!) 144/85 (BP Location: Right Arm)   Pulse 71   Resp 18   Ht 5' 5 (1.651 m)   Wt 244 lb (110.7 kg)   LMP 07/01/2015   SpO2 97% Comment: ra  BMI 40.62 kg/m  58 year old woman in no acute distress Alert and oriented x 3 with no focal deficits Lungs clear with equal breath sounds bilaterally Incisions well-healed Cardiac regular rate and rhythm  Diagnostic Tests: None  Impression: Shannon [name] is a 58 year old woman with a past medical history significant for tobacco use, atherosclerotic cardiovascular disease, coronary disease, fibromyalgia, chronic back pain, reflux, arthritis, anxiety and depression, obesity, and sleep apnea.  Stage Ia adenocarcinoma-status post lingular segmentectomy.  Does not need any adjuvant treatment.  She does need to follow-up with Dr. Sherrod and get scheduled for continued follow-up.  Tolerating segmentectomy well.  No restrictions on her activities from my standpoint.  Tobacco use-has cut back significantly.  Emphasized the importance of complete abstinence.  She seems motivated to try.  Plan: Follow-up with Dr. Sherrod Return in roughly 6 months after CT scan with Dr. Sherrod [name], MD [facility] [phone]

## 2023-10-20 ENCOUNTER — Encounter: Payer: Self-pay | Admitting: Physician Assistant

## 2023-10-22 NOTE — Progress Notes (Unsigned)
[facility] OFFICE PROGRESS NOTE  [name], PA-C [address] [place] [address]  DIAGNOSIS: Stage Ia (T1b, N0, M0) non-small cell lung cancer, adenocarcinoma She presented with a 2.0 cm lingular nodule. She was diagnosed in August 2024.   PRIOR THERAPY: robotic assisted left upper lobe lingular segmentectomy under the care of Dr. Dorris Fetch on 08/10/23.   CURRENT THERAPY: Observation   INTERVAL HISTORY: Shannon [name] 58 y.o. female returns to the clinic today for a follow-up visit. The patient establish care in the clinic a few months ago.  She was found to have a lingular nodule without any signs of metastatic disease or nodal metastasis.  She saw Dr. Dorris Fetch who arranged for a robot-assisted left upper lobe lingular segmentectomy on 08/10/2023.  The final stage was pT1b, PN 0 stage Ia adenocarcinoma.  Overall she tolerated surgery well She does have some residual postoperative pain.  She does not like taking medications and is not taking gabapentin.  She is trying to cut back on smoking and she cut back from 2 to 2-1/2 pack cigarettes per day to half a pack of cigarettes per day.  Today she denies any fever, chills, night sweats, or unexplained weight loss.  He did have some shortness of breath following surgery but states it is much better than it was.  She also states her cough is much better than it was.  She denies any hemoptysis. Denies any nausea, vomiting, diarrhea, or constipation.  Denies any headache or visual changes.  She is here today for evaluation and discussed next steps in her care.    MEDICAL HISTORY: Past Medical History:  Diagnosis Date   Anxiety    Arthritis    oa needs hip replacement on right   Asthma    allergies    Bronchitis    Cancer (HCC)    squamous cell areas removed from vulva and pre caner areas removed from leg    Carotid artery occlusion    left   Complication of anesthesia    major depression after  general anesthesia   Coronary artery disease    LHC 11/25/12 90% stenosis mid LCx & otherwise nonobstructive dz w/ EF 60-65% S/p PTCA/DES to LCx   Depression    Dyspnea    with exertion    Elevated cholesterol    Exertional dyspnea    chronic   Fibromyalgia    GERD (gastroesophageal reflux disease)    History of cardiovascular stress test 06/2015   low risk   History of hiatal hernia    HPV in female    Hyperlipidemia    Hypertension    Liver abscess 09/09/2021   MI (myocardial infarction) (HCC) fe 17, 2014   Obesity    Pneumonia    hx of x 2 years ago    Polycystic ovary disease    Skin tear of upper arm without complication, left, sequela    small skin tear left upper arm no drainage for 1 week   Sleep apnea    mild no cpap    Tobacco abuse     ALLERGIES:  is allergic to clomiphene citrate, doxycycline, morphine and codeine, other, sulfa antibiotics, and clomiphene.  MEDICATIONS:  Current Outpatient Medications  Medication Sig Dispense Refill   acetaminophen (TYLENOL) 500 MG tablet Take 500 mg by mouth every 6 (six) hours as needed for moderate pain (pain score 4-6).     albuterol (PROVENTIL HFA;VENTOLIN HFA) 108 (90 Base) MCG/ACT  inhaler Inhale 2 puffs into the lungs every 4 (four) hours as needed for wheezing or shortness of breath. 1 Inhaler 0   ALPRAZolam (XANAX) 1 MG tablet Take 1 mg by mouth 2 (two) times daily as needed for anxiety.  2   aspirin EC 81 MG tablet Take 1 tablet (81 mg total) by mouth daily. Swallow whole. (Patient taking differently: Take 81 mg by mouth at bedtime. Swallow whole.) 30 tablet 11   bismuth subsalicylate (PEPTO BISMOL) 262 MG chewable tablet Chew 524 mg by mouth as needed for diarrhea or loose stools.     bisoprolol-hydrochlorothiazide (ZIAC) 5-6.25 MG tablet Take 1 tablet by mouth at bedtime.     buPROPion (WELLBUTRIN XL) 150 MG 24 hr tablet Take 150 mg by mouth daily.     escitalopram (LEXAPRO) 20 MG tablet Take 20 mg by mouth at bedtime.      fluticasone (FLONASE) 50 MCG/ACT nasal spray Place 2 sprays into both nostrils at bedtime.     furosemide (LASIX) 20 MG tablet Take 1 tablet (20 mg total) by mouth daily as needed. 90 tablet 3   nitroGLYCERIN (NITROSTAT) 0.4 MG SL tablet Place 0.4 mg under the tongue every 5 (five) minutes as needed for chest pain.     oxyCODONE (OXY IR/ROXICODONE) 5 MG immediate release tablet Take 1 tablet (5 mg total) by mouth every 6 (six) hours as needed for severe pain (pain score 7-10). 30 tablet 0   pantoprazole (PROTONIX) 40 MG tablet Take 40 mg by mouth at bedtime.     rosuvastatin (CRESTOR) 10 MG tablet Take 10 mg by mouth at bedtime.     triamcinolone cream (KENALOG) 0.5 % Apply 1 Application topically 2 (two) times daily as needed (rash).     No current facility-administered medications for this visit.    SURGICAL HISTORY:  Past Surgical History:  Procedure Laterality Date   CORONARY ANGIOPLASTY WITH STENT PLACEMENT     INTERCOSTAL NERVE BLOCK Left 08/10/2023   Procedure: INTERCOSTAL NERVE BLOCK;  Surgeon: Loreli Slot, MD;  Location: [facility] OR;  Service: Thoracic;  Laterality: Left;   LEFT HEART CATH AND CORONARY ANGIOGRAPHY N/A 08/09/2018   Procedure: LEFT HEART CATH AND CORONARY ANGIOGRAPHY;  Surgeon: Yvonne Kendall, MD;  Location: [facility] INVASIVE CV LAB;  Service: Cardiovascular;  Laterality: N/A;   LEFT HEART CATHETERIZATION WITH CORONARY ANGIOGRAM N/A 11/15/2012   Procedure: LEFT HEART CATHETERIZATION WITH CORONARY ANGIOGRAM;  Surgeon: Vesta Mixer, MD;  Location: [facility] CATH LAB;  Service: Cardiovascular;  Laterality: N/A;   LYMPH NODE DISSECTION Left 08/10/2023   Procedure: LYMPH NODE DISSECTION;  Surgeon: Loreli Slot, MD;  Location: [facility] OR;  Service: Thoracic;  Laterality: Left;   PERCUTANEOUS CORONARY STENT INTERVENTION (PCI-S)  11/15/2012   Procedure: PERCUTANEOUS CORONARY STENT INTERVENTION (PCI-S);  Surgeon: Vesta Mixer, MD;  Location: [facility] CATH LAB;  Service:  Cardiovascular;;   skin cancer removed right lower leg Right    also removed from left leg and chest area   TONSILLECTOMY     TOTAL HIP ARTHROPLASTY Right 01/16/2021   Procedure: TOTAL HIP ARTHROPLASTY ANTERIOR APPROACH;  Surgeon: Ollen Gross, MD;  Location: [facility] ORS;  Service: Orthopedics;  Laterality: Right;    uterus ablation     VULVECTOMY N/A 11/08/2019   Procedure: excision of VULVA, vulvoscopy;  Surgeon: Marcelle Overlie, MD;  Location: [facility];  Service: Gynecology;  Laterality: N/A;   XI ROBOTIC ASSISTED THORACOSCOPY- SEGMENTECTOMY Left 08/10/2023   Procedure: XI ROBOTIC  ASSISTED THORACOSCOPY- LEFT UPPER LINGULAR SEGMENTECTOMY;  Surgeon: Loreli Slot, MD;  Location: [facility] OR;  Service: Thoracic;  Laterality: Left;    REVIEW OF SYSTEMS:   Review of Systems  Constitutional: Negative for appetite change, chills, fatigue, fever and unexpected weight change.  HENT: Negative for mouth sores, nosebleeds, sore throat and trouble swallowing.   Eyes: Negative for eye problems and icterus.  Respiratory: Positive for improving shortness of breath and cough.  Negative for hemoptysis and wheezing.   Cardiovascular: Positive for rib pain. Negative for leg swelling.  Gastrointestinal: Negative for abdominal pain, constipation, diarrhea, nausea and vomiting.  Genitourinary: Negative for bladder incontinence, difficulty urinating, dysuria, frequency and hematuria.   Musculoskeletal: Negative for back pain, gait problem, neck pain and neck stiffness.  Skin: Negative for itching and rash.  Neurological: Negative for dizziness, extremity weakness, gait problem, headaches, light-headedness and seizures.  Hematological: Negative for adenopathy. Does not bruise/bleed easily.  Psychiatric/Behavioral: Negative for confusion, depression and sleep disturbance. The patient is not nervous/anxious.     PHYSICAL EXAMINATION:  Last menstrual period 07/01/2015.  ECOG  PERFORMANCE STATUS: 1  Physical Exam  Constitutional: Oriented to person, place, and time and well-developed, well-nourished, and in no distress.  HENT:  Head: Normocephalic and atraumatic.  Mouth/Throat: Oropharynx is clear and moist. No oropharyngeal exudate.  Eyes: Conjunctivae are normal. Right eye exhibits no discharge. Left eye exhibits no discharge. No scleral icterus.  Neck: Normal range of motion. Neck supple.  Cardiovascular: Normal rate, regular rhythm, normal heart sounds and intact distal pulses.   Pulmonary/Chest: Effort normal and breath sounds normal. No respiratory distress. No wheezes. No rales.  Abdominal: Soft. Bowel sounds are normal. Exhibits no distension and no mass. There is no tenderness.  Musculoskeletal: Normal range of motion. Exhibits no edema.  Lymphadenopathy:    No cervical adenopathy.  Neurological: Alert and oriented to person, place, and time. Exhibits normal muscle tone. Gait normal. Coordination normal.  Skin: Skin is warm and dry. No rash noted. Not diaphoretic. No erythema. No pallor.  Psychiatric: Mood, memory and judgment normal.  Vitals reviewed.  LABORATORY DATA: Lab Results  Component Value Date   WBC 7.9 08/12/2023   HGB 12.5 08/12/2023   HCT 38.7 08/12/2023   MCV 90.6 08/12/2023   PLT 126 (L) 08/12/2023      Chemistry      Component Value Date/Time   NA 135 08/12/2023 0216   K 3.6 08/12/2023 0216   CL 102 08/12/2023 0216   CO2 26 08/12/2023 0216   BUN 8 08/12/2023 0216   CREATININE 1.01 (H) 08/12/2023 0216   CREATININE 0.88 06/10/2023 1404   CREATININE 0.89 10/16/2021 1122      Component Value Date/Time   CALCIUM 8.3 (L) 08/12/2023 0216   ALKPHOS 50 08/12/2023 0216   AST 15 08/12/2023 0216   AST 14 (L) 06/10/2023 1404   ALT 11 08/12/2023 0216   ALT 18 06/10/2023 1404   BILITOT 0.6 08/12/2023 0216   BILITOT 0.4 06/10/2023 1404       RADIOGRAPHIC STUDIES:  No results found.   ASSESSMENT/PLAN:  This is a very  pleasant 58 year old Caucasian female with stage Ia (T1b, N0, M0) non-small cell lung cancer, adenocarcinoma.  She presented with a lingular nodule.  The patient underwent robotic assisted left upper lobe lingular  segmentectomy under the care of Dr. Dorris Fetch on 08/10/2023.  The patient was seen with Dr. Arbutus Ped. Dr. Arbutus Ped discussed there is no role for adjuvant treatment.  Dr. Arbutus Ped discussed  the 5-year survival with stage I is 80%.   Recommend she continue on observation with repeat CT scan of the chest in 6 months.   We will see her back 1 week after her CT to review the results.   The patient I discussed smoking cessation.  She is working on cutting back.  She understands the importance of smoking cessation and is hopeful to permanently quit smoking the next few weeks .   The patient was advised to call immediately if she has any concerning symptoms in the interval. The patient voices understanding of current disease status and treatment options and is in agreement with the current care plan. All questions were answered. The patient knows to call the clinic with any problems, questions or concerns. We can certainly see the patient much sooner if necessary   No orders of the defined types were placed in this encounter.    Kecia Swoboda L Edee Nifong, PA-C 10/22/23  ADDENDUM: Hematology/Oncology Attending: I had a face-to-face encounter with the patient today.  I reviewed her records, lab, recent pathology report and recommended her care plan.  This is a very pleasant 58 years old white female diagnosed with a stage Ia non-small cell lung cancer, adenocarcinoma in August 2024.  The patient underwent left upper lobe lingular segmentectomy under the care of Dr. Dorris Fetch on August 10, 2023.  She is recovering well from her surgery with no concerning complaints except for mild pain in the left side of the chest. Her tumor size was 2.0 cm. I had a lengthy discussion with the patient  today about her current condition and future monitoring of her condition. I explained to the patient that there is no survival benefit for adjuvant systemic chemotherapy, radiation, immunotherapy or targeted therapy for patient with stage Ia non-small cell lung cancer at this time. I recommended for the patient to continue on observation with repeat CT scan of the chest in 6 months.  I also explained to the patient that the 5-year survival for the patient with a stage Ia is around 80%. The patient will come back for follow-up visit in 6 months for evaluation with repeat CT scan of the chest. She was advised to call immediately if she has any other concerning symptoms in the interval. The total time spent in the appointment was 30 minutes. Disclaimer: This note was dictated with voice recognition software. Similar sounding words can inadvertently be transcribed and may be missed upon review.  Lajuana Matte, MD

## 2023-10-23 ENCOUNTER — Other Ambulatory Visit: Payer: Medicare HMO

## 2023-10-26 ENCOUNTER — Inpatient Hospital Stay: Payer: Medicare Other | Attending: Physician Assistant | Admitting: Physician Assistant

## 2023-10-26 ENCOUNTER — Other Ambulatory Visit: Payer: Self-pay

## 2023-10-26 VITALS — BP 141/79 | HR 67 | Temp 97.2°F | Resp 13 | Wt 243.4 lb

## 2023-10-26 DIAGNOSIS — F17210 Nicotine dependence, cigarettes, uncomplicated: Secondary | ICD-10-CM | POA: Insufficient documentation

## 2023-10-26 DIAGNOSIS — C3490 Malignant neoplasm of unspecified part of unspecified bronchus or lung: Secondary | ICD-10-CM | POA: Diagnosis not present

## 2023-10-26 DIAGNOSIS — C3412 Malignant neoplasm of upper lobe, left bronchus or lung: Secondary | ICD-10-CM | POA: Insufficient documentation

## 2023-11-03 DIAGNOSIS — L821 Other seborrheic keratosis: Secondary | ICD-10-CM | POA: Diagnosis not present

## 2023-11-03 DIAGNOSIS — L538 Other specified erythematous conditions: Secondary | ICD-10-CM | POA: Diagnosis not present

## 2023-11-03 DIAGNOSIS — L2989 Other pruritus: Secondary | ICD-10-CM | POA: Diagnosis not present

## 2023-11-03 DIAGNOSIS — C44629 Squamous cell carcinoma of skin of left upper limb, including shoulder: Secondary | ICD-10-CM | POA: Diagnosis not present

## 2023-11-03 DIAGNOSIS — L309 Dermatitis, unspecified: Secondary | ICD-10-CM | POA: Diagnosis not present

## 2023-11-03 DIAGNOSIS — L820 Inflamed seborrheic keratosis: Secondary | ICD-10-CM | POA: Diagnosis not present

## 2023-11-03 DIAGNOSIS — D485 Neoplasm of uncertain behavior of skin: Secondary | ICD-10-CM | POA: Diagnosis not present

## 2023-11-04 ENCOUNTER — Ambulatory Visit
Admission: RE | Admit: 2023-11-04 | Discharge: 2023-11-04 | Disposition: A | Discharge status: Home / Self Care | Payer: Medicare Other | Source: Ambulatory Visit | Attending: Physician Assistant | Admitting: Physician Assistant

## 2023-11-04 DIAGNOSIS — M5134 Other intervertebral disc degeneration, thoracic region: Secondary | ICD-10-CM | POA: Diagnosis not present

## 2023-11-04 DIAGNOSIS — M4804 Spinal stenosis, thoracic region: Secondary | ICD-10-CM | POA: Diagnosis not present

## 2023-11-26 DIAGNOSIS — M47814 Spondylosis without myelopathy or radiculopathy, thoracic region: Secondary | ICD-10-CM | POA: Diagnosis not present

## 2023-11-26 DIAGNOSIS — M5451 Vertebrogenic low back pain: Secondary | ICD-10-CM | POA: Diagnosis not present

## 2023-12-28 DIAGNOSIS — C44629 Squamous cell carcinoma of skin of left upper limb, including shoulder: Secondary | ICD-10-CM | POA: Diagnosis not present

## 2024-01-06 DIAGNOSIS — H6123 Impacted cerumen, bilateral: Secondary | ICD-10-CM | POA: Diagnosis not present

## 2024-01-06 DIAGNOSIS — F332 Major depressive disorder, recurrent severe without psychotic features: Secondary | ICD-10-CM | POA: Diagnosis not present

## 2024-01-06 DIAGNOSIS — J069 Acute upper respiratory infection, unspecified: Secondary | ICD-10-CM | POA: Diagnosis not present

## 2024-01-06 DIAGNOSIS — G894 Chronic pain syndrome: Secondary | ICD-10-CM | POA: Diagnosis not present

## 2024-03-30 DIAGNOSIS — L255 Unspecified contact dermatitis due to plants, except food: Secondary | ICD-10-CM | POA: Diagnosis not present

## 2024-04-18 ENCOUNTER — Telehealth: Payer: Self-pay | Admitting: Internal Medicine

## 2024-04-18 NOTE — Telephone Encounter (Signed)
 Rescheduled appointments per incoming call. Talked with the patient and she is aware of the changes made to her upcoming appointment.

## 2024-04-25 ENCOUNTER — Telehealth: Payer: Self-pay

## 2024-04-25 ENCOUNTER — Telehealth: Payer: Self-pay | Admitting: Internal Medicine

## 2024-04-25 ENCOUNTER — Other Ambulatory Visit: Payer: Medicare Other

## 2024-04-25 NOTE — Telephone Encounter (Signed)
 Canceled and rescheduled appointments per the patient wanting her CT scan to be preformed at [facility]. In formed the patient of the labs being moved to later in the day (Per her request) and of the canceled MD visit. I asked that the patient call us  back once the scan is rescheduled.

## 2024-04-25 NOTE — Telephone Encounter (Signed)
 Spoke with patient regarding scheduled CT scan. Patient stated that the co-pay is cheaper if the scan is performed at [facility].  Message sent to [facility] schedulers, and scan was scheduled for tomorrow at 1:00 PM. Per schedulers, the only labs they will draw are creatinine and BUN. To prevent the patient from having to visit two separate locations in one day, offered to schedule 130 labs and a 2:00 PM visit with Dr. Sherrod to review scan on 05/04/24. Patient voiced understanding and expressed thanks.

## 2024-04-26 ENCOUNTER — Other Ambulatory Visit

## 2024-04-26 ENCOUNTER — Ambulatory Visit
Admission: RE | Admit: 2024-04-26 | Discharge: 2024-04-26 | Disposition: A | Discharge status: Home / Self Care | Source: Ambulatory Visit | Attending: Physician Assistant | Admitting: Physician Assistant

## 2024-04-26 ENCOUNTER — Ambulatory Visit (HOSPITAL_COMMUNITY)

## 2024-04-26 ENCOUNTER — Inpatient Hospital Stay

## 2024-04-26 ENCOUNTER — Encounter (HOSPITAL_COMMUNITY): Payer: Self-pay

## 2024-04-26 DIAGNOSIS — J439 Emphysema, unspecified: Secondary | ICD-10-CM | POA: Diagnosis not present

## 2024-04-26 DIAGNOSIS — I700 Atherosclerosis of aorta: Secondary | ICD-10-CM | POA: Diagnosis not present

## 2024-04-26 DIAGNOSIS — C3490 Malignant neoplasm of unspecified part of unspecified bronchus or lung: Secondary | ICD-10-CM

## 2024-04-26 MED ORDER — IOPAMIDOL (ISOVUE-300) INJECTION 61%
75.0000 mL | Freq: Once | INTRAVENOUS | Status: AC | PRN
  Administered 2024-04-26: 75 mL via INTRAVENOUS

## 2024-05-04 ENCOUNTER — Ambulatory Visit: Payer: Medicare Other | Admitting: Internal Medicine

## 2024-05-04 ENCOUNTER — Inpatient Hospital Stay: Attending: Internal Medicine | Admitting: Internal Medicine

## 2024-05-04 ENCOUNTER — Inpatient Hospital Stay

## 2024-05-04 VITALS — BP 133/73 | HR 60 | Temp 97.3°F | Resp 16 | Ht 65.0 in | Wt 241.0 lb

## 2024-05-04 DIAGNOSIS — C3490 Malignant neoplasm of unspecified part of unspecified bronchus or lung: Secondary | ICD-10-CM

## 2024-05-04 DIAGNOSIS — F17200 Nicotine dependence, unspecified, uncomplicated: Secondary | ICD-10-CM | POA: Diagnosis not present

## 2024-05-04 DIAGNOSIS — C3412 Malignant neoplasm of upper lobe, left bronchus or lung: Secondary | ICD-10-CM | POA: Diagnosis not present

## 2024-05-04 LAB — CMP (CANCER CENTER ONLY)
ALT: 12 U/L (ref 0–44)
AST: 13 U/L — ABNORMAL LOW (ref 15–41)
Albumin: 4.1 g/dL (ref 3.5–5.0)
Alkaline Phosphatase: 72 U/L (ref 38–126)
Anion gap: 7 (ref 5–15)
BUN: 11 mg/dL (ref 6–20)
CO2: 27 mmol/L (ref 22–32)
Calcium: 9.1 mg/dL (ref 8.9–10.3)
Chloride: 103 mmol/L (ref 98–111)
Creatinine: 0.85 mg/dL (ref 0.44–1.00)
GFR, Estimated: >60 mL/min (ref >60)
Glucose, Bld: 103 mg/dL — ABNORMAL HIGH (ref 70–99)
Potassium: 3.8 mmol/L (ref 3.5–5.1)
Sodium: 137 mmol/L (ref 135–145)
Total Bilirubin: 0.4 mg/dL (ref 0.0–1.2)
Total Protein: 7.3 g/dL (ref 6.5–8.1)

## 2024-05-04 LAB — CBC WITH DIFFERENTIAL (CANCER CENTER ONLY)
Abs Immature Granulocytes: 0.01 K/uL (ref 0.00–0.07)
Basophils Absolute: 0 K/uL (ref 0.0–0.1)
Basophils Relative: 0 %
Eosinophils Absolute: 0.1 K/uL (ref 0.0–0.5)
Eosinophils Relative: 2 %
HCT: 43 % (ref 36.0–46.0)
Hemoglobin: 14.7 g/dL (ref 12.0–15.0)
Immature Granulocytes: 0 %
Lymphocytes Relative: 32 %
Lymphs Abs: 2.2 K/uL (ref 0.7–4.0)
MCH: 29.1 pg (ref 26.0–34.0)
MCHC: 34.2 g/dL (ref 30.0–36.0)
MCV: 85.1 fL (ref 80.0–100.0)
Monocytes Absolute: 0.4 K/uL (ref 0.1–1.0)
Monocytes Relative: 6 %
Neutro Abs: 4.1 K/uL (ref 1.7–7.7)
Neutrophils Relative %: 60 %
Platelet Count: 172 K/uL (ref 150–400)
RBC: 5.05 MIL/uL (ref 3.87–5.11)
RDW: 14.6 % (ref 11.5–15.5)
WBC Count: 6.9 K/uL (ref 4.0–10.5)
nRBC: 0 % (ref 0.0–0.2)

## 2024-05-04 NOTE — Progress Notes (Signed)
 [facility] [phone]   [phone]  OFFICE PROGRESS NOTE  Shannon Thresa BROCKS, PA-C [address] [place] [address]  DIAGNOSIS: Stage IA (T1b, N0, M0) non-small cell lung cancer, adenocarcinoma diagnosed in August 2024 and presented with lingular nodule   PRIOR THERAPY: Status post left upper lobe lingular segmentectomy in November 2024.  CURRENT THERAPY: Observation  INTERVAL HISTORY: Shannon [name] 57 y.o. female returns to the clinic today for follow-up visit. Discussed the use of AI scribe software for clinical note transcription with the patient, who gave verbal consent to proceed.  History of Present Illness Shannon [name] is a 58 year old female with lung cancer who presents for a repeat CT scan of the chest for restaging of her disease.  She was diagnosed with lung cancer and underwent surgery on August 10, 2023. Since then, she has been under observation and is here for a repeat CT scan to restage her disease.  She continues to smoke, attributing this to stress related to her husband's recent diagnosis of stage four lung cancer. Her husband, Marinda, has completed radiation treatment for his condition.  She feels physically terrible, which she attributes to other health issues, including crushed vertebrae in her back that require replacement. She also experiences persistent coughing.  Recent blood work, including CBC, shows normal hemoglobin and white blood count levels. She is awaiting results from her chemistry panel.     MEDICAL HISTORY: Past Medical History:  Diagnosis Date   Anxiety    Arthritis    oa needs hip replacement on right   Asthma    allergies    Bronchitis    Cancer (HCC)    squamous cell areas removed from vulva and pre caner areas removed from leg    Carotid artery occlusion    left   Complication of anesthesia    major depression after general anesthesia   Coronary artery disease    LHC  11/25/12 90% stenosis mid LCx & otherwise nonobstructive dz w/ EF 60-65% S/p PTCA/DES to LCx   Depression    Dyspnea    with exertion    Elevated cholesterol    Exertional dyspnea    chronic   Fibromyalgia    GERD (gastroesophageal reflux disease)    History of cardiovascular stress test 06/2015   low risk   History of hiatal hernia    HPV in female    Hyperlipidemia    Hypertension    Liver abscess 09/09/2021   MI (myocardial infarction) (HCC) fe 17, 2014   Obesity    Pneumonia    hx of x 2 years ago    Polycystic ovary disease    Skin tear of upper arm without complication, left, sequela    small skin tear left upper arm no drainage for 1 week   Sleep apnea    mild no cpap    Tobacco abuse     ALLERGIES:  is allergic to clomiphene citrate, doxycycline, morphine and codeine, other, sulfa antibiotics, and clomiphene.  MEDICATIONS:  Current Outpatient Medications  Medication Sig Dispense Refill   acetaminophen  (TYLENOL ) 500 MG tablet Take 500 mg by mouth every 6 (six) hours as needed for moderate pain (pain score 4-6).     albuterol  (PROVENTIL  HFA;VENTOLIN  HFA) 108 (90 Base) MCG/ACT inhaler Inhale 2 puffs into the lungs every 4 (four) hours as needed for wheezing or shortness of breath. 1 Inhaler 0   ALPRAZolam  (XANAX ) 1 MG  tablet Take 1 mg by mouth 2 (two) times daily as needed for anxiety.  2   aspirin  EC 81 MG tablet Take 1 tablet (81 mg total) by mouth daily. Swallow whole. (Patient taking differently: Take 81 mg by mouth at bedtime. Swallow whole.) 30 tablet 11   bismuth subsalicylate (PEPTO BISMOL) 262 MG chewable tablet Chew 524 mg by mouth as needed for diarrhea or loose stools.     bisoprolol -hydrochlorothiazide  (ZIAC ) 5-6.25 MG tablet Take 1 tablet by mouth at bedtime.     buPROPion  (WELLBUTRIN  XL) 150 MG 24 hr tablet Take 150 mg by mouth daily.     escitalopram  (LEXAPRO ) 20 MG tablet Take 20 mg by mouth at bedtime.     fluticasone  (FLONASE ) 50 MCG/ACT nasal spray  Place 2 sprays into both nostrils at bedtime.     furosemide  (LASIX ) 20 MG tablet Take 1 tablet (20 mg total) by mouth daily as needed. 90 tablet 3   nitroGLYCERIN  (NITROSTAT ) 0.4 MG SL tablet Place 0.4 mg under the tongue every 5 (five) minutes as needed for chest pain.     oxyCODONE  (OXY IR/ROXICODONE ) 5 MG immediate release tablet Take 1 tablet (5 mg total) by mouth every 6 (six) hours as needed for severe pain (pain score 7-10). 30 tablet 0   pantoprazole  (PROTONIX ) 40 MG tablet Take 40 mg by mouth at bedtime.     rosuvastatin  (CRESTOR ) 10 MG tablet Take 10 mg by mouth at bedtime.     triamcinolone  cream (KENALOG ) 0.5 % Apply 1 Application topically 2 (two) times daily as needed (rash).     No current facility-administered medications for this visit.    SURGICAL HISTORY:  Past Surgical History:  Procedure Laterality Date   CORONARY ANGIOPLASTY WITH STENT PLACEMENT     INTERCOSTAL NERVE BLOCK Left 08/10/2023   Procedure: INTERCOSTAL NERVE BLOCK;  Surgeon: Kerrin Elspeth BROCKS, MD;  [facility]: [facility] OR;  Service: Thoracic;  Laterality: Left;   LEFT HEART CATH AND CORONARY ANGIOGRAPHY N/A 08/09/2018   Procedure: LEFT HEART CATH AND CORONARY ANGIOGRAPHY;  Surgeon: Mady Bruckner, MD;  [facility] INVASIVE CV LAB;  Service: Cardiovascular;  Laterality: N/A;   LEFT HEART CATHETERIZATION WITH CORONARY ANGIOGRAM N/A 11/15/2012   Procedure: LEFT HEART CATHETERIZATION WITH CORONARY ANGIOGRAM;  Surgeon: Aleene JINNY Passe, MD;  Location: [facility] CATH LAB;  Service: Cardiovascular;  Laterality: N/A;   LYMPH NODE DISSECTION Left 08/10/2023   Procedure: LYMPH NODE DISSECTION;  Surgeon: Kerrin Elspeth BROCKS, MD;  Location: [facility] OR;  Service: Thoracic;  Laterality: Left;   PERCUTANEOUS CORONARY STENT INTERVENTION (PCI-S)  11/15/2012   Procedure: PERCUTANEOUS CORONARY STENT INTERVENTION (PCI-S);  Surgeon: Aleene JINNY Passe, MD;  Location: [facility] CATH LAB;  Service: Cardiovascular;;   skin cancer removed right lower leg  Right    also removed from left leg and chest area   TONSILLECTOMY     TOTAL HIP ARTHROPLASTY Right 01/16/2021   Procedure: TOTAL HIP ARTHROPLASTY ANTERIOR APPROACH;  Surgeon: Melodi Lerner, MD;  Location: [facility] ORS;  Service: Orthopedics;  Laterality: Right;    uterus ablation     VULVECTOMY N/A 11/08/2019   Procedure: excision of VULVA, vulvoscopy;  Surgeon: Mat Browning, MD;  Location: [facility] [place];  Service: Gynecology;  Laterality: N/A;   XI ROBOTIC ASSISTED THORACOSCOPY- SEGMENTECTOMY Left 08/10/2023   Procedure: XI ROBOTIC ASSISTED THORACOSCOPY- LEFT UPPER LINGULAR SEGMENTECTOMY;  Surgeon: Kerrin Elspeth BROCKS, MD;  Location: [facility] OR;  Service: Thoracic;  Laterality: Left;    REVIEW OF SYSTEMS:  A comprehensive review of systems was negative except for: Constitutional: positive for fatigue Respiratory: positive for cough Musculoskeletal: positive for arthralgias   PHYSICAL EXAMINATION: General appearance: alert, cooperative, fatigued, and no distress Head: Normocephalic, without obvious abnormality, atraumatic Neck: no adenopathy, no JVD, supple, symmetrical, trachea midline, and thyroid  not enlarged, symmetric, no tenderness/mass/nodules Lymph nodes: Cervical, supraclavicular, and axillary nodes normal. Resp: clear to auscultation bilaterally Back: symmetric, no curvature. ROM normal. No CVA tenderness. Cardio: regular rate and rhythm, S1, S2 normal, no murmur, click, rub or gallop GI: soft, non-tender; bowel sounds normal; no masses,  no organomegaly Extremities: extremities normal, atraumatic, no cyanosis or edema  ECOG PERFORMANCE STATUS: 1 - Symptomatic but completely ambulatory  Last menstrual period 07/01/2015.  LABORATORY DATA: Lab Results  Component Value Date   WBC 7.9 08/12/2023   HGB 12.5 08/12/2023   HCT 38.7 08/12/2023   MCV 90.6 08/12/2023   PLT 126 (L) 08/12/2023      Chemistry      Component Value Date/Time   NA 135 08/12/2023  0216   K 3.6 08/12/2023 0216   CL 102 08/12/2023 0216   CO2 26 08/12/2023 0216   BUN 8 08/12/2023 0216   CREATININE 1.01 (H) 08/12/2023 0216   CREATININE 0.88 06/10/2023 1404   CREATININE 0.89 10/16/2021 1122      Component Value Date/Time   CALCIUM  8.3 (L) 08/12/2023 0216   ALKPHOS 50 08/12/2023 0216   AST 15 08/12/2023 0216   AST 14 (L) 06/10/2023 1404   ALT 11 08/12/2023 0216   ALT 18 06/10/2023 1404   BILITOT 0.6 08/12/2023 0216   BILITOT 0.4 06/10/2023 1404       RADIOGRAPHIC STUDIES: CT Chest W Contrast Result Date: 04/26/2024 CLINICAL DATA:  Non-small-cell lung cancer restaging, assess treatment response, status post segmentectomy * Tracking Code: BO * EXAM: CT CHEST WITH CONTRAST TECHNIQUE: Multidetector CT imaging of the chest was performed during intravenous contrast administration. RADIATION DOSE REDUCTION: This exam was performed according to the departmental dose-optimization program which includes automated exposure control, adjustment of the mA and/or kV according to patient size and/or use of iterative reconstruction technique. CONTRAST:  75mL ISOVUE -300 IOPAMIDOL  (ISOVUE -300) INJECTION 61% COMPARISON:  PET-CT, 05/26/2023 FINDINGS: Cardiovascular: Aortic atherosclerosis. Normal heart size. Three-vessel coronary artery calcifications. No pericardial effusion. Mediastinum/Nodes: No enlarged mediastinal, hilar, or axillary lymph nodes. Thyroid  gland, trachea, and esophagus demonstrate no significant findings. Lungs/Pleura: Status post lingular wedge resection. Small left pleural effusion. Minimal paraseptal emphysema with diffuse bilateral bronchial wall thickening and background of fine centrilobular nodularity throughout the lungs, most concentrated in the lung apices. Unchanged small pulmonary nodules, for example a 0.4 cm nodule of the peripheral left lower lobe (series 4, image 113) and a 0.3 cm subpleural nodule of the lateral segment right middle lobe (series 4, image  109). Upper Abdomen: No acute abnormality. Unchanged small benign left adrenal adenomata as well as non nodular adenomatous hypertrophy of the adrenal glands, requiring no further follow-up or characterization. Musculoskeletal: No chest wall abnormality. No acute osseous findings. IMPRESSION: 1. Status post lingular wedge resection. 2. No evidence of recurrent or metastatic disease in the chest. 3. Unchanged small pulmonary nodules, nonspecific and likely benign. 4. Minimal emphysema and smoking-related respiratory bronchiolitis. 5. Coronary artery disease. Aortic Atherosclerosis (ICD10-I70.0) and Emphysema (ICD10-J43.9). Electronically Signed   By: Marolyn JONETTA Jaksch M.D.   On: 04/26/2024 16:14    ASSESSMENT AND PLAN: This is a very pleasant 58 years old white female with stage Ia (T1b, N0, M0)  non-small cell lung cancer, adenocarcinoma diagnosed in August 2024 and presented with lingular nodule status post left upper lobe lingular segmentectomy on November 2024. Assessment and Plan Assessment & Plan History of early stage lung cancer, post-surgical, under surveillance Diagnosed in November 2024 as early stage. Post-surgical surveillance shows no evidence of disease recurrence or progression. Recent CT scan of the chest and CBC are normal. - Continue surveillance with CT scans every six months for the first two years post-surgery. - Order lab work and scan before the next visit.  Tobacco use disorder Continues to smoke despite her lung cancer diagnosis and her husband's stage four lung cancer diagnosis. Stress related to her husband's illness is a barrier to quitting. - Encourage smoking cessation. The patient was advised to call immediately if she has any other concerning symptoms in the interval. The patient voices understanding of current disease status and treatment options and is in agreement with the current care plan.  All questions were answered. The patient knows to call the clinic with any  problems, questions or concerns. We can certainly see the patient much sooner if necessary.  The total time spent in the appointment was 20 minutes including review of chart and various tests results, discussions about plan of care and coordination of care plan .   Disclaimer: This note was dictated with voice recognition software. Similar sounding words can inadvertently be transcribed and may not be corrected upon review.

## 2024-05-06 ENCOUNTER — Telehealth: Payer: Self-pay | Admitting: Internal Medicine

## 2024-05-06 NOTE — Telephone Encounter (Signed)
Scheduled appointments with the patient

## 2024-05-31 DIAGNOSIS — M546 Pain in thoracic spine: Secondary | ICD-10-CM | POA: Diagnosis not present

## 2024-06-16 NOTE — Therapy (Incomplete)
 OUTPATIENT PHYSICAL THERAPY THORACOLUMBAR EVALUATION   Patient Name: Shannon [name] MRN: [id number] DOB:Oct 26, 1965, 58 y.o., female Today's Date: 06/16/2024  END OF SESSION:   Past Medical History:  Diagnosis Date   Anxiety    Arthritis    oa needs hip replacement on right   Asthma    allergies    Bronchitis    Cancer (HCC)    squamous cell areas removed from vulva and pre caner areas removed from leg    Carotid artery occlusion    left   Complication of anesthesia    major depression after general anesthesia   Coronary artery disease    LHC 11/25/12 90% stenosis mid LCx & otherwise nonobstructive dz w/ EF 60-65% S/p PTCA/DES to LCx   Depression    Dyspnea    with exertion    Elevated cholesterol    Exertional dyspnea    chronic   Fibromyalgia    GERD (gastroesophageal reflux disease)    History of cardiovascular stress test 06/2015   low risk   History of hiatal hernia    HPV in female    Hyperlipidemia    Hypertension    Liver abscess 09/09/2021   MI (myocardial infarction) (HCC) fe 17, 2014   Obesity    Pneumonia    hx of x 2 years ago    Polycystic ovary disease    Skin tear of upper arm without complication, left, sequela    small skin tear left upper arm no drainage for 1 week   Sleep apnea    mild no cpap    Tobacco abuse    Past Surgical History:  Procedure Laterality Date   CORONARY ANGIOPLASTY WITH STENT PLACEMENT     INTERCOSTAL NERVE BLOCK Left 08/10/2023   Procedure: INTERCOSTAL NERVE BLOCK;  Surgeon: Kerrin Elspeth BROCKS, MD;  Location: [facility] OR;  Service: Thoracic;  Laterality: Left;   LEFT HEART CATH AND CORONARY ANGIOGRAPHY N/A 08/09/2018   Procedure: LEFT HEART CATH AND CORONARY ANGIOGRAPHY;  Surgeon: Mady Bruckner, MD;  Location: [facility] INVASIVE CV LAB;  Service: Cardiovascular;  Laterality: N/A;   LEFT HEART CATHETERIZATION WITH CORONARY ANGIOGRAM N/A 11/15/2012   Procedure: LEFT HEART CATHETERIZATION WITH CORONARY ANGIOGRAM;  Surgeon: Aleene JINNY Passe, MD;  Location: [facility] CATH LAB;  Service: Cardiovascular;  Laterality: N/A;   LYMPH NODE DISSECTION Left 08/10/2023   Procedure: LYMPH NODE DISSECTION;  Surgeon: Kerrin Elspeth BROCKS, MD;  Location: [facility] OR;  Service: Thoracic;  Laterality: Left;   PERCUTANEOUS CORONARY STENT INTERVENTION (PCI-S)  11/15/2012   Procedure: PERCUTANEOUS CORONARY STENT INTERVENTION (PCI-S);  Surgeon: Aleene JINNY Passe, MD;  Location: [facility] CATH LAB;  Service: Cardiovascular;;   skin cancer removed right lower leg Right    also removed from left leg and chest area   TONSILLECTOMY     TOTAL HIP ARTHROPLASTY Right 01/16/2021   Procedure: TOTAL HIP ARTHROPLASTY ANTERIOR APPROACH;  Surgeon: Melodi Lerner, MD;  Location: [facility] ORS;  Service: Orthopedics;  Laterality: Right;    uterus ablation     VULVECTOMY N/A 11/08/2019   Procedure: excision of VULVA, vulvoscopy;  Surgeon: Mat Browning, MD;  Location: [facility];  Service: Gynecology;  Laterality: N/A;   XI ROBOTIC ASSISTED THORACOSCOPY- SEGMENTECTOMY Left 08/10/2023   Procedure: XI ROBOTIC ASSISTED THORACOSCOPY- LEFT UPPER LINGULAR SEGMENTECTOMY;  Surgeon: Kerrin Elspeth BROCKS, MD;  Location: [facility] OR;  Service: Thoracic;  Laterality: Left;   Patient Active Problem List   Diagnosis Date Noted   Adenocarcinoma, lung (HCC)  10/26/2023   Adenocarcinoma of left lung, stage 1 (HCC) 08/13/2023   Preop cardiovascular exam 07/23/2023   Lung nodule 05/19/2023   Liver abscess 09/09/2021   DVT (deep venous thrombosis) (HCC) 09/03/2021   Bacteremia due to Klebsiella pneumoniae 08/18/2021   Chest pain 08/18/2021   Atypical chest pain 08/17/2021   Hypotension 08/17/2021   Thrombocytopenia (HCC) 08/17/2021   Hyperglycemia 08/17/2021   Hyponatremia 08/17/2021   Hypokalemia 08/17/2021   Elevated troponin 08/17/2021   Obesity (BMI 30-39.9) 08/17/2021   Asthma 08/17/2021   Anxiety 08/17/2021   Primary osteoarthritis of right hip 01/16/2021   Chronic  diastolic HF (heart failure) (HCC) 08/10/2018   GERD (gastroesophageal reflux disease) 08/09/2018   Unstable angina (HCC) 08/09/2018   Carotid artery disease (HCC) 07/24/2017   AAA (abdominal aortic aneurysm) without rupture (HCC) 01/05/2013   Other malaise and fatigue 01/05/2013   Palpitations 01/05/2013   Tobacco use disorder 12/02/2012   Coronary artery disease    Mixed hyperlipidemia    Essential hypertension    SHORTNESS OF BREATH 09/04/2009   CHEST PAIN 09/04/2009    PCP: Lanis Thresa BROCKS, PA-C  REFERRING PROVIDER: Onetha Kuba, MD  REFERRING DIAG: M54.6 (ICD-10-CM) - Pain in thoracic spine  Rationale for Evaluation and Treatment: Rehabilitation  THERAPY DIAG:  No diagnosis found.  ONSET DATE: ***  SUBJECTIVE:                                                                                                                                                                                           SUBJECTIVE STATEMENT: ***  PERTINENT HISTORY:  ***  PAIN:  Are you having pain? {OPRCPAIN:27236}  PRECAUTIONS: {Therapy precautions:24002}  RED FLAGS: {PT Red Flags:29287}   WEIGHT BEARING RESTRICTIONS: {Yes ***/No:24003}  FALLS:  Has patient fallen in last 6 months? {fallsyesno:27318}  LIVING ENVIRONMENT: Lives with: {OPRC lives with:25569::lives with their family} Lives in: {Lives in:25570} Stairs: {opstairs:27293} Has following equipment at home: {Assistive devices:23999}  OCCUPATION: ***  PLOF: {PLOF:24004}  PATIENT GOALS: ***  NEXT MD VISIT: ***  OBJECTIVE:  Note: Objective measures were completed at Evaluation unless otherwise noted.  DIAGNOSTIC FINDINGS:  ***  PATIENT SURVEYS:  Modified Oswestry:  MODIFIED OSWESTRY DISABILITY SCALE  Date: *** Score  Pain intensity {ODI 1:32962}  2. Personal care (washing, dressing, etc.) {ODI 2:32963}  3. Lifting {ODI 3:32964}  4. Walking {ODI 4:32965}  5. Sitting {ODI 5:32966}  6. Standing {ODI  6:32967}  7. Sleeping {ODI 7:32968}  8. Social Life {ODI 8:32969}  9. Traveling {ODI 9:32970}  10. Employment/ Homemaking {ODI [id number]  Total ***/50   Interpretation of scores: Score Category Description  0-20% Minimal Disability The patient can cope with most living activities. Usually no treatment is indicated apart from advice on lifting, sitting and exercise  21-40% Moderate Disability The patient experiences more pain and difficulty with sitting, lifting and standing. Travel and social life are more difficult and they may be disabled from work. Personal care, sexual activity and sleeping are not grossly affected, and the patient can usually be managed by conservative means  41-60% Severe Disability Pain remains the main problem in this group, but activities of daily living are affected. These patients require a detailed investigation  61-80% Crippled Back pain impinges on all aspects of the patient's life. Positive intervention is required  81-100% Bed-bound  These patients are either bed-bound or exaggerating their symptoms  Bluford FORBES Zoe DELENA Karon DELENA, et al. Surgery versus conservative management of stable thoracolumbar fracture: the PRESTO feasibility RCT. [place] ([place]): [employer]; 2021 Nov. [facility] Technology Assessment, No. 25.62.) Appendix 3, Oswestry Disability Index category descriptors. Available from: [employer]el[employer]cz  Minimally Clinically Important Difference (MCID) = 12.8%  COGNITION: Overall cognitive status: {cognition:24006}     SENSATION: {sensation:27233}  MUSCLE LENGTH: Hamstrings: Right *** deg; Left *** deg Debby test: Right *** deg; Left *** deg  POSTURE: {posture:25561}  PALPATION: ***  LUMBAR ROM:   AROM eval  Flexion   Extension   Right lateral flexion   Left lateral flexion   Right rotation   Left rotation    (Blank rows = not tested)  LOWER EXTREMITY ROM:     {AROM/PROM:27142}  Right eval  Left eval  Hip flexion    Hip extension    Hip abduction    Hip adduction    Hip internal rotation    Hip external rotation    Knee flexion    Knee extension    Ankle dorsiflexion    Ankle plantarflexion    Ankle inversion    Ankle eversion     (Blank rows = not tested)  LOWER EXTREMITY MMT:    MMT Right eval Left eval  Hip flexion    Hip extension    Hip abduction    Hip adduction    Hip internal rotation    Hip external rotation    Knee flexion    Knee extension    Ankle dorsiflexion    Ankle plantarflexion    Ankle inversion    Ankle eversion     (Blank rows = not tested)  LUMBAR SPECIAL TESTS:  {lumbar special test:25242}  FUNCTIONAL TESTS:  {Functional tests:24029}  GAIT: Distance walked: *** Assistive device utilized: {Assistive devices:23999} Level of assistance: {Levels of assistance:24026} Comments: ***  TREATMENT DATE: 06/16/24 physical therapy evaluation and HEP instruction                                                                                                                                 PATIENT EDUCATION:  Education details: Patient educated on exam findings, POC,  scope of PT, HEP, and ***. Person educated: Patient Education method: Explanation, Demonstration, and Handouts Education comprehension: verbalized understanding, returned demonstration, verbal cues required, and tactile cues required  HOME EXERCISE PROGRAM: ***  ASSESSMENT:  CLINICAL IMPRESSION: Patient is a 58 y.o. female who was seen today for physical therapy evaluation and treatment for M54.6 (ICD-10-CM) - Pain in thoracic spine.   OBJECTIVE IMPAIRMENTS: {opptimpairments:25111}.   ACTIVITY LIMITATIONS: {activitylimitations:27494}  PARTICIPATION LIMITATIONS: {participationrestrictions:25113}  PERSONAL FACTORS: {Personal factors:25162} are also affecting patient's functional outcome.   REHAB POTENTIAL: Good  CLINICAL DECISION MAKING: Evolving/moderate  complexity  EVALUATION COMPLEXITY: Moderate   GOALS: Goals reviewed with patient? {yes/no:20286}  SHORT TERM GOALS: Target date: ***  *** Baseline: Goal status: INITIAL  2.  *** Baseline:  Goal status: INITIAL  3.  *** Baseline:  Goal status: INITIAL  4.  *** Baseline:  Goal status: INITIAL  5.  *** Baseline:  Goal status: INITIAL  6.  *** Baseline:  Goal status: INITIAL  LONG TERM GOALS: Target date: ***  *** Baseline:  Goal status: INITIAL  2.  *** Baseline:  Goal status: INITIAL  3.  *** Baseline:  Goal status: INITIAL  4.  *** Baseline:  Goal status: INITIAL  5.  *** Baseline:  Goal status: INITIAL  6.  *** Baseline:  Goal status: INITIAL  PLAN:  PT FREQUENCY: {rehab frequency:25116}  PT DURATION: {rehab duration:25117}  PLANNED INTERVENTIONS: 97164- PT Re-evaluation, 97110-Therapeutic exercises, 97530- Therapeutic activity, 97112- Neuromuscular re-education, 97535- Self Care, 02859- Manual therapy, [id number]- Gait training, [id number]- Orthotic Fit/training, [id number]- Canalith repositioning, [id number]- Aquatic Therapy, 97760- Splinting, 97597- Wound care (first 20 sq cm), 97598- Wound care (each additional 20 sq cm)Patient/Family education, Balance training, Stair training, Taping, Dry Needling, Joint mobilization, Joint manipulation, Spinal manipulation, Spinal mobilization, Scar mobilization, and DME instructions. SABRA  PLAN FOR NEXT SESSION: Review HEP and goals;    2:59 PM, 06/16/24 Zuleica Seith Small Pratik Dalziel MPT [facility] [place] [phone]

## 2024-06-17 ENCOUNTER — Ambulatory Visit (HOSPITAL_COMMUNITY)

## 2024-06-20 NOTE — Therapy (Incomplete)
 OUTPATIENT PHYSICAL THERAPY THORACOLUMBAR EVALUATION   Patient Name: Shannon [name] MRN: [id number] DOB:1966/01/31, 58 y.o., female Today's Date: 06/20/2024  END OF SESSION:   Past Medical History:  Diagnosis Date   Anxiety    Arthritis    oa needs hip replacement on right   Asthma    allergies    Bronchitis    Cancer (HCC)    squamous cell areas removed from vulva and pre caner areas removed from leg    Carotid artery occlusion    left   Complication of anesthesia    major depression after general anesthesia   Coronary artery disease    LHC 11/25/12 90% stenosis mid LCx & otherwise nonobstructive dz w/ EF 60-65% S/p PTCA/DES to LCx   Depression    Dyspnea    with exertion    Elevated cholesterol    Exertional dyspnea    chronic   Fibromyalgia    GERD (gastroesophageal reflux disease)    History of cardiovascular stress test 06/2015   low risk   History of hiatal hernia    HPV in female    Hyperlipidemia    Hypertension    Liver abscess 09/09/2021   MI (myocardial infarction) (HCC) fe 17, 2014   Obesity    Pneumonia    hx of x 2 years ago    Polycystic ovary disease    Skin tear of upper arm without complication, left, sequela    small skin tear left upper arm no drainage for 1 week   Sleep apnea    mild no cpap    Tobacco abuse    Past Surgical History:  Procedure Laterality Date   CORONARY ANGIOPLASTY WITH STENT PLACEMENT     INTERCOSTAL NERVE BLOCK Left 08/10/2023   Procedure: INTERCOSTAL NERVE BLOCK;  Surgeon: Kerrin Elspeth BROCKS, MD;  Location: [facility] OR;  Service: Thoracic;  Laterality: Left;   LEFT HEART CATH AND CORONARY ANGIOGRAPHY N/A 08/09/2018   Procedure: LEFT HEART CATH AND CORONARY ANGIOGRAPHY;  Surgeon: Mady Bruckner, MD;  Location: [facility] INVASIVE CV LAB;  Service: Cardiovascular;  Laterality: N/A;   LEFT HEART CATHETERIZATION WITH CORONARY ANGIOGRAM N/A 11/15/2012   Procedure: LEFT HEART CATHETERIZATION WITH CORONARY ANGIOGRAM;  Surgeon: Aleene JINNY Passe, MD;  Location: [facility] CATH LAB;  Service: Cardiovascular;  Laterality: N/A;   LYMPH NODE DISSECTION Left 08/10/2023   Procedure: LYMPH NODE DISSECTION;  Surgeon: Kerrin Elspeth BROCKS, MD;  Location: [facility] OR;  Service: Thoracic;  Laterality: Left;   PERCUTANEOUS CORONARY STENT INTERVENTION (PCI-S)  11/15/2012   Procedure: PERCUTANEOUS CORONARY STENT INTERVENTION (PCI-S);  Surgeon: Aleene JINNY Passe, MD;  Location: [facility] CATH LAB;  Service: Cardiovascular;;   skin cancer removed right lower leg Right    also removed from left leg and chest area   TONSILLECTOMY     TOTAL HIP ARTHROPLASTY Right 01/16/2021   Procedure: TOTAL HIP ARTHROPLASTY ANTERIOR APPROACH;  Surgeon: Melodi Lerner, MD;  Location: [facility] ORS;  Service: Orthopedics;  Laterality: Right;    uterus ablation     VULVECTOMY N/A 11/08/2019   Procedure: excision of VULVA, vulvoscopy;  Surgeon: Mat Browning, MD;  Location: [facility] [place];  Service: Gynecology;  Laterality: N/A;   XI ROBOTIC ASSISTED THORACOSCOPY- SEGMENTECTOMY Left 08/10/2023   Procedure: XI ROBOTIC ASSISTED THORACOSCOPY- LEFT UPPER LINGULAR SEGMENTECTOMY;  Surgeon: Kerrin Elspeth BROCKS, MD;  Location: [facility] OR;  Service: Thoracic;  Laterality: Left;   Patient Active Problem List   Diagnosis Date Noted   Adenocarcinoma, lung (HCC)  10/26/2023   Adenocarcinoma of left lung, stage 1 (HCC) 08/13/2023   Preop cardiovascular exam 07/23/2023   Lung nodule 05/19/2023   Liver abscess 09/09/2021   DVT (deep venous thrombosis) (HCC) 09/03/2021   Bacteremia due to Klebsiella pneumoniae 08/18/2021   Chest pain 08/18/2021   Atypical chest pain 08/17/2021   Hypotension 08/17/2021   Thrombocytopenia (HCC) 08/17/2021   Hyperglycemia 08/17/2021   Hyponatremia 08/17/2021   Hypokalemia 08/17/2021   Elevated troponin 08/17/2021   Obesity (BMI 30-39.9) 08/17/2021   Asthma 08/17/2021   Anxiety 08/17/2021   Primary osteoarthritis of right hip 01/16/2021   Chronic  diastolic HF (heart failure) (HCC) 08/10/2018   GERD (gastroesophageal reflux disease) 08/09/2018   Unstable angina (HCC) 08/09/2018   Carotid artery disease (HCC) 07/24/2017   AAA (abdominal aortic aneurysm) without rupture (HCC) 01/05/2013   Other malaise and fatigue 01/05/2013   Palpitations 01/05/2013   Tobacco use disorder 12/02/2012   Coronary artery disease    Mixed hyperlipidemia    Essential hypertension    SHORTNESS OF BREATH 09/04/2009   CHEST PAIN 09/04/2009    PCP: Lanis Thresa BROCKS, PA-C  REFERRING PROVIDER: Onetha Kuba, MD  REFERRING DIAG: M54.6 (ICD-10-CM) - Pain in thoracic spine  Rationale for Evaluation and Treatment: Rehabilitation  THERAPY DIAG:  No diagnosis found.  ONSET DATE: ***  SUBJECTIVE:                                                                                                                                                                                           SUBJECTIVE STATEMENT: ***  PERTINENT HISTORY:  ***  PAIN:  Are you having pain? {OPRCPAIN:27236}  PRECAUTIONS: {Therapy precautions:24002}  RED FLAGS: {PT Red Flags:29287}   WEIGHT BEARING RESTRICTIONS: {Yes ***/No:24003}  FALLS:  Has patient fallen in last 6 months? {fallsyesno:27318}  LIVING ENVIRONMENT: Lives with: {OPRC lives with:25569::lives with their family} Lives in: {Lives in:25570} Stairs: {opstairs:27293} Has following equipment at home: {Assistive devices:23999}  OCCUPATION: ***  PLOF: {PLOF:24004}  PATIENT GOALS: ***  NEXT MD VISIT: ***  OBJECTIVE:  Note: Objective measures were completed at Evaluation unless otherwise noted.  DIAGNOSTIC FINDINGS:  ***  PATIENT SURVEYS:  Modified Oswestry:  MODIFIED OSWESTRY DISABILITY SCALE  Date: *** Score  Pain intensity {ODI 1:32962}  2. Personal care (washing, dressing, etc.) {ODI 2:32963}  3. Lifting {ODI 3:32964}  4. Walking {ODI 4:32965}  5. Sitting {ODI 5:32966}  6. Standing {ODI  6:32967}  7. Sleeping {ODI 7:32968}  8. Social Life {ODI 8:32969}  9. Traveling {ODI 9:32970}  10. Employment/ Homemaking {ODI [id number]  Total ***/50   Interpretation of scores: Score Category Description  0-20% Minimal Disability The patient can cope with most living activities. Usually no treatment is indicated apart from advice on lifting, sitting and exercise  21-40% Moderate Disability The patient experiences more pain and difficulty with sitting, lifting and standing. Travel and social life are more difficult and they may be disabled from work. Personal care, sexual activity and sleeping are not grossly affected, and the patient can usually be managed by conservative means  41-60% Severe Disability Pain remains the main problem in this group, but activities of daily living are affected. These patients require a detailed investigation  61-80% Crippled Back pain impinges on all aspects of the patient's life. Positive intervention is required  81-100% Bed-bound  These patients are either bed-bound or exaggerating their symptoms  Bluford FORBES Zoe DELENA Karon DELENA, et al. Surgery versus conservative management of stable thoracolumbar fracture: the PRESTO feasibility RCT. [place] ([place]): [employer]; 2021 Nov. [facility] [employer] Assessment, No. 25.62.) Appendix 3, Oswestry Disability Index category descriptors. Available from: [employer]cz  Minimally Clinically Important Difference (MCID) = 12.8%  COGNITION: Overall cognitive status: {cognition:24006}     SENSATION: {sensation:27233}  MUSCLE LENGTH: Hamstrings: Right *** deg; Left *** deg Debby test: Right *** deg; Left *** deg  POSTURE: {posture:25561}  PALPATION: ***  LUMBAR ROM:   AROM eval  Flexion   Extension   Right lateral flexion   Left lateral flexion   Right rotation   Left rotation    (Blank rows = not tested)  LOWER EXTREMITY ROM:     {AROM/PROM:27142}  Right eval  Left eval  Hip flexion    Hip extension    Hip abduction    Hip adduction    Hip internal rotation    Hip external rotation    Knee flexion    Knee extension    Ankle dorsiflexion    Ankle plantarflexion    Ankle inversion    Ankle eversion     (Blank rows = not tested)  LOWER EXTREMITY MMT:    MMT Right eval Left eval  Hip flexion    Hip extension    Hip abduction    Hip adduction    Hip internal rotation    Hip external rotation    Knee flexion    Knee extension    Ankle dorsiflexion    Ankle plantarflexion    Ankle inversion    Ankle eversion     (Blank rows = not tested)  LUMBAR SPECIAL TESTS:  {lumbar special test:25242}  FUNCTIONAL TESTS:  {Functional tests:24029}  GAIT: Distance walked: *** Assistive device utilized: {Assistive devices:23999} Level of assistance: {Levels of assistance:24026} Comments: ***  TREATMENT DATE: 06/21/24 physical therapy evaluation and HEP instruction                                                                                                                                 PATIENT EDUCATION:  Education details: Patient educated on exam findings, POC,  scope of PT, HEP, and ***. Person educated: Patient Education method: Explanation, Demonstration, and Handouts Education comprehension: verbalized understanding, returned demonstration, verbal cues required, and tactile cues required  HOME EXERCISE PROGRAM: ***  ASSESSMENT:  CLINICAL IMPRESSION: Patient is a 58 y.o. female who was seen today for physical therapy evaluation and treatment for M54.6 (ICD-10-CM) - Pain in thoracic spine.   OBJECTIVE IMPAIRMENTS: {opptimpairments:25111}.   ACTIVITY LIMITATIONS: {activitylimitations:27494}  PARTICIPATION LIMITATIONS: {participationrestrictions:25113}  PERSONAL FACTORS: {Personal factors:25162} are also affecting patient's functional outcome.   REHAB POTENTIAL: Good  CLINICAL DECISION MAKING: Evolving/moderate  complexity  EVALUATION COMPLEXITY: Moderate   GOALS: Goals reviewed with patient? No  SHORT TERM GOALS: Target date: ***  patient will be independent with initial HEP  Baseline: Goal status: INITIAL  2.  Patient will report 50% improvement overall  Baseline:  Goal status: INITIAL  3.  *** Baseline:  Goal status: INITIAL  4.  *** Baseline:  Goal status: INITIAL  5.  *** Baseline:  Goal status: INITIAL  6.  *** Baseline:  Goal status: INITIAL  LONG TERM GOALS: Target date: ***  Patient will be independent in self management strategies to improve quality of life and functional outcomes.  Baseline:  Goal status: INITIAL  2.  Patient will report 70% improvement overall  Baseline:  Goal status: INITIAL  3.  *** Baseline:  Goal status: INITIAL  4.  *** Baseline:  Goal status: INITIAL  5.  *** Baseline:  Goal status: INITIAL  6.  *** Baseline:  Goal status: INITIAL  PLAN:  PT FREQUENCY: {rehab frequency:25116}  PT DURATION: {rehab duration:25117}  PLANNED INTERVENTIONS: 97164- PT Re-evaluation, 97110-Therapeutic exercises, 97530- Therapeutic activity, 97112- Neuromuscular re-education, 97535- Self Care, 02859- Manual therapy, [id number]- Gait training, [id number]- Orthotic Fit/training, [id number]- Canalith repositioning, [id number]- Aquatic Therapy, [id number]- Splinting, [id number]- Wound care (first 20 sq cm), 97598- Wound care (each additional 20 sq cm)Patient/Family education, Balance training, Stair training, Taping, Dry Needling, Joint mobilization, Joint manipulation, Spinal manipulation, Spinal mobilization, Scar mobilization, and DME instructions. SABRA  PLAN FOR NEXT SESSION: Review of HEP and goals    7:22 AM, 06/21/24 Bladen Umar Small Ryken Paschal MPT [place] [facility] [place] [phone]

## 2024-06-21 ENCOUNTER — Encounter (HOSPITAL_COMMUNITY)

## 2024-06-24 ENCOUNTER — Encounter (HOSPITAL_COMMUNITY)

## 2024-06-29 ENCOUNTER — Encounter (HOSPITAL_COMMUNITY)

## 2024-07-06 NOTE — Therapy (Signed)
 OUTPATIENT PHYSICAL THERAPY THORACOLUMBAR EVALUATION   Patient Name: Shannon [name] MRN: [id number] DOB:1966/01/02, 58 y.o., female Today's Date: 07/07/2024  END OF SESSION:  PT End of Session - 07/07/24 1515     Visit Number 1    Number of Visits 8    Date for Recertification  08/04/24    Authorization Type BCBS Medicare    Authorization Time Period seeking auth    PT Start Time 0315   late check in   PT Stop Time 0345    PT Time Calculation (min) 30 min    Activity Tolerance Patient tolerated treatment well    Behavior During Therapy [facility] for tasks assessed/performed          Past Medical History:  Diagnosis Date   Anxiety    Arthritis    oa needs hip replacement on right   Asthma    allergies    Bronchitis    Cancer (HCC)    squamous cell areas removed from vulva and pre caner areas removed from leg    Carotid artery occlusion    left   Complication of anesthesia    major depression after general anesthesia   Coronary artery disease    LHC 11/25/12 90% stenosis mid LCx & otherwise nonobstructive dz w/ EF 60-65% S/p PTCA/DES to LCx   Depression    Dyspnea    with exertion    Elevated cholesterol    Exertional dyspnea    chronic   Fibromyalgia    GERD (gastroesophageal reflux disease)    History of cardiovascular stress test 06/2015   low risk   History of hiatal hernia    HPV in female    Hyperlipidemia    Hypertension    Liver abscess 09/09/2021   MI (myocardial infarction) (HCC) fe 17, 2014   Obesity    Pneumonia    hx of x 2 years ago    Polycystic ovary disease    Skin tear of upper arm without complication, left, sequela    small skin tear left upper arm no drainage for 1 week   Sleep apnea    mild no cpap    Tobacco abuse    Past Surgical History:  Procedure Laterality Date   CORONARY ANGIOPLASTY WITH STENT PLACEMENT     INTERCOSTAL NERVE BLOCK Left 08/10/2023   Procedure: INTERCOSTAL NERVE BLOCK;  Surgeon: Kerrin Elspeth BROCKS, MD;   Location: [facility] OR;  Service: Thoracic;  Laterality: Left;   LEFT HEART CATH AND CORONARY ANGIOGRAPHY N/A 08/09/2018   Procedure: LEFT HEART CATH AND CORONARY ANGIOGRAPHY;  Surgeon: Mady Bruckner, MD;  Location: [facility] INVASIVE CV LAB;  Service: Cardiovascular;  Laterality: N/A;   LEFT HEART CATHETERIZATION WITH CORONARY ANGIOGRAM N/A 11/15/2012   Procedure: LEFT HEART CATHETERIZATION WITH CORONARY ANGIOGRAM;  Surgeon: Aleene JINNY Passe, MD;  Location: [facility] CATH LAB;  Service: Cardiovascular;  Laterality: N/A;   LYMPH NODE DISSECTION Left 08/10/2023   Procedure: LYMPH NODE DISSECTION;  Surgeon: Kerrin Elspeth BROCKS, MD;  Location: [facility] OR;  Service: Thoracic;  Laterality: Left;   PERCUTANEOUS CORONARY STENT INTERVENTION (PCI-S)  11/15/2012   Procedure: PERCUTANEOUS CORONARY STENT INTERVENTION (PCI-S);  Surgeon: Aleene JINNY Passe, MD;  Location: [facility] CATH LAB;  Service: Cardiovascular;;   skin cancer removed right lower leg Right    also removed from left leg and chest area   TONSILLECTOMY     TOTAL HIP ARTHROPLASTY Right 01/16/2021   Procedure: TOTAL HIP ARTHROPLASTY ANTERIOR APPROACH;  Surgeon: Melodi Lerner, MD;  Location: [facility] ORS;  Service: Orthopedics;  Laterality: Right;    uterus ablation     VULVECTOMY N/A 11/08/2019   Procedure: excision of VULVA, vulvoscopy;  Surgeon: Mat Browning, MD;  Location: [facility];  Service: Gynecology;  Laterality: N/A;   XI ROBOTIC ASSISTED THORACOSCOPY- SEGMENTECTOMY Left 08/10/2023   Procedure: XI ROBOTIC ASSISTED THORACOSCOPY- LEFT UPPER LINGULAR SEGMENTECTOMY;  Surgeon: Kerrin Elspeth BROCKS, MD;  Location: [facility] OR;  Service: Thoracic;  Laterality: Left;   Patient Active Problem List   Diagnosis Date Noted   Adenocarcinoma, lung (HCC) 10/26/2023   Adenocarcinoma of left lung, stage 1 (HCC) 08/13/2023   Preop cardiovascular exam 07/23/2023   Lung nodule 05/19/2023   Liver abscess 09/09/2021   DVT (deep venous thrombosis) (HCC) 09/03/2021    Bacteremia due to Klebsiella pneumoniae 08/18/2021   Chest pain 08/18/2021   Atypical chest pain 08/17/2021   Hypotension 08/17/2021   Thrombocytopenia 08/17/2021   Hyperglycemia 08/17/2021   Hyponatremia 08/17/2021   Hypokalemia 08/17/2021   Elevated troponin 08/17/2021   Obesity (BMI 30-39.9) 08/17/2021   Asthma 08/17/2021   Anxiety 08/17/2021   Primary osteoarthritis of right hip 01/16/2021   Chronic diastolic HF (heart failure) (HCC) 08/10/2018   GERD (gastroesophageal reflux disease) 08/09/2018   Unstable angina (HCC) 08/09/2018   Carotid artery disease 07/24/2017   AAA (abdominal aortic aneurysm) without rupture 01/05/2013   Other malaise and fatigue 01/05/2013   Palpitations 01/05/2013   Tobacco use disorder 12/02/2012   Coronary artery disease    Mixed hyperlipidemia    Essential hypertension    SHORTNESS OF BREATH 09/04/2009   CHEST PAIN 09/04/2009    PCP: Lanis Thresa MOTE PA-C  REFERRING PROVIDER: Onetha Kuba, MD  REFERRING DIAG: M54.6 (ICD-10-CM) - Pain in thoracic spineM54.6 (ICD-10-CM) - Pain in thoracic spine  Rationale for Evaluation and Treatment: Rehabilitation  THERAPY DIAG:  Pain in thoracic spine  Low back pain, unspecified back pain laterality, unspecified chronicity, unspecified whether sciatica present  Muscle weakness (generalized)  Difficulty in walking, not elsewhere classified  ONSET DATE: chronic  SUBJECTIVE:                                                                                                                                                                                           SUBJECTIVE STATEMENT: Patient with back pain; patient here before for back pain last year; has had a lot going on; had lung cancer surgery Nov 11th last year; clear ever since; also caring for husband.  Limping due to needing THA left hip.  Saw Dr. Onetha who wanted to do shots and the ablation; patient hesitant  about; referred to therapy to try  dry needling.   PERTINENT HISTORY:  History of lung cancer; clear with last testing Needs left THA; seeing Dr. Melodi In Nov.   Taking care of husband who has stage 4 lung cancer  PAIN:  Are you having pain? Yes: NPRS scale: 9/10 Pain location: mid back; some in low back Pain description: dull ache Aggravating factors: prolonged standing, yardwork, lifting Relieving factors: heat  PRECAUTIONS: None  RED FLAGS: None   WEIGHT BEARING RESTRICTIONS: No  FALLS:  Has patient fallen in last 6 months? No   OCCUPATION: on disability  PLOF: Independent  PATIENT GOALS: to get out of some of this pain; learn to manage pain  NEXT MD VISIT: PRN  OBJECTIVE:  Note: Objective measures were completed at Evaluation unless otherwise noted.  DIAGNOSTIC FINDINGS:  IMPRESSION: 1. Multilevel degeneration in the thoracic spine, particularly advanced at the disc spaces of T6-7 to T10-11 where there is mild degenerative edema. 2. Up to mild spinal stenosis at T10-11 without cord compression. Mild-to-moderate right foraminal narrowing at T9-10 and T10-11.     Electronically Signed   By: Dorn Roulette M.D.   On: 11/11/2023 06:13  PATIENT SURVEYS:  Modified Oswestry:  MODIFIED OSWESTRY DISABILITY SCALE  Date: 07/07/2024 Score                                Total 30/50; 60%   Interpretation of scores: Score Category Description  0-20% Minimal Disability The patient can cope with most living activities. Usually no treatment is indicated apart from advice on lifting, sitting and exercise  21-40% Moderate Disability The patient experiences more pain and difficulty with sitting, lifting and standing. Travel and social life are more difficult and they may be disabled from work. Personal care, sexual activity and sleeping are not grossly affected, and the patient can usually be managed by conservative means  41-60% Severe Disability Pain remains the main problem in this group, but  activities of daily living are affected. These patients require a detailed investigation  61-80% Crippled Back pain impinges on all aspects of the patient's life. Positive intervention is required  81-100% Bed-bound These patients are either bed-bound or exaggerating their symptoms  Bluford FORBES Zoe DELENA Karon DELENA, et al. Surgery versus conservative management of stable thoracolumbar fracture: the PRESTO feasibility RCT. [place]): [employer]; 2021 Nov. [facility] Technology Assessment, No. 25.62.) Appendix 3, Oswestry Disability Index category descriptors. Available from: [employer]cz  Minimally Clinically Important Difference (MCID) = 12.8%  COGNITION: Overall cognitive status: Within functional limits for tasks assessed     SENSATION: WFL  MUSCLE LENGTH: POSTURE: rounded shoulders, forward head, and increased thoracic kyphosis  PALPATION: Right shoulder slightly elevated   LUMBAR ROM:   AROM eval  Flexion Fingertips to top of patella pulling  Extension Pain ; 15% available  Right lateral flexion   Left lateral flexion   Right rotation   Left rotation    (Blank rows = not tested)  LOWER EXTREMITY ROM:     Active  Right eval Left eval  Hip flexion    Hip extension    Hip abduction    Hip adduction    Hip internal rotation    Hip external rotation    Knee flexion    Knee extension    Ankle dorsiflexion    Ankle plantarflexion    Ankle inversion    Ankle eversion     (  Blank rows = not tested)  LOWER EXTREMITY MMT:    MMT Right eval Left eval  Hip flexion 4+ 3-  Hip extension    Hip abduction    Hip adduction    Hip internal rotation    Hip external rotation    Knee flexion    Knee extension 5 4+  Ankle dorsiflexion 5 4+  Ankle plantarflexion    Ankle inversion    Ankle eversion     (Blank rows = not tested)   FUNCTIONAL TESTS:  5 times sit to stand: 24.37 sec using hands to assist up to  standing  GAIT: Distance walked: 50 ft Assistive device utilized: None Level of assistance: Modified independence Comments: antalgic gait  TREATMENT DATE: 07/07/24 physical therapy evaluation and HEP intruction                                                                                                                                 PATIENT EDUCATION:  Education details: Patient educated on exam findings, POC, scope of PT, HEP, and what to expect next visit . Person educated: Patient Education method: Explanation, Demonstration, and Handouts Education comprehension: verbalized understanding, returned demonstration, verbal cues required, and tactile cues required  HOME EXERCISE PROGRAM: Next visit  ASSESSMENT:  CLINICAL IMPRESSION: Patient is a 59 y.o. female who was seen today for physical therapy evaluation and treatment for M54.6 (ICD-10-CM) - Pain in thoracic spine.  Patient demonstrates muscle weakness, reduced ROM, and fascial restrictions which are likely contributing to symptoms of pain and are negatively impacting patient ability to perform ADLs and functional mobility tasks. Patient will benefit from skilled physical therapy services to address these deficits to reduce pain and improve level of function with ADLs and functional mobility tasks.   OBJECTIVE IMPAIRMENTS: Abnormal gait, decreased activity tolerance, decreased ROM, decreased strength, impaired perceived functional ability, and pain.   ACTIVITY LIMITATIONS: carrying, lifting, bending, sitting, standing, sleeping, and locomotion level  PARTICIPATION LIMITATIONS: meal prep, cleaning, laundry, shopping, and community activity  PERSONAL FACTORS: needs left hip replacement are also affecting patient's functional outcome.   REHAB POTENTIAL: Good  CLINICAL DECISION MAKING: Evolving/moderate complexity  EVALUATION COMPLEXITY: Moderate   GOALS: Goals reviewed with patient? No  SHORT TERM GOALS: Target date:  07/21/2024  patient will be independent with initial HEP  Baseline: Goal status: INITIAL  2.  Patient will report 30% improvement overall  Baseline:  Goal status: INITIAL LONG TERM GOALS: Target date: 08/04/2024  Patient will be independent in self management strategies to improve quality of life and functional outcomes.  Baseline:  Goal status: INITIAL  2.  Patient will report 50% improvement overall  Baseline:  Goal status: INITIAL  3.  Patient will improve Modified Oswestry score by 10  points to demonstrate improved perceived function  Baseline: 30/50 Goal status: INITIAL  4.  Patient will improve 5 times sit to stand score to 15 sec or less to demonstrate  improved functional mobility and increased leg strength.     Baseline: 24.37 sec Goal status: INITIAL  PLAN:  PT FREQUENCY: 2x/week  PT DURATION: 4 weeks  PLANNED INTERVENTIONS: 97164- PT Re-evaluation, 97110-Therapeutic exercises, 97530- Therapeutic activity, 97112- Neuromuscular re-education, 97535- Self Care, 02859- Manual therapy, [id number]- Gait training, [phone]- Orthotic Fit/training, [phone]- Canalith repositioning, [id number]- Aquatic Therapy, [phone]- Splinting, [phone]- Wound care (first 20 sq cm), 97598- Wound care (each additional 20 sq cm)Patient/Family education, Balance training, Stair training, Taping, Dry Needling, Joint mobilization, Joint manipulation, Spinal manipulation, Spinal mobilization, Scar mobilization, and DME instructions. SABRA  PLAN FOR NEXT SESSION: initiate HEP and review goals; dry needling as appropriate; decompression exericises    3:57 PM, Jul 18, 2024 Kyros Salzwedel Small Eliah Marquard MPT [place] [facility] [place] [phone] [phone]   Managed Medicaid Authorization Request Treatment Start Date: 07-18-24  Visit Dx Codes: M54.6, M54.50, M62.81, R26.2  Functional Tool Score: Modified Oswestry 30/50; 60%  For all possible CPT codes, reference the Planned Interventions line above.     Check all  conditions that are expected to impact treatment: {Conditions expected to impact treatment:None of these apply   If treatment provided at initial evaluation, no treatment charged due to lack of authorization.

## 2024-07-07 ENCOUNTER — Ambulatory Visit (HOSPITAL_COMMUNITY)

## 2024-07-07 ENCOUNTER — Other Ambulatory Visit: Payer: Self-pay

## 2024-07-07 DIAGNOSIS — M545 Low back pain, unspecified: Secondary | ICD-10-CM | POA: Insufficient documentation

## 2024-07-07 DIAGNOSIS — M6281 Muscle weakness (generalized): Secondary | ICD-10-CM | POA: Diagnosis not present

## 2024-07-07 DIAGNOSIS — R262 Difficulty in walking, not elsewhere classified: Secondary | ICD-10-CM | POA: Insufficient documentation

## 2024-07-07 DIAGNOSIS — M546 Pain in thoracic spine: Secondary | ICD-10-CM | POA: Insufficient documentation

## 2024-07-07 NOTE — Patient Instructions (Signed)

## 2024-07-08 ENCOUNTER — Encounter (HOSPITAL_COMMUNITY)

## 2024-07-12 ENCOUNTER — Ambulatory Visit (HOSPITAL_COMMUNITY)

## 2024-07-12 ENCOUNTER — Telehealth (HOSPITAL_COMMUNITY): Payer: Self-pay

## 2024-07-12 ENCOUNTER — Encounter (HOSPITAL_COMMUNITY): Payer: Self-pay

## 2024-07-12 NOTE — Telephone Encounter (Signed)
 No show #1; called patient regarding missed appointment today; unable to leave a message due to voicemail box being full.  2:44 PM, 07/12/24 Neilani Duffee Small Kynzi Levay MPT [place] [facility] [place] [phone]

## 2024-07-15 ENCOUNTER — Telehealth (HOSPITAL_COMMUNITY): Payer: Self-pay

## 2024-07-15 ENCOUNTER — Encounter (HOSPITAL_COMMUNITY)

## 2024-07-15 NOTE — Telephone Encounter (Signed)
 Spoke with patient on the phone; she states she has had a lot going on at home with her husband and herself not feeling well.  She was under the impression she had to dry needle but PT explained that this was not mandatory for her to attend PT.  She verbalizes understanding and states she plans to come in for her next appt 07/19/24 at 3:00 or will call if she cannot make it.  2:44 PM, 07/15/24 Martavia Tye Small Chauncy Mangiaracina MPT [place] [facility] [place] [phone] Ph:[phone]

## 2024-07-19 ENCOUNTER — Ambulatory Visit (HOSPITAL_COMMUNITY)

## 2024-07-19 DIAGNOSIS — M546 Pain in thoracic spine: Secondary | ICD-10-CM

## 2024-07-19 DIAGNOSIS — R262 Difficulty in walking, not elsewhere classified: Secondary | ICD-10-CM

## 2024-07-19 DIAGNOSIS — M545 Low back pain, unspecified: Secondary | ICD-10-CM

## 2024-07-19 DIAGNOSIS — M6281 Muscle weakness (generalized): Secondary | ICD-10-CM | POA: Diagnosis not present

## 2024-07-19 NOTE — Patient Instructions (Signed)

## 2024-07-19 NOTE — Therapy (Signed)
 OUTPATIENT PHYSICAL THERAPY THORACOLUMBAR TREATMENT   Patient Name: Shannon [name] MRN: [id number] DOB:1965/12/26, 58 y.o., female Today's Date: 07/19/2024  END OF SESSION:  PT End of Session - 07/19/24 1506     Visit Number 2    Number of Visits 8    Date for Recertification  08/04/24    Authorization Type BCBS Medicare    Authorization Time Period no auth required; $10 copay    PT Start Time 1505    PT Stop Time 1545    PT Time Calculation (min) 40 min    Activity Tolerance Patient tolerated treatment well    Behavior During Therapy WFL for tasks assessed/performed          Past Medical History:  Diagnosis Date   Anxiety    Arthritis    oa needs hip replacement on right   Asthma    allergies    Bronchitis    Cancer (HCC)    squamous cell areas removed from vulva and pre caner areas removed from leg    Carotid artery occlusion    left   Complication of anesthesia    major depression after general anesthesia   Coronary artery disease    LHC 11/25/12 90% stenosis mid LCx & otherwise nonobstructive dz w/ EF 60-65% S/p PTCA/DES to LCx   Depression    Dyspnea    with exertion    Elevated cholesterol    Exertional dyspnea    chronic   Fibromyalgia    GERD (gastroesophageal reflux disease)    History of cardiovascular stress test 06/2015   low risk   History of hiatal hernia    HPV in female    Hyperlipidemia    Hypertension    Liver abscess 09/09/2021   MI (myocardial infarction) (HCC) fe 17, 2014   Obesity    Pneumonia    hx of x 2 years ago    Polycystic ovary disease    Skin tear of upper arm without complication, left, sequela    small skin tear left upper arm no drainage for 1 week   Sleep apnea    mild no cpap    Tobacco abuse    Past Surgical History:  Procedure Laterality Date   CORONARY ANGIOPLASTY WITH STENT PLACEMENT     INTERCOSTAL NERVE BLOCK Left 08/10/2023   Procedure: INTERCOSTAL NERVE BLOCK;  Surgeon: Kerrin Elspeth BROCKS, MD;   Location: [facility] OR;  Service: Thoracic;  Laterality: Left;   LEFT HEART CATH AND CORONARY ANGIOGRAPHY N/A 08/09/2018   Procedure: LEFT HEART CATH AND CORONARY ANGIOGRAPHY;  Surgeon: Mady Bruckner, MD;  Location: [facility] INVASIVE CV LAB;  Service: Cardiovascular;  Laterality: N/A;   LEFT HEART CATHETERIZATION WITH CORONARY ANGIOGRAM N/A 11/15/2012   Procedure: LEFT HEART CATHETERIZATION WITH CORONARY ANGIOGRAM;  Surgeon: Aleene JINNY Passe, MD;  Location: [facility] CATH LAB;  Service: Cardiovascular;  Laterality: N/A;   LYMPH NODE DISSECTION Left 08/10/2023   Procedure: LYMPH NODE DISSECTION;  Surgeon: Kerrin Elspeth BROCKS, MD;  Location: [facility] OR;  Service: Thoracic;  Laterality: Left;   PERCUTANEOUS CORONARY STENT INTERVENTION (PCI-S)  11/15/2012   Procedure: PERCUTANEOUS CORONARY STENT INTERVENTION (PCI-S);  Surgeon: Aleene JINNY Passe, MD;  Location: [facility] CATH LAB;  Service: Cardiovascular;;   skin cancer removed right lower leg Right    also removed from left leg and chest area   TONSILLECTOMY     TOTAL HIP ARTHROPLASTY Right 01/16/2021   Procedure: TOTAL HIP ARTHROPLASTY ANTERIOR APPROACH;  Surgeon: Melodi Lerner, MD;  Location:  [facility] ORS;  Service: Orthopedics;  Laterality: Right;    uterus ablation     VULVECTOMY N/A 11/08/2019   Procedure: excision of VULVA, vulvoscopy;  Surgeon: Mat Browning, MD;  Location: [facility] [place];  Service: Gynecology;  Laterality: N/A;   XI ROBOTIC ASSISTED THORACOSCOPY- SEGMENTECTOMY Left 08/10/2023   Procedure: XI ROBOTIC ASSISTED THORACOSCOPY- LEFT UPPER LINGULAR SEGMENTECTOMY;  Surgeon: Kerrin Elspeth BROCKS, MD;  Location: [facility] OR;  Service: Thoracic;  Laterality: Left;   Patient Active Problem List   Diagnosis Date Noted   Adenocarcinoma, lung (HCC) 10/26/2023   Adenocarcinoma of left lung, stage 1 (HCC) 08/13/2023   Preop cardiovascular exam 07/23/2023   Lung nodule 05/19/2023   Liver abscess 09/09/2021   DVT (deep venous thrombosis) (HCC) 09/03/2021    Bacteremia due to Klebsiella pneumoniae 08/18/2021   Chest pain 08/18/2021   Atypical chest pain 08/17/2021   Hypotension 08/17/2021   Thrombocytopenia 08/17/2021   Hyperglycemia 08/17/2021   Hyponatremia 08/17/2021   Hypokalemia 08/17/2021   Elevated troponin 08/17/2021   Obesity (BMI 30-39.9) 08/17/2021   Asthma 08/17/2021   Anxiety 08/17/2021   Primary osteoarthritis of right hip 01/16/2021   Chronic diastolic HF (heart failure) (HCC) 08/10/2018   GERD (gastroesophageal reflux disease) 08/09/2018   Unstable angina (HCC) 08/09/2018   Carotid artery disease 07/24/2017   AAA (abdominal aortic aneurysm) without rupture 01/05/2013   Other malaise and fatigue 01/05/2013   Palpitations 01/05/2013   Tobacco use disorder 12/02/2012   Coronary artery disease    Mixed hyperlipidemia    Essential hypertension    SHORTNESS OF BREATH 09/04/2009   CHEST PAIN 09/04/2009    PCP: Lanis Thresa MOTE PA-C  REFERRING PROVIDER: Onetha Kuba, MD  REFERRING DIAG: M54.6 (ICD-10-CM) - Pain in thoracic spineM54.6 (ICD-10-CM) - Pain in thoracic spine  Rationale for Evaluation and Treatment: Rehabilitation  THERAPY DIAG:  Pain in thoracic spine  Low back pain, unspecified back pain laterality, unspecified chronicity, unspecified whether sciatica present  Muscle weakness (generalized)  Difficulty in walking, not elsewhere classified  ONSET DATE: chronic  SUBJECTIVE:                                                                                                                                                                                           SUBJECTIVE STATEMENT: 6/10 mid to low back pain today; wants to try dry needling today.    Eval: Patient with back pain; patient here before for back pain last year; has had a lot going on; had lung cancer surgery Nov 11th last year; clear ever since; also caring for husband.  Limping due to needing THA  left hip.  Saw Dr. Onetha who wanted to do  shots and the ablation; patient hesitant about; referred to therapy to try dry needling.   PERTINENT HISTORY:  History of lung cancer; clear with last testing Needs left THA; seeing Dr. Melodi In Nov.   Taking care of husband who has stage 4 lung cancer  PAIN:  Are you having pain? Yes: NPRS scale: 9/10 Pain location: mid back; some in low back Pain description: dull ache Aggravating factors: prolonged standing, yardwork, lifting Relieving factors: heat  PRECAUTIONS: None  RED FLAGS: None   WEIGHT BEARING RESTRICTIONS: No  FALLS:  Has patient fallen in last 6 months? No   OCCUPATION: on disability  PLOF: Independent  PATIENT GOALS: to get out of some of this pain; learn to manage pain  NEXT MD VISIT: PRN  OBJECTIVE:  Note: Objective measures were completed at Evaluation unless otherwise noted.  DIAGNOSTIC FINDINGS:  IMPRESSION: 1. Multilevel degeneration in the thoracic spine, particularly advanced at the disc spaces of T6-7 to T10-11 where there is mild degenerative edema. 2. Up to mild spinal stenosis at T10-11 without cord compression. Mild-to-moderate right foraminal narrowing at T9-10 and T10-11.     Electronically Signed   By: Dorn Roulette M.D.   On: 11/11/2023 06:13  PATIENT SURVEYS:  Modified Oswestry:  MODIFIED OSWESTRY DISABILITY SCALE  Date: 07/07/2024 Score                                Total 30/50; 60%   Interpretation of scores: Score Category Description  0-20% Minimal Disability The patient can cope with most living activities. Usually no treatment is indicated apart from advice on lifting, sitting and exercise  21-40% Moderate Disability The patient experiences more pain and difficulty with sitting, lifting and standing. Travel and social life are more difficult and they may be disabled from work. Personal care, sexual activity and sleeping are not grossly affected, and the patient can usually be managed by conservative means   41-60% Severe Disability Pain remains the main problem in this group, but activities of daily living are affected. These patients require a detailed investigation  61-80% Crippled Back pain impinges on all aspects of the patient's life. Positive intervention is required  81-100% Bed-bound These patients are either bed-bound or exaggerating their symptoms  Bluford FORBES Zoe DELENA Karon DELENA, et al. Surgery versus conservative management of stable thoracolumbar fracture: the PRESTO feasibility RCT. [place] ([place]): [employer]; 2021 Nov. [facility] Technology Assessment, No. 25.62.) Appendix 3, Oswestry Disability Index category descriptors. Available from: Find[employer]el[employer].cz  Minimally Clinically Important Difference (MCID) = 12.8%  COGNITION: Overall cognitive status: Within functional limits for tasks assessed     SENSATION: WFL  MUSCLE LENGTH: POSTURE: rounded shoulders, forward head, and increased thoracic kyphosis  PALPATION: Right shoulder slightly elevated   LUMBAR ROM:   AROM eval  Flexion Fingertips to top of patella pulling  Extension Pain ; 15% available  Right lateral flexion   Left lateral flexion   Right rotation   Left rotation    (Blank rows = not tested)  LOWER EXTREMITY ROM:     Active  Right eval Left eval  Hip flexion    Hip extension    Hip abduction    Hip adduction    Hip internal rotation    Hip external rotation    Knee flexion    Knee extension    Ankle dorsiflexion  Ankle plantarflexion    Ankle inversion    Ankle eversion     (Blank rows = not tested)  LOWER EXTREMITY MMT:    MMT Right eval Left eval  Hip flexion 4+ 3-  Hip extension    Hip abduction    Hip adduction    Hip internal rotation    Hip external rotation    Knee flexion    Knee extension 5 4+  Ankle dorsiflexion 5 4+  Ankle plantarflexion    Ankle inversion    Ankle eversion     (Blank rows = not tested)   FUNCTIONAL TESTS:   5 times sit to stand: 24.37 sec using hands to assist up to standing  GAIT: Distance walked: 50 ft Assistive device utilized: None Level of assistance: Modified independence Comments: antalgic gait  TREATMENT DATE:  07/19/24  Review of goals Initiated HEP Seated  Thoracic extension x 10 Scapular retraction 5 x 10 Supine: Lateral trunk rotation x 10 Abdominal bracing 5 hold x 10 Trigger Point Dry Needling  Initial Treatment: Pt instructed on Dry Needling rational, procedures, and possible side effects. Pt instructed to expect mild to moderate muscle soreness later in the day and/or into the next day.  Pt instructed in methods to reduce muscle soreness. Pt instructed to continue prescribed HEP. Because Dry Needling was performed over or adjacent to a lung field, pt was educated on S/S of pneumothorax and to seek immediate medical attention should they occur.  Patient was educated on signs and symptoms of infection and other risk factors and advised to seek medical attention should they occur.  Patient verbalized understanding of these instructions and education.   Patient Verbal Consent Given: Yes Education Handout Provided: Yes Muscles Treated: thoracic multifidus Electrical Stimulation Performed: No Treatment Response/Outcome: left side twitch     07/07/24 physical therapy evaluation and HEP intruction                                                                                                                                 PATIENT EDUCATION:  Education details: Patient educated on exam findings, POC, scope of PT, HEP, and what to expect next visit . Person educated: Patient Education method: Explanation, Demonstration, and Handouts Education comprehension: verbalized understanding, returned demonstration, verbal cues required, and tactile cues required  HOME EXERCISE PROGRAM: Access Code: [email or web address] URL: [email or web address] Date:  07/19/2024 Prepared by: [facility] - Rehab  Exercises - Seated Thoracic Lumbar Extension  - 2 x daily - 7 x weekly - 1 sets - 10 reps - Supine Lower Trunk Rotation  - 2 x daily - 7 x weekly - 1 sets - 10 reps - Seated Scapular Retraction  - 2 x daily - 7 x weekly - 1 sets - 10 reps - 5 sec hold - Supine Transversus Abdominis Bracing - Hands on Stomach  - 2 x daily - 7 x weekly - 1 sets - 10 reps - 5  sec hold ASSESSMENT:  CLINICAL IMPRESSION: Today's session started with a review of goals; patient verbalizes agreement with goals.  Initiated HEP today.  Issued patient dry needling instruction handout and HEP handout.  Wants to try dry needling today;  very tender and tight bilateral thoracic multifidus.  Dry needling performed noted with 2 good twitches left side and notable decreased tightness after treatment.  Patient will benefit from continued skilled therapy services to address deficits and promote return to optimal function.      Eval: Patient is a 58 y.o. female who was seen today for physical therapy evaluation and treatment for M54.6 (ICD-10-CM) - Pain in thoracic spine.  Patient demonstrates muscle weakness, reduced ROM, and fascial restrictions which are likely contributing to symptoms of pain and are negatively impacting patient ability to perform ADLs and functional mobility tasks. Patient will benefit from skilled physical therapy services to address these deficits to reduce pain and improve level of function with ADLs and functional mobility tasks.   OBJECTIVE IMPAIRMENTS: Abnormal gait, decreased activity tolerance, decreased ROM, decreased strength, impaired perceived functional ability, and pain.   ACTIVITY LIMITATIONS: carrying, lifting, bending, sitting, standing, sleeping, and locomotion level  PARTICIPATION LIMITATIONS: meal prep, cleaning, laundry, shopping, and community activity  PERSONAL FACTORS: needs left hip replacement are also affecting patient's functional outcome.    REHAB POTENTIAL: Good  CLINICAL DECISION MAKING: Evolving/moderate complexity  EVALUATION COMPLEXITY: Moderate   GOALS: Goals reviewed with patient? No  SHORT TERM GOALS: Target date: 07/21/2024  patient will be independent with initial HEP  Baseline: Goal status: in progress  2.  Patient will report 30% improvement overall  Baseline:  Goal status: In progress LONG TERM GOALS: Target date: 08/04/2024  Patient will be independent in self management strategies to improve quality of life and functional outcomes.  Baseline:  Goal status: in progress   2.  Patient will report 50% improvement overall  Baseline:  Goal status: in progress  3.  Patient will improve Modified Oswestry score by 10  points to demonstrate improved perceived function  Baseline: 30/50 Goal status: In progress  4.  Patient will improve 5 times sit to stand score to 15 sec or less to demonstrate improved functional mobility and increased leg strength.     Baseline: 24.37 sec Goal status: in progress  PLAN:  PT FREQUENCY: 2x/week  PT DURATION: 4 weeks  PLANNED INTERVENTIONS: 97164- PT Re-evaluation, 97110-Therapeutic exercises, 97530- Therapeutic activity, 97112- Neuromuscular re-education, 97535- Self Care, [id number]- Manual therapy, [id number]- Gait training, [phone]- Orthotic Fit/training, [phone]- Canalith repositioning, [id number]- Aquatic Therapy, [phone]- Splinting, [phone]- Wound care (first 20 sq cm), 97598- Wound care (each additional 20 sq cm)Patient/Family education, Balance training, Stair training, Taping, Dry Needling, Joint mobilization, Joint manipulation, Spinal manipulation, Spinal mobilization, Scar mobilization, and DME instructions. SABRA  PLAN FOR NEXT SESSION:  dry needling as appropriate; decompression exericises    3:53 PM, 07/19/24 Makita Blow Small Munachimso Palin MPT [facility] [place] [phone] Ph:[phone]

## 2024-07-27 ENCOUNTER — Encounter (HOSPITAL_COMMUNITY)

## 2024-07-29 DIAGNOSIS — G894 Chronic pain syndrome: Secondary | ICD-10-CM | POA: Diagnosis not present

## 2024-07-29 DIAGNOSIS — E559 Vitamin D deficiency, unspecified: Secondary | ICD-10-CM | POA: Diagnosis not present

## 2024-07-29 DIAGNOSIS — I119 Hypertensive heart disease without heart failure: Secondary | ICD-10-CM | POA: Diagnosis not present

## 2024-07-29 DIAGNOSIS — E782 Mixed hyperlipidemia: Secondary | ICD-10-CM | POA: Diagnosis not present

## 2024-07-29 DIAGNOSIS — F411 Generalized anxiety disorder: Secondary | ICD-10-CM | POA: Diagnosis not present

## 2024-08-31 ENCOUNTER — Ambulatory Visit (HOSPITAL_COMMUNITY)

## 2024-10-18 ENCOUNTER — Encounter: Payer: Self-pay | Admitting: Internal Medicine

## 2024-10-24 ENCOUNTER — Other Ambulatory Visit

## 2024-10-25 ENCOUNTER — Inpatient Hospital Stay

## 2024-11-01 ENCOUNTER — Inpatient Hospital Stay: Admitting: Internal Medicine

## 2024-11-02 ENCOUNTER — Inpatient Hospital Stay: Admission: RE | Admit: 2024-11-02 | Source: Ambulatory Visit

## 2024-11-09 ENCOUNTER — Other Ambulatory Visit
